# Patient Record
Sex: Male | Born: 1940 | Race: White | Hispanic: No | Marital: Married | Smoking: Former smoker
Health system: Southern US, Community
[De-identification: ages and names within clinical notes are randomized; demographics above are authoritative.]

## PROBLEM LIST (undated history)

## (undated) DIAGNOSIS — I6529 Occlusion and stenosis of unspecified carotid artery: Secondary | ICD-10-CM

## (undated) DIAGNOSIS — I493 Ventricular premature depolarization: Secondary | ICD-10-CM

## (undated) DIAGNOSIS — E785 Hyperlipidemia, unspecified: Secondary | ICD-10-CM

## (undated) DIAGNOSIS — I350 Nonrheumatic aortic (valve) stenosis: Secondary | ICD-10-CM

## (undated) DIAGNOSIS — Z950 Presence of cardiac pacemaker: Secondary | ICD-10-CM

## (undated) DIAGNOSIS — G4733 Obstructive sleep apnea (adult) (pediatric): Secondary | ICD-10-CM

## (undated) DIAGNOSIS — E876 Hypokalemia: Secondary | ICD-10-CM

## (undated) DIAGNOSIS — E669 Obesity, unspecified: Secondary | ICD-10-CM

## (undated) DIAGNOSIS — K219 Gastro-esophageal reflux disease without esophagitis: Secondary | ICD-10-CM

## (undated) DIAGNOSIS — I10 Essential (primary) hypertension: Secondary | ICD-10-CM

## (undated) DIAGNOSIS — E119 Type 2 diabetes mellitus without complications: Secondary | ICD-10-CM

## (undated) DIAGNOSIS — I2510 Atherosclerotic heart disease of native coronary artery without angina pectoris: Secondary | ICD-10-CM

## (undated) DIAGNOSIS — R7301 Impaired fasting glucose: Secondary | ICD-10-CM

## (undated) DIAGNOSIS — I480 Paroxysmal atrial fibrillation: Secondary | ICD-10-CM

## (undated) DIAGNOSIS — K922 Gastrointestinal hemorrhage, unspecified: Secondary | ICD-10-CM

## (undated) DIAGNOSIS — R040 Epistaxis: Secondary | ICD-10-CM

## (undated) DIAGNOSIS — N189 Chronic kidney disease, unspecified: Secondary | ICD-10-CM

## (undated) DIAGNOSIS — M1990 Unspecified osteoarthritis, unspecified site: Secondary | ICD-10-CM

## (undated) DIAGNOSIS — I519 Heart disease, unspecified: Secondary | ICD-10-CM

## (undated) DIAGNOSIS — R55 Syncope and collapse: Secondary | ICD-10-CM

## (undated) HISTORY — DX: Hypokalemia: E87.6

## (undated) HISTORY — DX: Impaired fasting glucose: R73.01

## (undated) HISTORY — DX: Paroxysmal atrial fibrillation: I48.0

## (undated) HISTORY — PX: CHOLECYSTECTOMY: SHX55

## (undated) HISTORY — DX: Obstructive sleep apnea (adult) (pediatric): G47.33

## (undated) HISTORY — DX: Type 2 diabetes mellitus without complications: E11.9

## (undated) HISTORY — DX: Gastro-esophageal reflux disease without esophagitis: K21.9

## (undated) HISTORY — DX: Unspecified osteoarthritis, unspecified site: M19.90

## (undated) HISTORY — DX: Syncope and collapse: R55

## (undated) HISTORY — DX: Hypomagnesemia: E83.42

## (undated) HISTORY — DX: Chronic kidney disease, unspecified: N18.9

## (undated) HISTORY — DX: Presence of cardiac pacemaker: Z95.0

## (undated) HISTORY — DX: Obesity, unspecified: E66.9

## (undated) HISTORY — DX: Heart disease, unspecified: I51.9

## (undated) HISTORY — DX: Ventricular premature depolarization: I49.3

## (undated) HISTORY — DX: Essential (primary) hypertension: I10

## (undated) HISTORY — DX: Occlusion and stenosis of unspecified carotid artery: I65.29

## (undated) HISTORY — DX: Atherosclerotic heart disease of native coronary artery without angina pectoris: I25.10

## (undated) HISTORY — DX: Hyperlipidemia, unspecified: E78.5

## (undated) HISTORY — DX: Nonrheumatic aortic (valve) stenosis: I35.0

---

## 1995-05-30 HISTORY — PX: ANGIOPLASTY: SHX39

## 1998-05-29 HISTORY — PX: KNEE ARTHROSCOPY: SUR90

## 2000-05-29 HISTORY — PX: PACEMAKER INSERTION: SHX728

## 2001-01-25 ENCOUNTER — Ambulatory Visit (HOSPITAL_COMMUNITY): Admission: RE | Admit: 2001-01-25 | Discharge: 2001-01-26 | Payer: Self-pay | Admitting: Cardiology

## 2001-02-04 ENCOUNTER — Ambulatory Visit (HOSPITAL_COMMUNITY): Admission: AD | Admit: 2001-02-04 | Discharge: 2001-02-04 | Payer: Self-pay | Admitting: Cardiology

## 2001-02-21 ENCOUNTER — Ambulatory Visit (HOSPITAL_COMMUNITY): Admission: RE | Admit: 2001-02-21 | Discharge: 2001-02-22 | Payer: Self-pay | Admitting: Cardiology

## 2001-02-22 ENCOUNTER — Encounter: Payer: Self-pay | Admitting: Cardiology

## 2002-01-14 ENCOUNTER — Ambulatory Visit (HOSPITAL_COMMUNITY): Admission: RE | Admit: 2002-01-14 | Discharge: 2002-01-14 | Payer: Self-pay | Admitting: *Deleted

## 2002-01-14 ENCOUNTER — Encounter: Payer: Self-pay | Admitting: *Deleted

## 2002-03-03 ENCOUNTER — Ambulatory Visit (HOSPITAL_COMMUNITY): Admission: RE | Admit: 2002-03-03 | Discharge: 2002-03-03 | Payer: Self-pay | Admitting: Gastroenterology

## 2010-12-20 ENCOUNTER — Encounter: Payer: Self-pay | Admitting: *Deleted

## 2010-12-21 ENCOUNTER — Ambulatory Visit (INDEPENDENT_AMBULATORY_CARE_PROVIDER_SITE_OTHER): Payer: Managed Care, Other (non HMO) | Admitting: Internal Medicine

## 2010-12-21 ENCOUNTER — Encounter: Payer: Self-pay | Admitting: Internal Medicine

## 2010-12-21 VITALS — BP 144/82 | HR 88 | Ht 68.0 in | Wt 282.0 lb

## 2010-12-21 DIAGNOSIS — I495 Sick sinus syndrome: Secondary | ICD-10-CM

## 2010-12-21 DIAGNOSIS — Z950 Presence of cardiac pacemaker: Secondary | ICD-10-CM

## 2010-12-21 DIAGNOSIS — I2510 Atherosclerotic heart disease of native coronary artery without angina pectoris: Secondary | ICD-10-CM

## 2010-12-21 DIAGNOSIS — R001 Bradycardia, unspecified: Secondary | ICD-10-CM | POA: Insufficient documentation

## 2010-12-21 DIAGNOSIS — I498 Other specified cardiac arrhythmias: Secondary | ICD-10-CM

## 2010-12-21 DIAGNOSIS — R55 Syncope and collapse: Secondary | ICD-10-CM | POA: Insufficient documentation

## 2010-12-21 LAB — PACEMAKER DEVICE OBSERVATION
BATTERY VOLTAGE: 2.6 V
BMOD-0005RV: 95 {beats}/min
RV LEAD IMPEDENCE PM: 738 Ohm

## 2010-12-21 NOTE — Progress Notes (Signed)
HPI: Brandon Klein is a 70 y.o. male Seen at the request of Dr. [name] for consideration of pacemaker generator replacement  He underwent pacemaker implantation in 2002 because of bradycardia attributed to vasovagal syncope. He has had no syncope since then.  He has a history of coronary disease and underwent  LAD stenting in 1997 or so and Cutting balloon angioplasty of the circumflex in 2002. Most recent assessment of left ventricular function by echo June 2012 and is normal.  Outside recoreds were reviewed   He denies significant problems with chest pain shortness of breath or peripheral edema. Current Outpatient Prescriptions  Medication Sig Dispense Refill  . amLODipine (NORVASC) 10 MG tablet Take 10 mg by mouth daily.        Marland Kitchen aspirin 325 MG tablet Take 325 mg by mouth daily.        Marland Kitchen atorvastatin (LIPITOR) 20 MG tablet Take 20 mg by mouth daily.        . cholecalciferol (GNP VITAMIN D) 400 UNITS TABS Take 400 Units by mouth daily.        . fexofenadine (ALLEGRA) 180 MG tablet Take 180 mg by mouth daily.        . fluticasone (FLONASE) 50 MCG/ACT nasal spray Place 2 sprays into the nose daily.        . Glucosamine-Chondroit-Vit C-Mn (GLUCOSAMINE CHONDR 1500 COMPLX) CAPS Take by mouth.        Marland Kitchen ibuprofen (ADVIL,MOTRIN) 200 MG tablet Take 200 mg by mouth every 6 (six) hours as needed.        Marland Kitchen L-Lysine 500 MG CAPS Take 1 capsule by mouth daily.        . meclizine (ANTIVERT) 25 MG tablet Take 25 mg by mouth 3 (three) times daily as needed.        . Multiple Vitamin (MULTIVITAMIN) tablet Take 1 tablet by mouth daily.        . nitroGLYCERIN (NITROSTAT) 0.4 MG SL tablet Place 0.4 mg under the tongue every 5 (five) minutes as needed.        Marland Kitchen omega-3 acid ethyl esters (LOVAZA) 1 G capsule Take 2 g by mouth 2 (two) times daily.        . pantoprazole (PROTONIX) 40 MG tablet Take 40 mg by mouth daily.        . potassium chloride SA (K-DUR,KLOR-CON) 20 MEQ tablet Take 20 mEq by mouth daily.         . trandolapril (MAVIK) 4 MG tablet Take 4 mg by mouth daily.        Marland Kitchen triamterene-hydrochlorothiazide (MAXZIDE) 75-50 MG per tablet Take 1 tablet by mouth daily.          Allergies  Allergen Reactions  . Amoxicillin   . Diovan (Valsartan)     Past Medical History  Diagnosis Date  . Coronary artery disease     s/p angioplasty, 2 vessel, PTCA/LCX 12/2000, NL Cardiolite 10/08 (Dr. Amil Amen) Bradycardia  . Bradycardia   . Pacemaker   . Obesity   . HTN (hypertension)   . HLD (hyperlipidemia)   . DJD (degenerative joint disease)   . GERD (gastroesophageal reflux disease)   . Dizziness   . Impaired fasting glucose     A1C check every year    Past Surgical History  Procedure Date  . Cholecystectomy   . Knee arthroscopy 2000    Left  . Angioplasty 1997  . Pacemaker insertion 2002    Family History  Problem Relation Age  of Onset  . Leukemia Father     deceased  . Colon cancer Mother     deceased  . Heart attack Mother   . Heart attack Brother     deceased    History   Social History  . Marital Status: Married    Spouse Name: N/A    Number of Children: N/A  . Years of Education: N/A   Occupational History  . Not on file.   Social History Main Topics  . Smoking status: Former Smoker    Quit date: 05/29/1966  . Smokeless tobacco: Not on file  . Alcohol Use: Not on file  . Drug Use: Not on file  . Sexually Active: Not on file   Other Topics Concern  . Not on file   Social History Narrative  . No narrative on file    Fourteen point review of systems was negative except as noted in HPI and PMH   PHYSICAL EXAMINATION  Blood pressure 144/82, pulse 88, height 5\' 8"  (1.727 m), weight 282 lb (127.914 kg).   Well developed and nourished obese older male in no acute distress HENT normal Neck supple with JVP-flat Carotids brisk and full without bruits Back without scoliosis or kyphosis Pacer pocket well healed  Clear Regular rate and rhythm, no murmurs  or gallops Abd-soft with active BS without hepatomegaly or midline pulsation; cholecystectomy scar Femoral pulses 2+ distal pulses intact No Clubbing cyanosis edema Skin-warm and dry LN-neg submandibular and supraclavicular A & Oriented CN 3-12 normal  Grossly normal sensory and motor function Affect engaging .

## 2010-12-21 NOTE — Assessment & Plan Note (Signed)
Presumably neurally mediated he does very little pacing

## 2010-12-21 NOTE — Assessment & Plan Note (Signed)
Stable on current medications; he is not on a beta blocker

## 2010-12-21 NOTE — Assessment & Plan Note (Signed)
No recurrent syncope since pacemaker was implanted

## 2010-12-21 NOTE — Assessment & Plan Note (Signed)
Devices reached replacement indicators. We have discussed the potential benefits and risks including but not limited to infection and lead fracture. He understands these risks and is willing to proceed

## 2010-12-21 NOTE — Patient Instructions (Signed)
Your physician has recommended that you have a pacemaker generator change. We will look at scheduling you for this on Wed. 02/01/11 with Dr. Graciela Husbands at [facility]. Dr. Odessa Fleming nurse, Herbert Seta, will call you the first or second week of August to confirm this.  Your physician recommends that you return for lab work the week before your procedure. We will look at doing this on Friday 01/27/11.

## 2010-12-28 ENCOUNTER — Institutional Professional Consult (permissible substitution): Payer: Self-pay | Admitting: Internal Medicine

## 2011-01-18 ENCOUNTER — Telehealth: Payer: Self-pay | Admitting: *Deleted

## 2011-01-18 ENCOUNTER — Encounter: Payer: Self-pay | Admitting: *Deleted

## 2011-01-18 DIAGNOSIS — R001 Bradycardia, unspecified: Secondary | ICD-10-CM

## 2011-01-18 NOTE — Telephone Encounter (Signed)
I spoke with the patient's wife regarding his PPM generator change instructions. He is scheduled for 02/01/11 with Dr. Graciela Husbands. He will come on 8/31 for labs. I will mail a copy of the instructions to the patient. Mrs. Koury verbalizes understanding.

## 2011-01-27 ENCOUNTER — Encounter: Payer: Self-pay | Admitting: *Deleted

## 2011-01-27 ENCOUNTER — Other Ambulatory Visit (INDEPENDENT_AMBULATORY_CARE_PROVIDER_SITE_OTHER): Payer: Managed Care, Other (non HMO) | Admitting: *Deleted

## 2011-01-27 DIAGNOSIS — I2510 Atherosclerotic heart disease of native coronary artery without angina pectoris: Secondary | ICD-10-CM

## 2011-01-27 DIAGNOSIS — I498 Other specified cardiac arrhythmias: Secondary | ICD-10-CM

## 2011-01-27 DIAGNOSIS — R001 Bradycardia, unspecified: Secondary | ICD-10-CM

## 2011-01-27 LAB — CBC WITH DIFFERENTIAL/PLATELET
Basophils Absolute: 0.1 10*3/uL (ref 0.0–0.1)
Eosinophils Absolute: 0.3 10*3/uL (ref 0.0–0.7)
Eosinophils Relative: 4.4 % (ref 0.0–5.0)
HCT: 45.8 % (ref 39.0–52.0)
Lymphs Abs: 1.1 10*3/uL (ref 0.7–4.0)
MCHC: 34.1 g/dL (ref 30.0–36.0)
MCV: 89.7 fl (ref 78.0–100.0)
Monocytes Absolute: 0.5 10*3/uL (ref 0.1–1.0)
Neutrophils Relative %: 68.5 % (ref 43.0–77.0)
Platelets: 156 10*3/uL (ref 150.0–400.0)
RDW: 13.4 % (ref 11.5–14.6)

## 2011-01-27 LAB — PROTIME-INR
INR: 0.9 ratio (ref 0.8–1.0)
Prothrombin Time: 10.6 s (ref 10.2–12.4)

## 2011-01-27 LAB — BASIC METABOLIC PANEL
BUN: 24 mg/dL — ABNORMAL HIGH (ref 6–23)
CO2: 28 mEq/L (ref 19–32)
Chloride: 105 mEq/L (ref 96–112)
Creatinine, Ser: 1.1 mg/dL (ref 0.4–1.5)
Glucose, Bld: 168 mg/dL — ABNORMAL HIGH (ref 70–99)
Potassium: 3.3 mEq/L — ABNORMAL LOW (ref 3.5–5.1)

## 2011-02-01 ENCOUNTER — Ambulatory Visit (HOSPITAL_COMMUNITY)
Admission: RE | Admit: 2011-02-01 | Discharge: 2011-02-01 | Disposition: A | Discharge status: Home / Self Care | Payer: Managed Care, Other (non HMO) | Source: Ambulatory Visit | Attending: Internal Medicine | Admitting: Internal Medicine

## 2011-02-01 ENCOUNTER — Ambulatory Visit (HOSPITAL_COMMUNITY): Payer: Managed Care, Other (non HMO)

## 2011-02-01 DIAGNOSIS — R55 Syncope and collapse: Secondary | ICD-10-CM | POA: Insufficient documentation

## 2011-02-01 DIAGNOSIS — I2510 Atherosclerotic heart disease of native coronary artery without angina pectoris: Secondary | ICD-10-CM | POA: Insufficient documentation

## 2011-02-01 DIAGNOSIS — Z45018 Encounter for adjustment and management of other part of cardiac pacemaker: Secondary | ICD-10-CM | POA: Insufficient documentation

## 2011-02-09 ENCOUNTER — Ambulatory Visit (INDEPENDENT_AMBULATORY_CARE_PROVIDER_SITE_OTHER): Payer: Managed Care, Other (non HMO) | Admitting: *Deleted

## 2011-02-09 ENCOUNTER — Encounter: Payer: Self-pay | Admitting: Internal Medicine

## 2011-02-09 DIAGNOSIS — I498 Other specified cardiac arrhythmias: Secondary | ICD-10-CM

## 2011-02-09 DIAGNOSIS — Z950 Presence of cardiac pacemaker: Secondary | ICD-10-CM

## 2011-02-09 DIAGNOSIS — R55 Syncope and collapse: Secondary | ICD-10-CM

## 2011-02-09 DIAGNOSIS — R001 Bradycardia, unspecified: Secondary | ICD-10-CM

## 2011-02-09 LAB — PACEMAKER DEVICE OBSERVATION
AL IMPEDENCE PM: 443 Ohm
AL THRESHOLD: 0.5 V
ATRIAL PACING PM: 17.9
VENTRICULAR PACING PM: 1.5

## 2011-02-09 NOTE — Progress Notes (Signed)
Pacer check in clinic  

## 2011-02-20 NOTE — Op Note (Signed)
  NAME:  Brandon Klein, Brandon Klein NO.:  [id number]  MEDICAL RECORD NO.:  [id number]  LOCATION:  [facility]                         FACILITY:  [facility]  PHYSICIAN:  Duke Salvia, MD, FACCDATE OF BIRTH:  09-26-1940  DATE OF PROCEDURE:  02/01/2011 DATE OF DISCHARGE:                              OPERATIVE REPORT   PREOPERATIVE DIAGNOSIS:  Syncope with previously implanted pacemaker now at elective replacement indicator.  POSTOPERATIVE DIAGNOSIS:  Syncope with previously implanted pacemaker now at elective replacement indicator.  PROCEDURE:  Explantation of a previous device pocket revision and implantation of a new device.  Following obtaining informed consent, the patient was brought to the electrophysiology laboratory and placed on the fluoroscopic table in supine position.  After routine prep and drape of the left upper chest, lidocaine was infiltrated just below the line of the previous incision and carried down to the layer of the device pocket.  Using sharp dissection and electrocautery, the pocket was opened.  Thereafter, the device was freed up.  It had rotated 90 degrees counterclockwise. Thankfully, the leads freed up easily.  The pocket was extended inferomedially to allow for housing of the larger device.  Interrogation of the previously implanted ventricular lead which I marked with a tie which was a 5092 lead, serial number [id number] demonstrated an amplitude of 18.3 with a pace impedance of 714, threshold of 0.7 at 0.5 and current threshold was 1.0 mA.  The previously implanted atrial lead was a 5076, serial number [id number] demonstrated an amplitude of 5.0 with a pace impedance of 512, threshold 0.4 volts at 0.5 milliseconds.  Current threshold was 0.8 mA.  With these acceptable parameters recorded, hemostasis was obtained. The lead and the pulse generator were placed in the pocket and secured to the prepectoral fascia.  The wound was closed in three layers  in normal fashion.  The wound was washed, dried and a benzoin and Steri- Strip dressing was applied.  Needle counts, instrument counts were correct at the end of the procedure according to the staff.  The patient tolerated the procedure without apparent complication.     Duke Salvia, MD, [facility]     SCK/MEDQ  D:  02/01/2011  T:  02/01/2011  Job:  [id number]  Electronically Signed by Sherryl Manges MD [facility] on 02/20/2011 04:03:29 PM

## 2011-06-02 ENCOUNTER — Encounter: Payer: Self-pay | Admitting: Internal Medicine

## 2011-06-02 ENCOUNTER — Ambulatory Visit (INDEPENDENT_AMBULATORY_CARE_PROVIDER_SITE_OTHER): Payer: Medicare Other | Admitting: Internal Medicine

## 2011-06-02 DIAGNOSIS — I498 Other specified cardiac arrhythmias: Secondary | ICD-10-CM

## 2011-06-02 DIAGNOSIS — R001 Bradycardia, unspecified: Secondary | ICD-10-CM

## 2011-06-02 DIAGNOSIS — I2510 Atherosclerotic heart disease of native coronary artery without angina pectoris: Secondary | ICD-10-CM

## 2011-06-02 DIAGNOSIS — Z950 Presence of cardiac pacemaker: Secondary | ICD-10-CM

## 2011-06-02 DIAGNOSIS — R55 Syncope and collapse: Secondary | ICD-10-CM

## 2011-06-02 LAB — PACEMAKER DEVICE OBSERVATION
AL IMPEDENCE PM: 462 Ohm
AL THRESHOLD: 0.5 V
BATTERY VOLTAGE: 2.79 V
RV LEAD AMPLITUDE: 22.4 mv
RV LEAD IMPEDENCE PM: 789 Ohm

## 2011-06-02 NOTE — Assessment & Plan Note (Signed)
The patient's device was interrogated.  The information was reviewed. No changes were made in the programming.    

## 2011-06-02 NOTE — Progress Notes (Signed)
HPI  Brandon Klein is a 71 y.o. male Seen in followup for pacemaker generator replacement. He underwent pacemaker implantation in 2002 because of bradycardia attributed to vasovagal syncope. He has had no syncope since then Device was changed out September 2012 .  He has a history of coronary disease and underwent LAD stenting in 1997 or so and Cutting balloon angioplasty of the circumflex in 2002. Most recent assessment of left ventricular function by echo June 2012 and is normal.  Outside recoreds were reviewed   Past Medical History  Diagnosis Date  . Coronary artery disease     s/p angioplasty, 2 vessel,Stenting LAD 1990s PTCA/LCX 12/2000, NL Cardiolite 10/08   . Syncope     Vasovagal With documented bradycardia  . Pacemaker   . Obesity   . HTN (hypertension)   . HLD (hyperlipidemia)   . DJD (degenerative joint disease)   . GERD (gastroesophageal reflux disease)   . Impaired fasting glucose     A1C check every year    Past Surgical History  Procedure Date  . Cholecystectomy   . Knee arthroscopy 2000    Left  . Angioplasty 1997  . Pacemaker insertion 2002    Current Outpatient Prescriptions  Medication Sig Dispense Refill  . amLODipine (NORVASC) 10 MG tablet Take 10 mg by mouth daily.        Marland Kitchen aspirin 325 MG tablet Take 325 mg by mouth daily.        Marland Kitchen atorvastatin (LIPITOR) 20 MG tablet Take 20 mg by mouth daily.        . cholecalciferol (GNP VITAMIN D) 400 UNITS TABS Take 400 Units by mouth daily.        Marland Kitchen ezetimibe (ZETIA) 10 MG tablet Take 10 mg by mouth daily.        . fexofenadine (ALLEGRA) 180 MG tablet Take 180 mg by mouth daily.        . fluticasone (FLONASE) 50 MCG/ACT nasal spray Place 2 sprays into the nose daily.        . Glucosamine-Chondroit-Vit C-Mn (GLUCOSAMINE CHONDR 1500 COMPLX) CAPS Take by mouth.        Marland Kitchen ibuprofen (ADVIL,MOTRIN) 200 MG tablet Take 200 mg by mouth every 6 (six) hours as needed.        Marland Kitchen L-Lysine 500 MG CAPS Take 1 capsule by mouth  daily.        . meclizine (ANTIVERT) 25 MG tablet Take 25 mg by mouth 3 (three) times daily as needed.       . Multiple Vitamin (MULTIVITAMIN) tablet Take 1 tablet by mouth daily.        . nitroGLYCERIN (NITROSTAT) 0.4 MG SL tablet Place 0.4 mg under the tongue every 5 (five) minutes as needed.        Marland Kitchen omega-3 acid ethyl esters (LOVAZA) 1 G capsule Take 2 g by mouth 2 (two) times daily.        . pantoprazole (PROTONIX) 40 MG tablet Take 40 mg by mouth daily.        . potassium chloride SA (K-DUR,KLOR-CON) 20 MEQ tablet Take 20 mEq by mouth daily.        . trandolapril (MAVIK) 4 MG tablet Take 4 mg by mouth daily.        Marland Kitchen triamterene-hydrochlorothiazide (MAXZIDE) 75-50 MG per tablet Take 1 tablet by mouth daily.          Allergies  Allergen Reactions  . Amoxicillin   . Diovan (Valsartan)  Review of Systems negative except from HPI and PMH  Physical Exam Well developed and well nourished in no acute distress Device pocket well he ull Clear to ausculation Regular rate and rhythm, no murmurs gallops or rub Soft with active bowel sounds No clubbing cyanosis none Edema Alert and oriented, grossly normal motor and sensory function Skin Warm and Dry   Assessment and  Plan

## 2011-06-02 NOTE — Assessment & Plan Note (Signed)
20% atrially paced

## 2011-06-02 NOTE — Assessment & Plan Note (Signed)
No recurretn syncope

## 2013-03-06 ENCOUNTER — Ambulatory Visit (INDEPENDENT_AMBULATORY_CARE_PROVIDER_SITE_OTHER): Payer: Medicare Other | Admitting: Pharmacist

## 2013-03-06 DIAGNOSIS — I4891 Unspecified atrial fibrillation: Secondary | ICD-10-CM

## 2013-03-06 MED ORDER — WARFARIN SODIUM 5 MG PO TABS: ORAL_TABLET | ORAL | Status: DC

## 2013-03-06 MED ORDER — METOPROLOL SUCCINATE ER 50 MG PO TB24: 50.0000 mg | ORAL_TABLET | Freq: Every day | ORAL | Status: DC

## 2013-04-17 ENCOUNTER — Ambulatory Visit (INDEPENDENT_AMBULATORY_CARE_PROVIDER_SITE_OTHER): Payer: Medicare Other | Admitting: Pharmacist

## 2013-04-17 DIAGNOSIS — I4891 Unspecified atrial fibrillation: Secondary | ICD-10-CM

## 2013-06-03 ENCOUNTER — Ambulatory Visit (INDEPENDENT_AMBULATORY_CARE_PROVIDER_SITE_OTHER): Payer: Medicare Other | Admitting: Pharmacist

## 2013-06-03 DIAGNOSIS — I4891 Unspecified atrial fibrillation: Secondary | ICD-10-CM

## 2013-06-03 LAB — POCT INR: INR: 2.2

## 2013-07-08 ENCOUNTER — Ambulatory Visit (INDEPENDENT_AMBULATORY_CARE_PROVIDER_SITE_OTHER): Payer: Medicare Other

## 2013-07-08 ENCOUNTER — Ambulatory Visit (INDEPENDENT_AMBULATORY_CARE_PROVIDER_SITE_OTHER): Payer: Medicare Other | Admitting: Internal Medicine

## 2013-07-08 ENCOUNTER — Encounter: Payer: Self-pay | Admitting: Internal Medicine

## 2013-07-08 ENCOUNTER — Other Ambulatory Visit: Payer: Self-pay | Admitting: Pharmacist

## 2013-07-08 ENCOUNTER — Other Ambulatory Visit: Payer: Self-pay

## 2013-07-08 VITALS — BP 140/83 | HR 79 | Ht 68.0 in

## 2013-07-08 DIAGNOSIS — I2510 Atherosclerotic heart disease of native coronary artery without angina pectoris: Secondary | ICD-10-CM

## 2013-07-08 DIAGNOSIS — I4891 Unspecified atrial fibrillation: Secondary | ICD-10-CM

## 2013-07-08 DIAGNOSIS — Z950 Presence of cardiac pacemaker: Secondary | ICD-10-CM

## 2013-07-08 DIAGNOSIS — E785 Hyperlipidemia, unspecified: Secondary | ICD-10-CM | POA: Insufficient documentation

## 2013-07-08 DIAGNOSIS — I498 Other specified cardiac arrhythmias: Secondary | ICD-10-CM

## 2013-07-08 DIAGNOSIS — R001 Bradycardia, unspecified: Secondary | ICD-10-CM

## 2013-07-08 LAB — MDC_IDC_ENUM_SESS_TYPE_INCLINIC
Battery Impedance: 135 Ohm
Battery Remaining Longevity: 144 mo
Battery Voltage: 2.79 V
Brady Statistic AP VS Percent: 34 %
Brady Statistic AS VP Percent: 2 %
Date Time Interrogation Session: 20150210145206
Lead Channel Impedance Value: 482 Ohm
Lead Channel Pacing Threshold Amplitude: 0.75 V
Lead Channel Pacing Threshold Pulse Width: 0.4 ms
Lead Channel Pacing Threshold Pulse Width: 0.4 ms
Lead Channel Sensing Intrinsic Amplitude: 15.67 mV
Lead Channel Sensing Intrinsic Amplitude: 4 mV
Lead Channel Setting Pacing Amplitude: 2 V
Lead Channel Setting Pacing Amplitude: 2.5 V
Lead Channel Setting Pacing Pulse Width: 0.4 ms
Lead Channel Setting Sensing Sensitivity: 5.6 mV
MDC IDC MSMT LEADCHNL RA PACING THRESHOLD AMPLITUDE: 0.5 V
MDC IDC MSMT LEADCHNL RV IMPEDANCE VALUE: 755 Ohm
MDC IDC STAT BRADY AP VP PERCENT: 1 %
MDC IDC STAT BRADY AS VS PERCENT: 63 %

## 2013-07-08 LAB — POCT INR: INR: 3

## 2013-07-08 NOTE — Assessment & Plan Note (Signed)
We will continue warfarin. We will discontinue aspirin.

## 2013-07-08 NOTE — Assessment & Plan Note (Signed)
Stable  As above

## 2013-07-08 NOTE — Assessment & Plan Note (Signed)
Have reviewed As above  we'll discontinue ezetimibe

## 2013-07-08 NOTE — Assessment & Plan Note (Signed)
The patient's device was interrogated.  The information was reviewed. No changes were made in the programming.    

## 2013-07-08 NOTE — Progress Notes (Signed)
Patient Care Team: Gaye AlkenElizabeth Stewart Barnes, MD as PCP - General (Family Medicine)   HPI  Brandon Klein is a 73 y.o. male Seen in followup for pacemaker implanted in 2002 with generator replacement 2012. He was implanted initially for bradycardia attributed to vasovagal syncope.  He has a history of coronary disease and underwent stenting of his LAD 1997 and cutting balloon angioplasty of the circumflex in 2002. Echocardiogram 2013 demonstrated normal left ventricular function. Myoview 2008 with nonischemic  He has a history of PAF. He is on Coumadin. He is also on aspirin.  His lipids were reviewed he has low-density particles. His LDL was 35. I have reviewed this with Dr. Florina OuT and Alfonse RasJeremy Smart.    He denies snoring as does have some daytime somnolence  Past Medical History  Diagnosis Date  . Coronary artery disease     s/p angioplasty, 2 vessel,Stenting LAD 1990s PTCA/LCX 12/2000, NL Cardiolite 10/08   . Syncope     Vasovagal With documented bradycardia  . Pacemaker   . Obesity   . HTN (hypertension)   . HLD (hyperlipidemia)   . DJD (degenerative joint disease)   . GERD (gastroesophageal reflux disease)   . Impaired fasting glucose     A1C check every year    Past Surgical History  Procedure Laterality Date  . Cholecystectomy    . Knee arthroscopy  2000    Left  . Angioplasty  1997  . Pacemaker insertion  2002    Current Outpatient Prescriptions  Medication Sig Dispense Refill  . amLODipine (NORVASC) 10 MG tablet Take 10 mg by mouth daily.        Marland Kitchen. aspirin 81 MG tablet Take 81 mg by mouth daily.      Marland Kitchen. atorvastatin (LIPITOR) 20 MG tablet Take 20 mg by mouth daily.        . cholecalciferol (GNP VITAMIN D) 400 UNITS TABS Take 400 Units by mouth daily.        Marland Kitchen. ezetimibe (ZETIA) 10 MG tablet Take 10 mg by mouth daily.        . fexofenadine (ALLEGRA) 180 MG tablet Take 180 mg by mouth daily.        . fluticasone (FLONASE) 50 MCG/ACT nasal spray Place 2 sprays  into the nose daily.        . Glucosamine-Chondroit-Vit C-Mn (GLUCOSAMINE CHONDR 1500 COMPLX) CAPS Take 1 capsule by mouth daily.       Marland Kitchen. L-Lysine 500 MG CAPS Take 1 capsule by mouth daily.        . meclizine (ANTIVERT) 25 MG tablet Take 25 mg by mouth 3 (three) times daily as needed.       . metoprolol succinate (TOPROL-XL) 50 MG 24 hr tablet Take 1 tablet (50 mg total) by mouth daily. Take with or immediately following a meal.  90 tablet  3  . Multiple Vitamin (MULTIVITAMIN) tablet Take 1 tablet by mouth daily.        . nitroGLYCERIN (NITROSTAT) 0.4 MG SL tablet Place 0.4 mg under the tongue every 5 (five) minutes as needed.        Marland Kitchen. omega-3 acid ethyl esters (LOVAZA) 1 G capsule Take 2 g by mouth 2 (two) times daily.        . pantoprazole (PROTONIX) 40 MG tablet Take 40 mg by mouth daily.        . potassium chloride SA (K-DUR,KLOR-CON) 20 MEQ tablet Take 20 mEq by mouth daily.        .Marland Kitchen  trandolapril (MAVIK) 4 MG tablet Take 4 mg by mouth daily.        Marland Kitchen triamterene-hydrochlorothiazide (MAXZIDE) 75-50 MG per tablet Take 1 tablet by mouth daily.        Marland Kitchen warfarin (COUMADIN) 5 MG tablet Take warfarin as directed by [facility]  130 tablet  1   No current facility-administered medications for this visit.    Allergies  Allergen Reactions  . Amoxicillin   . Diovan [Valsartan]     Review of Systems negative except from HPI and PMH  Physical Exam BP 140/83  Pulse 79  Ht 5\' 8"  (1.727 m) Well developed and nourished in no acute distress HENT normal Neck supple with JVP-flat Clear Regular rate and rhythm, no murmurs or gallops Abd-soft with active BS No Clubbing cyanosis edema Skin-warm and dry A & Oriented  Grossly normal sensory and motor function    Assessment and  Plan

## 2013-07-08 NOTE — Patient Instructions (Signed)
Your physician has recommended you make the following change in your medication:  1) Stop Aspirin 2) Stop Zetia  Remote monitoring is used to monitor your Pacemaker of ICD from home. This monitoring reduces the number of office visits required to check your device to one time per year. It allows [place] to keep an eye on the functioning of your device to ensure it is working properly. You are scheduled for a device check from home on 10/09/13. You may send your transmission at any time that day. If you have a wireless device, the transmission will be sent automatically. After your physician reviews your transmission, you will receive a postcard with your next transmission date.  Your physician wants you to follow-up in: 1 year with Dr. Graciela Husbands.  You will receive a reminder letter in the mail two months in advance. If you don't receive a letter, please call our office to schedule the follow-up appointment.

## 2013-07-14 ENCOUNTER — Ambulatory Visit (INDEPENDENT_AMBULATORY_CARE_PROVIDER_SITE_OTHER): Payer: Medicare Other | Admitting: *Deleted

## 2013-07-14 DIAGNOSIS — I2510 Atherosclerotic heart disease of native coronary artery without angina pectoris: Secondary | ICD-10-CM

## 2013-07-14 DIAGNOSIS — I4891 Unspecified atrial fibrillation: Secondary | ICD-10-CM

## 2013-07-14 DIAGNOSIS — I498 Other specified cardiac arrhythmias: Secondary | ICD-10-CM

## 2013-07-14 DIAGNOSIS — R001 Bradycardia, unspecified: Secondary | ICD-10-CM

## 2013-07-14 LAB — LDL CHOLESTEROL, DIRECT: LDL DIRECT: 47.9 mg/dL

## 2013-07-14 LAB — LIPID PANEL
CHOL/HDL RATIO: 3
Cholesterol: 116 mg/dL (ref 0–200)
HDL: 36.1 mg/dL — ABNORMAL LOW (ref >39.00)
Triglycerides: 201 mg/dL — ABNORMAL HIGH (ref 0.0–149.0)
VLDL: 40.2 mg/dL — ABNORMAL HIGH (ref 0.0–40.0)

## 2013-07-14 LAB — ALT: ALT: 30 U/L (ref 0–53)

## 2013-07-16 ENCOUNTER — Other Ambulatory Visit: Payer: Self-pay | Admitting: General Surgery

## 2013-07-16 ENCOUNTER — Encounter: Payer: Self-pay | Admitting: General Surgery

## 2013-07-16 DIAGNOSIS — E785 Hyperlipidemia, unspecified: Secondary | ICD-10-CM

## 2013-08-19 ENCOUNTER — Ambulatory Visit (INDEPENDENT_AMBULATORY_CARE_PROVIDER_SITE_OTHER): Payer: Medicare Other | Admitting: Pharmacist Clinician (PhC)/ Clinical Pharmacy Specialist

## 2013-08-19 DIAGNOSIS — I4891 Unspecified atrial fibrillation: Secondary | ICD-10-CM

## 2013-08-19 LAB — POCT INR: INR: 2.1

## 2013-09-30 ENCOUNTER — Ambulatory Visit (INDEPENDENT_AMBULATORY_CARE_PROVIDER_SITE_OTHER): Payer: Medicare Other

## 2013-09-30 DIAGNOSIS — I4891 Unspecified atrial fibrillation: Secondary | ICD-10-CM

## 2013-09-30 LAB — POCT INR: INR: 2.4

## 2013-09-30 MED ORDER — WARFARIN SODIUM 5 MG PO TABS: ORAL_TABLET | ORAL | Status: DC

## 2013-10-09 ENCOUNTER — Telehealth: Payer: Self-pay | Admitting: Cardiology

## 2013-10-09 ENCOUNTER — Ambulatory Visit (INDEPENDENT_AMBULATORY_CARE_PROVIDER_SITE_OTHER): Payer: Medicare Other | Admitting: *Deleted

## 2013-10-09 DIAGNOSIS — I498 Other specified cardiac arrhythmias: Secondary | ICD-10-CM

## 2013-10-09 DIAGNOSIS — R001 Bradycardia, unspecified: Secondary | ICD-10-CM

## 2013-10-09 DIAGNOSIS — I4891 Unspecified atrial fibrillation: Secondary | ICD-10-CM

## 2013-10-09 LAB — MDC_IDC_ENUM_SESS_TYPE_REMOTE
Battery Voltage: 2.79 V
Brady Statistic AP VP Percent: 1 %
Brady Statistic AP VS Percent: 33 %
Brady Statistic AS VP Percent: 5 %
Date Time Interrogation Session: 20150514170542
Lead Channel Impedance Value: 474 Ohm
Lead Channel Pacing Threshold Amplitude: 0.5 V
Lead Channel Pacing Threshold Amplitude: 1.125 V
Lead Channel Pacing Threshold Pulse Width: 0.4 ms
Lead Channel Sensing Intrinsic Amplitude: 16 mV
Lead Channel Sensing Intrinsic Amplitude: 2.8 mV
Lead Channel Setting Pacing Amplitude: 2 V
Lead Channel Setting Pacing Amplitude: 2.5 V
Lead Channel Setting Pacing Pulse Width: 0.4 ms
MDC IDC MSMT BATTERY IMPEDANCE: 135 Ohm
MDC IDC MSMT BATTERY REMAINING LONGEVITY: 143 mo
MDC IDC MSMT LEADCHNL RV IMPEDANCE VALUE: 857 Ohm
MDC IDC MSMT LEADCHNL RV PACING THRESHOLD PULSEWIDTH: 0.4 ms
MDC IDC SET LEADCHNL RV SENSING SENSITIVITY: 5.6 mV
MDC IDC STAT BRADY AS VS PERCENT: 60 %

## 2013-10-09 NOTE — Progress Notes (Signed)
Remote pacemaker transmission.   

## 2013-10-09 NOTE — Telephone Encounter (Signed)
Spoke with pt and reminded pt of remote transmission that is due today. Pt verbalized understanding.   

## 2013-10-31 ENCOUNTER — Encounter: Payer: Self-pay | Admitting: Cardiology

## 2013-10-31 ENCOUNTER — Ambulatory Visit (INDEPENDENT_AMBULATORY_CARE_PROVIDER_SITE_OTHER): Payer: Medicare Other

## 2013-10-31 ENCOUNTER — Ambulatory Visit (INDEPENDENT_AMBULATORY_CARE_PROVIDER_SITE_OTHER): Payer: Medicare Other | Admitting: Cardiology

## 2013-10-31 ENCOUNTER — Ambulatory Visit: Payer: Medicare Other | Admitting: Cardiology

## 2013-10-31 VITALS — BP 150/94 | HR 68 | Ht 67.5 in | Wt 276.0 lb

## 2013-10-31 DIAGNOSIS — I2510 Atherosclerotic heart disease of native coronary artery without angina pectoris: Secondary | ICD-10-CM

## 2013-10-31 DIAGNOSIS — I350 Nonrheumatic aortic (valve) stenosis: Secondary | ICD-10-CM

## 2013-10-31 DIAGNOSIS — I480 Paroxysmal atrial fibrillation: Secondary | ICD-10-CM | POA: Insufficient documentation

## 2013-10-31 DIAGNOSIS — E785 Hyperlipidemia, unspecified: Secondary | ICD-10-CM

## 2013-10-31 DIAGNOSIS — R079 Chest pain, unspecified: Secondary | ICD-10-CM

## 2013-10-31 DIAGNOSIS — I498 Other specified cardiac arrhythmias: Secondary | ICD-10-CM

## 2013-10-31 DIAGNOSIS — I359 Nonrheumatic aortic valve disorder, unspecified: Secondary | ICD-10-CM

## 2013-10-31 DIAGNOSIS — I4891 Unspecified atrial fibrillation: Secondary | ICD-10-CM

## 2013-10-31 DIAGNOSIS — R011 Cardiac murmur, unspecified: Secondary | ICD-10-CM

## 2013-10-31 DIAGNOSIS — I10 Essential (primary) hypertension: Secondary | ICD-10-CM

## 2013-10-31 LAB — POCT INR: INR: 2.2

## 2013-10-31 NOTE — Progress Notes (Addendum)
Patient ID: Brandon Klein, male   DOB: 08-17-1940, 73 y.o.   MRN: [id number]  [address] [place], [place]  [id number] Phone: [phone] Fax:  [phone]  Date:  10/31/2013   ID:  Brandon AhmadiJames F Klein, DOB 08-17-1940, MRN [id number]  PCP:  Gaye AlkenBARNES,ELIZABETH STEWART, MD  Cardiologist:  Armanda Magicraci Turner, MD    History of Present Illness: Brandon Klein is a 73 y.o. male presenting for yearly follow-up.  Patient notes in mid march in [place] playing golf. Long walks to his golf ball. Got short of breath with this. 2 days later had a cold with chest congestion.   Then patient notes in mid April being exhausted while at his sons wedding. On a Sunday he was packing cars. He had shortness of breath and tightness in his chest while loading the car. States he was as tired as he's ever been that day.   Later that week cut grass and had tightness in chest and shortness of breath. Had BM and then went back out to cut the grass and had no more pain.   Since that time has not had recurrence of tightness or shortness of breath.   The tightness was in middle of chest. No radiation. He had shortness of breath with this. No diaphoresis. Palpitations are no more than usual. No dizziness or syncope.   Notes last stress test was in 10/08, normal cardiolite. Last stent LAD 1997. Last cath 12/2000.   He reports at his last PCP visit, his systolic blood pressure was 118.   Wt Readings from Last 3 Encounters:  10/31/13 276 lb (125.193 kg)  06/02/11 284 lb 6.4 oz (129.003 kg)  12/21/10 282 lb (127.914 kg)     Past Medical History  Diagnosis Date  . Syncope     Vasovagal With documented bradycardia  . Pacemaker   . Obesity   . HTN (hypertension)   . HLD (hyperlipidemia)   . DJD (degenerative joint disease)   . GERD (gastroesophageal reflux disease)   . Impaired fasting glucose     A1C check every year  . PAF (paroxysmal atrial fibrillation)     on chronic systemic anticoagulation  . Coronary  artery disease     s/p PCI of LAD 1997 and cutting balloon angioplasty to the left circ in 2002    Current Outpatient Prescriptions  Medication Sig Dispense Refill  . amLODipine (NORVASC) 10 MG tablet Take 10 mg by mouth daily.        Marland Kitchen. atorvastatin (LIPITOR) 20 MG tablet Take 20 mg by mouth daily.        . cholecalciferol (GNP VITAMIN D) 400 UNITS TABS Take 400 Units by mouth daily.        . fexofenadine (ALLEGRA) 180 MG tablet Take 180 mg by mouth daily.        . fluticasone (FLONASE) 50 MCG/ACT nasal spray Place 2 sprays into the nose daily.        . Glucosamine-Chondroit-Vit C-Mn (GLUCOSAMINE CHONDR 1500 COMPLX) CAPS Take 1 capsule by mouth daily.       Marland Kitchen. L-Lysine 500 MG CAPS Take 1 capsule by mouth daily.        . meclizine (ANTIVERT) 25 MG tablet Take 25 mg by mouth 3 (three) times daily as needed.       . Multiple Vitamin (MULTIVITAMIN) tablet Take 1 tablet by mouth daily.        . nitroGLYCERIN (NITROSTAT) 0.4 MG SL tablet Place 0.4  mg under the tongue every 5 (five) minutes as needed.        . Omega-3 Fatty Acids (FISH OIL) 500 MG CAPS Take 1,000 mg by mouth 2 (two) times daily.      . pantoprazole (PROTONIX) 40 MG tablet Take 40 mg by mouth daily.        . potassium chloride SA (K-DUR,KLOR-CON) 20 MEQ tablet Take 20 mEq by mouth daily.        . trandolapril (MAVIK) 4 MG tablet Take 4 mg by mouth daily.        Marland Kitchen triamterene-hydrochlorothiazide (MAXZIDE) 75-50 MG per tablet Take 1 tablet by mouth daily.        Marland Kitchen warfarin (COUMADIN) 5 MG tablet Take warfarin as directed by [facility]  130 tablet  1  . metoprolol succinate (TOPROL-XL) 50 MG 24 hr tablet Take 1 tablet (50 mg total) by mouth daily. Take with or immediately following a meal.  90 tablet  3   No current facility-administered medications for this visit.    Allergies:    Allergies  Allergen Reactions  . Amoxicillin   . Diovan [Valsartan]     Social History:  The patient  reports that he quit smoking about 47  years ago. He does not have any smokeless tobacco history on file.   Family History:  The patient's family history includes Colon cancer in his mother; Heart attack in his brother and mother; Leukemia in his father.   ROS:  Please see the history of present illness.   All other systems reviewed and negative.   PHYSICAL EXAM: VS:  BP 150/94  Pulse 68  Ht 5' 7.5" (1.715 m)  Wt 276 lb (125.193 kg)  BMI 42.56 kg/m2 Well nourished, well developed, in no acute distress HEENT: normal Neck: no JVD Cardiac:  normal S1, S2; RRR; 2/6 early peaking systolic murmur Lungs:  clear to auscultation bilaterally, no wheezing, rhonchi or rales Abd: soft, nontender, no hepatomegaly Ext: no edema Skin: warm and dry Neuro:  CNs 2-12 intact, no focal abnormalities noted  EKG:  Sinus rhythm with 1st degree block     ASSESSMENT AND PLAN:  1. CAD with stenting of the LAD in 1197 and cutting balloon angioplasty of the left circ in 2002- patient with complaint of chest tightness recently. Given history will plan for exercise stress test to evaluate for progression of CAD. 2. HTN- slightly elevated today, though per patient report was in normal range at last PCP visit. Will continue amlodipine 10 mg daily, mavik 4 mg daily, maxzide 75-50 daily, and toprol XL 50 mg daily. 3. HLD- will continue lipitor 20 mg daily 4. Systolic murmur- early peaking noted on exam today. Last echo noted to showed mild aortic stenosis 2013. Will repeat echo to evaluate for progression of stenosis 5. Atrail fibrillation- in sinus rhythm. To continue toprol XL 50 mg daily and coumadin 5 mg 1.5 tabs 4 days a week and 1 tab 3 days a week. 6. Follow-up in one year if echo and stress test are normal.  Signed, Marikay Alar, MD 10/31/2013 2:02 PM   Patient's chart reviewed, seen, interviewed and examined and agree with note as outlined above.  He has a history of CAD with no recent noninvasive testing now with several episodes of  exertional CP and SOB which sounds like exertional angina.  EKG is normal.  Will plan stress myoview to rule out ischemia.  His BP is mildly elevated today but he said it was  normal at his PCP;s office last week.  Will continue on current meds.  He has a history of mild AS in the past 2 years ago so will reassess 2D echo to make sure AS has not worsened and etiology of his symptoms. If his studies are normal then followup with me in a year.  Signed: Armanda Magic, MD 10/31/2013

## 2013-10-31 NOTE — Patient Instructions (Signed)
Your physician has requested that you have an echocardiogram. Echocardiography is a painless test that uses sound waves to create images of your heart. It provides your doctor with information about the size and shape of your heart and how well your heart's chambers and valves are working. This procedure takes approximately one hour. There are no restrictions for this procedure.  Your physician has requested that you have en exercise stress myoview. For further information please visit [email or web address] Please follow instruction sheet, as given.  Your physician wants you to follow-up in: 1 YEAR WITH DR TURNER.   You will receive a reminder letter in the mail two months in advance. If you don't receive a letter, please call our office to schedule the follow-up appointment.  Your physician recommends that you continue on your current medications as directed. Please refer to the Current Medication list given to you today.

## 2013-11-06 ENCOUNTER — Encounter: Payer: Self-pay | Admitting: Cardiology

## 2013-11-11 ENCOUNTER — Ambulatory Visit (HOSPITAL_COMMUNITY): Payer: Medicare Other | Attending: Cardiology | Admitting: Radiology

## 2013-11-11 VITALS — BP 116/86 | HR 86 | Ht 67.5 in | Wt 271.0 lb

## 2013-11-11 DIAGNOSIS — Z87891 Personal history of nicotine dependence: Secondary | ICD-10-CM | POA: Insufficient documentation

## 2013-11-11 DIAGNOSIS — R011 Cardiac murmur, unspecified: Secondary | ICD-10-CM

## 2013-11-11 DIAGNOSIS — R0789 Other chest pain: Secondary | ICD-10-CM | POA: Insufficient documentation

## 2013-11-11 DIAGNOSIS — I10 Essential (primary) hypertension: Secondary | ICD-10-CM | POA: Insufficient documentation

## 2013-11-11 DIAGNOSIS — R0989 Other specified symptoms and signs involving the circulatory and respiratory systems: Secondary | ICD-10-CM | POA: Insufficient documentation

## 2013-11-11 DIAGNOSIS — R0602 Shortness of breath: Secondary | ICD-10-CM

## 2013-11-11 DIAGNOSIS — Z9861 Coronary angioplasty status: Secondary | ICD-10-CM | POA: Insufficient documentation

## 2013-11-11 DIAGNOSIS — I2510 Atherosclerotic heart disease of native coronary artery without angina pectoris: Secondary | ICD-10-CM | POA: Insufficient documentation

## 2013-11-11 DIAGNOSIS — R0609 Other forms of dyspnea: Secondary | ICD-10-CM | POA: Insufficient documentation

## 2013-11-11 DIAGNOSIS — I4891 Unspecified atrial fibrillation: Secondary | ICD-10-CM | POA: Insufficient documentation

## 2013-11-11 DIAGNOSIS — Z8249 Family history of ischemic heart disease and other diseases of the circulatory system: Secondary | ICD-10-CM | POA: Insufficient documentation

## 2013-11-11 DIAGNOSIS — R002 Palpitations: Secondary | ICD-10-CM | POA: Insufficient documentation

## 2013-11-11 DIAGNOSIS — R079 Chest pain, unspecified: Secondary | ICD-10-CM

## 2013-11-11 MED ORDER — REGADENOSON 0.4 MG/5ML IV SOLN
0.4000 mg | Freq: Once | INTRAVENOUS | Status: AC
  Administered 2013-11-11: 0.4 mg via INTRAVENOUS

## 2013-11-11 MED ORDER — TECHNETIUM TC 99M SESTAMIBI GENERIC - CARDIOLITE
33.0000 | Freq: Once | INTRAVENOUS | Status: AC | PRN
  Administered 2013-11-11: 33 via INTRAVENOUS

## 2013-11-11 NOTE — Progress Notes (Signed)
[facility] S[facility] NUCLEAR MED [address] , [place] [address] [phone]    Cardiology Nuclear Med Study  Felix AhmadiJames F Bernet is a 73 y.o. male     MRN : [id number]     DOB: 1940-08-22  Procedure Date: 11/11/2013  Nuclear Med Background Indication for Stress Test:  Evaluation for Ischemia and Stent Patency History:  CAD, Cath, Stent, PTCA, hx Afib, PTVP, Echo 2013 (normal), MPI 2008 (normal) Cardiac Risk Factors: Family History - CAD, History of Smoking, Hypertension and Lipids  Symptoms:  Chest Pain (last date of chest discomfort was mid April), DOE and Palpitations   Nuclear Pre-Procedure Caffeine/Decaff Intake:  None> 12 hrs NPO After: 6:00pm   Lungs:  clear O2 Sat: 97% on room air. IV 0.9% NS with Angio Cath:  24g  IV Site: L Hand, tolerated well IV Started by:  Irean HongPatsy Edwards, RN  Chest Size (in):  50 Cup Size: n/a  Height: 5' 7.5" (1.715 m)  Weight:  271 lb (122.925 kg)  BMI:  Body mass index is 41.79 kg/(m^2). Tech Comments:  Patient has a history of vaso-vagal with needle sticks. Patient became clammy after IV stick that improved after trendelenburg position, no symptoms per patient. Vital signs stable proceeded with Lexiscan.Irean HongPatsy Edwards, RN.    Nuclear Med Study 1 or 2 day study: 2 day  Stress Test Type:  Lexiscan  Reading MD: N/A  Order Authorizing Newt Levingston:  Armanda Magicraci Turner, MD  Resting Radionuclide: Technetium 5139m Sestamibi  Resting Radionuclide Dose: 33.0 mCi on 11/18/13   Stress Radionuclide:  Technetium 5439m Sestamibi  Stress Radionuclide Dose: 33.0 mCi on 11/11/13           Stress Protocol Rest HR: 86 Stress HR: 78  Rest BP: 116/86 Stress BP: 65/37  Exercise Time (min): n/a METS: n/a           Dose of Adenosine (mg):  n/a Dose of Lexiscan: 0.4 mg  Dose of Atropine (mg): n/a Dose of Dobutamine: n/a mcg/kg/min (at max HR)  Stress Test Technologist: Nelson ChimesSharon Brooks, BS-ES  Nuclear Technologist:  Doyne Keelonya Yount, CNMT     Rest Procedure:   Myocardial perfusion imaging was performed at rest 45 minutes following the intravenous administration of Technetium 4139m Sestamibi. Rest ECG: NSR PVC LAD  Stress Procedure:  The patient received IV Lexiscan 0.4 mg over 15-seconds.  Technetium 439m Sestamibi injected at 30-seconds.  Quantitative spect images were obtained after a 45 minute delay.  During the infusion of Lexiscan, the patient complained of chest tightness, flushed, SOB, lightheadedness/dizziness.  BP dropped significantly, became very pate and patient stated he was about to faint.  Put patient in trendelenburg, gave patient 250 ml IV fluids and put on 2L O2 via nasal cannula.  Patient's BP increased and he began to feel better.  Symptoms resolved while in a prolonged recovery of 20 minutes.     Stress ECG: No significant change from baseline ECG  QPS Raw Data Images:  Patient motion noted. Stress Images:  Decreased apical counts Rest Images:  decreased apical counts Subtraction (SDS):  ishcemia Transient Ischemic Dilatation (Normal <1.22):  0.98 Lung/Heart Ratio (Normal <0.45):  0.35  Quantitative Gated Spect Images QGS EDV:  163 ml QGS ESV:  84 ml  Impression Exercise Capacity:  Lexiscan with no exercise. BP Response:  Normal blood pressure response. Clinical Symptoms:  Atypical chest pain. ECG Impression:  No significant ST segment change suggestive of ischemia. 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  LV Ejection Fraction: 49%.  LV Wall Motion:  Apical hypokinesis   Charlton Haws

## 2013-11-17 ENCOUNTER — Encounter (HOSPITAL_COMMUNITY): Payer: Medicare Other

## 2013-11-18 ENCOUNTER — Ambulatory Visit (HOSPITAL_COMMUNITY): Payer: Medicare Other | Attending: Cardiology

## 2013-11-18 DIAGNOSIS — R079 Chest pain, unspecified: Secondary | ICD-10-CM

## 2013-11-18 DIAGNOSIS — R0989 Other specified symptoms and signs involving the circulatory and respiratory systems: Secondary | ICD-10-CM

## 2013-11-18 MED ORDER — TECHNETIUM TC 99M SESTAMIBI GENERIC - CARDIOLITE
33.0000 | Freq: Once | INTRAVENOUS | Status: AC | PRN
  Administered 2013-11-18: 33 via INTRAVENOUS

## 2013-11-19 ENCOUNTER — Other Ambulatory Visit: Payer: Self-pay | Admitting: General Surgery

## 2013-11-19 ENCOUNTER — Encounter: Payer: Self-pay | Admitting: General Surgery

## 2013-11-19 ENCOUNTER — Other Ambulatory Visit (INDEPENDENT_AMBULATORY_CARE_PROVIDER_SITE_OTHER): Payer: Medicare Other

## 2013-11-19 ENCOUNTER — Encounter (HOSPITAL_COMMUNITY): Payer: Self-pay | Admitting: Pharmacy Technician

## 2013-11-19 ENCOUNTER — Other Ambulatory Visit: Payer: Self-pay | Admitting: Cardiology

## 2013-11-19 DIAGNOSIS — E785 Hyperlipidemia, unspecified: Secondary | ICD-10-CM

## 2013-11-19 DIAGNOSIS — R079 Chest pain, unspecified: Secondary | ICD-10-CM

## 2013-11-19 DIAGNOSIS — R9439 Abnormal result of other cardiovascular function study: Secondary | ICD-10-CM

## 2013-11-19 DIAGNOSIS — I25119 Atherosclerotic heart disease of native coronary artery with unspecified angina pectoris: Secondary | ICD-10-CM

## 2013-11-19 LAB — CBC WITH DIFFERENTIAL/PLATELET
BASOS ABS: 0 10*3/uL (ref 0.0–0.1)
Basophils Relative: 0.8 % (ref 0.0–3.0)
Eosinophils Absolute: 0.1 10*3/uL (ref 0.0–0.7)
Eosinophils Relative: 1.5 % (ref 0.0–5.0)
HEMATOCRIT: 44 % (ref 39.0–52.0)
Hemoglobin: 15 g/dL (ref 13.0–17.0)
Lymphocytes Relative: 18.8 % (ref 12.0–46.0)
Lymphs Abs: 1.1 10*3/uL (ref 0.7–4.0)
MCHC: 34.1 g/dL (ref 30.0–36.0)
MCV: 87.4 fl (ref 78.0–100.0)
MONO ABS: 0.5 10*3/uL (ref 0.1–1.0)
Monocytes Relative: 8.1 % (ref 3.0–12.0)
NEUTROS ABS: 4.2 10*3/uL (ref 1.4–7.7)
Neutrophils Relative %: 70.8 % (ref 43.0–77.0)
PLATELETS: 178 10*3/uL (ref 150.0–400.0)
RBC: 5.04 Mil/uL (ref 4.22–5.81)
RDW: 14.5 % (ref 11.5–15.5)
WBC: 5.9 10*3/uL (ref 4.0–10.5)

## 2013-11-19 LAB — PROTIME-INR
INR: 2.1 ratio — AB (ref 0.8–1.0)
PROTHROMBIN TIME: 22.8 s — AB (ref 9.6–13.1)

## 2013-11-19 LAB — BASIC METABOLIC PANEL
BUN: 20 mg/dL (ref 6–23)
CALCIUM: 9.3 mg/dL (ref 8.4–10.5)
CO2: 26 mEq/L (ref 19–32)
Chloride: 103 mEq/L (ref 96–112)
Creatinine, Ser: 1.2 mg/dL (ref 0.4–1.5)
GFR: 60.75 mL/min (ref >60.00)
Glucose, Bld: 139 mg/dL — ABNORMAL HIGH (ref 70–99)
Potassium: 3.5 mEq/L (ref 3.5–5.1)
SODIUM: 139 meq/L (ref 135–145)

## 2013-11-19 LAB — ALT: ALT: 30 U/L (ref 0–53)

## 2013-11-19 NOTE — H&P (Signed)
Patient ID: Brandon Klein, male   DOB: 05/28/1941, 72 y.o.   MRN: [id number]  [address] [place], [place]  [address] Phone: [phone] Fax:  [phone]  Date:  10/31/2013   ID:  Brandon Klein, DOB 10/18/1940, MRN [id number]  PCP:  BARNES,ELIZABETH STEWART, MD  Cardiologist:  Traci Turner, MD    History of Present Illness: Brandon Klein is a 72 y.o. male presenting for yearly follow-up.  Patient notes in mid march in [place] playing golf. Long walks to his golf ball. Got short of breath with this. 2 days later had a cold with chest congestion.   Then patient notes in mid April being exhausted while at his sons wedding. On a Sunday he was packing cars. He had shortness of breath and tightness in his chest while loading the car. States he was as tired as he's ever been that day.   Later that week cut grass and had tightness in chest and shortness of breath. Had BM and then went back out to cut the grass and had no more pain.   Since that time has not had recurrence of tightness or shortness of breath.   The tightness was in middle of chest. No radiation. He had shortness of breath with this. No diaphoresis. Palpitations are no more than usual. No dizziness or syncope.   Notes last stress test was in 10/08, normal cardiolite. Last stent LAD 1997. Last cath 12/2000.   He reports at his last PCP visit, his systolic blood pressure was 118.   Wt Readings from Last 3 Encounters:  10/31/13 276 lb (125.193 kg)  06/02/11 284 lb 6.4 oz (129.003 kg)  12/21/10 282 lb (127.914 kg)     Past Medical History  Diagnosis Date  . Syncope     Vasovagal With documented bradycardia  . Pacemaker   . Obesity   . HTN (hypertension)   . HLD (hyperlipidemia)   . DJD (degenerative joint disease)   . GERD (gastroesophageal reflux disease)   . Impaired fasting glucose     A1C check every year  . PAF (paroxysmal atrial fibrillation)     on chronic systemic anticoagulation  . Coronary  artery disease     s/p PCI of LAD 1997 and cutting balloon angioplasty to the left circ in 2002    Current Outpatient Prescriptions  Medication Sig Dispense Refill  . amLODipine (NORVASC) 10 MG tablet Take 10 mg by mouth daily.        . atorvastatin (LIPITOR) 20 MG tablet Take 20 mg by mouth daily.        . cholecalciferol (GNP VITAMIN D) 400 UNITS TABS Take 400 Units by mouth daily.        . fexofenadine (ALLEGRA) 180 MG tablet Take 180 mg by mouth daily.        . fluticasone (FLONASE) 50 MCG/ACT nasal spray Place 2 sprays into the nose daily.        . Glucosamine-Chondroit-Vit C-Mn (GLUCOSAMINE CHONDR 1500 COMPLX) CAPS Take 1 capsule by mouth daily.       . L-Lysine 500 MG CAPS Take 1 capsule by mouth daily.        . meclizine (ANTIVERT) 25 MG tablet Take 25 mg by mouth 3 (three) times daily as needed.       . Multiple Vitamin (MULTIVITAMIN) tablet Take 1 tablet by mouth daily.        . nitroGLYCERIN (NITROSTAT) 0.4 MG SL tablet Place 0.4   mg under the tongue every 5 (five) minutes as needed.        . Omega-3 Fatty Acids (FISH OIL) 500 MG CAPS Take 1,000 mg by mouth 2 (two) times daily.      . pantoprazole (PROTONIX) 40 MG tablet Take 40 mg by mouth daily.        . potassium chloride SA (K-DUR,KLOR-CON) 20 MEQ tablet Take 20 mEq by mouth daily.        . trandolapril (MAVIK) 4 MG tablet Take 4 mg by mouth daily.        . triamterene-hydrochlorothiazide (MAXZIDE) 75-50 MG per tablet Take 1 tablet by mouth daily.        . warfarin (COUMADIN) 5 MG tablet Take warfarin as directed by [facility]  130 tablet  1  . metoprolol succinate (TOPROL-XL) 50 MG 24 hr tablet Take 1 tablet (50 mg total) by mouth daily. Take with or immediately following a meal.  90 tablet  3   No current facility-administered medications for this visit.    Allergies:    Allergies  Allergen Reactions  . Amoxicillin   . Diovan [Valsartan]     Social History:  The patient  reports that he quit smoking about 47  years ago. He does not have any smokeless tobacco history on file.   Family History:  The patient's family history includes Colon cancer in his mother; Heart attack in his brother and mother; Leukemia in his father.   ROS:  Please see the history of present illness.   All other systems reviewed and negative.   PHYSICAL EXAM: VS:  BP 150/94  Pulse 68  Ht 5' 7.5" (1.715 m)  Wt 276 lb (125.193 kg)  BMI 42.56 kg/m2 Well nourished, well developed, in no acute distress HEENT: normal Neck: no JVD Cardiac:  normal S1, S2; RRR; 2/6 early peaking systolic murmur Lungs:  clear to auscultation bilaterally, no wheezing, rhonchi or rales Abd: soft, nontender, no hepatomegaly Ext: no edema Skin: warm and dry Neuro:  CNs 2-12 intact, no focal abnormalities noted  EKG:  Sinus rhythm with 1st degree block     ASSESSMENT AND PLAN:  1. CAD with stenting of the LAD in 1197 and cutting balloon angioplasty of the left circ in 2002- patient with complaint of chest tightness recently. Given history will plan for exercise stress test to evaluate for progression of CAD. 2. HTN- slightly elevated today, though per patient report was in normal range at last PCP visit. Will continue amlodipine 10 mg daily, mavik 4 mg daily, maxzide 75-50 daily, and toprol XL 50 mg daily. 3. HLD- will continue lipitor 20 mg daily 4. Systolic murmur- early peaking noted on exam today. Last echo noted to showed mild aortic stenosis 2013. Will repeat echo to evaluate for progression of stenosis 5. Atrail fibrillation- in sinus rhythm. To continue toprol XL 50 mg daily and coumadin 5 mg 1.5 tabs 4 days a week and 1 tab 3 days a week. 6. Follow-up in one year if echo and stress test are normal.  Signed, Eric Sonnenberg, MD 10/31/2013 2:02 PM   Patient's chart reviewed, seen, interviewed and examined and agree with note as outlined above.  He has a history of CAD with no recent noninvasive testing now with several episodes of  exertional CP and SOB which sounds like exertional angina.  EKG is normal.  Will plan stress myoview to rule out ischemia.  His BP is mildly elevated today but he said it was   normal at his PCP;s office last week.  Will continue on current meds.  He has a history of mild AS in the past 2 years ago so will reassess 2D echo to make sure AS has not worsened and etiology of his symptoms. If his studies are normal then followup with me in a year.  Signed: Traci Turner, MD 10/31/2013 

## 2013-11-20 ENCOUNTER — Ambulatory Visit (HOSPITAL_COMMUNITY): Payer: Medicare Other | Attending: Internal Medicine | Admitting: Radiology

## 2013-11-20 ENCOUNTER — Telehealth: Payer: Self-pay | Admitting: *Deleted

## 2013-11-20 ENCOUNTER — Encounter: Payer: Self-pay | Admitting: *Deleted

## 2013-11-20 DIAGNOSIS — I482 Chronic atrial fibrillation, unspecified: Secondary | ICD-10-CM

## 2013-11-20 DIAGNOSIS — R011 Cardiac murmur, unspecified: Secondary | ICD-10-CM | POA: Insufficient documentation

## 2013-11-20 DIAGNOSIS — I359 Nonrheumatic aortic valve disorder, unspecified: Secondary | ICD-10-CM

## 2013-11-20 DIAGNOSIS — R079 Chest pain, unspecified: Secondary | ICD-10-CM

## 2013-11-20 DIAGNOSIS — I2510 Atherosclerotic heart disease of native coronary artery without angina pectoris: Secondary | ICD-10-CM

## 2013-11-20 DIAGNOSIS — I350 Nonrheumatic aortic (valve) stenosis: Secondary | ICD-10-CM

## 2013-11-20 NOTE — Progress Notes (Signed)
Echocardiogram performed.  

## 2013-11-20 NOTE — Telephone Encounter (Signed)
Patient here for ECHO. Has questions regarding recent Stress Test. General results provided regarding ECHO and Stress Test results. Patient scheduled to have Catheterization on 6/30 with Dr. Katrinka Blazing. Patient had questions regarding differences between Stress Test/Cath/ECHO - general information provided regarding purpose of procedures and how they assist MD in making full diagnosis based on each finding from different tests/procedures.  Patient last took Coumadin 6/24. He is aware of Cath procedure, as he has had a previous one in the past. He has no further questions or concerns.

## 2013-11-24 ENCOUNTER — Other Ambulatory Visit: Payer: Self-pay | Admitting: Cardiology

## 2013-11-25 ENCOUNTER — Encounter (HOSPITAL_COMMUNITY)
Admission: RE | Disposition: A | Discharge status: Home / Self Care | Payer: Self-pay | Source: Ambulatory Visit | Attending: Cardiothoracic Surgery

## 2013-11-25 ENCOUNTER — Inpatient Hospital Stay (HOSPITAL_COMMUNITY)
Admission: RE | Admit: 2013-11-25 | Discharge: 2013-12-07 | DRG: 234 | Disposition: A | Discharge status: Home / Self Care | Payer: Medicare Other | Source: Ambulatory Visit | Attending: Cardiothoracic Surgery | Admitting: Cardiothoracic Surgery

## 2013-11-25 DIAGNOSIS — Z9861 Coronary angioplasty status: Secondary | ICD-10-CM

## 2013-11-25 DIAGNOSIS — Z950 Presence of cardiac pacemaker: Secondary | ICD-10-CM

## 2013-11-25 DIAGNOSIS — Z87891 Personal history of nicotine dependence: Secondary | ICD-10-CM

## 2013-11-25 DIAGNOSIS — I4891 Unspecified atrial fibrillation: Secondary | ICD-10-CM | POA: Diagnosis present

## 2013-11-25 DIAGNOSIS — Z888 Allergy status to other drugs, medicaments and biological substances status: Secondary | ICD-10-CM

## 2013-11-25 DIAGNOSIS — Z7901 Long term (current) use of anticoagulants: Secondary | ICD-10-CM

## 2013-11-25 DIAGNOSIS — I200 Unstable angina: Secondary | ICD-10-CM | POA: Diagnosis present

## 2013-11-25 DIAGNOSIS — I25119 Atherosclerotic heart disease of native coronary artery with unspecified angina pectoris: Secondary | ICD-10-CM

## 2013-11-25 DIAGNOSIS — R011 Cardiac murmur, unspecified: Secondary | ICD-10-CM | POA: Diagnosis present

## 2013-11-25 DIAGNOSIS — I10 Essential (primary) hypertension: Secondary | ICD-10-CM | POA: Diagnosis present

## 2013-11-25 DIAGNOSIS — Z6841 Body Mass Index (BMI) 40.0 and over, adult: Secondary | ICD-10-CM

## 2013-11-25 DIAGNOSIS — D62 Acute posthemorrhagic anemia: Secondary | ICD-10-CM | POA: Diagnosis not present

## 2013-11-25 DIAGNOSIS — I359 Nonrheumatic aortic valve disorder, unspecified: Secondary | ICD-10-CM | POA: Diagnosis present

## 2013-11-25 DIAGNOSIS — R001 Bradycardia, unspecified: Secondary | ICD-10-CM | POA: Diagnosis present

## 2013-11-25 DIAGNOSIS — Z881 Allergy status to other antibiotic agents status: Secondary | ICD-10-CM

## 2013-11-25 DIAGNOSIS — I480 Paroxysmal atrial fibrillation: Secondary | ICD-10-CM | POA: Diagnosis present

## 2013-11-25 DIAGNOSIS — T82897A Other specified complication of cardiac prosthetic devices, implants and grafts, initial encounter: Secondary | ICD-10-CM | POA: Diagnosis present

## 2013-11-25 DIAGNOSIS — E8779 Other fluid overload: Secondary | ICD-10-CM | POA: Diagnosis not present

## 2013-11-25 DIAGNOSIS — E785 Hyperlipidemia, unspecified: Secondary | ICD-10-CM | POA: Diagnosis present

## 2013-11-25 DIAGNOSIS — Z806 Family history of leukemia: Secondary | ICD-10-CM

## 2013-11-25 DIAGNOSIS — I209 Angina pectoris, unspecified: Secondary | ICD-10-CM

## 2013-11-25 DIAGNOSIS — Y849 Medical procedure, unspecified as the cause of abnormal reaction of the patient, or of later complication, without mention of misadventure at the time of the procedure: Secondary | ICD-10-CM | POA: Diagnosis present

## 2013-11-25 DIAGNOSIS — R739 Hyperglycemia, unspecified: Secondary | ICD-10-CM | POA: Diagnosis present

## 2013-11-25 DIAGNOSIS — G4733 Obstructive sleep apnea (adult) (pediatric): Secondary | ICD-10-CM | POA: Diagnosis present

## 2013-11-25 DIAGNOSIS — E119 Type 2 diabetes mellitus without complications: Secondary | ICD-10-CM | POA: Diagnosis present

## 2013-11-25 DIAGNOSIS — R9439 Abnormal result of other cardiovascular function study: Secondary | ICD-10-CM

## 2013-11-25 DIAGNOSIS — Z8249 Family history of ischemic heart disease and other diseases of the circulatory system: Secondary | ICD-10-CM

## 2013-11-25 DIAGNOSIS — D696 Thrombocytopenia, unspecified: Secondary | ICD-10-CM | POA: Diagnosis present

## 2013-11-25 DIAGNOSIS — I2510 Atherosclerotic heart disease of native coronary artery without angina pectoris: Principal | ICD-10-CM

## 2013-11-25 DIAGNOSIS — K219 Gastro-esophageal reflux disease without esophagitis: Secondary | ICD-10-CM | POA: Diagnosis present

## 2013-11-25 DIAGNOSIS — Z800 Family history of malignant neoplasm of digestive organs: Secondary | ICD-10-CM

## 2013-11-25 DIAGNOSIS — M1990 Unspecified osteoarthritis, unspecified site: Secondary | ICD-10-CM | POA: Diagnosis present

## 2013-11-25 HISTORY — PX: LEFT HEART CATHETERIZATION WITH CORONARY ANGIOGRAM: SHX5451

## 2013-11-25 LAB — POCT ACTIVATED CLOTTING TIME: Activated Clotting Time: 140 seconds

## 2013-11-25 LAB — PROTIME-INR
INR: 1 (ref 0.00–1.49)
Prothrombin Time: 13.2 seconds (ref 11.6–15.2)

## 2013-11-25 SURGERY — LEFT HEART CATHETERIZATION WITH CORONARY ANGIOGRAM
Anesthesia: LOCAL

## 2013-11-25 MED ORDER — VERAPAMIL HCL 2.5 MG/ML IV SOLN
INTRAVENOUS | Status: AC
  Filled 2013-11-25: qty 2

## 2013-11-25 MED ORDER — TRANDOLAPRIL 4 MG PO TABS
4.0000 mg | ORAL_TABLET | Freq: Every day | ORAL | Status: DC
  Administered 2013-11-26 – 2013-11-30 (×5): 4 mg via ORAL
  Filled 2013-11-25 (×6): qty 1

## 2013-11-25 MED ORDER — NITROGLYCERIN 0.2 MG/ML ON CALL CATH LAB
INTRAVENOUS | Status: AC
  Filled 2013-11-25: qty 1

## 2013-11-25 MED ORDER — ACETAMINOPHEN 325 MG PO TABS
650.0000 mg | ORAL_TABLET | ORAL | Status: DC | PRN
  Administered 2013-11-26 – 2013-11-29 (×2): 650 mg via ORAL
  Filled 2013-11-25 (×2): qty 2

## 2013-11-25 MED ORDER — ASPIRIN 81 MG PO CHEW
81.0000 mg | CHEWABLE_TABLET | ORAL | Status: AC
  Administered 2013-11-25: 81 mg via ORAL

## 2013-11-25 MED ORDER — MIDAZOLAM HCL 2 MG/2ML IJ SOLN
INTRAMUSCULAR | Status: AC
  Filled 2013-11-25: qty 2

## 2013-11-25 MED ORDER — SODIUM CHLORIDE 0.9 % IJ SOLN: 3.0000 mL | INTRAMUSCULAR | Status: DC | PRN

## 2013-11-25 MED ORDER — HEPARIN (PORCINE) IN NACL 2-0.9 UNIT/ML-% IJ SOLN
INTRAMUSCULAR | Status: AC
  Filled 2013-11-25: qty 1000

## 2013-11-25 MED ORDER — SODIUM CHLORIDE 0.9 % IJ SOLN: 3.0000 mL | Freq: Two times a day (BID) | INTRAMUSCULAR | Status: DC

## 2013-11-25 MED ORDER — ATORVASTATIN CALCIUM 20 MG PO TABS
20.0000 mg | ORAL_TABLET | Freq: Every day | ORAL | Status: DC
  Administered 2013-11-26 – 2013-12-06 (×10): 20 mg via ORAL
  Filled 2013-11-25 (×12): qty 1

## 2013-11-25 MED ORDER — HEPARIN SODIUM (PORCINE) 1000 UNIT/ML IJ SOLN
INTRAMUSCULAR | Status: AC
  Filled 2013-11-25: qty 1

## 2013-11-25 MED ORDER — HEPARIN (PORCINE) IN NACL 100-0.45 UNIT/ML-% IJ SOLN
1150.0000 [IU]/h | INTRAMUSCULAR | Status: DC
  Filled 2013-11-25: qty 250

## 2013-11-25 MED ORDER — SODIUM CHLORIDE 0.9 % IV SOLN
INTRAVENOUS | Status: DC
  Administered 2013-11-25: 11:00:00 via INTRAVENOUS

## 2013-11-25 MED ORDER — METOPROLOL SUCCINATE ER 50 MG PO TB24
50.0000 mg | ORAL_TABLET | Freq: Every day | ORAL | Status: DC
  Administered 2013-11-26 – 2013-11-30 (×5): 50 mg via ORAL
  Filled 2013-11-25 (×6): qty 1

## 2013-11-25 MED ORDER — ONDANSETRON HCL 4 MG/2ML IJ SOLN: 4.0000 mg | Freq: Four times a day (QID) | INTRAMUSCULAR | Status: DC | PRN

## 2013-11-25 MED ORDER — SODIUM CHLORIDE 0.9 % IV SOLN: 1.0000 mL/kg/h | INTRAVENOUS | Status: AC

## 2013-11-25 MED ORDER — LIDOCAINE HCL (PF) 1 % IJ SOLN
INTRAMUSCULAR | Status: AC
  Filled 2013-11-25: qty 30

## 2013-11-25 MED ORDER — POTASSIUM CHLORIDE CRYS ER 20 MEQ PO TBCR
20.0000 meq | EXTENDED_RELEASE_TABLET | Freq: Every day | ORAL | Status: DC
  Administered 2013-11-26 – 2013-11-30 (×5): 20 meq via ORAL
  Filled 2013-11-25 (×7): qty 1

## 2013-11-25 MED ORDER — HEPARIN (PORCINE) IN NACL 100-0.45 UNIT/ML-% IJ SOLN
1400.0000 [IU]/h | INTRAMUSCULAR | Status: DC
  Administered 2013-11-26: 18:00:00 1400 [IU]/h via INTRAVENOUS
  Administered 2013-11-26: 1150 [IU]/h via INTRAVENOUS
  Administered 2013-11-28 – 2013-11-30 (×4): 1400 [IU]/h via INTRAVENOUS
  Filled 2013-11-25 (×9): qty 250

## 2013-11-25 MED ORDER — ASPIRIN 81 MG PO CHEW
CHEWABLE_TABLET | ORAL | Status: AC
  Administered 2013-11-25: 81 mg via ORAL
  Filled 2013-11-25: qty 1

## 2013-11-25 MED ORDER — TRIAMTERENE-HCTZ 75-50 MG PO TABS
1.0000 | ORAL_TABLET | Freq: Every day | ORAL | Status: DC
  Administered 2013-11-26 – 2013-11-30 (×5): 1 via ORAL
  Filled 2013-11-25 (×6): qty 1

## 2013-11-25 MED ORDER — SODIUM CHLORIDE 0.9 % IV SOLN: 250.0000 mL | INTRAVENOUS | Status: DC | PRN

## 2013-11-25 MED ORDER — PANTOPRAZOLE SODIUM 40 MG PO TBEC
40.0000 mg | DELAYED_RELEASE_TABLET | Freq: Every day | ORAL | Status: DC
  Administered 2013-11-26 – 2013-11-30 (×5): 40 mg via ORAL
  Filled 2013-11-25 (×5): qty 1

## 2013-11-25 MED ORDER — AMLODIPINE BESYLATE 10 MG PO TABS
10.0000 mg | ORAL_TABLET | Freq: Every day | ORAL | Status: DC
  Administered 2013-11-26 – 2013-11-30 (×5): 10 mg via ORAL
  Filled 2013-11-25 (×6): qty 1

## 2013-11-25 MED ORDER — FENTANYL CITRATE 0.05 MG/ML IJ SOLN
INTRAMUSCULAR | Status: AC
Start: 2013-11-25 — End: 2013-11-25
  Filled 2013-11-25: qty 2

## 2013-11-25 NOTE — Interval H&P Note (Signed)
History and Physical Interval Note:  11/25/2013 3:01 PM  Brandon Klein  has presented today for surgery, with the diagnosis of abnormal stress test - HIGH RISK for Class II Angina.   The various methods of treatment have been discussed with the patient and family. After consideration of risks, benefits and other options for treatment, the patient has consented to  Procedure(s): LEFT HEART CATHETERIZATION WITH CORONARY ANGIOGRAM (N/A) +/- PCI as a surgical intervention .  The patient's history has been reviewed, patient examined, no change in status, stable for surgery.  I have reviewed the patient's chart and labs.  Questions were answered to the patient's satisfaction.     HARDING,DAVID W  Cath Lab Visit (complete for each Cath Lab visit)  Clinical Evaluation Leading to the Procedure:   ACS: No.  Non-ACS:    Anginal Classification: CCS II  Anti-ischemic medical therapy: Maximal Therapy (2 or more classes of medications)  Non-Invasive Test Results: High-risk stress test findings: cardiac mortality >3%/year  Prior CABG: No previous CABG

## 2013-11-25 NOTE — H&P (View-Only) (Signed)
Patient ID: Brandon Klein, male   DOB: 06/04/1940, 72 y.o.   MRN: [id number]  [address] [place], [place]  [address] Phone: [phone] Fax:  [phone]  Date:  10/31/2013   ID:  Brandon Klein, DOB 09/25/1940, MRN [id number]  PCP:  BARNES,ELIZABETH STEWART, MD  Cardiologist:  Traci Turner, MD    History of Present Illness: Brandon Klein is a 72 y.o. male presenting for yearly follow-up.  Patient notes in mid march in [place] playing golf. Long walks to his golf ball. Got short of breath with this. 2 days later had a cold with chest congestion.   Then patient notes in mid April being exhausted while at his sons wedding. On a Sunday he was packing cars. He had shortness of breath and tightness in his chest while loading the car. States he was as tired as he's ever been that day.   Later that week cut grass and had tightness in chest and shortness of breath. Had BM and then went back out to cut the grass and had no more pain.   Since that time has not had recurrence of tightness or shortness of breath.   The tightness was in middle of chest. No radiation. He had shortness of breath with this. No diaphoresis. Palpitations are no more than usual. No dizziness or syncope.   Notes last stress test was in 10/08, normal cardiolite. Last stent LAD 1997. Last cath 12/2000.   He reports at his last PCP visit, his systolic blood pressure was 118.   Wt Readings from Last 3 Encounters:  10/31/13 276 lb (125.193 kg)  06/02/11 284 lb 6.4 oz (129.003 kg)  12/21/10 282 lb (127.914 kg)     Past Medical History  Diagnosis Date  . Syncope     Vasovagal With documented bradycardia  . Pacemaker   . Obesity   . HTN (hypertension)   . HLD (hyperlipidemia)   . DJD (degenerative joint disease)   . GERD (gastroesophageal reflux disease)   . Impaired fasting glucose     A1C check every year  . PAF (paroxysmal atrial fibrillation)     on chronic systemic anticoagulation  . Coronary  artery disease     s/p PCI of LAD 1997 and cutting balloon angioplasty to the left circ in 2002    Current Outpatient Prescriptions  Medication Sig Dispense Refill  . amLODipine (NORVASC) 10 MG tablet Take 10 mg by mouth daily.        . atorvastatin (LIPITOR) 20 MG tablet Take 20 mg by mouth daily.        . cholecalciferol (GNP VITAMIN D) 400 UNITS TABS Take 400 Units by mouth daily.        . fexofenadine (ALLEGRA) 180 MG tablet Take 180 mg by mouth daily.        . fluticasone (FLONASE) 50 MCG/ACT nasal spray Place 2 sprays into the nose daily.        . Glucosamine-Chondroit-Vit C-Mn (GLUCOSAMINE CHONDR 1500 COMPLX) CAPS Take 1 capsule by mouth daily.       . L-Lysine 500 MG CAPS Take 1 capsule by mouth daily.        . meclizine (ANTIVERT) 25 MG tablet Take 25 mg by mouth 3 (three) times daily as needed.       . Multiple Vitamin (MULTIVITAMIN) tablet Take 1 tablet by mouth daily.        . nitroGLYCERIN (NITROSTAT) 0.4 MG SL tablet Place 0.4   mg under the tongue every 5 (five) minutes as needed.        . Omega-3 Fatty Acids (FISH OIL) 500 MG CAPS Take 1,000 mg by mouth 2 (two) times daily.      . pantoprazole (PROTONIX) 40 MG tablet Take 40 mg by mouth daily.        . potassium chloride SA (K-DUR,KLOR-CON) 20 MEQ tablet Take 20 mEq by mouth daily.        . trandolapril (MAVIK) 4 MG tablet Take 4 mg by mouth daily.        . triamterene-hydrochlorothiazide (MAXZIDE) 75-50 MG per tablet Take 1 tablet by mouth daily.        . warfarin (COUMADIN) 5 MG tablet Take warfarin as directed by [facility]  130 tablet  1  . metoprolol succinate (TOPROL-XL) 50 MG 24 hr tablet Take 1 tablet (50 mg total) by mouth daily. Take with or immediately following a meal.  90 tablet  3   No current facility-administered medications for this visit.    Allergies:    Allergies  Allergen Reactions  . Amoxicillin   . Diovan [Valsartan]     Social History:  The patient  reports that he quit smoking about 47  years ago. He does not have any smokeless tobacco history on file.   Family History:  The patient's family history includes Colon cancer in his mother; Heart attack in his brother and mother; Leukemia in his father.   ROS:  Please see the history of present illness.   All other systems reviewed and negative.   PHYSICAL EXAM: VS:  BP 150/94  Pulse 68  Ht 5' 7.5" (1.715 m)  Wt 276 lb (125.193 kg)  BMI 42.56 kg/m2 Well nourished, well developed, in no acute distress HEENT: normal Neck: no JVD Cardiac:  normal S1, S2; RRR; 2/6 early peaking systolic murmur Lungs:  clear to auscultation bilaterally, no wheezing, rhonchi or rales Abd: soft, nontender, no hepatomegaly Ext: no edema Skin: warm and dry Neuro:  CNs 2-12 intact, no focal abnormalities noted  EKG:  Sinus rhythm with 1st degree block     ASSESSMENT AND PLAN:  1. CAD with stenting of the LAD in 1197 and cutting balloon angioplasty of the left circ in 2002- patient with complaint of chest tightness recently. Given history will plan for exercise stress test to evaluate for progression of CAD. 2. HTN- slightly elevated today, though per patient report was in normal range at last PCP visit. Will continue amlodipine 10 mg daily, mavik 4 mg daily, maxzide 75-50 daily, and toprol XL 50 mg daily. 3. HLD- will continue lipitor 20 mg daily 4. Systolic murmur- early peaking noted on exam today. Last echo noted to showed mild aortic stenosis 2013. Will repeat echo to evaluate for progression of stenosis 5. Atrail fibrillation- in sinus rhythm. To continue toprol XL 50 mg daily and coumadin 5 mg 1.5 tabs 4 days a week and 1 tab 3 days a week. 6. Follow-up in one year if echo and stress test are normal.  Signed, Eric Sonnenberg, MD 10/31/2013 2:02 PM   Patient's chart reviewed, seen, interviewed and examined and agree with note as outlined above.  He has a history of CAD with no recent noninvasive testing now with several episodes of  exertional CP and SOB which sounds like exertional angina.  EKG is normal.  Will plan stress myoview to rule out ischemia.  His BP is mildly elevated today but he said it was   normal at his PCP;s office last week.  Will continue on current meds.  He has a history of mild AS in the past 2 years ago so will reassess 2D echo to make sure AS has not worsened and etiology of his symptoms. If his studies are normal then followup with me in a year.  Signed: Traci Turner, MD 10/31/2013 

## 2013-11-25 NOTE — CV Procedure (Addendum)
   PROCEDURE:  Left heart catheterization with selective coronary angiography, left ventriculogram.  INDICATIONS:    The risks, benefits, and details of the procedure were explained to the patient.  The patient verbalized understanding and wanted to proceed.  Informed written consent was obtained.  PROCEDURE TECHNIQUE:  After Xylocaine anesthesia a 31F sheath was placed in the right femoral artery with a single anterior needle wall stick.   Left coronary angiography was done using a Judkins L4 guide catheter.  Right coronary angiography was done using a Judkins R4 guide catheter.  Left ventriculography was done using a pigtail catheter.    CONTRAST:  Total of 40 cc.  COMPLICATIONS:  None.    HEMODYNAMICS:  No performed due to inability to cross LV with pigtail catheter  ANGIOGRAPHIC DATA:   The left main coronary artery is widely patent and trifurcates into an LAD, left circumflex and ramus arteries.  The left anterior descending artery has an 80% stenosis in the proximal portion before the LAD stent.  There is a 90% instent restenosis within the body of the LAD stent.  It then gives rise to a large diagonal branch that has a 90% stenosis in the mid portion.    The Ramus is a moderate sized vessel that is patent and distally bifurcates into 2 daughter vessels.    The left circumflex artery is a large vessel.  Mid way down it gives rise to a very large OM1.  It gives rise to a superior daughter branch which is patent.  In the mid portion of the OM there is an 80-90% stenosis .  Distally it gives rise to 2 more branches which are patent.  The right coronary artery is widely patent in the proximal and mid vessel and gives rise to a moderate sized acute RV marginal branch which is patent.  Distally the RCA has a 70% stenosis before giving rise to a PDA and PL branches.Marland Kitchen  LEFT VENTRICULOGRAM:  Not performed due to inability to cross the AV  IMPRESSIONS:  1. Normal left main coronary  artery. 2. 80% proximal LAD stenosis proximal to the stent and 90% instent restenosis of the LAD stent.  90% mid diagonal stenosis.   3. 80-90% mid OM stenosis in a very large vessel. 4. 70% distal RCA   RECOMMENDATION:   Films were reviewed with Dr. Herbie Baltimore.  He has significant disease in his left circ/OM system as well as a complex sequential lesions in his LAD involving a prior stent and diagonal stenosis as well as RCA/PDA disease.  Recommend CABG.  Will admit and start on ASA/IV Heparin gtt and get CVTS consult,.

## 2013-11-25 NOTE — Progress Notes (Signed)
ANTICOAGULATION CONSULT NOTE - Initial Consult  Pharmacy Consult for Heparin Indication: Planned CABG; post PCI   Allergies  Allergen Reactions  . Amoxicillin Other (See Comments)    Unknown-maybe a rash?  . Diovan [Valsartan] Other (See Comments)    Unknown     Patient Measurements: Height: 5' 7.5" (171.5 cm) Weight: 269 lb (122.018 kg) IBW/kg (Calculated) : 67.25 Heparin Dosing Weight: 95 kg   Vital Signs: Temp: 98.2 F (36.8 C) (06/30 1814) Temp src: Oral (06/30 1814) BP: 135/96 mmHg (06/30 1814) Pulse Rate: 64 (06/30 1814)  Labs:  Recent Labs  11/25/13 1030  LABPROT 13.2  INR 1.00    Estimated Creatinine Clearance: 70.2 ml/min (by C-G formula based on Cr of 1.2).   Medical History: Past Medical History  Diagnosis Date  . Syncope     Vasovagal With documented bradycardia  . Pacemaker   . Obesity   . HTN (hypertension)   . HLD (hyperlipidemia)   . DJD (degenerative joint disease)   . GERD (gastroesophageal reflux disease)   . Impaired fasting glucose     A1C check every year  . PAF (paroxysmal atrial fibrillation)     on chronic systemic anticoagulation  . Coronary artery disease     s/p PCI of LAD 1997 and cutting balloon angioplasty to the left circ in 2002    Medications:  Prescriptions prior to admission  Medication Sig Dispense Refill  . amLODipine (NORVASC) 10 MG tablet Take 10 mg by mouth daily.        Marland Kitchen atorvastatin (LIPITOR) 20 MG tablet Take 20 mg by mouth daily.        . cholecalciferol (GNP VITAMIN D) 400 UNITS TABS Take 400 Units by mouth daily.        . fexofenadine (ALLEGRA) 180 MG tablet Take 180 mg by mouth daily.        . fluticasone (FLONASE) 50 MCG/ACT nasal spray Place 2 sprays into the nose daily.        . Glucosamine-Chondroit-Vit C-Mn (GLUCOSAMINE CHONDR 1500 COMPLX) CAPS Take 1 capsule by mouth daily.       Marland Kitchen L-Lysine 500 MG CAPS Take 1 capsule by mouth daily.        . meclizine (ANTIVERT) 25 MG tablet Take 25 mg by mouth 3  (three) times daily as needed for dizziness.       . metoprolol succinate (TOPROL-XL) 50 MG 24 hr tablet Take 1 tablet (50 mg total) by mouth daily. Take with or immediately following a meal.  90 tablet  3  . Multiple Vitamin (MULTIVITAMIN) tablet Take 1 tablet by mouth daily.        . nitroGLYCERIN (NITROSTAT) 0.4 MG SL tablet Place 0.4 mg under the tongue every 5 (five) minutes as needed for chest pain.       . Omega-3 Fatty Acids (FISH OIL) 500 MG CAPS Take 1,000 mg by mouth 2 (two) times daily.      . pantoprazole (PROTONIX) 40 MG tablet Take 40 mg by mouth daily.        . potassium chloride SA (K-DUR,KLOR-CON) 20 MEQ tablet Take 20 mEq by mouth daily.        . trandolapril (MAVIK) 4 MG tablet Take 4 mg by mouth daily.        Marland Kitchen triamterene-hydrochlorothiazide (MAXZIDE) 75-50 MG per tablet Take 1 tablet by mouth daily.          Assessment: 40 YOM s/p left heart catheterization. Cardiology recommending CABG. Pharmacy  consulted to manage heparin drip. BL H/H and Plt on 6/24 wnl. INR today was 1. CrCl ~ 70 mL/min   Goal of Therapy:  Heparin level 0.3-0.7 units/ml Monitor platelets by anticoagulation protocol: Yes   Plan:  1) Start heparin infusion at 1150 units/hr. No bolus  2) F/u 8 hour HL 3) Monitor daily CBC and s/s of bleeding   Vinnie LevelBenjamin Sandford Diop, PharmD.  Clinical Pharmacist Pager [phone]

## 2013-11-26 ENCOUNTER — Encounter: Payer: Self-pay | Admitting: *Deleted

## 2013-11-26 ENCOUNTER — Encounter (HOSPITAL_COMMUNITY): Payer: Self-pay | Admitting: General Practice

## 2013-11-26 ENCOUNTER — Other Ambulatory Visit: Payer: Self-pay | Admitting: *Deleted

## 2013-11-26 DIAGNOSIS — I4891 Unspecified atrial fibrillation: Secondary | ICD-10-CM

## 2013-11-26 DIAGNOSIS — I2510 Atherosclerotic heart disease of native coronary artery without angina pectoris: Secondary | ICD-10-CM

## 2013-11-26 DIAGNOSIS — I359 Nonrheumatic aortic valve disorder, unspecified: Secondary | ICD-10-CM

## 2013-11-26 DIAGNOSIS — I209 Angina pectoris, unspecified: Secondary | ICD-10-CM

## 2013-11-26 DIAGNOSIS — R9439 Abnormal result of other cardiovascular function study: Secondary | ICD-10-CM

## 2013-11-26 DIAGNOSIS — E785 Hyperlipidemia, unspecified: Secondary | ICD-10-CM

## 2013-11-26 DIAGNOSIS — R7309 Other abnormal glucose: Secondary | ICD-10-CM

## 2013-11-26 DIAGNOSIS — R739 Hyperglycemia, unspecified: Secondary | ICD-10-CM | POA: Diagnosis present

## 2013-11-26 DIAGNOSIS — I10 Essential (primary) hypertension: Secondary | ICD-10-CM

## 2013-11-26 LAB — URINALYSIS, ROUTINE W REFLEX MICROSCOPIC
Bilirubin Urine: NEGATIVE
Glucose, UA: NEGATIVE mg/dL
Hgb urine dipstick: NEGATIVE
Ketones, ur: NEGATIVE mg/dL
Leukocytes, UA: NEGATIVE
Nitrite: NEGATIVE
Protein, ur: NEGATIVE mg/dL
Specific Gravity, Urine: 1.012 (ref 1.005–1.030)
Urobilinogen, UA: 0.2 mg/dL (ref 0.0–1.0)
pH: 7 (ref 5.0–8.0)

## 2013-11-26 LAB — BASIC METABOLIC PANEL
BUN: 19 mg/dL (ref 6–23)
CO2: 26 mEq/L (ref 19–32)
Calcium: 8.9 mg/dL (ref 8.4–10.5)
Chloride: 101 mEq/L (ref 96–112)
Creatinine, Ser: 1.16 mg/dL (ref 0.50–1.35)
GFR calc Af Amer: 71 mL/min — ABNORMAL LOW (ref >90)
GFR calc non Af Amer: 61 mL/min — ABNORMAL LOW (ref >90)
Glucose, Bld: 119 mg/dL — ABNORMAL HIGH (ref 70–99)
Potassium: 3.9 mEq/L (ref 3.7–5.3)
Sodium: 139 mEq/L (ref 137–147)

## 2013-11-26 LAB — SURGICAL PCR SCREEN
MRSA, PCR: NEGATIVE
Staphylococcus aureus: POSITIVE — AB

## 2013-11-26 LAB — HEMOGLOBIN A1C
Hgb A1c MFr Bld: 6.5 % — ABNORMAL HIGH (ref <5.7)
MEAN PLASMA GLUCOSE: 140 mg/dL — AB (ref <117)

## 2013-11-26 LAB — CBC
HCT: 38.2 % — ABNORMAL LOW (ref 39.0–52.0)
Hemoglobin: 13.3 g/dL (ref 13.0–17.0)
MCH: 30.2 pg (ref 26.0–34.0)
MCHC: 34.8 g/dL (ref 30.0–36.0)
MCV: 86.6 fL (ref 78.0–100.0)
Platelets: 126 10*3/uL — ABNORMAL LOW (ref 150–400)
RBC: 4.41 MIL/uL (ref 4.22–5.81)
RDW: 13.8 % (ref 11.5–15.5)
WBC: 5.7 10*3/uL (ref 4.0–10.5)

## 2013-11-26 LAB — HEPARIN LEVEL (UNFRACTIONATED)
HEPARIN UNFRACTIONATED: 0.39 [IU]/mL (ref 0.30–0.70)
Heparin Unfractionated: 0.16 IU/mL — ABNORMAL LOW (ref 0.30–0.70)

## 2013-11-26 MED ORDER — ONE-DAILY MULTI VITAMINS PO TABS: 1.0000 | ORAL_TABLET | Freq: Every day | ORAL | Status: DC

## 2013-11-26 MED ORDER — ADULT MULTIVITAMIN W/MINERALS CH
1.0000 | ORAL_TABLET | Freq: Every day | ORAL | Status: DC
  Administered 2013-11-26 – 2013-11-30 (×5): 1 via ORAL
  Filled 2013-11-26 (×6): qty 1

## 2013-11-26 MED ORDER — ASPIRIN EC 81 MG PO TBEC
81.0000 mg | DELAYED_RELEASE_TABLET | Freq: Every day | ORAL | Status: DC
  Administered 2013-11-26 – 2013-11-30 (×5): 81 mg via ORAL
  Filled 2013-11-26 (×6): qty 1

## 2013-11-26 MED ORDER — LORATADINE 10 MG PO TABS
10.0000 mg | ORAL_TABLET | Freq: Every day | ORAL | Status: DC
  Administered 2013-11-26 – 2013-12-07 (×11): 10 mg via ORAL
  Filled 2013-11-26 (×12): qty 1

## 2013-11-26 MED ORDER — FLUTICASONE PROPIONATE 50 MCG/ACT NA SUSP
2.0000 | Freq: Every day | NASAL | Status: DC
  Administered 2013-11-26 – 2013-12-07 (×11): 2 via NASAL
  Filled 2013-11-26 (×2): qty 16

## 2013-11-26 NOTE — Progress Notes (Signed)
CARDIAC REHAB PHASE I   PRE:  Rate/Rhythm: 72 paced  BP:  Supine: 142/68  Sitting:   Standing:    SaO2: 94%RA  MODE:  Ambulation: 400 ft   POST:  Rate/Rhythm: 93 paced  BP:  Supine: 170/79  Sitting:   Standing:    SaO2: 95%RA 1410-1448 Ok to walk per Dr.Van Tright. Pt walked 400 ft on RA with steady gait. No CP. Demonstrated to pt how to get up and out of bed observing sternal precautions. Pt has [facility] booklet and gave him care guide. Can get 2500 ml on IS. Discussed with pt need for care 24/7 first week after surgery which will be wife.   Brandon Nutting, RN BSN  11/26/2013 2:43 PM

## 2013-11-26 NOTE — Progress Notes (Signed)
UR completed Keylen Eckenrode K. Marcena Dias, RN, BSN, MSHL, CCM  11/26/2013 1:26 PM

## 2013-11-26 NOTE — Progress Notes (Signed)
ANTICOAGULATION CONSULT NOTE - Follow Up Consult  Pharmacy Consult for heparin Indication: atrial fibrillation  Allergies  Allergen Reactions  . Amoxicillin Other (See Comments)    Unknown-maybe a rash?  . Diovan [Valsartan] Other (See Comments)    Unknown     Patient Measurements: Height: 5' 7.5" (171.5 cm) Weight: 273 lb 5.9 oz (124 kg) IBW/kg (Calculated) : 67.25 Heparin Dosing Weight: 95 kg  Vital Signs: Temp: 98.3 F (36.8 C) (07/01 0624) Temp src: Oral (07/01 0624) BP: 131/47 mmHg (07/01 0624) Pulse Rate: 62 (07/01 0624)  Labs:  Recent Labs  11/25/13 1030 11/26/13 0530  HGB  --  13.3  HCT  --  38.2*  PLT  --  126*  LABPROT 13.2  --   INR 1.00  --   CREATININE  --  1.16    Estimated Creatinine Clearance: 73.3 ml/min (by C-G formula based on Cr of 1.16).   Medications:  Scheduled:  . amLODipine  10 mg Oral Daily  . aspirin EC  81 mg Oral Daily  . atorvastatin  20 mg Oral q1800  . metoprolol succinate  50 mg Oral Daily  . pantoprazole  40 mg Oral Daily  . potassium chloride SA  20 mEq Oral Daily  . trandolapril  4 mg Oral Daily  . triamterene-hydrochlorothiazide  1 tablet Oral Daily   Infusions:  . heparin 1,150 Units/hr (11/26/13 0600)    Assessment: 73 yo male with afib is currently on subtherapeutic heparin. Heparin level is 0.16. Hgb 13.3 and Plt 126 K (down a little).  Waiting on CVTS consult. Goal of Therapy:  Heparin level 0.3-0.7 units/ml Monitor platelets by anticoagulation protocol: Yes   Plan:  1) Increase heparin to 1400 units/hr 2) 8 hr heparin level 3) F/u CABG plan  Hiroko Tregre, Tsz-Yin 11/26/2013,8:51 AM

## 2013-11-26 NOTE — Progress Notes (Signed)
ANTICOAGULATION CONSULT NOTE - Follow Up Consult  Pharmacy Consult for heparin Indication: atrial fibrillation  Allergies  Allergen Reactions  . Acetaminophen-Codeine     Extreme constipation   . Amoxicillin Other (See Comments)    Unknown-maybe a rash?  . Diovan [Valsartan] Other (See Comments)    Unknown     Patient Measurements: Height: 5' 7.5" (171.5 cm) Weight: 273 lb 5.9 oz (124 kg) IBW/kg (Calculated) : 67.25 Heparin Dosing Weight: 95 kg  Vital Signs: Temp: 99 F (37.2 C) (07/01 2037) Temp src: Oral (07/01 2037) BP: 138/56 mmHg (07/01 2037) Pulse Rate: 73 (07/01 2037)  Labs:  Recent Labs  11/25/13 1030 11/26/13 0530 11/26/13 1054 11/26/13 2045  HGB  --  13.3  --   --   HCT  --  38.2*  --   --   PLT  --  126*  --   --   LABPROT 13.2  --   --   --   INR 1.00  --   --   --   HEPARINUNFRC  --   --  0.16* 0.39  CREATININE  --  1.16  --   --     Estimated Creatinine Clearance: 73.3 ml/min (by C-G formula based on Cr of 1.16).   Medications:  Scheduled:  . amLODipine  10 mg Oral Daily  . aspirin EC  81 mg Oral Daily  . atorvastatin  20 mg Oral q1800  . fluticasone  2 spray Each Nare Daily  . loratadine  10 mg Oral Daily  . metoprolol succinate  50 mg Oral Daily  . multivitamin with minerals  1 tablet Oral Daily  . pantoprazole  40 mg Oral Daily  . potassium chloride SA  20 mEq Oral Daily  . trandolapril  4 mg Oral Daily  . triamterene-hydrochlorothiazide  1 tablet Oral Daily   Infusions:  . heparin 1,400 Units/hr (11/26/13 1826)    Assessment: 73 yo male with afib is currently on heparin. Level is therapeutic now. Awaiting CABG nex week.   Goal of Therapy:  Heparin level 0.3-0.7 units/ml Monitor platelets by anticoagulation protocol: Yes   Plan:  1) Cont heparin to 1400 units/hr 2) Confirm level in AM

## 2013-11-26 NOTE — Care Management Note (Addendum)
    Page 1 of 2   12/05/2013     5:07:54 PM CARE MANAGEMENT NOTE 12/05/2013  Patient:  Brandon Klein, Brandon Klein   Account Number:  [id number]  Date Initiated:  11/26/2013  Documentation initiated by:  HUTCHINSON,CRYSTAL  Subjective/Objective Assessment:   Abnormal Stress test  LEFT HEART CATHETERIZATION WITH CORONARY ANGIOGRAM     Action/Plan:   CM to follow for disposition needs   Anticipated DC Date:  12/06/2013   Anticipated DC Plan:  [place]  CM consult      [facility] Choice  HOME HEALTH   Choice offered to / List presented to:  C-1 Patient   DME arranged  [facility]assie Moselle      DME agency  [place] arranged  HH-1 RN  [place].   Status of service:  In process, will continue to follow Medicare Important Message given?  YES (If response is "NO", the following Medicare IM given date fields will be blank) Date Medicare IM given:  11/28/2013 Medicare IM given by:  Elissa Hefty Date Additional Medicare IM given:  12/04/2013 Additional Medicare IM given by:  Ellan Lambert  Discharge Disposition:  [place]  Per UR Regulation:  Reviewed for med. necessity/level of care/duration of stay  If discussed at [place] of Stay Meetings, dates discussed:    Comments:  Crystal Hutchinson RN, BSN, [place], CCM  Nurse - Case Manager, (Unit [phone]  11/26/2013  12/05/13 Ellan Lambert, RN, BSN [phone] Met with pt to discuss dc needs.  Pt requests RW for home, and is open to home health follow up at dc.  Will arrange HHRN and HHPT; referral to [facility], per pt choice.  Start of care 24-48h post dc date.  Referral to [facility] for DME needs.

## 2013-11-26 NOTE — Progress Notes (Signed)
Patient: Brandon AhmadiJames F Klein / Admit Date: 11/25/2013 / Date of Encounter: 11/26/2013, 6:21 AM  Subjective  No complaints. No CP or SOB.  Objective   Telemetry: NSR intermittent a pacing, v pacing, 2 PVC triplets separated by one beat  Physical Exam: Blood pressure 127/37, pulse 61, temperature 98.2 F (36.8 C), temperature source Oral, resp. rate 18, height 5' 7.5" (1.715 m), weight 273 lb 5.9 oz (124 kg), SpO2 95.00%. General: Well developed, well nourished WM, in no acute distress. Head: Normocephalic, atraumatic, sclera non-icteric, no xanthomas, nares are without discharge. Neck: Negative for carotid bruits. JVP not elevated. Lungs: Clear bilaterally to auscultation without wheezes, rales, or rhonchi. Breathing is unlabored. Heart: RRR S1 S2, mild systolic murmur without rubs or gallops.  Abdomen: Soft, non-tender, non-distended with normoactive bowel sounds. No rebound/guarding. Extremities: No clubbing or cyanosis. No edema. Distal pedal pulses are 2+ and equal bilaterally. R groin without hematoma, ecchymosis or bruit. Neuro: Alert and oriented X 3. Moves all extremities spontaneously. Psych:  Responds to questions appropriately with a normal affect.   Intake/Output Summary (Last 24 hours) at 11/26/13 [id number] Last data filed at 11/26/13 0500  Gross per 24 hour  Intake 295.14 ml  Output      0 ml  Net 295.14 ml    Inpatient Medications:  . amLODipine  10 mg Oral Daily  . atorvastatin  20 mg Oral q1800  . metoprolol succinate  50 mg Oral Daily  . pantoprazole  40 mg Oral Daily  . potassium chloride SA  20 mEq Oral Daily  . trandolapril  4 mg Oral Daily  . triamterene-hydrochlorothiazide  1 tablet Oral Daily   Infusions:  . heparin 1,150 Units/hr (11/26/13 0500)    Labs:  Recent Labs  11/26/13 0530  NA 139  K 3.9  CL 101  CO2 26  GLUCOSE 119*  BUN 19  CREATININE 1.16  CALCIUM 8.9     Recent Labs  11/26/13 0530  WBC 5.7  HGB 13.3  HCT 38.2*  MCV 86.6  PLT  126*     Radiology/Studies:  Cardiac catheterization this admission, please see full report     Assessment and Plan  1. 3V CAD (prior hx of LAD stent 1997) with class III angina 2. HTN 3. Dyslipidemia 4. PAF, on Coumadin at home, maintaining NSR 5. Mild AS by echo 11/20/13; EF 55-60% 6. Hyperglycemia 7. 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 2D echo ordered but patient just had this done 6/25. Have discontinued order and will ask MD to clarify if there is further need for repeat. Continue aspirin, BB, statin, and other home med regimen. Consider statin titration. He has an NMR Lipoprofile pending in Epic from outpatient setting at this time. Coumadin has been stopped and he is on heparin per pharmacy. Check A1C given hyperglycemia.  Signed, Ronie Spiesayna Dunn PA-C   I have seen and evaluated the patient this AM along with Dayna Dunn PA-C. I agree with her findings, examination as well as impression recommendations.  Admitted after Dx LHC yesterday revealed severe prox LAD stenosis & ISR involving 2 Diagonal branches, mid Large OM/LPL & distal RCA into PDA.  I reviewed the films yesterday with Dr. Mayford Knifeurner & agree with CVTS consult.  Just had echo, so no need to repeat. Limit activities to avoid angina, but with severity of CAD & the crescendo nature of his symptoms, best case would be CABG this admission.  Continue current meds - BP is stable &  on statin. Agree with evaluating Hyperglycemia -- 119 CBG is almost diagnostic for DM, (at least Metabolic Syndrome with HTN & Obesity) --> this re-enforces the decision to recommend CABG.  Consider SSI.    Marykay Lex, M.D., M.S. Interventional Cardiologist   Pager # [phone] 11/26/2013

## 2013-11-26 NOTE — Progress Notes (Signed)
[address]       [place] [address]             [phone]        AASHISH HAMM [facility] Medical Record #[id number] Date of Birth: 09-18-1940  Referring: No ref. provider found Primary Care: Gaye Alken, MD  Chief Complaint:   No chief complaint on file.  chest pain positive stress test Patient examined, coronary angiogram and echocardiogram reviewed  History of Present Illness:     73 year old Caucasian obese male.controlled diabetic with history of coronary disease status post PCI 1997-LAD and 2002-circumflex. He presented recently with recurrent exertional angina increasing in severity and frequency. Stress test was significantly positive with symptomatic chest pain and hypotension requiring IV fluids. Subsequent cardiac catheterization demonstrated severe three-vessel coronary disease with high-grade proximal LAD stenosis and in-stent stenosis, high-grade mid to distal circumflex stenosis, high-grade diagonal stenosis and moderate stenosis of the distal right coronary. LV function fairly well-preserved. Patient has history of mild aortic stenosis first made in 2013 by echo and without significant progression with mean gradient of 15 mm mercury. Patient has had paroxysmal atrial fibrillation has been on Coumadin for the past 2 years. 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 No bleeding complications from Coumadin. Because of the patient's positive stress test, symptoms, and severe three-vessel CAD was felt to be a candidate for surgical coronary revascularization.  The patient's mother died oat a young age of heart disease.  Current Activity/ Functional Status: Retired Scientist, product/process development, performs yard work and plays golf  periodically   Zubrod Score: At the time of surgery this patient's most appropriate activity status/level should be described as: []     0    Normal activity, no symptoms []      1    Restricted in physical strenuous activity but ambulatory, able to do out light work [x]     2    Ambulatory and capable of self care, unable to do work activities, up and about                 more than 50%  Of the time                            []     3    Only limited self care, in bed greater than 50% of waking hours []     4    Completely disabled, no self care, confined to bed or chair []     5    Moribund  Past Medical History  Diagnosis Date  . Syncope     Vasovagal With documented bradycardia  . Pacemaker   . Obesity   . HTN (hypertension)   . HLD (hyperlipidemia)   . DJD (degenerative joint disease)   . GERD (gastroesophageal reflux disease)   . Impaired fasting glucose     A1C check every year  . PAF (paroxysmal atrial fibrillation)     on chronic systemic anticoagulation  . Coronary artery disease     s/p PCI of LAD 1997 and cutting balloon angioplasty to the left circ in 2002  . Dysrhythmia     hx of atrial fibrilation  . Heart murmur     Past Surgical History  Procedure Laterality Date  . Cholecystectomy    . Knee arthroscopy  2000    Left  . Angioplasty  1997  .  Pacemaker insertion  2002    History  Smoking status  . Former Smoker  . Quit date: 05/29/1966  Smokeless tobacco  . Never Used    History  Alcohol Use  . Yes    Comment: ocassional    History   Social History  . Marital Status: Married    Spouse Name: N/A    Number of Children: N/A  . Years of Education: N/A   Occupational History  . Not on file.   Social History Main Topics  . Smoking status: Former Smoker    Quit date: 05/29/1966  . Smokeless tobacco: Never Used  . Alcohol Use: Yes     Comment: ocassional  . Drug Use: No  . Sexual Activity: Not on file   Other Topics Concern  . Not on file   Social History Narrative  . No narrative on file    Allergies  Allergen Reactions  . Amoxicillin Other (See Comments)    Unknown-maybe a rash?  . Diovan [Valsartan] Other  (See Comments)    Unknown     Current Facility-Administered Medications  Medication Dose Route Frequency Provider Last Rate Last Dose  . acetaminophen (TYLENOL) tablet 650 mg  650 mg Oral Q4H PRN [name], MD   650 mg at 11/26/13 0159  . amLODipine (NORVASC) tablet 10 mg  10 mg Oral Daily [name], MD   10 mg at 11/26/13 0858  . aspirin EC tablet 81 mg  81 mg Oral Daily Dayna N Dunn, PA-C   81 mg at 11/26/13 0858  . atorvastatin (LIPITOR) tablet 20 mg  20 mg Oral q1800 [name], MD      . heparin ADULT infusion 100 units/mL (25000 units/250 mL)  1,400 Units/hr Intravenous Continuous [name], MD 14 mL/hr at 11/26/13 1240 1,400 Units/hr at 11/26/13 1240  . metoprolol succinate (TOPROL-XL) 24 hr tablet 50 mg  50 mg Oral Daily [name], MD   50 mg at 11/26/13 0859  . ondansetron (ZOFRAN) injection 4 mg  4 mg Intravenous Q6H PRN [name], MD      . pantoprazole (PROTONIX) EC tablet 40 mg  40 mg Oral Daily [name], MD   40 mg at 11/26/13 0859  . potassium chloride SA (K-DUR,KLOR-CON) CR tablet 20 mEq  20 mEq Oral Daily [name], MD   20 mEq at 11/26/13 0859  . trandolapril (MAVIK) tablet 4 mg  4 mg Oral Daily [name], MD   4 mg at 11/26/13 0859  . triamterene-hydrochlorothiazide (MAXZIDE) 75-50 MG per tablet 1 tablet  1 tablet Oral Daily [name], MD   1 tablet at 11/26/13 4132    Prescriptions prior to admission  Medication Sig Dispense Refill  . amLODipine (NORVASC) 10 MG tablet Take 10 mg by mouth daily.        Marland Kitchen atorvastatin (LIPITOR) 20 MG tablet Take 20 mg by mouth daily.        . cholecalciferol (GNP VITAMIN D) 400 UNITS TABS Take 400 Units by mouth daily.        . fexofenadine (ALLEGRA) 180 MG tablet Take 180 mg by mouth daily.        . fluticasone (FLONASE) 50 MCG/ACT nasal spray Place 2 sprays into the nose daily.        . Glucosamine-Chondroit-Vit C-Mn (GLUCOSAMINE CHONDR 1500 COMPLX) CAPS Take 1 capsule by mouth daily.        Marland Kitchen L-Lysine 500  MG CAPS Take 1 capsule by mouth daily.        . meclizine (ANTIVERT) 25 MG tablet Take 25 mg by mouth 3 (three) times daily as needed for dizziness.       . metoprolol succinate (TOPROL-XL) 50 MG 24 hr tablet Take 1 tablet (50 mg total) by mouth daily. Take with or immediately following a meal.  90 tablet  3  . Multiple Vitamin (MULTIVITAMIN) tablet Take 1 tablet by mouth daily.        . nitroGLYCERIN (NITROSTAT) 0.4 MG SL tablet Place 0.4 mg under the tongue every 5 (five) minutes as needed for chest pain.       . Omega-3 Fatty Acids (FISH OIL) 500 MG CAPS Take 1,000 mg by mouth 2 (two) times daily.      . pantoprazole (PROTONIX) 40 MG tablet Take 40 mg by mouth daily.        . potassium chloride SA (K-DUR,KLOR-CON) 20 MEQ tablet Take 20 mEq by mouth daily.        . trandolapril (MAVIK) 4 MG tablet Take 4 mg by mouth daily.        Marland Kitchen triamterene-hydrochlorothiazide (MAXZIDE) 75-50 MG per tablet Take 1 tablet by mouth daily.          Family History  Problem Relation Age of Onset  . Leukemia Father     deceased  . Colon cancer Mother     deceased  . Heart attack Mother   . Heart attack Brother     deceased     Review of Systems:     Cardiac Review of Systems: Y or N  Chest Pain [  yes  ]  Resting SOB [ no  ] Exertional SOB  Mahler.Beck  ]  Orthopnea [ no ]   Pedal Edema [  no ]    Palpitations [ yes ] Syncope  [no  ]   Presyncope [ yes-during stress test  ]  General Review of Systems: [Y] = yes [  ]=no Constitional: recent weight change [  ]; anorexia [  ]; fatigue [  ]; nausea [  ]; night sweats [  ]; fever [  ]; or chills [  ]        his weight has been stable                                                       Dental: poor dentition[  ]; Last Dentist visit: Every 6 months-need some implants later  Eye : blurred vision [  ]; diplopia [   ]; vision changes [  ];  Amaurosis fugax[  ]; Resp: cough [  ];  wheezing[  ];  hemoptysis[  ]; shortness of breath[ yes ]; paroxysmal  nocturnal dyspnea[  ]; dyspnea on exertion[ yes ]; or orthopnea[  ];  GI:  gallstones[ yes-status post cholecystectomy ], vomiting[  ];  dysphagia[  ]; melena[  ];  hematochezia [  ]; heartburn[  ];   Hx of  Colonoscopy[  ]; GU: kidney stones [  ]; hematuria[  ];   dysuria [  ];  nocturia[  ];  history of     obstruction [  ]; urinary frequency [  ]             Skin: rash,  swelling[  ];, hair loss[  ];  peripheral edema[  ];  or itching[  ]; Musculosketetal: myalgias[  ];  joint swelling[  ];  joint erythema[  ];  joint pain[  ];  back pain[  ];  Heme/Lymph: bruising[  ];  bleeding[  ];  anemia[  ];  Neuro: TIA[  ];  headaches[  ];  stroke[  ];  vertigo[  ];  seizures[  ];   paresthesias[  ];  difficulty walking[  ];  Psych:depression[  ]; anxiety[  ]; the patient is right-hand dominant  Endocrine: diabetes[borderline diabetic diet controlled  ];  thyroid dysfunction[  ];  Immunizations: Flu [  ]; Pneumococcal[  ];  Other: Unknown reaction to penicillin greater than 40 years ago  Physical Exam: BP 142/68  Pulse 71  Temp(Src) 98 F (36.7 C) (Oral)  Resp 18  Ht 5' 7.5" (1.715 m)  Wt 273 lb 5.9 oz (124 kg)  BMI 42.16 kg/m2  SpO2 98%  Gen. appearance-morbidly obese male in his post cath unit no distress. Currently on IV heparin HEENT normocephalic dentition adequate pupils equal Neck without JVD mass or bruit, no palpable adenopathy Thorax with clear breath sounds no deformity or tenderness Cardiac-irregular heart rhythm soft 2/6 systolic ejection murmur no gallop Abdomen-well-healed right subcostal surgical scar, obese nontender without pulsatile mass Extremities-no clubbing cyanosis tenderness or edema Vascular-palpable pulses in extremities no venous insufficiency of the legs Neurologic-no focal motor deficit   Diagnostic Studies & Laboratory data:   Coronary angiogram 2-D echocardiogram reviewed showing severe three-vessel disease, mild aortic stenosis  Recent Radiology Findings:    No results found.  Chest x-ray pending Recent Lab Findings: Lab Results  Component Value Date   WBC 5.7 11/26/2013   HGB 13.3 11/26/2013   HCT 38.2* 11/26/2013   PLT 126* 11/26/2013   GLUCOSE 119* 11/26/2013   CHOL 116 07/14/2013   TRIG 201.0* 07/14/2013   HDL 36.10* 07/14/2013   LDLDIRECT 47.9 07/14/2013   ALT 30 11/19/2013   NA 139 11/26/2013   K 3.9 11/26/2013   CL 101 11/26/2013   CREATININE 1.16 11/26/2013   BUN 19 11/26/2013   CO2 26 11/26/2013   INR 1.00 11/25/2013      Assessment / Plan:     Agree with recommendation for multivessel CABG due to the patient's clinical symptoms and pattern of disease. We'll plan bypass grafts the LAD, diagonal, cervix marginal, and distal RCA. I discussed the procedure in detail the patient including the indications benefits alternatives and risks. He reached to proceed with surgery, to remain on IV heparin until surgery which we scheduled for Monday, July 7.      @ME1 @ 11/26/2013 2:26 PM

## 2013-11-27 ENCOUNTER — Inpatient Hospital Stay (HOSPITAL_COMMUNITY): Payer: Medicare Other

## 2013-11-27 DIAGNOSIS — Z7901 Long term (current) use of anticoagulants: Secondary | ICD-10-CM

## 2013-11-27 DIAGNOSIS — Z6841 Body Mass Index (BMI) 40.0 and over, adult: Secondary | ICD-10-CM | POA: Diagnosis not present

## 2013-11-27 DIAGNOSIS — I2510 Atherosclerotic heart disease of native coronary artery without angina pectoris: Secondary | ICD-10-CM | POA: Diagnosis not present

## 2013-11-27 DIAGNOSIS — T82897A Other specified complication of cardiac prosthetic devices, implants and grafts, initial encounter: Secondary | ICD-10-CM | POA: Diagnosis not present

## 2013-11-27 DIAGNOSIS — D696 Thrombocytopenia, unspecified: Secondary | ICD-10-CM | POA: Diagnosis not present

## 2013-11-27 LAB — BLOOD GAS, ARTERIAL
Acid-Base Excess: 1.3 mmol/L (ref 0.0–2.0)
Bicarbonate: 25 mEq/L — ABNORMAL HIGH (ref 20.0–24.0)
Drawn by: 257081
FIO2: 0.21 %
O2 Saturation: 98.3 %
Patient temperature: 98.6
TCO2: 26.2 mmol/L (ref 0–100)
pCO2 arterial: 37.6 mmHg (ref 35.0–45.0)
pH, Arterial: 7.438 (ref 7.350–7.450)
pO2, Arterial: 80.2 mmHg (ref 80.0–100.0)

## 2013-11-27 LAB — PULMONARY FUNCTION TEST
DL/VA % pred: 116 %
DL/VA: 5.18 ml/min/mmHg/L
DLCO cor % pred: 140 %
DLCO cor: 40.64 ml/min/mmHg
DLCO unc % pred: 135 %
DLCO unc: 39.19 ml/min/mmHg
FEF 25-75 Pre: 2.9 L/sec
FEF2575-%Pred-Pre: 138 %
FEV1-%Pred-Pre: 129 %
FEV1-Pre: 3.66 L
FEV1FVC-%Pred-Pre: 104 %
FEV6-%Pred-Pre: 131 %
FEV6-Pre: 4.8 L
FEV6FVC-%Pred-Pre: 106 %
FVC-%Pred-Pre: 123 %
FVC-Pre: 4.81 L
Pre FEV1/FVC ratio: 76 %
Pre FEV6/FVC Ratio: 100 %
RV % pred: 128 %
RV: 3.06 L
TLC % pred: 123 %
TLC: 8.08 L

## 2013-11-27 LAB — BASIC METABOLIC PANEL
Anion gap: 14 (ref 5–15)
BUN: 19 mg/dL (ref 6–23)
CO2: 24 mEq/L (ref 19–32)
Calcium: 9 mg/dL (ref 8.4–10.5)
Chloride: 100 mEq/L (ref 96–112)
Creatinine, Ser: 1.12 mg/dL (ref 0.50–1.35)
GFR calc Af Amer: 74 mL/min — ABNORMAL LOW (ref >90)
GFR calc non Af Amer: 64 mL/min — ABNORMAL LOW (ref >90)
Glucose, Bld: 134 mg/dL — ABNORMAL HIGH (ref 70–99)
Potassium: 3.4 mEq/L — ABNORMAL LOW (ref 3.7–5.3)
Sodium: 138 mEq/L (ref 137–147)

## 2013-11-27 LAB — TSH: TSH: 1.35 u[IU]/mL (ref 0.350–4.500)

## 2013-11-27 LAB — CBC
HCT: 39.3 % (ref 39.0–52.0)
Hemoglobin: 13.4 g/dL (ref 13.0–17.0)
MCH: 29.5 pg (ref 26.0–34.0)
MCHC: 34.1 g/dL (ref 30.0–36.0)
MCV: 86.6 fL (ref 78.0–100.0)
Platelets: 125 10*3/uL — ABNORMAL LOW (ref 150–400)
RBC: 4.54 MIL/uL (ref 4.22–5.81)
RDW: 13.7 % (ref 11.5–15.5)
WBC: 5.3 10*3/uL (ref 4.0–10.5)

## 2013-11-27 LAB — ABO/RH: ABO/RH(D): O POS

## 2013-11-27 LAB — HEPARIN LEVEL (UNFRACTIONATED): Heparin Unfractionated: 0.34 IU/mL (ref 0.30–0.70)

## 2013-11-27 MED ORDER — CHLORHEXIDINE GLUCONATE CLOTH 2 % EX PADS
6.0000 | MEDICATED_PAD | Freq: Every day | CUTANEOUS | Status: DC
  Administered 2013-11-27 – 2013-11-30 (×3): 6 via TOPICAL

## 2013-11-27 MED ORDER — MUPIROCIN 2 % EX OINT
1.0000 "application " | TOPICAL_OINTMENT | Freq: Two times a day (BID) | CUTANEOUS | Status: DC
  Administered 2013-11-27 – 2013-11-30 (×8): 1 via NASAL
  Filled 2013-11-27 (×2): qty 22

## 2013-11-27 NOTE — Progress Notes (Signed)
    Subjective:  No chest   Objective:  Vital Signs in the last 24 hours: Temp:  [97.3 F (36.3 C)-99 F (37.2 C)] 97.3 F (36.3 C) (07/02 0601) Pulse Rate:  [65-73] 67 (07/02 0601) Resp:  [16-20] 20 (07/02 0601) BP: (138-166)/(56-85) 160/82 mmHg (07/02 0601) SpO2:  [96 %-99 %] 98 % (07/02 0601) Weight:  [271 lb 2.7 oz (123 kg)] 271 lb 2.7 oz (123 kg) (07/02 0005)  Intake/Output from previous day:  Intake/Output Summary (Last 24 hours) at 11/27/13 0932 Last data filed at 11/26/13 1500  Gross per 24 hour  Intake    240 ml  Output      0 ml  Net    240 ml    Physical Exam: General appearance: alert, cooperative, no distress and moderately obese Lungs: clear to auscultation bilaterally Heart: regular rate and rhythm and 1/6 systolic murmur AOv Extremities: Rt groin without heamtoma   Rate: 68  Rhythm: normal sinus rhythm  Lab Results:  Recent Labs  11/26/13 0530 11/27/13 0338  WBC 5.7 5.3  HGB 13.3 13.4  PLT 126* 125*    Recent Labs  11/26/13 0530 11/27/13 0338  NA 139 138  K 3.9 3.4*  CL 101 100  CO2 26 24  GLUCOSE 119* 134*  BUN 19 19  CREATININE 1.16 1.12   No results found for this basename: TROPONINI, CK, MB,  in the last 72 hours  Recent Labs  11/25/13 1030  INR 1.00    Imaging: Imaging results have been reviewed  Cardiac Studies: Echo 11/20/13- Study Conclusions  - Left ventricle: The cavity size was mildly dilated. Wall thickness was normal. Systolic function was normal. The estimated ejection fraction was in the range of 55% to 60%. Doppler parameters are consistent with abnormal left ventricular relaxation (grade 1 diastolic dysfunction). - Aortic valve: AV is thickened, calcified with restricted motion. Peak and mean gradients through the valve are 27 and 17 mm Hg respectively consistent with mild AS> Mean gradient (S): 16 mm Hg. Peak gradient (S): 28 mm Hg. - Left atrium: The atrium    Assessment/Plan:  Pt is a 73 y.o. male  with a history of prior CAD, PAF on chronic Coumadin, and mild AS as noted. He was seen as an OP 11/19/13 and related a history of DOE. Echo and Myoview were ordered. His Myoview (2 day) was abnormal. His Coumadin was held and he was admitted for cath 11/25/13. This revealed progression with 3V CAD. He is for CABG Monday July 7th. He will remain hospitalized, on Heparin till then.     Principal Problem:   Angina, class II Active Problems:   CAD S/P LAD PCI in '97- progression at cath 11/26/13- for CABG 12/02/13   Abnormal nuclear stress test - HIGH RISK - inferoapical ischemia   PAF (paroxysmal atrial fibrillation)   Chronic anticoagulation   Bradycardia s/p MDT pacemaker   Dyslipidemia   Mild aortic stenosis   HTN (hypertension)   Hyperglycemia    PLAN: Heparin till CABG Monday. Tx telemetry.  Corine Shelter PA-C Beeper [phone] 11/27/2013, 9:32 AM Patient seen and examined. I agree with the assessment and plan as detailed above. See also my additional thoughts below.   The patient is stable. All plans are in place. Continue to monitor him carefully until bypass surgery on Monday.  Willa Rough, MD, [facility] 11/27/2013 9:49 AM

## 2013-11-27 NOTE — Progress Notes (Signed)
ANTICOAGULATION CONSULT NOTE - Follow Up Consult  Pharmacy Consult for heparin Indication: atrial fibrillation  Allergies  Allergen Reactions  . Acetaminophen-Codeine     Extreme constipation   . Amoxicillin Other (See Comments)    Unknown-maybe a rash?  . Diovan [Valsartan] Other (See Comments)    Unknown     Patient Measurements: Height: 5' 7.5" (171.5 cm) Weight: 271 lb 2.7 oz (123 kg) IBW/kg (Calculated) : 67.25 Heparin Dosing Weight: 95 kg  Vital Signs: Temp: 97.3 F (36.3 C) (07/02 0601) Temp src: Oral (07/02 0601) BP: 160/82 mmHg (07/02 0601) Pulse Rate: 67 (07/02 0601)  Labs:  Recent Labs  11/25/13 1030 11/26/13 0530 11/26/13 1054 11/26/13 2045 11/27/13 0338  HGB  --  13.3  --   --  13.4  HCT  --  38.2*  --   --  39.3  PLT  --  126*  --   --  125*  LABPROT 13.2  --   --   --   --   INR 1.00  --   --   --   --   HEPARINUNFRC  --   --  0.16* 0.39 0.34  CREATININE  --  1.16  --   --  1.12    Estimated Creatinine Clearance: 75.6 ml/min (by C-G formula based on Cr of 1.12).   Medications:  Scheduled:  . amLODipine  10 mg Oral Daily  . aspirin EC  81 mg Oral Daily  . atorvastatin  20 mg Oral q1800  . Chlorhexidine Gluconate Cloth  6 each Topical Daily  . fluticasone  2 spray Each Nare Daily  . loratadine  10 mg Oral Daily  . metoprolol succinate  50 mg Oral Daily  . multivitamin with minerals  1 tablet Oral Daily  . mupirocin ointment  1 application Nasal BID  . pantoprazole  40 mg Oral Daily  . potassium chloride SA  20 mEq Oral Daily  . trandolapril  4 mg Oral Daily  . triamterene-hydrochlorothiazide  1 tablet Oral Daily   Infusions:  . heparin 1,400 Units/hr (11/26/13 2000)    Assessment: 73 yo male with afib pending CABG is currently on therapeutic heparin.  Heparin level was 0.34. Goal of Therapy:  Heparin level 0.3-0.7 units/ml Monitor platelets by anticoagulation protocol: Yes   Plan:  1) Continue heparin at 1400 units/hr 2) daily  heparin level 3) Monitor daily CBC and s/s of bleeding 4) CABG on Monday   Courtney Fenlon, Tsz-Yin 11/27/2013,8:40 AM

## 2013-11-28 DIAGNOSIS — I2510 Atherosclerotic heart disease of native coronary artery without angina pectoris: Secondary | ICD-10-CM

## 2013-11-28 LAB — BASIC METABOLIC PANEL
Anion gap: 15 (ref 5–15)
BUN: 21 mg/dL (ref 6–23)
CO2: 24 mEq/L (ref 19–32)
Calcium: 9.3 mg/dL (ref 8.4–10.5)
Chloride: 98 mEq/L (ref 96–112)
Creatinine, Ser: 1.2 mg/dL (ref 0.50–1.35)
GFR calc Af Amer: 68 mL/min — ABNORMAL LOW (ref >90)
GFR calc non Af Amer: 59 mL/min — ABNORMAL LOW (ref >90)
Glucose, Bld: 140 mg/dL — ABNORMAL HIGH (ref 70–99)
Potassium: 3.6 mEq/L — ABNORMAL LOW (ref 3.7–5.3)
Sodium: 137 mEq/L (ref 137–147)

## 2013-11-28 LAB — CBC
HCT: 37.8 % — ABNORMAL LOW (ref 39.0–52.0)
Hemoglobin: 13 g/dL (ref 13.0–17.0)
MCH: 29.5 pg (ref 26.0–34.0)
MCHC: 34.4 g/dL (ref 30.0–36.0)
MCV: 85.7 fL (ref 78.0–100.0)
Platelets: 130 10*3/uL — ABNORMAL LOW (ref 150–400)
RBC: 4.41 MIL/uL (ref 4.22–5.81)
RDW: 13.9 % (ref 11.5–15.5)
WBC: 5.4 10*3/uL (ref 4.0–10.5)

## 2013-11-28 LAB — HEPARIN LEVEL (UNFRACTIONATED): Heparin Unfractionated: 0.39 IU/mL (ref 0.30–0.70)

## 2013-11-28 NOTE — Progress Notes (Signed)
Patient ID: Brandon Klein, male   DOB: 1941-03-08, 73 y.o.   MRN: [id number]      Subjective:   No complaints overnight   Objective:   Temp:  [97.8 F (36.6 C)-98.2 F (36.8 C)] 97.8 F (36.6 C) (07/03 0353) Pulse Rate:  [67-76] 76 (07/03 0353) Resp:  [18-20] 18 (07/03 0353) BP: (120-129)/(53-64) 129/53 mmHg (07/03 0353) SpO2:  [97 %-99 %] 99 % (07/03 0353) Last BM Date: 11/27/13  Filed Weights   11/25/13 1014 11/26/13 0013 11/27/13 0005  Weight: 269 lb (122.018 kg) 273 lb 5.9 oz (124 kg) 271 lb 2.7 oz (123 kg)    Intake/Output Summary (Last 24 hours) at 11/28/13 0814 Last data filed at 11/28/13 0300  Gross per 24 hour  Intake 771.33 ml  Output      3 ml  Net 768.33 ml    Telemetry: NSR, occas PVCs  Exam:  General: NAD  Resp:CTAB  Cardiac:RRR, 2/6 systolic murmur RUSB, no JVD  GI: abdomen soft, NT, ND  MSK: no LE edema  Neuro: no focal deficits  Psych: appropriate affect  Lab Results:  Basic Metabolic Panel:  Recent Labs Lab 11/26/13 0530 11/27/13 0338 11/28/13 0500  NA 139 138 137  K 3.9 3.4* 3.6*  CL 101 100 98  CO2 26 24 24   GLUCOSE 119* 134* 140*  BUN 19 19 21   CREATININE 1.16 1.12 1.20  CALCIUM 8.9 9.0 9.3    Liver Function Tests: No results found for this basename: AST, ALT, ALKPHOS, BILITOT, PROT, ALBUMIN,  in the last 168 hours  CBC:  Recent Labs Lab 11/26/13 0530 11/27/13 0338 11/28/13 0500  WBC 5.7 5.3 5.4  HGB 13.3 13.4 13.0  HCT 38.2* 39.3 37.8*  MCV 86.6 86.6 85.7  PLT 126* 125* 130*    Cardiac Enzymes: No results found for this basename: CKTOTAL, CKMB, CKMBINDEX, TROPONINI,  in the last 168 hours  BNP: No results found for this basename: PROBNP,  in the last 8760 hours  Coagulation:  Recent Labs Lab 11/25/13 1030  INR 1.00    ECG:   Medications:   Scheduled Medications: . amLODipine  10 mg Oral Daily  . aspirin EC  81 mg Oral Daily  . atorvastatin  20 mg Oral q1800  . Chlorhexidine Gluconate  Cloth  6 each Topical Daily  . fluticasone  2 spray Each Nare Daily  . loratadine  10 mg Oral Daily  . metoprolol succinate  50 mg Oral Daily  . multivitamin with minerals  1 tablet Oral Daily  . mupirocin ointment  1 application Nasal BID  . pantoprazole  40 mg Oral Daily  . potassium chloride SA  20 mEq Oral Daily  . trandolapril  4 mg Oral Daily  . triamterene-hydrochlorothiazide  1 tablet Oral Daily     Infusions: . heparin 1,400 Units/hr (11/27/13 1400)     PRN Medications:  acetaminophen, ondansetron (ZOFRAN) IV   Cath   HEMODYNAMICS: No performed due to inability to cross LV with pigtail catheter  ANGIOGRAPHIC DATA: The left main coronary artery is widely patent and trifurcates into an LAD, left circumflex and ramus arteries.  The left anterior descending artery has an 80% stenosis in the proximal portion before the LAD stent. There is a 90% instent restenosis within the body of the LAD stent. It then gives rise to a large diagonal Divit Stipp that has a 90% stenosis in the mid portion.  The Ramus is a moderate sized vessel that is patent and  distally bifurcates into 2 daughter vessels.  The left circumflex artery is a large vessel. Mid way down it gives rise to a very large OM1. It gives rise to a superior daughter Caylen Yardley which is patent. In the mid portion of the OM there is an 80-90% stenosis . Distally it gives rise to 2 more branches which are patent.  The right coronary artery is widely patent in the proximal and mid vessel and gives rise to a moderate sized acute RV marginal Sakira Dahmer which is patent. Distally the RCA has a 70% stenosis before giving rise to a PDA and PL branches.Marland Kitchen  LEFT VENTRICULOGRAM: Not performed due to inability to cross the AV  IMPRESSIONS:  1. Normal left main coronary artery. 2. 80% proximal LAD stenosis proximal to the stent and 90% instent restenosis of the LAD stent. 90% mid diagonal stenosis.  3. 80-90% mid OM stenosis in a very large  vessel. 4. 70% distal RCA RECOMMENDATION:  Films were reviewed with Dr. Herbie Baltimore. He has significant disease in his left circ/OM system as well as a complex sequential lesions in his LAD involving a prior stent and diagonal stenosis as well as RCA/PDA disease. Recommend CABG. Will admit and start on ASA/IV Heparin gtt and get CVTS consult,.   Assessment/Plan    73 yo male with prior history of CAD with recent episodes of CP and SOB, cath this admission with severe multivessel disease  1. CAD - symptoms of chest pain and SOB. MPI with evidence of ischemia, referred for cath - cath with severe multivessel disease as described in report above, CT surgery consulted for possible CABG. Plan for surgery Monday July 7th per there note - continue medical therapy including heparin       Dina Rich, M.D., F.A.C.C.

## 2013-11-28 NOTE — Progress Notes (Signed)
ANTICOAGULATION CONSULT NOTE - Follow Up Consult  Pharmacy Consult for heparin Indication: atrial fibrillation  Allergies  Allergen Reactions  . Acetaminophen-Codeine     Extreme constipation   . Amoxicillin Other (See Comments)    Unknown-maybe a rash?  . Diovan [Valsartan] Other (See Comments)    Unknown     Patient Measurements: Height: 5' 7.5" (171.5 cm) Weight: 271 lb 2.7 oz (123 kg) IBW/kg (Calculated) : 67.25 Heparin Dosing Weight: 95 kg  Vital Signs: Temp: 97.8 F (36.6 C) (07/03 0353) Temp src: Oral (07/03 0353) BP: 129/53 mmHg (07/03 0353) Pulse Rate: 76 (07/03 0353)  Labs:  Recent Labs  11/26/13 0530  11/26/13 2045 11/27/13 0338 11/28/13 0500  HGB 13.3  --   --  13.4 13.0  HCT 38.2*  --   --  39.3 37.8*  PLT 126*  --   --  125* 130*  HEPARINUNFRC  --   < > 0.39 0.34 0.39  CREATININE 1.16  --   --  1.12 1.20  < > = values in this interval not displayed.  Estimated Creatinine Clearance: 70.5 ml/min (by C-G formula based on Cr of 1.2).   Infusions:  . heparin 1,400 Units/hr (11/28/13 1018)    Assessment: 73 yo male with afib pending CABG is currently on therapeutic heparin.  Heparin level 0.39 is therapeutic.  No bleeding reported. Hg stable, pltc remains low at 130.  Goal of Therapy:  Heparin level 0.3-0.7 units/ml Monitor platelets by anticoagulation protocol: Yes   Plan:  1) Continue heparin at 1400 units/hr 2) daily heparin level 3) Monitor daily CBC and s/s of bleeding 4) CABG on Monday Herby Abraham, Pharm.D. [phone] 11/28/2013 10:57 AM

## 2013-11-28 NOTE — Progress Notes (Signed)
Inpatient Diabetes Program Recommendations  AACE/ADA: New Consensus Statement on Inpatient Glycemic Control (2013)  Target Ranges:  Prepandial:   less than 140 mg/dL      Peak postprandial:   less than 180 mg/dL (1-2 hours)      Critically ill patients:  140 - 180 mg/dL    Results for CYLE, HAFLEY (MRN [id number]) as of 11/28/2013 07:32  Ref. Range 11/26/2013 10:54  Hemoglobin A1C Latest Range: <5.7 % 6.5 (H)  Reason for assessment; elevated A1C  Diabetes history: hyperlipidemia noted, A1C 6.5% Outpatient Diabetes medications: none Patient noted to have an elevated A1C of 6.5%- based on ADA guidelines, this is indicative of a diagnosis of diabetes.  Has this been discussed with the patient? Patient may benefit from outpatient diabetes education on discharge. Will follow up post CABG.   Susette Racer, RN, BA, MHA, CDE Diabetes Coordinator Inpatient Diabetes Program  [phone] (Team Pager) [phone] [facility]) 11/28/2013 7:33 AM

## 2013-11-28 NOTE — Progress Notes (Signed)
Nursing note Patient ambulating in hallway in dependently 500 feet with family. Will monitor patient. Zowie Lundahl, Randall An RN

## 2013-11-28 NOTE — Progress Notes (Signed)
3 Days Post-Op Procedure(s) (LRB): LEFT HEART CATHETERIZATION WITH CORONARY ANGIOGRAM (N/A) Subjective: Recurrent unstable angina after prior PCI Now with 3 vessel CAD, mild AS by echocardiogram Borderline DM  A1c 6.5 Stable on iv heparin- takes coumadin at home for afib and has had perm pacemaker Objective: Vital signs in last 24 hours: Temp:  [97.8 F (36.6 C)-98.2 F (36.8 C)] 98.1 F (36.7 C) (07/03 1411) Pulse Rate:  [63-76] 63 (07/03 1411) Cardiac Rhythm:  [-] Normal sinus rhythm (07/03 0800) Resp:  [18-20] 20 (07/03 1411) BP: (109-129)/(47-64) 109/47 mmHg (07/03 1411) SpO2:  [97 %-100 %] 100 % (07/03 1411)  Hemodynamic parameters for last 24 hours:   nsr  Intake/Output from previous day: 07/02 0701 - 07/03 0700 In: 1009.3 [P.O.:340; I.V.:669.3] Out: 3 [Urine:3] Intake/Output this shift: Total I/O In: 734 [P.O.:720; I.V.:14] Out: 3 [Urine:3]  No hematoma from cath site  Lab Results:  Recent Labs  11/27/13 0338 11/28/13 0500  WBC 5.3 5.4  HGB 13.4 13.0  HCT 39.3 37.8*  PLT 125* 130*   BMET:  Recent Labs  11/27/13 0338 11/28/13 0500  NA 138 137  K 3.4* 3.6*  CL 100 98  CO2 24 24  GLUCOSE 134* 140*  BUN 19 21  CREATININE 1.12 1.20  CALCIUM 9.0 9.3    PT/INR: No results found for this basename: LABPROT, INR,  in the last 72 hours ABG    Component Value Date/Time   PHART 7.438 11/27/2013 1535   HCO3 25.0* 11/27/2013 1535   TCO2 26.2 11/27/2013 1535   O2SAT 98.3 11/27/2013 1535   CBG (last 3)  No results found for this basename: GLUCAP,  in the last 72 hours  Assessment/Plan: S/P Procedure(s) (LRB): LEFT HEART CATHETERIZATION WITH CORONARY ANGIOGRAM (N/A) CABG Mon am preCabg dopplers pending   LOS: 3 days    VAN TRIGT III,PETER 11/28/2013

## 2013-11-29 DIAGNOSIS — Z01810 Encounter for preprocedural cardiovascular examination: Secondary | ICD-10-CM

## 2013-11-29 LAB — CBC
HCT: 38.1 % — ABNORMAL LOW (ref 39.0–52.0)
Hemoglobin: 13 g/dL (ref 13.0–17.0)
MCH: 29.5 pg (ref 26.0–34.0)
MCHC: 34.1 g/dL (ref 30.0–36.0)
MCV: 86.4 fL (ref 78.0–100.0)
Platelets: 124 10*3/uL — ABNORMAL LOW (ref 150–400)
RBC: 4.41 MIL/uL (ref 4.22–5.81)
RDW: 13.9 % (ref 11.5–15.5)
WBC: 6.5 10*3/uL (ref 4.0–10.5)

## 2013-11-29 LAB — BASIC METABOLIC PANEL
Anion gap: 15 (ref 5–15)
BUN: 22 mg/dL (ref 6–23)
CO2: 24 mEq/L (ref 19–32)
Calcium: 9 mg/dL (ref 8.4–10.5)
Chloride: 102 mEq/L (ref 96–112)
Creatinine, Ser: 1.19 mg/dL (ref 0.50–1.35)
GFR calc Af Amer: 69 mL/min — ABNORMAL LOW (ref >90)
GFR calc non Af Amer: 59 mL/min — ABNORMAL LOW (ref >90)
Glucose, Bld: 120 mg/dL — ABNORMAL HIGH (ref 70–99)
Potassium: 4.1 mEq/L (ref 3.7–5.3)
Sodium: 141 mEq/L (ref 137–147)

## 2013-11-29 LAB — HEMOGLOBIN A1C
Hgb A1c MFr Bld: 6.3 % — ABNORMAL HIGH (ref <5.7)
Mean Plasma Glucose: 134 mg/dL — ABNORMAL HIGH (ref <117)

## 2013-11-29 LAB — HEPARIN LEVEL (UNFRACTIONATED): Heparin Unfractionated: 0.35 IU/mL (ref 0.30–0.70)

## 2013-11-29 LAB — APTT: aPTT: 61 seconds — ABNORMAL HIGH (ref 24–37)

## 2013-11-29 NOTE — Progress Notes (Signed)
11/29/2013 8:34 AM Nursing note Confirmed with vascular lab, pre-cabg dopplers to be completed today. Pt. Updated on plan of care.  Adrin Julian, Blanchard Kelch

## 2013-11-29 NOTE — Progress Notes (Addendum)
Pre-op Cardiac Surgery  Carotid Findings:  1-39% ICA stenosis.  Vertebral artery flow is antegrade.  Upper Extremity Right Left  Brachial Pressures 117 124  Radial Waveforms T T  Ulnar Waveforms T T  Palmar Arch (Allen's Test) WNL Doppler signal remains normal with radial compression and obliterates with ulnar compression   Findings:      Lower  Extremity Right Left  Dorsalis Pedis 130T 128T  Anterior Tibial    Posterior Tibial 127T 154T  Ankle/Brachial Indices >1.0 >1.0    Findings:

## 2013-11-29 NOTE — Progress Notes (Signed)
Patient ID: MANOJ CONDE, male   DOB: 07-Feb-1941, 73 y.o.   MRN: [id number]   Patient Name: Brandon Klein Date of Encounter: 11/29/2013     Principal Problem:   Angina, class II Active Problems:   Bradycardia s/p MDT pacemaker   CAD S/P LAD PCI in '97- progression at cath 11/26/13- for CABG 12/02/13   Dyslipidemia   Mild aortic stenosis   HTN (hypertension)   PAF (paroxysmal atrial fibrillation)   Abnormal nuclear stress test - HIGH RISK - inferoapical ischemia   Hyperglycemia   Chronic anticoagulation    SUBJECTIVE  No chest pain or sob.  CURRENT MEDS . amLODipine  10 mg Oral Daily  . aspirin EC  81 mg Oral Daily  . atorvastatin  20 mg Oral q1800  . Chlorhexidine Gluconate Cloth  6 each Topical Daily  . fluticasone  2 spray Each Nare Daily  . loratadine  10 mg Oral Daily  . metoprolol succinate  50 mg Oral Daily  . multivitamin with minerals  1 tablet Oral Daily  . mupirocin ointment  1 application Nasal BID  . pantoprazole  40 mg Oral Daily  . potassium chloride SA  20 mEq Oral Daily  . trandolapril  4 mg Oral Daily  . triamterene-hydrochlorothiazide  1 tablet Oral Daily    OBJECTIVE  Filed Vitals:   11/28/13 1900 11/28/13 2009 11/29/13 0415 11/29/13 1019  BP:  127/72 144/84 127/62  Pulse:  70 70 75  Temp:  97.9 F (36.6 C) 97.7 F (36.5 C)   TempSrc:  Oral Oral   Resp:  21 20   Height: 5\' 7"  (1.702 m)     Weight:      SpO2:  96% 97%     Intake/Output Summary (Last 24 hours) at 11/29/13 1027 Last data filed at 11/29/13 1006  Gross per 24 hour  Intake   1360 ml  Output      5 ml  Net   1355 ml   Filed Weights   11/25/13 1014 11/26/13 0013 11/27/13 0005  Weight: 269 lb (122.018 kg) 273 lb 5.9 oz (124 kg) 271 lb 2.7 oz (123 kg)    PHYSICAL EXAM  General: Pleasant, NAD. Neuro: Alert and oriented X 3. Moves all extremities spontaneously. Psych: Normal affect. HEENT:  Normal  Neck: Supple without bruits or JVD. Lungs:  Resp regular and unlabored,  CTA. Heart: RRR no s3, s4,  1/6 AS murmur. Abdomen: Soft, non-tender, non-distended, BS + x 4.  Extremities: No clubbing, cyanosis or edema. DP/PT/Radials 2+ and equal bilaterally.  Accessory Clinical Findings  CBC  Recent Labs  11/28/13 0500 11/29/13 0329  WBC 5.4 6.5  HGB 13.0 13.0  HCT 37.8* 38.1*  MCV 85.7 86.4  PLT 130* 124*   Basic Metabolic Panel  Recent Labs  11/28/13 0500 11/29/13 0329  NA 137 141  K 3.6* 4.1  CL 98 102  CO2 24 24  GLUCOSE 140* 120*  BUN 21 22  CREATININE 1.20 1.19  CALCIUM 9.3 9.0   Liver Function Tests No results found for this basename: AST, ALT, ALKPHOS, BILITOT, PROT, ALBUMIN,  in the last 72 hours No results found for this basename: LIPASE, AMYLASE,  in the last 72 hours Cardiac Enzymes No results found for this basename: CKTOTAL, CKMB, CKMBINDEX, TROPONINI,  in the last 72 hours BNP No components found with this basename: POCBNP,  D-Dimer No results found for this basename: DDIMER,  in the last 72 hours Hemoglobin A1C  Recent Labs  11/26/13 1054  HGBA1C 6.5*   Fasting Lipid Panel No results found for this basename: CHOL, HDL, LDLCALC, TRIG, CHOLHDL, LDLDIRECT,  in the last 72 hours Thyroid Function Tests  Recent Labs  11/27/13 0338  TSH 1.350    TELE  NSR  Radiology/Studies  Dg Chest 2 View  11/27/2013   CLINICAL DATA:  Coronary artery disease, angina  EXAM: CHEST  2 VIEW  COMPARISON:  February 01, 2011  FINDINGS: The heart size and mediastinal contours are stable. Dual lead cardiac pacemaker is unchanged. The mild atelectasis of the medial right lung base. There is no focal pneumonia, pulmonary edema or pleural effusion. The visualized skeletal structures are stable.  IMPRESSION: Mild atelectasis of right lung base.   Electronically Signed   By: Sherian ReinWei-Chen  Lin M.D.   On: 11/27/2013 07:57    ASSESSMENT AND PLAN 1.3 vessel CAD 2. Obesity 3. HTN 4. Mild AS Rec: note plans for CABG on Monday.   Sajid Ruppert,M.D.  11/29/2013 10:27 AM

## 2013-11-29 NOTE — Progress Notes (Signed)
ANTICOAGULATION CONSULT NOTE - Follow Up Consult  Pharmacy Consult for Heparin Indication: atrial fibrillation, 3v CAD  Allergies  Allergen Reactions  . Acetaminophen-Codeine     Extreme constipation   . Amoxicillin Other (See Comments)    Unknown-maybe a rash?  . Diovan [Valsartan] Other (See Comments)    Unknown     Patient Measurements: Height: 5\' 7"  (170.2 cm) Weight: 271 lb 2.7 oz (123 kg) IBW/kg (Calculated) : 66.1 Heparin Dosing Weight: 95 kg  Vital Signs: Temp: 97.7 F (36.5 C) (07/04 0415) Temp src: Oral (07/04 0415) BP: 127/62 mmHg (07/04 1019) Pulse Rate: 75 (07/04 1019)  Labs:  Recent Labs  11/27/13 0338 11/28/13 0500 11/29/13 0329  HGB 13.4 13.0 13.0  HCT 39.3 37.8* 38.1*  PLT 125* 130* 124*  APTT  --   --  61*  HEPARINUNFRC 0.34 0.39 0.35  CREATININE 1.12 1.20 1.19    Estimated Creatinine Clearance: 70.6 ml/min (by C-G formula based on Cr of 1.19).  Medications: Heparin @ 1400 units/hr  Assessment: Brandon Klein continues on heparin awaiting CABG on Monday. Heparin level is therapeutic. CBC is stable. No bleeding reported.  Goal of Therapy:  Heparin level 0.3-0.7 units/ml Monitor platelets by anticoagulation protocol: Yes   Plan:  1) Continue heparin at 1400 units/hr 2) Follow up heparin level and CBC in AM  [facility], [place].D., BCPS 11/29/2013 1:25 PM

## 2013-11-30 DIAGNOSIS — D696 Thrombocytopenia, unspecified: Secondary | ICD-10-CM | POA: Diagnosis not present

## 2013-11-30 DIAGNOSIS — T82897A Other specified complication of cardiac prosthetic devices, implants and grafts, initial encounter: Secondary | ICD-10-CM | POA: Diagnosis not present

## 2013-11-30 DIAGNOSIS — I2510 Atherosclerotic heart disease of native coronary artery without angina pectoris: Secondary | ICD-10-CM | POA: Diagnosis not present

## 2013-11-30 DIAGNOSIS — Z6841 Body Mass Index (BMI) 40.0 and over, adult: Secondary | ICD-10-CM | POA: Diagnosis not present

## 2013-11-30 LAB — BASIC METABOLIC PANEL
Anion gap: 15 (ref 5–15)
Anion gap: 14 (ref 5–15)
BUN: 24 mg/dL — ABNORMAL HIGH (ref 6–23)
BUN: 23 mg/dL (ref 6–23)
CO2: 23 mEq/L (ref 19–32)
CO2: 23 mEq/L (ref 19–32)
Calcium: 9.3 mg/dL (ref 8.4–10.5)
Calcium: 9.2 mg/dL (ref 8.4–10.5)
Chloride: 101 mEq/L (ref 96–112)
Chloride: 100 mEq/L (ref 96–112)
Creatinine, Ser: 1.25 mg/dL (ref 0.50–1.35)
Creatinine, Ser: 1.15 mg/dL (ref 0.50–1.35)
GFR calc Af Amer: 65 mL/min — ABNORMAL LOW (ref >90)
GFR calc Af Amer: 72 mL/min — ABNORMAL LOW (ref >90)
GFR calc non Af Amer: 56 mL/min — ABNORMAL LOW (ref >90)
GFR calc non Af Amer: 62 mL/min — ABNORMAL LOW (ref >90)
Glucose, Bld: 133 mg/dL — ABNORMAL HIGH (ref 70–99)
Glucose, Bld: 133 mg/dL — ABNORMAL HIGH (ref 70–99)
Potassium: 3.8 mEq/L (ref 3.7–5.3)
Potassium: 3.8 mEq/L (ref 3.7–5.3)
Sodium: 139 mEq/L (ref 137–147)
Sodium: 137 mEq/L (ref 137–147)

## 2013-11-30 LAB — CBC
HCT: 37.8 % — ABNORMAL LOW (ref 39.0–52.0)
HCT: 37.5 % — ABNORMAL LOW (ref 39.0–52.0)
Hemoglobin: 12.8 g/dL — ABNORMAL LOW (ref 13.0–17.0)
Hemoglobin: 12.9 g/dL — ABNORMAL LOW (ref 13.0–17.0)
MCH: 29.2 pg (ref 26.0–34.0)
MCH: 29.5 pg (ref 26.0–34.0)
MCHC: 33.9 g/dL (ref 30.0–36.0)
MCHC: 34.4 g/dL (ref 30.0–36.0)
MCV: 86.3 fL (ref 78.0–100.0)
MCV: 85.8 fL (ref 78.0–100.0)
Platelets: 123 10*3/uL — ABNORMAL LOW (ref 150–400)
Platelets: 120 10*3/uL — ABNORMAL LOW (ref 150–400)
RBC: 4.38 MIL/uL (ref 4.22–5.81)
RBC: 4.37 MIL/uL (ref 4.22–5.81)
RDW: 13.9 % (ref 11.5–15.5)
RDW: 13.8 % (ref 11.5–15.5)
WBC: 5.3 10*3/uL (ref 4.0–10.5)
WBC: 4.7 10*3/uL (ref 4.0–10.5)

## 2013-11-30 LAB — PREPARE RBC (CROSSMATCH)

## 2013-11-30 LAB — HEPARIN LEVEL (UNFRACTIONATED): Heparin Unfractionated: 0.4 IU/mL (ref 0.30–0.70)

## 2013-11-30 MED ORDER — PHENYLEPHRINE HCL 10 MG/ML IJ SOLN
30.0000 ug/min | INTRAVENOUS | Status: DC
  Administered 2013-12-01: 26 ug/min via INTRAVENOUS
  Filled 2013-11-30: qty 2

## 2013-11-30 MED ORDER — SODIUM CHLORIDE 0.9 % IV SOLN
INTRAVENOUS | Status: DC
  Administered 2013-12-01: 3.5 [IU]/h via INTRAVENOUS
  Filled 2013-11-30: qty 1

## 2013-11-30 MED ORDER — SODIUM CHLORIDE 0.9 % IV SOLN
INTRAVENOUS | Status: DC
  Filled 2013-11-30: qty 30

## 2013-11-30 MED ORDER — DEXTROSE 5 % IV SOLN
750.0000 mg | INTRAVENOUS | Status: DC
  Filled 2013-11-30: qty 750

## 2013-11-30 MED ORDER — CHLORHEXIDINE GLUCONATE 4 % EX LIQD
60.0000 mL | Freq: Once | CUTANEOUS | Status: AC
  Administered 2013-12-01: 4 via TOPICAL
  Filled 2013-11-30: qty 60

## 2013-11-30 MED ORDER — VANCOMYCIN HCL 10 G IV SOLR
1500.0000 mg | INTRAVENOUS | Status: AC
  Administered 2013-12-01: 1500 mg via INTRAVENOUS
  Filled 2013-11-30: qty 1500

## 2013-11-30 MED ORDER — BISACODYL 5 MG PO TBEC
5.0000 mg | DELAYED_RELEASE_TABLET | Freq: Once | ORAL | Status: AC
  Administered 2013-11-30: 5 mg via ORAL
  Filled 2013-11-30: qty 1

## 2013-11-30 MED ORDER — PLASMA-LYTE 148 IV SOLN
INTRAVENOUS | Status: DC
  Filled 2013-11-30: qty 2.5

## 2013-11-30 MED ORDER — POTASSIUM CHLORIDE 2 MEQ/ML IV SOLN
80.0000 meq | INTRAVENOUS | Status: DC
  Filled 2013-11-30: qty 40

## 2013-11-30 MED ORDER — SODIUM CHLORIDE 0.9 % IV SOLN
INTRAVENOUS | Status: DC
  Administered 2013-12-01: 69 mL/h via INTRAVENOUS
  Filled 2013-11-30: qty 40

## 2013-11-30 MED ORDER — DEXMEDETOMIDINE HCL IN NACL 400 MCG/100ML IV SOLN
0.1000 ug/kg/h | INTRAVENOUS | Status: DC
  Administered 2013-12-01: 0.4 ug/kg/h via INTRAVENOUS
  Administered 2013-12-01: 0.3 ug/kg/h via INTRAVENOUS
  Filled 2013-11-30: qty 100

## 2013-11-30 MED ORDER — VANCOMYCIN HCL 1000 MG IV SOLR
INTRAVENOUS | Status: DC
  Filled 2013-11-30: qty 1000

## 2013-11-30 MED ORDER — DEXTROSE 5 % IV SOLN
1.5000 g | INTRAVENOUS | Status: DC
  Administered 2013-12-01: .75 g via INTRAVENOUS
  Administered 2013-12-01: 1.5 g via INTRAVENOUS
  Filled 2013-11-30 (×2): qty 1.5

## 2013-11-30 MED ORDER — EPINEPHRINE HCL 1 MG/ML IJ SOLN
0.5000 ug/min | INTRAVENOUS | Status: DC
  Filled 2013-11-30: qty 4

## 2013-11-30 MED ORDER — MAGNESIUM SULFATE 50 % IJ SOLN
40.0000 meq | INTRAMUSCULAR | Status: DC
  Filled 2013-11-30: qty 10

## 2013-11-30 MED ORDER — TEMAZEPAM 15 MG PO CAPS: 15.0000 mg | ORAL_CAPSULE | Freq: Once | ORAL | Status: AC | PRN

## 2013-11-30 MED ORDER — DOPAMINE-DEXTROSE 3.2-5 MG/ML-% IV SOLN
2.0000 ug/kg/min | INTRAVENOUS | Status: DC
  Administered 2013-12-01: 3 ug/kg/min via INTRAVENOUS
  Filled 2013-11-30: qty 250

## 2013-11-30 MED ORDER — CHLORHEXIDINE GLUCONATE 4 % EX LIQD
60.0000 mL | Freq: Once | CUTANEOUS | Status: AC
  Administered 2013-11-30: 4 via TOPICAL
  Filled 2013-11-30: qty 60

## 2013-11-30 MED ORDER — METOPROLOL TARTRATE 12.5 MG HALF TABLET
12.5000 mg | ORAL_TABLET | Freq: Once | ORAL | Status: AC
  Administered 2013-12-01: 12.5 mg via ORAL
  Filled 2013-11-30: qty 1

## 2013-11-30 MED ORDER — NITROGLYCERIN IN D5W 200-5 MCG/ML-% IV SOLN
2.0000 ug/min | INTRAVENOUS | Status: DC
  Filled 2013-11-30: qty 250

## 2013-11-30 NOTE — Progress Notes (Signed)
ANTICOAGULATION CONSULT NOTE - Follow Up Consult  Pharmacy Consult for Heparin Indication: atrial fibrillation, 3v CAD  Allergies  Allergen Reactions  . Acetaminophen-Codeine     Extreme constipation   . Amoxicillin Other (See Comments)    Unknown-maybe a rash?  . Diovan [Valsartan] Other (See Comments)    Unknown     Patient Measurements: Height: 5\' 7"  (170.2 cm) Weight: 271 lb 2.7 oz (123 kg) IBW/kg (Calculated) : 66.1 Heparin Dosing Weight: 95 kg  Vital Signs: Temp: 97.8 F (36.6 C) (07/05 0554) Temp src: Oral (07/05 0554) BP: 150/72 mmHg (07/05 0910) Pulse Rate: 72 (07/05 0910)  Labs:  Recent Labs  11/28/13 0500 11/29/13 0329 11/30/13 0330 11/30/13 0640  HGB 13.0 13.0 12.8* 12.9*  HCT 37.8* 38.1* 37.8* 37.5*  PLT 130* 124* 123* 120*  APTT  --  61*  --   --   HEPARINUNFRC 0.39 0.35 0.40  --   CREATININE 1.20 1.19 1.25 1.15    Estimated Creatinine Clearance: 73 ml/min (by C-G formula based on Cr of 1.15).  Medications: Heparin @ 1400 units/hr  Assessment: 72yom continues on heparin awaiting CABG tomorrow. Heparin level is therapeutic. CBC is stable. No bleeding reported.  Goal of Therapy:  Heparin level 0.3-0.7 units/ml Monitor platelets by anticoagulation protocol: Yes   Plan:  1) Continue heparin at 1400 units/hr 2) Follow up heparin level and CBC in AM 3) Follow up after CABG  Louie Casa, Pharm.D., BCPS 11/30/2013 12:47 PM

## 2013-11-30 NOTE — Progress Notes (Signed)
[address]       [place] [id number]             [phone]     CARDIOTHORACIC SURGERY PROGRESS NOTE  5 Days Post-Op  S/P Procedure(s) (LRB): LEFT HEART CATHETERIZATION WITH CORONARY ANGIOGRAM (N/A)  Subjective: No chest pain  Objective: Vital signs in last 24 hours: Temp:  [97.8 Klein (36.6 C)-98.1 Klein (36.7 C)] 97.8 Klein (36.6 C) (07/05 0554) Pulse Rate:  [65-72] 72 (07/05 0910) Cardiac Rhythm:  [-] Normal sinus rhythm (07/05 0730) Resp:  [16-20] 16 (07/05 0554) BP: (104-150)/(66-80) 150/72 mmHg (07/05 0910) SpO2:  [95 %-98 %] 96 % (07/05 0554)  Physical Exam:  Rhythm:   sinus  Breath sounds: clear  Heart sounds:  RRR  Incisions:  N/A  Abdomen:  soft  Extremities:  warm   Intake/Output from previous day: 07/04 0701 - 07/05 0700 In: 1008 [P.O.:840; I.V.:168] Out: -  Intake/Output this shift:    Lab Results:  Recent Labs  11/30/13 0330 11/30/13 0640  WBC 5.3 4.7  HGB 12.8* 12.9*  HCT 37.8* 37.5*  PLT 123* 120*   BMET:  Recent Labs  11/30/13 0330 11/30/13 0640  NA 139 137  K 3.8 3.8  CL 101 100  CO2 23 23  GLUCOSE 133* 133*  BUN 24* 23  CREATININE 1.25 1.15  CALCIUM 9.3 9.2    CBG (last 3)  No results found for this basename: GLUCAP,  in the last 72 hours PT/INR:  No results found for this basename: LABPROT, INR,  in the last 72 hours  CXR:  CHEST 2 VIEW  COMPARISON: February 01, 2011  FINDINGS:  The heart size and mediastinal contours are stable. Dual lead  cardiac pacemaker is unchanged. The mild atelectasis of the medial  right lung base. There is no focal pneumonia, pulmonary edema or  pleural effusion. The visualized skeletal structures are stable.  IMPRESSION:  Mild atelectasis of right lung base.  Electronically Signed  By: Sherian ReinWei-Chen Lin M.D.  On: 11/27/2013 07:57   Carotid Duplex: Noninvasive Vascular Lab  Preoperative Vascular Evaluation  Patient: Brandon Klein, Brandon Klein MR #: [id number] Study Date:  11/29/2013 Gender: M Age: 73 Height: Weight: BSA: Pt. Status: Room: 2W30C  SONOGRAPHER Sherren Kernsandace Kanady, RVS ORDERING VanTrigt, Arn MedalPeter REFERRING VanTrigt, Peter ADMITTING Lesleigh NoeSmith Iii, Henry W ATTENDING Lesleigh NoeSmith Iii, Henry W  Reports also to:  ------------------------------------------------------------------- History and indications:  Indications  V72.81 Screening Pre-operative. 414.01 CAD. History  Diagnostic evaluation.  ------------------------------------------------------------------- Study information:  Study status: Routine. Preoperative vascular evaluation for heart surgery. Carotid duplex exam per standard extablished protocols, limited upper extremity arterial evaluation and ABIs as indicated. Birthdate: Patient birthdate: 02-26-1941. Age: Patient is 73 yr old. Sex: Gender: male. Study date: Study date: 11/29/2013. Study time: 10:43 AM. Location: Bedside. Patient status: Inpatient.  Brachial pressures:  +--------+-----+----+---+  RightLeftMax +--------+-----+----+---+ Systolic117 129 129 +--------+-----+----+---+  Arterial pressure indices:  +-----------------+---------+--------------+--------+ Location Pressure Brachial indexWaveform +-----------------+---------+--------------+--------+ Right ant tibial 130 mm Hg1.01 Normal  +-----------------+---------+--------------+--------+ Right post tibial127 mm Hg0.98 Normal  +-----------------+---------+--------------+--------+ Left ant tibial 128 mm Hg0.99 Normal  +-----------------+---------+--------------+--------+ Left post tibial 154 mm Hg1.19 Normal  +-----------------+---------+--------------+--------+  Arterial flow:  +---------------+---------+-------+----------------+--------------+ Location V sys V ed Flow analysis Comment  +---------------+---------+-------+----------------+--------------+ Left CCA - 76 cm/s 17  cm/s------------------------------ distal      +---------------+---------+-------+----------------+--------------+ Left CCA - 99 cm/s 18 cm/s------------------------------ proximal      +---------------+---------+-------+----------------+--------------+ Left ICA - -82 cm/s -28 ------------------------------ distal  cm/s    +---------------+---------+-------+----------------+--------------+  Left ICA - -72 cm/s -21 ------------------------------ proximal  cm/s    +---------------+---------+-------+----------------+--------------+ Left ECA -96 cm/s -13 ------------------------------   cm/s    +---------------+---------+-------+----------------+--------------+ Left vertebral 45 cm/s 11 cm/s------------------------------ +---------------+---------+-------+----------------+--------------+ Right CCA - 104 cm/s 17 cm/s------------------------------ distal      +---------------+---------+-------+----------------+--------------+ Right CCA - 105 cm/s 16 cm/s------------------------------ proximal      +---------------+---------+-------+----------------+--------------+ Right ICA - -78 cm/s -28 ------------------------------ distal  cm/s    +---------------+---------+-------+----------------+--------------+ Right ICA - -83 cm/s -24 ------------------------------ proximal  cm/s    +---------------+---------+-------+----------------+--------------+ Right ECA -113 cm/s-12 ------------------------------   cm/s    +---------------+---------+-------+----------------+--------------+ Right vertebral18 cm/s 4 cm/s ------------------------------ +---------------+---------+-------+----------------+--------------+ Right brachial 117 cm/s -------Triphasic --------------    waveform   +---------------+---------+-------+----------------+--------------+ Right radial  ----------------Triphasic --------------    waveform   +---------------+---------+-------+----------------+--------------+ Right ulnar ----------------Triphasic --------------    waveform   +---------------+---------+-------+----------------+--------------+ Right palmar --------------------------------WNL.  arch      +---------------+---------+-------+----------------+--------------+ Left brachial 124 cm/s -------Triphasic --------------    waveform   +---------------+---------+-------+----------------+--------------+ Left radial ----------------Triphasic --------------    waveform   +---------------+---------+-------+----------------+--------------+ Left ulnar ----------------Triphasic --------------    waveform   +---------------+---------+-------+----------------+--------------+ Left palmar --------------------------------Doppler  arch    remains normal     with radial      compression      and      obliterates      with ulnar      compression.  +---------------+---------+-------+----------------+--------------+  ------------------------------------------------------------------- Summary: RIght: mld calcific plaque origin ICA. Left: intimal wall thickening CCA. Bilateral: 1-39% ICA stenosis. Vertebral artery flow is antegrade. ICA/CCA ratio: R-0.80 L-1.08. Prepared and Electronically Authenticated by  Leonides Sake, MD 2015-07-05T09:05:38    Assessment/Plan: S/P Procedure(s) (LRB): LEFT HEART CATHETERIZATION WITH CORONARY ANGIOGRAM (N/A)  For CABG tomorrow  Tanessa Tidd H 11/30/2013 11:12 AM

## 2013-11-30 NOTE — Progress Notes (Signed)
Patient ID: Brandon Klein, male   DOB: 1940/08/26, 73 y.o.   MRN: [id number]      Subjective:   No complaints this morning   Objective:   Temp:  [97.8 F (36.6 C)-98.1 F (36.7 C)] 97.8 F (36.6 C) (07/05 0554) Pulse Rate:  [65-75] 72 (07/05 0910) Resp:  [16-20] 16 (07/05 0554) BP: (104-150)/(62-80) 150/72 mmHg (07/05 0910) SpO2:  [95 %-98 %] 96 % (07/05 0554) Last BM Date: 11/29/13  Filed Weights   11/25/13 1014 11/26/13 0013 11/27/13 0005  Weight: 269 lb (122.018 kg) 273 lb 5.9 oz (124 kg) 271 lb 2.7 oz (123 kg)    Intake/Output Summary (Last 24 hours) at 11/30/13 [id number] Last data filed at 11/30/13 0700  Gross per 24 hour  Intake   1008 ml  Output      0 ml  Net   1008 ml    Telemetry: NSR  Exam:  General: NAD  Resp: CTAB  Cardiac: RRR, no m/r/g, no JVD  FA:OZHYQMVGI:abdomen soft, NT, ND  MSK: no LE edema  Neuro: no focal deficits  Psych: appropriate affect  Lab Results:  Basic Metabolic Panel:  Recent Labs Lab 11/29/13 0329 11/30/13 0330 11/30/13 0640  NA 141 139 137  K 4.1 3.8 3.8  CL 102 101 100  CO2 24 23 23   GLUCOSE 120* 133* 133*  BUN 22 24* 23  CREATININE 1.19 1.25 1.15  CALCIUM 9.0 9.3 9.2    Liver Function Tests: No results found for this basename: AST, ALT, ALKPHOS, BILITOT, PROT, ALBUMIN,  in the last 168 hours  CBC:  Recent Labs Lab 11/29/13 0329 11/30/13 0330 11/30/13 0640  WBC 6.5 5.3 4.7  HGB 13.0 12.8* 12.9*  HCT 38.1* 37.8* 37.5*  MCV 86.4 86.3 85.8  PLT 124* 123* 120*    Cardiac Enzymes: No results found for this basename: CKTOTAL, CKMB, CKMBINDEX, TROPONINI,  in the last 168 hours  BNP: No results found for this basename: PROBNP,  in the last 8760 hours  Coagulation:  Recent Labs Lab 11/25/13 1030  INR 1.00    ECG:   Medications:   Scheduled Medications: . amLODipine  10 mg Oral Daily  . aspirin EC  81 mg Oral Daily  . atorvastatin  20 mg Oral q1800  . bisacodyl  5 mg Oral Once  . chlorhexidine  60  mL Topical Once   And  . [START ON 12/01/2013] chlorhexidine  60 mL Topical Once  . Chlorhexidine Gluconate Cloth  6 each Topical Daily  . fluticasone  2 spray Each Nare Daily  . loratadine  10 mg Oral Daily  . metoprolol succinate  50 mg Oral Daily  . [START ON 12/01/2013] metoprolol tartrate  12.5 mg Oral Once  . multivitamin with minerals  1 tablet Oral Daily  . mupirocin ointment  1 application Nasal BID  . pantoprazole  40 mg Oral Daily  . potassium chloride SA  20 mEq Oral Daily  . trandolapril  4 mg Oral Daily  . triamterene-hydrochlorothiazide  1 tablet Oral Daily     Infusions: . 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 CAD  - initially with symptoms of chest pain and SOB. MPI with evidence of ischemia, referred for cath - cath  with severe multivessel disease , CT surgery consulted for possible CABG. Plan for surgery Monday July 7th per there note  - continue medical therapy including heparin        Dina Rich, M.D., F.A.C.C.

## 2013-12-01 ENCOUNTER — Inpatient Hospital Stay (HOSPITAL_COMMUNITY): Payer: Medicare Other | Admitting: Anesthesiology

## 2013-12-01 ENCOUNTER — Encounter (HOSPITAL_COMMUNITY)
Admission: RE | Disposition: A | Discharge status: Home / Self Care | Payer: Medicare Other | Source: Ambulatory Visit | Attending: Cardiothoracic Surgery

## 2013-12-01 ENCOUNTER — Inpatient Hospital Stay (HOSPITAL_COMMUNITY): Payer: Medicare Other

## 2013-12-01 ENCOUNTER — Encounter (HOSPITAL_COMMUNITY): Payer: Medicare Other | Admitting: Anesthesiology

## 2013-12-01 ENCOUNTER — Encounter (HOSPITAL_COMMUNITY): Payer: Self-pay | Admitting: Anesthesiology

## 2013-12-01 DIAGNOSIS — T82897A Other specified complication of cardiac prosthetic devices, implants and grafts, initial encounter: Secondary | ICD-10-CM | POA: Diagnosis not present

## 2013-12-01 DIAGNOSIS — I2510 Atherosclerotic heart disease of native coronary artery without angina pectoris: Secondary | ICD-10-CM

## 2013-12-01 DIAGNOSIS — D696 Thrombocytopenia, unspecified: Secondary | ICD-10-CM | POA: Diagnosis not present

## 2013-12-01 DIAGNOSIS — Z951 Presence of aortocoronary bypass graft: Secondary | ICD-10-CM

## 2013-12-01 DIAGNOSIS — Z6841 Body Mass Index (BMI) 40.0 and over, adult: Secondary | ICD-10-CM | POA: Diagnosis not present

## 2013-12-01 HISTORY — PX: CORONARY ARTERY BYPASS GRAFT: SHX141

## 2013-12-01 HISTORY — DX: Presence of aortocoronary bypass graft: Z95.1

## 2013-12-01 LAB — CREATININE, SERUM
Creatinine, Ser: 1.18 mg/dL (ref 0.50–1.35)
GFR calc Af Amer: 69 mL/min — ABNORMAL LOW (ref >90)
GFR calc non Af Amer: 60 mL/min — ABNORMAL LOW (ref >90)

## 2013-12-01 LAB — POCT I-STAT 4, (NA,K, GLUC, HGB,HCT)
GLUCOSE: 146 mg/dL — AB (ref 70–99)
GLUCOSE: 132 mg/dL — AB (ref 70–99)
GLUCOSE: 148 mg/dL — AB (ref 70–99)
Glucose, Bld: 121 mg/dL — ABNORMAL HIGH (ref 70–99)
Glucose, Bld: 167 mg/dL — ABNORMAL HIGH (ref 70–99)
Glucose, Bld: 127 mg/dL — ABNORMAL HIGH (ref 70–99)
HCT: 37 % — ABNORMAL LOW (ref 39.0–52.0)
HCT: 28 % — ABNORMAL LOW (ref 39.0–52.0)
HEMATOCRIT: 39 % (ref 39.0–52.0)
HEMATOCRIT: 29 % — AB (ref 39.0–52.0)
HEMATOCRIT: 30 % — AB (ref 39.0–52.0)
HEMATOCRIT: 33 % — AB (ref 39.0–52.0)
HEMOGLOBIN: 13.3 g/dL (ref 13.0–17.0)
HEMOGLOBIN: 10.2 g/dL — AB (ref 13.0–17.0)
Hemoglobin: 12.6 g/dL — ABNORMAL LOW (ref 13.0–17.0)
Hemoglobin: 9.9 g/dL — ABNORMAL LOW (ref 13.0–17.0)
Hemoglobin: 9.5 g/dL — ABNORMAL LOW (ref 13.0–17.0)
Hemoglobin: 11.2 g/dL — ABNORMAL LOW (ref 13.0–17.0)
Potassium: 4.6 mEq/L (ref 3.7–5.3)
Potassium: 4.5 mEq/L (ref 3.7–5.3)
Potassium: 4.6 mEq/L (ref 3.7–5.3)
Potassium: 5.1 mEq/L (ref 3.7–5.3)
Potassium: 4.4 mEq/L (ref 3.7–5.3)
Potassium: 4 mEq/L (ref 3.7–5.3)
SODIUM: 137 meq/L (ref 137–147)
SODIUM: 136 meq/L — AB (ref 137–147)
Sodium: 137 mEq/L (ref 137–147)
Sodium: 137 mEq/L (ref 137–147)
Sodium: 133 mEq/L — ABNORMAL LOW (ref 137–147)
Sodium: 136 mEq/L — ABNORMAL LOW (ref 137–147)

## 2013-12-01 LAB — CBC
HCT: 40.6 % (ref 39.0–52.0)
HCT: 30.5 % — ABNORMAL LOW (ref 39.0–52.0)
HEMATOCRIT: 34.3 % — AB (ref 39.0–52.0)
HEMOGLOBIN: 11.8 g/dL — AB (ref 13.0–17.0)
Hemoglobin: 13.8 g/dL (ref 13.0–17.0)
Hemoglobin: 10.5 g/dL — ABNORMAL LOW (ref 13.0–17.0)
MCH: 29.9 pg (ref 26.0–34.0)
MCH: 30.1 pg (ref 26.0–34.0)
MCH: 29.8 pg (ref 26.0–34.0)
MCHC: 34 g/dL (ref 30.0–36.0)
MCHC: 34.4 g/dL (ref 30.0–36.0)
MCHC: 34.4 g/dL (ref 30.0–36.0)
MCV: 87.9 fL (ref 78.0–100.0)
MCV: 87.5 fL (ref 78.0–100.0)
MCV: 86.6 fL (ref 78.0–100.0)
Platelets: 136 10*3/uL — ABNORMAL LOW (ref 150–400)
Platelets: 125 10*3/uL — ABNORMAL LOW (ref 150–400)
Platelets: 144 10*3/uL — ABNORMAL LOW (ref 150–400)
RBC: 4.62 MIL/uL (ref 4.22–5.81)
RBC: 3.92 MIL/uL — AB (ref 4.22–5.81)
RBC: 3.52 MIL/uL — ABNORMAL LOW (ref 4.22–5.81)
RDW: 14.1 % (ref 11.5–15.5)
RDW: 14 % (ref 11.5–15.5)
RDW: 14 % (ref 11.5–15.5)
WBC: 5.8 10*3/uL (ref 4.0–10.5)
WBC: 12.5 10*3/uL — ABNORMAL HIGH (ref 4.0–10.5)
WBC: 15.2 10*3/uL — ABNORMAL HIGH (ref 4.0–10.5)

## 2013-12-01 LAB — GLUCOSE, CAPILLARY
GLUCOSE-CAPILLARY: 102 mg/dL — AB (ref 70–99)
GLUCOSE-CAPILLARY: 111 mg/dL — AB (ref 70–99)
GLUCOSE-CAPILLARY: 130 mg/dL — AB (ref 70–99)
Glucose-Capillary: 113 mg/dL — ABNORMAL HIGH (ref 70–99)
Glucose-Capillary: 117 mg/dL — ABNORMAL HIGH (ref 70–99)
Glucose-Capillary: 165 mg/dL — ABNORMAL HIGH (ref 70–99)
Glucose-Capillary: 150 mg/dL — ABNORMAL HIGH (ref 70–99)

## 2013-12-01 LAB — BASIC METABOLIC PANEL
Anion gap: 15 (ref 5–15)
BUN: 21 mg/dL (ref 6–23)
CO2: 23 mEq/L (ref 19–32)
Calcium: 9.6 mg/dL (ref 8.4–10.5)
Chloride: 100 mEq/L (ref 96–112)
Creatinine, Ser: 1.19 mg/dL (ref 0.50–1.35)
GFR calc Af Amer: 69 mL/min — ABNORMAL LOW (ref >90)
GFR calc non Af Amer: 59 mL/min — ABNORMAL LOW (ref >90)
Glucose, Bld: 120 mg/dL — ABNORMAL HIGH (ref 70–99)
Potassium: 4.2 mEq/L (ref 3.7–5.3)
Sodium: 138 mEq/L (ref 137–147)

## 2013-12-01 LAB — POCT I-STAT 3, ART BLOOD GAS (G3+)
ACID-BASE DEFICIT: 2 mmol/L (ref 0.0–2.0)
Acid-base deficit: 1 mmol/L (ref 0.0–2.0)
Acid-base deficit: 2 mmol/L (ref 0.0–2.0)
Acid-base deficit: 3 mmol/L — ABNORMAL HIGH (ref 0.0–2.0)
Acid-base deficit: 4 mmol/L — ABNORMAL HIGH (ref 0.0–2.0)
BICARBONATE: 25.9 meq/L — AB (ref 20.0–24.0)
BICARBONATE: 21.6 meq/L (ref 20.0–24.0)
Bicarbonate: 25.8 mEq/L — ABNORMAL HIGH (ref 20.0–24.0)
Bicarbonate: 23.6 mEq/L (ref 20.0–24.0)
Bicarbonate: 24.7 mEq/L — ABNORMAL HIGH (ref 20.0–24.0)
Bicarbonate: 22.8 mEq/L (ref 20.0–24.0)
O2 SAT: 92 %
O2 Saturation: 100 %
O2 Saturation: 100 %
O2 Saturation: 99 %
O2 Saturation: 94 %
O2 Saturation: 90 %
PCO2 ART: 49.9 mmHg — AB (ref 35.0–45.0)
PCO2 ART: 43.3 mmHg (ref 35.0–45.0)
PH ART: 7.35 (ref 7.350–7.450)
PH ART: 7.328 — AB (ref 7.350–7.450)
PO2 ART: 134 mmHg — AB (ref 80.0–100.0)
PO2 ART: 64 mmHg — AB (ref 80.0–100.0)
Patient temperature: 37.1
TCO2: 27 mmol/L (ref 0–100)
TCO2: 27 mmol/L (ref 0–100)
TCO2: 25 mmol/L (ref 0–100)
TCO2: 26 mmol/L (ref 0–100)
TCO2: 24 mmol/L (ref 0–100)
TCO2: 23 mmol/L (ref 0–100)
pCO2 arterial: 49.6 mmHg — ABNORMAL HIGH (ref 35.0–45.0)
pCO2 arterial: 47 mmHg — ABNORMAL HIGH (ref 35.0–45.0)
pCO2 arterial: 43.2 mmHg (ref 35.0–45.0)
pCO2 arterial: 41.3 mmHg (ref 35.0–45.0)
pH, Arterial: 7.325 — ABNORMAL LOW (ref 7.350–7.450)
pH, Arterial: 7.345 — ABNORMAL LOW (ref 7.350–7.450)
pH, Arterial: 7.302 — ABNORMAL LOW (ref 7.350–7.450)
pH, Arterial: 7.331 — ABNORMAL LOW (ref 7.350–7.450)
pO2, Arterial: 414 mmHg — ABNORMAL HIGH (ref 80.0–100.0)
pO2, Arterial: 415 mmHg — ABNORMAL HIGH (ref 80.0–100.0)
pO2, Arterial: 72 mmHg — ABNORMAL LOW (ref 80.0–100.0)
pO2, Arterial: 76 mmHg — ABNORMAL LOW (ref 80.0–100.0)

## 2013-12-01 LAB — POCT I-STAT 3, VENOUS BLOOD GAS (G3P V)
ACID-BASE DEFICIT: 2 mmol/L (ref 0.0–2.0)
Bicarbonate: 25.3 mEq/L — ABNORMAL HIGH (ref 20.0–24.0)
O2 Saturation: 80 %
TCO2: 27 mmol/L (ref 0–100)
pCO2, Ven: 51.6 mmHg — ABNORMAL HIGH (ref 45.0–50.0)
pH, Ven: 7.298 (ref 7.250–7.300)
pO2, Ven: 49 mmHg — ABNORMAL HIGH (ref 30.0–45.0)

## 2013-12-01 LAB — TYPE AND SCREEN
ABO/RH(D): O POS
Antibody Screen: NEGATIVE
Unit division: 0
Unit division: 0

## 2013-12-01 LAB — PREPARE RBC (CROSSMATCH)

## 2013-12-01 LAB — PLATELET COUNT: Platelets: 161 10*3/uL (ref 150–400)

## 2013-12-01 LAB — POCT I-STAT, CHEM 8
BUN: 20 mg/dL (ref 6–23)
CHLORIDE: 103 meq/L (ref 96–112)
Calcium, Ion: 1.18 mmol/L (ref 1.13–1.30)
Creatinine, Ser: 1.3 mg/dL (ref 0.50–1.35)
Glucose, Bld: 167 mg/dL — ABNORMAL HIGH (ref 70–99)
HEMATOCRIT: 31 % — AB (ref 39.0–52.0)
Hemoglobin: 10.5 g/dL — ABNORMAL LOW (ref 13.0–17.0)
Potassium: 4 mEq/L (ref 3.7–5.3)
SODIUM: 137 meq/L (ref 137–147)
TCO2: 21 mmol/L (ref 0–100)

## 2013-12-01 LAB — PROTIME-INR
INR: 1.22 (ref 0.00–1.49)
Prothrombin Time: 15.4 seconds — ABNORMAL HIGH (ref 11.6–15.2)

## 2013-12-01 LAB — HEMOGLOBIN AND HEMATOCRIT, BLOOD
HCT: 29.3 % — ABNORMAL LOW (ref 39.0–52.0)
Hemoglobin: 10.3 g/dL — ABNORMAL LOW (ref 13.0–17.0)

## 2013-12-01 LAB — FIBRINOGEN: Fibrinogen: 258 mg/dL (ref 204–475)

## 2013-12-01 LAB — HEPARIN LEVEL (UNFRACTIONATED): Heparin Unfractionated: 0.33 IU/mL (ref 0.30–0.70)

## 2013-12-01 LAB — APTT: aPTT: 29 seconds (ref 24–37)

## 2013-12-01 LAB — MAGNESIUM: Magnesium: 2.5 mg/dL (ref 1.5–2.5)

## 2013-12-01 SURGERY — CORONARY ARTERY BYPASS GRAFTING (CABG)
Anesthesia: General | Site: Chest

## 2013-12-01 MED ORDER — ACETAMINOPHEN 160 MG/5ML PO SOLN: 650.0000 mg | Freq: Once | ORAL | Status: AC

## 2013-12-01 MED ORDER — ROCURONIUM BROMIDE 100 MG/10ML IV SOLN
INTRAVENOUS | Status: DC | PRN
  Administered 2013-12-01 (×4): 50 mg via INTRAVENOUS
  Administered 2013-12-01: 30 mg via INTRAVENOUS

## 2013-12-01 MED ORDER — SODIUM CHLORIDE 0.9 % IV SOLN
INTRAVENOUS | Status: DC | PRN
  Administered 2013-12-01 (×2): via INTRAVENOUS

## 2013-12-01 MED ORDER — ALBUMIN HUMAN 5 % IV SOLN
INTRAVENOUS | Status: DC | PRN
  Administered 2013-12-01: 12:00:00 via INTRAVENOUS

## 2013-12-01 MED ORDER — ROCURONIUM BROMIDE 50 MG/5ML IV SOLN
INTRAVENOUS | Status: AC
  Filled 2013-12-01: qty 3

## 2013-12-01 MED ORDER — DOCUSATE SODIUM 100 MG PO CAPS
200.0000 mg | ORAL_CAPSULE | Freq: Every day | ORAL | Status: DC
  Administered 2013-12-02 – 2013-12-06 (×5): 200 mg via ORAL
  Filled 2013-12-01 (×6): qty 2

## 2013-12-01 MED ORDER — VANCOMYCIN HCL IN DEXTROSE 1-5 GM/200ML-% IV SOLN
1000.0000 mg | Freq: Once | INTRAVENOUS | Status: DC
  Filled 2013-12-01: qty 200

## 2013-12-01 MED ORDER — PROPOFOL 10 MG/ML IV BOLUS
INTRAVENOUS | Status: AC
  Filled 2013-12-01: qty 20

## 2013-12-01 MED ORDER — ACETAMINOPHEN 500 MG PO TABS
1000.0000 mg | ORAL_TABLET | Freq: Four times a day (QID) | ORAL | Status: AC
  Administered 2013-12-02 – 2013-12-06 (×15): 1000 mg via ORAL
  Filled 2013-12-01 (×18): qty 2

## 2013-12-01 MED ORDER — VANCOMYCIN HCL IN DEXTROSE 1-5 GM/200ML-% IV SOLN
1000.0000 mg | Freq: Two times a day (BID) | INTRAVENOUS | Status: AC
  Administered 2013-12-01 – 2013-12-02 (×2): 1000 mg via INTRAVENOUS
  Filled 2013-12-01 (×3): qty 200

## 2013-12-01 MED ORDER — METOPROLOL TARTRATE 1 MG/ML IV SOLN
2.5000 mg | INTRAVENOUS | Status: DC | PRN
  Administered 2013-12-02: 5 mg via INTRAVENOUS

## 2013-12-01 MED ORDER — SODIUM CHLORIDE 0.9 % IV SOLN: INTRAVENOUS | Status: DC

## 2013-12-01 MED ORDER — CHLORHEXIDINE GLUCONATE 0.12 % MT SOLN: 15.0000 mL | Freq: Two times a day (BID) | OROMUCOSAL | Status: DC

## 2013-12-01 MED ORDER — FENTANYL CITRATE 0.05 MG/ML IJ SOLN
INTRAMUSCULAR | Status: AC
  Filled 2013-12-01: qty 5

## 2013-12-01 MED ORDER — HEMOSTATIC AGENTS (NO CHARGE) OPTIME
TOPICAL | Status: DC | PRN
  Administered 2013-12-01 (×2): 1 via TOPICAL

## 2013-12-01 MED ORDER — INSULIN REGULAR HUMAN 100 UNIT/ML IJ SOLN
INTRAMUSCULAR | Status: DC
  Administered 2013-12-01: 2.9 [IU]/h via INTRAVENOUS
  Filled 2013-12-01 (×2): qty 1

## 2013-12-01 MED ORDER — LACTATED RINGERS IV SOLN: 500.0000 mL | Freq: Once | INTRAVENOUS | Status: DC | PRN

## 2013-12-01 MED ORDER — METOCLOPRAMIDE HCL 5 MG/ML IJ SOLN
10.0000 mg | Freq: Four times a day (QID) | INTRAMUSCULAR | Status: AC
  Administered 2013-12-01 – 2013-12-02 (×4): 10 mg via INTRAVENOUS
  Filled 2013-12-01 (×4): qty 2

## 2013-12-01 MED ORDER — DEXTROSE 5 % IV SOLN
1.5000 g | Freq: Two times a day (BID) | INTRAVENOUS | Status: AC
  Administered 2013-12-01 – 2013-12-03 (×4): 1.5 g via INTRAVENOUS
  Filled 2013-12-01 (×4): qty 1.5

## 2013-12-01 MED ORDER — ALBUMIN HUMAN 5 % IV SOLN
250.0000 mL | INTRAVENOUS | Status: AC | PRN
  Administered 2013-12-01 (×4): 250 mL via INTRAVENOUS
  Filled 2013-12-01: qty 250

## 2013-12-01 MED ORDER — MIDAZOLAM HCL 10 MG/2ML IJ SOLN
INTRAMUSCULAR | Status: AC
  Filled 2013-12-01: qty 2

## 2013-12-01 MED ORDER — MORPHINE SULFATE 2 MG/ML IJ SOLN
2.0000 mg | INTRAMUSCULAR | Status: DC | PRN
  Administered 2013-12-01 – 2013-12-02 (×2): 2 mg via INTRAVENOUS
  Administered 2013-12-02: 4 mg via INTRAVENOUS
  Administered 2013-12-02: 2 mg via INTRAVENOUS
  Administered 2013-12-02: 4 mg via INTRAVENOUS
  Administered 2013-12-02: 2 mg via INTRAVENOUS
  Filled 2013-12-01: qty 2
  Filled 2013-12-01 (×2): qty 1
  Filled 2013-12-01 (×2): qty 2
  Filled 2013-12-01 (×2): qty 1
  Filled 2013-12-01: qty 2

## 2013-12-01 MED ORDER — SODIUM CHLORIDE 0.9 % IJ SOLN
3.0000 mL | Freq: Two times a day (BID) | INTRAMUSCULAR | Status: DC
  Administered 2013-12-02: 3 mL via INTRAVENOUS

## 2013-12-01 MED ORDER — MIDAZOLAM HCL 5 MG/ML IJ SOLN
INTRAMUSCULAR | Status: DC | PRN
  Administered 2013-12-01: 2 mg via INTRAVENOUS
  Administered 2013-12-01: 8 mg via INTRAVENOUS

## 2013-12-01 MED ORDER — NITROGLYCERIN IN D5W 200-5 MCG/ML-% IV SOLN: 0.0000 ug/min | INTRAVENOUS | Status: DC

## 2013-12-01 MED ORDER — PAPAVERINE HCL 30 MG/ML IJ SOLN
INTRAMUSCULAR | Status: AC
  Administered 2013-12-01: 09:00:00

## 2013-12-01 MED ORDER — LACTATED RINGERS IV SOLN: INTRAVENOUS | Status: DC

## 2013-12-01 MED ORDER — LACTATED RINGERS IV SOLN
INTRAVENOUS | Status: DC | PRN
  Administered 2013-12-01 (×4): via INTRAVENOUS

## 2013-12-01 MED ORDER — OXYCODONE HCL 5 MG PO TABS
5.0000 mg | ORAL_TABLET | ORAL | Status: DC | PRN
  Administered 2013-12-02 – 2013-12-05 (×16): 10 mg via ORAL
  Filled 2013-12-01 (×16): qty 2

## 2013-12-01 MED ORDER — INSULIN REGULAR BOLUS VIA INFUSION
0.0000 [IU] | Freq: Three times a day (TID) | INTRAVENOUS | Status: DC
  Administered 2013-12-02: 5 [IU] via INTRAVENOUS
  Filled 2013-12-01: qty 10

## 2013-12-01 MED ORDER — METOPROLOL TARTRATE 25 MG/10 ML ORAL SUSPENSION
12.5000 mg | Freq: Two times a day (BID) | ORAL | Status: DC
  Filled 2013-12-01 (×7): qty 5

## 2013-12-01 MED ORDER — ONDANSETRON HCL 4 MG/2ML IJ SOLN
4.0000 mg | Freq: Four times a day (QID) | INTRAMUSCULAR | Status: DC | PRN
  Administered 2013-12-05: 4 mg via INTRAVENOUS
  Filled 2013-12-01: qty 2

## 2013-12-01 MED ORDER — NITROGLYCERIN IN D5W 200-5 MCG/ML-% IV SOLN
INTRAVENOUS | Status: DC | PRN
  Administered 2013-12-01: 5 ug/min via INTRAVENOUS

## 2013-12-01 MED ORDER — PROTAMINE SULFATE 10 MG/ML IV SOLN
INTRAVENOUS | Status: DC | PRN
  Administered 2013-12-01: 80 mg via INTRAVENOUS
  Administered 2013-12-01: 100 mg via INTRAVENOUS
  Administered 2013-12-01: 10 mg via INTRAVENOUS
  Administered 2013-12-01: 100 mg via INTRAVENOUS

## 2013-12-01 MED ORDER — PROTAMINE SULFATE 10 MG/ML IV SOLN
INTRAVENOUS | Status: AC
  Filled 2013-12-01: qty 5

## 2013-12-01 MED ORDER — ASPIRIN EC 325 MG PO TBEC
325.0000 mg | DELAYED_RELEASE_TABLET | Freq: Every day | ORAL | Status: DC
  Administered 2013-12-02: 325 mg via ORAL
  Filled 2013-12-01 (×2): qty 1

## 2013-12-01 MED ORDER — EPHEDRINE SULFATE 50 MG/ML IJ SOLN
INTRAMUSCULAR | Status: AC
  Filled 2013-12-01: qty 1

## 2013-12-01 MED ORDER — WARFARIN SODIUM 2.5 MG PO TABS
2.5000 mg | ORAL_TABLET | Freq: Every day | ORAL | Status: DC
Start: 2013-12-02 — End: 2013-12-03
  Administered 2013-12-02: 2.5 mg via ORAL
  Filled 2013-12-01 (×2): qty 1

## 2013-12-01 MED ORDER — BISACODYL 5 MG PO TBEC
10.0000 mg | DELAYED_RELEASE_TABLET | Freq: Every day | ORAL | Status: DC
  Administered 2013-12-02 – 2013-12-05 (×4): 10 mg via ORAL
  Filled 2013-12-01 (×4): qty 2

## 2013-12-01 MED ORDER — SUCCINYLCHOLINE CHLORIDE 20 MG/ML IJ SOLN
INTRAMUSCULAR | Status: AC
  Filled 2013-12-01: qty 1

## 2013-12-01 MED ORDER — LIDOCAINE HCL (CARDIAC) 20 MG/ML IV SOLN
INTRAVENOUS | Status: AC
  Filled 2013-12-01: qty 5

## 2013-12-01 MED ORDER — MIDAZOLAM HCL 2 MG/2ML IJ SOLN: 2.0000 mg | INTRAMUSCULAR | Status: DC | PRN

## 2013-12-01 MED ORDER — MORPHINE SULFATE 2 MG/ML IJ SOLN
1.0000 mg | INTRAMUSCULAR | Status: AC | PRN
  Administered 2013-12-01: 4 mg via INTRAVENOUS

## 2013-12-01 MED ORDER — HEPARIN SODIUM (PORCINE) 1000 UNIT/ML IJ SOLN
INTRAMUSCULAR | Status: AC
  Filled 2013-12-01: qty 2

## 2013-12-01 MED ORDER — SODIUM CHLORIDE 0.45 % IV SOLN
INTRAVENOUS | Status: DC
  Administered 2013-12-01: 14:00:00 via INTRAVENOUS

## 2013-12-01 MED ORDER — PANTOPRAZOLE SODIUM 40 MG PO TBEC
40.0000 mg | DELAYED_RELEASE_TABLET | Freq: Every day | ORAL | Status: DC
  Administered 2013-12-03 – 2013-12-07 (×5): 40 mg via ORAL
  Filled 2013-12-01 (×5): qty 1

## 2013-12-01 MED ORDER — ROCURONIUM BROMIDE 50 MG/5ML IV SOLN
INTRAVENOUS | Status: AC
  Filled 2013-12-01: qty 2

## 2013-12-01 MED ORDER — METOCLOPRAMIDE HCL 5 MG/ML IJ SOLN: 10.0000 mg | Freq: Four times a day (QID) | INTRAMUSCULAR | Status: DC

## 2013-12-01 MED ORDER — WARFARIN - PHYSICIAN DOSING INPATIENT
Freq: Every day | Status: DC
  Administered 2013-12-04: 18:00:00

## 2013-12-01 MED ORDER — HEPARIN SODIUM (PORCINE) 1000 UNIT/ML IJ SOLN
INTRAMUSCULAR | Status: DC | PRN
  Administered 2013-12-01: 39000 [IU] via INTRAVENOUS
  Administered 2013-12-01: 5000 [IU] via INTRAVENOUS

## 2013-12-01 MED ORDER — MUPIROCIN 2 % EX OINT
1.0000 "application " | TOPICAL_OINTMENT | Freq: Two times a day (BID) | CUTANEOUS | Status: AC
  Administered 2013-12-02 – 2013-12-06 (×10): 1 via NASAL
  Filled 2013-12-01 (×3): qty 22

## 2013-12-01 MED ORDER — FENTANYL CITRATE 0.05 MG/ML IJ SOLN
INTRAMUSCULAR | Status: DC | PRN
  Administered 2013-12-01: 250 ug via INTRAVENOUS
  Administered 2013-12-01: 150 ug via INTRAVENOUS
  Administered 2013-12-01: 750 ug via INTRAVENOUS
  Administered 2013-12-01: 50 ug via INTRAVENOUS
  Administered 2013-12-01: 250 ug via INTRAVENOUS
  Administered 2013-12-01: 150 ug via INTRAVENOUS
  Administered 2013-12-01: 100 ug via INTRAVENOUS
  Administered 2013-12-01: 250 ug via INTRAVENOUS
  Administered 2013-12-01: 50 ug via INTRAVENOUS

## 2013-12-01 MED ORDER — ACETAMINOPHEN 650 MG RE SUPP
650.0000 mg | Freq: Once | RECTAL | Status: AC
  Administered 2013-12-01: 650 mg via RECTAL

## 2013-12-01 MED ORDER — SODIUM CHLORIDE 0.9 % IJ SOLN
INTRAMUSCULAR | Status: AC
  Filled 2013-12-01: qty 10

## 2013-12-01 MED ORDER — ACETAMINOPHEN 160 MG/5ML PO SOLN
1000.0000 mg | Freq: Four times a day (QID) | ORAL | Status: AC
  Filled 2013-12-01: qty 40

## 2013-12-01 MED ORDER — MAGNESIUM SULFATE 4000MG/100ML IJ SOLN
4.0000 g | Freq: Once | INTRAMUSCULAR | Status: AC
  Administered 2013-12-01: 4 g via INTRAVENOUS
  Filled 2013-12-01: qty 100

## 2013-12-01 MED ORDER — PHENYLEPHRINE HCL 10 MG/ML IJ SOLN
10.0000 mg | INTRAMUSCULAR | Status: DC | PRN
  Administered 2013-12-01: 25 ug/min via INTRAVENOUS

## 2013-12-01 MED ORDER — FAMOTIDINE IN NACL 20-0.9 MG/50ML-% IV SOLN
20.0000 mg | Freq: Two times a day (BID) | INTRAVENOUS | Status: AC
Start: 2013-12-01 — End: 2013-12-02
  Administered 2013-12-01: 20 mg via INTRAVENOUS

## 2013-12-01 MED ORDER — SUCCINYLCHOLINE CHLORIDE 20 MG/ML IJ SOLN
INTRAMUSCULAR | Status: DC | PRN
  Administered 2013-12-01: 140 mg via INTRAVENOUS

## 2013-12-01 MED ORDER — LEVOFLOXACIN IN D5W 750 MG/150ML IV SOLN: 750.0000 mg | INTRAVENOUS | Status: DC

## 2013-12-01 MED ORDER — BIOTENE DRY MOUTH MT LIQD
15.0000 mL | Freq: Four times a day (QID) | OROMUCOSAL | Status: DC
  Administered 2013-12-01: 15 mL via OROMUCOSAL

## 2013-12-01 MED ORDER — SODIUM CHLORIDE 0.9 % IJ SOLN
3.0000 mL | INTRAMUSCULAR | Status: DC | PRN
Start: 2013-12-02 — End: 2013-12-02

## 2013-12-01 MED ORDER — ASPIRIN 81 MG PO CHEW: 324.0000 mg | CHEWABLE_TABLET | Freq: Every day | ORAL | Status: DC

## 2013-12-01 MED ORDER — BISACODYL 10 MG RE SUPP
10.0000 mg | Freq: Every day | RECTAL | Status: DC
  Administered 2013-12-06: 10 mg via RECTAL
  Filled 2013-12-01: qty 1

## 2013-12-01 MED ORDER — CHLORHEXIDINE GLUCONATE CLOTH 2 % EX PADS
6.0000 | MEDICATED_PAD | Freq: Every day | CUTANEOUS | Status: AC
  Administered 2013-12-02 – 2013-12-06 (×5): 6 via TOPICAL

## 2013-12-01 MED ORDER — 0.9 % SODIUM CHLORIDE (POUR BTL) OPTIME
TOPICAL | Status: DC | PRN
  Administered 2013-12-01: 5000 mL

## 2013-12-01 MED ORDER — ARTIFICIAL TEARS OP OINT
TOPICAL_OINTMENT | OPHTHALMIC | Status: AC
  Filled 2013-12-01: qty 3.5

## 2013-12-01 MED ORDER — SODIUM CHLORIDE 0.9 % IJ SOLN
OROMUCOSAL | Status: DC | PRN
  Administered 2013-12-01 (×3): via TOPICAL

## 2013-12-01 MED ORDER — POTASSIUM CHLORIDE 10 MEQ/50ML IV SOLN: 10.0000 meq | INTRAVENOUS | Status: AC

## 2013-12-01 MED ORDER — METOPROLOL TARTRATE 12.5 MG HALF TABLET
12.5000 mg | ORAL_TABLET | Freq: Two times a day (BID) | ORAL | Status: DC
  Administered 2013-12-02 – 2013-12-03 (×3): 12.5 mg via ORAL
  Filled 2013-12-01 (×7): qty 1

## 2013-12-01 MED ORDER — PROPOFOL 10 MG/ML IV BOLUS
INTRAVENOUS | Status: DC | PRN
  Administered 2013-12-01: 100 mg via INTRAVENOUS

## 2013-12-01 MED ORDER — PROTAMINE SULFATE 10 MG/ML IV SOLN
INTRAVENOUS | Status: AC
  Filled 2013-12-01: qty 25

## 2013-12-01 MED ORDER — DEXMEDETOMIDINE HCL IN NACL 200 MCG/50ML IV SOLN
0.1000 ug/kg/h | INTRAVENOUS | Status: DC
  Administered 2013-12-01: 0.5 ug/kg/h via INTRAVENOUS
  Filled 2013-12-01: qty 50

## 2013-12-01 MED ORDER — SODIUM CHLORIDE 0.9 % IV SOLN: 250.0000 mL | INTRAVENOUS | Status: DC

## 2013-12-01 MED ORDER — DEXTROSE 5 % IV SOLN
0.0000 ug/min | INTRAVENOUS | Status: DC
Start: 2013-12-01 — End: 2013-12-03
  Filled 2013-12-01 (×2): qty 2

## 2013-12-01 MED ORDER — HEPARIN SODIUM (PORCINE) 1000 UNIT/ML IJ SOLN
INTRAMUSCULAR | Status: AC
  Filled 2013-12-01: qty 1

## 2013-12-01 SURGICAL SUPPLY — 110 items
ADAPTER CARDIO PERF ANTE/RETRO (ADAPTER) ×3 IMPLANT
ATTRACTOMAT 16X20 MAGNETIC DRP (DRAPES) ×3 IMPLANT
BAG DECANTER FOR FLEXI CONT (MISCELLANEOUS) ×3 IMPLANT
BANDAGE ELASTIC 4 VELCRO ST LF (GAUZE/BANDAGES/DRESSINGS) ×6 IMPLANT
BANDAGE ELASTIC 6 VELCRO ST LF (GAUZE/BANDAGES/DRESSINGS) ×6 IMPLANT
BANDAGE GAUZE ELAST BULKY 4 IN (GAUZE/BANDAGES/DRESSINGS) ×6 IMPLANT
BASKET HEART (ORDER IN 25'S) (MISCELLANEOUS) ×1
BASKET HEART (ORDER IN 25S) (MISCELLANEOUS) ×2 IMPLANT
BENZOIN TINCTURE PRP APPL 2/3 (GAUZE/BANDAGES/DRESSINGS) ×3 IMPLANT
BLADE STERNUM SYSTEM 6 (BLADE) ×3 IMPLANT
BLADE SURG 11 STRL SS (BLADE) ×3 IMPLANT
BLADE SURG 12 STRL SS (BLADE) ×3 IMPLANT
BLADE SURG 15 STRL LF DISP TIS (BLADE) ×2 IMPLANT
BLADE SURG 15 STRL SS (BLADE) ×1
BLADE SURG ROTATE 9660 (MISCELLANEOUS) IMPLANT
CANISTER SUCTION 2500CC (MISCELLANEOUS) ×3 IMPLANT
CANNULA ARTERIAL NVNT 3/8 22FR (MISCELLANEOUS) ×3 IMPLANT
CANNULA GUNDRY RCSP 15FR (MISCELLANEOUS) ×3 IMPLANT
CANNULA VENOUS LOW PROF 32X40 (CANNULA) IMPLANT
CARDIAC SUCTION (MISCELLANEOUS) ×3 IMPLANT
CATH CPB KIT VANTRIGT (MISCELLANEOUS) ×3 IMPLANT
CATH HEART VENT LEFT (CATHETERS) ×2 IMPLANT
CATH RETROPLEGIA CORONARY 14FR (CATHETERS) IMPLANT
CATH ROBINSON RED A/P 18FR (CATHETERS) ×9 IMPLANT
CATH THORACIC 36FR RT ANG (CATHETERS) ×3 IMPLANT
CLIP FOGARTY SPRING 6M (CLIP) ×3 IMPLANT
CLIP RETRACTION 3.0MM CORONARY (MISCELLANEOUS) ×3 IMPLANT
CLIP TI WIDE RED SMALL 24 (CLIP) ×3 IMPLANT
COVER SURGICAL LIGHT HANDLE (MISCELLANEOUS) ×3 IMPLANT
CRADLE DONUT ADULT HEAD (MISCELLANEOUS) ×3 IMPLANT
DRAIN CHANNEL 32F RND 10.7 FF (WOUND CARE) ×3 IMPLANT
DRAPE CARDIOVASCULAR INCISE (DRAPES) ×1
DRAPE SLUSH/WARMER DISC (DRAPES) ×3 IMPLANT
DRAPE SRG 135X102X78XABS (DRAPES) ×2 IMPLANT
DRSG AQUACEL AG ADV 3.5X14 (GAUZE/BANDAGES/DRESSINGS) ×3 IMPLANT
ELECT BLADE 4.0 EZ CLEAN MEGAD (MISCELLANEOUS) ×3
ELECT BLADE 6.5 EXT (BLADE) ×3 IMPLANT
ELECT CAUTERY BLADE 6.4 (BLADE) ×3 IMPLANT
ELECT REM PT RETURN 9FT ADLT (ELECTROSURGICAL) ×6
ELECTRODE BLDE 4.0 EZ CLN MEGD (MISCELLANEOUS) ×2 IMPLANT
ELECTRODE REM PT RTRN 9FT ADLT (ELECTROSURGICAL) ×4 IMPLANT
GLOVE BIO SURGEON STRL SZ7.5 (GLOVE) ×9 IMPLANT
GOWN STRL REUS W/ TWL LRG LVL3 (GOWN DISPOSABLE) ×16 IMPLANT
GOWN STRL REUS W/TWL LRG LVL3 (GOWN DISPOSABLE) ×8
HEMOSTAT POWDER SURGIFOAM 1G (HEMOSTASIS) ×9 IMPLANT
HEMOSTAT SURGICEL 2X14 (HEMOSTASIS) ×6 IMPLANT
INSERT FOGARTY XLG (MISCELLANEOUS) ×3 IMPLANT
KIT BASIN OR (CUSTOM PROCEDURE TRAY) ×3 IMPLANT
KIT ROOM TURNOVER OR (KITS) ×3 IMPLANT
KIT SUCTION CATH 14FR (SUCTIONS) ×3 IMPLANT
KIT VASOVIEW W/TROCAR VH 2000 (KITS) ×3 IMPLANT
LEAD PACING MYOCARDI (MISCELLANEOUS) ×3 IMPLANT
LINE VENT (MISCELLANEOUS) ×3 IMPLANT
MARKER GRAFT CORONARY BYPASS (MISCELLANEOUS) ×9 IMPLANT
NS IRRIG 1000ML POUR BTL (IV SOLUTION) ×21 IMPLANT
PACK OPEN HEART (CUSTOM PROCEDURE TRAY) ×3 IMPLANT
PAD ARMBOARD 7.5X6 YLW CONV (MISCELLANEOUS) ×9 IMPLANT
PAD ELECT DEFIB RADIOL ZOLL (MISCELLANEOUS) ×3 IMPLANT
PENCIL BUTTON HOLSTER BLD 10FT (ELECTRODE) ×3 IMPLANT
PUNCH AORTIC ROTATE 4.0MM (MISCELLANEOUS) ×3 IMPLANT
PUNCH AORTIC ROTATE 4.5MM 8IN (MISCELLANEOUS) IMPLANT
PUNCH AORTIC ROTATE 5MM 8IN (MISCELLANEOUS) IMPLANT
SET CARDIOPLEGIA MPS 5001102 (MISCELLANEOUS) ×3 IMPLANT
SPONGE GAUZE 4X4 12PLY (GAUZE/BANDAGES/DRESSINGS) IMPLANT
SPONGE GAUZE 4X4 12PLY STER LF (GAUZE/BANDAGES/DRESSINGS) ×6 IMPLANT
STRIP CLOSURE SKIN 1/2X4 (GAUZE/BANDAGES/DRESSINGS) ×3 IMPLANT
SURGIFLO W/THROMBIN 8M KIT (HEMOSTASIS) ×3 IMPLANT
SUT BONE WAX W31G (SUTURE) ×3 IMPLANT
SUT ETHIBON 2 0 V 52N 30 (SUTURE) IMPLANT
SUT ETHIBOND 2 0 SH (SUTURE)
SUT ETHIBOND 2 0 SH 36X2 (SUTURE) IMPLANT
SUT MNCRL AB 4-0 PS2 18 (SUTURE) ×6 IMPLANT
SUT PROLENE 3 0 RB 1 (SUTURE) ×6 IMPLANT
SUT PROLENE 3 0 SH 1 (SUTURE) IMPLANT
SUT PROLENE 3 0 SH DA (SUTURE) IMPLANT
SUT PROLENE 3 0 SH1 36 (SUTURE) ×3 IMPLANT
SUT PROLENE 4 0 RB 1 (SUTURE) ×1
SUT PROLENE 4 0 SH DA (SUTURE) ×3 IMPLANT
SUT PROLENE 4-0 RB1 .5 CRCL 36 (SUTURE) ×2 IMPLANT
SUT PROLENE 5 0 C 1 36 (SUTURE) ×3 IMPLANT
SUT PROLENE 6 0 C 1 30 (SUTURE) ×3 IMPLANT
SUT PROLENE 6 0 CC (SUTURE) ×18 IMPLANT
SUT PROLENE 8 0 BV175 6 (SUTURE) IMPLANT
SUT PROLENE BLUE 7 0 (SUTURE) ×9 IMPLANT
SUT PROLENE POLY MONO (SUTURE) ×3 IMPLANT
SUT SILK  1 MH (SUTURE)
SUT SILK 1 MH (SUTURE) IMPLANT
SUT SILK 2 0 SH CR/8 (SUTURE) ×3 IMPLANT
SUT SILK 3 0 SH CR/8 (SUTURE) IMPLANT
SUT STEEL 6MS V (SUTURE) ×3 IMPLANT
SUT STEEL STERNAL CCS#1 18IN (SUTURE) ×6 IMPLANT
SUT STEEL SZ 6 DBL 3X14 BALL (SUTURE) ×3 IMPLANT
SUT VIC AB 1 CTX 18 (SUTURE) ×6 IMPLANT
SUT VIC AB 1 CTX 36 (SUTURE) ×3
SUT VIC AB 1 CTX36XBRD ANBCTR (SUTURE) ×6 IMPLANT
SUT VIC AB 2-0 CT1 27 (SUTURE) ×2
SUT VIC AB 2-0 CT1 TAPERPNT 27 (SUTURE) ×4 IMPLANT
SUT VIC AB 2-0 CTX 27 (SUTURE) IMPLANT
SUT VIC AB 3-0 X1 27 (SUTURE) IMPLANT
SUTURE E-PAK OPEN HEART (SUTURE) ×3 IMPLANT
SYR 10ML KIT SKIN ADHESIVE (MISCELLANEOUS) IMPLANT
SYSTEM SAHARA CHEST DRAIN ATS (WOUND CARE) ×3 IMPLANT
TAPE CLOTH SURG 4X10 WHT LF (GAUZE/BANDAGES/DRESSINGS) ×3 IMPLANT
TOWEL OR 17X24 6PK STRL BLUE (TOWEL DISPOSABLE) ×6 IMPLANT
TOWEL OR 17X26 10 PK STRL BLUE (TOWEL DISPOSABLE) ×9 IMPLANT
TRAY FOLEY IC TEMP SENS 16FR (CATHETERS) ×3 IMPLANT
TUBING INSUFFLATION 10FT LAP (TUBING) ×3 IMPLANT
UNDERPAD 30X30 INCONTINENT (UNDERPADS AND DIAPERS) ×3 IMPLANT
VENT LEFT HEART 12002 (CATHETERS) ×3
WATER STERILE IRR 1000ML POUR (IV SOLUTION) ×6 IMPLANT

## 2013-12-01 NOTE — Progress Notes (Signed)
S/p CABG x 4  Starting to wake, weaning vent  BP 96/61  Pulse 89  Temp(Src) 99 F (37.2 C) (Core (Comment))  Resp 8  Ht 5\' 7"  (1.702 m)  Wt 271 lb 2.7 oz (123 kg)  BMI 41.82 kg/m2  SpO2 97%   Intake/Output Summary (Last 24 hours) at 12/01/13 1814 Last data filed at 12/01/13 1800  Gross per 24 hour  Intake 4870.74 ml  Output   1510 ml  Net 3360.74 ml    CI= 1.99  Doing well early postop

## 2013-12-01 NOTE — Brief Op Note (Signed)
11/25/2013 - 12/01/2013  11:26 AM  PATIENT:  Brandon Klein  74 y.o. male  PRE-OPERATIVE DIAGNOSIS:  CAD  POST-OPERATIVE DIAGNOSIS:  CAD  PROCEDURE:   CORONARY ARTERY BYPASS GRAFTING x 4 (LIMA-LAD, SVG-D, SVG-Cx, SVG-PD) ENDOSCOPIC VEIN HARVEST RIGHT LEG AND LEFT THIGH  SURGEON:  Kerin Perna, MD  ASSISTANT: Coral Ceo, PA-C  ANESTHESIA:   general  PATIENT CONDITION:  ICU - intubated and hemodynamically stable.  PRE-OPERATIVE WEIGHT: 123 kg

## 2013-12-01 NOTE — Progress Notes (Signed)
Echo Lab Transesophageal Echocardiogram completed.  Drinda Belgard L Ixchel Duck, RDCS 12/01/2013 8:31 AM

## 2013-12-01 NOTE — Anesthesia Postprocedure Evaluation (Signed)
  Anesthesia Post-op Note  Patient: Brandon Klein  Procedure(s) Performed: Procedure(s): CORONARY ARTERY BYPASS GRAFT TIMES FOUR USING LEFT INTERNAL MAMMARY ARTERY TO LAD, SAPHENOUS VEIN GRAFTS TO DIAGONAL, CIRCUMFELX AND POSTERIOR DESCENDING (N/A)  Patient Location: SICU  Anesthesia Type:General  Level of Consciousness: sedated and unresponsive  Airway and Oxygen Therapy: Patient remains intubated per anesthesia plan and Patient placed on Ventilator (see vital sign flow sheet for setting)  Post-op Pain: none  Post-op Assessment: Post-op Vital signs reviewed, Patient's Cardiovascular Status Stable, Respiratory Function Stable, Patent Airway and No signs of Nausea or vomiting  Post-op Vital Signs: Reviewed and stable  Last Vitals:  Filed Vitals:   12/01/13 0514  BP: 144/73  Pulse: 72  Temp: 36.6 C  Resp: 18    Complications: No apparent anesthesia complications

## 2013-12-01 NOTE — Progress Notes (Signed)
Wean protocol. 

## 2013-12-01 NOTE — Progress Notes (Signed)
The patient was examined and preop studies reviewed. There has been no change from the prior exam and the patient is ready for surgery.  plan CABG , possible AVR on J Vivero today

## 2013-12-01 NOTE — Transfer of Care (Signed)
Immediate Anesthesia Transfer of Care Note  Patient: CEDRIE SKAGGS  Procedure(s) Performed: Procedure(s): CORONARY ARTERY BYPASS GRAFT TIMES FOUR USING LEFT INTERNAL MAMMARY ARTERY TO LAD, SAPHENOUS VEIN GRAFTS TO DIAGONAL, CIRCUMFELX AND POSTERIOR DESCENDING (N/A)  Patient Location: SICU  Anesthesia Type:General  Level of Consciousness: sedated and unresponsive  Airway & Oxygen Therapy: Patient remains intubated per anesthesia plan and Patient placed on Ventilator (see vital sign flow sheet for setting)  Post-op Assessment: Report given to PACU RN and Post -op Vital signs reviewed and stable  Post vital signs: Reviewed and stable  Complications: No apparent anesthesia complications

## 2013-12-01 NOTE — Anesthesia Preprocedure Evaluation (Signed)
Anesthesia Evaluation  Patient identified by MRN, date of birth, ID band Patient awake    Reviewed: Allergy & Precautions, H&P , NPO status , Patient's Chart, lab work & pertinent test results, reviewed documented beta blocker date and time   Airway Mallampati: II TM Distance: >3 FB Neck ROM: full    Dental   Pulmonary neg pulmonary ROS, former smoker,  breath sounds clear to auscultation        Cardiovascular hypertension, On Medications + angina + CAD and + Cardiac Stents + dysrhythmias Atrial Fibrillation + pacemaker + Valvular Problems/Murmurs AS Rhythm:regular     Neuro/Psych negative neurological ROS  negative psych ROS   GI/Hepatic Neg liver ROS, GERD-  Medicated and Controlled,  Endo/Other  negative endocrine ROS  Renal/GU negative Renal ROS  negative genitourinary   Musculoskeletal   Abdominal   Peds  Hematology negative hematology ROS (+)   Anesthesia Other Findings See surgeon's H&P   Reproductive/Obstetrics negative OB ROS                           Anesthesia Physical Anesthesia Plan  ASA: III  Anesthesia Plan: General   Post-op Pain Management:    Induction: Intravenous  Airway Management Planned: Oral ETT and Video Laryngoscope Planned  Additional Equipment: Arterial line, CVP, PA Cath, TEE and Ultrasound Guidance Line Placement  Intra-op Plan:   Post-operative Plan: Post-operative intubation/ventilation  Informed Consent: I have reviewed the patients History and Physical, chart, labs and discussed the procedure including the risks, benefits and alternatives for the proposed anesthesia with the patient or authorized representative who has indicated his/her understanding and acceptance.   Dental Advisory Given  Plan Discussed with: CRNA and Surgeon  Anesthesia Plan Comments:         Anesthesia Quick Evaluation

## 2013-12-01 NOTE — Progress Notes (Signed)
12/01/13 2253  BiPAP/CPAP/SIPAP  BiPAP/CPAP/SIPAP Pt Type Adult  Mask Type Full face mask  Mask Size Large  Respiratory Rate 14 breaths/min  IPAP 10 cmH20  EPAP 5 cmH2O  Oxygen Percent 50 %  Minute Ventilation 9.7  Leak 69  Peak Inspiratory Pressure (PIP) 11  Tidal Volume (Vt) 421  BiPAP/CPAP/SIPAP BiPAP  Patient Home Equipment No  Auto Titrate No  Press High Alarm 25 cmH2O  Press Low Alarm 5 cmH2O  BiPAP/CPAP /SiPAP Vitals  SpO2 96 %  Patient placed on NIV BIPAP while in in ICU. He will be placed on CPAP when transfer to floor per home settings.

## 2013-12-01 NOTE — Procedures (Signed)
Extubation Procedure Note  Patient Details:   Name: Brandon Klein DOB: 1940/08/20 MRN: [id number]   Airway Documentation:     Evaluation  O2 sats: stable throughout Complications: No apparent complications Patient did tolerate procedure well. Bilateral Breath Sounds: Clear   Yes  Extubated per protocol to a  4L Nasal Cannula.  NIF -30, VC .  Cuff Leak was positive.  IS instruction was given .  Patient tolerated well, no Stridor noted.  Will continue to monitor.  Morley Kos B 12/01/2013, 6:28 PM

## 2013-12-02 ENCOUNTER — Inpatient Hospital Stay (HOSPITAL_COMMUNITY): Payer: Medicare Other

## 2013-12-02 DIAGNOSIS — D696 Thrombocytopenia, unspecified: Secondary | ICD-10-CM | POA: Diagnosis not present

## 2013-12-02 DIAGNOSIS — I2510 Atherosclerotic heart disease of native coronary artery without angina pectoris: Secondary | ICD-10-CM | POA: Diagnosis not present

## 2013-12-02 DIAGNOSIS — T82897A Other specified complication of cardiac prosthetic devices, implants and grafts, initial encounter: Secondary | ICD-10-CM | POA: Diagnosis not present

## 2013-12-02 DIAGNOSIS — Z6841 Body Mass Index (BMI) 40.0 and over, adult: Secondary | ICD-10-CM | POA: Diagnosis not present

## 2013-12-02 LAB — CBC
HCT: 27.9 % — ABNORMAL LOW (ref 39.0–52.0)
HEMATOCRIT: 30.3 % — AB (ref 39.0–52.0)
HEMOGLOBIN: 9.7 g/dL — AB (ref 13.0–17.0)
Hemoglobin: 10.3 g/dL — ABNORMAL LOW (ref 13.0–17.0)
MCH: 30.5 pg (ref 26.0–34.0)
MCH: 29.6 pg (ref 26.0–34.0)
MCHC: 34.8 g/dL (ref 30.0–36.0)
MCHC: 34 g/dL (ref 30.0–36.0)
MCV: 87.7 fL (ref 78.0–100.0)
MCV: 87.1 fL (ref 78.0–100.0)
Platelets: 91 10*3/uL — ABNORMAL LOW (ref 150–400)
Platelets: 123 10*3/uL — ABNORMAL LOW (ref 150–400)
RBC: 3.18 MIL/uL — AB (ref 4.22–5.81)
RBC: 3.48 MIL/uL — ABNORMAL LOW (ref 4.22–5.81)
RDW: 14 % (ref 11.5–15.5)
RDW: 14.5 % (ref 11.5–15.5)
WBC: 7.4 10*3/uL (ref 4.0–10.5)
WBC: 14.2 10*3/uL — ABNORMAL HIGH (ref 4.0–10.5)

## 2013-12-02 LAB — POCT I-STAT 3, ART BLOOD GAS (G3+)
Acid-base deficit: 2 mmol/L (ref 0.0–2.0)
BICARBONATE: 22.7 meq/L (ref 20.0–24.0)
O2 Saturation: 95 %
PCO2 ART: 40.2 mmHg (ref 35.0–45.0)
PH ART: 7.361 (ref 7.350–7.450)
PO2 ART: 79 mmHg — AB (ref 80.0–100.0)
Patient temperature: 37.5
TCO2: 24 mmol/L (ref 0–100)

## 2013-12-02 LAB — BASIC METABOLIC PANEL
Anion gap: 14 (ref 5–15)
BUN: 21 mg/dL (ref 6–23)
CHLORIDE: 102 meq/L (ref 96–112)
CO2: 22 mEq/L (ref 19–32)
Calcium: 7.8 mg/dL — ABNORMAL LOW (ref 8.4–10.5)
Creatinine, Ser: 1.22 mg/dL (ref 0.50–1.35)
GFR calc Af Amer: 67 mL/min — ABNORMAL LOW (ref >90)
GFR calc non Af Amer: 57 mL/min — ABNORMAL LOW (ref >90)
GLUCOSE: 110 mg/dL — AB (ref 70–99)
POTASSIUM: 4 meq/L (ref 3.7–5.3)
Sodium: 138 mEq/L (ref 137–147)

## 2013-12-02 LAB — GLUCOSE, CAPILLARY
GLUCOSE-CAPILLARY: 111 mg/dL — AB (ref 70–99)
GLUCOSE-CAPILLARY: 116 mg/dL — AB (ref 70–99)
GLUCOSE-CAPILLARY: 107 mg/dL — AB (ref 70–99)
GLUCOSE-CAPILLARY: 123 mg/dL — AB (ref 70–99)
GLUCOSE-CAPILLARY: 134 mg/dL — AB (ref 70–99)
Glucose-Capillary: 109 mg/dL — ABNORMAL HIGH (ref 70–99)
Glucose-Capillary: 119 mg/dL — ABNORMAL HIGH (ref 70–99)
Glucose-Capillary: 130 mg/dL — ABNORMAL HIGH (ref 70–99)
Glucose-Capillary: 134 mg/dL — ABNORMAL HIGH (ref 70–99)
Glucose-Capillary: 165 mg/dL — ABNORMAL HIGH (ref 70–99)
Glucose-Capillary: 172 mg/dL — ABNORMAL HIGH (ref 70–99)

## 2013-12-02 LAB — POCT I-STAT, CHEM 8
BUN: 21 mg/dL (ref 6–23)
CALCIUM ION: 1.16 mmol/L (ref 1.13–1.30)
CHLORIDE: 100 meq/L (ref 96–112)
CREATININE: 1.4 mg/dL — AB (ref 0.50–1.35)
GLUCOSE: 161 mg/dL — AB (ref 70–99)
HCT: 31 % — ABNORMAL LOW (ref 39.0–52.0)
Hemoglobin: 10.5 g/dL — ABNORMAL LOW (ref 13.0–17.0)
POTASSIUM: 3.9 meq/L (ref 3.7–5.3)
Sodium: 136 mEq/L — ABNORMAL LOW (ref 137–147)
TCO2: 20 mmol/L (ref 0–100)

## 2013-12-02 LAB — CREATININE, SERUM
Creatinine, Ser: 1.34 mg/dL (ref 0.50–1.35)
GFR calc Af Amer: 59 mL/min — ABNORMAL LOW (ref >90)
GFR calc non Af Amer: 51 mL/min — ABNORMAL LOW (ref >90)

## 2013-12-02 LAB — PROTIME-INR
INR: 1.19 (ref 0.00–1.49)
Prothrombin Time: 15.1 seconds (ref 11.6–15.2)

## 2013-12-02 LAB — MAGNESIUM
MAGNESIUM: 2.2 mg/dL (ref 1.5–2.5)
MAGNESIUM: 2.1 mg/dL (ref 1.5–2.5)

## 2013-12-02 MED ORDER — AMIODARONE HCL IN DEXTROSE 360-4.14 MG/200ML-% IV SOLN
INTRAVENOUS | Status: AC
  Filled 2013-12-02: qty 200

## 2013-12-02 MED ORDER — INSULIN DETEMIR 100 UNIT/ML ~~LOC~~ SOLN
14.0000 [IU] | Freq: Two times a day (BID) | SUBCUTANEOUS | Status: DC
  Administered 2013-12-02 – 2013-12-04 (×6): 14 [IU] via SUBCUTANEOUS
  Filled 2013-12-02 (×7): qty 0.14

## 2013-12-02 MED ORDER — AMIODARONE HCL IN DEXTROSE 360-4.14 MG/200ML-% IV SOLN
30.0000 mg/h | INTRAVENOUS | Status: DC
  Filled 2013-12-02 (×4): qty 200

## 2013-12-02 MED ORDER — POTASSIUM CHLORIDE 10 MEQ/50ML IV SOLN
10.0000 meq | INTRAVENOUS | Status: AC
  Administered 2013-12-02 (×2): 10 meq via INTRAVENOUS
  Filled 2013-12-02 (×2): qty 50

## 2013-12-02 MED ORDER — AMIODARONE LOAD VIA INFUSION
150.0000 mg | Freq: Once | INTRAVENOUS | Status: AC
  Administered 2013-12-02: 150 mg via INTRAVENOUS
  Filled 2013-12-02: qty 83.34

## 2013-12-02 MED ORDER — FUROSEMIDE 10 MG/ML IJ SOLN
20.0000 mg | Freq: Two times a day (BID) | INTRAMUSCULAR | Status: DC
  Administered 2013-12-02 – 2013-12-03 (×3): 20 mg via INTRAVENOUS
  Filled 2013-12-02 (×5): qty 2

## 2013-12-02 MED ORDER — INSULIN ASPART 100 UNIT/ML ~~LOC~~ SOLN
0.0000 [IU] | SUBCUTANEOUS | Status: DC
Start: 2013-12-02 — End: 2013-12-03
  Administered 2013-12-02: 2 [IU] via SUBCUTANEOUS
  Administered 2013-12-02: 4 [IU] via SUBCUTANEOUS
  Administered 2013-12-02 – 2013-12-03 (×3): 2 [IU] via SUBCUTANEOUS
  Administered 2013-12-03: 4 [IU] via SUBCUTANEOUS

## 2013-12-02 MED ORDER — AMIODARONE HCL IN DEXTROSE 360-4.14 MG/200ML-% IV SOLN
60.0000 mg/h | INTRAVENOUS | Status: AC
  Administered 2013-12-02 (×2): 60 mg/h via INTRAVENOUS
  Filled 2013-12-02: qty 200

## 2013-12-02 MED ORDER — DOPAMINE-DEXTROSE 3.2-5 MG/ML-% IV SOLN: 2.0000 ug/kg/min | INTRAVENOUS | Status: DC

## 2013-12-02 MED ORDER — POTASSIUM CHLORIDE CRYS ER 20 MEQ PO TBCR: 20.0000 meq | EXTENDED_RELEASE_TABLET | Freq: Two times a day (BID) | ORAL | Status: DC

## 2013-12-02 MED FILL — Sodium Bicarbonate IV Soln 8.4%: INTRAVENOUS | Qty: 50 | Status: AC

## 2013-12-02 MED FILL — Heparin Sodium (Porcine) Inj 1000 Unit/ML: INTRAMUSCULAR | Qty: 10 | Status: AC

## 2013-12-02 MED FILL — Sodium Chloride IV Soln 0.9%: INTRAVENOUS | Qty: 2000 | Status: AC

## 2013-12-02 MED FILL — Heparin Sodium (Porcine) Inj 1000 Unit/ML: INTRAMUSCULAR | Qty: 30 | Status: AC

## 2013-12-02 MED FILL — Lidocaine HCl IV Inj 20 MG/ML: INTRAVENOUS | Qty: 5 | Status: AC

## 2013-12-02 MED FILL — Magnesium Sulfate Inj 50%: INTRAMUSCULAR | Qty: 10 | Status: AC

## 2013-12-02 MED FILL — Potassium Chloride Inj 2 mEq/ML: INTRAVENOUS | Qty: 40 | Status: AC

## 2013-12-02 MED FILL — Mannitol IV Soln 20%: INTRAVENOUS | Qty: 500 | Status: AC

## 2013-12-02 MED FILL — Electrolyte-R (PH 7.4) Solution: INTRAVENOUS | Qty: 3000 | Status: AC

## 2013-12-02 NOTE — Progress Notes (Signed)
1 Day Post-Op Procedure(s) (LRB): CORONARY ARTERY BYPASS GRAFT TIMES FOUR USING LEFT INTERNAL MAMMARY ARTERY TO LAD, SAPHENOUS VEIN GRAFTS TO DIAGONAL, CIRCUMFELX AND POSTERIOR DESCENDING (N/A) Subjective: cabgx4 Neuro intact On CPAP for OSA, obesity Will DC lines and mobilize today  Objective: Vital signs in last 24 hours: Temp:  [98.3 F (36.8 C)-99.9 F (37.7 C)] 98.3 F (36.8 C) (07/07 1131) Pulse Rate:  [58-127] 77 (07/07 1315) Cardiac Rhythm:  [-] Other (Comment) (07/07 0800) Resp:  [8-26] 21 (07/07 1315) BP: (83-131)/(41-85) 131/85 mmHg (07/07 1300) SpO2:  [85 %-100 %] 100 % (07/07 1315) Arterial Line BP: (78-156)/(41-66) 136/49 mmHg (07/07 1315) FiO2 (%):  [40 %-50 %] 40 % (07/06 1800) Weight:  [285 lb 4.4 oz (129.4 kg)] 285 lb 4.4 oz (129.4 kg) (07/07 0500)  Hemodynamic parameters for last 24 hours: PAP: (28-46)/(10-23) 36/17 mmHg CO:  [3.8 L/min-6.6 L/min] 6.2 L/min CI:  [1.6 L/min/m2-2.9 L/min/m2] 2.8 L/min/m2  Intake/Output from previous day: 07/06 0701 - 07/07 0700 In: 5976.8 [I.V.:3846.8; Blood:500; NG/GT:30; IV Piggyback:1600] Out: 2800 [Urine:2120; Emesis/NG output:100; Chest Tube:580] Intake/Output this shift: Total I/O In: 383.9 [I.V.:33.9; IV Piggyback:350] Out: 1155 [Urine:985; Chest Tube:170]  EXAM  Lungs clear abd soft extrem warm  Lab Results:  Recent Labs  12/01/13 2006 12/01/13 2010 12/02/13 0415  WBC 15.2*  --  7.4  HGB 10.5* 10.5* 9.7*  HCT 30.5* 31.0* 27.9*  PLT 144*  --  91*   BMET:  Recent Labs  12/01/13 0355  12/01/13 2010 12/02/13 0415  NA 138  < > 137 138  K 4.2  < > 4.0 4.0  CL 100  --  103 102  CO2 23  --   --  22  GLUCOSE 120*  < > 167* 110*  BUN 21  --  20 21  CREATININE 1.19  < > 1.30 1.22  CALCIUM 9.6  --   --  7.8*  < > = values in this interval not displayed.  PT/INR:  Recent Labs  12/02/13 0415  LABPROT 15.1  INR 1.19   ABG    Component Value Date/Time   PHART 7.361 12/02/2013 0407   HCO3 22.7  12/02/2013 0407   TCO2 24 12/02/2013 0407   ACIDBASEDEF 2.0 12/02/2013 0407   O2SAT 95.0 12/02/2013 0407   CBG (last 3)   Recent Labs  12/02/13 0402 12/02/13 0519 12/02/13 0637  GLUCAP 107* 123* 134*    Assessment/Plan: S/P Procedure(s) (LRB): CORONARY ARTERY BYPASS GRAFT TIMES FOUR USING LEFT INTERNAL MAMMARY ARTERY TO LAD, SAPHENOUS VEIN GRAFTS TO DIAGONAL, CIRCUMFELX AND POSTERIOR DESCENDING (N/A) See progression orders Glucose control with BID lantus and sliding scale Resume preop coumadin Follow low plt count 91k   LOS: 7 days    Brandon Klein,Brandon Klein 12/02/2013

## 2013-12-02 NOTE — Progress Notes (Signed)
Dr. Donata Clay to bedside.  Verbal order to restart dopamine at 26mcg/kg/min.  Will initiate and continue to monitor.

## 2013-12-02 NOTE — Progress Notes (Signed)
Patient ID: Brandon Klein, male   DOB: 02-06-1941, 73 y.o.   MRN: [id number]  SICU Evening Rounds:  Hemodynamically stable  sats 94%  Urine output ok  CT output low.  BMET    Component Value Date/Time   NA 136* 12/02/2013 1653   K 3.9 12/02/2013 1653   CL 100 12/02/2013 1653   CO2 22 12/02/2013 0415   GLUCOSE 161* 12/02/2013 1653   BUN 21 12/02/2013 1653   CREATININE 1.40* 12/02/2013 1653   CALCIUM 7.8* 12/02/2013 0415   GFRNONAA 51* 12/02/2013 1649   GFRAA 59* 12/02/2013 1649    CBC    Component Value Date/Time   WBC 14.2* 12/02/2013 1649   RBC 3.48* 12/02/2013 1649   HGB 10.5* 12/02/2013 1653   HCT 31.0* 12/02/2013 1653   PLT 123* 12/02/2013 1649   MCV 87.1 12/02/2013 1649   MCH 29.6 12/02/2013 1649   MCHC 34.0 12/02/2013 1649   RDW 14.5 12/02/2013 1649   LYMPHSABS 1.1 11/19/2013 1200   MONOABS 0.5 11/19/2013 1200   EOSABS 0.1 11/19/2013 1200   BASOSABS 0.0 11/19/2013 1200   A/P: stable day. Continue present course.

## 2013-12-02 NOTE — Progress Notes (Signed)
Pt converted to afib with HR as high as 130s.  5mg  IV Lopressor given which slowed the rate to 90s-100s but did not convert to NSR.  Pt 12.5mg  PO lopressor given early with no success with conversion from Afib.  Paged Dr. Laneta Simmers to advise.  Received verbal roder for Amio protocol.  Will initiate and continue to monitor pt closely.

## 2013-12-02 NOTE — Progress Notes (Signed)
  Amiodarone Drug - Drug Interaction Consult Note  Recommendations: Continue daily INR and slow dose titration for warfarin initiation.  Amiodarone is metabolized by the cytochrome P450 system and therefore has the potential to cause many drug interactions. Amiodarone has an average plasma half-life of 50 days (range 20 to 100 days).   There is potential for drug interactions to occur several weeks or months after stopping treatment and the onset of drug interactions may be slow after initiating amiodarone.   [x]  Statins: Increased risk of myopathy. Simvastatin- restrict dose to 20mg  daily.  **On Atorvastatin - dose ok, kbh Other statins: counsel patients to report any muscle pain or weakness immediately.  [x]  Anticoagulants: Amiodarone can increase anticoagulant effect. Consider warfarin dose reduction. Patients should be monitored closely and the dose of anticoagulant altered accordingly, remembering that amiodarone levels take several weeks to stabilize.  []  Antiepileptics: Amiodarone can increase plasma concentration of phenytoin, the dose should be reduced. Note that small changes in phenytoin dose can result in large changes in levels. Monitor patient and counsel on signs of toxicity.  [x]  Beta blockers: increased risk of bradycardia, AV block and myocardial depression. Sotalol - avoid concomitant use.  []   Calcium channel blockers (diltiazem and verapamil): increased risk of bradycardia, AV block and myocardial depression.  []   Cyclosporine: Amiodarone increases levels of cyclosporine. Reduced dose of cyclosporine is recommended.  []  Digoxin dose should be halved when amiodarone is started.  [x]  Diuretics: increased risk of cardiotoxicity if hypokalemia occurs.  []  Oral hypoglycemic agents (glyburide, glipizide, glimepiride): increased risk of hypoglycemia. Patient's glucose levels should be monitored closely when initiating amiodarone therapy.   []  Drugs that prolong the QT  interval:  Torsades de pointes risk may be increased with concurrent use - avoid if possible.  Monitor QTc, also keep magnesium/potassium WNL if concurrent therapy can't be avoided. Marland Kitchen Antibiotics: e.g. fluoroquinolones, erythromycin. . Antiarrhythmics: e.g. quinidine, procainamide, disopyramide, sotalol. . Antipsychotics: e.g. phenothiazines, haloperidol.  . Lithium, tricyclic antidepressants, and methadone. Thank You,  Brandon Melnick  12/02/2013 8:11 PM

## 2013-12-02 NOTE — Plan of Care (Signed)
Problem: Phase II - Intermediate Post-Op Goal: CBGs/Blood Glucose per SCIP Criteria Outcome: Not Progressing Remains on stabilizer

## 2013-12-02 NOTE — Op Note (Signed)
NAME:  MANNIE, OHLIN NO.:  [id number]  MEDICAL RECORD NO.:  [id number]  LOCATION:  2S03C                        FACILITY:  [facility]  PHYSICIAN:  Kerin Perna, M.D.  DATE OF BIRTH:  March 25, 1941  DATE OF PROCEDURE:  12/01/2013 DATE OF DISCHARGE:                              OPERATIVE REPORT   OPERATIONS: 1. Coronary artery bypass grafting x4 (left internal mammary artery to     left anterior descending artery, saphenous vein graft to diagonal,     saphenous vein graft to obtuse marginal, saphenous vein graft to     posterior descending). 2. Endoscopic harvest of right and left leg greater saphenous vein.  SURGEON:  Kerin Perna, M.D.  ASSISTANT:  Coral Ceo, PA-C.  ANESTHESIA:  General by Dr. Hart Robinsons.  PREOPERATIVE DIAGNOSIS:  Unstable angina with severe multivessel coronary artery disease, mild aortic stenosis with valve area 1.7.  POSTOPERATIVE DIAGNOSIS:  Unstable angina with severe multivessel coronary artery disease, mild aortic stenosis with valve area 1.7.  INDICATIONS:  The patient is a 73 year old, morbidly obese, Caucasian male with hypertension and prior history of coronary artery disease, status post PCI of the LAD and right coronary arteries.  He presented with unstable angina with negative enzymes.  Cardiac catheterization demonstrated high-grade stenosis of the LAD and circumflex and moderate stenosis of the distal RCA.  EF was fairly well preserved. Echocardiogram demonstrated mild aortic stenosis with a trileaflet valve.  He was recommended for multivessel CABG.  Prior to surgery, I examined the patient in his hospital room and reviewed the results of his cardiac catheterization and 2D echocardiogram.  I discussed the indications and expected benefits of coronary artery bypass surgery for treatment of his coronary artery disease.  I reviewed the alternative to surgical therapy as well.  I discussed with the patient the  major aspects of the operative procedure including the use of general anesthesia and cardiopulmonary bypass, the location of the surgical incisions, and the expected postoperative hospital recovery.  I reviewed with the patient the risks to him of coronary artery bypass surgery including risks of MI, stroke, bleeding requiring transfusion, infection, postop pleural effusion, postoperative arrhythmia, and death.  After reviewing these issues, he demonstrated his understanding and agreed to proceed with surgery under what I felt was an informed consent.  OPERATIVE FINDINGS: 1. Transesophageal echo showed no significant aortic stenosis and AVR     was not indicated. 2. Adequate conduit, which required vein harvest from both legs. 3. Heavily diseased suboptimal targets in the LAD, diagonal and distal     RCA. 4. No intraoperative blood products were used for this operation.  OPERATIVE PROCEDURE:  The patient was brought to the operating room and placed supine on the operating room table where general anesthesia was induced under invasive hemodynamic monitoring.  The chest, abdomen, and legs were prepped with Betadine and draped as a sterile field.  A proper time-out was performed.  The transesophageal echo was reviewed with the anesthesiologist who found that there was only mild aortic stenosis, not requiring valve replacement.  A sternal incision was made as the saphenous vein was harvested endoscopically from both legs.  The left  internal mammary artery was harvested as a pedicle graft from its origin at the subclavian vessels. It was 1.5-mm vessel with good flow.  The sternal retractor was placed using the deep blades due to the patient's obese body habitus.  The pericardium was opened and suspended.  The ascending aorta was very short.  Pursestrings were placed in the ascending aorta and right atrium, and after the vein was harvested, heparin was dosed and the ACT was documented as  being therapeutic.  The patient was then cannulated and placed on cardiopulmonary bypass.  The coronary arteries were identified for grafting.  These were suboptimal heavily diseased targets.  The mammary artery and vein grafts were prepared for the distal anastomoses and cardioplegia cannulas were placed for both antegrade and retrograde cold blood cardioplegia.  The patient was cooled to 32 degrees and aortic crossclamp was applied.  An 800 mL of cold blood cardioplegia was delivered in split doses between the antegrade aortic and retrograde coronary sinus catheters.  There was good cardioplegic arrest and septal temperature dropped less than 12 degrees.  Cardioplegia was delivered every 20 minutes while the crossclamp was in place.  The distal coronary anastomoses were then performed.  The first distal anastomosis was to the posterior descending branch of the right coronary artery.  There was a proximal 80% distal stenosis.  The posterior descending was 1.5-mm vessel.  A reverse saphenous vein was sewn end-to- side with running 7-0 Prolene with good flow through the graft. Cardioplegia was redosed.  The second distal anastomosis was to the OM branch of the circumflex. This was a 1.7-mm vessel with proximal 90% stenosis.  A reverse saphenous vein was sewn end-to-side with running 7-0 Prolene with good flow through the graft.  Cardioplegia was redosed.  Third distal anastomosis was to the diagonal branch to the LAD.  This was a smaller 1.4-mm vessel with proximal ostial 90% stenosis.  A reverse saphenous vein was sewn end-to-side with running 7-0 Prolene with good flow through the graft.  Cardioplegia was redosed.  The fourth distal anastomosis was to the mid LAD, past the previously placed stents.  The left IMA pedicle was brought through an opening in the left lateral pericardium, was brought down onto the LAD and sewn end- to-side with running 8-0 Prolene.  There was good flow  through the anastomosis after briefly releasing the pedicle bulldog on the mammary artery.  The bulldog was reapplied and the pedicle was secured to the epicardium. Cardioplegia was redosed.  Other crossclamp was still in place, three proximal vein anastomoses were performed on the ascending aorta using a 4.0-mm punch and running 6- 0 Prolene.  Prior to tying down the final proximal anastomosis, air was vented from the coronaries with a dose of retrograde warm blood cardioplegia.  The crossclamp was removed.  The heart was cardioverted back to a regular rhythm.  Vein grafts were de-aired and opened and each had good flow and hemostasis was documented at the proximal and distal anastomoses.  Pacing wires were applied.  The patient was rewarmed and reperfused.  The lungs were expanded and the ventilator was resumed.  The patient was weaned off cardiopulmonary bypass without difficulty.  No inotropes were required.  Echocardiogram showed good LV function.  Cardiac output was 7 liters/minute.  Protamine was administered without adverse reaction.  The cannula was removed. The mediastinum was irrigated with warm saline.  The superior pericardial fat was closed over the aorta.  Anterior mediastinal and a left pleural chest tube  were placed and brought out through separate incisions.  The sternum was closed with wire.  The pectoralis fascia was closed with interrupted #1 Vicryl sutures.  The subcutaneous and skin layers were closed with running Vicryl and sterile dressings were applied.  Total cardiopulmonary bypass time was 130 minutes.     Kerin PernaPeter Van Trigt, M.D.     PV/MEDQ  D:  12/01/2013  T:  12/02/2013  Job:  [id number]  cc:   Armanda Magicraci Turner, M.D.

## 2013-12-03 ENCOUNTER — Inpatient Hospital Stay (HOSPITAL_COMMUNITY): Payer: Medicare Other

## 2013-12-03 ENCOUNTER — Encounter (HOSPITAL_COMMUNITY): Payer: Self-pay | Admitting: Cardiothoracic Surgery

## 2013-12-03 LAB — BASIC METABOLIC PANEL
Anion gap: 14 (ref 5–15)
BUN: 25 mg/dL — ABNORMAL HIGH (ref 6–23)
CO2: 23 mEq/L (ref 19–32)
Calcium: 8.1 mg/dL — ABNORMAL LOW (ref 8.4–10.5)
Chloride: 97 mEq/L (ref 96–112)
Creatinine, Ser: 1.49 mg/dL — ABNORMAL HIGH (ref 0.50–1.35)
GFR calc Af Amer: 52 mL/min — ABNORMAL LOW (ref >90)
GFR calc non Af Amer: 45 mL/min — ABNORMAL LOW (ref >90)
Glucose, Bld: 143 mg/dL — ABNORMAL HIGH (ref 70–99)
Potassium: 4 mEq/L (ref 3.7–5.3)
Sodium: 134 mEq/L — ABNORMAL LOW (ref 137–147)

## 2013-12-03 LAB — GLUCOSE, CAPILLARY
GLUCOSE-CAPILLARY: 152 mg/dL — AB (ref 70–99)
GLUCOSE-CAPILLARY: 130 mg/dL — AB (ref 70–99)
GLUCOSE-CAPILLARY: 116 mg/dL — AB (ref 70–99)
Glucose-Capillary: 110 mg/dL — ABNORMAL HIGH (ref 70–99)
Glucose-Capillary: 100 mg/dL — ABNORMAL HIGH (ref 70–99)
Glucose-Capillary: 175 mg/dL — ABNORMAL HIGH (ref 70–99)
Glucose-Capillary: 121 mg/dL — ABNORMAL HIGH (ref 70–99)
Glucose-Capillary: 154 mg/dL — ABNORMAL HIGH (ref 70–99)
Glucose-Capillary: 103 mg/dL — ABNORMAL HIGH (ref 70–99)
Glucose-Capillary: 109 mg/dL — ABNORMAL HIGH (ref 70–99)
Glucose-Capillary: 147 mg/dL — ABNORMAL HIGH (ref 70–99)
Glucose-Capillary: 163 mg/dL — ABNORMAL HIGH (ref 70–99)
Glucose-Capillary: 137 mg/dL — ABNORMAL HIGH (ref 70–99)

## 2013-12-03 LAB — CBC
HCT: 26.4 % — ABNORMAL LOW (ref 39.0–52.0)
Hemoglobin: 9.2 g/dL — ABNORMAL LOW (ref 13.0–17.0)
MCH: 30.8 pg (ref 26.0–34.0)
MCHC: 34.8 g/dL (ref 30.0–36.0)
MCV: 88.3 fL (ref 78.0–100.0)
Platelets: 89 10*3/uL — ABNORMAL LOW (ref 150–400)
RBC: 2.99 MIL/uL — ABNORMAL LOW (ref 4.22–5.81)
RDW: 14.7 % (ref 11.5–15.5)
WBC: 8 10*3/uL (ref 4.0–10.5)

## 2013-12-03 LAB — PROTIME-INR
INR: 1.34 (ref 0.00–1.49)
Prothrombin Time: 16.6 seconds — ABNORMAL HIGH (ref 11.6–15.2)

## 2013-12-03 MED ORDER — WARFARIN SODIUM 5 MG PO TABS
5.0000 mg | ORAL_TABLET | Freq: Every day | ORAL | Status: DC
  Administered 2013-12-03 – 2013-12-04 (×2): 5 mg via ORAL
  Filled 2013-12-03 (×3): qty 1

## 2013-12-03 MED ORDER — SODIUM CHLORIDE 0.9 % IJ SOLN
3.0000 mL | Freq: Two times a day (BID) | INTRAMUSCULAR | Status: DC
  Administered 2013-12-03 – 2013-12-05 (×2): 3 mL via INTRAVENOUS
  Administered 2013-12-05: 12:00:00 via INTRAVENOUS
  Administered 2013-12-06: 3 mL via INTRAVENOUS

## 2013-12-03 MED ORDER — MAGNESIUM HYDROXIDE 400 MG/5ML PO SUSP: 30.0000 mL | Freq: Every day | ORAL | Status: DC | PRN

## 2013-12-03 MED ORDER — TRAMADOL HCL 50 MG PO TABS: 50.0000 mg | ORAL_TABLET | Freq: Four times a day (QID) | ORAL | Status: DC | PRN

## 2013-12-03 MED ORDER — INSULIN ASPART 100 UNIT/ML ~~LOC~~ SOLN
0.0000 [IU] | Freq: Three times a day (TID) | SUBCUTANEOUS | Status: DC
  Administered 2013-12-03: 4 [IU] via SUBCUTANEOUS
  Administered 2013-12-03 – 2013-12-04 (×5): 2 [IU] via SUBCUTANEOUS

## 2013-12-03 MED ORDER — AMIODARONE HCL 200 MG PO TABS
400.0000 mg | ORAL_TABLET | Freq: Two times a day (BID) | ORAL | Status: DC
  Administered 2013-12-03 – 2013-12-05 (×6): 400 mg via ORAL
  Filled 2013-12-03 (×8): qty 2

## 2013-12-03 MED ORDER — ASPIRIN EC 81 MG PO TBEC
81.0000 mg | DELAYED_RELEASE_TABLET | Freq: Every day | ORAL | Status: DC
  Administered 2013-12-03 – 2013-12-07 (×5): 81 mg via ORAL
  Filled 2013-12-03 (×5): qty 1

## 2013-12-03 MED ORDER — SODIUM CHLORIDE 0.9 % IV SOLN
250.0000 mL | INTRAVENOUS | Status: DC | PRN
Start: 2013-12-03 — End: 2013-12-07

## 2013-12-03 MED ORDER — FUROSEMIDE 10 MG/ML IJ SOLN
40.0000 mg | Freq: Every day | INTRAMUSCULAR | Status: DC
  Administered 2013-12-04 – 2013-12-05 (×2): 40 mg via INTRAVENOUS
  Filled 2013-12-03 (×3): qty 4

## 2013-12-03 MED ORDER — MOVING RIGHT ALONG BOOK
Freq: Once | Status: AC
  Administered 2013-12-03: 10:00:00
  Filled 2013-12-03: qty 1

## 2013-12-03 MED ORDER — POTASSIUM CHLORIDE 10 MEQ/50ML IV SOLN
10.0000 meq | INTRAVENOUS | Status: AC
  Administered 2013-12-03 (×2): 10 meq via INTRAVENOUS
  Filled 2013-12-03 (×3): qty 50

## 2013-12-03 MED ORDER — SODIUM CHLORIDE 0.9 % IJ SOLN: 3.0000 mL | INTRAMUSCULAR | Status: DC | PRN

## 2013-12-03 NOTE — Progress Notes (Signed)
CARDIAC REHAB PHASE I   PRE:  Rate/Rhythm: 85 SR    BP: sitting 115/52    SaO2: 92 4L  MODE:  Ambulation: 190 ft   POST:  Rate/Rhythm: 108 ST    BP: sitting 94/41     SaO2: 91-93 4L  Pt frustrated because he hasn't gotten a nap that was promised to him. Assist x2 to get to EOB and walk with RW, 4L O2. Fairly steady walking, practiced pursed lip breathing. HR stable, SaO2 91 on 4L. Return to bed. Will continue to follow. [id number]   Elissa Lovett [place] CES, ACSM 12/03/2013 2:50 PM

## 2013-12-03 NOTE — Progress Notes (Signed)
Transferred to 2W24 with monitor, oxygen 4 L, patient ambulated halfway then rode in the wheelchair.  Patient was in and out of AFIB prior to and during walk, upon laying down in bed on 2W, patient returned to SR.   Meds and belongings sent with patient

## 2013-12-03 NOTE — Progress Notes (Signed)
2 Days Post-Op Procedure(s) (LRB): CORONARY ARTERY BYPASS GRAFT TIMES FOUR USING LEFT INTERNAL MAMMARY ARTERY TO LAD, SAPHENOUS VEIN GRAFTS TO DIAGONAL, CIRCUMFELX AND POSTERIOR DESCENDING (N/A) Subjective: OOB to chair Back in nsr on amioodarone- will transition to po dose Resuming preop coumadin Chest tube 300 cc serous output-- will continue Objective: Vital signs in last 24 hours: Temp:  [97.9 F (36.6 C)-99.7 F (37.6 C)] 98.3 F (36.8 C) (07/08 0749) Pulse Rate:  [67-117] 99 (07/08 0900) Cardiac Rhythm:  [-] Other (Comment);Atrial fibrillation (07/08 0900) Resp:  [0-27] 22 (07/08 0900) BP: (71-144)/(25-85) 104/49 mmHg (07/08 0900) SpO2:  [91 %-100 %] 92 % (07/08 0900) Arterial Line BP: (96-156)/(33-61) 117/45 mmHg (07/07 1600) Weight:  [285 lb 15 oz (129.7 kg)] 285 lb 15 oz (129.7 kg) (07/08 0500)  Hemodynamic parameters for last 24 hours: PAP: (36)/(17) 36/17 mmHg  Intake/Output from previous day: 07/07 0701 - 07/08 0700 In: 2216.3 [P.O.:360; I.V.:1456.3; IV Piggyback:400] Out: 2030 [Urine:1710; Chest Tube:320] Intake/Output this shift: Total I/O In: 73.4 [I.V.:73.4] Out: 250 [Urine:140; Chest Tube:110]  Alert neuro intact Mild edema  Lab Results:  Recent Labs  12/02/13 1649 12/02/13 1653 12/03/13 0432  WBC 14.2*  --  8.0  HGB 10.3* 10.5* 9.2*  HCT 30.3* 31.0* 26.4*  PLT 123*  --  89*   BMET:  Recent Labs  12/02/13 0415  12/02/13 1653 12/03/13 0432  NA 138  --  136* 134*  K 4.0  --  3.9 4.0  CL 102  --  100 97  CO2 22  --   --  23  GLUCOSE 110*  --  161* 143*  BUN 21  --  21 25*  CREATININE 1.22  < > 1.40* 1.49*  CALCIUM 7.8*  --   --  8.1*  < > = values in this interval not displayed.  PT/INR:  Recent Labs  12/03/13 0432  LABPROT 16.6*  INR 1.34   ABG    Component Value Date/Time   PHART 7.361 12/02/2013 0407   HCO3 22.7 12/02/2013 0407   TCO2 20 12/02/2013 1653   ACIDBASEDEF 2.0 12/02/2013 0407   O2SAT 95.0 12/02/2013 0407   CBG (last 3)    Recent Labs  12/02/13 2337 12/03/13 0348 12/03/13 0746  GLUCAP 165* 130* 147*    Assessment/Plan: S/P Procedure(s) (LRB): CORONARY ARTERY BYPASS GRAFT TIMES FOUR USING LEFT INTERNAL MAMMARY ARTERY TO LAD, SAPHENOUS VEIN GRAFTS TO DIAGONAL, CIRCUMFELX AND POSTERIOR DESCENDING (N/A) Transfer to stepdown   LOS: 8 days    VAN TRIGT III,PETER 12/03/2013

## 2013-12-04 ENCOUNTER — Inpatient Hospital Stay (HOSPITAL_COMMUNITY): Payer: Medicare Other

## 2013-12-04 LAB — CBC
HCT: 24.5 % — ABNORMAL LOW (ref 39.0–52.0)
Hemoglobin: 8.4 g/dL — ABNORMAL LOW (ref 13.0–17.0)
MCH: 29.9 pg (ref 26.0–34.0)
MCHC: 34.3 g/dL (ref 30.0–36.0)
MCV: 87.2 fL (ref 78.0–100.0)
Platelets: 98 10*3/uL — ABNORMAL LOW (ref 150–400)
RBC: 2.81 MIL/uL — ABNORMAL LOW (ref 4.22–5.81)
RDW: 14.6 % (ref 11.5–15.5)
WBC: 7.5 10*3/uL (ref 4.0–10.5)

## 2013-12-04 LAB — BASIC METABOLIC PANEL
Anion gap: 12 (ref 5–15)
BUN: 25 mg/dL — ABNORMAL HIGH (ref 6–23)
CO2: 24 mEq/L (ref 19–32)
Calcium: 8.3 mg/dL — ABNORMAL LOW (ref 8.4–10.5)
Chloride: 96 mEq/L (ref 96–112)
Creatinine, Ser: 1.33 mg/dL (ref 0.50–1.35)
GFR calc Af Amer: 60 mL/min — ABNORMAL LOW (ref >90)
GFR calc non Af Amer: 52 mL/min — ABNORMAL LOW (ref >90)
Glucose, Bld: 133 mg/dL — ABNORMAL HIGH (ref 70–99)
Potassium: 4.3 mEq/L (ref 3.7–5.3)
Sodium: 132 mEq/L — ABNORMAL LOW (ref 137–147)

## 2013-12-04 LAB — PROTIME-INR
INR: 1.22 (ref 0.00–1.49)
Prothrombin Time: 15.4 seconds — ABNORMAL HIGH (ref 11.6–15.2)

## 2013-12-04 LAB — GLUCOSE, CAPILLARY
GLUCOSE-CAPILLARY: 128 mg/dL — AB (ref 70–99)
Glucose-Capillary: 128 mg/dL — ABNORMAL HIGH (ref 70–99)
Glucose-Capillary: 150 mg/dL — ABNORMAL HIGH (ref 70–99)
Glucose-Capillary: 132 mg/dL — ABNORMAL HIGH (ref 70–99)

## 2013-12-04 MED ORDER — METOPROLOL TARTRATE 25 MG PO TABS
25.0000 mg | ORAL_TABLET | Freq: Two times a day (BID) | ORAL | Status: DC
  Administered 2013-12-04 – 2013-12-07 (×6): 25 mg via ORAL
  Filled 2013-12-04 (×8): qty 1

## 2013-12-04 MED ORDER — METOLAZONE 5 MG PO TABS
5.0000 mg | ORAL_TABLET | Freq: Every day | ORAL | Status: AC
  Administered 2013-12-04 – 2013-12-05 (×2): 5 mg via ORAL
  Filled 2013-12-04 (×2): qty 1

## 2013-12-04 MED ORDER — METOPROLOL TARTRATE 25 MG/10 ML ORAL SUSPENSION
12.5000 mg | Freq: Two times a day (BID) | ORAL | Status: DC
  Administered 2013-12-04: 12.5 mg
  Filled 2013-12-04 (×4): qty 5

## 2013-12-04 MED ORDER — DIGOXIN 125 MCG PO TABS
0.1250 mg | ORAL_TABLET | Freq: Every day | ORAL | Status: DC
  Administered 2013-12-04 – 2013-12-06 (×3): 0.125 mg via ORAL
  Filled 2013-12-04 (×4): qty 1

## 2013-12-04 MED ORDER — AMIODARONE LOAD VIA INFUSION
150.0000 mg | Freq: Once | INTRAVENOUS | Status: DC
  Filled 2013-12-04 (×3): qty 83.34

## 2013-12-04 MED ORDER — AMIODARONE IV BOLUS ONLY 150 MG/100ML
150.0000 mg | Freq: Once | INTRAVENOUS | Status: AC
  Administered 2013-12-04: 150 mg via INTRAVENOUS
  Filled 2013-12-04: qty 100

## 2013-12-04 NOTE — Progress Notes (Signed)
DC chest tube per MD orders and protocol; after removal of chest tube while tying sutures it was noticed that the suture didn't tie close enough to the skin; site was open; notified PA; PA ordered for steri strips and benzoin; occlusive dressing and 4X4 gauze was placed on top with tape.  Hermina Barters, RN

## 2013-12-04 NOTE — Progress Notes (Signed)
Pt ambulated 350 ft; one assist, front wheel walker, 2L of O2; pt tolerated walk well; pt now up in chair with call bell within reach; will continue to monitor.  Hermina Barters, RN

## 2013-12-04 NOTE — Progress Notes (Addendum)
[address]       [place] [id number]             [phone]      3 Days Post-Op Procedure(s) (LRB): CORONARY ARTERY BYPASS GRAFT TIMES FOUR USING LEFT INTERNAL MAMMARY ARTERY TO LAD, SAPHENOUS VEIN GRAFTS TO DIAGONAL, CIRCUMFELX AND POSTERIOR DESCENDING (N/A) Subjective: Feels ok overall  Objective: Vital signs in last 24 hours: Temp:  [98.2 F (36.8 C)-99.1 F (37.3 C)] 99.1 F (37.3 C) (07/09 0440) Pulse Rate:  [73-118] 91 (07/09 0440) Cardiac Rhythm:  [-] Normal sinus rhythm (07/08 2014) Resp:  [12-24] 20 (07/09 0440) BP: (95-214)/(49-84) 98/49 mmHg (07/09 0440) SpO2:  [90 %-97 %] 93 % (07/09 0440) Weight:  [285 lb 14.4 oz (129.683 kg)] 285 lb 14.4 oz (129.683 kg) (07/09 0440)  Hemodynamic parameters for last 24 hours:    Intake/Output from previous day: 07/08 0701 - 07/09 0700 In: 453.4 [P.O.:240; I.V.:113.4; IV Piggyback:100] Out: 390 [Urine:240; Chest Tube:150] Intake/Output this shift:    General appearance: alert, cooperative and no distress Heart: irregularly irregular rhythm and tachy Lungs: dim in bases Abdomen: + BS, soft, nontender Extremities: + edema Wound: incis healing well  Lab Results:  Recent Labs  12/03/13 0432 12/04/13 0405  WBC 8.0 7.5  HGB 9.2* 8.4*  HCT 26.4* 24.5*  PLT 89* 98*   BMET:  Recent Labs  12/03/13 0432 12/04/13 0405  NA 134* 132*  K 4.0 4.3  CL 97 96  CO2 23 24  GLUCOSE 143* 133*  BUN 25* 25*  CREATININE 1.49* 1.33  CALCIUM 8.1* 8.3*    PT/INR:  Recent Labs  12/04/13 0405  LABPROT 15.4*  INR 1.22   ABG    Component Value Date/Time   PHART 7.361 12/02/2013 0407   HCO3 22.7 12/02/2013 0407   TCO2 20 12/02/2013 1653   ACIDBASEDEF 2.0 12/02/2013 0407   O2SAT 95.0 12/02/2013 0407   CBG (last 3)   Recent Labs  12/03/13 1611 12/03/13 2143 12/04/13 0624  GLUCAP 116* 137* 128*   Scheduled Meds: . acetaminophen  1,000 mg Oral 4 times per day   Or  . acetaminophen (TYLENOL) oral  liquid 160 mg/5 mL  1,000 mg Per Tube 4 times per day  . amiodarone  400 mg Oral BID  . aspirin EC  81 mg Oral Daily  . atorvastatin  20 mg Oral q1800  . bisacodyl  10 mg Oral Daily   Or  . bisacodyl  10 mg Rectal Daily  . Chlorhexidine Gluconate Cloth  6 each Topical Daily  . docusate sodium  200 mg Oral Daily  . fluticasone  2 spray Each Nare Daily  . furosemide  40 mg Intravenous Daily  . insulin aspart  0-24 Units Subcutaneous TID AC & HS  . insulin detemir  14 Units Subcutaneous BID  . loratadine  10 mg Oral Daily  . metoprolol tartrate  12.5 mg Oral BID   Or  . metoprolol tartrate  12.5 mg Per Tube BID  . mupirocin ointment  1 application Nasal BID  . pantoprazole  40 mg Oral Daily  . sodium chloride  3 mL Intravenous Q12H  . warfarin  5 mg Oral q1800  . Warfarin - Physician Dosing Inpatient   Does not apply q1800   Continuous Infusions: . sodium chloride 20 mL/hr at 12/02/13 1900  . sodium chloride 20 mL/hr at 12/02/13 1600  . sodium chloride Stopped (12/02/13 0600)  . lactated ringers  20 mL/hr at 12/02/13 0700   PRN Meds:.sodium chloride, magnesium hydroxide, metoprolol, ondansetron (ZOFRAN) IV, oxyCODONE, sodium chloride, traMADol  Dg Chest Port 1 View  12/03/2013   CLINICAL DATA:  Postop heart surgery  EXAM: PORTABLE CHEST - 1 VIEW  COMPARISON:  12/02/2013  FINDINGS: Right subclavian central venous catheter removed. Right subclavian sheath remains in place with the tip in the right innominate vein.  Left chest tube remains in place.  No pneumothorax.  Hypoventilation with increase in bibasilar atelectasis. Small pleural effusions bilaterally. No edema.  IMPRESSION: Hypoventilation with increase in bibasilar atelectasis.   Electronically Signed   By: Marlan Palau M.D.   On: 12/03/2013 08:13   Assessment/Plan: S/P Procedure(s) (LRB): CORONARY ARTERY BYPASS GRAFT TIMES FOUR USING LEFT INTERNAL MAMMARY ARTERY TO LAD, SAPHENOUS VEIN GRAFTS TO DIAGONAL, CIRCUMFELX AND  POSTERIOR DESCENDING (N/A)  1 afib with RVR, BP has been stable, except one reading systolic 214 which I doubt is accurate- will give a 150 mg IV amio bolus/increase beta blocker dose. Cont coumadin load 2 Tmax 99.1- no leukocytosis 3 H/H slightly lower, monitor . Platelet trend - improving thrombocytopenia 4 creat has improved- not on ACEI yet - hold off for now 5 sugars well controlled- not on meds preop- HGB A1C 6.3, can stop insulin soon 6 cont gentle diuresis 7 CT with 150 cc drainage- poss d/c soon  LOS: 9 days    GOLD,WAYNE E 12/04/2013  DC chest tube and cont IV lasix patient examined and medical record reviewed,agree with above note. VAN TRIGT III,PETER 12/04/2013

## 2013-12-04 NOTE — Progress Notes (Signed)
CARDIAC REHAB PHASE I   PRE:  Rate/Rhythm: 105 afib    BP: sitting 103/63    SaO2: 97 4L, 95 2L  MODE:  Ambulation: 350 ft   POST:  Rate/Rhythm: 131 at max, mostly 110s    BP: sitting 115/51     SaO2: 98 2L   Pt more motivated today. Able to stand from recliner with rocking and x1 assist. Steady with RW. Practiced pursed lip breathing, denied SOB. Weaned down to 2L O2. SaO2 hard to register due to afib and cold fingers. Pt tired after walk. Encouraged x2 more walks and IS 10 times every hr. [id number]  Elissa Lovett [place] CES, ACSM 12/04/2013 11:05 AM    105 afib

## 2013-12-05 LAB — CBC
HCT: 26.5 % — ABNORMAL LOW (ref 39.0–52.0)
HEMOGLOBIN: 8.9 g/dL — AB (ref 13.0–17.0)
MCH: 29.3 pg (ref 26.0–34.0)
MCHC: 33.6 g/dL (ref 30.0–36.0)
MCV: 87.2 fL (ref 78.0–100.0)
PLATELETS: 176 10*3/uL (ref 150–400)
RBC: 3.04 MIL/uL — AB (ref 4.22–5.81)
RDW: 14.4 % (ref 11.5–15.5)
WBC: 9.5 10*3/uL (ref 4.0–10.5)

## 2013-12-05 LAB — GLUCOSE, CAPILLARY
GLUCOSE-CAPILLARY: 138 mg/dL — AB (ref 70–99)
Glucose-Capillary: 115 mg/dL — ABNORMAL HIGH (ref 70–99)
Glucose-Capillary: 133 mg/dL — ABNORMAL HIGH (ref 70–99)

## 2013-12-05 LAB — TYPE AND SCREEN
ABO/RH(D): O POS
Antibody Screen: NEGATIVE
Unit division: 0
Unit division: 0

## 2013-12-05 LAB — BASIC METABOLIC PANEL
ANION GAP: 18 — AB (ref 5–15)
BUN: 31 mg/dL — ABNORMAL HIGH (ref 6–23)
CALCIUM: 8.3 mg/dL — AB (ref 8.4–10.5)
CO2: 22 meq/L (ref 19–32)
Chloride: 94 mEq/L — ABNORMAL LOW (ref 96–112)
Creatinine, Ser: 1.44 mg/dL — ABNORMAL HIGH (ref 0.50–1.35)
GFR calc Af Amer: 55 mL/min — ABNORMAL LOW (ref >90)
GFR, EST NON AFRICAN AMERICAN: 47 mL/min — AB (ref >90)
GLUCOSE: 118 mg/dL — AB (ref 70–99)
POTASSIUM: 3.9 meq/L (ref 3.7–5.3)
Sodium: 134 mEq/L — ABNORMAL LOW (ref 137–147)

## 2013-12-05 LAB — PROTIME-INR
INR: 1.17 (ref 0.00–1.49)
Prothrombin Time: 14.9 seconds (ref 11.6–15.2)

## 2013-12-05 MED ORDER — WARFARIN SODIUM 5 MG PO TABS
5.0000 mg | ORAL_TABLET | Freq: Every day | ORAL | Status: DC
  Administered 2013-12-05: 5 mg via ORAL
  Filled 2013-12-05 (×2): qty 1

## 2013-12-05 MED ORDER — LACTULOSE 10 GM/15ML PO SOLN
20.0000 g | Freq: Once | ORAL | Status: AC
Start: 2013-12-05 — End: 2013-12-05
  Administered 2013-12-05: 20 g via ORAL
  Filled 2013-12-05: qty 30

## 2013-12-05 NOTE — Progress Notes (Signed)
Pt ambulated in hallway approx 334ft. Pt used wheelchair and could benefit from a walker. Monitoring will continue.

## 2013-12-05 NOTE — Discharge Instructions (Signed)
Coronary Artery Bypass Grafting, Care After °Refer to this sheet in the next few weeks. These instructions provide you with information on caring for yourself after your procedure. Your health care provider may also give you more specific instructions. Your treatment has been planned according to current medical practices, but problems sometimes occur. Call your health care provider if you have any problems or questions after your procedure. °WHAT TO EXPECT AFTER THE PROCEDURE °Recovery from surgery will be different for everyone. Some people feel well after 3 or 4 weeks, while for others it takes longer. After your procedure, it is typical to have the following: °· Nausea and a lack of appetite.   °· Constipation. °· Weakness and fatigue.   °· Depression or irritability.   °· Pain or discomfort at your incision site. °HOME CARE INSTRUCTIONS °· Take all medicines as directed by your health care provider. Do not stop taking medicines or start any new medicines without first checking with your health care provider. °· Take your pulse as directed by your health care provider. °· Perform deep breathing as directed by your health care provider. If you were given a device called an incentive spirometer, use it to practice deep breathing several times a day. Support your chest with a pillow or your arms when you take deep breaths or cough. °· Keep incision areas clean, dry, and protected. Remove or change any bandages (dressings) only as directed by your health care provider. You may have skin adhesive strips over the incision areas. Do not take the strips off. They will fall off on their own. °· Check incision areas daily for any swelling, redness, or drainage. °· If incisions were made in your legs, do the following: °¨ Avoid crossing your legs.   °¨ Avoid sitting for long periods of time. Change positions every 30 minutes.   °¨ Elevate your legs when you are sitting. °· Wear compression stockings as directed by your  health care provider. These stockings help keep blood clots from forming in your legs. °· Take showers once your health care provider approves. Until then, only take sponge baths. Pat incisions dry. Do not rub incisions with a washcloth or towel. Do not bathe, swim, or use a hot tub until directed by your health care provider. °· Eat foods that are high in fiber, such as raw fruits and vegetables, whole grains, beans, and nuts. Meats should be lean cut. Avoid canned, processed, and fried foods. °· Drink enough fluids to keep your urine clear or pale yellow. °· Weigh yourself every day. This helps identify if you are retaining fluid that may make your heart and lungs work harder. °· Rest and limit activity as directed by your health care provider. You may be instructed to: °¨ Stop any activity at once if you have chest pain, shortness of breath, irregular heartbeats, or dizziness. Get help right away if you have any of these symptoms. °¨ Move around frequently for short periods or take short walks as directed by your health care provider. Increase your activities gradually. You may need physical therapy or cardiac rehabilitation to help strengthen your muscles and build your endurance. °¨ Avoid lifting, pushing, or pulling anything heavier than 10 lb (4.5 kg) for at least 6 weeks after surgery. °· Do not drive until your health care provider approves.  °· Ask your health care provider when you may return to work. °· Ask your health care provider when you may resume sexual activity. °· Follow up with your health care provider as directed. °SEEK   MEDICAL CARE IF: °· You have swelling, redness, increasing pain, or drainage at the site of an incision. °· You have a fever. °· You have swelling in your ankles or legs. °· You have pain in your legs.   °· You gain 2 or more pounds (0.9 kg) a day. °· You are nauseous or vomit. °· You have diarrhea.  °SEEK IMMEDIATE MEDICAL CARE IF: °· You have chest pain that goes to your jaw  or arms. °· You have shortness of breath.   °· You have a fast or irregular heartbeat.   °· You notice a "clicking" in your breastbone (sternum) when you move.   °· You have numbness or weakness in your arms or legs. °· You feel dizzy or light-headed.   °MAKE SURE YOU: °· Understand these instructions. °· Will watch your condition. °· Will get help right away if you are not doing well or get worse. °Document Released: 12/02/2004 Document Revised: 05/20/2013 Document Reviewed: 10/22/2012 °[employer]® Patient Information ©2015 [employer]. This information is not intended to replace advice given to you by your health care provider. Make sure you discuss any questions you have with your health care provider. ° °Endoscopic Saphenous Vein Harvesting °Care After °Refer to this sheet in the next few weeks. These instructions provide you with information on caring for yourself after your procedure. Your health care provider may also give you more specific instructions. Your treatment has been planned according to current medical practices, but problems sometimes occur. Call your health care provider if you have any problems or questions after your procedure. °HOME CARE INSTRUCTIONS °Medicine °· Take whatever pain medicine your surgeon prescribes. Follow the directions carefully. Do not take over-the-counter pain medicine unless your surgeon says it is okay. Some pain medicine can cause bleeding problems for several weeks after surgery. °· Follow your surgeon's instructions about driving. You will probably not be permitted to drive after heart surgery. °· Take any medicines your surgeon prescribes. Any medicines you took before your heart surgery should be checked with your health care provider before you start taking them again. °Wound care °· If your surgeon has prescribed an elastic bandage or stocking, ask how long you should wear it. °· Check the area around your surgical cuts (incisions) whenever your bandages  (dressings) are changed. Look for any redness or swelling. °· You will need to return to have the stitches (sutures) or staples taken out. Ask your surgeon when to do that. °· Ask your surgeon when you can shower or bathe. °Activity °· Try to keep your legs raised when you are sitting. °· Do any exercises your health care providers have given you. These may include deep breathing exercises, coughing, walking, or other exercises. °SEEK MEDICAL CARE IF: °· You have any questions about your medicines. °· You have more leg pain, especially if your pain medicine stops working. °· New or growing bruises develop on your leg. °· Your leg swells, feels tight, or becomes red. °· You have numbness in your leg. °SEEK IMMEDIATE MEDICAL CARE IF: °· Your pain gets much worse. °· Blood or fluid leaks from any of the incisions. °· Your incisions become warm, swollen, or red. °· You have chest pain. °· You have trouble breathing. °· You have a fever. °· You have more pain near your leg incision. °MAKE SURE YOU: °· Understand these instructions. °· Will watch your condition. °· Will get help right away if you are not doing well or get worse. °Document Released: 01/25/2011 Document Revised: 05/20/2013 Document Reviewed:   01/25/2011 °[employer]® Patient Information ©2015 [employer]. This information is not intended to replace advice given to you by your health care provider. Make sure you discuss any questions you have with your health care provider. ° ° °

## 2013-12-05 NOTE — Progress Notes (Signed)
Patient ambulated independently 150 feet. Tolerated well, HR 140's. Returned to chair with call bell within reach. Will continue to monitor closely. Lajuana Matte, RN

## 2013-12-05 NOTE — Progress Notes (Addendum)
Removed EPW per MD order per hospital policy. Pacing wires intact, slight drainage on left side, VSS. Patient tolerated well. Reminded to remain in bed for 1 hour. Will continue to monitor closely.  Noticed that order was to DC pacing wires 12/06/13, Doree Fudge, PA was notified of error. Lajuana Matte, RN

## 2013-12-05 NOTE — Discharge Summary (Signed)
Physician Discharge Summary  Patient ID: Brandon Klein MRN: [id number] DOB/AGE: 1940/10/20 73 y.o.  Admit date: 11/25/2013 Discharge date: 12/05/2013  Admission Diagnoses:  Patient Active Problem List   Diagnosis Date Noted  . Chronic anticoagulation 11/27/2013  . Hyperglycemia 11/26/2013  . Angina, class II 11/25/2013  . Abnormal nuclear stress test - HIGH RISK - inferoapical ischemia 11/25/2013  . Mild aortic stenosis 10/31/2013  . HTN (hypertension) 10/31/2013  . PAF (paroxysmal atrial fibrillation)   . Dyslipidemia 07/08/2013  . Bradycardia s/p MDT pacemaker   . Syncope   . CAD S/P LAD PCI in '97- progression at cath 11/26/13- for CABG 12/02/13    Discharge Diagnoses:   Patient Active Problem List   Diagnosis Date Noted  . S/P CABG x 4 12/01/2013  . Chronic anticoagulation 11/27/2013  . Hyperglycemia 11/26/2013  . Angina, class II 11/25/2013  . Abnormal nuclear stress test - HIGH RISK - inferoapical ischemia 11/25/2013  . Mild aortic stenosis 10/31/2013  . HTN (hypertension) 10/31/2013  . PAF (paroxysmal atrial fibrillation)   . Dyslipidemia 07/08/2013  . Bradycardia s/p MDT pacemaker   . Syncope   . CAD S/P LAD PCI in '97- progression at cath 11/26/13- for CABG 12/02/13    Discharged Condition: good  History of Present Illness:   Mr. Brandon Klein is a 73 yo obese white male with known history of CAD.  He is S/P PCI performed to the LAD in 1997 and again in 2002 to the left Circumflex.  He also has a history of paroxysmal Atrial Fibrillation and has been taking Coumadin for the last 2 years.  He also has a known history of Diabetes.  The patient originally presented with exertional angina symptoms that were increasing in frequency and severity.  He underwent stress test which was significantly positive with symptomatic chest pain and hypotension.  Due to this is was felt he should undergo cardiac catheterization.  This was done and demonstrated severe 3 vessel CAD and a preserved EF.   It was felt he would best be treated with Coronary Bypass grafting procedure.  He was admitted to the hospital and TCTS consult was obtained.   Hospital Course:   The patient was evaluated by Dr. Donata Clay.  After review of the patient's catheterization he was in agreement that the patient would benefit from Coronary Bypass procedure.  The risks and benefits of the procedure were explained to the patient and he was agreeable to proceed.  He was taken to the operating room on 12/01/2013.  He underwent CABG x 4 utilizing LIMA to LAD, SVG to Diagonal, SVG to Left Circumflex, SVG to PDA.  He also underwent endoscopic saphenous vein harvest of the right and left thigh.  He tolerated the procedure well and was taken to the SICU in stable condition.  The patient was extubated the evening of surgery.  During his stay in the SICU the patient was weaned off all drips as tolerated.  He was restarted on his home regimen of Coumadin.  He had some thrombocytopenia which was monitored closely.  His chest tubes and arterial lines were removed without difficulty.  He developed Atrial Fibrillation which was treated with IV Amiodarone with successful conversion to NSR.  He was felt to be medically stable for transfer to the step down unit.  The patient remains in Atrial Fibrillation.  This is chronic for him and he is on coumadin with his trending up.  His creatinine is slowly trending up and it will be  monitored closely.  The patient's pacing wires have been removed without difficulty.  He is ambulating without difficulty.  He is tolerating a heart healthy diet.  He is felt to be medically stable for discharge on 12/07/2013.   Significant Diagnostic Studies: angiography:   HEMODYNAMICS: No performed due to inability to cross LV with pigtail catheter   ANGIOGRAPHIC DATA: The left main coronary artery is widely patent and trifurcates into an LAD, left circumflex and ramus arteries.  The left anterior descending artery has an 80%  stenosis in the proximal portion before the LAD stent. There is a 90% instent restenosis within the body of the LAD stent. It then gives rise to a large diagonal branch that has a 90% stenosis in the mid portion.  The Ramus is a moderate sized vessel that is patent and distally bifurcates into 2 daughter vessels.  The left circumflex artery is a large vessel. Mid way down it gives rise to a very large OM1. It gives rise to a superior daughter branch which is patent. In the mid portion of the OM there is an 80-90% stenosis . Distally it gives rise to 2 more branches which are patent.  The right coronary artery is widely patent in the proximal and mid vessel and gives rise to a moderate sized acute RV marginal branch which is patent. Distally the RCA has a 70% stenosis before giving rise to a PDA and PL branches..   Treatments: surgery:   1. Coronary artery bypass grafting x4 (left internal mammary artery to  left anterior descending artery, saphenous vein graft to diagonal,  saphenous vein graft to obtuse marginal, saphenous vein graft to  posterior descending).  2. Endoscopic harvest of right and left leg greater saphenous vein.   Disposition: 01-Home or Self Care  Discharge medications:    Medication List    STOP taking these medications       amLODipine 10 MG tablet  Commonly known as:  NORVASC     metoprolol succinate 50 MG 24 hr tablet  Commonly known as:  TOPROL-XL     nitroGLYCERIN 0.4 MG SL tablet  Commonly known as:  NITROSTAT     trandolapril 4 MG tablet  Commonly known as:  MAVIK     triamterene-hydrochlorothiazide 75-50 MG per tablet  Commonly known as:  MAXZIDE      TAKE these medications       amiodarone 200 MG tablet  Commonly known as:  PACERONE  Take 1 tablet (200 mg total) by mouth 2 (two) times daily. For 7 days, Then decrease to 200 mg daily     ANTIVERT 25 MG tablet  Generic drug:  meclizine  Take 25 mg by mouth 3 (three) times daily as needed for  dizziness.     aspirin 81 MG EC tablet  Take 1 tablet (81 mg total) by mouth daily.     atorvastatin 20 MG tablet  Commonly known as:  LIPITOR  Take 20 mg by mouth daily.     fexofenadine 180 MG tablet  Commonly known as:  ALLEGRA  Take 180 mg by mouth daily.     Fish Oil 500 MG Caps  Take 1,000 mg by mouth 2 (two) times daily.     FLONASE 50 MCG/ACT nasal spray  Generic drug:  fluticasone  Place 2 sprays into the nose daily.     furosemide 40 MG tablet  Commonly known as:  LASIX  Take 1 tablet (40 mg total) by mouth daily. For  7 Days     GLUCOSAMINE CHONDR 1500 COMPLX Caps  Take 1 capsule by mouth daily.     GNP VITAMIN D 400 UNITS Tabs tablet  Generic drug:  cholecalciferol  Take 400 Units by mouth daily.     L-Lysine 500 MG Caps  Take 1 capsule by mouth daily.     metoprolol tartrate 25 MG tablet  Commonly known as:  LOPRESSOR  Take 1 tablet (25 mg total) by mouth 2 (two) times daily.     multivitamin tablet  Take 1 tablet by mouth daily.     oxyCODONE 5 MG immediate release tablet  Commonly known as:  Oxy IR/ROXICODONE  Take 1-2 tablets (5-10 mg total) by mouth every 3 (three) hours as needed for moderate pain.     pantoprazole 40 MG tablet  Commonly known as:  PROTONIX  Take 40 mg by mouth daily.     potassium chloride SA 20 MEQ tablet  Commonly known as:  K-DUR,KLOR-CON  Take 20 mEq by mouth daily.     warfarin 5 MG tablet  Commonly known as:  COUMADIN  Take 5 mg by mouth as directed.         The patient has been discharged on:   1.Beta Blocker:  Yes [x   ]                              No   [   ]                              If No, reason:  2.Ace Inhibitor/ARB: Yes [   ]                                     No  [ x   ]                                     If No, reason: Elevated creatinine, borderline blood pressure  3.Statin:   Yes [ x  ]                  No  [   ]                  If No, reason:  4.Ecasa:  Yes  [ x  ]                   No   [   ]                  If No, reason:       Follow-up Information   Follow up with VAN Dinah BeersRIGT III,PETER, MD On 01/07/2014. (Appointment is at 10:00)    Specialty:  Cardiothoracic Surgery   Contact information:   [address] [place] [address] [phone]       Follow up with [facility] On 01/07/2014. (Please get CXR at 9:00)    Specialty:  Diagnostic Radiology   Contact information:   [address] [place] [address] [phone]       Signed: Lowella DandyBARRETT, Latrecia Capito 12/05/2013, 10:52 AM

## 2013-12-05 NOTE — Progress Notes (Signed)
Patient ambulated independently 250 feet. Heart rate 130's-140. Patient tolerated fair, paused for 1 rest due to being slightly short of breath. Returned to the chair. Will continue to monitor. Call bell within reach.   Valinda Hoar RN

## 2013-12-05 NOTE — Progress Notes (Addendum)
      [address]       [place] [address]             [phone]        4 Days Post-Op Procedure(s) (LRB): CORONARY ARTERY BYPASS GRAFT TIMES FOUR USING LEFT INTERNAL MAMMARY ARTERY TO LAD, SAPHENOUS VEIN GRAFTS TO DIAGONAL, CIRCUMFELX AND POSTERIOR DESCENDING (N/A)  Subjective: Patient passing flatus but no bowel movement yet  Objective: Vital signs in last 24 hours: Temp:  [98.3 F (36.8 C)-98.6 F (37 C)] 98.3 F (36.8 C) (07/10 0414) Pulse Rate:  [72-95] 72 (07/10 0414) Cardiac Rhythm:  [-] Atrial fibrillation (07/09 2200) Resp:  [20] 20 (07/10 0414) BP: (102-109)/(53-59) 109/54 mmHg (07/10 0414) SpO2:  [94 %-96 %] 96 % (07/10 0414) Weight:  [286 lb 1.6 oz (129.774 kg)] 286 lb 1.6 oz (129.774 kg) (07/10 0310)  Pre op weight 123 kg Current Weight  12/05/13 286 lb 1.6 oz (129.774 kg)      Intake/Output from previous day: 07/09 0701 - 07/10 0700 In: 600 [P.O.:600] Out: -    Physical Exam:  Cardiovascular: [facility] Pulmonary: Diminished at bases bilaterally; no rales, wheezes, or rhonchi. Abdomen: Soft, non tender, bowel sounds present. Extremities: Bilateral lower extremity edema. Wounds: Clean and dry.  No erythema or signs of infection.  Lab Results: CBC: Recent Labs  12/04/13 0405 12/05/13 0643  WBC 7.5 9.5  HGB 8.4* 8.9*  HCT 24.5* 26.5*  PLT 98* 176   BMET:  Recent Labs  12/03/13 0432 12/04/13 0405  NA 134* 132*  K 4.0 4.3  CL 97 96  CO2 23 24  GLUCOSE 143* 133*  BUN 25* 25*  CREATININE 1.49* 1.33  CALCIUM 8.1* 8.3*    PT/INR:  Lab Results  Component Value Date   INR 1.22 12/04/2013   INR 1.34 12/03/2013   INR 1.19 12/02/2013   ABG:  INR: Will add last result for INR, ABG once components are confirmed Will add last 4 CBG results once components are confirmed  Assessment/Plan:  1. CV - A fib with RVR. A fib with HR in the 100's this am. On Amiodarone 400 bid, Digoxin 0.125 daily, Lopressor 25 bid, and  Coumadin. INR slightly decreased form 1.22 to 1.17. QT interval above 500. Likely need to decrease Amiodarone. 2.  Pulmonary - On 2 liters of oxygen via [place]. Wean as tolerates.Encourage incentive spirometer 3. Volume Overload - On Lasix 40 IV daily and Zaroxolyn 4.  Acute blood loss anemia - H and H stable at 8.9 and 26.5 5. Thrombocytopenia resolved-platelets up to 176,000 6. CBGs  128/132/115. Pre op HGA1C 6.3 Likely pre diabetic. Stop accu checks and SS. Will need further surveillance as an outpatient. 7. Remove EPW in am 8. Creatinine slightly increased from 1.33 to 1.44. Continue with Lasix but stop Zaroxolyn for now. Monitor Cr  ZIMMERMAN,DONIELLE MPA-C 12/05/2013,7:52 AM  patient examined and medical record reviewed,agree with above note.  He will need therapeutic INR/coumadin for rate controlled Afib which can be cardioverted later as outoatient VAN TRIGT III,PETER 12/05/2013

## 2013-12-05 NOTE — Progress Notes (Signed)
CARDIAC REHAB PHASE I   PRE:  Rate/Rhythm: 100 afib with PVCs    BP: sitting 104/70    SaO2: 95 2L, 93 RA  MODE:  Ambulation: 350 ft   POST:  Rate/Rhythm: 142 afib    BP: sitting 99/79     SaO2: 96 RA  Pt able to stand independently. To BR then walked partly with RW, partly without it. Steady, pt having trouble deciding if he wants a RW. Will decide later. SaO2 96 RA with ambulation, left O2 off. However HR increased today, up to low 140s on several occasions. Pt DOE. Will f/u for education when wife is here. [id number]  Elissa Lovett Gilmore CES, ACSM 12/05/2013 10:32 AM

## 2013-12-06 LAB — PROTIME-INR
INR: 1.29 (ref 0.00–1.49)
Prothrombin Time: 16.1 seconds — ABNORMAL HIGH (ref 11.6–15.2)

## 2013-12-06 LAB — BASIC METABOLIC PANEL
ANION GAP: 16 — AB (ref 5–15)
BUN: 38 mg/dL — AB (ref 6–23)
CALCIUM: 8.5 mg/dL (ref 8.4–10.5)
CO2: 25 mEq/L (ref 19–32)
CREATININE: 1.8 mg/dL — AB (ref 0.50–1.35)
Chloride: 91 mEq/L — ABNORMAL LOW (ref 96–112)
GFR calc Af Amer: 42 mL/min — ABNORMAL LOW (ref >90)
GFR, EST NON AFRICAN AMERICAN: 36 mL/min — AB (ref >90)
Glucose, Bld: 146 mg/dL — ABNORMAL HIGH (ref 70–99)
Potassium: 3.8 mEq/L (ref 3.7–5.3)
Sodium: 132 mEq/L — ABNORMAL LOW (ref 137–147)

## 2013-12-06 MED ORDER — FUROSEMIDE 40 MG PO TABS
40.0000 mg | ORAL_TABLET | Freq: Every day | ORAL | Status: DC
  Administered 2013-12-06 – 2013-12-07 (×2): 40 mg via ORAL
  Filled 2013-12-06 (×2): qty 1

## 2013-12-06 MED ORDER — WARFARIN SODIUM 7.5 MG PO TABS
7.5000 mg | ORAL_TABLET | Freq: Every day | ORAL | Status: DC
  Administered 2013-12-06: 7.5 mg via ORAL
  Filled 2013-12-06 (×2): qty 1

## 2013-12-06 MED ORDER — LACTULOSE 10 GM/15ML PO SOLN
20.0000 g | Freq: Every day | ORAL | Status: DC | PRN
  Administered 2013-12-06: 20 g via ORAL
  Filled 2013-12-06: qty 30

## 2013-12-06 MED ORDER — AMIODARONE HCL 200 MG PO TABS
200.0000 mg | ORAL_TABLET | Freq: Two times a day (BID) | ORAL | Status: DC
  Administered 2013-12-06 – 2013-12-07 (×3): 200 mg via ORAL
  Filled 2013-12-06 (×4): qty 1

## 2013-12-06 MED ORDER — LACTULOSE 10 GM/15ML PO SOLN
30.0000 g | Freq: Every day | ORAL | Status: DC | PRN
  Administered 2013-12-06: 30 g via ORAL
  Filled 2013-12-06 (×2): qty 45

## 2013-12-06 NOTE — Progress Notes (Signed)
Patient ambulated in the hallway 300 feet. Patient tolerated well with two stops to rest. Patient returned to chair with call bell within reach. Lajuana Matte, RN

## 2013-12-06 NOTE — Progress Notes (Addendum)
      [address]       [place] [address]             [phone]      5 Days Post-Op Procedure(s) (LRB): CORONARY ARTERY BYPASS GRAFT TIMES FOUR USING LEFT INTERNAL MAMMARY ARTERY TO LAD, SAPHENOUS VEIN GRAFTS TO DIAGONAL, CIRCUMFELX AND POSTERIOR DESCENDING (N/A)  Subjective:  Patient states doing okay.  However he would be doing much better if he was able to sleep.  He tried the recliner last night, however it is broken and he ended up 100% flat on his back and then couldn't get back up.   + ambulation  Objective: Vital signs in last 24 hours: Temp:  [98.2 F (36.8 C)-98.8 F (37.1 C)] 98.2 F (36.8 C) (07/11 0524) Pulse Rate:  [86-108] 90 (07/11 0524) Cardiac Rhythm:  [-] Atrial fibrillation (07/10 2008) Resp:  [18-22] 18 (07/11 0524) BP: (87-121)/(46-77) 113/60 mmHg (07/11 0524) SpO2:  [89 %-92 %] 89 % (07/11 0524) Weight:  [286 lb (129.729 kg)] 286 lb (129.729 kg) (07/11 0524)  Intake/Output from previous day: 07/10 0701 - 07/11 0700 In: 840 [P.O.:840] Out: -   General appearance: alert, cooperative and no distress Heart: irregularly irregular rhythm Lungs: clear to auscultation bilaterally Abdomen: soft, non-tender; bowel sounds normal; no masses,  no organomegaly Extremities: edema 1+ Wound: clean and dry  Lab Results:  Recent Labs  12/04/13 0405 12/05/13 0643  WBC 7.5 9.5  HGB 8.4* 8.9*  HCT 24.5* 26.5*  PLT 98* 176   BMET:  Recent Labs  12/05/13 0643 12/06/13 0430  NA 134* 132*  K 3.9 3.8  CL 94* 91*  CO2 22 25  GLUCOSE 118* 146*  BUN 31* 38*  CREATININE 1.44* 1.80*  CALCIUM 8.3* 8.5    PT/INR:  Recent Labs  12/06/13 0430  LABPROT 16.1*  INR 1.29   ABG    Component Value Date/Time   PHART 7.361 12/02/2013 0407   HCO3 22.7 12/02/2013 0407   TCO2 20 12/02/2013 1653   ACIDBASEDEF 2.0 12/02/2013 0407   O2SAT 95.0 12/02/2013 0407   CBG (last 3)   Recent Labs  12/05/13 0635 12/05/13 1127 12/05/13 1609  GLUCAP 115*  133* 138*    Assessment/Plan: S/P Procedure(s) (LRB): CORONARY ARTERY BYPASS GRAFT TIMES FOUR USING LEFT INTERNAL MAMMARY ARTERY TO LAD, SAPHENOUS VEIN GRAFTS TO DIAGONAL, CIRCUMFELX AND POSTERIOR DESCENDING (N/A)  1. CV- remains in A. Fib, rate controlled in the 90s- continue Amiodarone dose reduced to 200 mg BID, Digoxin, Lopressor 2. INR 1.29 small response after 3 doses of 5 mg, will increase dose to 7.5 mg daily 3. Pulm- weaning oxygen as tolerated, encouraged use of IS 4. Renal- creatinine trending up, will stop IV diuretic, remains hypervolemic, weight remains stable and elevated despite good U/O with Lasix, Zaroxyln, will give PO lasix today, repeat BMET in AM 5. Dispo- patient slowly progressing, however creatinine now trending up will monitor, INR not yet therapeutic   LOS: 11 days    BARRETT, ERIN 12/06/2013  patient examined and medical record reviewed,agree with above note. Plan to discharge tomorrow Will need INR 3-4 days post discharge VAN TRIGT III,Tawn Fitzner 12/06/2013

## 2013-12-06 NOTE — Progress Notes (Signed)
CARDIAC REHAB PHASE I   PRE:  Rate/Rhythm: 89 Afib w/ PVCs  BP:  Supine: 112/58 Sitting:   Standing:    SaO2: 91% RA  MODE:  Ambulation: 400 ft   POST:  Rate/Rhythm: 119 Afib w/ PVCs  BP:  Supine:   Sitting: 108/47  Standing:    SaO2: 96% RA  [id number] Pt tolerated ambulation fair, with assist x1. Gait slow, steady, DOE, VSS. Encouraged to try to increase distance as tolerated. To bed after walk.   Artist Pais, MS, ACSM CCEP

## 2013-12-07 LAB — BASIC METABOLIC PANEL
Anion gap: 17 — ABNORMAL HIGH (ref 5–15)
BUN: 37 mg/dL — AB (ref 6–23)
CALCIUM: 8.3 mg/dL — AB (ref 8.4–10.5)
CO2: 25 meq/L (ref 19–32)
Chloride: 90 mEq/L — ABNORMAL LOW (ref 96–112)
Creatinine, Ser: 1.53 mg/dL — ABNORMAL HIGH (ref 0.50–1.35)
GFR calc Af Amer: 51 mL/min — ABNORMAL LOW (ref >90)
GFR, EST NON AFRICAN AMERICAN: 44 mL/min — AB (ref >90)
Glucose, Bld: 147 mg/dL — ABNORMAL HIGH (ref 70–99)
Potassium: 3.2 mEq/L — ABNORMAL LOW (ref 3.7–5.3)
Sodium: 132 mEq/L — ABNORMAL LOW (ref 137–147)

## 2013-12-07 LAB — PROTIME-INR
INR: 1.4 (ref 0.00–1.49)
Prothrombin Time: 17.2 seconds — ABNORMAL HIGH (ref 11.6–15.2)

## 2013-12-07 MED ORDER — OXYCODONE HCL 5 MG PO TABS: 5.0000 mg | ORAL_TABLET | ORAL | Status: DC | PRN

## 2013-12-07 MED ORDER — METOPROLOL TARTRATE 25 MG PO TABS: 25.0000 mg | ORAL_TABLET | Freq: Two times a day (BID) | ORAL | Status: DC

## 2013-12-07 MED ORDER — ASPIRIN 81 MG PO TBEC: 81.0000 mg | DELAYED_RELEASE_TABLET | Freq: Every day | ORAL | Status: DC

## 2013-12-07 MED ORDER — AMIODARONE HCL 200 MG PO TABS: 200.0000 mg | ORAL_TABLET | Freq: Two times a day (BID) | ORAL | Status: DC

## 2013-12-07 MED ORDER — FUROSEMIDE 40 MG PO TABS: 40.0000 mg | ORAL_TABLET | Freq: Every day | ORAL | Status: DC

## 2013-12-07 NOTE — Progress Notes (Addendum)
      [address]       [place] [address]             [phone]      6 Days Post-Op Procedure(s) (LRB): CORONARY ARTERY BYPASS GRAFT TIMES FOUR USING LEFT INTERNAL MAMMARY ARTERY TO LAD, SAPHENOUS VEIN GRAFTS TO DIAGONAL, CIRCUMFELX AND POSTERIOR DESCENDING (N/A)  Subjective:  Brandon Klein states he is doing okay this morning.  He again states he did not sleep much last night and he can not stay here another day.  He has moved his bowels and is ready to be discharged home today.  Objective: Vital signs in last 24 hours: Temp:  [97.1 F (36.2 C)-98.8 F (37.1 C)] 98.2 F (36.8 C) (07/12 0622) Pulse Rate:  [58-94] 58 (07/12 0622) Cardiac Rhythm:  [-] Atrial fibrillation (07/11 2043) Resp:  [18-20] 20 (07/12 0622) BP: (93-111)/(49-58) 105/54 mmHg (07/12 0622) SpO2:  [92 %-96 %] 92 % (07/12 0622) Weight:  [283 lb 15.2 oz (128.8 kg)] 283 lb 15.2 oz (128.8 kg) (07/12 0622)  Intake/Output from previous day: 07/11 0701 - 07/12 0700 In: 480 [P.O.:480] Out: 1 [Stool:1]  General appearance: alert, cooperative and no distress Heart: irregularly irregular rhythm Lungs: clear to auscultation bilaterally Abdomen: soft, non-tender; bowel sounds normal; no masses,  no organomegaly Extremities: edema trace Wound: clean and dry  Lab Results:  Recent Labs  12/05/13 0643  WBC 9.5  HGB 8.9*  HCT 26.5*  PLT 176   BMET:  Recent Labs  12/06/13 0430 12/07/13 0420  NA 132* 132*  K 3.8 3.2*  CL 91* 90*  CO2 25 25  GLUCOSE 146* 147*  BUN 38* 37*  CREATININE 1.80* 1.53*  CALCIUM 8.5 8.3*    PT/INR:  Recent Labs  12/07/13 0420  LABPROT 17.2*  INR 1.40   ABG    Component Value Date/Time   PHART 7.361 12/02/2013 0407   HCO3 22.7 12/02/2013 0407   TCO2 20 12/02/2013 1653   ACIDBASEDEF 2.0 12/02/2013 0407   O2SAT 95.0 12/02/2013 0407   CBG (last 3)   Recent Labs  12/05/13 0635 12/05/13 1127 12/05/13 1609  GLUCAP 115* 133* 138*    Assessment/Plan: S/P  Procedure(s) (LRB): CORONARY ARTERY BYPASS GRAFT TIMES FOUR USING LEFT INTERNAL MAMMARY ARTERY TO LAD, SAPHENOUS VEIN GRAFTS TO DIAGONAL, CIRCUMFELX AND POSTERIOR DESCENDING (N/A)  1. CV- remains in A. Fib, rate controlled- continue Amiodarone, Lopressor, Digoxin 2. INR 1.40, trending up will resume patient's home regimen at discharge 3. Pulm- off oxygen, encouraged continued use of IS 4. Renal creatinine stable, decreased from yesterday, remains hypervolemic will continue Lasix 5. Dispo- patient stable, INR trending up will resume home regimen, patient adamant about being discharged home today, will d/c with plans for PT.INR check on Tuesday   LOS: 12 days    Klein, Brandon 12/07/2013  patient examined and medical record reviewed,agree with above note. VAN TRIGT Klein,Brandon 12/07/2013

## 2013-12-07 NOTE — Progress Notes (Addendum)
Removed CT sutures per MD order per hospital policy. Applied benzoin and steri strips to site. Slight drainage to left side. Removed central line to patient right chest per MD order per hospital policy. Patient tolerated well. Applied Vaseline/gauze and hyperfix tape to site. Advised patient to remain in bed for 30 minutes. Will continue to monitor closely. Lajuana Matte, RN

## 2013-12-09 ENCOUNTER — Ambulatory Visit (INDEPENDENT_AMBULATORY_CARE_PROVIDER_SITE_OTHER): Payer: Medicare Other | Admitting: *Deleted

## 2013-12-09 DIAGNOSIS — I4891 Unspecified atrial fibrillation: Secondary | ICD-10-CM

## 2013-12-09 LAB — POCT INR: INR: 2.1

## 2013-12-16 ENCOUNTER — Ambulatory Visit (INDEPENDENT_AMBULATORY_CARE_PROVIDER_SITE_OTHER): Payer: Medicare Other | Admitting: *Deleted

## 2013-12-16 DIAGNOSIS — I4891 Unspecified atrial fibrillation: Secondary | ICD-10-CM

## 2013-12-16 LAB — POCT INR: INR: 2.6

## 2013-12-19 ENCOUNTER — Other Ambulatory Visit: Payer: Self-pay

## 2013-12-19 DIAGNOSIS — R600 Localized edema: Secondary | ICD-10-CM

## 2013-12-19 MED ORDER — FUROSEMIDE 40 MG PO TABS: 40.0000 mg | ORAL_TABLET | Freq: Every day | ORAL | Status: DC

## 2013-12-19 NOTE — Telephone Encounter (Signed)
Patient called c/o edema in extremities with some weight gain. X 3 days. He finished the lasix RX 3 days ago. Will send refill to pharm. He was instructed to call back if he develops breathing issues or swelling and weight gain continue.

## 2013-12-22 DIAGNOSIS — I2510 Atherosclerotic heart disease of native coronary artery without angina pectoris: Secondary | ICD-10-CM

## 2013-12-22 DIAGNOSIS — Z48812 Encounter for surgical aftercare following surgery on the circulatory system: Secondary | ICD-10-CM

## 2013-12-23 NOTE — Discharge Summary (Signed)
Medical record reviewed, agree with above note.  Kerin Perna M.D. [facility]

## 2013-12-30 ENCOUNTER — Ambulatory Visit (INDEPENDENT_AMBULATORY_CARE_PROVIDER_SITE_OTHER): Payer: Medicare Other | Admitting: Physician Assistant

## 2013-12-30 ENCOUNTER — Ambulatory Visit
Admission: RE | Admit: 2013-12-30 | Discharge: 2013-12-30 | Disposition: A | Discharge status: Home / Self Care | Payer: Medicare Other | Source: Ambulatory Visit | Attending: Physician Assistant | Admitting: Physician Assistant

## 2013-12-30 ENCOUNTER — Ambulatory Visit (INDEPENDENT_AMBULATORY_CARE_PROVIDER_SITE_OTHER): Payer: Medicare Other | Admitting: *Deleted

## 2013-12-30 ENCOUNTER — Encounter: Payer: Self-pay | Admitting: Physician Assistant

## 2013-12-30 VITALS — BP 160/90 | HR 83 | Ht 67.0 in | Wt 276.8 lb

## 2013-12-30 DIAGNOSIS — R0602 Shortness of breath: Secondary | ICD-10-CM

## 2013-12-30 DIAGNOSIS — Z9861 Coronary angioplasty status: Secondary | ICD-10-CM

## 2013-12-30 DIAGNOSIS — I2510 Atherosclerotic heart disease of native coronary artery without angina pectoris: Secondary | ICD-10-CM

## 2013-12-30 DIAGNOSIS — I480 Paroxysmal atrial fibrillation: Secondary | ICD-10-CM

## 2013-12-30 DIAGNOSIS — I10 Essential (primary) hypertension: Secondary | ICD-10-CM

## 2013-12-30 DIAGNOSIS — I359 Nonrheumatic aortic valve disorder, unspecified: Secondary | ICD-10-CM

## 2013-12-30 DIAGNOSIS — R600 Localized edema: Secondary | ICD-10-CM

## 2013-12-30 DIAGNOSIS — I4891 Unspecified atrial fibrillation: Secondary | ICD-10-CM

## 2013-12-30 DIAGNOSIS — E785 Hyperlipidemia, unspecified: Secondary | ICD-10-CM

## 2013-12-30 DIAGNOSIS — I498 Other specified cardiac arrhythmias: Secondary | ICD-10-CM

## 2013-12-30 DIAGNOSIS — R609 Edema, unspecified: Secondary | ICD-10-CM

## 2013-12-30 DIAGNOSIS — I350 Nonrheumatic aortic (valve) stenosis: Secondary | ICD-10-CM

## 2013-12-30 DIAGNOSIS — R001 Bradycardia, unspecified: Secondary | ICD-10-CM

## 2013-12-30 LAB — POCT INR: INR: 2.9

## 2013-12-30 MED ORDER — POTASSIUM CHLORIDE CRYS ER 20 MEQ PO TBCR: 20.0000 meq | EXTENDED_RELEASE_TABLET | Freq: Every day | ORAL | Status: DC

## 2013-12-30 MED ORDER — FUROSEMIDE 40 MG PO TABS: 40.0000 mg | ORAL_TABLET | Freq: Every day | ORAL | Status: DC

## 2013-12-30 NOTE — Patient Instructions (Signed)
RE-START LASIX 40 MG DAILY RE-START POTASSIUM 20 MEQ DAILY  LAB WORK TODAY; BMET  REPEAT BMET 1 WEEK  A chest x-ray takes a picture of the organs and structures inside the chest, including the heart, lungs, and blood vessels. This test can show several things, including, whether the heart is enlarges; whether fluid is building up in the lungs; and whether pacemaker / defibrillator leads are still in place.  PER [facility], PAC CHECK BLOOD PRESSURE TOMORROW AND THEN AGAIN ON Thursday 8/6 AND CALL THE OFFICE WITH READINGS [phone]  Your physician recommends that you schedule a follow-up appointment in: 3-4 WEEKS WITH DR. [name]

## 2013-12-30 NOTE — Progress Notes (Signed)
Cardiology Office Note    Date:  12/30/2013   ID:  Brandon Klein, DOB 08/15/1940, MRN [id number]  PCP:  Gaye Alken, MD  Cardiologist:  Dr. Armanda Magic   Electrophysiologist:  Dr. Sherryl Manges    History of Present Illness: Brandon Klein is a 73 y.o. male with a hx of CAD, status post prior stenting to the LAD and balloon angioplasty to the circumflex, paroxysmal atrial fibrillation, bradycardia s/p PPM, aortic stenosis, HTN, HL.  He was recently seen with exertional chest discomfort and set up for stress testing. This returned high risk and he was set up for cardiac catheterization. This demonstrated 3 vessel CAD and he was referred for CABG. Patient was admitted 6/30-7/10. He underwent CABG with Dr. Donata Clay (LIMA-LAD, SVG-diagonal, SVG-circumflex, SVG-PDA). He developed postoperative atrial fibrillation and was placed on amiodarone.  Patient had volume overload and was sent home on Lasix. His weight came down nicely. However, after stopping this, his weight started to increase again. He was given another week of Lasix. The patient continues to feel as though he cannot take a deep breath. He does feel out of breath with certain activities. He is probably NYHA 2b.  He feels somewhat uncomfortable with lying down in regards to his breathing. He denies true PND. LE edema is improved. His chest is not really all that sore. He does note a nonproductive cough. He denies syncope.   Studies:  - LHC (6/15):  Proximal LAD 80%, LAD stent 90% ISR, mid Dx 90%, mid OM 80-90%, distal RCA 70% >>> CABG  - Echo (6/15):  EF 55-60%, grade 1 diastolic dysfunction, mild aortic stenosis (mean 17 mm Hg), moderate LAE  - Nuclear (6/15):  Small apical infarct with moderate ischemia in the septum, apex and inferior walls, EF 49%; high risk  - Carotid US (7/15):  Bilateral 1-39%   Recent Labs/Images: 07/14/2013: Direct LDL 47.9; HDL Cholesterol by NMR 36.10*  11/19/2013: ALT 30  11/27/2013: TSH  1.350  12/05/2013: Hemoglobin 8.9*  12/07/2013: Creatinine 1.53*; Potassium 3.2*   No results found.   Wt Readings from Last 3 Encounters:  12/30/13 276 lb 12.8 oz (125.556 kg)  12/07/13 283 lb 15.2 oz (128.8 kg)  12/07/13 283 lb 15.2 oz (128.8 kg)     Past Medical History  Diagnosis Date  . Syncope     Vasovagal With documented bradycardia  . Pacemaker   . Obesity   . HTN (hypertension)   . HLD (hyperlipidemia)   . DJD (degenerative joint disease)   . GERD (gastroesophageal reflux disease)   . Impaired fasting glucose     A1C check every year  . PAF (paroxysmal atrial fibrillation)     on chronic systemic anticoagulation  . Coronary artery disease     s/p PCI of LAD 1997 and cutting balloon angioplasty to the left circ in 2002  . Dysrhythmia     hx of atrial fibrilation  . Heart murmur     Current Outpatient Prescriptions  Medication Sig Dispense Refill  . amiodarone (PACERONE) 200 MG tablet Take 1 tablet (200 mg total) by mouth 2 (two) times daily. For 7 days, Then decrease to 200 mg daily  60 tablet  1  . aspirin EC 81 MG EC tablet Take 1 tablet (81 mg total) by mouth daily.      Marland Kitchen atorvastatin (LIPITOR) 20 MG tablet Take 20 mg by mouth daily.        . cholecalciferol (GNP VITAMIN D) 400  UNITS TABS Take 400 Units by mouth daily.        . fexofenadine (ALLEGRA) 180 MG tablet Take 180 mg by mouth daily.        . fluticasone (FLONASE) 50 MCG/ACT nasal spray Place 2 sprays into the nose daily.        . furosemide (LASIX) 40 MG tablet Take 1 tablet (40 mg total) by mouth daily. For 7 Days  7 tablet  0  . Glucosamine-Chondroit-Vit C-Mn (GLUCOSAMINE CHONDR 1500 COMPLX) CAPS Take 1 capsule by mouth daily.       Marland Kitchen. L-Lysine 500 MG CAPS Take 1 capsule by mouth daily.        . meclizine (ANTIVERT) 25 MG tablet Take 25 mg by mouth 3 (three) times daily as needed for dizziness.       . metoprolol tartrate (LOPRESSOR) 25 MG tablet Take 1 tablet (25 mg total) by mouth 2 (two) times  daily.  60 tablet  3  . Multiple Vitamin (MULTIVITAMIN) tablet Take 1 tablet by mouth daily.        . Omega-3 Fatty Acids (FISH OIL) 500 MG CAPS Take 1,000 mg by mouth 2 (two) times daily.      Marland Kitchen. oxyCODONE (OXY IR/ROXICODONE) 5 MG immediate release tablet Take 1-2 tablets (5-10 mg total) by mouth every 3 (three) hours as needed for moderate pain.  30 tablet  0  . pantoprazole (PROTONIX) 40 MG tablet Take 40 mg by mouth daily.        . potassium chloride SA (K-DUR,KLOR-CON) 20 MEQ tablet Take 20 mEq by mouth daily.        Marland Kitchen. warfarin (COUMADIN) 5 MG tablet Take 5 mg by mouth as directed.       No current facility-administered medications for this visit.     Allergies:   Acetaminophen-codeine; Amoxicillin; and Diovan   Social History:  The patient  reports that he quit smoking about 47 years ago. He has never used smokeless tobacco. He reports that he drinks alcohol. He reports that he does not use illicit drugs.   Family History:  The patient's family history includes Colon cancer in his mother; Heart attack in his brother and mother; Leukemia in his father.   ROS:  Please see the history of present illness.      All other systems reviewed and negative.   PHYSICAL EXAM: VS:  BP 160/90  Pulse 83  Ht 5\' 7"  (1.702 m)  Wt 276 lb 12.8 oz (125.556 kg)  BMI 43.34 kg/m2 Well nourished, well developed, in no acute distress HEENT: normal Neck: min elevated JVD Cardiac:  normal S1, S2; RRR; 1/6 systolic murmur heard best at the RUSB Lungs:  Decreased breath sounds at the bases bilaterally with a question of egophony at the left base; no rales; no wheezing Abd: soft, nontender, no hepatomegaly Ext: trace-1+ bilateral LE edema Skin: warm and dry Neuro:  CNs 2-12 intact, no focal abnormalities noted  EKG:  NSR, HR 83, LAD, nonspecific ST-T wave changes, PVCs, QTc 467     ASSESSMENT AND PLAN:  Shortness of breath -  I suspect that his dyspnea is multifactorial and probably somewhat related to  deconditioning from his surgery. However, he does have egophony on his lung exam and I'm concerned that he may have developed worsening left pleural effusion. I will arrange a chest x-ray today. He continues to have evidence of volume overload. I will continue him on Lasix 40 mg daily along with potassium 20 mEq  daily. Check a basic metabolic panel today and repeat a basic metabolic panel in one week.  Chronic Diastolic CHF:  As noted, he continues to have evidence of volume excess in the setting of normal LV function postoperatively. I will continue him on Lasix for diuresis as noted above.  CAD s/p CABG 12/02/13:  He will contact cardiac rehabilitation to arrange his initial appointment.continue aspirin, statin, beta blocker.  PAF (paroxysmal atrial fibrillation):  Maintaining NSR. Continue Coumadin. Consider stopping amiodarone at followup if he remains in normal sinus rhythm.  Mild aortic stenosis:  Consider followup echocardiogram in 1-2 years.  Essential hypertension:  Blood pressure elevated today.  Blood pressures at home have been optimal until now.  I have asked him to monitor this over the next couple of days. Home health nurse is coming out in a couple of days as well. He will call us with his blood pressure readings at that time.  If elevated, adjust medical Rx.  Dyslipidemia:  Continue statin.   Bradycardia s/p MDT pacemaker:  Follow up with the EP as planned.   Disposition:  Follow up with Dr. [name] in 3-4 weeks.   Signed, Brynda Rim, MHS 12/30/2013 3:48 PM    [facility] [address] [place], [place], [place]  [address] Phone: [phone]; Fax: [phone]

## 2013-12-31 LAB — BASIC METABOLIC PANEL
BUN: 15 mg/dL (ref 6–23)
CO2: 23 meq/L (ref 19–32)
CREATININE: 1.1 mg/dL (ref 0.4–1.5)
Calcium: 8.9 mg/dL (ref 8.4–10.5)
Chloride: 108 mEq/L (ref 96–112)
GFR: 70.47 mL/min (ref >60.00)
GLUCOSE: 90 mg/dL (ref 70–99)
POTASSIUM: 3.7 meq/L (ref 3.5–5.1)
Sodium: 139 mEq/L (ref 135–145)

## 2013-12-31 NOTE — Addendum Note (Signed)
Addended by: Celine Ahr on: 12/31/2013 10:23 AM   Modules accepted: Orders

## 2014-01-01 ENCOUNTER — Telehealth: Payer: Self-pay | Admitting: Physician Assistant

## 2014-01-01 NOTE — Telephone Encounter (Signed)
Pt states he is feeling better everyday. Pt states his weight is down to about 266.8 lb compared to 8/4 ov 276. Pt said he slept at least 4 hours last night which is somewhat better for him. Pt states HHRN got BP today around 2 pm 144/86 HR 97. He said his BP machine needed batteries so he could not compare his machine to what [facility] got. I told him that I will Tereso Newcomer, PA know the good news. Pt said thank you for all the help we have given him.

## 2014-01-01 NOTE — Telephone Encounter (Signed)
New message     Calling Brandon Klein to give her bp readings

## 2014-01-05 ENCOUNTER — Other Ambulatory Visit: Payer: Self-pay | Admitting: Cardiothoracic Surgery

## 2014-01-05 ENCOUNTER — Telehealth: Payer: Self-pay | Admitting: Physician Assistant

## 2014-01-05 DIAGNOSIS — I359 Nonrheumatic aortic valve disorder, unspecified: Secondary | ICD-10-CM

## 2014-01-05 MED ORDER — AMLODIPINE BESYLATE 5 MG PO TABS
5.0000 mg | ORAL_TABLET | Freq: Every day | ORAL | Status: DC
Start: 2014-01-05 — End: 2014-04-17

## 2014-01-05 NOTE — Telephone Encounter (Signed)
New message     Talk to Brandon Klein regarding his bp problem.

## 2014-01-05 NOTE — Telephone Encounter (Signed)
Add Norvasc 5 mg QD. Tereso Newcomer, PA-C   01/05/2014 1:38 PM

## 2014-01-05 NOTE — Telephone Encounter (Signed)
Contacted pt to inform him per Tereso Newcomer PA-C, the pt should start taking Norvasc 5 mg QD and continue monitoring his BP and HR and giving [place] feedback.  Confirmed pharmacy with the pt.  Pt verbalized understanding and agrees with this plan.

## 2014-01-05 NOTE — Telephone Encounter (Signed)
Pt calling to make [facility] PA-C of current BP readings.  Pt states he does not think that current BP medication regimen is controlling his BP very well.  Pt states he has a HHN who comes once a week to check his BP, but she is not due to come in until Thursday. Pt states he went out and bought a new BP machine d/t malfunction in his old one.  Pt states yesterday with the new machine his BP was 168-90 and this morning it was 188/100.  Pt denies any HA, cardiac complaints, sob, feeling dizzy or faint, or being in any acute distress at this time.  Pt would like for Tereso Newcomer to prescribe him something different for his BP issues, as discussed at last OV on 8/4.  Informed pt that I will route this message to [facility] for further review and recommendation and f/u with him thereafter.  Advised pt to take his BP at the same time everyday.  Advised pt to maintain a low sodium diet.  Advised pt to drink plenty of fluids and maintain compliance with meds prescribed.  Pt verbalized understanding and agrees with this plan.

## 2014-01-06 ENCOUNTER — Ambulatory Visit (INDEPENDENT_AMBULATORY_CARE_PROVIDER_SITE_OTHER): Payer: Medicare Other | Admitting: *Deleted

## 2014-01-06 ENCOUNTER — Telehealth: Payer: Self-pay | Admitting: Nurse Practitioner

## 2014-01-06 DIAGNOSIS — I2510 Atherosclerotic heart disease of native coronary artery without angina pectoris: Secondary | ICD-10-CM

## 2014-01-06 DIAGNOSIS — R7309 Other abnormal glucose: Secondary | ICD-10-CM

## 2014-01-06 DIAGNOSIS — Z9861 Coronary angioplasty status: Secondary | ICD-10-CM

## 2014-01-06 DIAGNOSIS — R739 Hyperglycemia, unspecified: Secondary | ICD-10-CM

## 2014-01-06 LAB — BASIC METABOLIC PANEL
BUN: 16 mg/dL (ref 6–23)
CALCIUM: 8.8 mg/dL (ref 8.4–10.5)
CO2: 24 mEq/L (ref 19–32)
CREATININE: 1.2 mg/dL (ref 0.4–1.5)
Chloride: 102 mEq/L (ref 96–112)
GFR: 66.25 mL/min (ref >60.00)
Glucose, Bld: 116 mg/dL — ABNORMAL HIGH (ref 70–99)
Potassium: 2.9 mEq/L — ABNORMAL LOW (ref 3.5–5.1)
Sodium: 139 mEq/L (ref 135–145)

## 2014-01-06 NOTE — Telephone Encounter (Signed)
Patient presents here for lab appointment requesting BP check.  Patient's BP 150/84 manual in left arm; HR 84 bpm.  Patient was given this information and discharged in NAD

## 2014-01-07 ENCOUNTER — Other Ambulatory Visit: Payer: Self-pay | Admitting: General Surgery

## 2014-01-07 ENCOUNTER — Encounter: Payer: Self-pay | Admitting: Cardiothoracic Surgery

## 2014-01-07 ENCOUNTER — Ambulatory Visit (INDEPENDENT_AMBULATORY_CARE_PROVIDER_SITE_OTHER): Payer: Self-pay | Admitting: Cardiothoracic Surgery

## 2014-01-07 ENCOUNTER — Ambulatory Visit
Admission: RE | Admit: 2014-01-07 | Discharge: 2014-01-07 | Disposition: A | Discharge status: Home / Self Care | Payer: Medicare Other | Source: Ambulatory Visit | Attending: Cardiothoracic Surgery | Admitting: Cardiothoracic Surgery

## 2014-01-07 VITALS — BP 172/93 | HR 81 | Ht 67.0 in | Wt 276.0 lb

## 2014-01-07 DIAGNOSIS — I10 Essential (primary) hypertension: Secondary | ICD-10-CM

## 2014-01-07 DIAGNOSIS — I480 Paroxysmal atrial fibrillation: Secondary | ICD-10-CM

## 2014-01-07 DIAGNOSIS — I350 Nonrheumatic aortic (valve) stenosis: Secondary | ICD-10-CM

## 2014-01-07 DIAGNOSIS — Z79899 Other long term (current) drug therapy: Secondary | ICD-10-CM

## 2014-01-07 DIAGNOSIS — I2510 Atherosclerotic heart disease of native coronary artery without angina pectoris: Secondary | ICD-10-CM

## 2014-01-07 DIAGNOSIS — R001 Bradycardia, unspecified: Secondary | ICD-10-CM

## 2014-01-07 DIAGNOSIS — I359 Nonrheumatic aortic valve disorder, unspecified: Secondary | ICD-10-CM

## 2014-01-07 DIAGNOSIS — E785 Hyperlipidemia, unspecified: Secondary | ICD-10-CM

## 2014-01-07 DIAGNOSIS — R0602 Shortness of breath: Secondary | ICD-10-CM

## 2014-01-07 DIAGNOSIS — Z951 Presence of aortocoronary bypass graft: Secondary | ICD-10-CM

## 2014-01-07 DIAGNOSIS — R600 Localized edema: Secondary | ICD-10-CM

## 2014-01-07 DIAGNOSIS — Z9861 Coronary angioplasty status: Secondary | ICD-10-CM

## 2014-01-07 MED ORDER — POTASSIUM CHLORIDE CRYS ER 20 MEQ PO TBCR: 20.0000 meq | EXTENDED_RELEASE_TABLET | Freq: Two times a day (BID) | ORAL | Status: DC

## 2014-01-07 NOTE — Progress Notes (Signed)
PCP is Gaye Alken, MD Referring Provider is [name], MD  Chief Complaint  Patient presents with  . F/U CARDIAC    4 WEEK F/U CABG    HPI: 4 weeks followup after multivessel CABG for unstable angina. Patient had problems with fluid retention dueto his obesity which has improved with Lasix. The patient had a postop left pleural effusion, moderate, which is now significantly improved with Lasix. He has noticed improved breathing and excise tolerance. He denies angina. The surgical incisions are healing well. He has some numbness in his right hand ulnar distribution from the sternotomy stretch. He denies any sternal instability or clicking  Patient was in atrial fibrillation postop but is now converted to sinus rhythm. He remains on amiodarone and Coumadin per cardiology.  Patient denies any surgical pain and is not taking pain medication. He is anxious to resume driving and start cardiac rehabilitation.   Past Medical History  Diagnosis Date  . Syncope     Vasovagal With documented bradycardia  . Pacemaker   . Obesity   . HTN (hypertension)   . HLD (hyperlipidemia)   . DJD (degenerative joint disease)   . GERD (gastroesophageal reflux disease)   . Impaired fasting glucose     A1C check every year  . PAF (paroxysmal atrial fibrillation)     on chronic systemic anticoagulation  . Coronary artery disease     s/p PCI of LAD 1997 and cutting balloon angioplasty to the left circ in 2002  . Dysrhythmia     hx of atrial fibrilation  . Heart murmur     Past Surgical History  Procedure Laterality Date  . Cholecystectomy    . Knee arthroscopy  2000    Left  . Angioplasty  1997  . Pacemaker insertion  2002  . Coronary artery bypass graft N/A 12/01/2013    Procedure: CORONARY ARTERY BYPASS GRAFT TIMES FOUR USING LEFT INTERNAL MAMMARY ARTERY TO LAD, SAPHENOUS VEIN GRAFTS TO DIAGONAL, CIRCUMFELX AND POSTERIOR DESCENDING;  Surgeon: Kerin Perna, MD;  Location: [facility] OR;   Service: Open Heart Surgery;  Laterality: N/A;    Family History  Problem Relation Age of Onset  . Leukemia Father     deceased  . Colon cancer Mother     deceased  . Heart attack Mother   . Heart attack Brother     deceased    Social History History  Substance Use Topics  . Smoking status: Former Smoker    Quit date: 05/29/1966  . Smokeless tobacco: Never Used  . Alcohol Use: Yes     Comment: ocassional    Current Outpatient Prescriptions  Medication Sig Dispense Refill  . amiodarone (PACERONE) 200 MG tablet Take 1 tablet (200 mg total) by mouth 2 (two) times daily. For 7 days, Then decrease to 200 mg daily  60 tablet  1  . amLODipine (NORVASC) 5 MG tablet Take 1 tablet (5 mg total) by mouth daily.  180 tablet  3  . aspirin EC 81 MG EC tablet Take 1 tablet (81 mg total) by mouth daily.      Marland Kitchen atorvastatin (LIPITOR) 20 MG tablet Take 20 mg by mouth daily.        . cholecalciferol (GNP VITAMIN D) 400 UNITS TABS Take 400 Units by mouth daily.        . fexofenadine (ALLEGRA) 180 MG tablet Take 180 mg by mouth daily.        . fluticasone (FLONASE) 50 MCG/ACT nasal spray  Place 2 sprays into the nose daily.        . furosemide (LASIX) 40 MG tablet Take 1 tablet (40 mg total) by mouth daily. For 7 Days  30 tablet  11  . Glucosamine-Chondroit-Vit C-Mn (GLUCOSAMINE CHONDR 1500 COMPLX) CAPS Take 1 capsule by mouth daily.       Marland Kitchen. L-Lysine 500 MG CAPS Take 1 capsule by mouth daily.        . meclizine (ANTIVERT) 25 MG tablet Take 25 mg by mouth 3 (three) times daily as needed for dizziness.       . metoprolol tartrate (LOPRESSOR) 25 MG tablet Take 1 tablet (25 mg total) by mouth 2 (two) times daily.  60 tablet  3  . Multiple Vitamin (MULTIVITAMIN) tablet Take 1 tablet by mouth daily.        . Omega-3 Fatty Acids (FISH OIL) 500 MG CAPS Take 1,000 mg by mouth 2 (two) times daily.      . pantoprazole (PROTONIX) 40 MG tablet Take 40 mg by mouth daily.        . potassium chloride SA  (K-DUR,KLOR-CON) 20 MEQ tablet Take 1 tablet (20 mEq total) by mouth daily.  30 tablet  11  . warfarin (COUMADIN) 5 MG tablet Take 5 mg by mouth as directed.      Marland Kitchen. oxyCODONE (OXY IR/ROXICODONE) 5 MG immediate release tablet Take 1-2 tablets (5-10 mg total) by mouth every 3 (three) hours as needed for moderate pain.  30 tablet  0   No current facility-administered medications for this visit.    Allergies  Allergen Reactions  . Acetaminophen-Codeine     Extreme constipation   . Amoxicillin Other (See Comments)    Unknown-maybe a rash?  . Diovan [Valsartan] Other (See Comments)    Unknown     Review of Systems no fevers Surgical incisions healing well Peripheral edema improved with Lasix. He states his weight is the lowest it has been in 5-10 years.  BP 172/93  Pulse 81  Ht 5\' 7"  (1.702 m)  Wt 276 lb (125.193 kg)  BMI 43.22 kg/m2  SpO2 97% Physical Exam Alert and appropriate Breath sounds clear and equal  sternal incision well-healed Heart rhythm regular without gallop Extremities with minimal edema, vein sited harvest healed  Diagnostic Tests: Chest x-ray clear with improved small residual left pleural effusion  Impression: Patient resume driving and lifting up to 15 pounds. Patient occurs start outpatient cardiac rehabilitation. Patient occurs remain on Lasix for another 30 days   Plan:Return in 4 weeks with chest x-ray to confirm resolution of postoperative pleural effusion. Continue current medication

## 2014-01-09 ENCOUNTER — Other Ambulatory Visit (INDEPENDENT_AMBULATORY_CARE_PROVIDER_SITE_OTHER): Payer: Medicare Other

## 2014-01-09 ENCOUNTER — Telehealth: Payer: Self-pay | Admitting: *Deleted

## 2014-01-09 DIAGNOSIS — Z79899 Other long term (current) drug therapy: Secondary | ICD-10-CM

## 2014-01-09 DIAGNOSIS — I10 Essential (primary) hypertension: Secondary | ICD-10-CM

## 2014-01-09 LAB — BASIC METABOLIC PANEL
BUN: 20 mg/dL (ref 6–23)
CALCIUM: 9.6 mg/dL (ref 8.4–10.5)
CHLORIDE: 103 meq/L (ref 96–112)
CO2: 27 mEq/L (ref 19–32)
CREATININE: 1.1 mg/dL (ref 0.4–1.5)
GFR: 69.01 mL/min (ref >60.00)
Glucose, Bld: 117 mg/dL — ABNORMAL HIGH (ref 70–99)
Potassium: 3.3 mEq/L — ABNORMAL LOW (ref 3.5–5.1)
Sodium: 137 mEq/L (ref 135–145)

## 2014-01-09 MED ORDER — POTASSIUM CHLORIDE CRYS ER 20 MEQ PO TBCR: 40.0000 meq | EXTENDED_RELEASE_TABLET | Freq: Two times a day (BID) | ORAL | Status: DC

## 2014-01-09 NOTE — Telephone Encounter (Signed)
Pt notified about lab results and to increase K+ to 40 meq BID bmet 8/20, Pt was scheduled for fasting labs 8/18, so we moved those labs to Thursday 8/20 with Bmet.. Pt said ok and thank you and verbalized understanding to Plan of Care

## 2014-01-09 NOTE — Addendum Note (Signed)
Addended by: Tarri Fuller on: 01/09/2014 05:59 PM   Modules accepted: Level of Service, SmartSet

## 2014-01-09 NOTE — Progress Notes (Signed)
This encounter was created in error - please disregard.

## 2014-01-11 ENCOUNTER — Telehealth: Payer: Self-pay | Admitting: Cardiology

## 2014-01-11 NOTE — Telephone Encounter (Signed)
Kdur order was placed wrong - order was for Kdur 2 tablets in am and 2 tablets in pm for 1 day and then 2 tablets daily after that - not 2 tablets BID going forward

## 2014-01-12 ENCOUNTER — Other Ambulatory Visit: Payer: Self-pay | Admitting: General Surgery

## 2014-01-12 ENCOUNTER — Ambulatory Visit (INDEPENDENT_AMBULATORY_CARE_PROVIDER_SITE_OTHER): Payer: Medicare Other | Admitting: *Deleted

## 2014-01-12 ENCOUNTER — Telehealth: Payer: Self-pay | Admitting: General Surgery

## 2014-01-12 ENCOUNTER — Telehealth: Payer: Self-pay | Admitting: Cardiology

## 2014-01-12 DIAGNOSIS — I4891 Unspecified atrial fibrillation: Secondary | ICD-10-CM

## 2014-01-12 DIAGNOSIS — I480 Paroxysmal atrial fibrillation: Secondary | ICD-10-CM

## 2014-01-12 DIAGNOSIS — I10 Essential (primary) hypertension: Secondary | ICD-10-CM

## 2014-01-12 MED ORDER — POTASSIUM CHLORIDE CRYS ER 20 MEQ PO TBCR: EXTENDED_RELEASE_TABLET | ORAL | Status: DC

## 2014-01-12 MED ORDER — POTASSIUM CHLORIDE CRYS ER 20 MEQ PO TBCR: 20.0000 meq | EXTENDED_RELEASE_TABLET | Freq: Two times a day (BID) | ORAL | Status: DC

## 2014-01-12 NOTE — Telephone Encounter (Signed)
I do not know who refilled. I will send in correctly for you.

## 2014-01-12 NOTE — Telephone Encounter (Signed)
Pt realized last night while trying to sleep that his breathing has been reduced. He felt like his respirations were low. He tried to sleep in his chair and that worked for a hour or so. His mouth was dry after. He feels like he wakes having to gasp for more air. Pt is concerned it might be Apnea. He feels he has to take huge gasps of air while trying to sleep.  To Dr [name] to advise.

## 2014-01-12 NOTE — Progress Notes (Signed)
Remote pacemaker transmission.   

## 2014-01-12 NOTE — Telephone Encounter (Signed)
Spoke with pt and reminded pt of remote transmission that is due today. Pt verbalized understanding.   

## 2014-01-13 ENCOUNTER — Other Ambulatory Visit: Payer: Medicare Other

## 2014-01-13 ENCOUNTER — Other Ambulatory Visit: Payer: Self-pay | Admitting: General Surgery

## 2014-01-13 DIAGNOSIS — R0681 Apnea, not elsewhere classified: Secondary | ICD-10-CM

## 2014-01-13 NOTE — Telephone Encounter (Signed)
Pt is aware and ordered Sleep Study at [facility]

## 2014-01-13 NOTE — Telephone Encounter (Signed)
Please order a split night PSG 

## 2014-01-14 LAB — MDC_IDC_ENUM_SESS_TYPE_REMOTE
Battery Remaining Longevity: 142 mo
Brady Statistic AP VP Percent: 1 %
Brady Statistic AP VS Percent: 30 %
Brady Statistic AS VS Percent: 65 %
Lead Channel Impedance Value: 403 Ohm
Lead Channel Impedance Value: 810 Ohm
Lead Channel Pacing Threshold Amplitude: 1 V
Lead Channel Pacing Threshold Pulse Width: 0.4 ms
Lead Channel Sensing Intrinsic Amplitude: 2.8 mV
Lead Channel Sensing Intrinsic Amplitude: 22.4 mV
Lead Channel Setting Pacing Amplitude: 2 V
Lead Channel Setting Pacing Pulse Width: 0.4 ms
Lead Channel Setting Sensing Sensitivity: 5.6 mV
MDC IDC MSMT BATTERY IMPEDANCE: 135 Ohm
MDC IDC MSMT BATTERY VOLTAGE: 2.79 V
MDC IDC MSMT LEADCHNL RA PACING THRESHOLD AMPLITUDE: 0.5 V
MDC IDC MSMT LEADCHNL RV PACING THRESHOLD PULSEWIDTH: 0.4 ms
MDC IDC SESS DTM: 20150817152616
MDC IDC SET LEADCHNL RV PACING AMPLITUDE: 2.5 V
MDC IDC STAT BRADY AS VP PERCENT: 4 %

## 2014-01-15 ENCOUNTER — Other Ambulatory Visit (INDEPENDENT_AMBULATORY_CARE_PROVIDER_SITE_OTHER): Payer: Medicare Other

## 2014-01-15 ENCOUNTER — Encounter: Payer: Self-pay | Admitting: General Surgery

## 2014-01-15 ENCOUNTER — Encounter (HOSPITAL_COMMUNITY)
Admission: RE | Admit: 2014-01-15 | Discharge: 2014-01-15 | Disposition: A | Discharge status: Home / Self Care | Payer: Medicare Other | Source: Ambulatory Visit | Attending: Cardiology | Admitting: Cardiology

## 2014-01-15 DIAGNOSIS — Z5189 Encounter for other specified aftercare: Secondary | ICD-10-CM | POA: Insufficient documentation

## 2014-01-15 DIAGNOSIS — I2510 Atherosclerotic heart disease of native coronary artery without angina pectoris: Secondary | ICD-10-CM | POA: Insufficient documentation

## 2014-01-15 DIAGNOSIS — I498 Other specified cardiac arrhythmias: Secondary | ICD-10-CM | POA: Insufficient documentation

## 2014-01-15 DIAGNOSIS — I359 Nonrheumatic aortic valve disorder, unspecified: Secondary | ICD-10-CM | POA: Insufficient documentation

## 2014-01-15 DIAGNOSIS — Z9861 Coronary angioplasty status: Secondary | ICD-10-CM | POA: Insufficient documentation

## 2014-01-15 DIAGNOSIS — I4891 Unspecified atrial fibrillation: Secondary | ICD-10-CM | POA: Insufficient documentation

## 2014-01-15 DIAGNOSIS — I10 Essential (primary) hypertension: Secondary | ICD-10-CM

## 2014-01-15 DIAGNOSIS — I209 Angina pectoris, unspecified: Secondary | ICD-10-CM | POA: Insufficient documentation

## 2014-01-15 LAB — BASIC METABOLIC PANEL
BUN: 17 mg/dL (ref 6–23)
CO2: 28 meq/L (ref 19–32)
Calcium: 9 mg/dL (ref 8.4–10.5)
Chloride: 104 mEq/L (ref 96–112)
Creatinine, Ser: 1.1 mg/dL (ref 0.4–1.5)
GFR: 69 mL/min (ref >60.00)
GLUCOSE: 114 mg/dL — AB (ref 70–99)
POTASSIUM: 3.8 meq/L (ref 3.5–5.1)
SODIUM: 137 meq/L (ref 135–145)

## 2014-01-15 LAB — ALT: ALT: 17 U/L (ref 0–53)

## 2014-01-15 NOTE — Progress Notes (Signed)
Cardiac Rehab Medication Review by a Pharmacist  Does the patient  feel that his/her medications are working for him/her?  yes  Has the patient been experiencing any side effects to the medications prescribed?  no  Does the patient measure his/her own blood pressure or blood glucose at home?  yes   Does the patient have any problems obtaining medications due to transportation or finances?   no  Understanding of regimen: excellent Understanding of indications: good Potential of compliance: excellent  Pharmacist comments: Pt has a good understanding of medications and indications for use.  Pt uses a pill box and reports almost never missing any doses.  Reviewed use of SL NTG and answered any questions/concerns.  Waynette Buttery, PharmD Clinical Pharmacy Resident Pager: [phone] 01/15/2014 8:28 AM

## 2014-01-16 LAB — NMR LIPOPROFILE WITH LIPIDS
CHOLESTEROL, TOTAL: 107 mg/dL (ref <200)
HDL PARTICLE NUMBER: 28.2 umol/L — AB (ref >=30.5)
HDL Size: 9 nm — ABNORMAL LOW (ref >=9.2)
HDL-C: 38 mg/dL — AB (ref >=40)
LDL (calc): 42 mg/dL (ref <100)
LDL Particle Number: 807 nmol/L (ref <1000)
LDL Size: 19.9 nm — ABNORMAL LOW (ref >20.5)
LP-IR Score: 41 (ref <=45)
Large HDL-P: 4.4 umol/L — ABNORMAL LOW (ref >=4.8)
Large VLDL-P: 1.5 nmol/L (ref <=2.7)
Small LDL Particle Number: 669 nmol/L — ABNORMAL HIGH (ref <=527)
TRIGLYCERIDES: 134 mg/dL (ref <150)
VLDL Size: 44 nm (ref <=46.6)

## 2014-01-19 ENCOUNTER — Encounter (HOSPITAL_COMMUNITY)
Admission: RE | Admit: 2014-01-19 | Discharge: 2014-01-19 | Disposition: A | Discharge status: Home / Self Care | Payer: Medicare Other | Source: Ambulatory Visit | Attending: Cardiology | Admitting: Cardiology

## 2014-01-19 ENCOUNTER — Other Ambulatory Visit: Payer: Self-pay | Admitting: General Surgery

## 2014-01-19 ENCOUNTER — Encounter: Payer: Self-pay | Admitting: General Surgery

## 2014-01-19 ENCOUNTER — Encounter (HOSPITAL_COMMUNITY): Payer: Self-pay

## 2014-01-19 DIAGNOSIS — I4891 Unspecified atrial fibrillation: Secondary | ICD-10-CM | POA: Diagnosis not present

## 2014-01-19 DIAGNOSIS — I359 Nonrheumatic aortic valve disorder, unspecified: Secondary | ICD-10-CM | POA: Diagnosis not present

## 2014-01-19 DIAGNOSIS — Z5189 Encounter for other specified aftercare: Secondary | ICD-10-CM | POA: Diagnosis not present

## 2014-01-19 DIAGNOSIS — I498 Other specified cardiac arrhythmias: Secondary | ICD-10-CM | POA: Diagnosis present

## 2014-01-19 DIAGNOSIS — I209 Angina pectoris, unspecified: Secondary | ICD-10-CM | POA: Diagnosis not present

## 2014-01-19 DIAGNOSIS — E785 Hyperlipidemia, unspecified: Secondary | ICD-10-CM

## 2014-01-19 DIAGNOSIS — Z9861 Coronary angioplasty status: Secondary | ICD-10-CM | POA: Diagnosis not present

## 2014-01-19 DIAGNOSIS — I2510 Atherosclerotic heart disease of native coronary artery without angina pectoris: Secondary | ICD-10-CM | POA: Diagnosis not present

## 2014-01-19 NOTE — Progress Notes (Signed)
Pt started cardiac rehab today.  Pt tolerated light exercise without difficulty.  VSS, telemetry-sinus rhythm, frequent PVC.  Asymptomatic.  PHQ-0.  Pt exhibits no barriers to rehab participation.  Pt displays good coping skills, positive outlook and strong family support.  Pt goals for cardiac rehab are to return to his activities as prior to his cardiac event, primarily playing golf.   Pt oriented to exercise equipment and routine.  Understanding verbalized.

## 2014-01-20 ENCOUNTER — Ambulatory Visit (INDEPENDENT_AMBULATORY_CARE_PROVIDER_SITE_OTHER): Payer: Medicare Other | Admitting: *Deleted

## 2014-01-20 DIAGNOSIS — I4891 Unspecified atrial fibrillation: Secondary | ICD-10-CM

## 2014-01-20 LAB — POCT INR: INR: 2.1

## 2014-01-21 ENCOUNTER — Other Ambulatory Visit: Payer: Medicare Other

## 2014-01-21 ENCOUNTER — Telehealth: Payer: Self-pay | Admitting: Physician Assistant

## 2014-01-21 ENCOUNTER — Encounter (HOSPITAL_COMMUNITY)
Admission: RE | Admit: 2014-01-21 | Discharge: 2014-01-21 | Disposition: A | Discharge status: Home / Self Care | Payer: Medicare Other | Source: Ambulatory Visit | Attending: Cardiology | Admitting: Cardiology

## 2014-01-21 ENCOUNTER — Other Ambulatory Visit (INDEPENDENT_AMBULATORY_CARE_PROVIDER_SITE_OTHER): Payer: Medicare Other

## 2014-01-21 ENCOUNTER — Other Ambulatory Visit: Payer: Self-pay | Admitting: Physician Assistant

## 2014-01-21 DIAGNOSIS — I493 Ventricular premature depolarization: Secondary | ICD-10-CM

## 2014-01-21 DIAGNOSIS — I4949 Other premature depolarization: Secondary | ICD-10-CM | POA: Diagnosis not present

## 2014-01-21 DIAGNOSIS — Z5189 Encounter for other specified aftercare: Secondary | ICD-10-CM | POA: Diagnosis not present

## 2014-01-21 LAB — BASIC METABOLIC PANEL
BUN: 16 mg/dL (ref 6–23)
CHLORIDE: 102 meq/L (ref 96–112)
CO2: 27 meq/L (ref 19–32)
CREATININE: 1.2 mg/dL (ref 0.4–1.5)
Calcium: 8.9 mg/dL (ref 8.4–10.5)
GFR: 62.46 mL/min (ref >60.00)
Glucose, Bld: 85 mg/dL (ref 70–99)
POTASSIUM: 3.3 meq/L — AB (ref 3.5–5.1)
SODIUM: 138 meq/L (ref 135–145)

## 2014-01-21 LAB — MAGNESIUM: MAGNESIUM: 1.4 mg/dL — AB (ref 1.5–2.5)

## 2014-01-21 NOTE — Telephone Encounter (Signed)
See prior phone note from cardiac rehab regarding PVCs.  BMET resulted to my inbasket, Mg not yet resulted. K 3.3. GR 62. I called the patient at home. He takes KCL BID. The level was drawn before his PM dose today. I asked him to take an additional this evening. I will forward to Dr. [name] for further instruction since I will be out the next 2 days. He might need to consider increasing KCl to BID. Await Mg level as well. Dayna Dunn PA-C

## 2014-01-21 NOTE — Telephone Encounter (Signed)
Called by cardiac rehab nurse Byrd Hesselbach for occasional multifocal PVC couplets on telemetry, asymptomatic, VSS. No sustained arrhythmias. He is s/p CABG in 11/2013. Normal EF. Has had issues with low potassium earlier this month and is on repletion. I think it's reasonable to check a BMET/Mg to make sure lytes are stable. He is on a BB. Orders have been entered and I spoke with triage at our office for a lab appointment today. I relayed this to Byrd Hesselbach - the patient will go over to the office before 5pm today to have these drawn. Pieter Fooks PA-C

## 2014-01-21 NOTE — Progress Notes (Signed)
Brandon Klein was noted to have intermittent frequent PVC and multifocal couplets. Patient asymptomatic. Vital signs stable. [facility] called and notified. [name] reviewed the ECG tracing's from cardiac and ordered a BMET and MAG level at [facility] office. Patient agreeable and will go to the lab.

## 2014-01-22 ENCOUNTER — Other Ambulatory Visit: Payer: Self-pay | Admitting: General Surgery

## 2014-01-22 DIAGNOSIS — Z79899 Other long term (current) drug therapy: Secondary | ICD-10-CM

## 2014-01-22 MED ORDER — MAGNESIUM OXIDE 250 MG PO TABS: 250.0000 mg | ORAL_TABLET | Freq: Every day | ORAL | Status: DC

## 2014-01-22 MED ORDER — POTASSIUM CHLORIDE CRYS ER 20 MEQ PO TBCR: 40.0000 meq | EXTENDED_RELEASE_TABLET | Freq: Two times a day (BID) | ORAL | Status: DC

## 2014-01-22 NOTE — Telephone Encounter (Signed)
Please have him come back in today for repeat BMET

## 2014-01-22 NOTE — Telephone Encounter (Signed)
Labs in cancelled request. Did what lab requested.

## 2014-01-22 NOTE — Progress Notes (Signed)
increase potassium to 2 tablets twice daily. Also needs to go on Magnesium oxide 250mg  1 tablet daily and recheck BMET and Magnesium level on Monday 8/31

## 2014-01-23 ENCOUNTER — Encounter (HOSPITAL_COMMUNITY)
Admission: RE | Admit: 2014-01-23 | Discharge: 2014-01-23 | Disposition: A | Discharge status: Home / Self Care | Payer: Medicare Other | Source: Ambulatory Visit | Attending: Cardiology | Admitting: Cardiology

## 2014-01-23 DIAGNOSIS — Z5189 Encounter for other specified aftercare: Secondary | ICD-10-CM | POA: Diagnosis not present

## 2014-01-26 ENCOUNTER — Other Ambulatory Visit (INDEPENDENT_AMBULATORY_CARE_PROVIDER_SITE_OTHER): Payer: Medicare Other

## 2014-01-26 ENCOUNTER — Encounter (HOSPITAL_COMMUNITY)
Admission: RE | Admit: 2014-01-26 | Discharge: 2014-01-26 | Disposition: A | Discharge status: Home / Self Care | Payer: Medicare Other | Source: Ambulatory Visit | Attending: Cardiology | Admitting: Cardiology

## 2014-01-26 DIAGNOSIS — Z5189 Encounter for other specified aftercare: Secondary | ICD-10-CM | POA: Diagnosis not present

## 2014-01-26 DIAGNOSIS — Z79899 Other long term (current) drug therapy: Secondary | ICD-10-CM

## 2014-01-26 LAB — BASIC METABOLIC PANEL
BUN: 17 mg/dL (ref 6–23)
CALCIUM: 9.1 mg/dL (ref 8.4–10.5)
CO2: 28 mEq/L (ref 19–32)
Chloride: 104 mEq/L (ref 96–112)
Creatinine, Ser: 1.2 mg/dL (ref 0.4–1.5)
GFR: 64.3 mL/min (ref >60.00)
GLUCOSE: 98 mg/dL (ref 70–99)
Potassium: 3.7 mEq/L (ref 3.5–5.1)
Sodium: 140 mEq/L (ref 135–145)

## 2014-01-27 ENCOUNTER — Encounter: Payer: Self-pay | Admitting: Cardiology

## 2014-01-27 ENCOUNTER — Ambulatory Visit: Payer: Medicare Other

## 2014-01-27 DIAGNOSIS — Z79899 Other long term (current) drug therapy: Secondary | ICD-10-CM

## 2014-01-27 LAB — MAGNESIUM: Magnesium: 1.6 mg/dL (ref 1.5–2.5)

## 2014-01-28 ENCOUNTER — Ambulatory Visit (INDEPENDENT_AMBULATORY_CARE_PROVIDER_SITE_OTHER): Payer: Medicare Other | Admitting: Cardiology

## 2014-01-28 ENCOUNTER — Other Ambulatory Visit: Payer: Self-pay | Admitting: Cardiothoracic Surgery

## 2014-01-28 ENCOUNTER — Encounter (HOSPITAL_COMMUNITY): Payer: Medicare Other

## 2014-01-28 ENCOUNTER — Encounter (HOSPITAL_COMMUNITY)
Admission: RE | Admit: 2014-01-28 | Discharge: 2014-01-28 | Disposition: A | Discharge status: Home / Self Care | Payer: Medicare Other | Source: Ambulatory Visit | Attending: Cardiology | Admitting: Cardiology

## 2014-01-28 ENCOUNTER — Encounter: Payer: Self-pay | Admitting: Cardiology

## 2014-01-28 VITALS — BP 130/70 | HR 78 | Ht 70.0 in | Wt 266.0 lb

## 2014-01-28 DIAGNOSIS — I209 Angina pectoris, unspecified: Secondary | ICD-10-CM | POA: Diagnosis not present

## 2014-01-28 DIAGNOSIS — I498 Other specified cardiac arrhythmias: Secondary | ICD-10-CM | POA: Insufficient documentation

## 2014-01-28 DIAGNOSIS — I480 Paroxysmal atrial fibrillation: Secondary | ICD-10-CM

## 2014-01-28 DIAGNOSIS — Z9861 Coronary angioplasty status: Secondary | ICD-10-CM | POA: Diagnosis not present

## 2014-01-28 DIAGNOSIS — I359 Nonrheumatic aortic valve disorder, unspecified: Secondary | ICD-10-CM | POA: Diagnosis not present

## 2014-01-28 DIAGNOSIS — Z5189 Encounter for other specified aftercare: Secondary | ICD-10-CM | POA: Insufficient documentation

## 2014-01-28 DIAGNOSIS — I350 Nonrheumatic aortic (valve) stenosis: Secondary | ICD-10-CM | POA: Insufficient documentation

## 2014-01-28 DIAGNOSIS — R001 Bradycardia, unspecified: Secondary | ICD-10-CM

## 2014-01-28 DIAGNOSIS — I10 Essential (primary) hypertension: Secondary | ICD-10-CM

## 2014-01-28 DIAGNOSIS — I2510 Atherosclerotic heart disease of native coronary artery without angina pectoris: Secondary | ICD-10-CM

## 2014-01-28 DIAGNOSIS — I4891 Unspecified atrial fibrillation: Secondary | ICD-10-CM | POA: Insufficient documentation

## 2014-01-28 DIAGNOSIS — E785 Hyperlipidemia, unspecified: Secondary | ICD-10-CM

## 2014-01-28 DIAGNOSIS — Z7901 Long term (current) use of anticoagulants: Secondary | ICD-10-CM

## 2014-01-28 DIAGNOSIS — I9789 Other postprocedural complications and disorders of the circulatory system, not elsewhere classified: Secondary | ICD-10-CM

## 2014-01-28 MED ORDER — NITROGLYCERIN 0.4 MG SL SUBL: 0.4000 mg | SUBLINGUAL_TABLET | SUBLINGUAL | Status: DC | PRN

## 2014-01-28 MED ORDER — MAGNESIUM OXIDE 250 MG PO TABS: 250.0000 mg | ORAL_TABLET | Freq: Two times a day (BID) | ORAL | Status: DC

## 2014-01-28 NOTE — Patient Instructions (Addendum)
Your physician has recommended you make the following change in your medication: 1. Stop Amiodarone 2. Increase magnesium to 250MG  Twice a day  Please set up an appointment with the coumadin clinic before leaving today since we are stopping Amiodarone  Your physician recommends that you return for lab work in: One week on 02/04/14 for a BMET and Magnesium Level  Your physician wants you to follow-up in: 6 months with Dr Sherlyn Lick will receive a reminder letter in the mail two months in advance. If you don't receive a letter, please call our office to schedule the follow-up appointment.

## 2014-01-28 NOTE — Progress Notes (Signed)
[address], [address] [place], [place]  [address] Phone: [phone] Fax:  [phone]  Date:  01/28/2014   ID:  Brandon Klein, DOB [birth year], MRN [id number]  PCP:  Gaye Alken, MD  Cardiologist:  Armanda Magic, MD     History of Present Illness: Brandon Klein is a 73 y.o. male with a hx of CAD, status post prior stenting to the LAD and balloon angioplasty to the circumflex, paroxysmal atrial fibrillation, bradycardia s/p PPM, aortic stenosis, HTN, HL. He was recently seen with exertional chest discomfort and set up for stress testing. This returned high risk and he was set up for cardiac catheterization. This demonstrated 3 vessel CAD and he was referred for CABG. Patient was admitted 6/30-7/10. He underwent CABG with Dr. Donata Clay (LIMA-LAD, SVG-diagonal, SVG-circumflex, SVG-PDA). He developed postoperative atrial fibrillation and was placed on amiodarone. He has normal LVF by echo 10/2013.  Patient had volume overload and was sent home on Lasix. His weight came down nicely. However, after stopping this, his weight started to increase again. He was given another week of Lasix and then stopped it.  He started to have fluid buildup again and is now on chronic Lasix.  He now presents today for followup. He is now in cardiac rehab.  He denies any LE edema since going back on Lasix.  He says that his SOB has significantly improved and only notices it when he walks outside at his house.  He denies any chest pain.  He occasionally notices a skipped heart beat associated with his PVC's but no racing of his heart beat.   Wt Readings from Last 3 Encounters:  01/28/14 266 lb (120.657 kg)  01/15/14 268 lb 15.4 oz (122 kg)  01/07/14 276 lb (125.193 kg)     Past Medical History  Diagnosis Date  . Syncope     Vasovagal With documented bradycardia  . Pacemaker   . Obesity   . HTN (hypertension)   . HLD (hyperlipidemia)   . DJD (degenerative joint disease)   . GERD (gastroesophageal  reflux disease)   . Impaired fasting glucose     A1C check every year  . PAF (paroxysmal atrial fibrillation)     on chronic systemic anticoagulation  . Dysrhythmia     hx of atrial fibrilation  . Heart murmur   . Coronary artery disease     s/p PCI of LAD 1997 and cutting balloon angioplasty to the left circ in 2002, s/p cath 11/2013 with severe 3 vessel ASCAD s/p CABG with LIMA to LAD, SVG to diag, SVG to left circ and SVG to PDA  . Postoperative atrial fibrillation     Current Outpatient Prescriptions  Medication Sig Dispense Refill  . amiodarone (PACERONE) 200 MG tablet Take 200 mg by mouth daily.      Marland Kitchen amLODipine (NORVASC) 5 MG tablet Take 1 tablet (5 mg total) by mouth daily.  180 tablet  3  . aspirin EC 81 MG EC tablet Take 1 tablet (81 mg total) by mouth daily.      Marland Kitchen atorvastatin (LIPITOR) 20 MG tablet Take 20 mg by mouth daily.        . cholecalciferol (GNP VITAMIN D) 400 UNITS TABS Take 400 Units by mouth daily.        . fexofenadine (ALLEGRA) 180 MG tablet Take 180 mg by mouth daily.        . fluticasone (FLONASE) 50 MCG/ACT nasal spray Place 2 sprays into the nose  daily.        . furosemide (LASIX) 40 MG tablet Take 1 tablet (40 mg total) by mouth daily. For 7 Days  30 tablet  11  . Glucosamine-Chondroit-Vit C-Mn (GLUCOSAMINE CHONDR 1500 COMPLX) CAPS Take 1 capsule by mouth daily.       Marland Kitchen L-Lysine 500 MG CAPS Take 1 capsule by mouth daily.        . Magnesium Oxide 250 MG TABS Take 1 tablet (250 mg total) by mouth daily.  30 tablet  5  . meclizine (ANTIVERT) 25 MG tablet Take 25 mg by mouth 3 (three) times daily as needed for dizziness.       . metoprolol tartrate (LOPRESSOR) 25 MG tablet Take 1 tablet (25 mg total) by mouth 2 (two) times daily.  60 tablet  3  . Multiple Vitamin (MULTIVITAMIN) tablet Take 1 tablet by mouth daily.        . nitroGLYCERIN (NITROSTAT) 0.4 MG SL tablet Place 0.4 mg under the tongue every 5 (five) minutes as needed for chest pain.      . Omega-3  Fatty Acids (FISH OIL) 500 MG CAPS Take 1,000 mg by mouth 2 (two) times daily.      . ONE TOUCH ULTRA TEST test strip       . ONETOUCH DELICA LANCETS FINE MISC       . oxyCODONE (OXY IR/ROXICODONE) 5 MG immediate release tablet Take 1-2 tablets (5-10 mg total) by mouth every 3 (three) hours as needed for moderate pain.  30 tablet  0  . pantoprazole (PROTONIX) 40 MG tablet Take 40 mg by mouth daily.        . potassium chloride SA (K-DUR,KLOR-CON) 20 MEQ tablet Take 2 tablets (40 mEq total) by mouth 2 (two) times daily.  120 tablet  6  . warfarin (COUMADIN) 5 MG tablet Take 5 mg by mouth as directed. 7.5mg  Mon,  AOD       No current facility-administered medications for this visit.    Allergies:    Allergies  Allergen Reactions  . Acetaminophen-Codeine     Extreme constipation   . Amoxicillin Other (See Comments)    Unknown-maybe a rash?  . Diovan [Valsartan] Other (See Comments)    Unknown     Social History:  The patient  reports that he quit smoking about 47 years ago. He has never used smokeless tobacco. He reports that he drinks alcohol. He reports that he does not use illicit drugs.   Family History:  The patient's family history includes Colon cancer in his mother; Heart attack in his brother and mother; Leukemia in his father.   ROS:  Please see the history of present illness.      All other systems reviewed and negative.   PHYSICAL EXAM: VS:  BP 130/70  Pulse 78  Ht  (1.778 m)  Wt 266 lb (120.657 kg)  BMI 38.17 kg/m2 Well nourished, well developed, in no acute distress HEENT: normal Neck: no JVD Cardiac:  normal S1, S2; RRR; no murmur Lungs:  clear to auscultation bilaterally, no wheezing, rhonchi or rales Abd: soft, nontender, no hepatomegaly Ext: no edema Skin: warm and dry Neuro:  CNs 2-12 intact, no focal abnormalities noted  EKG:  NSR with nonspecific T wave abnormality, prolonged QTc at  ASSESSMENT AND PLAN:  1.  Shortness of breath - I  suspect that his dyspnea is multifactorial and probably somewhat related to deconditioning from his surgery.  2.  Chronic Diastolic  CHF class II:  He appears euvolemic - I will continue him on Lasix for diuresis  3.  CAD s/p CABG 12/02/13:Continue aspirin, statin, beta blocker.  4.  PAF (paroxysmal atrial fibrillation): Maintaining NSR. Continue Coumadin/BB. Will stop amiodarone as he is maintaining NSR.  Will get back into coumadin clinic to readjust dose of coumadin since we are stopping Amio 5.  Mild aortic stenosis: Consider followup echocardiogram in 1-2 years.  6.  Essential hypertension: controlled.  Continue amlodipine/BB 7.  Dyslipidemia: LDL at goal at 42.  Continue statin.  8.  Bradycardia s/p MDT pacemaker: Follow up with the EP as planned.   Followup with me in 6 months   Signed, Armanda Magic, MD 01/28/2014 2:12 PM

## 2014-01-29 ENCOUNTER — Ambulatory Visit (INDEPENDENT_AMBULATORY_CARE_PROVIDER_SITE_OTHER): Payer: Medicare Other | Admitting: *Deleted

## 2014-01-29 DIAGNOSIS — I4891 Unspecified atrial fibrillation: Secondary | ICD-10-CM

## 2014-01-29 LAB — POCT INR: INR: 2.5

## 2014-01-30 ENCOUNTER — Encounter (HOSPITAL_COMMUNITY)
Admission: RE | Admit: 2014-01-30 | Discharge: 2014-01-30 | Disposition: A | Discharge status: Home / Self Care | Payer: Medicare Other | Source: Ambulatory Visit | Attending: Cardiology | Admitting: Cardiology

## 2014-01-30 DIAGNOSIS — Z5189 Encounter for other specified aftercare: Secondary | ICD-10-CM | POA: Diagnosis not present

## 2014-01-30 NOTE — Progress Notes (Signed)
Pt arrived at cardiac rehab with runs of wide complex rhythm, rate-70's. Pt asymptomatic.  Rhythm strips faxed to [facility] for review.  Per Duwayne Heck, Dr. Myrtis Ser reviewed strips and  approved pt to exercise with no restrictions with recommendation for  Dr. [name] to review strips on Tuesday.

## 2014-01-30 NOTE — Progress Notes (Signed)
Reviewed home exercise guidelines with patient including endpoints, temperature precautions, target heart rate and rate of perceived exertion. Patient is currently walking 9 minutes daily as his mode of home exercise. Patient encouraged to try walking 10 minutes three times per day or 15 minutes twice per day to accumulate 30 minutes daily. Pt voices understanding of instructions given. Artist Pais, MS, ACSM CCEP

## 2014-02-04 ENCOUNTER — Encounter (HOSPITAL_COMMUNITY)
Admission: RE | Admit: 2014-02-04 | Discharge: 2014-02-04 | Disposition: A | Discharge status: Home / Self Care | Payer: Medicare Other | Source: Ambulatory Visit | Attending: Cardiology | Admitting: Cardiology

## 2014-02-04 ENCOUNTER — Ambulatory Visit
Admission: RE | Admit: 2014-02-04 | Discharge: 2014-02-04 | Disposition: A | Discharge status: Home / Self Care | Payer: Medicare Other | Source: Ambulatory Visit | Attending: Cardiothoracic Surgery | Admitting: Cardiothoracic Surgery

## 2014-02-04 ENCOUNTER — Ambulatory Visit (INDEPENDENT_AMBULATORY_CARE_PROVIDER_SITE_OTHER): Payer: Self-pay | Admitting: Cardiothoracic Surgery

## 2014-02-04 ENCOUNTER — Encounter: Payer: Self-pay | Admitting: Cardiothoracic Surgery

## 2014-02-04 ENCOUNTER — Other Ambulatory Visit (INDEPENDENT_AMBULATORY_CARE_PROVIDER_SITE_OTHER): Payer: Medicare Other

## 2014-02-04 VITALS — BP 128/77 | HR 82 | Resp 20 | Ht 70.0 in | Wt 266.0 lb

## 2014-02-04 DIAGNOSIS — E669 Obesity, unspecified: Secondary | ICD-10-CM | POA: Insufficient documentation

## 2014-02-04 DIAGNOSIS — I4891 Unspecified atrial fibrillation: Secondary | ICD-10-CM

## 2014-02-04 DIAGNOSIS — I2510 Atherosclerotic heart disease of native coronary artery without angina pectoris: Secondary | ICD-10-CM

## 2014-02-04 DIAGNOSIS — J90 Pleural effusion, not elsewhere classified: Secondary | ICD-10-CM

## 2014-02-04 DIAGNOSIS — I480 Paroxysmal atrial fibrillation: Secondary | ICD-10-CM

## 2014-02-04 DIAGNOSIS — Z5189 Encounter for other specified aftercare: Secondary | ICD-10-CM | POA: Diagnosis not present

## 2014-02-04 DIAGNOSIS — Z951 Presence of aortocoronary bypass graft: Secondary | ICD-10-CM

## 2014-02-04 DIAGNOSIS — I359 Nonrheumatic aortic valve disorder, unspecified: Secondary | ICD-10-CM

## 2014-02-04 LAB — BASIC METABOLIC PANEL
BUN: 19 mg/dL (ref 6–23)
CHLORIDE: 104 meq/L (ref 96–112)
CO2: 28 mEq/L (ref 19–32)
Calcium: 9.1 mg/dL (ref 8.4–10.5)
Creatinine, Ser: 1.2 mg/dL (ref 0.4–1.5)
GFR: 66.23 mL/min (ref >60.00)
Glucose, Bld: 86 mg/dL (ref 70–99)
POTASSIUM: 3.5 meq/L (ref 3.5–5.1)
Sodium: 138 mEq/L (ref 135–145)

## 2014-02-04 LAB — MAGNESIUM: MAGNESIUM: 1.6 mg/dL (ref 1.5–2.5)

## 2014-02-04 NOTE — Progress Notes (Signed)
PCP is Gaye Alken, MD Referring Provider is [name], MD  Chief Complaint  Patient presents with  . Routine Post Op    4 week f/u with CXR, pleural effusion    HPI: The followup after multivessel CABG. Patient returns for followup of a left pleural effusion. This is now resolved on today's chest x-ray. Overall the patient is improving-participating cardiac rehabilitation. He has lost approximately 10 pounds. His BMI is 38. He denies any symptoms of angina. He takes Lasix daily for fluid retention.   Past Medical History  Diagnosis Date  . Syncope     Vasovagal With documented bradycardia  . Pacemaker   . Obesity   . HTN (hypertension)   . HLD (hyperlipidemia)   . DJD (degenerative joint disease)   . GERD (gastroesophageal reflux disease)   . Impaired fasting glucose     A1C check every year  . PAF (paroxysmal atrial fibrillation)     on chronic systemic anticoagulation  . Dysrhythmia     hx of atrial fibrilation  . Heart murmur   . Coronary artery disease     s/p PCI of LAD 1997 and cutting balloon angioplasty to the left circ in 2002, s/p cath 11/2013 with severe 3 vessel ASCAD s/p CABG with LIMA to LAD, SVG to diag, SVG to left circ and SVG to PDA  . Postoperative atrial fibrillation   . AS (aortic stenosis)     mild by echo 10/2013    Past Surgical History  Procedure Laterality Date  . Cholecystectomy    . Knee arthroscopy  2000    Left  . Angioplasty  1997  . Pacemaker insertion  2002  . Coronary artery bypass graft N/A 12/01/2013    Procedure: CORONARY ARTERY BYPASS GRAFT TIMES FOUR USING LEFT INTERNAL MAMMARY ARTERY TO LAD, SAPHENOUS VEIN GRAFTS TO DIAGONAL, CIRCUMFELX AND POSTERIOR DESCENDING;  Surgeon: Kerin Perna, MD;  Location: [facility] OR;  Service: Open Heart Surgery;  Laterality: N/A;    Family History  Problem Relation Age of Onset  . Leukemia Father     deceased  . Colon cancer Mother     deceased  . Heart attack Mother   .  Heart attack Brother     deceased    Social History History  Substance Use Topics  . Smoking status: Former Smoker    Quit date: 05/29/1966  . Smokeless tobacco: Never Used  . Alcohol Use: Yes     Comment: ocassional    Current Outpatient Prescriptions  Medication Sig Dispense Refill  . amLODipine (NORVASC) 5 MG tablet Take 1 tablet (5 mg total) by mouth daily.  180 tablet  3  . aspirin EC 81 MG EC tablet Take 1 tablet (81 mg total) by mouth daily.      Marland Kitchen atorvastatin (LIPITOR) 20 MG tablet Take 20 mg by mouth daily.        . cholecalciferol (GNP VITAMIN D) 400 UNITS TABS Take 400 Units by mouth daily.        . fexofenadine (ALLEGRA) 180 MG tablet Take 180 mg by mouth daily.        . fluticasone (FLONASE) 50 MCG/ACT nasal spray Place 2 sprays into the nose daily.        . furosemide (LASIX) 40 MG tablet Take 1 tablet (40 mg total) by mouth daily. For 7 Days  30 tablet  11  . Glucosamine-Chondroit-Vit C-Mn (GLUCOSAMINE CHONDR 1500 COMPLX) CAPS Take 1 capsule by mouth daily.       Marland Kitchen  L-Lysine 500 MG CAPS Take 1 capsule by mouth daily.        . Magnesium Oxide 250 MG TABS Take 1 tablet (250 mg total) by mouth 2 (two) times daily.  60 tablet  5  . meclizine (ANTIVERT) 25 MG tablet Take 25 mg by mouth 3 (three) times daily as needed for dizziness.       . metoprolol tartrate (LOPRESSOR) 25 MG tablet Take 1 tablet (25 mg total) by mouth 2 (two) times daily.  60 tablet  3  . Multiple Vitamin (MULTIVITAMIN) tablet Take 1 tablet by mouth daily.        . nitroGLYCERIN (NITROSTAT) 0.4 MG SL tablet Place 1 tablet (0.4 mg total) under the tongue every 5 (five) minutes as needed for chest pain.  30 tablet  3  . Omega-3 Fatty Acids (FISH OIL) 500 MG CAPS Take 1,000 mg by mouth 2 (two) times daily.      . ONE TOUCH ULTRA TEST test strip       . ONETOUCH DELICA LANCETS FINE MISC       . oxyCODONE (OXY IR/ROXICODONE) 5 MG immediate release tablet Take 1-2 tablets (5-10 mg total) by mouth every 3  (three) hours as needed for moderate pain.  30 tablet  0  . pantoprazole (PROTONIX) 40 MG tablet Take 40 mg by mouth daily.        . potassium chloride SA (K-DUR,KLOR-CON) 20 MEQ tablet Take 2 tablets (40 mEq total) by mouth 2 (two) times daily.  120 tablet  6  . warfarin (COUMADIN) 5 MG tablet Take 5 mg by mouth as directed. 7.5mg  Mon,  AOD       No current facility-administered medications for this visit.    Allergies  Allergen Reactions  . Acetaminophen-Codeine     Extreme constipation   . Amoxicillin Other (See Comments)    Unknown-maybe a rash?  . Diovan [Valsartan] Other (See Comments)    Unknown     Review of Systems no fever, improved energy, improved exercise tolerance  BP 128/77  Pulse 82  Resp 20  Ht  (1.778 m)  Wt 266 lb (120.657 kg)  BMI 38.17 kg/m2  SpO2 97% Physical Exam Alert and comfortable Lungs clear Heart rate regular without murmur Surgical incisions all healed Mild pedal edema  Diagnostic Tests: Chest x-ray reviewed personally Resolution postop left pleural effusion  Impression: Continue and complete outpatient cardiac rehabilitation Do not lift more than 20 pounds until 3 months after surgery Return as needed

## 2014-02-06 ENCOUNTER — Encounter (HOSPITAL_COMMUNITY)
Admission: RE | Admit: 2014-02-06 | Discharge: 2014-02-06 | Disposition: A | Discharge status: Home / Self Care | Payer: Medicare Other | Source: Ambulatory Visit | Attending: Cardiology | Admitting: Cardiology

## 2014-02-06 DIAGNOSIS — Z5189 Encounter for other specified aftercare: Secondary | ICD-10-CM | POA: Diagnosis not present

## 2014-02-09 ENCOUNTER — Encounter (HOSPITAL_COMMUNITY)
Admission: RE | Admit: 2014-02-09 | Discharge: 2014-02-09 | Disposition: A | Discharge status: Home / Self Care | Payer: Medicare Other | Source: Ambulatory Visit | Attending: Cardiology | Admitting: Cardiology

## 2014-02-09 DIAGNOSIS — Z5189 Encounter for other specified aftercare: Secondary | ICD-10-CM | POA: Diagnosis not present

## 2014-02-09 NOTE — Progress Notes (Signed)
Brandon Klein 73 y.o. male Nutrition Note Spoke with pt.  Nutrition Plan and Nutrition Survey goals reviewed with pt. Pt is working toward following the Therapeutic Lifestyle Changes (TLC) diet. Pt reports appetite decreased with surgery and is "finally improving." Barriers to following TLC diet include pt's wife does not purchase/eat fruits/vegetables. Pt states "I guess I'm going to have to buy them." Pt wants to lose wt. Pt has been trying to lose wt by changing his diet. Pt eats 2 meals/day. Wt loss tips reviewed.  Pt expressed understanding of the information reviewed. Pt aware of nutrition education classes offered.  Nutrition Diagnosis   Food-and nutrition-related knowledge deficit related to lack of exposure to information as related to diagnosis of: ? CVD    Obesity related to excessive energy intake as evidenced by a BMI of 42.1  Nutrition RX/ Estimated Daily Nutrition Needs for: wt loss  1700-2200 Kcal, 45-60 gm fat, 11-15 gm sat fat, 1.6-2.2 gm trans-fat, <1500 mg sodium Nutrition Intervention   Pt's individual nutrition plan reviewed with pt.   Benefits of adopting Therapeutic Lifestyle Changes discussed when Medficts reviewed.   Pt to attend the Portion Distortion class   Pt to attend the  ? Nutrition I class - met; 01/27/14                    ? Nutrition II class - met; 02/03/14   Continue client-centered nutrition education by RD, as part of interdisciplinary care. Goal(s)   Pt to identify and limit food sources of saturated fat, trans fat, and cholesterol   Pt to identify food quantities necessary to achieve: ? wt loss to a goal wt of 244-262 lb (111.1-119.3 kg) at graduation from cardiac rehab.  Monitor and Evaluate progress toward nutrition goal with team. Derek Mound, M.Ed, RD, LDN, CDE 02/09/2014 2:15 PM

## 2014-02-11 ENCOUNTER — Encounter (HOSPITAL_COMMUNITY)
Admission: RE | Admit: 2014-02-11 | Discharge: 2014-02-11 | Disposition: A | Discharge status: Home / Self Care | Payer: Medicare Other | Source: Ambulatory Visit | Attending: Cardiology | Admitting: Cardiology

## 2014-02-11 DIAGNOSIS — Z5189 Encounter for other specified aftercare: Secondary | ICD-10-CM | POA: Diagnosis not present

## 2014-02-12 ENCOUNTER — Other Ambulatory Visit: Payer: Self-pay | Admitting: General Surgery

## 2014-02-12 DIAGNOSIS — Z79899 Other long term (current) drug therapy: Secondary | ICD-10-CM

## 2014-02-12 MED ORDER — MAGNESIUM OXIDE 250 MG PO TABS: 500.0000 mg | ORAL_TABLET | Freq: Two times a day (BID) | ORAL | Status: DC

## 2014-02-13 ENCOUNTER — Ambulatory Visit (INDEPENDENT_AMBULATORY_CARE_PROVIDER_SITE_OTHER): Payer: Medicare Other | Admitting: *Deleted

## 2014-02-13 ENCOUNTER — Encounter (HOSPITAL_COMMUNITY)
Admission: RE | Admit: 2014-02-13 | Discharge: 2014-02-13 | Disposition: A | Discharge status: Home / Self Care | Payer: Medicare Other | Source: Ambulatory Visit | Attending: Cardiology | Admitting: Cardiology

## 2014-02-13 DIAGNOSIS — Z5189 Encounter for other specified aftercare: Secondary | ICD-10-CM | POA: Diagnosis not present

## 2014-02-13 DIAGNOSIS — I4891 Unspecified atrial fibrillation: Secondary | ICD-10-CM

## 2014-02-13 LAB — POCT INR: INR: 2.1

## 2014-02-13 NOTE — Progress Notes (Signed)
PSYCHOSOCIAL ASSESSMENT  Pt psychosocial assessment reveals no barriers to rehab participation.  Pt quality of life is slightly altered by his physical constraints which limits his ability to perform tasks as prior to his illness.  Pt is disappointed that he continues to have significant fatigue and dyspnea on exertion. Pt reports the symptoms only subtle improvement with rehab activities.  Pt is looking forward to regaining his strength and stamina.  Pt exhibits positive coping skills and has supportive family.  Offered emotional support and reassurance.  Will continue to monitor.

## 2014-02-16 ENCOUNTER — Encounter: Payer: Self-pay | Admitting: Internal Medicine

## 2014-02-16 ENCOUNTER — Encounter (HOSPITAL_COMMUNITY)
Admission: RE | Admit: 2014-02-16 | Discharge: 2014-02-16 | Disposition: A | Discharge status: Home / Self Care | Payer: Medicare Other | Source: Ambulatory Visit | Attending: Cardiology | Admitting: Cardiology

## 2014-02-16 DIAGNOSIS — Z5189 Encounter for other specified aftercare: Secondary | ICD-10-CM | POA: Diagnosis not present

## 2014-02-18 ENCOUNTER — Encounter (HOSPITAL_COMMUNITY)
Admission: RE | Admit: 2014-02-18 | Discharge: 2014-02-18 | Disposition: A | Discharge status: Home / Self Care | Payer: Medicare Other | Source: Ambulatory Visit | Attending: Cardiology | Admitting: Cardiology

## 2014-02-18 DIAGNOSIS — Z5189 Encounter for other specified aftercare: Secondary | ICD-10-CM | POA: Diagnosis not present

## 2014-02-19 ENCOUNTER — Ambulatory Visit (HOSPITAL_BASED_OUTPATIENT_CLINIC_OR_DEPARTMENT_OTHER): Payer: Medicare Other | Attending: Cardiology

## 2014-02-19 VITALS — Ht 67.0 in | Wt 260.0 lb

## 2014-02-19 DIAGNOSIS — Z6841 Body Mass Index (BMI) 40.0 and over, adult: Secondary | ICD-10-CM | POA: Diagnosis not present

## 2014-02-19 DIAGNOSIS — G4733 Obstructive sleep apnea (adult) (pediatric): Secondary | ICD-10-CM | POA: Insufficient documentation

## 2014-02-19 DIAGNOSIS — G4710 Hypersomnia, unspecified: Secondary | ICD-10-CM | POA: Diagnosis present

## 2014-02-19 DIAGNOSIS — R0681 Apnea, not elsewhere classified: Secondary | ICD-10-CM

## 2014-02-19 DIAGNOSIS — G4761 Periodic limb movement disorder: Secondary | ICD-10-CM | POA: Insufficient documentation

## 2014-02-20 ENCOUNTER — Encounter (HOSPITAL_COMMUNITY): Payer: Medicare Other

## 2014-02-20 ENCOUNTER — Encounter: Payer: Self-pay | Admitting: Cardiology

## 2014-02-23 ENCOUNTER — Telehealth: Payer: Self-pay | Admitting: Cardiology

## 2014-02-23 ENCOUNTER — Encounter (HOSPITAL_COMMUNITY): Payer: Medicare Other

## 2014-02-23 NOTE — Telephone Encounter (Signed)
Please let patient know that he has severe OSA.  He was not adequately titrated on CPAP during split night study.  Please set up repeat study with full night CPAP titration with possible BiPAP titration.

## 2014-02-23 NOTE — Sleep Study (Addendum)
     PATIENT NAME:  Brandon Klein DATE OF BIRTH:  04/07/1941 MEDICAL RECORD NUMBER [id number] LOCATION:  [facility] READING PHYSICIAN:  Armanda Magic, MD REFERRING PHYSICIAN:  Armanda Magic, MD DATE OF STUDY:  02/19/2014  SLEEP STUDY TYPE:  Split night nocturnal polysomnogram  INDICATION FOR EXAM:  Hypersomnia, waking up at night, Morbid obesity  EPWORTH SLEEPINESS SCORE:  3 HEIGHT: 5'7" WEIGHT:  260lbs NECK SIZE: 18" BMI 41  MEDICATIONS:  Reviewed in the sleep record  SLEEP ARCHITECTURE:  The patient had a total sleep time of 140 minutes during PSG and 155 minutes during the CPAP titration.  There was no REM sleep and only 0.5 minutes of slow wave sleep during the PSG.  There was no REM or slow wave sleep during CPAP titration.  The sleep onset latency was 10 minutes.  The sleep efficiency was 74% during the PSG and 70% during CPAP titration.    OXYGEN DATA:  There were oxygen desaturations as low as 83% during PSG and as low as 84% during CPAP titration.  The lowest O2 sat was seen in the awake state at 71% transiently.    CARDIAC DATA:  The patient maintained NSR throughout the study.  RESPIRATORY DATA:  The patient was found to have 112 apneas and 53 hypopneas during the PSG for an overall AHI of 70.7 per hour.  The patient was titrated on Cpap starting at 5cm H2O which was increased for respiratory events to 15cm H2O.  The patient tolerated the CPAP and was able to sleep in the supine position but did not achieve REM sleep.  The AHI was 12.9 per hour on 15cm H2O and therefore was not adequately titrated.  MOVEMENT/PARASOMINA:  The patient had a periodic limb movement index of 31.3 but no significant arousals with the movements.  IMPRESSION: 1.  Severe obstructive sleep apnea/hypopnea syndrome with an overall AHI of 70 per hour.  The events more occurred more frequently in the nonsupine position. 2.  Morbid Obesity 3.  Reduced sleep efficiency with increased  frequency of arousals due to respiratory events. 4.  No REM sleep noted  RECOMMENDATION: 1.  Since the patient could not be adequately titrated during the split night study, he should be brought back for full night CPAP titration with possible addition of BIPAP if he cannot be adequately titrated on CPAP.   2.  The patient should be counseled on weight loss 3.  Proceed with expeditious CPAP titration.   Signed: Armanda Magic, MD Diplomate, American Board of Sleep Medicine [facility]

## 2014-02-24 ENCOUNTER — Other Ambulatory Visit: Payer: Self-pay | Admitting: General Surgery

## 2014-02-24 DIAGNOSIS — G4733 Obstructive sleep apnea (adult) (pediatric): Secondary | ICD-10-CM

## 2014-02-24 NOTE — Telephone Encounter (Signed)
Ordered CAP Titration and will send to Scheduling for pt to be called.

## 2014-02-24 NOTE — Telephone Encounter (Signed)
LMTRC

## 2014-02-25 ENCOUNTER — Encounter (HOSPITAL_COMMUNITY): Payer: Medicare Other

## 2014-02-25 NOTE — Telephone Encounter (Signed)
Pt stated there is no way he has apnea and stopped breathing a lot. He is not wanting to use a machine or go back for titration. I cancelled the titration. Pt said he was coming in for a test on Friday so he would discuss it with me then. I explained that both Dr [name] and I are not in the office that day. He then stated he will just talk to [place] later then and hung up the phone.   TO Dr [name]

## 2014-02-25 NOTE — Telephone Encounter (Signed)
I have reviewed the findings of the sleep study with the patient at length and informed him of the severe nature of his apnea.  His AHI was 70/hr.  I told him that left untreated he is at risk of developing recurrent afib, CHF, DM and worsening CAD.  I have recommended that he proceed with full night CPAP/Bipap titration.  Please set up for study as patient has agreed to proceed.

## 2014-02-26 NOTE — Telephone Encounter (Signed)
Sent to scheduling to set pt up for CPAP Titration.

## 2014-02-27 ENCOUNTER — Encounter (HOSPITAL_COMMUNITY)
Admission: RE | Admit: 2014-02-27 | Discharge: 2014-02-27 | Disposition: A | Discharge status: Home / Self Care | Payer: Medicare Other | Source: Ambulatory Visit | Attending: Cardiology | Admitting: Cardiology

## 2014-02-27 ENCOUNTER — Ambulatory Visit (INDEPENDENT_AMBULATORY_CARE_PROVIDER_SITE_OTHER): Payer: Medicare Other | Admitting: *Deleted

## 2014-02-27 ENCOUNTER — Other Ambulatory Visit (INDEPENDENT_AMBULATORY_CARE_PROVIDER_SITE_OTHER): Payer: Medicare Other | Admitting: *Deleted

## 2014-02-27 DIAGNOSIS — Z951 Presence of aortocoronary bypass graft: Secondary | ICD-10-CM | POA: Insufficient documentation

## 2014-02-27 DIAGNOSIS — Z79899 Other long term (current) drug therapy: Secondary | ICD-10-CM

## 2014-02-27 DIAGNOSIS — I4891 Unspecified atrial fibrillation: Secondary | ICD-10-CM

## 2014-02-27 LAB — MAGNESIUM: MAGNESIUM: 1.7 mg/dL (ref 1.5–2.5)

## 2014-02-27 LAB — BASIC METABOLIC PANEL
BUN: 20 mg/dL (ref 6–23)
CALCIUM: 8.9 mg/dL (ref 8.4–10.5)
CO2: 27 mEq/L (ref 19–32)
CREATININE: 1.1 mg/dL (ref 0.4–1.5)
Chloride: 101 mEq/L (ref 96–112)
GFR: 67.57 mL/min (ref >60.00)
Glucose, Bld: 85 mg/dL (ref 70–99)
Potassium: 3.8 mEq/L (ref 3.5–5.1)
Sodium: 136 mEq/L (ref 135–145)

## 2014-02-27 LAB — POCT INR: INR: 2.2

## 2014-03-02 ENCOUNTER — Telehealth: Payer: Self-pay | Admitting: Cardiology

## 2014-03-02 ENCOUNTER — Encounter (HOSPITAL_COMMUNITY)
Admission: RE | Admit: 2014-03-02 | Discharge: 2014-03-02 | Disposition: A | Discharge status: Home / Self Care | Payer: Medicare Other | Source: Ambulatory Visit | Attending: Cardiology | Admitting: Cardiology

## 2014-03-02 DIAGNOSIS — Z79899 Other long term (current) drug therapy: Secondary | ICD-10-CM

## 2014-03-02 DIAGNOSIS — Z951 Presence of aortocoronary bypass graft: Secondary | ICD-10-CM | POA: Diagnosis not present

## 2014-03-02 NOTE — Telephone Encounter (Signed)
Labs ordered.

## 2014-03-02 NOTE — Telephone Encounter (Signed)
Message copied by Theda Sers on Mon Mar 02, 2014  9:11 AM ------      Message from: Armanda Magic R      Created: Fri Feb 27, 2014 10:00 PM       Stable labs - continue current meds and recheck again in 2 weeks ------

## 2014-03-04 ENCOUNTER — Encounter (HOSPITAL_COMMUNITY)
Admission: RE | Admit: 2014-03-04 | Discharge: 2014-03-04 | Disposition: A | Discharge status: Home / Self Care | Payer: Medicare Other | Source: Ambulatory Visit | Attending: Cardiology | Admitting: Cardiology

## 2014-03-04 ENCOUNTER — Telehealth: Payer: Self-pay | Admitting: Cardiology

## 2014-03-04 DIAGNOSIS — Z951 Presence of aortocoronary bypass graft: Secondary | ICD-10-CM | POA: Diagnosis not present

## 2014-03-04 NOTE — Telephone Encounter (Signed)
New message      Pt is at cardiac rehab---he is having sob

## 2014-03-04 NOTE — Progress Notes (Signed)
Pt arrived at [facility] rehab today c/o dyspnea off and on over past 1-2 weeks, primarily since his sleep study.  Pt reports 2 nights ago he awoke short of breath, symptoms relieved with sitting up. Pt denies pain, edema, cough, fever or change in medication/dietary routine.  Pt lungs clear, no edema, weight stable.  O2 sat-98%.  pc to Dr. Norris Cross triage nurse to report symptoms and frequent ventricular ectopy, bigeminal PVC with couplets.  Rhythm strips faxed for Dr. [name] to review.  Triage nurse will discuss pt symptoms with Dr. [name] and further advise. Pt able to exercise today without difficulty or dyspnea.

## 2014-03-04 NOTE — Telephone Encounter (Signed)
Per Chyrl Civatte at Cardiac Rehab - pt came in today c/o increased SOB over the past week.  Monday night it woke him from his sleep and he had to sit up to be able to breathe.  02 sat is 98%, no edema, wt stable.  Pt is having bigeminal PVCs with couplets today.  Aware I will forward information to MD for review and pt will be called back with further instructions.  Chyrl Civatte states understanding and will fax over strips from today.

## 2014-03-05 ENCOUNTER — Ambulatory Visit (INDEPENDENT_AMBULATORY_CARE_PROVIDER_SITE_OTHER): Payer: Medicare Other | Admitting: *Deleted

## 2014-03-05 ENCOUNTER — Ambulatory Visit (INDEPENDENT_AMBULATORY_CARE_PROVIDER_SITE_OTHER): Payer: Medicare Other | Admitting: Physician Assistant

## 2014-03-05 ENCOUNTER — Encounter: Payer: Self-pay | Admitting: Physician Assistant

## 2014-03-05 VITALS — BP 126/78 | HR 72 | Ht 67.0 in | Wt 264.0 lb

## 2014-03-05 DIAGNOSIS — Z9861 Coronary angioplasty status: Secondary | ICD-10-CM

## 2014-03-05 DIAGNOSIS — I5032 Chronic diastolic (congestive) heart failure: Secondary | ICD-10-CM

## 2014-03-05 DIAGNOSIS — I10 Essential (primary) hypertension: Secondary | ICD-10-CM

## 2014-03-05 DIAGNOSIS — G4733 Obstructive sleep apnea (adult) (pediatric): Secondary | ICD-10-CM | POA: Insufficient documentation

## 2014-03-05 DIAGNOSIS — I6529 Occlusion and stenosis of unspecified carotid artery: Secondary | ICD-10-CM | POA: Insufficient documentation

## 2014-03-05 DIAGNOSIS — E876 Hypokalemia: Secondary | ICD-10-CM | POA: Insufficient documentation

## 2014-03-05 DIAGNOSIS — I493 Ventricular premature depolarization: Secondary | ICD-10-CM

## 2014-03-05 DIAGNOSIS — R0602 Shortness of breath: Secondary | ICD-10-CM

## 2014-03-05 DIAGNOSIS — Z950 Presence of cardiac pacemaker: Secondary | ICD-10-CM

## 2014-03-05 DIAGNOSIS — I5042 Chronic combined systolic (congestive) and diastolic (congestive) heart failure: Secondary | ICD-10-CM | POA: Insufficient documentation

## 2014-03-05 DIAGNOSIS — R001 Bradycardia, unspecified: Secondary | ICD-10-CM

## 2014-03-05 DIAGNOSIS — I480 Paroxysmal atrial fibrillation: Secondary | ICD-10-CM

## 2014-03-05 DIAGNOSIS — I2510 Atherosclerotic heart disease of native coronary artery without angina pectoris: Secondary | ICD-10-CM

## 2014-03-05 LAB — MDC_IDC_ENUM_SESS_TYPE_INCLINIC
Battery Voltage: 2.79 V
Brady Statistic AS VP Percent: 3 %
Brady Statistic AS VS Percent: 67 %
Lead Channel Impedance Value: 714 Ohm
Lead Channel Pacing Threshold Amplitude: 0.375 V
Lead Channel Pacing Threshold Pulse Width: 0.4 ms
Lead Channel Sensing Intrinsic Amplitude: 16 mV
Lead Channel Setting Pacing Amplitude: 2 V
Lead Channel Setting Sensing Sensitivity: 5.6 mV
MDC IDC MSMT BATTERY IMPEDANCE: 135 Ohm
MDC IDC MSMT BATTERY REMAINING LONGEVITY: 144 mo
MDC IDC MSMT LEADCHNL RA IMPEDANCE VALUE: 419 Ohm
MDC IDC MSMT LEADCHNL RA PACING THRESHOLD PULSEWIDTH: 0.4 ms
MDC IDC MSMT LEADCHNL RA SENSING INTR AMPL: 1.4 mV
MDC IDC MSMT LEADCHNL RV PACING THRESHOLD AMPLITUDE: 0.875 V
MDC IDC SESS DTM: 20151008103800
MDC IDC SET LEADCHNL RV PACING AMPLITUDE: 2.5 V
MDC IDC SET LEADCHNL RV PACING PULSEWIDTH: 0.4 ms
MDC IDC STAT BRADY AP VP PERCENT: 1 %
MDC IDC STAT BRADY AP VS PERCENT: 29 %

## 2014-03-05 LAB — CBC
HCT: 37.9 % — ABNORMAL LOW (ref 39.0–52.0)
Hemoglobin: 11.9 g/dL — ABNORMAL LOW (ref 13.0–17.0)
MCHC: 31.4 g/dL (ref 30.0–36.0)
MCV: 75.9 fl — ABNORMAL LOW (ref 78.0–100.0)
PLATELETS: 197 10*3/uL (ref 150.0–400.0)
RBC: 5 Mil/uL (ref 4.22–5.81)
RDW: 17.3 % — AB (ref 11.5–15.5)
WBC: 5.6 10*3/uL (ref 4.0–10.5)

## 2014-03-05 LAB — BASIC METABOLIC PANEL
BUN: 16 mg/dL (ref 6–23)
CALCIUM: 9.2 mg/dL (ref 8.4–10.5)
CO2: 25 mEq/L (ref 19–32)
Chloride: 104 mEq/L (ref 96–112)
Creatinine, Ser: 1.1 mg/dL (ref 0.4–1.5)
GFR: 70.44 mL/min (ref >60.00)
Glucose, Bld: 121 mg/dL — ABNORMAL HIGH (ref 70–99)
Potassium: 3.8 mEq/L (ref 3.5–5.1)
SODIUM: 140 meq/L (ref 135–145)

## 2014-03-05 LAB — BRAIN NATRIURETIC PEPTIDE: PRO B NATRI PEPTIDE: 344 pg/mL — AB (ref 0.0–100.0)

## 2014-03-05 LAB — MAGNESIUM: MAGNESIUM: 1.8 mg/dL (ref 1.5–2.5)

## 2014-03-05 NOTE — Progress Notes (Signed)
[address] [place], [place]  [id number] Phone: [phone] Fax:  [phone]  Date:  03/05/2014   Patient ID:  Brandon Klein, DOB 11/11/1940, MRN [id number]   PCP:  Gaye AlkenBARNES,ELIZABETH STEWART, MD  Cardiologist:  Dr. Mayford Knifeurner  History of Present Illness: Brandon Klein is a 73 y.o. male with history of CAD (stent to LAD 1997, CBA to LCx 2002, s/p CABGx4 11/2013), mild AS by echo 10/2013, dyslipidemia, PAF (preceding CABG, with recurrence post-op treated with amiodarone which was stopped 01/28/14), volume overload treated with Lasix post-CABG (chronic diastolic CHF, L pleural effusion), remote syncope/bradycardia s/p Medtronic pacemaker, HTN, and recently diagnosed severe OSA who presents for follow-up. He was also found to have PVCs in 12/2013 on the monitor at cardiac rehab - K was 3.3, Mg was 1.4 and he was started on supplementation.   Overall he has been doing well since bypass. He has had SOB in the past that was felt multifactorial. He is now on daily Lasix. He has been working at cardiac rehab without any difficulty recently. No exertional chest pain or dyspnea. At cardiac rehab the other day he happen to report recent eipsodes of dyspnea. He corrected me and said he didn't really feel SOB, just that he couldn't quite get a breath. He says about 2 weeks ago he tried to use his incentive spirometer and felt like he wasn't able to blow it as hard as he previously could. He also has had restless sleep but this is longstanding - he gets up multiple times to urinate or occasionally turns on the TV. He denies orthopnea. Over the last week this has continued to improve. He no longer feels SOB. He currently feels fine and says he is here because our office told him to come in.  Recent monitor at cardiac rehab again had demonstrated PVCs with occasional couplets. He does report occasional palpitation to correlate with these but is not particularly bothered by them. Medtronic pacemaker  interrogation today showed 2% AF only around the time of his CABG but none since that time, otherwise no significant events; normal pacemaker function.  Echo 10/2013 Study Conclusions - Left ventricle: The cavity size was mildly dilated. Wall thickness was normal. Systolic function was normal. The estimated ejection fraction was in the range of 55% to 60%. Doppler parameters are consistent with abnormal left ventricular relaxation (grade 1 diastolic dysfunction). - Aortic valve: AV is thickened, calcified with restricted motion. Peak and mean gradients through the valve are 27 and 17 mm Hg respectively consistent with mild AS> Mean gradient (S): 16 mm Hg. Peak gradient (S): 28 mm Hg. - Left atrium: The atrium was moderately dilated.  Recent Labs: 07/14/2013: Direct LDL 47.9; HDL Cholesterol by NMR 36.10*  11/27/2013: TSH 1.350  12/05/2013: Hemoglobin 8.9*  01/15/2014: ALT 17; LDL (calc) 42  02/27/2014: Creatinine 1.1; Potassium 3.8   Wt Readings from Last 3 Encounters:  03/05/14 264 lb (119.75 kg)  02/19/14 260 lb (117.935 kg)  02/04/14 266 lb (120.657 kg)     Past Medical History  Diagnosis Date  . Syncope     a. vasovagal with documented bradycardia.  . Pacemaker     a. 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 b. MDT generator replacement 2012.  . Obesity   . HTN (hypertension)   . HLD (hyperlipidemia)   . DJD (degenerative joint disease)   . GERD (gastroesophageal reflux disease)   . Impaired fasting glucose  A1C check every year  . PAF (paroxysmal atrial fibrillation)     on chronic systemic anticoagulation  . Coronary artery disease     a. s/p PCI of LAD 1997. b. Cutting balloon angioplasty to LCx in 2002. c. s/p cath 11/2013 with severe 3 vessel ASCAD s/p CABG with LIMA to LAD, SVG to diag, SVG to left circ and SVG to PDA.  Marland Kitchen Postoperative atrial fibrillation   . AS (aortic stenosis)     mild by echo 10/2013  . Hypokalemia   . Hypomagnesemia   .  Carotid artery plaque     a. Duplex 10/2013: mild calcific plaque origin ICA. Left: intimal wall thickening CCA. 0-39% BICA.  Marland Kitchen PVC's (premature ventricular contractions)   . OSA (obstructive sleep apnea)   . Chronic diastolic CHF (congestive heart failure)     Current Outpatient Prescriptions  Medication Sig Dispense Refill  . amLODipine (NORVASC) 5 MG tablet Take 1 tablet (5 mg total) by mouth daily.  180 tablet  3  . aspirin EC 81 MG EC tablet Take 1 tablet (81 mg total) by mouth daily.      Marland Kitchen atorvastatin (LIPITOR) 20 MG tablet Take 20 mg by mouth daily.        . cholecalciferol (GNP VITAMIN D) 400 UNITS TABS Take 400 Units by mouth daily.        . fexofenadine (ALLEGRA) 180 MG tablet Take 180 mg by mouth daily.        . fluticasone (FLONASE) 50 MCG/ACT nasal spray Place 2 sprays into the nose daily.        . furosemide (LASIX) 40 MG tablet Take 1 tablet (40 mg total) by mouth daily. For 7 Days  30 tablet  11  . Glucosamine-Chondroit-Vit C-Mn (GLUCOSAMINE CHONDR 1500 COMPLX) CAPS Take 1 capsule by mouth daily.       Marland Kitchen L-Lysine 500 MG CAPS Take 1 capsule by mouth daily.        . Magnesium Oxide 250 MG TABS Take 2 tablets (500 mg total) by mouth 2 (two) times daily.      . meclizine (ANTIVERT) 25 MG tablet Take 25 mg by mouth once.       . metoprolol tartrate (LOPRESSOR) 25 MG tablet Take 1 tablet (25 mg total) by mouth 2 (two) times daily.  60 tablet  3  . Multiple Vitamin (MULTIVITAMIN) tablet Take 1 tablet by mouth daily.        . nitroGLYCERIN (NITROSTAT) 0.4 MG SL tablet Place 1 tablet (0.4 mg total) under the tongue every 5 (five) minutes as needed for chest pain.  30 tablet  3  . Omega-3 Fatty Acids (FISH OIL) 500 MG CAPS Take 1,000 mg by mouth 2 (two) times daily.      . ONE TOUCH ULTRA TEST test strip       . ONETOUCH DELICA LANCETS FINE MISC       . oxyCODONE (OXY IR/ROXICODONE) 5 MG immediate release tablet Take 1-2 tablets (5-10 mg total) by mouth every 3 (three) hours as  needed for moderate pain.  30 tablet  0  . pantoprazole (PROTONIX) 40 MG tablet Take 40 mg by mouth daily.        . potassium chloride SA (K-DUR,KLOR-CON) 20 MEQ tablet Take 2 tablets (40 mEq total) by mouth 2 (two) times daily.  120 tablet  6  . warfarin (COUMADIN) 5 MG tablet Take 5 mg by mouth as directed. 7.5mg  Mon, 5mg  AOD  No current facility-administered medications for this visit.    Allergies:   Acetaminophen-codeine; Amoxicillin; and Diovan   Social History:  The patient  reports that he quit smoking about 47 years ago. He has never used smokeless tobacco. He reports that he drinks alcohol. He reports that he does not use illicit drugs.   Family History:  The patient's family history includes Colon cancer in his mother; Heart attack in his brother and mother; Leukemia in his father.   ROS:  Please see the history of present illness.  All other systems reviewed and negative.   PHYSICAL EXAM:  VS:  BP 126/78  Pulse 72  Ht 5\' 7"  (1.702 m)  Wt 264 lb (119.75 kg)  BMI 41.34 kg/m2 Well nourished, well developed WM in no acute distress HEENT: normal Neck: no JVD Cardiac:  normal S1, S2; RRR; no murmur, well healed sternal scar Lungs:  clear to auscultation bilaterally, no wheezing, rhonchi or rales Abd: soft, nontender, no hepatomegaly Ext: no edema Skin: warm and dry Neuro:  moves all extremities spontaneously, no focal abnormalities noted  EKG:  NSR, left axis deviation, TWI I, avL, flattening in V5-V6; no significant change from prior  ASSESSMENT AND PLAN:  1. Shortness of breath - continues to improve. Might have been mild volume overload. Will check pBNP to assess volume status. If it's up, I will consider having him take an extra Lasix for 2 days since weight [place] up a little bit by our scale. (He reports that it's stable by his home scale.) I will also check a CBC to ensure improvement of his Hgb since surgery in July since he is on ASA/Coumadin. He was also due to  come in later this week for f/u BMET and Mg; will draw them today since he is here and getting labwork. He is to call for return of symptoms. 2. Chronic diastolic CHF - as above. 3. PVCs - checking lytes today. 4. CAD s/p prior PCI, CABG 11/2013 - no recurrent angina. Will defer duration of ASA to Dr. [name] since he is also on Coumadin. 5. Paroxysmal atrial fibrillation - maintaining NSR. 6. Mild aortic stenosis - timing of future echo to be determined by Dr. [name]. 7. HTN - controlled. 8. Bradycardia s/p Medtronic pacemaker - normal function. 9. OSA - he has f/u sleep study scheduled for titration.  Dispo: F/u as previously planned with Dr. [name] (~5 months now).  Signed, Ronie Spies, PA-C  03/05/2014 11:14 AM

## 2014-03-05 NOTE — Telephone Encounter (Signed)
Patient needs to be seen in Office today for evaluation

## 2014-03-05 NOTE — Progress Notes (Signed)
Pacemaker check in clinic for c/o SOB. Normal device function. Thresholds, sensing, impedances consistent with previous measurements. Device programmed to maximize longevity. 130 mode switches all before his CABG in July, + coumadin.  Total burden 2%.  13 high ventricular rates noted again all before his July CABG. Device programmed at appropriate safety margins. Histogram distribution appropriate for patient activity level. Device programmed to optimize intrinsic conduction. Estimated longevity 12 years. Patient enrolled in remote follow-up/TTM's with [facility]. Plan to follow every 3 months remotely and see annually in office. Patient education completed.  Follow up as scheduled with [facility] 04/15/14.

## 2014-03-05 NOTE — Telephone Encounter (Signed)
Appt today at 10AM, pt advised.

## 2014-03-05 NOTE — Patient Instructions (Signed)
Your physician recommends that you continue on your current medications as directed. Please refer to the Current Medication list given to you today.    Your physician recommends that you return for lab work in: BMET BNP CBC MAGNESIUM

## 2014-03-06 ENCOUNTER — Encounter (HOSPITAL_COMMUNITY)
Admission: RE | Admit: 2014-03-06 | Discharge: 2014-03-06 | Disposition: A | Discharge status: Home / Self Care | Payer: Medicare Other | Source: Ambulatory Visit | Attending: Cardiology | Admitting: Cardiology

## 2014-03-06 DIAGNOSIS — Z951 Presence of aortocoronary bypass graft: Secondary | ICD-10-CM | POA: Diagnosis not present

## 2014-03-09 ENCOUNTER — Encounter (HOSPITAL_COMMUNITY)
Admission: RE | Admit: 2014-03-09 | Discharge: 2014-03-09 | Disposition: A | Discharge status: Home / Self Care | Payer: Medicare Other | Source: Ambulatory Visit | Attending: Cardiology | Admitting: Cardiology

## 2014-03-09 ENCOUNTER — Telehealth: Payer: Self-pay | Admitting: Cardiology

## 2014-03-09 DIAGNOSIS — I493 Ventricular premature depolarization: Secondary | ICD-10-CM

## 2014-03-09 DIAGNOSIS — Z951 Presence of aortocoronary bypass graft: Secondary | ICD-10-CM | POA: Diagnosis not present

## 2014-03-09 DIAGNOSIS — D649 Anemia, unspecified: Secondary | ICD-10-CM

## 2014-03-09 NOTE — Telephone Encounter (Signed)
Future labs ordered.  

## 2014-03-11 ENCOUNTER — Encounter (HOSPITAL_COMMUNITY)
Admission: RE | Admit: 2014-03-11 | Discharge: 2014-03-11 | Disposition: A | Discharge status: Home / Self Care | Payer: Medicare Other | Source: Ambulatory Visit | Attending: Cardiology | Admitting: Cardiology

## 2014-03-11 DIAGNOSIS — Z951 Presence of aortocoronary bypass graft: Secondary | ICD-10-CM | POA: Diagnosis not present

## 2014-03-13 ENCOUNTER — Other Ambulatory Visit (INDEPENDENT_AMBULATORY_CARE_PROVIDER_SITE_OTHER): Payer: Medicare Other | Admitting: *Deleted

## 2014-03-13 ENCOUNTER — Encounter (HOSPITAL_COMMUNITY)
Admission: RE | Admit: 2014-03-13 | Discharge: 2014-03-13 | Disposition: A | Discharge status: Home / Self Care | Payer: Medicare Other | Source: Ambulatory Visit | Attending: Cardiology | Admitting: Cardiology

## 2014-03-13 ENCOUNTER — Other Ambulatory Visit: Payer: Medicare Other

## 2014-03-13 DIAGNOSIS — Z951 Presence of aortocoronary bypass graft: Secondary | ICD-10-CM | POA: Diagnosis not present

## 2014-03-13 DIAGNOSIS — D649 Anemia, unspecified: Secondary | ICD-10-CM

## 2014-03-13 DIAGNOSIS — I493 Ventricular premature depolarization: Secondary | ICD-10-CM

## 2014-03-13 LAB — CBC
HCT: 34.1 % — ABNORMAL LOW (ref 39.0–52.0)
Hemoglobin: 10.8 g/dL — ABNORMAL LOW (ref 13.0–17.0)
MCHC: 31.6 g/dL (ref 30.0–36.0)
MCV: 74.6 fl — ABNORMAL LOW (ref 78.0–100.0)
Platelets: 190 10*3/uL (ref 150.0–400.0)
RBC: 4.58 Mil/uL (ref 4.22–5.81)
RDW: 18 % — ABNORMAL HIGH (ref 11.5–15.5)
WBC: 6.1 10*3/uL (ref 4.0–10.5)

## 2014-03-13 LAB — BASIC METABOLIC PANEL
BUN: 16 mg/dL (ref 6–23)
CALCIUM: 9 mg/dL (ref 8.4–10.5)
CO2: 24 mEq/L (ref 19–32)
Chloride: 103 mEq/L (ref 96–112)
Creatinine, Ser: 1.2 mg/dL (ref 0.4–1.5)
GFR: 64.91 mL/min (ref >60.00)
Glucose, Bld: 96 mg/dL (ref 70–99)
Potassium: 4 mEq/L (ref 3.5–5.1)
Sodium: 136 mEq/L (ref 135–145)

## 2014-03-13 LAB — MAGNESIUM: MAGNESIUM: 1.7 mg/dL (ref 1.5–2.5)

## 2014-03-16 ENCOUNTER — Encounter (HOSPITAL_COMMUNITY)
Admission: RE | Admit: 2014-03-16 | Discharge: 2014-03-16 | Disposition: A | Discharge status: Home / Self Care | Payer: Medicare Other | Source: Ambulatory Visit | Attending: Cardiology | Admitting: Cardiology

## 2014-03-16 ENCOUNTER — Other Ambulatory Visit: Payer: Medicare Other

## 2014-03-16 DIAGNOSIS — Z951 Presence of aortocoronary bypass graft: Secondary | ICD-10-CM | POA: Diagnosis not present

## 2014-03-18 ENCOUNTER — Encounter (HOSPITAL_COMMUNITY)
Admission: RE | Admit: 2014-03-18 | Discharge: 2014-03-18 | Disposition: A | Discharge status: Home / Self Care | Payer: Medicare Other | Source: Ambulatory Visit | Attending: Cardiology | Admitting: Cardiology

## 2014-03-18 DIAGNOSIS — Z951 Presence of aortocoronary bypass graft: Secondary | ICD-10-CM | POA: Diagnosis not present

## 2014-03-20 ENCOUNTER — Encounter (HOSPITAL_COMMUNITY)
Admission: RE | Admit: 2014-03-20 | Discharge: 2014-03-20 | Disposition: A | Discharge status: Home / Self Care | Payer: Medicare Other | Source: Ambulatory Visit | Attending: Cardiology | Admitting: Cardiology

## 2014-03-20 ENCOUNTER — Ambulatory Visit (INDEPENDENT_AMBULATORY_CARE_PROVIDER_SITE_OTHER): Payer: Medicare Other | Admitting: Pharmacist

## 2014-03-20 DIAGNOSIS — I4891 Unspecified atrial fibrillation: Secondary | ICD-10-CM

## 2014-03-20 DIAGNOSIS — Z951 Presence of aortocoronary bypass graft: Secondary | ICD-10-CM | POA: Diagnosis not present

## 2014-03-20 LAB — POCT INR: INR: 2.2

## 2014-03-23 ENCOUNTER — Encounter (HOSPITAL_COMMUNITY)
Admission: RE | Admit: 2014-03-23 | Discharge: 2014-03-23 | Disposition: A | Discharge status: Home / Self Care | Payer: Medicare Other | Source: Ambulatory Visit | Attending: Cardiology | Admitting: Cardiology

## 2014-03-23 DIAGNOSIS — Z951 Presence of aortocoronary bypass graft: Secondary | ICD-10-CM | POA: Diagnosis not present

## 2014-03-25 ENCOUNTER — Encounter (HOSPITAL_COMMUNITY)
Admission: RE | Admit: 2014-03-25 | Discharge: 2014-03-25 | Disposition: A | Discharge status: Home / Self Care | Payer: Medicare Other | Source: Ambulatory Visit | Attending: Cardiology | Admitting: Cardiology

## 2014-03-25 DIAGNOSIS — Z951 Presence of aortocoronary bypass graft: Secondary | ICD-10-CM | POA: Diagnosis not present

## 2014-03-27 ENCOUNTER — Encounter (HOSPITAL_COMMUNITY)
Admission: RE | Admit: 2014-03-27 | Discharge: 2014-03-27 | Disposition: A | Discharge status: Home / Self Care | Payer: Medicare Other | Source: Ambulatory Visit | Attending: Cardiology | Admitting: Cardiology

## 2014-03-27 DIAGNOSIS — Z951 Presence of aortocoronary bypass graft: Secondary | ICD-10-CM | POA: Diagnosis not present

## 2014-03-30 ENCOUNTER — Encounter (HOSPITAL_COMMUNITY)
Admission: RE | Admit: 2014-03-30 | Discharge: 2014-03-30 | Disposition: A | Discharge status: Home / Self Care | Payer: Medicare Other | Source: Ambulatory Visit | Attending: Cardiology | Admitting: Cardiology

## 2014-03-30 ENCOUNTER — Telehealth: Payer: Self-pay

## 2014-03-30 DIAGNOSIS — Z951 Presence of aortocoronary bypass graft: Secondary | ICD-10-CM | POA: Insufficient documentation

## 2014-03-30 NOTE — Telephone Encounter (Signed)
Randa Evens from cardiac rehabilitation called to inform of patient's SOB at rest.  He has been experiencing orthopnea. He exercises with no difficulties.  Per Randa Evens, pt's lungs are clear, his weight is stable, and vital signs are stable.   Randa Evens gave [name] the phone. Pt st his throat is dry, his eyes are scratchy, and he is hoarse. He st some of the breathing issues may be from allergies.  He st "sometimes I just can't get my breath." Patient wanted to know if he needed to be seen by Dr. [name] or perhaps see an allergist.   Routing to Dr. [name] for review and recommendations.

## 2014-03-30 NOTE — Progress Notes (Signed)
Pt arrived at cardiac rehab c/o persistent dyspnea.  Off and on.  Worse 2 days ago, somewhat better today. Pt reports it is worse with climbing steep hills (his driveway), lying down flat and sitting.  Pt also reports it is somewhat associated with post nasal drip and dry sore throat.Lungs clear, no significant edema, O2 sat-98%.    Pt recently seen by Ronie Spies, PA without significant improvement in symptoms.  PC to Dr. Norris Cross nurse to report symptoms.  She spoke with pt, will review symptoms with Dr. [name] and further advise pt. Lungs clear, no significant edema.    Pt verbalized understanding

## 2014-03-30 NOTE — Telephone Encounter (Signed)
Please have him schedule an appointment with his PCP

## 2014-03-31 NOTE — Telephone Encounter (Signed)
Spoke with patient and informed him of Dr. Norris Cross recommendations to see his PCp. He is agreeable and will call today.

## 2014-04-01 ENCOUNTER — Encounter (HOSPITAL_COMMUNITY): Payer: Medicare Other

## 2014-04-01 DIAGNOSIS — Z951 Presence of aortocoronary bypass graft: Secondary | ICD-10-CM | POA: Diagnosis not present

## 2014-04-02 ENCOUNTER — Encounter: Payer: Self-pay | Admitting: Internal Medicine

## 2014-04-02 ENCOUNTER — Emergency Department (HOSPITAL_COMMUNITY)
Admission: EM | Admit: 2014-04-02 | Discharge: 2014-04-02 | Disposition: A | Discharge status: Home / Self Care | Payer: Medicare Other | Attending: Emergency Medicine | Admitting: Emergency Medicine

## 2014-04-02 ENCOUNTER — Encounter (HOSPITAL_COMMUNITY): Payer: Self-pay | Admitting: Emergency Medicine

## 2014-04-02 ENCOUNTER — Emergency Department (HOSPITAL_COMMUNITY): Payer: Medicare Other

## 2014-04-02 DIAGNOSIS — Z9861 Coronary angioplasty status: Secondary | ICD-10-CM | POA: Diagnosis not present

## 2014-04-02 DIAGNOSIS — Z9884 Bariatric surgery status: Secondary | ICD-10-CM | POA: Diagnosis not present

## 2014-04-02 DIAGNOSIS — I10 Essential (primary) hypertension: Secondary | ICD-10-CM | POA: Insufficient documentation

## 2014-04-02 DIAGNOSIS — Z7982 Long term (current) use of aspirin: Secondary | ICD-10-CM | POA: Diagnosis not present

## 2014-04-02 DIAGNOSIS — R140 Abdominal distension (gaseous): Secondary | ICD-10-CM | POA: Insufficient documentation

## 2014-04-02 DIAGNOSIS — E785 Hyperlipidemia, unspecified: Secondary | ICD-10-CM | POA: Insufficient documentation

## 2014-04-02 DIAGNOSIS — R0602 Shortness of breath: Secondary | ICD-10-CM

## 2014-04-02 DIAGNOSIS — Z7951 Long term (current) use of inhaled steroids: Secondary | ICD-10-CM | POA: Diagnosis not present

## 2014-04-02 DIAGNOSIS — Z8669 Personal history of other diseases of the nervous system and sense organs: Secondary | ICD-10-CM | POA: Insufficient documentation

## 2014-04-02 DIAGNOSIS — Z7901 Long term (current) use of anticoagulants: Secondary | ICD-10-CM | POA: Insufficient documentation

## 2014-04-02 DIAGNOSIS — R05 Cough: Secondary | ICD-10-CM | POA: Insufficient documentation

## 2014-04-02 DIAGNOSIS — R011 Cardiac murmur, unspecified: Secondary | ICD-10-CM | POA: Insufficient documentation

## 2014-04-02 DIAGNOSIS — Z950 Presence of cardiac pacemaker: Secondary | ICD-10-CM | POA: Insufficient documentation

## 2014-04-02 DIAGNOSIS — K219 Gastro-esophageal reflux disease without esophagitis: Secondary | ICD-10-CM | POA: Diagnosis not present

## 2014-04-02 DIAGNOSIS — M1990 Unspecified osteoarthritis, unspecified site: Secondary | ICD-10-CM | POA: Diagnosis not present

## 2014-04-02 DIAGNOSIS — Z880 Allergy status to penicillin: Secondary | ICD-10-CM | POA: Diagnosis not present

## 2014-04-02 DIAGNOSIS — Z79899 Other long term (current) drug therapy: Secondary | ICD-10-CM | POA: Insufficient documentation

## 2014-04-02 DIAGNOSIS — I480 Paroxysmal atrial fibrillation: Secondary | ICD-10-CM | POA: Diagnosis not present

## 2014-04-02 DIAGNOSIS — I5032 Chronic diastolic (congestive) heart failure: Secondary | ICD-10-CM | POA: Insufficient documentation

## 2014-04-02 DIAGNOSIS — R0609 Other forms of dyspnea: Secondary | ICD-10-CM | POA: Insufficient documentation

## 2014-04-02 DIAGNOSIS — I2510 Atherosclerotic heart disease of native coronary artery without angina pectoris: Secondary | ICD-10-CM | POA: Insufficient documentation

## 2014-04-02 DIAGNOSIS — Z87891 Personal history of nicotine dependence: Secondary | ICD-10-CM | POA: Diagnosis not present

## 2014-04-02 DIAGNOSIS — Z951 Presence of aortocoronary bypass graft: Secondary | ICD-10-CM | POA: Diagnosis not present

## 2014-04-02 LAB — COMPREHENSIVE METABOLIC PANEL
ALBUMIN: 3.6 g/dL (ref 3.5–5.2)
ALT: 48 U/L (ref 0–53)
AST: 47 U/L — AB (ref 0–37)
Alkaline Phosphatase: 91 U/L (ref 39–117)
Anion gap: 15 (ref 5–15)
BILIRUBIN TOTAL: 0.7 mg/dL (ref 0.3–1.2)
BUN: 19 mg/dL (ref 6–23)
CALCIUM: 8.8 mg/dL (ref 8.4–10.5)
CHLORIDE: 105 meq/L (ref 96–112)
CO2: 22 mEq/L (ref 19–32)
Creatinine, Ser: 1.11 mg/dL (ref 0.50–1.35)
GFR calc Af Amer: 74 mL/min — ABNORMAL LOW (ref >90)
GFR calc non Af Amer: 64 mL/min — ABNORMAL LOW (ref >90)
Glucose, Bld: 156 mg/dL — ABNORMAL HIGH (ref 70–99)
Potassium: 4.6 mEq/L (ref 3.7–5.3)
Sodium: 142 mEq/L (ref 137–147)
Total Protein: 6.7 g/dL (ref 6.0–8.3)

## 2014-04-02 LAB — CBC WITH DIFFERENTIAL/PLATELET
BASOS ABS: 0.1 10*3/uL (ref 0.0–0.1)
Basophils Relative: 1 % (ref 0–1)
Eosinophils Absolute: 0.1 10*3/uL (ref 0.0–0.7)
Eosinophils Relative: 2 % (ref 0–5)
HCT: 32.9 % — ABNORMAL LOW (ref 39.0–52.0)
Hemoglobin: 10.2 g/dL — ABNORMAL LOW (ref 13.0–17.0)
Lymphocytes Relative: 18 % (ref 12–46)
Lymphs Abs: 1 10*3/uL (ref 0.7–4.0)
MCH: 23 pg — ABNORMAL LOW (ref 26.0–34.0)
MCHC: 31 g/dL (ref 30.0–36.0)
MCV: 74.3 fL — ABNORMAL LOW (ref 78.0–100.0)
MONO ABS: 0.6 10*3/uL (ref 0.1–1.0)
Monocytes Relative: 11 % (ref 3–12)
NEUTROS ABS: 3.6 10*3/uL (ref 1.7–7.7)
Neutrophils Relative %: 68 % (ref 43–77)
PLATELETS: 154 10*3/uL (ref 150–400)
RBC: 4.43 MIL/uL (ref 4.22–5.81)
RDW: 17.1 % — AB (ref 11.5–15.5)
WBC: 5.4 10*3/uL (ref 4.0–10.5)

## 2014-04-02 LAB — I-STAT TROPONIN, ED: TROPONIN I, POC: 0.01 ng/mL (ref 0.00–0.08)

## 2014-04-02 LAB — I-STAT VENOUS BLOOD GAS, ED
Bicarbonate: 24.2 mEq/L — ABNORMAL HIGH (ref 20.0–24.0)
O2 Saturation: 79 %
PCO2 VEN: 38.8 mmHg — AB (ref 45.0–50.0)
PO2 VEN: 43 mmHg (ref 30.0–45.0)
TCO2: 25 mmol/L (ref 0–100)
pH, Ven: 7.402 — ABNORMAL HIGH (ref 7.250–7.300)

## 2014-04-02 LAB — D-DIMER, QUANTITATIVE (NOT AT ARMC): D DIMER QUANT: 0.71 ug{FEU}/mL — AB (ref 0.00–0.48)

## 2014-04-02 LAB — PROTIME-INR
INR: 2.07 — AB (ref 0.00–1.49)
Prothrombin Time: 23.5 seconds — ABNORMAL HIGH (ref 11.6–15.2)

## 2014-04-02 MED ORDER — IOHEXOL 350 MG/ML SOLN
100.0000 mL | Freq: Once | INTRAVENOUS | Status: AC | PRN
  Administered 2014-04-02: 100 mL via INTRAVENOUS

## 2014-04-02 NOTE — ED Provider Notes (Signed)
CSN: [id number]     Arrival date & time 04/02/14  1924 History   First MD Initiated Contact with Patient 04/02/14 1954     No chief complaint on file.   (Consider location/radiation/quality/duration/timing/severity/associated sxs/prior Treatment) HPI Comments: Patient is a 73 year old male with a history of hypertension, hyperlipidemia, CAD s/p bypass surgery on 12/01/13 (currently in cardiac rehab), PAF on chronic coumadin, and chronic diastolic CHF (LVEF 55-60%). Patient was sent to the emergency department today by his primary care provider for further workup. Patient states that he has been experiencing shortness of breath which she characterizes as difficulty taking a deep breath. He also states he is more dyspneic on exertion and that symptoms have been progressing over the past 5-6 weeks. Patient states that he sometimes feels as though he has associated chest congestion which she is able to "break up" with coughing. He states that his cough is nonproductive. Patient has also been taking allergy medicines for symptoms without improvement. He states that he has seen his cardiologist about these complaints with an unremarkable workup. He was referred back to his primary care provider who had a d-dimer completed today which was elevated at 0.73. Patient denies associated chest pain, fever, leg swelling, nausea, vomiting, weight gain, and hemoptysis. He states he is scheduled to see a pulmonologist if his workup today is unremarkable.  The history is provided by the patient. No language interpreter was used.    Past Medical History  Diagnosis Date  . Syncope     a. vasovagal with documented bradycardia.  . Pacemaker     a. 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 b. MDT generator replacement 2012.  . Obesity   . HTN (hypertension)   . HLD (hyperlipidemia)   . DJD (degenerative joint disease)   . GERD (gastroesophageal reflux disease)   . Impaired fasting glucose     A1C  check every year  . PAF (paroxysmal atrial fibrillation)     on chronic systemic anticoagulation  . Coronary artery disease     a. s/p PCI of LAD 1997. b. Cutting balloon angioplasty to LCx in 2002. c. s/p cath 11/2013 with severe 3 vessel ASCAD s/p CABG with LIMA to LAD, SVG to diag, SVG to left circ and SVG to PDA.  Marland Kitchen Postoperative atrial fibrillation   . AS (aortic stenosis)     mild by echo 10/2013  . Hypokalemia   . Hypomagnesemia   . Carotid artery plaque     a. Duplex 10/2013: mild calcific plaque origin ICA. Left: intimal wall thickening CCA. 0-39% BICA.  Marland Kitchen PVC's (premature ventricular contractions)   . OSA (obstructive sleep apnea)   . Chronic diastolic CHF (congestive heart failure)    Past Surgical History  Procedure Laterality Date  . Cholecystectomy    . Knee arthroscopy  2000    Left  . Angioplasty  1997  . Pacemaker insertion  2002  . Coronary artery bypass graft N/A 12/01/2013    Procedure: CORONARY ARTERY BYPASS GRAFT TIMES FOUR USING LEFT INTERNAL MAMMARY ARTERY TO LAD, SAPHENOUS VEIN GRAFTS TO DIAGONAL, CIRCUMFELX AND POSTERIOR DESCENDING;  Surgeon: Kerin Perna, MD;  Location: [facility] OR;  Service: Open Heart Surgery;  Laterality: N/A;   Family History  Problem Relation Age of Onset  . Leukemia Father     deceased  . Colon cancer Mother     deceased  . Heart attack Mother   . Heart attack Brother     deceased  History  Substance Use Topics  . Smoking status: Former Smoker    Quit date: 05/29/1966  . Smokeless tobacco: Never Used  . Alcohol Use: Yes     Comment: ocassional    Review of Systems  Constitutional: Negative for fever.  Respiratory: Positive for cough and shortness of breath.        +chest congestion  Cardiovascular: Negative for chest pain and leg swelling.  Gastrointestinal: Negative for nausea and vomiting.  Neurological: Negative for dizziness, syncope, weakness and light-headedness.  All other systems reviewed and are  negative.     Allergies  Acetaminophen-codeine; Amoxicillin; and Diovan  Home Medications   Prior to Admission medications   Medication Sig Start Date End Date Taking? Authorizing Provider  amLODipine (NORVASC) 5 MG tablet Take 1 tablet (5 mg total) by mouth daily. 01/05/14  Yes Beatrice Lecher, PA-C  aspirin EC 81 MG EC tablet Take 1 tablet (81 mg total) by mouth daily. 12/07/13  Yes Erin Barrett, PA-C  atorvastatin (LIPITOR) 20 MG tablet Take 20 mg by mouth daily at 6 PM.    Yes Historical Provider, MD  cholecalciferol (GNP VITAMIN D) 400 UNITS TABS Take 400 Units by mouth daily.     Yes Historical Provider, MD  fexofenadine (ALLEGRA) 180 MG tablet Take 180 mg by mouth daily.     Yes Historical Provider, MD  fluticasone (FLONASE) 50 MCG/ACT nasal spray Place 2 sprays into the nose daily.     Yes Historical Provider, MD  furosemide (LASIX) 40 MG tablet Take 1 tablet (40 mg total) by mouth daily. For 7 Days 12/30/13  Yes Beatrice Lecher, PA-C  Glucosamine-Chondroit-Vit C-Mn (GLUCOSAMINE CHONDR 1500 COMPLX) CAPS Take 1 capsule by mouth daily.    Yes Historical Provider, MD  L-Lysine 500 MG CAPS Take 1 capsule by mouth daily.     Yes Historical Provider, MD  Magnesium Oxide 250 MG TABS Take 2 tablets (500 mg total) by mouth 2 (two) times daily. 02/12/14  Yes [name], MD  metoprolol tartrate (LOPRESSOR) 25 MG tablet Take 1 tablet (25 mg total) by mouth 2 (two) times daily. 12/07/13  Yes Erin Barrett, PA-C  Multiple Vitamin (MULTIVITAMIN) tablet Take 1 tablet by mouth daily.     Yes Historical Provider, MD  Omega-3 Fatty Acids (FISH OIL) 500 MG CAPS Take 1,000 mg by mouth 2 (two) times daily.   Yes Historical Provider, MD  pantoprazole (PROTONIX) 40 MG tablet Take 40 mg by mouth daily.     Yes Historical Provider, MD  potassium chloride SA (K-DUR,KLOR-CON) 20 MEQ tablet Take 2 tablets (40 mEq total) by mouth 2 (two) times daily. 01/22/14  Yes [name], MD  warfarin (COUMADIN) 5 MG tablet  Take 5-7.5 mg by mouth as directed. 7.5mg  Mon and fri only 5mg  all other days 09/30/13  Yes Historical Provider, MD  meclizine (ANTIVERT) 25 MG tablet Take 25 mg by mouth once.     Historical Provider, MD  nitroGLYCERIN (NITROSTAT) 0.4 MG SL tablet Place 1 tablet (0.4 mg total) under the tongue every 5 (five) minutes as needed for chest pain. 01/28/14   [name], MD  ONE TOUCH ULTRA TEST test strip  01/12/14   Historical Provider, MD  Dola Argyle LANCETS FINE MISC  01/12/14   Historical Provider, MD  oxyCODONE (OXY IR/ROXICODONE) 5 MG immediate release tablet Take 1-2 tablets (5-10 mg total) by mouth every 3 (three) hours as needed for moderate pain. 12/07/13   Erin Barrett,  PA-C   BP 139/79 mmHg  Pulse 74  Temp(Src) 98.3 F (36.8 C) (Oral)  Resp 14  SpO2 94%   Physical Exam  Constitutional: He is oriented to person, place, and time. He appears well-developed and well-nourished. No distress.  Nontoxic/nonseptic appearing  HENT:  Head: Normocephalic and atraumatic.  Mouth/Throat: No oropharyngeal exudate.  Eyes: Conjunctivae and EOM are normal. No scleral icterus.  Neck: Normal range of motion.  Cardiovascular: Normal rate, regular rhythm and intact distal pulses.   Murmur (II/VI SEM) heard. Pulmonary/Chest: Effort normal. No respiratory distress. He has no wheezes. He has no rales.  Chest expansion symmetric. No tachypnea or dyspnea.  Abdominal: Soft. He exhibits distension. There is no tenderness. There is no rebound.  Soft distended, but nontender abdomen  Musculoskeletal: Normal range of motion.  Neurological: He is alert and oriented to person, place, and time. He exhibits normal muscle tone. Coordination normal.  GCS 15. Speech is goal oriented. Patient moves extremity without ataxia.  Skin: Skin is warm and dry. No rash noted. He is not diaphoretic. No erythema. No pallor.  Psychiatric: He has a normal mood and affect. His behavior is normal.  Nursing note and vitals  reviewed.   ED Course  Procedures (including critical care time) Labs Review Labs Reviewed  D-DIMER, QUANTITATIVE - Abnormal; Notable for the following:    D-Dimer, Quant 0.71 (*)    All other components within normal limits  CBC WITH DIFFERENTIAL - Abnormal; Notable for the following:    Hemoglobin 10.2 (*)    HCT 32.9 (*)    MCV 74.3 (*)    MCH 23.0 (*)    RDW 17.1 (*)    All other components within normal limits  COMPREHENSIVE METABOLIC PANEL - Abnormal; Notable for the following:    Glucose, Bld 156 (*)    AST 47 (*)    GFR calc non Af Amer 64 (*)    GFR calc Af Amer 74 (*)    All other components within normal limits  PROTIME-INR - Abnormal; Notable for the following:    Prothrombin Time 23.5 (*)    INR 2.07 (*)    All other components within normal limits  I-STAT VENOUS BLOOD GAS, ED - Abnormal; Notable for the following:    pH, Ven 7.402 (*)    pCO2, Ven 38.8 (*)    Bicarbonate 24.2 (*)    All other components within normal limits  BLOOD GAS, VENOUS  I-STAT TROPOININ, ED    Imaging Review Dg Chest 2 View  04/02/2014   CLINICAL DATA:  Initial evaluation for worsening shortness of breath.  EXAM: CHEST  2 VIEW  COMPARISON:  Prior radiograph from 11/5 earlier the same day as well as prior study from 02/04/2014  FINDINGS: Left -sided dual lead transvenous pacemaker/AICD is stable. Median sternotomy wires with underlying CABG markers are unchanged. Moderate cardiomegaly is not significantly changed. Mediastinal silhouette within normal limits.  Lungs are normally inflated. Small left pleural effusion is present at the left costophrenic angle. A smaller right pleural effusion blunts the right costophrenic angle. No overt pulmonary edema. No focal airspace disease. No pneumothorax.  Multilevel degenerative changes present within the visualized spine. No acute osseus abnormality  IMPRESSION: 1. Small layering bilateral pleural effusions, left larger than right. 2. Cardiomegaly  without pulmonary edema.   Electronically Signed   By: Rise Mu M.D.   On: 04/02/2014 20:40   Ct Angio Chest Pe W/cm &/or Wo Cm  04/02/2014   CLINICAL  DATA:  Dyspnea on exertion for 6 weeks. Patient is in cardiac rehab.  EXAM: CT ANGIOGRAPHY CHEST WITH CONTRAST  TECHNIQUE: Multidetector CT imaging of the chest was performed using the standard protocol during bolus administration of intravenous contrast. Multiplanar CT image reconstructions and MIPs were obtained to evaluate the vascular anatomy.  CONTRAST:  OMNIPAQUE IOHEXOL 350 MG/ML SOLN  COMPARISON:  None.  FINDINGS: Technically adequate study with good opacification of the central and segmental pulmonary arteries. No focal filling defects are demonstrated. No evidence of significant pulmonary embolus.  Postoperative changes consistent with sternotomy and bypass grafting. Infiltration in the retrosternal and superficial sternal soft tissues is likely due to postoperative change. No loculated fluid collection to suggest abscess. Small pericardial fluid or thickening anteriorly. Calcification of the native Coronary arteries. Scattered mediastinal lymph nodes are upper limits of normal and likely are reactive. Esophagus is decompressed. Bilateral pleural effusions with basilar atelectasis. No pneumothorax. Airways appear patent. Degenerative changes in the spine.  Included portions of the upper abdominal organs are grossly unremarkable.  Review of the MIP images confirms the above findings.  IMPRESSION: No evidence of significant pulmonary embolus. Postoperative changes consistent with recent sternotomy and Coronary bypass grafting. Small a oral pleural effusions and basilar atelectasis.   Electronically Signed   By: Burman Nieves M.D.   On: 04/02/2014 22:14     EKG Interpretation   Date/Time:  Thursday April 02 2014 19:35:36 EST Ventricular Rate:  81 PR Interval:  172 QRS Duration: 148 QT Interval:  430 QTC Calculation: 499 R  Axis:   -49 Text Interpretation:  Electronic ventricular pacemaker Since last tracing  rhythym is now atrial sensed ventricular pacing Confirmed by WENTZ  MD,  ELLIOTT [phone]) on 04/02/2014 10:55:11 PM      MDM   Final diagnoses:  Dyspnea on exertion    73 year old male presents to the emergency department for further evaluation of shortness of breath. Patient is currently in cardiac rehabilitation following a bypass surgery in July. He has seen his cardiologist, Dr. [name], regarding his symptoms with a negative workup. He was referred back to his PCP for further evaluation of his symptoms and found to have an elevated d-dimer today. Patient does have a d-dimer of 0.71. CT angiogram completed for further evaluation of symptoms, despite the fact that he does place out of concern for PE with an age-adjusted dimer.  CT scan shows no evidence of pulmonary embolus. He does have small pleural effusions and basilar atelectasis. Patient with oxygen saturations of 92% while ambulating. He has no hypoxia while at rest. No signs of acute respiratory compromise. Cardiac workup today is negative and patient has no complaints of chest pain. H/H stable.  Do not believe further emergent workup is indicated at this time. Will refer patient back to his primary care provider. He states that he has plans to follow up with a pulmonologist for further workup. Return precautions discussed and provided. Patient agreeable to plan with no unaddressed concerns. Patient seen also by Dr. Effie Shy who is in agreement with this workup, assessment, management plan, and patient's stability for discharge.   Filed Vitals:   04/02/14 2130 04/02/14 2220 04/02/14 2252 04/02/14 2258  BP: 130/75 139/79 141/87   Pulse: 72 74 77   Temp:   98 F (36.7 C) 98 F (36.7 C)  TempSrc:   Oral Oral  Resp: 16 14 20    SpO2: 93% 94% 93%      Antony Madura, PA-C 04/02/14 2358  Mechele Collin  Rachel MouldsL Wentz, MD 04/03/14 [phone]

## 2014-04-02 NOTE — Discharge Instructions (Signed)

## 2014-04-02 NOTE — ED Notes (Signed)
Patient transported to X-ray 

## 2014-04-02 NOTE — ED Notes (Addendum)
Pt. received a call from Dr. Tommas Olp office this evening advised him to go to ER due to elevated D-dimer result taken today at [facility] . Reports chronic SOB /exertional dyspnea , denies chest pain or chest tightness.

## 2014-04-02 NOTE — ED Notes (Signed)
Pt SPO2 stayed around 92 while ambulating. When walking fast SPO2 dropped to 85 and heart rate jumped 87. Patients end rate was 92 SPO2 and 85 heart rate.

## 2014-04-03 ENCOUNTER — Encounter (HOSPITAL_COMMUNITY)
Admission: RE | Admit: 2014-04-03 | Discharge: 2014-04-03 | Disposition: A | Discharge status: Home / Self Care | Payer: Medicare Other | Source: Ambulatory Visit | Attending: Cardiology | Admitting: Cardiology

## 2014-04-03 DIAGNOSIS — Z951 Presence of aortocoronary bypass graft: Secondary | ICD-10-CM | POA: Diagnosis not present

## 2014-04-03 NOTE — ED Provider Notes (Signed)
  Face-to-face evaluation   History: patient with history of shortness of breath for several days, with dyspnea on exertion for several weeks.  He has a nonproductive cough.  He was sent here for CT scan because he has a marginally elevated d-dimer at 0.73.  Physical exam:alert elderly man who is cooperative and comfortable.  Heart regular rhythm, no murmur.  Lungs clear to auscultation.   Medical screening examination/treatment/procedure(s) were conducted as a shared visit with non-physician practitioner(s) and myself.  I personally evaluated the patient during the encounter  Flint Melter, MD 04/03/14 2353

## 2014-04-06 ENCOUNTER — Encounter (HOSPITAL_COMMUNITY)
Admission: RE | Admit: 2014-04-06 | Discharge: 2014-04-06 | Disposition: A | Discharge status: Home / Self Care | Payer: Medicare Other | Source: Ambulatory Visit | Attending: Cardiology | Admitting: Cardiology

## 2014-04-06 ENCOUNTER — Telehealth: Payer: Self-pay | Admitting: Cardiology

## 2014-04-06 DIAGNOSIS — Z951 Presence of aortocoronary bypass graft: Secondary | ICD-10-CM | POA: Diagnosis not present

## 2014-04-06 NOTE — Telephone Encounter (Signed)
Joann from cardiac rehab calling stating his BP has had a wt gain since Friday of 4 lbs.  Also SOB, cough and wheezing.  While talking w/Joann pt informed her that he had an appointment with his PCP this afternoon. So he will follow up with PCP.

## 2014-04-06 NOTE — Telephone Encounter (Signed)
New message      Pt is at cardia rehab---c/o sob, tight cough with wheezing and 4lbs wt gain since fri

## 2014-04-08 ENCOUNTER — Telehealth: Payer: Self-pay | Admitting: Cardiology

## 2014-04-08 ENCOUNTER — Encounter (HOSPITAL_COMMUNITY)
Admission: RE | Admit: 2014-04-08 | Discharge: 2014-04-08 | Disposition: A | Discharge status: Home / Self Care | Payer: Medicare Other | Source: Ambulatory Visit | Attending: Cardiology | Admitting: Cardiology

## 2014-04-08 DIAGNOSIS — Z951 Presence of aortocoronary bypass graft: Secondary | ICD-10-CM | POA: Diagnosis not present

## 2014-04-08 NOTE — Progress Notes (Signed)
Pt arrived at cardiac rehab c/o persistent dypsnea, cough and wheezing.  Pt was seen by PCP Monday given albuterol inhaler which he is using several times daily with minimal relief. Pt weight 265 lb which is up 5 lb in 6 days. Pt notices decreased urination.  O2 sat-95%, lungs scattered inspiratory and expiratory wheezes throughout, with tight congested cough.   Pt able to exercise without difficulty and actually reports that he feels minimal relief since starting albuterol.  Pt has upcoming pulmonary consult Monday 04/13/14.  Attempt to reschedule pulmonary appointment sooner, however no early appt available.    Dr. Norris Cross nurse notified with no return phone call.  Pt PCP office nurse, Noreene Larsson,  contacted, however Pt PCP out of office today.  Per Noreene Larsson, On call MD prefers to not make medication adjustments over phone and requests pt present to ED for treatment.  Pt declines advice to present to ED.  Pt advised to continue current regimen.  Advised to contact [facility] tomorrow if he does not receive return phone call.  Pt also instructed to present to ED for worsening unimproved symptoms.  Pt verbalized understanding.

## 2014-04-08 NOTE — Telephone Encounter (Signed)
New problem:    Marvia Pickles at [facility] needs a call back  #[phone] wait up 5lb and wheezing.   Please give her a call back.

## 2014-04-09 ENCOUNTER — Other Ambulatory Visit: Payer: Self-pay

## 2014-04-09 DIAGNOSIS — I5032 Chronic diastolic (congestive) heart failure: Secondary | ICD-10-CM

## 2014-04-09 MED ORDER — MAGNESIUM OXIDE 250 MG PO TABS: 500.0000 mg | ORAL_TABLET | Freq: Two times a day (BID) | ORAL | Status: DC

## 2014-04-09 MED ORDER — METOPROLOL TARTRATE 25 MG PO TABS: 25.0000 mg | ORAL_TABLET | Freq: Two times a day (BID) | ORAL | Status: DC

## 2014-04-09 NOTE — Telephone Encounter (Signed)
Per Dr. [name], instructed patient to increase his Lasix to 40 mg BID for a week. Appointment made with Ronie Spies next Friday, November 20 at 1430 for follow-up.  Verified with patient CPAP titration scheduled for 12/13. Patient st he will let us know if he wants to continue with titration -- if he is still having SOB he st it will be a pointless study.  Patient requested refills for mag and metoprolol. Called in to [facility].

## 2014-04-09 NOTE — Telephone Encounter (Signed)
Follow up     Talk to the nurse regarding his fluid retention

## 2014-04-09 NOTE — Telephone Encounter (Signed)
Patient c/o SOB on exertion.  Pt st he uses a respirator that helps him breathe. Pt st he has gained 6 lbs in 6 days. He weighs regularly at Cardiac Rehab. His ABD and R foot are swollen. He currently takes 40 mg lasix daily. Pt has appointment with Pulmonary on Monday.   Routing to Dr. [name] for review and recommendations.

## 2014-04-10 ENCOUNTER — Telehealth (HOSPITAL_COMMUNITY): Payer: Self-pay | Admitting: Family Medicine

## 2014-04-10 ENCOUNTER — Telehealth (HOSPITAL_COMMUNITY): Payer: Self-pay | Admitting: Cardiac Rehabilitation

## 2014-04-10 ENCOUNTER — Encounter (HOSPITAL_COMMUNITY): Admission: RE | Admit: 2014-04-10 | Payer: Medicare Other | Source: Ambulatory Visit

## 2014-04-10 NOTE — Telephone Encounter (Signed)
pc received from pt that he did not attend cardiac rehab today due to continued dyspnea, cough and fatigue.  Pt started increased lasix per Dr. [name] last night  with minimal relief.  Pt advised to present to ED for unrelieved or worsening symptoms. Understanding verbalized

## 2014-04-13 ENCOUNTER — Encounter: Payer: Self-pay | Admitting: Internal Medicine

## 2014-04-13 ENCOUNTER — Other Ambulatory Visit (INDEPENDENT_AMBULATORY_CARE_PROVIDER_SITE_OTHER): Payer: Medicare Other

## 2014-04-13 ENCOUNTER — Telehealth (HOSPITAL_COMMUNITY): Payer: Self-pay | Admitting: Family Medicine

## 2014-04-13 ENCOUNTER — Ambulatory Visit (INDEPENDENT_AMBULATORY_CARE_PROVIDER_SITE_OTHER): Payer: Medicare Other | Admitting: Internal Medicine

## 2014-04-13 ENCOUNTER — Encounter (HOSPITAL_COMMUNITY): Payer: Medicare Other

## 2014-04-13 VITALS — BP 122/80 | HR 84 | Ht 67.0 in | Wt 276.0 lb

## 2014-04-13 DIAGNOSIS — R0600 Dyspnea, unspecified: Secondary | ICD-10-CM

## 2014-04-13 DIAGNOSIS — R0602 Shortness of breath: Secondary | ICD-10-CM | POA: Insufficient documentation

## 2014-04-13 LAB — CBC WITH DIFFERENTIAL/PLATELET
BASOS PCT: 0.9 % (ref 0.0–3.0)
Basophils Absolute: 0.1 10*3/uL (ref 0.0–0.1)
Eosinophils Absolute: 0.2 10*3/uL (ref 0.0–0.7)
Eosinophils Relative: 2.1 % (ref 0.0–5.0)
HCT: 33.3 % — ABNORMAL LOW (ref 39.0–52.0)
Hemoglobin: 10.5 g/dL — ABNORMAL LOW (ref 13.0–17.0)
LYMPHS PCT: 12.9 % (ref 12.0–46.0)
Lymphs Abs: 1 10*3/uL (ref 0.7–4.0)
MCHC: 31.4 g/dL (ref 30.0–36.0)
MCV: 72.4 fl — ABNORMAL LOW (ref 78.0–100.0)
Monocytes Absolute: 1 10*3/uL (ref 0.1–1.0)
Monocytes Relative: 12 % (ref 3.0–12.0)
NEUTROS ABS: 5.8 10*3/uL (ref 1.4–7.7)
Neutrophils Relative %: 72.1 % (ref 43.0–77.0)
Platelets: 207 10*3/uL (ref 150.0–400.0)
RBC: 4.61 Mil/uL (ref 4.22–5.81)
RDW: 19.3 % — ABNORMAL HIGH (ref 11.5–15.5)
WBC: 8 10*3/uL (ref 4.0–10.5)

## 2014-04-13 MED ORDER — HYDROCODONE-IBUPROFEN 7.5-200 MG PO TABS: 1.0000 | ORAL_TABLET | Freq: Three times a day (TID) | ORAL | Status: DC | PRN

## 2014-04-13 MED ORDER — PREDNISONE 10 MG PO TABS: ORAL_TABLET | ORAL | Status: DC

## 2014-04-13 NOTE — Progress Notes (Signed)
Subjective:    Patient ID: Brandon Klein, male    DOB: May 29, 1941, 73 y.o.   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  Was able to go up steps to second floor until early November 2015 but not since coughing and referred to pulmonary clinic 04/14/14 by Dr Zachery DauerBarnes    04/13/2014 1st [facility] office visit/ Wert   Chief Complaint  Patient presents with  . Pulmonary Consult    Referred by Dr. Juluis RainierElizabeth Barnes. Pt c/o cough and SOB for the past 2-6 wks. He states that he is SOB any time- with or without any exertion. Cough is non prod. Breathing and cough seem worse when he lies down.   hoarseness started Holloweeen night comes and goes then really bad cough started 04/03/14   proair didn't help Cough worse when lie down but never productive and settles down overnight and does not wake him up. Mostly sob when coughing, sob at rest when coughs real hard   No obvious other patterns in day to day or daytime variabilty or assoc chronic cough or cp or chest tightness, subjective wheeze overt sinus or hb symptoms. No unusual exp hx or h/o childhood pna/ asthma or knowledge of premature birth.  Sleeping ok without nocturnal  or early am exacerbation  of respiratory  c/o's or need for noct saba. Also denies any obvious fluctuation of symptoms with weather or environmental changes or other aggravating or alleviating factors except as outlined above   Current Medications, Allergies, Complete Past Medical History, Past Surgical History, Family History, and Social History were reviewed in [facility] electronic medical record.            Review of Systems  Constitutional: Negative for fever, chills, activity change,  appetite change and unexpected weight change.  HENT: Negative for congestion, dental problem, postnasal drip, rhinorrhea, sneezing, sore throat, trouble swallowing and voice change.   Eyes: Negative for visual disturbance.  Respiratory: Positive for cough and shortness of breath. Negative for choking.   Cardiovascular: Negative for chest pain and leg swelling.  Gastrointestinal: Negative for nausea, vomiting and abdominal pain.  Genitourinary: Negative for difficulty urinating.  Musculoskeletal: Negative for arthralgias.  Skin: Negative for rash.  Psychiatric/Behavioral: Negative for behavioral problems and confusion.       Objective:   Physical Exam  amb hoarse wm with classic pseudowheeze/ voice fatigue  Wt Readings from Last 3 Encounters:  04/13/14 276 lb (125.193 kg)  03/05/14 264 lb (119.75 kg)  02/19/14 260 lb (117.935 kg)    Vital signs reviewed   HEENT: nl dentition, turbinates, and orophanx. Nl external ear canals without cough reflex   NECK :  without JVD/Nodes/TM/ nl carotid upstrokes bilaterally   LUNGS: no acc muscle use, clear to A and P bilaterally without cough on insp or exp maneuvers   CV:  RRR  no s3 or ncrease in P2, no edema   II-III/ VI sem  ABD:  soft and nontender with nl excursion in the supine position. No bruits or organomegaly, bowel sounds nl  MS:  warm without deformities, calf tenderness, cyanosis or clubbing  SKIN: warm and  dry without lesions    NEURO:  alert, approp, no deficits    CTa  04/02/14 No evidence of significant pulmonary embolus. Postoperative changes consistent with recent sternotomy and Coronary bypass grafting. Small bilateral  pleural effusions and basilar atelectasis.      Chemistry      Component Value Date/Time   NA 142 04/02/2014 1947   K 4.6 04/02/2014 1947   CL 105 04/02/2014 1947   CO2 22 04/02/2014 1947   BUN 19 04/02/2014 1947   CREATININE 1.11 04/02/2014 1947      Component Value Date/Time    CALCIUM 8.8 04/02/2014 1947   ALKPHOS 91 04/02/2014 1947   AST 47* 04/02/2014 1947   ALT 48 04/02/2014 1947   BILITOT 0.7 04/02/2014 1947        Lab Results  Component Value Date   TSH 1.350 11/27/2013     Lab Results  Component Value Date   PROBNP 535.0* 04/13/2014          Assessment & Plan:

## 2014-04-13 NOTE — Patient Instructions (Addendum)
Protonix 40 mg Take 30-60 min before first meal of the day and pepcid 20 mg at hs  GERD (REFLUX)  is an extremely common cause of respiratory symptoms just like yours , many times with no obvious heartburn at all.    It can be treated with medication, but also with lifestyle changes including avoidance of late meals, excessive alcohol, smoking cessation, and avoid fatty foods, chocolate, peppermint, colas, red wine, and acidic juices such as orange juice.  NO MINT OR MENTHOL PRODUCTS SO NO COUGH DROPS  USE SUGARLESS CANDY INSTEAD (Jolley ranchers or Stover's or Life Savers) or even ice chips will also do - the key is to swallow to prevent all throat clearing. NO OIL BASED VITAMINS - use powdered substitutes.  Prednisone 10 mg take  4 each am x 2 days,   2 each am x 2 days,  1 each am x 2 days and stop  Take delsym two tsp every 12 hours and supplement if needed withl  Hycodan 7.5 on every 4 hours to suppress the urge to cough. Swallowing water or using ice chips/non mint and menthol containing candies (such as lifesavers or sugarless jolly ranchers) are also effective.  You should rest your voice and avoid activities that you know make you cough.  Once you have eliminated the cough for 3 straight days try reducing the hycodan first,  then the delsym as tolerated.      Please remember to go to the lab  department downstairs for your tests - we will call you with the results when they are available.  Please schedule a follow up office visit in 2 weeks, sooner if needed

## 2014-04-14 LAB — BRAIN NATRIURETIC PEPTIDE: Pro B Natriuretic peptide (BNP): 535 pg/mL — ABNORMAL HIGH (ref 0.0–100.0)

## 2014-04-15 ENCOUNTER — Telehealth: Payer: Self-pay | Admitting: Cardiology

## 2014-04-15 ENCOUNTER — Encounter (HOSPITAL_COMMUNITY): Payer: Medicare Other

## 2014-04-15 ENCOUNTER — Ambulatory Visit (INDEPENDENT_AMBULATORY_CARE_PROVIDER_SITE_OTHER): Payer: Medicare Other | Admitting: *Deleted

## 2014-04-15 DIAGNOSIS — R001 Bradycardia, unspecified: Secondary | ICD-10-CM

## 2014-04-15 LAB — MDC_IDC_ENUM_SESS_TYPE_REMOTE
Battery Impedance: 135 Ohm
Battery Remaining Longevity: 146 mo
Battery Voltage: 2.79 V
Brady Statistic AP VS Percent: 12 %
Brady Statistic AS VP Percent: 6 %
Date Time Interrogation Session: 20151118172452
Lead Channel Impedance Value: 409 Ohm
Lead Channel Pacing Threshold Amplitude: 0.375 V
Lead Channel Pacing Threshold Amplitude: 0.875 V
Lead Channel Pacing Threshold Pulse Width: 0.4 ms
Lead Channel Pacing Threshold Pulse Width: 0.4 ms
Lead Channel Sensing Intrinsic Amplitude: 11.2 mV
Lead Channel Setting Pacing Amplitude: 2.5 V
Lead Channel Setting Pacing Pulse Width: 0.4 ms
MDC IDC MSMT LEADCHNL RA SENSING INTR AMPL: 1.4 mV
MDC IDC MSMT LEADCHNL RV IMPEDANCE VALUE: 745 Ohm
MDC IDC SET LEADCHNL RA PACING AMPLITUDE: 2 V
MDC IDC SET LEADCHNL RV SENSING SENSITIVITY: 5.6 mV
MDC IDC STAT BRADY AP VP PERCENT: 0 %
MDC IDC STAT BRADY AS VS PERCENT: 81 %

## 2014-04-15 NOTE — Progress Notes (Signed)
Remote pacemaker transmission.   

## 2014-04-15 NOTE — Progress Notes (Signed)
Quick Note:  Spoke with pt and notified of results per Dr. Wert. Pt verbalized understanding and denied any questions.  ______ 

## 2014-04-15 NOTE — Assessment & Plan Note (Addendum)
-  PFTs 11/27/13 wnl -04/13/2014  Walked RA x 3 laps @ 185 ft each stopped due to  Mild sob but nl pace and completed all 3 s desat  Symptoms are markedly disproportionate to objective findings and not clear this is a lung problem but pt does appear to have difficult airway management issues.  DDX of  difficult airways management all start with A and  include Adherence, Ace Inhibitors, Acid Reflux, Active Sinus Disease, Alpha 1 Antitripsin deficiency, Anxiety masquerading as Airways dz,  ABPA,  allergy(esp in young), Aspiration (esp in elderly), Adverse effects of DPI,  Active smokers, plus two Bs  = Bronchiectasis and Beta blocker use..and one C= CHF   Adherence is always the initial "prime suspect" and is a multilayered concern that requires a "trust but verify" approach in every patient - starting with knowing how to use medications, especially inhalers, correctly, keeping up with refills and understanding the fundamental difference between maintenance and prns vs those medications only taken for a very short course and then stopped and not refilled.   Acei > not on them  ? Acid (or non-acid) GERD > always difficult to exclude as up to 75% of pts in some series report no assoc GI/ Heartburn symptoms> rec max (24h)  acid suppression and diet restrictions/ reviewed and instructions given in writing.   ? Anxiety > less likely since just has sob with exertion   A PE unlikely on coumadin   ? chf > doubt though note bnp intermediate/ LV ok pre cabg in 11/2013 / does have underlying AS to be aware of   See instructions for specific recommendations which were reviewed directly with the patient who was given a copy with highlighter outlining the key components.

## 2014-04-15 NOTE — Telephone Encounter (Signed)
Attempted confirm remote transmission for today. Called both numbers listed in pt chart. No answer and was unable to leave a message.

## 2014-04-16 ENCOUNTER — Encounter: Payer: Self-pay | Admitting: Cardiology

## 2014-04-17 ENCOUNTER — Ambulatory Visit (INDEPENDENT_AMBULATORY_CARE_PROVIDER_SITE_OTHER): Payer: Medicare Other | Admitting: Pharmacist

## 2014-04-17 ENCOUNTER — Ambulatory Visit (INDEPENDENT_AMBULATORY_CARE_PROVIDER_SITE_OTHER): Payer: Medicare Other | Admitting: Physician Assistant

## 2014-04-17 ENCOUNTER — Encounter: Payer: Self-pay | Admitting: Physician Assistant

## 2014-04-17 ENCOUNTER — Encounter (HOSPITAL_COMMUNITY): Payer: Medicare Other

## 2014-04-17 ENCOUNTER — Telehealth: Payer: Self-pay | Admitting: Internal Medicine

## 2014-04-17 VITALS — BP 100/70 | HR 77 | Ht 67.0 in | Wt 270.8 lb

## 2014-04-17 DIAGNOSIS — I480 Paroxysmal atrial fibrillation: Secondary | ICD-10-CM

## 2014-04-17 DIAGNOSIS — I10 Essential (primary) hypertension: Secondary | ICD-10-CM

## 2014-04-17 DIAGNOSIS — R600 Localized edema: Secondary | ICD-10-CM

## 2014-04-17 DIAGNOSIS — I493 Ventricular premature depolarization: Secondary | ICD-10-CM

## 2014-04-17 DIAGNOSIS — I5032 Chronic diastolic (congestive) heart failure: Secondary | ICD-10-CM

## 2014-04-17 DIAGNOSIS — Z9861 Coronary angioplasty status: Secondary | ICD-10-CM

## 2014-04-17 DIAGNOSIS — I350 Nonrheumatic aortic (valve) stenosis: Secondary | ICD-10-CM

## 2014-04-17 DIAGNOSIS — R001 Bradycardia, unspecified: Secondary | ICD-10-CM

## 2014-04-17 DIAGNOSIS — I4891 Unspecified atrial fibrillation: Secondary | ICD-10-CM

## 2014-04-17 DIAGNOSIS — E785 Hyperlipidemia, unspecified: Secondary | ICD-10-CM

## 2014-04-17 DIAGNOSIS — I2510 Atherosclerotic heart disease of native coronary artery without angina pectoris: Secondary | ICD-10-CM

## 2014-04-17 DIAGNOSIS — R609 Edema, unspecified: Secondary | ICD-10-CM

## 2014-04-17 DIAGNOSIS — R0600 Dyspnea, unspecified: Secondary | ICD-10-CM

## 2014-04-17 DIAGNOSIS — R0602 Shortness of breath: Secondary | ICD-10-CM

## 2014-04-17 LAB — BASIC METABOLIC PANEL
BUN: 27 mg/dL — AB (ref 6–23)
CO2: 24 mEq/L (ref 19–32)
Calcium: 9 mg/dL (ref 8.4–10.5)
Chloride: 104 mEq/L (ref 96–112)
Creat: 1.19 mg/dL (ref 0.50–1.35)
GLUCOSE: 124 mg/dL — AB (ref 70–99)
Potassium: 3.7 mEq/L (ref 3.5–5.3)
SODIUM: 142 meq/L (ref 135–145)

## 2014-04-17 LAB — MAGNESIUM: Magnesium: 1.9 mg/dL (ref 1.5–2.5)

## 2014-04-17 LAB — POCT INR: INR: 2.1

## 2014-04-17 MED ORDER — AMLODIPINE BESYLATE 2.5 MG PO TABS: 2.5000 mg | ORAL_TABLET | Freq: Every day | ORAL | Status: DC

## 2014-04-17 MED ORDER — FUROSEMIDE 40 MG PO TABS: 40.0000 mg | ORAL_TABLET | Freq: Two times a day (BID) | ORAL | Status: DC

## 2014-04-17 NOTE — Progress Notes (Signed)
[address] [place], [place]  [address] Phone: [phone] Fax:  [phone]  Date:  04/17/2014   Patient ID:  Brandon, Klein 03/24/1941, MRN [id number]   PCP:  Gaye Alken, MD  Cardiologist: Dr. [name]  History of Present Illness: Brandon Klein is a 73 y.o. male with history of CAD (stent to LAD 1997, CBA to LCx 2002, s/p CABGx4 11/2013), mild AS by echo 10/2013, dyslipidemia, PAF (preceding CABG, with recurrence post-op treated with amiodarone which was stopped 01/28/14), volume overload treated with Lasix post-CABG (chronic diastolic CHF, L pleural effusion), remote syncope/bradycardia s/p Medtronic pacemaker, HTN, and recently diagnosed severe OSA who presents for follow-up. He was also found to have PVCs in 12/2013 on the monitor at cardiac rehab - K was 3.3, Mg was 1.4 and he was started on supplementation. I saw him in clinic 03/05/14 at which time he was feeling better. pBNP was mildly up at 344 but not so much that he was felt to be in CHF. 2D echo 10/2013: EF 55-60%, grade 1 d/d, mild AS, mod dilated LA. He has had SOB in the past felt multifactorial. 11/2013 PFTs: possible subclinical asthma.  Since that time he had worsening dyspnea and nonproductive cough. He underwent CTA 04/02/14 for mildly elevated d-dimer with no evidence of PE; small pleural effusion and bibasilar atx. He called into our office with these symptoms and was instructed to increase Lasix to 40mg  BID x 1 week. He did not realize that he was supposed to go back to regular dose after a week. Weight had initially increased but now continues to come back down gradually. He was referred to pulmonology and saw Dr. Sherene Sires. He felt symptoms were markedly disproportionate to objective findings and not clear this was a lung problem. He did not desat with ambulation. Dr. Sherene Sires prescribed Vicoprofen and prednisone. Since that time, cough is tremendously better. He feels like dyspnea is slowly improving. He has  had some hoarseness of his voice as well as constipation. He told me he feels like he is seeing too many doctors. This appointment was made today after he was told to increase the Lasix. He denies any chest pain, syncope or near syncope. He does feel fatigued at times. Pacer interrogation 11/18 was grossly unremarkable, occasional PVCs, no significant atrial fibrillation.   He is currently in evaluation by PCP for his anemia.  Recent Labs: 07/14/2013: Direct LDL 47.9; HDL Cholesterol by NMR 36.10* 11/27/2013: TSH 1.350 01/15/2014: LDL (calc) 42 04/02/2014: ALT 48; Creatinine 1.11; Potassium 4.6 04/13/2014: Hemoglobin 10.5*; Pro B Natriuretic peptide (BNP) 535.0*  Wt Readings from Last 3 Encounters:  04/17/14 270 lb 12.8 oz (122.834 kg)  04/13/14 276 lb (125.193 kg)  03/05/14 264 lb (119.75 kg)     Past Medical History  Diagnosis Date  . Syncope     a. vasovagal with documented bradycardia.  . Pacemaker     a. 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 b. MDT generator replacement 2012.  . Obesity   . HTN (hypertension)   . HLD (hyperlipidemia)   . DJD (degenerative joint disease)   . GERD (gastroesophageal reflux disease)   . Impaired fasting glucose     A1C check every year  . PAF (paroxysmal atrial fibrillation)     on chronic systemic anticoagulation  . Coronary artery disease     a. s/p PCI of LAD 1997. b. Cutting balloon angioplasty to LCx in 2002. c. s/p cath  11/2013 with severe 3 vessel ASCAD s/p CABG with LIMA to LAD, SVG to diag, SVG to left circ and SVG to PDA.  Marland Kitchen Postoperative atrial fibrillation   . AS (aortic stenosis)     mild by echo 10/2013  . Hypokalemia   . Hypomagnesemia   . Carotid artery plaque     a. Duplex 10/2013: mild calcific plaque origin ICA. Left: intimal wall thickening CCA. 0-39% BICA.  Marland Kitchen PVC's (premature ventricular contractions)   . OSA (obstructive sleep apnea)   . Chronic diastolic CHF (congestive heart failure)     Current  Outpatient Prescriptions  Medication Sig Dispense Refill  . amLODipine (NORVASC) 5 MG tablet Take 1 tablet (5 mg total) by mouth daily. 180 tablet 3  . aspirin EC 81 MG EC tablet Take 1 tablet (81 mg total) by mouth daily.    Marland Kitchen atorvastatin (LIPITOR) 20 MG tablet Take 20 mg by mouth daily at 6 PM.     . cholecalciferol (GNP VITAMIN D) 400 UNITS TABS Take 400 Units by mouth daily.      . famotidine (PEPCID) 20 MG tablet Take 20 mg by mouth at bedtime.    . fexofenadine (ALLEGRA) 180 MG tablet Take 180 mg by mouth daily.      . fluticasone (FLONASE) 50 MCG/ACT nasal spray Place 2 sprays into the nose daily.      . furosemide (LASIX) 40 MG tablet Take 1 tablet (40 mg total) by mouth daily. For 7 Days (Patient taking differently: Take 40 mg by mouth 2 (two) times daily. ) 30 tablet 11  . Glucosamine-Chondroit-Vit C-Mn (GLUCOSAMINE CHONDR 1500 COMPLX) CAPS Take 1 capsule by mouth daily.     Marland Kitchen HYDROcodone-ibuprofen (VICOPROFEN) 7.5-200 MG per tablet Take 1 tablet by mouth every 8 (eight) hours as needed for moderate pain. 30 tablet 0  . L-Lysine 500 MG CAPS Take 1 capsule by mouth daily.      . Magnesium Oxide 250 MG TABS Take 2 tablets (500 mg total) by mouth 2 (two) times daily. 120 tablet 6  . meclizine (ANTIVERT) 25 MG tablet Take 25 mg by mouth once.     . metoprolol tartrate (LOPRESSOR) 25 MG tablet Take 1 tablet (25 mg total) by mouth 2 (two) times daily. 60 tablet 6  . Multiple Vitamin (MULTIVITAMIN) tablet Take 1 tablet by mouth daily.      . nitroGLYCERIN (NITROSTAT) 0.4 MG SL tablet Place 1 tablet (0.4 mg total) under the tongue every 5 (five) minutes as needed for chest pain. 30 tablet 3  . ONE TOUCH ULTRA TEST test strip     . ONETOUCH DELICA LANCETS FINE MISC     . pantoprazole (PROTONIX) 40 MG tablet Take 40 mg by mouth daily.      Marland Kitchen Phenylephrine-DM-GG-APAP (DELSYM COUGH/COLD DAYTIME PO) Take by mouth 2 (two) times daily.    . potassium chloride SA (K-DUR,KLOR-CON) 20 MEQ tablet Take  2 tablets (40 mEq total) by mouth 2 (two) times daily. 120 tablet 6  . predniSONE (DELTASONE) 10 MG tablet Take  4 each am x 2 days,   2 each am x 2 days,  1 each am x 2 days and stop 14 tablet 0  . warfarin (COUMADIN) 5 MG tablet Take 5-7.5 mg by mouth as directed. 7.5mg  Mon and fri only 5mg  all other days     No current facility-administered medications for this visit.    Allergies:   Acetaminophen-codeine; Amoxicillin; and Diovan   Social History:  The patient  reports that he quit smoking about 47 years ago. His smoking use included Cigarettes. He has a 10 pack-year smoking history. He has never used smokeless tobacco. He reports that he drinks alcohol. He reports that he does not use illicit drugs.   Family History:  The patient's family history includes Colon cancer in his mother; Heart attack in his brother and mother; Leukemia in his father.   ROS:  Please see the history of present illness.    All other systems reviewed and negative.   PHYSICAL EXAM: VS:  BP 100/70 mmHg  Pulse 77  Ht 5\' 7"  (1.702 m)  Wt 270 lb 12.8 oz (122.834 kg)  BMI 42.40 kg/m2 Well nourished, well developed WM, in no acute distress HEENT: normal Neck: no JVD Cardiac:  normal S1, S2; RRR; no murmur Lungs:  clear to auscultation bilaterally, no wheezing, rhonchi or rales Abd: soft, nontender, no hepatomegaly Ext: no edema Skin: warm and dry Neuro:  moves all extremities spontaneously, no focal abnormalities noted  EKG:  NSR with intermittent atrial pacing 77bpm occasional PVCs, no acute ST-T changes  ASSESSMENT AND PLAN:  1. Shortness of breath 2. Chronic diastolic CHF 3. PVCs 4. CAD s/p prior PCI, CABG 11/2013 - deferring duration of ASA to Dr. Mayford Knifeurner since he is also on Coumadin 5. Paroxysmal atrial fibrillation, maintaining NSR 6. Mild aortic stenosis, timing of future echo to be determined by Dr. Mayford Knifeurner. 7. HTN  8. Bradycardia s/p Medtronic pacemaker 9. OSA  Earlier this month it sounds  like he did have symptoms of acute on chronic diastolic CHF which was mediated with increasing his Lasix dose. However, there also seems to be a component to his dyspnea that does not sound cardiac - he has history of multifactorial dyspnea. If this recurs we might consider changing metoprolol to bisoprolol given possible subclinical asthma on 11/2013 PFTs. His weight is on the downtrend and he is beginning to feel better. I recommended to continue Lasix 40mg  BID for now. Will check BMET/Mg to see where we stand with electrolytes and kidney function given his ocasional PVCs. His blood pressure tends to run low. I have asked him to decrease amlodipine to 2.5mg  daily. He says that Dr. Sherene SiresWert prescribed Hycodan but has a prescription for Vicoprofen on his medicine list. I have asked him to contact pulmonology to clarify this. He also reports close f/u with PCP for his mild anemia.  Dispo: F/u 6 weeks with Dr .Mayford Knifeurner.  Signed, Ronie Spiesayna Shivansh Hardaway, PA-C  04/17/2014 3:14 PM

## 2014-04-17 NOTE — Telephone Encounter (Signed)
Pt is aware to try off med for 3 days straight and see if cough comes back; will do and update [place] accordingly.

## 2014-04-17 NOTE — Patient Instructions (Signed)
Your physician has recommended you make the following change in your medication:  DECREASE AMOLDIPINE ( 2.5 MG ) DAILY-- YOU CAN TAKE ONE HALF TABLET TO USE THEM UP I REFILLED YOUR LASIX ( 40 MG ) TWICE A DAY OR AS DIRECTED METOPROLOL WAS ALSO SENT IN TODAY    Your physician recommends that you KEEP YOUR scheduled  follow-up appointment with Dr. [name]  Your physician recommends that you have  lab work today Mag/bmet  Please call Dr. Thurston Hole office to clarify your prescription of Hycodan

## 2014-04-17 NOTE — Telephone Encounter (Signed)
Yes it is, Try off 3 staight days of no cough at all

## 2014-04-17 NOTE — Telephone Encounter (Signed)
Spoke with patient-state he wants to make sure the Hycodan in his AVS is referring to to the HYDROcodone-ibuprofen [facility]) Rx and to let us know his cough is doing much better since using it. Would like to know if he should continue taking med or come off and see if his cough comes back.   MW Please advise. Thanks.

## 2014-04-20 ENCOUNTER — Telehealth: Payer: Self-pay | Admitting: Cardiology

## 2014-04-20 ENCOUNTER — Telehealth: Payer: Self-pay | Admitting: *Deleted

## 2014-04-20 ENCOUNTER — Encounter (HOSPITAL_COMMUNITY)
Admission: RE | Admit: 2014-04-20 | Discharge: 2014-04-20 | Disposition: A | Discharge status: Home / Self Care | Payer: Medicare Other | Source: Ambulatory Visit | Attending: Cardiology | Admitting: Cardiology

## 2014-04-20 DIAGNOSIS — Z951 Presence of aortocoronary bypass graft: Secondary | ICD-10-CM | POA: Diagnosis not present

## 2014-04-20 MED ORDER — METOPROLOL TARTRATE 25 MG PO TABS: 25.0000 mg | ORAL_TABLET | Freq: Two times a day (BID) | ORAL | Status: DC

## 2014-04-20 NOTE — Telephone Encounter (Signed)
PT CALLING  RE POSS GETTING THE WRONG MED , PLS CALL [phone] OR [phone]

## 2014-04-20 NOTE — Telephone Encounter (Signed)
lmptcb for lab results 

## 2014-04-20 NOTE — Telephone Encounter (Signed)
Pt st [facility] does not have refills for Metoprolol. Informed patient he has 6 refills available that were ordered 11/12. Patient st [facility] will not give him any as they say they have none. Sent another refills request.  Instructed patient to call with any questions.

## 2014-04-21 NOTE — Telephone Encounter (Signed)
pt notified about lab results and to continue on lasix 40 mg BID for now per Dr. [name] and Ronie Spies, PA, pt advised to increase dietary K+. Pt verbalized understanding to Plan of care.

## 2014-04-22 ENCOUNTER — Encounter (HOSPITAL_COMMUNITY)
Admission: RE | Admit: 2014-04-22 | Discharge: 2014-04-22 | Disposition: A | Discharge status: Home / Self Care | Payer: Medicare Other | Source: Ambulatory Visit | Attending: Cardiology | Admitting: Cardiology

## 2014-04-22 DIAGNOSIS — Z951 Presence of aortocoronary bypass graft: Secondary | ICD-10-CM | POA: Diagnosis not present

## 2014-04-24 ENCOUNTER — Encounter (HOSPITAL_COMMUNITY): Payer: Medicare Other

## 2014-04-27 ENCOUNTER — Ambulatory Visit (INDEPENDENT_AMBULATORY_CARE_PROVIDER_SITE_OTHER): Payer: Medicare Other | Admitting: Internal Medicine

## 2014-04-27 ENCOUNTER — Encounter: Payer: Self-pay | Admitting: Internal Medicine

## 2014-04-27 ENCOUNTER — Encounter (HOSPITAL_COMMUNITY)
Admission: RE | Admit: 2014-04-27 | Discharge: 2014-04-27 | Disposition: A | Discharge status: Home / Self Care | Payer: Medicare Other | Source: Ambulatory Visit | Attending: Cardiology | Admitting: Cardiology

## 2014-04-27 ENCOUNTER — Encounter (HOSPITAL_COMMUNITY): Payer: Medicare Other

## 2014-04-27 VITALS — BP 132/78 | HR 81 | Ht 67.0 in | Wt 267.0 lb

## 2014-04-27 DIAGNOSIS — R058 Other specified cough: Secondary | ICD-10-CM

## 2014-04-27 DIAGNOSIS — Z951 Presence of aortocoronary bypass graft: Secondary | ICD-10-CM | POA: Diagnosis not present

## 2014-04-27 DIAGNOSIS — R0600 Dyspnea, unspecified: Secondary | ICD-10-CM

## 2014-04-27 DIAGNOSIS — R05 Cough: Secondary | ICD-10-CM

## 2014-04-27 NOTE — Progress Notes (Signed)
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   History of Present Illness  04/13/2014 1st [facility] office visit/ Wert   Chief Complaint  Patient presents with  . Pulmonary Consult    Referred by Dr. Juluis RainierElizabeth Barnes. Pt c/o cough and SOB for the past 2-6 wks. He states that he is SOB any time- with or without any exertion. Cough is non prod. Breathing and cough seem worse when he lies down.   hoarseness started Holloweeen night comes and goes then really bad cough started 04/03/14  Assoc with sob proair didn't help Cough worse when lie down but never productive and settles down overnight and does not wake him up. Mostly sob when coughing, sob at rest when coughs real hard  rec Protonix 40 mg Take 30-60 min before first meal of the day and pepcid 20 mg at hs GERD diet  Prednisone 10 mg take  4 each am x 2 days,   2 each am x 2 days,  1 each am x 2 days and stop Take delsym two tsp every 12 hours and supplement if needed withl  Hycodan 7.5 on every 4 hours to suppress the urge to cough. Swallowing water or using ice chips/non mint and menthol containing candies (such as lifesavers or sugarless jolly ranchers) are also effective.  You should rest your voice and avoid activities that you know make you cough. Once you have eliminated the cough for 3 straight days try reducing the hycodan first,  then the delsym as tolerated.          04/27/2014 f/u  ov/Wert re: cough > sob  Chief Complaint  Patient presents with  . Follow-up    Cough is much improved- 85% better per pt. He states that his breathing is the same. No new co's today.   now that cough is better, able to get from first to second floor s sob, ranks both sob and cough as 85% better and no longer needs hydrocodan.   Still cough some at hs and in am but no excess mucus "not a problem"  No obvious day to day or daytime variabilty or assoc cp or chest tightness, subjective wheeze overt sinus or hb symptoms. No unusual exp hx or h/o childhood pna/ asthma or knowledge of premature birth.  Sleeping ok without nocturnal  exacerbation  of respiratory  c/o's or need for noct saba. Also denies any obvious fluctuation of symptoms with weather or environmental changes or other aggravating or alleviating factors except as outlined above   Current Medications, Allergies, Complete Past Medical History, Past Surgical History, Family History, and Social History were reviewed in [facility] electronic medical record.  ROS  The following are not active complaints unless bolded sore throat, dysphagia, dental problems, itching, sneezing,  nasal congestion or excess/  purulent secretions, ear ache,   fever, chills, sweats, unintended wt loss, pleuritic or exertional cp, hemoptysis,  orthopnea pnd or leg swelling, presyncope, palpitations, heartburn, abdominal pain, anorexia, nausea, vomiting, diarrhea  or change in bowel or urinary habits, change in stools or urine, dysuria,hematuria,  rash, arthralgias, visual complaints, headache, numbness weakness or ataxia or problems with walking or coordination,  change in mood/affect or memory.                            Objective:   Physical Exam  amb hoarse wm with  voice fatigue somewhat evasive historian no longer with pseudowheeze    04/27/2014      267  Wt Readings from Last 3 Encounters:  04/13/14 276 lb (125.193 kg)  03/05/14 264 lb  (119.75 kg)  02/19/14 260 lb (117.935 kg)    Vital signs reviewed   HEENT: nl dentition, turbinates, and orophanx. Nl external ear canals without cough reflex   NECK :  without JVD/Nodes/TM/ nl carotid upstrokes bilaterally   LUNGS: no acc muscle use, clear to A and P bilaterally without cough on insp or exp maneuvers   CV:  RRR  no s3 or ncrease in P2, no edema   II-III/ VI sem  ABD:  soft and nontender with nl excursion in the supine position. No bruits or organomegaly, bowel sounds nl  MS:  warm without deformities, calf tenderness, cyanosis or clubbing  SKIN: warm and dry without lesions    NEURO:  alert, approp, no deficits    CTa  04/02/14 No evidence of significant pulmonary embolus. Postoperative changes consistent with recent sternotomy and Coronary bypass grafting. Small bilateral  pleural effusions and basilar atelectasis.      Chemistry      Component Value Date/Time   NA 142 04/02/2014 1947   K 4.6 04/02/2014 1947   CL 105 04/02/2014 1947   CO2 22 04/02/2014 1947   BUN 19 04/02/2014 1947   CREATININE 1.11 04/02/2014 1947      Component Value Date/Time   CALCIUM 8.8 04/02/2014 1947   ALKPHOS 91 04/02/2014 1947   AST 47* 04/02/2014 1947   ALT 48 04/02/2014 1947   BILITOT 0.7 04/02/2014 1947        Lab Results  Component Value Date   TSH 1.350 11/27/2013     Lab Results  Component Value Date   PROBNP 535.0* 04/13/2014          Assessment & Plan:

## 2014-04-27 NOTE — Patient Instructions (Addendum)
No change in your medications for now  Return here after you've corrected your anemia if you not 100% satisfied with the cough or breathing

## 2014-04-28 DIAGNOSIS — R05 Cough: Secondary | ICD-10-CM | POA: Insufficient documentation

## 2014-04-28 DIAGNOSIS — R058 Other specified cough: Secondary | ICD-10-CM | POA: Insufficient documentation

## 2014-04-28 NOTE — Assessment & Plan Note (Addendum)
-   Rx for gerd/cyclical cough 04/13/14 > improved 04/27/14   Comment  Classic Upper airway cough syndrome, so named because it's frequently impossible to sort out how much is  CR/sinusitis with freq throat clearing (which can be related to primary GERD)   vs  causing  secondary (" extra esophageal")  GERD from wide swings in gastric pressure that occur with throat clearing, often  promoting self use of mint and menthol lozenges that reduce the lower esophageal sphincter tone and exacerbate the problem further in a cyclical fashion.   These are the same pts (now being labeled as having "irritable larynx syndrome" by some cough centers) who not infrequently have a history of having failed to tolerate ace inhibitors,  dry powder inhalers or biphosphonates or report having atypical reflux symptoms that don't respond to standard doses of PPI , and are easily confused as having aecopd or asthma flares by even experienced allergists/ pulmonologists.   He clearly does not have copd and symptoms better s directly treating his airways but simply by stopping cyclical cough and treating gerd ? Primary or secondary is hard to say. Has gi f/u for anemia/fe def w/u so pulmonary f/u can be prn

## 2014-04-28 NOTE — Assessment & Plan Note (Addendum)
-  PFTs 11/27/13 wnl -04/13/2014 Walked RA x 3 laps @ 185 ft each stopped due to Mild sob but nl pace and completed all 3 s desat       Likely related to anemia plus upper airway cough syndrome > much better since cough better / on Fe now with approp GI f/u planned

## 2014-04-29 ENCOUNTER — Encounter (HOSPITAL_COMMUNITY)
Admission: RE | Admit: 2014-04-29 | Discharge: 2014-04-29 | Disposition: A | Discharge status: Home / Self Care | Payer: Medicare Other | Source: Ambulatory Visit | Attending: Cardiology | Admitting: Cardiology

## 2014-04-29 ENCOUNTER — Other Ambulatory Visit: Payer: Self-pay | Admitting: Gastroenterology

## 2014-04-29 ENCOUNTER — Encounter (HOSPITAL_COMMUNITY): Payer: Self-pay | Admitting: *Deleted

## 2014-04-29 DIAGNOSIS — Z951 Presence of aortocoronary bypass graft: Secondary | ICD-10-CM | POA: Insufficient documentation

## 2014-05-01 ENCOUNTER — Encounter (HOSPITAL_COMMUNITY)
Admission: RE | Admit: 2014-05-01 | Discharge: 2014-05-01 | Disposition: A | Discharge status: Home / Self Care | Payer: Medicare Other | Source: Ambulatory Visit | Attending: Cardiology | Admitting: Cardiology

## 2014-05-01 ENCOUNTER — Encounter (HOSPITAL_COMMUNITY): Payer: Self-pay

## 2014-05-01 DIAGNOSIS — Z951 Presence of aortocoronary bypass graft: Secondary | ICD-10-CM | POA: Diagnosis not present

## 2014-05-01 NOTE — Progress Notes (Signed)
Pt graduated from cardiac rehab program today with completion of 36 exercise sessions in Phase II.   Pt maintained good attendance with exercise and education despite medical concerns.  Pt exercise ability diminished by his dyspnea evaluated and treated by pulmonary.     Medication list reconciled. Repeat  PHQ9 score-0.  Pt feels he has not fully  achieved his goals during cardiac rehab due to medical problems.  Pt has scheduled colonoscopy next week and then   Pt plans to continue exercise in cardiac maintenance program.   During exercise today pt had frequent premature ventricular contractions with pairs, 3-4 beat runs.  Pt denies palpitations, chest pain, dyspnea.  Only complaint is fatigue.  However pt had stressful day yesterday due to caring for sick dog and not resting well due to worry. pc to triage nurse, Bonita Quin to advise of rhythm and patient symptom of fatigue.   Rhythm strips faxed to Dr. Norris Cross office were review.    1600:  Addendum:  Spoke to Bonita Quin, Dr. Norris Cross triage nurse.  Strips not received via fax.  Will refax to [phone].   1640:  Addendum:  Spoke to Bonita Quin, Dr. Norris Cross triage nurse.  Strips still not received via fax.  However fax machines not operating properly today due to computer and telephone outage this morning.  Will resend fax to [phone] for Dr. [name] to review upon return to office.

## 2014-05-03 ENCOUNTER — Encounter (HOSPITAL_BASED_OUTPATIENT_CLINIC_OR_DEPARTMENT_OTHER): Payer: Medicare Other

## 2014-05-04 ENCOUNTER — Encounter: Payer: Self-pay | Admitting: Cardiology

## 2014-05-05 ENCOUNTER — Ambulatory Visit (HOSPITAL_COMMUNITY): Payer: Medicare Other | Admitting: Anesthesiology

## 2014-05-05 ENCOUNTER — Encounter (HOSPITAL_COMMUNITY): Payer: Self-pay | Admitting: *Deleted

## 2014-05-05 ENCOUNTER — Encounter (HOSPITAL_COMMUNITY)
Admission: RE | Disposition: A | Discharge status: Home / Self Care | Payer: Self-pay | Source: Ambulatory Visit | Attending: Gastroenterology

## 2014-05-05 ENCOUNTER — Ambulatory Visit (HOSPITAL_COMMUNITY)
Admission: RE | Admit: 2014-05-05 | Discharge: 2014-05-05 | Disposition: A | Discharge status: Home / Self Care | Payer: Medicare Other | Source: Ambulatory Visit | Attending: Gastroenterology | Admitting: Gastroenterology

## 2014-05-05 DIAGNOSIS — Z87891 Personal history of nicotine dependence: Secondary | ICD-10-CM | POA: Insufficient documentation

## 2014-05-05 DIAGNOSIS — G4733 Obstructive sleep apnea (adult) (pediatric): Secondary | ICD-10-CM | POA: Insufficient documentation

## 2014-05-05 DIAGNOSIS — E780 Pure hypercholesterolemia: Secondary | ICD-10-CM | POA: Diagnosis not present

## 2014-05-05 DIAGNOSIS — Z6841 Body Mass Index (BMI) 40.0 and over, adult: Secondary | ICD-10-CM | POA: Diagnosis not present

## 2014-05-05 DIAGNOSIS — Z7982 Long term (current) use of aspirin: Secondary | ICD-10-CM | POA: Insufficient documentation

## 2014-05-05 DIAGNOSIS — Z1211 Encounter for screening for malignant neoplasm of colon: Secondary | ICD-10-CM | POA: Insufficient documentation

## 2014-05-05 DIAGNOSIS — I10 Essential (primary) hypertension: Secondary | ICD-10-CM | POA: Diagnosis not present

## 2014-05-05 DIAGNOSIS — I5030 Unspecified diastolic (congestive) heart failure: Secondary | ICD-10-CM | POA: Diagnosis not present

## 2014-05-05 DIAGNOSIS — M1990 Unspecified osteoarthritis, unspecified site: Secondary | ICD-10-CM | POA: Diagnosis not present

## 2014-05-05 DIAGNOSIS — E119 Type 2 diabetes mellitus without complications: Secondary | ICD-10-CM | POA: Diagnosis not present

## 2014-05-05 DIAGNOSIS — I2510 Atherosclerotic heart disease of native coronary artery without angina pectoris: Secondary | ICD-10-CM | POA: Insufficient documentation

## 2014-05-05 DIAGNOSIS — I350 Nonrheumatic aortic (valve) stenosis: Secondary | ICD-10-CM | POA: Insufficient documentation

## 2014-05-05 DIAGNOSIS — Z950 Presence of cardiac pacemaker: Secondary | ICD-10-CM | POA: Diagnosis not present

## 2014-05-05 DIAGNOSIS — Z951 Presence of aortocoronary bypass graft: Secondary | ICD-10-CM | POA: Insufficient documentation

## 2014-05-05 DIAGNOSIS — K5730 Diverticulosis of large intestine without perforation or abscess without bleeding: Secondary | ICD-10-CM | POA: Diagnosis not present

## 2014-05-05 DIAGNOSIS — I482 Chronic atrial fibrillation: Secondary | ICD-10-CM | POA: Diagnosis not present

## 2014-05-05 DIAGNOSIS — K219 Gastro-esophageal reflux disease without esophagitis: Secondary | ICD-10-CM | POA: Diagnosis not present

## 2014-05-05 DIAGNOSIS — D509 Iron deficiency anemia, unspecified: Secondary | ICD-10-CM | POA: Diagnosis not present

## 2014-05-05 DIAGNOSIS — K317 Polyp of stomach and duodenum: Secondary | ICD-10-CM | POA: Insufficient documentation

## 2014-05-05 DIAGNOSIS — E785 Hyperlipidemia, unspecified: Secondary | ICD-10-CM | POA: Diagnosis not present

## 2014-05-05 DIAGNOSIS — I739 Peripheral vascular disease, unspecified: Secondary | ICD-10-CM | POA: Diagnosis not present

## 2014-05-05 HISTORY — PX: COLONOSCOPY WITH PROPOFOL: SHX5780

## 2014-05-05 HISTORY — PX: ESOPHAGOGASTRODUODENOSCOPY (EGD) WITH PROPOFOL: SHX5813

## 2014-05-05 SURGERY — ESOPHAGOGASTRODUODENOSCOPY (EGD) WITH PROPOFOL
Anesthesia: Monitor Anesthesia Care

## 2014-05-05 MED ORDER — LACTATED RINGERS IV SOLN
INTRAVENOUS | Status: DC
  Administered 2014-05-05: 1000 mL via INTRAVENOUS

## 2014-05-05 MED ORDER — PROPOFOL 10 MG/ML IV BOLUS
INTRAVENOUS | Status: AC
  Filled 2014-05-05: qty 20

## 2014-05-05 MED ORDER — LIDOCAINE HCL (CARDIAC) 20 MG/ML IV SOLN
INTRAVENOUS | Status: AC
  Filled 2014-05-05: qty 5

## 2014-05-05 MED ORDER — PROPOFOL 10 MG/ML IV BOLUS
INTRAVENOUS | Status: DC | PRN
  Administered 2014-05-05: 50 mg via INTRAVENOUS

## 2014-05-05 MED ORDER — PROPOFOL INFUSION 10 MG/ML OPTIME
INTRAVENOUS | Status: DC | PRN
  Administered 2014-05-05: 140 ug/kg/min via INTRAVENOUS

## 2014-05-05 MED ORDER — SODIUM CHLORIDE 0.9 % IV SOLN: INTRAVENOUS | Status: DC

## 2014-05-05 MED ORDER — BUTAMBEN-TETRACAINE-BENZOCAINE 2-2-14 % EX AERO
INHALATION_SPRAY | CUTANEOUS | Status: DC | PRN
  Administered 2014-05-05: 1 via TOPICAL

## 2014-05-05 SURGICAL SUPPLY — 24 items

## 2014-05-05 NOTE — Op Note (Signed)
Problem: Anemia (hemoglobin 10.4 g) and heme positive stool on Coumadin and aspirin therapy. Normal screening colonoscopies performed in 2003 and 2011. Moderate aortic valve stenosis and chronic atrial fibrillation. Diastolic heart failure with left ventricular ejection fraction 60%. Medtronic cardiac pacemaker placed in 2001 to treat bradycardia. Coronary artery bypass grafting in July 2015. No history of myocardial infarction. Severe obstructive sleep apnea syndrome.  Endoscopist: Danise Edge  Premedication: Propofol administered by anesthesia  Procedure: Diagnostic esophagogastroduodenoscopy The patient was placed in the left lateral decubitus position. The Pentax gastroscope was passed through the posterior hypopharynx into the proximal esophagus without difficulty. The hypopharynx, larynx, and vocal cords appeared normal.  Esophagoscopy: The proximal, mid, and lower segments of the esophageal mucosa appeared normal. The squamocolumnar junction was irregular in appearance and noted at approximately 53 cm from the incisor teeth.  Gastroscopy: There were many less than 5 mm benign fundic gland appearing polyps throughout the gastric fundus and gastric body. Retroflexed view of the gastric cardia and fundus was otherwise normal except for a patulous diaphragmatic hiatus. The gastric body, antrum, and pylorus otherwise appeared normal.  Duodenoscopy: In the proximal duodenal bulb was a broad-based pedunculated multilobulated villous-appearing polyp which wasn't intermittently prolapse through the pylorus into the distal gastric antrum. With some difficulty I biopsied the polyp. Otherwise the duodenal bulb, second portion of duodenum, and third portion of duodenum appeared normal.  Assessment: A 2 cm broad-based pedunculated villous-appearing polyp in the proximal duodenal bulb was biopsied. Otherwise normal esophagogastroduodenoscopy  Recommendation: I will refer the patient to Dr. Georges Mouse at  [facility] for duodenal bulb polypectomy.  Procedure: Diagnostic colonoscopy Anal inspection and digital rectal exam were normal. The Pentax pediatric colonoscope was introduced into the rectum and advanced to the cecum. A normal-appearing appendiceal orifice was identified. A normal-appearing ileocecal valve was identified. Colonic preparation for the exam today was good. Withdrawal time was 9 minutes  Rectum. Normal. Retroflexed view of the distal rectum normal  Sigmoid colon and descending colon. Left colonic diverticulosis  Splenic flexure. Normal  Transverse colon. Normal  Hepatic flexure. Normal  Ascending colon. Normal  Cecum and ileocecal valve. Normal  Assessment: Normal screening colonoscopy

## 2014-05-05 NOTE — Transfer of Care (Signed)
Immediate Anesthesia Transfer of Care Note  Patient: Brandon Klein  Procedure(s) Performed: Procedure(s): ESOPHAGOGASTRODUODENOSCOPY (EGD) WITH PROPOFOL (N/A) COLONOSCOPY WITH PROPOFOL (N/A)  Patient Location: PACU  Anesthesia Type:MAC  Level of Consciousness: awake, alert  and oriented  Airway & Oxygen Therapy: Patient Spontanous Breathing and Patient connected to nasal cannula oxygen  Post-op Assessment: Report given to PACU RN and Post -op Vital signs reviewed and stable  Post vital signs: Reviewed and stable  Complications: No apparent anesthesia complications

## 2014-05-05 NOTE — Anesthesia Postprocedure Evaluation (Signed)
Anesthesia Post Note  Patient: Brandon Klein  Procedure(s) Performed: Procedure(s) (LRB): ESOPHAGOGASTRODUODENOSCOPY (EGD) WITH PROPOFOL (N/A) COLONOSCOPY WITH PROPOFOL (N/A)  Anesthesia type: MAC  Patient location: PACU  Post pain: Pain level controlled  Post assessment: Post-op Vital signs reviewed  Last Vitals: BP 139/86 mmHg  Pulse 65  Temp(Src) 36.6 C (Oral)  Resp 16  SpO2 96%  Post vital signs: Reviewed  Level of consciousness: awake  Complications: No apparent anesthesia complications

## 2014-05-05 NOTE — H&P (Signed)
  Problem: Anemia (hemoglobin 10.4 g) and heme positive stool on Coumadin and aspirin therapy. Normal screening colonoscopies performed in 2003 and 2011. Moderate aortic valve stenosis. Chronic atrial fibrillation. Diastolic heart failure with left ventricular ejection fraction 60%. Medtronic cardiac pacemaker. Coronary artery bypass grafting in July 2015. No history of myocardial infarction. Severe obstructive sleep apnea syndrome.  History: The patient is a 73 year old male born 1941/05/21. He chronically takes Coumadin and aspirin daily. He has developed anemia with heme positive stool.  In July 2015, the patient underwent coronary artery bypass grafting surgery. He did not require aortic valve replacement surgery. He has never suffered a myocardial infarction.  In October 2015, the patient developed exertional shortness of breath without chest pain. He was evaluated by his cardiologist who found no cardiac explanation for his exertional shortness of breath. He was evaluated by his pulmonologist who prescribed prednisone Dosepak for asthmatic bronchitis. His shortness of breath improved somewhat.  He was evaluated by his primary care physician who obtained a hemoglobin which had fallen from 16 g to 10.4 g. He was prescribed oral iron therapy.  The patient denies abdominal pain or gastrointestinal bleeding. He underwent a normal screening colonoscopies in 2003 and 2011.  The patient stopped taking Coumadin approximately 6 days ago. He is scheduled to undergo diagnostic esophagogastroduodenoscopy and colonoscopy today.  Medication allergies: Amoxicillin causes rash. Diovan.  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 Coronary artery bypass grafting. Obesity. Hypertension. Hypercholesterolemia. Osteoarthritis of the knees and hips. Gastroesophageal reflux. Mild-moderate aortic valve stenosis. Type 2 diabetes mellitus. Severe obstructive sleep apnea syndrome.  Cholecystectomy. Left knee arthroscopy. Angioplasty in 1997. Coronary artery bypass grafting in July 2015.  Exam: The patient is alert and lying comfortably on the endoscopy stretcher. Abdomen is soft and nontender to palpation. Lungs are clear to auscultation. Cardiac exam reveals an irregular rhythm consistent with his diagnosis of chronic atrial fibrillation.  Plan: Proceed with diagnostic esophagogastroduodenoscopy and colonoscopy to evaluate heme positive stool with anemia

## 2014-05-05 NOTE — Anesthesia Preprocedure Evaluation (Signed)
Anesthesia Evaluation  Patient identified by MRN, date of birth, ID band Patient awake    Reviewed: Allergy & Precautions, H&P , NPO status , Patient's Chart, lab work & pertinent test results, reviewed documented beta blocker date and time   Airway Mallampati: II  TM Distance: >3 FB Neck ROM: full    Dental   Pulmonary shortness of breath, sleep apnea , former smoker,  breath sounds clear to auscultation        Cardiovascular hypertension, On Medications and Pt. on home beta blockers + angina + CAD, + Cardiac Stents, + Peripheral Vascular Disease and +CHF + dysrhythmias Atrial Fibrillation + pacemaker + Valvular Problems/Murmurs AS Rhythm:regular     Neuro/Psych negative neurological ROS  negative psych ROS   GI/Hepatic Neg liver ROS, GERD-  Medicated and Controlled,  Endo/Other  Morbid obesity  Renal/GU negative Renal ROS     Musculoskeletal  (+) Arthritis -,   Abdominal   Peds  Hematology negative hematology ROS (+)   Anesthesia Other Findings See surgeon's H&P   Reproductive/Obstetrics negative OB ROS                             Anesthesia Physical  Anesthesia Plan  ASA: III  Anesthesia Plan: MAC   Post-op Pain Management:    Induction: Intravenous  Airway Management Planned:   Additional Equipment:   Intra-op Plan:   Post-operative Plan:   Informed Consent: I have reviewed the patients History and Physical, chart, labs and discussed the procedure including the risks, benefits and alternatives for the proposed anesthesia with the patient or authorized representative who has indicated his/her understanding and acceptance.   Dental advisory given  Plan Discussed with: CRNA  Anesthesia Plan Comments:         Anesthesia Quick Evaluation

## 2014-05-06 ENCOUNTER — Encounter (HOSPITAL_COMMUNITY): Payer: Self-pay | Admitting: Gastroenterology

## 2014-05-07 ENCOUNTER — Telehealth: Payer: Self-pay | Admitting: Cardiology

## 2014-05-07 ENCOUNTER — Encounter (HOSPITAL_COMMUNITY): Payer: Self-pay | Admitting: Cardiology

## 2014-05-07 NOTE — Telephone Encounter (Signed)
Called spoke with pt, pt states GI MD consulted with Dr [name] and pt was advised to continue to hold Coumadin pending another colonoscopy and large polyp removal, date to be assigned.  Cancelled f/u INR appt on 05/15/14, r/s to 06/02/14 when pt seeing Dr [name].  Pt advised he should have INR checked in approx 7-10 days after resuming Coumadin.  Pt WCB if goes back on Coumadin prior to and needs sooner appt.

## 2014-05-07 NOTE — Telephone Encounter (Signed)
New message     Pt has been off coumadin since last week because of a colonoscopy.  They found a large polpy and needs to be removed by a specialist in [place].  It has not been removed yet.  Pt is still off coumadin.  He has an appt scheduled for 05-15-14.  Since he is off coumadin---when will he need to have a coumadin check?  He is still on aspirin.

## 2014-05-10 ENCOUNTER — Ambulatory Visit (HOSPITAL_BASED_OUTPATIENT_CLINIC_OR_DEPARTMENT_OTHER): Payer: Medicare Other | Attending: Cardiology

## 2014-05-10 VITALS — Ht 67.0 in | Wt 248.0 lb

## 2014-05-10 DIAGNOSIS — G4733 Obstructive sleep apnea (adult) (pediatric): Secondary | ICD-10-CM | POA: Insufficient documentation

## 2014-05-10 DIAGNOSIS — I493 Ventricular premature depolarization: Secondary | ICD-10-CM | POA: Diagnosis not present

## 2014-05-11 ENCOUNTER — Encounter: Payer: Self-pay | Admitting: Internal Medicine

## 2014-05-11 ENCOUNTER — Telehealth: Payer: Self-pay | Admitting: Cardiology

## 2014-05-11 NOTE — Telephone Encounter (Signed)
New Prob   Pt has some questions regarding going back on Coumadin after recent colonoscopy. Pt is due for another colonoscopy in about 1 month to remove a large polyp. Please call.

## 2014-05-11 NOTE — Telephone Encounter (Signed)
Pt st he had colonoscopy and endoscopy last Tues, Dec 8th. They found a large polyp and it will be removed in about 1 month by a specialist in [facility]. Pt is currently taking a baby ASA daily, but has not been taking his coumadin (the GI doctor never told him when to resume). Pt wants to know if he should resume coumadin therapy in the few weeks between now and his next procedure.  To Dr. [name] and Kennon Rounds for review and recommendations.

## 2014-05-12 NOTE — Telephone Encounter (Signed)
Please find out from GI MD if ok to restart coumadin

## 2014-05-13 ENCOUNTER — Telehealth: Payer: Self-pay | Admitting: Cardiology

## 2014-05-13 NOTE — Sleep Study (Signed)
   NAME: Brandon Klein DATE OF BIRTH:  [birth year] MEDICAL RECORD NUMBER [id number]  LOCATION: [facility]  PHYSICIAN: TURNER,TRACI R  DATE OF STUDY: 05/10/2014  SLEEP STUDY TYPE: Positive Airway Pressure Titration               REFERRING PHYSICIAN: [name], MD  INDICATION FOR STUDY: OSA  EPWORTH SLEEPINESS SCORE:   HEIGHT: 5\' 7"  (170.2 cm)  WEIGHT: 248 lb (112.492 kg)    Body mass index is 38.83 kg/(m^2).  NECK SIZE: 18 in.  MEDICATIONS: Reviewed in chart  SLEEP ARCHITECTURE: The patient slept for a total of 286 minutes with no slow wave sleep and 41 minutes of REM sleep.  The sleep onset latency was normal at 12 minutes and onset to REM latency was delayed at 120 minutes.  The sleep efficiency was reduced at 69%.  RESPIRATORY DATA: The patient was started on CPAP at 4cm H2O and pressure was titrated for respiratory events and snoring.  The patient was tried but unsuccessful on CPAP due to continued respiratory events and was started on BiPAP and titrated to 20/13cm H2O.  The AHI at at pressure of 20/47mmHg was 0.  THe patient was able to achieve REM sleep but unable to maintain the supine position for prolonged periods of time.    OXYGEN DATA: The O2 sat nadir during REM sleep was 92% and during NREM sleep was 89%.  There were no O2 desaturations below 88%.    CARDIAC DATA: The patient maintained NSR with occasional PVC's  MOVEMENT/PARASOMNIA: There were an increased number of periodic limb movements with a PLMS Index of 30.6 movements per hour.  There were no REM behavior disorders.    IMPRESSION/ RECOMMENDATION:   1.  Severe OSA with AHI of 70/hr.   2.  PVC's 3.  Unsuccessful CPAP titration due to continued respiratory events but successful BiPAP titration. 4.  Periodic limb movements with elevated index. 5.  Reduced sleep efficiency with increased frequency of arousals due to respiratory events.   6.  Mild to moderate snoring. 7.  Recommend ResMed  BiPAP with heated humidifier at 20/16cm H2O with Bi flex setting of 3 and medium Fisher Paykel Simplus full face mask. 8.  The patient should be counseled on good sleep hygiene and weight loss. 9.  The patient should be counseled to avoid sleeping supine.  Signed: [name] Diplomate, American Board of Sleep Medicine  ELECTRONICALLY SIGNED ON:  05/13/2014, 12:36 PM [facility] PH: [phone]   FX: [phone] ACCREDITED BY THE AMERICAN ACADEMY OF SLEEP MEDICINE

## 2014-05-13 NOTE — Addendum Note (Signed)
Addended by: Armanda Magic R on: 05/13/2014 12:47 PM   Modules accepted: Orders

## 2014-05-13 NOTE — Telephone Encounter (Signed)
Please let patient know that he had successful BiPAP titration and let [facility] know that order for BiPAP is in Epic.  Please set up OV with me in 10 weeks

## 2014-05-18 ENCOUNTER — Encounter: Payer: Self-pay | Admitting: Cardiology

## 2014-05-18 NOTE — Telephone Encounter (Signed)
Pt st that he was under the impression that Dr. Laural Benes and Dr. [name] talked and he was supposed to be taking a baby ASA only. He st that his second procedure is not scheduled yet, that he has to have a consult before he can have it done. Told patient I will F/U after speaking with Dr. Henriette Combs office since he did the procedure.  Left message for [name], Dr. Henriette Combs nurse, to call back.

## 2014-05-18 NOTE — Telephone Encounter (Signed)
This encounter was created in error - please disregard.

## 2014-05-18 NOTE — Telephone Encounter (Signed)
Left message to call back  

## 2014-05-18 NOTE — Telephone Encounter (Signed)
Spoke with [name]. She said she'd call back tomorrow with instructions for patient.

## 2014-05-18 NOTE — Telephone Encounter (Signed)
New Message     Patient is returning call that was placed to him. Please call patient back. THanks

## 2014-05-18 NOTE — Telephone Encounter (Signed)
Informed patient he had a successful BiPAP titration and he will hear from [facility] soon about equipment. Patient to make 10 week F/U appointment when he comes for his visit Jan. 5.

## 2014-05-20 ENCOUNTER — Encounter: Payer: Self-pay | Admitting: Cardiology

## 2014-05-20 NOTE — Telephone Encounter (Signed)
Called patient to ask if he had heard from [name] from Dr. Henriette Combs office. He said no. Patient st he "made the executive decision to restart his coumadin last night after a few week hiatus after his colonoscopy." Spoke with Tiffany, RN who spoke with patient and rescheduled his Coumadin Clinic appt to next week.  Pt has a consult Jan 6 with a GI specialist in [place]. His procedure is scheduled for Jan 12. He will most likely need clearance to stop Coumadin again for that.

## 2014-05-25 NOTE — Telephone Encounter (Signed)
Please call Dr. Henriette Combs office and find out if they want patient off coumadin until his procedure

## 2014-05-25 NOTE — Telephone Encounter (Signed)
Left message for [name] at Dr. Henriette Combs office to call back.

## 2014-05-25 NOTE — Telephone Encounter (Signed)
Spoke with [name] at Dr. Henriette Combs office. [name] st she spoke with patient 12/24 and instructed patient to remain off coumadin until GI bleeding resolves (see scanned fax from Dr. Henriette Combs office). [name] st she will talk to Brandon Klein again and confirm he is not taking it, as he informed [facility] on 12/23 that he was taking it and even made a Coumadin Clinic appointment to check INR. (This appointment has since been cancelled). Instructed [name] that it is Dr. Henriette Combs responsibility to restart Coumadin as soon as possible after procedures.  Instructed her to let us know when pt restarts medication so we can monitor it.

## 2014-06-01 ENCOUNTER — Encounter (HOSPITAL_COMMUNITY)
Admission: RE | Admit: 2014-06-01 | Discharge: 2014-06-01 | Disposition: A | Discharge status: Home / Self Care | Payer: Self-pay | Source: Ambulatory Visit | Attending: Cardiology | Admitting: Cardiology

## 2014-06-01 DIAGNOSIS — Z48812 Encounter for surgical aftercare following surgery on the circulatory system: Secondary | ICD-10-CM | POA: Insufficient documentation

## 2014-06-01 DIAGNOSIS — Z951 Presence of aortocoronary bypass graft: Secondary | ICD-10-CM | POA: Insufficient documentation

## 2014-06-02 ENCOUNTER — Ambulatory Visit (INDEPENDENT_AMBULATORY_CARE_PROVIDER_SITE_OTHER): Payer: Medicare Other | Admitting: Cardiology

## 2014-06-02 ENCOUNTER — Encounter: Payer: Self-pay | Admitting: Cardiology

## 2014-06-02 VITALS — BP 112/76 | HR 99 | Ht 67.0 in | Wt 259.0 lb

## 2014-06-02 DIAGNOSIS — R001 Bradycardia, unspecified: Secondary | ICD-10-CM

## 2014-06-02 DIAGNOSIS — I2510 Atherosclerotic heart disease of native coronary artery without angina pectoris: Secondary | ICD-10-CM

## 2014-06-02 DIAGNOSIS — E669 Obesity, unspecified: Secondary | ICD-10-CM

## 2014-06-02 DIAGNOSIS — I5032 Chronic diastolic (congestive) heart failure: Secondary | ICD-10-CM

## 2014-06-02 DIAGNOSIS — Z9861 Coronary angioplasty status: Secondary | ICD-10-CM

## 2014-06-02 DIAGNOSIS — R0600 Dyspnea, unspecified: Secondary | ICD-10-CM

## 2014-06-02 DIAGNOSIS — I480 Paroxysmal atrial fibrillation: Secondary | ICD-10-CM

## 2014-06-02 DIAGNOSIS — I10 Essential (primary) hypertension: Secondary | ICD-10-CM

## 2014-06-02 NOTE — Progress Notes (Signed)
[address] [place], [place]  [address] Phone: [phone] Fax:  [phone]  Date:  06/08/2014   ID:  Brandon Klein, DOB Jan 21, 1941, MRN [id number]  PCP:  Gaye Alken, MD  Cardiologist:  Armanda Magic, MD    History of Present Illness: Brandon Klein is a 74 y.o. male with a hx of CAD, status post prior stenting to the LAD and balloon angioplasty to the circumflex, paroxysmal atrial fibrillation, bradycardia s/p PPM, aortic stenosis, HTN, HL. He was recently seen with exertional chest discomfort and set up for stress testing. This was high risk and he was set up for cardiac catheterization. This demonstrated 3 vessel CAD and he was referred for CABG. Patient was admitted 6/30-7/10. He underwent CABG with Dr. Donata Clay (LIMA-LAD, SVG-diagonal, SVG-circumflex, SVG-PDA). He developed postoperative atrial fibrillation and was placed on amiodarone. He has normal LVF by echo 10/2013. Patient had volume overload and was sent home on Lasix. His weight came down nicely. However, after stopping this, his weight started to increase again. He was given another week of Lasix and then stopped it. He started to have fluid buildup again and is now on chronic Lasix.  Since I saw him last he was set up for sleep study which showed severe OSA and underwent CPAP titration but could not be adequately titrated and was set up for BIPAP titration.  He presents now for followup of his CAD.  He recently had some issues with GI bleeding and underwent endoscopy by Dr. 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  He was subsequently referred to Dr. Josetta Huddle at [facility] for duodenal bulb polypectomy.  He remains off coumadin until his procedure is completed.  He is still complaining of some SOB today.  He denies any chest pain.  He is very concerned over his GI issues.     Wt Readings from Last 3 Encounters:  06/02/14 259 lb  (117.482 kg)  05/10/14 248 lb (112.492 kg)  04/27/14 267 lb (121.11 kg)     Past Medical History  Diagnosis Date  . Syncope     a. vasovagal with documented bradycardia.  . Pacemaker     a. 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 b. MDT generator replacement 2012.  . Obesity   . HTN (hypertension)   . HLD (hyperlipidemia)   . DJD (degenerative joint disease)   . GERD (gastroesophageal reflux disease)   . Impaired fasting glucose     A1C check every year  . PAF (paroxysmal atrial fibrillation)     on chronic systemic anticoagulation  . Coronary artery disease     a. s/p PCI of LAD 1997. b. Cutting balloon angioplasty to LCx in 2002. c. s/p cath 11/2013 with severe 3 vessel ASCAD s/p CABG with LIMA to LAD, SVG to diag, SVG to left circ and SVG to PDA.  Marland Kitchen Postoperative atrial fibrillation   . AS (aortic stenosis)     mild by echo 10/2013  . Hypokalemia   . Hypomagnesemia   . Carotid artery plaque     a. Duplex 10/2013: mild calcific plaque origin ICA. Left: intimal wall thickening CCA. 0-39% BICA.  Marland Kitchen PVC's (premature ventricular contractions)   . OSA (obstructive sleep apnea)   . Chronic diastolic CHF (congestive heart failure)   . Shortness of breath dyspnea     Current Outpatient Prescriptions  Medication Sig Dispense Refill  .  amLODipine (NORVASC) 2.5 MG tablet Take 1 tablet (2.5 mg total) by mouth daily. (Patient taking differently: Take 2.5 mg by mouth every morning. ) 30 tablet 3  . aspirin EC 81 MG EC tablet Take 1 tablet (81 mg total) by mouth daily. (Patient taking differently: Take 81 mg by mouth every morning. )    . atorvastatin (LIPITOR) 20 MG tablet Take 20 mg by mouth daily at 6 PM.     . Cholecalciferol (VITAMIN D) 2000 UNITS tablet Take 2,000 Units by mouth daily.    . Cyanocobalamin (VITAMIN B-12 PO) Take 1 tablet by mouth every morning.    . docusate sodium (COLACE) 100 MG capsule Take 200 mg by mouth daily as needed for mild constipation.     . ergocalciferol (VITAMIN D2) 50000 UNITS capsule Take 50,000 Units by mouth once a week.    . ferrous sulfate 325 (65 FE) MG tablet Take 325 mg by mouth daily with breakfast.    . fexofenadine (ALLEGRA) 180 MG tablet Take 180 mg by mouth every morning.     . fluticasone (FLONASE) 50 MCG/ACT nasal spray Place 2 sprays into the nose every morning.     . furosemide (LASIX) 40 MG tablet Take 1 tablet (40 mg total) by mouth 2 (two) times daily. As directed 60 tablet 6  . Glucosamine-Chondroit-Vit C-Mn (GLUCOSAMINE CHONDR 1500 COMPLX) CAPS Take 1 capsule by mouth every morning.     Marland Kitchen L-Lysine 500 MG CAPS Take 1 capsule by mouth every morning.     . Magnesium Oxide 250 MG TABS Take 2 tablets (500 mg total) by mouth 2 (two) times daily. 120 tablet 6  . meclizine (ANTIVERT) 25 MG tablet Take 25 mg by mouth daily as needed for dizziness.     . metoprolol tartrate (LOPRESSOR) 25 MG tablet Take 1 tablet (25 mg total) by mouth 2 (two) times daily. 60 tablet 6  . Multiple Vitamin (MULTIVITAMIN) tablet Take 1 tablet by mouth every morning.     . nitroGLYCERIN (NITROSTAT) 0.4 MG SL tablet Place 1 tablet (0.4 mg total) under the tongue every 5 (five) minutes as needed for chest pain. 30 tablet 3  . ONE TOUCH ULTRA TEST test strip 1 each by Other route every morning.     Letta Pate DELICA LANCETS FINE MISC 1 each by Other route every morning.     . pantoprazole (PROTONIX) 40 MG tablet Take 40 mg by mouth every morning.     Marland Kitchen Phenylephrine-DM-GG-APAP (DELSYM COUGH/COLD DAYTIME PO) Take 10 mLs by mouth 2 (two) times daily as needed (cold).     . potassium chloride SA (K-DUR,KLOR-CON) 20 MEQ tablet Take 2 tablets (40 mEq total) by mouth 2 (two) times daily. 120 tablet 6   No current facility-administered medications for this visit.    Allergies:    Allergies  Allergen Reactions  . Acetaminophen-Codeine Other (See Comments)    Extreme constipation   . Amoxicillin Itching and Rash    Unknown-maybe a rash?    . Diovan [Valsartan] Other (See Comments)    Unknown     Social History:  The patient  reports that he quit smoking about 48 years ago. His smoking use included Cigarettes. He has a 10 pack-year smoking history. He has never used smokeless tobacco. He reports that he drinks alcohol. He reports that he does not use illicit drugs.   Family History:  The patient's family history includes Colon cancer in his mother; Heart attack in his brother and  mother; Leukemia in his father.   ROS:  Please see the history of present illness.      All other systems reviewed and negative.   PHYSICAL EXAM: VS:  BP 112/76 mmHg  Pulse 99  Ht 5\' 7"  (1.702 m)  Wt 259 lb (117.482 kg)  BMI 40.56 kg/m2 Well nourished, well developed, in no acute distress HEENT: normal Neck: no JVD Cardiac:  normal S1, S2; RRR; no murmur Lungs:  clear to auscultation bilaterally, no wheezing, rhonchi or rales Abd: soft, nontender, no hepatomegaly Ext: no edema Skin: warm and dry Neuro:  CNs 2-12 intact, no focal abnormalities noted  EKG:  NSR with PVC's     ASSESSMENT AND PLAN:  1. Shortness of breath - I suspect that his dyspnea is multifactorial and probably somewhat related to deconditioning from his surgery and anemia. I will check a BNP to assess further for volume overload 2. Chronic Diastolic CHF class II: He appears euvolemic - I will continue him on Lasix for diuresis  3. CAD s/p CABG 12/02/13:Continue aspirin, statin, beta blocker.  4. PAF (paroxysmal atrial fibrillation): Maintaining NSR. Continue Coumadin/BB.  5. Mild aortic stenosis: Consider followup echocardiogram in 1-2 years.  6. Essential hypertension: controlled. Continue amlodipine/BB 7. Dyslipidemia: LDL at goal at 42. Continue statin.  8. Bradycardia s/p MDT pacemaker: Follow up with the EP as planned.  9.  OSA s/p BiPAP titration to 20/16cm H2O - He will have followup with me for his sleep medicine in February  Followup with me in  February for sleep followup    Signed, Armanda Magic, MD [facility] 06/08/2014 11:00 PM

## 2014-06-02 NOTE — Patient Instructions (Addendum)
Your physician recommends that you have lab work TODAY (BNP)  Call our office after you have received your machine and we will schedule you a follow-up appointment.

## 2014-06-03 ENCOUNTER — Encounter (HOSPITAL_COMMUNITY)
Admission: RE | Admit: 2014-06-03 | Discharge: 2014-06-03 | Disposition: A | Discharge status: Home / Self Care | Payer: Self-pay | Source: Ambulatory Visit | Attending: Cardiology | Admitting: Cardiology

## 2014-06-03 ENCOUNTER — Telehealth: Payer: Self-pay | Admitting: Cardiology

## 2014-06-03 LAB — BRAIN NATRIURETIC PEPTIDE: Pro B Natriuretic peptide (BNP): 877 pg/mL — ABNORMAL HIGH (ref 0.0–100.0)

## 2014-06-03 NOTE — Telephone Encounter (Signed)
To Dr. Turner for review. 

## 2014-06-03 NOTE — Telephone Encounter (Signed)
New message      Request for surgical clearance:  1. What type of surgery is being performed? Upper endoscopy  2. When is this surgery scheduled? 06-09-14  3. Are there any medications that need to be held prior to surgery and how long? Warfarin---pt is already holding rx---needs clearance because of bypass in  July 2015  4. Name of physician performing surgery? Dr Georges Mouse   5. What is your office phone and fax number? Fax [phone]

## 2014-06-04 ENCOUNTER — Other Ambulatory Visit: Payer: Self-pay | Admitting: *Deleted

## 2014-06-04 DIAGNOSIS — I5032 Chronic diastolic (congestive) heart failure: Secondary | ICD-10-CM

## 2014-06-05 ENCOUNTER — Encounter (HOSPITAL_COMMUNITY)
Admission: RE | Admit: 2014-06-05 | Discharge: 2014-06-05 | Disposition: A | Discharge status: Home / Self Care | Payer: Self-pay | Source: Ambulatory Visit | Attending: Cardiology | Admitting: Cardiology

## 2014-06-08 ENCOUNTER — Telehealth: Payer: Self-pay | Admitting: Cardiology

## 2014-06-08 ENCOUNTER — Encounter (HOSPITAL_COMMUNITY)
Admission: RE | Admit: 2014-06-08 | Discharge: 2014-06-08 | Disposition: A | Discharge status: Home / Self Care | Payer: Self-pay | Source: Ambulatory Visit | Attending: Cardiology | Admitting: Cardiology

## 2014-06-08 NOTE — Telephone Encounter (Signed)
Patient was supposed to be set up with me 10 weeks after his BIPAP titration - did this get set up and has he been on his BiPAP

## 2014-06-08 NOTE — Telephone Encounter (Signed)
Please find out if this is going to be done under general anesthesia or conscious sedation

## 2014-06-09 NOTE — Telephone Encounter (Signed)
Per his OV last week, patient had not received machine. He is to call the office to schedule a F/U appointment after he receives his machine. Message sent to [facility] to F/U.

## 2014-06-10 ENCOUNTER — Encounter (HOSPITAL_COMMUNITY): Payer: Self-pay

## 2014-06-12 ENCOUNTER — Encounter (HOSPITAL_COMMUNITY): Payer: Self-pay

## 2014-06-12 ENCOUNTER — Other Ambulatory Visit (INDEPENDENT_AMBULATORY_CARE_PROVIDER_SITE_OTHER): Payer: Medicare Other | Admitting: *Deleted

## 2014-06-12 ENCOUNTER — Other Ambulatory Visit: Payer: Self-pay

## 2014-06-12 ENCOUNTER — Telehealth: Payer: Self-pay

## 2014-06-12 DIAGNOSIS — Z79899 Other long term (current) drug therapy: Secondary | ICD-10-CM

## 2014-06-12 DIAGNOSIS — E876 Hypokalemia: Secondary | ICD-10-CM

## 2014-06-12 DIAGNOSIS — I5032 Chronic diastolic (congestive) heart failure: Secondary | ICD-10-CM

## 2014-06-12 LAB — BASIC METABOLIC PANEL
BUN: 16 mg/dL (ref 6–23)
CHLORIDE: 104 meq/L (ref 96–112)
CO2: 28 meq/L (ref 19–32)
Calcium: 9.1 mg/dL (ref 8.4–10.5)
Creatinine, Ser: 1.25 mg/dL (ref 0.40–1.50)
GFR: 60.1 mL/min (ref >60.00)
Glucose, Bld: 161 mg/dL — ABNORMAL HIGH (ref 70–99)
Potassium: 3.1 mEq/L — ABNORMAL LOW (ref 3.5–5.1)
SODIUM: 138 meq/L (ref 135–145)

## 2014-06-12 MED ORDER — POTASSIUM CHLORIDE CRYS ER 20 MEQ PO TBCR: 40.0000 meq | EXTENDED_RELEASE_TABLET | Freq: Three times a day (TID) | ORAL | Status: DC

## 2014-06-12 NOTE — Telephone Encounter (Signed)
New Message  Pt called to inform Dr. [name] that he is back on coumadin. Had his procedure [place] [facility] [place] to remove the polip.Please call back to discuss furher

## 2014-06-12 NOTE — Telephone Encounter (Signed)
Instructed patient to INCREASE postassium to 20 meq 2 tablets TID. Repeat BMET ordered for next Friday.  Patient agrees with treatment plan.

## 2014-06-12 NOTE — Telephone Encounter (Signed)
Left message with patient's wife that since lab and Coumadin Clinic appointments are already, patient does not need to call back unless he would like to.  She agrees with treatment plan and is thankful for callback.

## 2014-06-12 NOTE — Telephone Encounter (Signed)
-----   Message from [name], MD sent at 06/12/2014  6:16 PM EST ----- Please make sure that patient has been taking his potassium suppl.  If he has and has not missed any doses then increase to 20 meq 2 tablets TID and recheck BMET in 1 week

## 2014-06-15 ENCOUNTER — Ambulatory Visit (INDEPENDENT_AMBULATORY_CARE_PROVIDER_SITE_OTHER): Payer: Medicare Other | Admitting: Internal Medicine

## 2014-06-15 ENCOUNTER — Ambulatory Visit (INDEPENDENT_AMBULATORY_CARE_PROVIDER_SITE_OTHER)
Admission: RE | Admit: 2014-06-15 | Discharge: 2014-06-15 | Disposition: A | Discharge status: Home / Self Care | Payer: Medicare Other | Source: Ambulatory Visit | Attending: Internal Medicine | Admitting: Internal Medicine

## 2014-06-15 ENCOUNTER — Encounter (HOSPITAL_COMMUNITY): Payer: Self-pay

## 2014-06-15 ENCOUNTER — Telehealth: Payer: Self-pay | Admitting: Internal Medicine

## 2014-06-15 ENCOUNTER — Encounter: Payer: Self-pay | Admitting: Internal Medicine

## 2014-06-15 ENCOUNTER — Other Ambulatory Visit (INDEPENDENT_AMBULATORY_CARE_PROVIDER_SITE_OTHER): Payer: Medicare Other

## 2014-06-15 VITALS — BP 120/84 | HR 80 | Temp 98.0°F | Ht 67.0 in | Wt 252.0 lb

## 2014-06-15 DIAGNOSIS — R0600 Dyspnea, unspecified: Secondary | ICD-10-CM

## 2014-06-15 DIAGNOSIS — R058 Other specified cough: Secondary | ICD-10-CM

## 2014-06-15 DIAGNOSIS — R05 Cough: Secondary | ICD-10-CM

## 2014-06-15 LAB — BASIC METABOLIC PANEL
BUN: 21 mg/dL (ref 6–23)
CALCIUM: 9.3 mg/dL (ref 8.4–10.5)
CO2: 27 mEq/L (ref 19–32)
Chloride: 103 mEq/L (ref 96–112)
Creatinine, Ser: 1.25 mg/dL (ref 0.40–1.50)
GFR: 60.09 mL/min (ref >60.00)
Glucose, Bld: 119 mg/dL — ABNORMAL HIGH (ref 70–99)
Potassium: 3.8 mEq/L (ref 3.5–5.1)
Sodium: 138 mEq/L (ref 135–145)

## 2014-06-15 LAB — CBC WITH DIFFERENTIAL/PLATELET
Basophils Absolute: 0 10*3/uL (ref 0.0–0.1)
Basophils Relative: 0.3 % (ref 0.0–3.0)
EOS PCT: 0.3 % (ref 0.0–5.0)
Eosinophils Absolute: 0 10*3/uL (ref 0.0–0.7)
HCT: 40.9 % (ref 39.0–52.0)
Hemoglobin: 12.8 g/dL — ABNORMAL LOW (ref 13.0–17.0)
LYMPHS ABS: 0.7 10*3/uL (ref 0.7–4.0)
Lymphocytes Relative: 11.9 % — ABNORMAL LOW (ref 12.0–46.0)
MCHC: 31.2 g/dL (ref 30.0–36.0)
MCV: 79 fl (ref 78.0–100.0)
MONO ABS: 0.6 10*3/uL (ref 0.1–1.0)
Monocytes Relative: 9.6 % (ref 3.0–12.0)
NEUTROS PCT: 77.9 % — AB (ref 43.0–77.0)
Neutro Abs: 4.9 10*3/uL (ref 1.4–7.7)
Platelets: 147 10*3/uL — ABNORMAL LOW (ref 150.0–400.0)
RBC: 5.18 Mil/uL (ref 4.22–5.81)
RDW: 23.6 % — AB (ref 11.5–15.5)
WBC: 6.3 10*3/uL (ref 4.0–10.5)

## 2014-06-15 LAB — BRAIN NATRIURETIC PEPTIDE: PRO B NATRI PEPTIDE: 1300 pg/mL — AB (ref 0.0–100.0)

## 2014-06-15 MED ORDER — PREDNISONE 10 MG PO TABS: ORAL_TABLET | ORAL | Status: DC

## 2014-06-15 MED ORDER — TRAMADOL HCL 50 MG PO TABS: ORAL_TABLET | ORAL | Status: DC

## 2014-06-15 NOTE — Telephone Encounter (Signed)
Called pt and appt scheduled this afternoon

## 2014-06-15 NOTE — Telephone Encounter (Signed)
Yes but we don't use that regimen over the phone for a recurrent cough, would need ov first to make certain what it is we're treating

## 2014-06-15 NOTE — Patient Instructions (Addendum)
Protonix 40 mg Take 30-60 min before first meal of the day and pepcid 20 mg at bedtime   GERD (REFLUX)  is an extremely common cause of respiratory symptoms just like yours , many times with no obvious heartburn at all.    It can be treated with medication, but also with lifestyle changes including avoidance of late meals, excessive alcohol, smoking cessation, and avoid fatty foods, chocolate, peppermint, colas, red wine, and acidic juices such as orange juice.  NO MINT OR MENTHOL PRODUCTS SO NO COUGH DROPS  USE SUGARLESS CANDY INSTEAD (Jolley ranchers or Stover's or Life Savers) or even ice chips will also do - the key is to swallow to prevent all throat clearing. NO OIL BASED VITAMINS - use powdered substitutes.  Prednisone 10 mg take  4 each am x 2 days,   2 each am x 2 days,  1 each am x 2 days and stop  Take delsym two tsp every 12 hours and supplement if needed with tramadol 50 up to 2    every 4 hours to suppress the urge to cough. Swallowing water or using ice chips/non mint and menthol containing candies (such as lifesavers or sugarless jolly ranchers) are also effective.  You should rest your voice and avoid activities that you know make you cough.  Once you have eliminated the cough for 3 straight days try reducing the tramadol first,  then the delsym as tolerated.      Please remember to go to the lab and x-ray department downstairs for your tests - we will call you with the results when they are available.   If not 100% better, first return to see Tammy NP with all meds in hand including over the counters

## 2014-06-15 NOTE — Telephone Encounter (Signed)
Pt states that about 6 weeks ago he had a procedure to remove a stomach polyp. Anemia coming from possible stomach/polyp bleeding- mild anemia dx prior to procedure.  Pt states that since surgery his anemia has improved--levels are almost back to baseline. Pt states that his cough returned and has been worsening since surgery.  Pt wanting to know if he can be placed back on the cough regimen he was on before : Hycodan, Protonix, Pepcid and Prednisone. Please advise Dr Sherene Sires, thanks.  Allergies  Allergen Reactions  . Acetaminophen-Codeine Other (See Comments)    Extreme constipation   . Amoxicillin Itching and Rash    Unknown-maybe a rash?  . Diovan [Valsartan] Other (See Comments)    Unknown

## 2014-06-15 NOTE — Progress Notes (Signed)
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   History of Present Illness  04/13/2014 1st [facility] office visit/ Brandon Klein   Chief Complaint  Patient presents with  . Pulmonary Consult    Referred by Dr. Juluis Rainier. Pt c/o cough and SOB for the past 2-6 wks. He states that he is SOB any time- with or without any exertion. Cough is non prod. Breathing and cough seem worse when he lies down.   hoarseness started Holloweeen night comes and goes then really bad cough started 04/03/14  Assoc with sob proair didn't help Cough worse when lie down but never productive and settles down overnight and does not wake him up. Mostly sob when coughing, sob at rest when coughs real hard  rec Protonix 40 mg Take 30-60 min before first meal of the day and pepcid 20 mg at hs GERD diet  Prednisone 10 mg take  4 each am x 2 days,   2 each am x 2 days,  1 each am x 2 days and stop Take delsym two tsp every 12 hours and supplement if needed withl  Hycodan 7.5 on every 4 hours to suppress the urge to cough. Swallowing water or using ice chips/non mint and menthol containing candies (such as lifesavers or sugarless jolly ranchers) are also effective.  You should rest your voice and avoid activities that you know make you cough. Once you have eliminated the cough for 3 straight days try reducing the hycodan first,  then the delsym as tolerated.          04/27/2014 f/u  ov/Brandon Klein re: cough > sob  Chief Complaint  Patient presents with  . Follow-up    Cough is much improved- 85% better per pt. He states that his breathing is the same. No new co's today.   now that cough is better, able to get from first to second floor s sob, ranks both sob and cough as 85% better and no longer needs hydrocodan.   Still cough some at hs and in am but no excess mucus "not a problem" rec No change rx until fix anemia first.   06/15/2014 1st [facility] office visit/ Brandon Klein  Re recurrent cough while on ppi qam only  Chief Complaint  Patient presents with  . Acute Visit    Pt c/o cough x 2 days- non prod.   surgery was one week prior to OV  > gastric polyp with hgb 12.3 and not checked since and doe worse Stools are dark on fe  Sitting in chair "doing nothing" and experience acute onset of cough 06/11/13, worse if try to lie back to sleeps in recline/ dry hacking quality 24/7      No obvious day to day or daytime variabilty or assoc cp or chest tightness, subjective  wheeze overt sinus or hb symptoms. No unusual exp hx or h/o childhood pna/ asthma or knowledge of premature birth.  Sleeping ok without nocturnal  exacerbation  of respiratory  c/o's or need for noct saba. Also denies any obvious fluctuation of symptoms with weather or environmental changes or other aggravating or alleviating factors except as outlined above   Current Medications, Allergies, Complete Past Medical History, Past Surgical History, Family History, and Social History were reviewed in [employer] record.  ROS  The following are not active complaints unless bolded sore throat, dysphagia, dental problems, itching, sneezing,  nasal congestion or excess/ purulent secretions, ear ache,   fever, chills, sweats, unintended wt loss, pleuritic or exertional cp, hemoptysis,  orthopnea pnd or leg swelling, presyncope, palpitations, heartburn, abdominal pain, anorexia, nausea, vomiting,  diarrhea  or change in bowel or urinary habits, change in stools or urine, dysuria,hematuria,  rash, arthralgias, visual complaints, headache, numbness weakness or ataxia or problems with walking or coordination,  change in mood/affect or memory.                           Objective:   Physical Exam  amb hoarse wm with  voice fatigue somewhat evasive historian     04/27/2014      267 > 06/15/2014  252  Wt Readings from Last 3 Encounters:  04/13/14 276 lb (125.193 kg)  03/05/14 264 lb (119.75 kg)  02/19/14 260 lb (117.935 kg)    Vital signs reviewed   HEENT: nl dentition, turbinates, and orophanx. Nl external ear canals without cough reflex   NECK :  without JVD/Nodes/TM/ nl carotid upstrokes bilaterally   LUNGS: no acc muscle use, decreased bs L base  without cough on insp or exp maneuvers   CV:  RRR  no s3 or ncrease in P2, no edema   II-III/ VI sem  ABD:  soft and nontender with nl excursion in the supine position. No bruits or organomegaly, bowel sounds nl  MS:  warm without deformities, calf tenderness, cyanosis or clubbing  SKIN: warm and dry without lesions    NEURO:  alert, approp, no deficits                 Labs ordered today / images reviewed include:    cxr :  cardiomegaly with L effusion s change  .  Recent Labs Lab 06/12/14 1507 06/15/14 1535  NA 138 138  K 3.1* 3.8  CL 104 103  CO2 28 27  BUN 16 21  CREATININE 1.25 1.25  GLUCOSE 161* 119*    Recent Labs Lab 06/15/14 1535  HGB 12.8*  HCT 40.9  WBC 6.3  PLT 147.0*     Lab Results  Component Value Date   TSH 1.350 11/27/2013     Lab Results  Component Value Date   PROBNP 1300.0* 06/15/2014              Assessment & Plan:

## 2014-06-16 ENCOUNTER — Encounter: Payer: Self-pay | Admitting: *Deleted

## 2014-06-16 ENCOUNTER — Other Ambulatory Visit: Payer: Self-pay | Admitting: Internal Medicine

## 2014-06-16 ENCOUNTER — Encounter: Payer: Self-pay | Admitting: Internal Medicine

## 2014-06-16 DIAGNOSIS — R0600 Dyspnea, unspecified: Secondary | ICD-10-CM

## 2014-06-16 LAB — ALLERGY FULL PROFILE
ALTERNARIA ALTERNATA: 0.13 kU/L — AB
ASPERGILLUS FUMIGATUS M3: 0.13 kU/L — AB
Allergen, D pternoyssinus,d7: 1.22 kU/L — ABNORMAL HIGH
Allergen,Goose feathers, e70: 0.12 kU/L — ABNORMAL HIGH
BAHIA GRASS: 3.04 kU/L — AB
BERMUDA GRASS: 2.09 kU/L — AB
Box Elder IgE: 2.17 kU/L — ABNORMAL HIGH
Candida Albicans: <0.1 kU/L
Cat Dander: 0.13 kU/L — ABNORMAL HIGH
Common Ragweed: 2.31 kU/L — ABNORMAL HIGH
Curvularia lunata: 0.1 kU/L — ABNORMAL HIGH
D. farinae: 1.7 kU/L — ABNORMAL HIGH
DOG DANDER: 0.43 kU/L — AB
Elm IgE: 2.25 kU/L — ABNORMAL HIGH
FESCUE: 3.21 kU/L — AB
G005 Rye, Perennial: 3.24 kU/L — ABNORMAL HIGH
G009 Red Top: 3.35 kU/L — ABNORMAL HIGH
Goldenrod: 1.99 kU/L — ABNORMAL HIGH
House Dust Hollister: 0.43 kU/L — ABNORMAL HIGH
IGE (IMMUNOGLOBULIN E), SERUM: 108 kU/L (ref <115)
LAMB'S QUARTERS CLASS: 2.12 kU/L — AB
OAK CLASS: 2.05 kU/L — AB
Plantain: 2.01 kU/L — ABNORMAL HIGH
SYCAMORE TREE: 2.25 kU/L — AB
Timothy Grass: 3.17 kU/L — ABNORMAL HIGH

## 2014-06-16 MED ORDER — MONTELUKAST SODIUM 10 MG PO TABS: 10.0000 mg | ORAL_TABLET | Freq: Every day | ORAL | Status: DC

## 2014-06-16 NOTE — Progress Notes (Signed)
Quick Note:  Spoke with pt and notified of results per Dr. Wert. Pt verbalized understanding and denied any questions.  ______ 

## 2014-06-16 NOTE — Assessment & Plan Note (Signed)
04/13/2014  Walked RA x 3 laps @ 185 ft each stopped due to  Mild sob but nl pace and completed all 3 s desat - 06/15/2014   Walked RA x one lap @ 185 stopped due to  Sob/ desat to 87%   cxr most concerning for chf with cm and with bnp >> baseline > rec repeat Echo next step

## 2014-06-16 NOTE — Assessment & Plan Note (Addendum)
-   Rx for gerd/cyclical cough 04/13/14 > improved 04/27/14 >relapsed 06/13/13   The abrupt onset of the cough is not typical of a cyclical cough but otherwise has the usual features of a dry hacking severe nature ? What was the trigger. In any case Symptoms are markedly disproportionate to objective findings and not clear this is a lung problem but pt does appear to have difficult airway management issues. DDX of  difficult airways management all start with A and  include Adherence, Ace Inhibitors, Acid Reflux, Active Sinus Disease, Alpha 1 Antitripsin deficiency, Anxiety masquerading as Airways dz,  ABPA,  allergy(esp in young), Aspiration (esp in elderly), Adverse effects of DPI,  Active smokers, plus two Bs  = Bronchiectasis and Beta blocker use..and one C= CHF  Adherence is always the initial "prime suspect" and is a multilayered concern that requires a "trust but verify" approach in every patient - starting with knowing how to use medications, especially inhalers, correctly, keeping up with refills and understanding the fundamental difference between maintenance and prns vs those medications only taken for a very short course and then stopped and not refilled.  - not clear to me he really understands how to take his meds -  To keep things simple, I have asked the patient to first separate medicines that are perceived as maintenance, that is to be taken daily "no matter what", from those medicines that are taken on only on an as-needed basis and I have given the patient examples of both, and then return to see our NP to generate a  detailed  medication calendar which should be followed until the next physician sees the patient and updates it.     ? Acid (or non-acid) GERD > always difficult to exclude as up to 75% of pts in some series report no assoc GI/ Heartburn symptoms> rec max (24h)  acid suppression and diet restrictions/ reviewed and instructions given in writing.  ? Allergy/ asthmatic >  Prednisone 10 mg take  4 each am x 2 days,   2 each am x 2 days,  1 each am x 2 days and stop    ? chf with bnp up > repeat echo  See instructions for specific recommendations which were reviewed directly with the patient who was given a copy with highlighter outlining the key components.

## 2014-06-17 ENCOUNTER — Encounter (HOSPITAL_COMMUNITY)
Admission: RE | Admit: 2014-06-17 | Discharge: 2014-06-17 | Disposition: A | Discharge status: Home / Self Care | Payer: Self-pay | Source: Ambulatory Visit | Attending: Cardiology | Admitting: Cardiology

## 2014-06-17 ENCOUNTER — Other Ambulatory Visit (HOSPITAL_COMMUNITY): Payer: Self-pay | Admitting: Internal Medicine

## 2014-06-17 ENCOUNTER — Ambulatory Visit (INDEPENDENT_AMBULATORY_CARE_PROVIDER_SITE_OTHER): Payer: Medicare Other | Admitting: *Deleted

## 2014-06-17 DIAGNOSIS — I4891 Unspecified atrial fibrillation: Secondary | ICD-10-CM

## 2014-06-17 DIAGNOSIS — R0600 Dyspnea, unspecified: Secondary | ICD-10-CM

## 2014-06-17 LAB — POCT INR: INR: 2

## 2014-06-17 MED ORDER — WARFARIN SODIUM 5 MG PO TABS: ORAL_TABLET | ORAL | Status: DC

## 2014-06-18 ENCOUNTER — Other Ambulatory Visit (INDEPENDENT_AMBULATORY_CARE_PROVIDER_SITE_OTHER): Payer: Medicare Other | Admitting: *Deleted

## 2014-06-18 ENCOUNTER — Ambulatory Visit (HOSPITAL_COMMUNITY): Payer: Medicare Other | Attending: Internal Medicine | Admitting: Radiology

## 2014-06-18 DIAGNOSIS — Z6839 Body mass index (BMI) 39.0-39.9, adult: Secondary | ICD-10-CM | POA: Diagnosis not present

## 2014-06-18 DIAGNOSIS — R0600 Dyspnea, unspecified: Secondary | ICD-10-CM | POA: Insufficient documentation

## 2014-06-18 DIAGNOSIS — E785 Hyperlipidemia, unspecified: Secondary | ICD-10-CM | POA: Diagnosis not present

## 2014-06-18 DIAGNOSIS — I350 Nonrheumatic aortic (valve) stenosis: Secondary | ICD-10-CM | POA: Diagnosis not present

## 2014-06-18 DIAGNOSIS — I10 Essential (primary) hypertension: Secondary | ICD-10-CM | POA: Insufficient documentation

## 2014-06-18 LAB — BASIC METABOLIC PANEL
BUN: 34 mg/dL — ABNORMAL HIGH (ref 6–23)
CO2: 25 meq/L (ref 19–32)
CREATININE: 1.64 mg/dL — AB (ref 0.40–1.50)
Calcium: 9.2 mg/dL (ref 8.4–10.5)
Chloride: 103 mEq/L (ref 96–112)
GFR: 43.93 mL/min — AB (ref >60.00)
Glucose, Bld: 212 mg/dL — ABNORMAL HIGH (ref 70–99)
Potassium: 4 mEq/L (ref 3.5–5.1)
Sodium: 137 mEq/L (ref 135–145)

## 2014-06-18 NOTE — Progress Notes (Signed)
Echocardiogram performed.  

## 2014-06-18 NOTE — Addendum Note (Signed)
Addended by: Tonita Phoenix on: 06/18/2014 02:49 PM   Modules accepted: Orders

## 2014-06-19 ENCOUNTER — Other Ambulatory Visit: Payer: Medicare Other

## 2014-06-19 ENCOUNTER — Encounter (HOSPITAL_COMMUNITY): Payer: Self-pay

## 2014-06-22 ENCOUNTER — Telehealth: Payer: Self-pay

## 2014-06-22 ENCOUNTER — Encounter (HOSPITAL_COMMUNITY)
Admission: RE | Admit: 2014-06-22 | Discharge: 2014-06-22 | Disposition: A | Discharge status: Home / Self Care | Payer: Self-pay | Source: Ambulatory Visit | Attending: Cardiology | Admitting: Cardiology

## 2014-06-22 MED ORDER — ISOSORBIDE MONONITRATE ER 30 MG PO TB24: 30.0000 mg | ORAL_TABLET | Freq: Every day | ORAL | Status: DC

## 2014-06-22 MED ORDER — CARVEDILOL 3.125 MG PO TABS: 3.1250 mg | ORAL_TABLET | Freq: Two times a day (BID) | ORAL | Status: DC

## 2014-06-22 MED ORDER — HYDRALAZINE HCL 10 MG PO TABS: 10.0000 mg | ORAL_TABLET | Freq: Three times a day (TID) | ORAL | Status: DC

## 2014-06-22 NOTE — Telephone Encounter (Signed)
Notes Recorded by [name], MD on 06/21/2014 at 10:26 AM Please let patient know that echo showed severely reduced LVF of unclear etiology. His EF has gone from normal pre CABG to 20%. Please have him stop amlodipine and lopressor. Please start Coreg 3.125mg  BID and add Hydralazine 10mg  TID and Imdur 30mg  daily. Cannot useACE I/ ARB due to renal insuff. I would like to see him in a week. Please find out if his endo procedure is under conscious sedation or general  Instructed patient to: 1) STOP amlodipine and lopressor 2) START coreg 3.125 mg BID, hydralazine 10 mg TID and Imdur 30 mg daily 3) DECREASE Lasix back to 40 mg BID   Appointment scheduled for Friday, Feb 5th at 1100 per patient request. He st he will get his lab work done after his appointment.

## 2014-06-22 NOTE — Telephone Encounter (Signed)
-----   Message from [name], MD sent at 06/21/2014 10:27 AM EST ----- Please find out if patient is feeling better after increase in Lasix and if SOB has resolved.  If SOB has improved please have him decrease Lasix back to 40mg  BID and recheck BMET in 1 week since creatinine has increased on higher dose

## 2014-06-24 ENCOUNTER — Encounter (HOSPITAL_COMMUNITY)
Admission: RE | Admit: 2014-06-24 | Discharge: 2014-06-24 | Disposition: A | Discharge status: Home / Self Care | Payer: Self-pay | Source: Ambulatory Visit | Attending: Cardiology | Admitting: Cardiology

## 2014-06-26 ENCOUNTER — Encounter (HOSPITAL_COMMUNITY)
Admission: RE | Admit: 2014-06-26 | Discharge: 2014-06-26 | Disposition: A | Discharge status: Home / Self Care | Payer: Self-pay | Source: Ambulatory Visit | Attending: Cardiology | Admitting: Cardiology

## 2014-06-29 ENCOUNTER — Encounter (HOSPITAL_COMMUNITY)
Admission: RE | Admit: 2014-06-29 | Discharge: 2014-06-29 | Disposition: A | Discharge status: Home / Self Care | Payer: Self-pay | Source: Ambulatory Visit | Attending: Cardiology | Admitting: Cardiology

## 2014-06-29 DIAGNOSIS — I5023 Acute on chronic systolic (congestive) heart failure: Secondary | ICD-10-CM | POA: Insufficient documentation

## 2014-07-01 ENCOUNTER — Encounter (HOSPITAL_COMMUNITY)
Admission: RE | Admit: 2014-07-01 | Discharge: 2014-07-01 | Disposition: A | Discharge status: Home / Self Care | Payer: Self-pay | Source: Ambulatory Visit | Attending: Cardiology | Admitting: Cardiology

## 2014-07-02 ENCOUNTER — Encounter: Payer: Self-pay | Admitting: Adult Health

## 2014-07-02 ENCOUNTER — Telehealth: Payer: Self-pay | Admitting: Adult Health

## 2014-07-02 ENCOUNTER — Ambulatory Visit (INDEPENDENT_AMBULATORY_CARE_PROVIDER_SITE_OTHER): Payer: Medicare Other | Admitting: Adult Health

## 2014-07-02 VITALS — BP 140/90 | HR 82 | Temp 97.3°F | Ht 67.0 in | Wt 254.1 lb

## 2014-07-02 DIAGNOSIS — R058 Other specified cough: Secondary | ICD-10-CM

## 2014-07-02 DIAGNOSIS — R0600 Dyspnea, unspecified: Secondary | ICD-10-CM

## 2014-07-02 DIAGNOSIS — R05 Cough: Secondary | ICD-10-CM

## 2014-07-02 NOTE — Assessment & Plan Note (Signed)
Cyclical cough  Needs PFT with hx of smoking .  Cont to tx for triggers and cough control .

## 2014-07-02 NOTE — Assessment & Plan Note (Signed)
Suspect is multiactoral in nature  New Dx CHF is probably contributing to his symptoms.  He is recommended to follow up with cardiology tomorrow as planned

## 2014-07-02 NOTE — Telephone Encounter (Signed)
Pt saw TP today for visit. Pt reports we put him on an allergy medication singulair. He is still on this. Advised pt this is on his med list. Nothing further needed

## 2014-07-02 NOTE — Progress Notes (Signed)
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   History of Present Illness  04/13/2014 1st [facility] office visit/ Wert   Chief Complaint  Patient presents with  . Pulmonary Consult    Referred by Dr. Juluis Rainier. Pt c/o cough and SOB for the past 2-6 wks. He states that he is SOB any time- with or without any exertion. Cough is non prod. Breathing and cough seem worse when he lies down.   hoarseness started Holloweeen night comes and goes then really bad cough started 04/03/14  Assoc with sob proair didn't help Cough worse when lie down but never productive and settles down overnight and does not wake him up. Mostly sob when coughing, sob at rest when coughs real hard  rec Protonix 40 mg Take 30-60 min before first meal of the day and pepcid 20 mg at hs GERD diet  Prednisone 10 mg take  4 each am x 2 days,   2 each am x 2 days,  1 each am x 2 days and stop Take delsym two tsp every 12 hours and supplement if needed withl  Hycodan 7.5 on every 4 hours to suppress the urge to cough. Swallowing water or using ice chips/non mint and menthol containing candies (such as lifesavers or sugarless jolly ranchers) are also effective.  You should rest your voice and avoid activities that you know make you cough. Once you have eliminated the cough for 3 straight days try reducing the hycodan first,  then the delsym as tolerated.          04/27/2014 f/u  ov/Wert re: cough > sob  Chief Complaint  Patient presents with  . Follow-up    Cough is much improved- 85% better per pt. He states that his breathing is the same. No new co's today.   now that cough is better, able to get from first to second floor s sob, ranks both sob and cough as 85% better and no longer needs hydrocodan.   Still cough some at hs and in am but no excess mucus "not a problem" rec No change rx until fix anemia first.   06/15/2014 1st [facility] office visit/ Wert  Re recurrent cough while on ppi qam only  Chief Complaint  Patient presents with  . Acute Visit    Pt c/o cough x 2 days- non prod.   surgery was one week prior to OV  > gastric polyp with hgb 12.3 and not checked since and doe worse Stools are dark on fe  Sitting in chair "doing nothing" and experience acute onset of cough 06/11/13, worse if try to lie back to sleeps in recline/ dry hacking quality 24/7      07/02/2014 Follow up:   Re recurrent cough Rodena Goldmann  Returns for 2 week follow  up .   Last visit with cough flare and dyspnea.  Labs showed elevated BNP , echo showed severely reduced LVF , EF at 20% Referred to cardiology. Started on Coreg and Hydralazine. And Imdur.  He has ov with cards tomorrow.  Feels cough is better.  Recently dx with OSA , having trouble wearing CPAP .    Current Medications, Allergies, Complete Past Medical History, Past Surgical History, Family History, and Social History were reviewed in [employer] record.  ROS  The following are not active complaints unless bolded sore throat, dysphagia, dental problems, itching, sneezing,  nasal congestion or excess/ purulent secretions, ear ache,   fever, chills, sweats, unintended wt loss, pleuritic or exertional cp, hemoptysis,  orthopnea pnd or leg swelling, presyncope, palpitations, heartburn, abdominal pain, anorexia, nausea, vomiting, diarrhea  or change in bowel or urinary habits, change in stools or  urine, dysuria,hematuria,  rash, arthralgias, visual complaints, headache, numbness weakness or ataxia or problems with walking or coordination,  change in mood/affect or memory.                      Objective:   Physical Exam  amb hoarse wm    04/27/2014      267 > 06/15/2014  252 >>254 07/02/2014   HEENT: nl dentition, turbinates, and orophanx. Nl external ear canals without cough reflex   NECK :  without JVD/Nodes/TM/ nl carotid upstrokes bilaterally   LUNGS: no acc muscle use, decreased bs L base  without cough on insp or exp maneuvers   CV:  RRR  no s3 or ncrease in P2, 1-2 + edema   II-III/ VI sem  ABD:  soft and nontender with nl excursion in the supine position. No bruits or organomegaly, bowel sounds nl  MS:  warm without deformities, calf tenderness, cyanosis or clubbing  SKIN: warm and dry without lesions    NEURO:  alert, approp, no deficits              06/15/14   cxr :  cardiomegaly with L effusion s change  .  Recent Labs Lab 06/12/14 1507 06/15/14 1535  NA 138 138  K 3.1* 3.8  CL 104 103  CO2 28 27  BUN 16 21  CREATININE 1.25 1.25  GLUCOSE 161* 119*    Recent Labs Lab 06/15/14 1535  HGB 12.8*  HCT 40.9  WBC 6.3  PLT 147.0*     Lab Results  Component Value Date   TSH 1.350 11/27/2013     Lab Results  Component Value Date   PROBNP 1300.0* 06/15/2014              Assessment & Plan:

## 2014-07-02 NOTE — Patient Instructions (Addendum)
Protonix 40 mg Take 30-60 min before first meal of the day and pepcid 20 mg at bedtime   GERD (REFLUX)  is an extremely common cause of respiratory symptoms just like yours , many times with no obvious heartburn at all.    It can be treated with medication, but also with lifestyle changes including avoidance of late meals, excessive alcohol, smoking cessation, and avoid fatty foods, chocolate, peppermint, colas, red wine, and acidic juices such as orange juice.  NO MINT OR MENTHOL PRODUCTS SO NO COUGH DROPS  USE SUGARLESS CANDY INSTEAD (Jolley ranchers or Stover's or Life Savers) or even ice chips will also do - the key is to swallow to prevent all throat clearing. NO OIL BASED VITAMINS - use powdered substitutes.  Take delsym two tsp every 12 hours and supplement if needed with tramadol 50 up to 2    every 4 hours to suppress the urge to cough. Swallowing water or using ice chips/non mint and menthol containing candies (such as lifesavers or sugarless jolly ranchers) are also effective.  You should rest your voice and avoid activities that you know make you cough.  Follow up Dr. Sherene Sires  In 2 months with PFT .

## 2014-07-03 ENCOUNTER — Inpatient Hospital Stay (HOSPITAL_COMMUNITY)
Admission: EM | Admit: 2014-07-03 | Discharge: 2014-07-09 | DRG: 286 | Disposition: A | Discharge status: Home / Self Care | Payer: Medicare Other | Attending: Cardiology | Admitting: Cardiology

## 2014-07-03 ENCOUNTER — Encounter: Payer: Self-pay | Admitting: Cardiology

## 2014-07-03 ENCOUNTER — Encounter (HOSPITAL_COMMUNITY): Payer: Self-pay | Admitting: *Deleted

## 2014-07-03 ENCOUNTER — Telehealth: Payer: Self-pay

## 2014-07-03 ENCOUNTER — Ambulatory Visit (INDEPENDENT_AMBULATORY_CARE_PROVIDER_SITE_OTHER): Payer: Medicare Other | Admitting: Cardiology

## 2014-07-03 ENCOUNTER — Encounter (HOSPITAL_COMMUNITY)
Admission: RE | Admit: 2014-07-03 | Discharge: 2014-07-03 | Disposition: A | Discharge status: Home / Self Care | Payer: Self-pay | Source: Ambulatory Visit | Attending: Cardiology | Admitting: Cardiology

## 2014-07-03 ENCOUNTER — Encounter (HOSPITAL_COMMUNITY): Payer: Self-pay | Admitting: Pharmacy Technician

## 2014-07-03 ENCOUNTER — Other Ambulatory Visit: Payer: Self-pay | Admitting: Cardiology

## 2014-07-03 ENCOUNTER — Telehealth: Payer: Self-pay | Admitting: Internal Medicine

## 2014-07-03 VITALS — BP 120/84 | HR 88 | Ht 67.0 in | Wt 254.0 lb

## 2014-07-03 DIAGNOSIS — I129 Hypertensive chronic kidney disease with stage 1 through stage 4 chronic kidney disease, or unspecified chronic kidney disease: Secondary | ICD-10-CM | POA: Diagnosis present

## 2014-07-03 DIAGNOSIS — I10 Essential (primary) hypertension: Secondary | ICD-10-CM

## 2014-07-03 DIAGNOSIS — I493 Ventricular premature depolarization: Secondary | ICD-10-CM | POA: Diagnosis present

## 2014-07-03 DIAGNOSIS — R040 Epistaxis: Secondary | ICD-10-CM | POA: Diagnosis not present

## 2014-07-03 DIAGNOSIS — Z6836 Body mass index (BMI) 36.0-36.9, adult: Secondary | ICD-10-CM | POA: Diagnosis not present

## 2014-07-03 DIAGNOSIS — Z881 Allergy status to other antibiotic agents status: Secondary | ICD-10-CM

## 2014-07-03 DIAGNOSIS — R0602 Shortness of breath: Secondary | ICD-10-CM | POA: Diagnosis present

## 2014-07-03 DIAGNOSIS — Z9049 Acquired absence of other specified parts of digestive tract: Secondary | ICD-10-CM | POA: Diagnosis present

## 2014-07-03 DIAGNOSIS — R001 Bradycardia, unspecified: Secondary | ICD-10-CM

## 2014-07-03 DIAGNOSIS — E785 Hyperlipidemia, unspecified: Secondary | ICD-10-CM

## 2014-07-03 DIAGNOSIS — I255 Ischemic cardiomyopathy: Secondary | ICD-10-CM | POA: Diagnosis present

## 2014-07-03 DIAGNOSIS — N183 Chronic kidney disease, stage 3 (moderate): Secondary | ICD-10-CM | POA: Diagnosis present

## 2014-07-03 DIAGNOSIS — Z9861 Coronary angioplasty status: Secondary | ICD-10-CM

## 2014-07-03 DIAGNOSIS — K219 Gastro-esophageal reflux disease without esophagitis: Secondary | ICD-10-CM | POA: Diagnosis present

## 2014-07-03 DIAGNOSIS — I480 Paroxysmal atrial fibrillation: Secondary | ICD-10-CM | POA: Diagnosis present

## 2014-07-03 DIAGNOSIS — D696 Thrombocytopenia, unspecified: Secondary | ICD-10-CM | POA: Diagnosis present

## 2014-07-03 DIAGNOSIS — D509 Iron deficiency anemia, unspecified: Secondary | ICD-10-CM | POA: Diagnosis present

## 2014-07-03 DIAGNOSIS — I5043 Acute on chronic combined systolic (congestive) and diastolic (congestive) heart failure: Secondary | ICD-10-CM | POA: Diagnosis present

## 2014-07-03 DIAGNOSIS — I5023 Acute on chronic systolic (congestive) heart failure: Secondary | ICD-10-CM

## 2014-07-03 DIAGNOSIS — I5042 Chronic combined systolic (congestive) and diastolic (congestive) heart failure: Secondary | ICD-10-CM

## 2014-07-03 DIAGNOSIS — M1990 Unspecified osteoarthritis, unspecified site: Secondary | ICD-10-CM | POA: Diagnosis present

## 2014-07-03 DIAGNOSIS — Z7901 Long term (current) use of anticoagulants: Secondary | ICD-10-CM

## 2014-07-03 DIAGNOSIS — Z87891 Personal history of nicotine dependence: Secondary | ICD-10-CM | POA: Diagnosis not present

## 2014-07-03 DIAGNOSIS — Z888 Allergy status to other drugs, medicaments and biological substances status: Secondary | ICD-10-CM | POA: Diagnosis not present

## 2014-07-03 DIAGNOSIS — I2510 Atherosclerotic heart disease of native coronary artery without angina pectoris: Secondary | ICD-10-CM

## 2014-07-03 DIAGNOSIS — G4733 Obstructive sleep apnea (adult) (pediatric): Secondary | ICD-10-CM | POA: Diagnosis present

## 2014-07-03 DIAGNOSIS — Z7982 Long term (current) use of aspirin: Secondary | ICD-10-CM

## 2014-07-03 DIAGNOSIS — E876 Hypokalemia: Secondary | ICD-10-CM | POA: Diagnosis present

## 2014-07-03 DIAGNOSIS — I350 Nonrheumatic aortic (valve) stenosis: Secondary | ICD-10-CM

## 2014-07-03 DIAGNOSIS — Z951 Presence of aortocoronary bypass graft: Secondary | ICD-10-CM

## 2014-07-03 DIAGNOSIS — Z950 Presence of cardiac pacemaker: Secondary | ICD-10-CM | POA: Diagnosis not present

## 2014-07-03 DIAGNOSIS — Z01812 Encounter for preprocedural laboratory examination: Secondary | ICD-10-CM

## 2014-07-03 HISTORY — DX: Gastrointestinal hemorrhage, unspecified: K92.2

## 2014-07-03 HISTORY — DX: Epistaxis: R04.0

## 2014-07-03 LAB — COMPREHENSIVE METABOLIC PANEL
ALT: 37 U/L (ref 0–53)
AST: 37 U/L (ref 0–37)
Albumin: 3.5 g/dL (ref 3.5–5.2)
Alkaline Phosphatase: 138 U/L — ABNORMAL HIGH (ref 39–117)
Anion gap: 13 (ref 5–15)
BUN: 25 mg/dL — ABNORMAL HIGH (ref 6–23)
CHLORIDE: 109 mmol/L (ref 96–112)
CO2: 15 mmol/L — ABNORMAL LOW (ref 19–32)
CREATININE: 1.44 mg/dL — AB (ref 0.50–1.35)
Calcium: 9.1 mg/dL (ref 8.4–10.5)
GFR calc non Af Amer: 47 mL/min — ABNORMAL LOW (ref >90)
GFR, EST AFRICAN AMERICAN: 54 mL/min — AB (ref >90)
Glucose, Bld: 129 mg/dL — ABNORMAL HIGH (ref 70–99)
Potassium: 4.8 mmol/L (ref 3.5–5.1)
SODIUM: 137 mmol/L (ref 135–145)
Total Bilirubin: 1.3 mg/dL — ABNORMAL HIGH (ref 0.3–1.2)
Total Protein: 7 g/dL (ref 6.0–8.3)

## 2014-07-03 LAB — BASIC METABOLIC PANEL
BUN: 24 mg/dL — ABNORMAL HIGH (ref 6–23)
CALCIUM: 9.4 mg/dL (ref 8.4–10.5)
CO2: 25 mEq/L (ref 19–32)
CREATININE: 1.47 mg/dL (ref 0.40–1.50)
Chloride: 108 mEq/L (ref 96–112)
GFR: 49.83 mL/min — ABNORMAL LOW (ref >60.00)
Glucose, Bld: 132 mg/dL — ABNORMAL HIGH (ref 70–99)
Potassium: 4.7 mEq/L (ref 3.5–5.1)
Sodium: 139 mEq/L (ref 135–145)

## 2014-07-03 LAB — CBC WITH DIFFERENTIAL/PLATELET
BASOS ABS: 0 10*3/uL (ref 0.0–0.1)
Basophils Absolute: 0.1 10*3/uL (ref 0.0–0.1)
Basophils Relative: 0.4 % (ref 0.0–3.0)
Basophils Relative: 1 % (ref 0–1)
EOS ABS: 0 10*3/uL (ref 0.0–0.7)
EOS PCT: 1 % (ref 0–5)
Eosinophils Absolute: 0 10*3/uL (ref 0.0–0.7)
Eosinophils Relative: 0.4 % (ref 0.0–5.0)
HCT: 39.8 % (ref 39.0–52.0)
HCT: 40.1 % (ref 39.0–52.0)
HEMOGLOBIN: 12.8 g/dL — AB (ref 13.0–17.0)
Hemoglobin: 12.8 g/dL — ABNORMAL LOW (ref 13.0–17.0)
LYMPHS ABS: 1 10*3/uL (ref 0.7–4.0)
LYMPHS PCT: 13.4 % (ref 12.0–46.0)
Lymphocytes Relative: 18 % (ref 12–46)
Lymphs Abs: 1 10*3/uL (ref 0.7–4.0)
MCH: 25.5 pg — AB (ref 26.0–34.0)
MCHC: 32.2 g/dL (ref 30.0–36.0)
MCHC: 31.9 g/dL (ref 30.0–36.0)
MCV: 78.4 fl (ref 78.0–100.0)
MCV: 80 fL (ref 78.0–100.0)
MONO ABS: 0.5 10*3/uL (ref 0.1–1.0)
MONOS PCT: 7.1 % (ref 3.0–12.0)
Monocytes Absolute: 0.5 10*3/uL (ref 0.1–1.0)
Monocytes Relative: 9 % (ref 3–12)
NEUTROS ABS: 3.9 10*3/uL (ref 1.7–7.7)
Neutro Abs: 5.9 10*3/uL (ref 1.4–7.7)
Neutrophils Relative %: 78.7 % — ABNORMAL HIGH (ref 43.0–77.0)
Neutrophils Relative %: 71 % (ref 43–77)
Platelets: 167 10*3/uL (ref 150.0–400.0)
Platelets: 174 10*3/uL (ref 150–400)
RBC: 5.07 Mil/uL (ref 4.22–5.81)
RBC: 5.01 MIL/uL (ref 4.22–5.81)
RDW: 22.1 % — AB (ref 11.5–15.5)
RDW: 21 % — ABNORMAL HIGH (ref 11.5–15.5)
WBC: 7.4 10*3/uL (ref 4.0–10.5)
WBC: 5.5 10*3/uL (ref 4.0–10.5)

## 2014-07-03 LAB — BRAIN NATRIURETIC PEPTIDE: PRO B NATRI PEPTIDE: 1409 pg/mL — AB (ref 0.0–100.0)

## 2014-07-03 LAB — MAGNESIUM: Magnesium: 2 mg/dL (ref 1.5–2.5)

## 2014-07-03 LAB — APTT
APTT: 41 s — AB (ref 24–37)
aPTT: 37.8 s — ABNORMAL HIGH (ref 23.4–32.7)

## 2014-07-03 LAB — PROTIME-INR
INR: 2.5 ratio — AB (ref 0.8–1.0)
INR: 2.28 — AB (ref 0.00–1.49)
PROTHROMBIN TIME: 27.2 s — AB (ref 9.6–13.1)
Prothrombin Time: 25.3 seconds — ABNORMAL HIGH (ref 11.6–15.2)

## 2014-07-03 LAB — TSH: TSH: 0.91 u[IU]/mL (ref 0.35–4.50)

## 2014-07-03 MED ORDER — ISOSORBIDE MONONITRATE ER 30 MG PO TB24
30.0000 mg | ORAL_TABLET | Freq: Every day | ORAL | Status: DC
  Administered 2014-07-04 – 2014-07-09 (×6): 30 mg via ORAL
  Filled 2014-07-03 (×6): qty 1

## 2014-07-03 MED ORDER — SODIUM CHLORIDE 0.9 % IJ SOLN
3.0000 mL | Freq: Two times a day (BID) | INTRAMUSCULAR | Status: DC
  Administered 2014-07-03 – 2014-07-09 (×7): 3 mL via INTRAVENOUS

## 2014-07-03 MED ORDER — FUROSEMIDE 10 MG/ML IJ SOLN: 80.0000 mg | Freq: Two times a day (BID) | INTRAMUSCULAR | Status: DC

## 2014-07-03 MED ORDER — ONDANSETRON HCL 4 MG/2ML IJ SOLN: 4.0000 mg | Freq: Four times a day (QID) | INTRAMUSCULAR | Status: DC | PRN

## 2014-07-03 MED ORDER — SODIUM CHLORIDE 0.9 % IJ SOLN
3.0000 mL | INTRAMUSCULAR | Status: DC | PRN
  Administered 2014-07-09: 3 mL via INTRAVENOUS
  Filled 2014-07-03: qty 3

## 2014-07-03 MED ORDER — WARFARIN SODIUM 5 MG PO TABS: 5.0000 mg | ORAL_TABLET | Freq: Every day | ORAL | Status: DC

## 2014-07-03 MED ORDER — CARVEDILOL 6.25 MG PO TABS
6.2500 mg | ORAL_TABLET | Freq: Two times a day (BID) | ORAL | Status: DC
  Administered 2014-07-04: 6.25 mg via ORAL
  Filled 2014-07-03 (×3): qty 1

## 2014-07-03 MED ORDER — HYDRALAZINE HCL 10 MG PO TABS
10.0000 mg | ORAL_TABLET | Freq: Three times a day (TID) | ORAL | Status: DC
  Administered 2014-07-03 – 2014-07-07 (×10): 10 mg via ORAL
  Filled 2014-07-03 (×14): qty 1

## 2014-07-03 MED ORDER — TRAMADOL HCL 50 MG PO TABS: ORAL_TABLET | ORAL | Status: DC

## 2014-07-03 MED ORDER — TRAMADOL HCL 50 MG PO TABS
50.0000 mg | ORAL_TABLET | Freq: Four times a day (QID) | ORAL | Status: DC | PRN
  Administered 2014-07-04 – 2014-07-06 (×3): 50 mg via ORAL
  Filled 2014-07-03 (×3): qty 1

## 2014-07-03 MED ORDER — DEXTROMETHORPHAN POLISTIREX 30 MG/5ML PO LQCR
30.0000 mg | Freq: Every day | ORAL | Status: DC
  Administered 2014-07-03 – 2014-07-08 (×6): 30 mg via ORAL
  Filled 2014-07-03 (×8): qty 5

## 2014-07-03 MED ORDER — FUROSEMIDE 10 MG/ML IJ SOLN
80.0000 mg | Freq: Two times a day (BID) | INTRAMUSCULAR | Status: DC
  Administered 2014-07-03 – 2014-07-05 (×5): 80 mg via INTRAVENOUS
  Filled 2014-07-03 (×8): qty 8

## 2014-07-03 MED ORDER — SODIUM CHLORIDE 0.9 % IV SOLN: 250.0000 mL | INTRAVENOUS | Status: DC | PRN

## 2014-07-03 NOTE — Patient Instructions (Signed)
Your physician recommends that you have lab work TODAY.  Your physician has requested that you have a cardiac catheterization. Cardiac catheterization is used to diagnose and/or treat various heart conditions. Doctors may recommend this procedure for a number of different reasons. The most common reason is to evaluate chest pain. Chest pain can be a symptom of coronary artery disease (CAD), and cardiac catheterization can show whether plaque is narrowing or blocking your heart's arteries. This procedure is also used to evaluate the valves, as well as measure the blood flow and oxygen levels in different parts of your heart. For further information please visit [email or web address] Please follow instruction sheet, as given.  Your physician recommends that you schedule a follow-up appointment AS NEEDED pending the results of your cath.

## 2014-07-03 NOTE — Progress Notes (Signed)
Cardiology Office Note   Date:  07/03/2014   ID:  Brandon Klein, DOB 06/09/1940, MRN [id number]  PCP:  Gaye Alken, MD  Cardiologist:   [name], MD   Chief Complaint  Patient presents with  . Sleep Apnea  . Coronary Artery Disease  . Cardiomyopathy  . Congestive Heart Failure      History of Present Illness: Brandon Klein is a 74 y.o. male with a hx of CAD, status post prior stenting to the LAD and balloon angioplasty to the circumflex, paroxysmal atrial fibrillation, bradycardia s/p PPM, aortic stenosis, HTN, HL. He was recently seen with exertional chest discomfort and set up for stress testing. This was high risk and he was set up for cardiac catheterization. This demonstrated 3 vessel CAD and he was referred for CABG. Patient was admitted 6/30-7/10. He underwent CABG with Dr. Donata Clay (LIMA-LAD, SVG-diagonal, SVG-circumflex, SVG-PDA). He developed postoperative atrial fibrillation and was placed on amiodarone. He has normal LVF by echo 10/2013. Patient had volume overload and was sent home on Lasix. His weight came down nicely. However, after stopping this, his weight started to increase again. He was given another week of Lasix and then stopped it. He started to have fluid buildup again and is now on chronic Lasix. Since I saw him last he was set up for sleep study which showed severe OSA and underwent CPAP titration but could not be adequately titrated and was set up for BIPAP titration.  He recently had some issues with GI bleeding and underwent endoscopy by Dr. 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 He was subsequently referred to Dr. Josetta Huddle at [facility] for duodenal bulb polypectomy and underwent this procedure uneventfully and lesion was benign.  He was referred to Dr. Sherene Sires last November for SOB and cough associated with wheezing, scratchy nose and throat and watery eyes.  Dr. Sherene Sires  felt that he had GERD causing his cough but was concerned about O2 desats to 87% on RA with ambulation and he ordered a BNP as well as a 2D echo which showed significantly reduced LVF with EF 20%.  He Lopressor was stopped and he was started on Coreg/Hydralazine and Imdur and presents back today for followup.  He comes in today with his wife who has a lot of questions as to why is EF has now declined.  He still is SOB and wheezing some as well as having persistent LE edema.  His Lasix was initially increased but then decreased due to worsening renal insuff.  He denies any PND or orthopnea but if he walks any significant distance he has DOE.  He denies any anginal chest pain.     Past Medical History  Diagnosis Date  . Syncope     a. vasovagal with documented bradycardia.  . Pacemaker     a. 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 b. MDT generator replacement 2012.  . Obesity   . HTN (hypertension)   . HLD (hyperlipidemia)   . DJD (degenerative joint disease)   . GERD (gastroesophageal reflux disease)   . Impaired fasting glucose     A1C check every year  . PAF (paroxysmal atrial fibrillation)     on chronic systemic anticoagulation  . Coronary artery disease     a. s/p PCI of LAD 1997. b. Cutting balloon angioplasty to LCx in 2002. c. s/p cath 11/2013 with severe 3  vessel ASCAD s/p CABG with LIMA to LAD, SVG to diag, SVG to left circ and SVG to PDA.  Marland Kitchen Postoperative atrial fibrillation   . AS (aortic stenosis)     mild by echo 10/2013  . Hypokalemia   . Hypomagnesemia   . Carotid artery plaque     a. Duplex 10/2013: mild calcific plaque origin ICA. Left: intimal wall thickening CCA. 0-39% BICA.  Marland Kitchen PVC's (premature ventricular contractions)   . OSA (obstructive sleep apnea)   . Chronic diastolic CHF (congestive heart failure)   . Shortness of breath dyspnea     Past Surgical History  Procedure Laterality Date  . Cholecystectomy    . Knee arthroscopy  2000     Left  . Angioplasty  1997  . Pacemaker insertion  2002  . Coronary artery bypass graft N/A 12/01/2013    Procedure: CORONARY ARTERY BYPASS GRAFT TIMES FOUR USING LEFT INTERNAL MAMMARY ARTERY TO LAD, SAPHENOUS VEIN GRAFTS TO DIAGONAL, CIRCUMFELX AND POSTERIOR DESCENDING;  Surgeon: Kerin Perna, MD;  Location: [facility] OR;  Service: Open Heart Surgery;  Laterality: N/A;  . Esophagogastroduodenoscopy (egd) with propofol N/A 05/05/2014    Procedure: ESOPHAGOGASTRODUODENOSCOPY (EGD) WITH PROPOFOL;  Surgeon: Charolett Bumpers, MD;  Location: [facility] END[facility]COPY;  Service: Endoscopy;  Laterality: N/A;  . Colonoscopy with propofol N/A 05/05/2014    Procedure: COLONOSCOPY WITH PROPOFOL;  Surgeon: Charolett Bumpers, MD;  Location: [facility];  Service: Endoscopy;  Laterality: N/A;  . Left heart catheterization with coronary angiogram N/A 11/25/2013    Procedure: LEFT HEART CATHETERIZATION WITH CORONARY ANGIOGRAM;  Surgeon: [name], MD;  Location: [facility] CATH LAB;  Service: Cardiovascular;  Laterality: N/A;     Current Outpatient Prescriptions  Medication Sig Dispense Refill  . aspirin EC 81 MG EC tablet Take 1 tablet (81 mg total) by mouth daily. (Patient taking differently: Take 81 mg by mouth every morning. )    . atorvastatin (LIPITOR) 20 MG tablet Take 20 mg by mouth daily at 6 PM.     . carvedilol (COREG) 3.125 MG tablet Take 1 tablet (3.125 mg total) by mouth 2 (two) times daily. 60 tablet 3  . Cholecalciferol (VITAMIN D) 2000 UNITS tablet Take 2,000 Units by mouth daily.    . Cyanocobalamin (VITAMIN B-12 PO) Take 1 tablet by mouth every morning.    . docusate sodium (COLACE) 100 MG capsule Take 200 mg by mouth daily as needed for mild constipation.    . ferrous sulfate 325 (65 FE) MG tablet Take 325 mg by mouth daily with breakfast.     . fexofenadine (ALLEGRA) 180 MG tablet Take 180 mg by mouth every morning.     . fluticasone (FLONASE) 50 MCG/ACT nasal spray Place 2 sprays into the nose every morning.       . furosemide (LASIX) 40 MG tablet Take 1 tablet (40 mg total) by mouth 2 (two) times daily. As directed 60 tablet 6  . Glucosamine-Chondroit-Vit C-Mn (GLUCOSAMINE CHONDR 1500 COMPLX) CAPS Take 1 capsule by mouth every morning.     . hydrALAZINE (APRESOLINE) 10 MG tablet Take 1 tablet (10 mg total) by mouth 3 (three) times daily. 90 tablet 3  . isosorbide mononitrate (IMDUR) 30 MG 24 hr tablet Take 1 tablet (30 mg total) by mouth daily. 30 tablet 3  . L-Lysine 500 MG CAPS Take 1 capsule by mouth every morning.     . Magnesium Oxide 250 MG TABS Take 2 tablets (500 mg total) by mouth 2 (  two) times daily. 120 tablet 6  . meclizine (ANTIVERT) 25 MG tablet Take 25 mg by mouth daily as needed for dizziness.     . montelukast (SINGULAIR) 10 MG tablet Take 1 tablet (10 mg total) by mouth at bedtime. 30 tablet 1  . Multiple Vitamin (MULTIVITAMIN) tablet Take 1 tablet by mouth every morning.     . nitroGLYCERIN (NITROSTAT) 0.4 MG SL tablet Place 1 tablet (0.4 mg total) under the tongue every 5 (five) minutes as needed for chest pain. 30 tablet 3  . ONE TOUCH ULTRA TEST test strip 1 each by Other route every morning.     Letta Pate DELICA LANCETS FINE MISC 1 each by Other route every morning.     . pantoprazole (PROTONIX) 40 MG tablet Take 40 mg by mouth every morning.     Marland Kitchen Phenylephrine-DM-GG-APAP (DELSYM COUGH/COLD DAYTIME PO) Take 10 mLs by mouth 2 (two) times daily as needed (cold).     . potassium chloride SA (K-DUR,KLOR-CON) 20 MEQ tablet Take 2 tablets (40 mEq total) by mouth 3 (three) times daily. 120 tablet 2  . traMADol (ULTRAM) 50 MG tablet 1-2 every 4 hours as needed for cough or pain 40 tablet 0  . warfarin (COUMADIN) 5 MG tablet Take as directed by coumadin clinic 120 tablet 0   No current facility-administered medications for this visit.    Allergies:   Acetaminophen-codeine; Amoxicillin; and Diovan    Social History:  The patient  reports that he quit smoking about 48 years ago. His  smoking use included Cigarettes. He has a 10 pack-year smoking history. He has never used smokeless tobacco. He reports that he drinks alcohol. He reports that he does not use illicit drugs.   Family History:  The patient's family history includes Colon cancer in his mother; Heart attack in his brother and mother; Leukemia in his father.    ROS:  Please see the history of present illness.   Otherwise, review of systems are positive for none.   All other systems are reviewed and negative.    PHYSICAL EXAM: VS:  BP 120/84 mmHg  Pulse 88  Ht  (1.702 m)  Wt 254 lb (115.214 kg)  BMI 39.77 kg/m2  SpO2 94% , BMI Body mass index is 39.77 kg/(m^2). GEN: Well nourished, well developed, in no acute distress HEENT: normal Neck: no JVD, carotid bruits, or masses Cardiac: RRR; no murmurs, rubs, or gallops,no edema  Respiratory:  clear to auscultation bilaterally, normal work of breathing GI: soft, nontender, nondistended, + BS MS: no deformity or atrophy Skin: warm and dry, no rash Neuro:  Strength and sensation are intact Psych: euthymic mood, full affect   EKG:  EKG  ordered today.  NSR at 84 bpm with LAD with nonspecific ST abnormaltiy    Recent Labs: 11/27/2013: TSH 1.350 04/02/2014: ALT 48 04/17/2014: Magnesium 1.9 06/15/2014: Hemoglobin 12.8*; Platelets 147.0*; Pro B Natriuretic peptide (BNP) 1300.0* 06/18/2014: BUN 34*; Creatinine 1.64*; Potassium 4.0; Sodium 137    Lipid Panel    Component Value Date/Time   CHOL 116 07/14/2013 0937   TRIG 134 01/15/2014 1021   TRIG 201.0* 07/14/2013 0937   HDL 36.10* 07/14/2013 0937   CHOLHDL 3 07/14/2013 0937   VLDL 40.2* 07/14/2013 0937   LDLCALC 42 01/15/2014 1021   LDLDIRECT 47.9 07/14/2013 0937      Wt Readings from Last 3 Encounters:  07/03/14 254 lb (115.214 kg)  07/02/14 254 lb 2 oz (115.27 kg)  06/15/14 252 lb (  114.306 kg)        ASSESSMENT AND PLAN:  1. Shortness of breath - I suspect that his dyspnea is  multifactorial from underlying GERD, CHF with worsening LVF and anemia. His lungs are clear on exam today but he has persistent LE edema.  His last Hbg was 12.8.   2. Chronic combined systolic/diastolic CHF class II: He appears volume overloaded on exam with increased LE edema and increased abdominal girth although lungs are clear - I will continue him on Lasix for diuresis and check a BNP and BMET since his renal function had worsened after increasing Lasix recently.  Based on these lab results his diuretic will be adjusted.  Continue BB/Hydralazine/nitrates.  Not on ACE I due to renal insuff.  Increase Coreg to 6.25mg  BID.  The etiology of his worsening LVF since CABG is unclear and could be related to early graft failure vs. Viral DCM ( he has not had any illness recently) or idiopathic.  He is maintaining NSR so the unlikely to be tachycardia mediated.  If renal function is stable I will add Aldactone.  I have recommended right and left heart catheterization to determined if he has had graft failure.  He will stop his Coumadin today for cath next Tuesday.   3. CAD s/p CABG 12/02/13:Continue aspirin, statin, beta blocker.  4. PAF (paroxysmal atrial fibrillation): Maintaining NSR. Continue Coumadin/BB.  5. Mild to moderate aortic stenosis by recent echo - Mean gradient was and prior to CABG was .  AS may be underestimated now due to low EF.  Will assess at heart cath.  6. Essential hypertension: controlled. Continue BB/Hydralazine 7. Dyslipidemia: LDL at goal at 42. Continue statin.  8. Bradycardia s/p MDT pacemaker: Follow up with the EP as planned.  9. OSA s/p BiPAP titration to 20/16cm H2O    Current medicines are reviewed at length with the patient today.  The patient does not have concerns regarding medicines.  The following changes have been made:  no change  Labs/ tests ordered today include: BMET/BNP/CBC, right and left heart cath  I have spent 45 minutes in  direct patient care the patient.  >50% of this time was spent interviewing patient, examining patient and lengthy discussion regarding heart failure and treatment plan.  Disposition:   FU with me after cath   SignedQuintella Reichert, MD  07/03/2014 11:35 AM    [facility] [address] [place], [place], [place]  [address] Phone: [phone]; Fax: [phone]

## 2014-07-03 NOTE — Telephone Encounter (Signed)
Rx has been called in. Apologized to pt for any convenience. Nothing further was needed.

## 2014-07-03 NOTE — Telephone Encounter (Signed)
[employer] placard information dropped in mail for patient.

## 2014-07-03 NOTE — ED Notes (Signed)
Bypass surgery July.  He was in cardiac rehab earlier today.  Labs and ekg  Done in office just pta here

## 2014-07-03 NOTE — Addendum Note (Signed)
Addended by: Armanda Magic R on: 07/03/2014 01:56 PM   Modules accepted: Kipp Brood

## 2014-07-03 NOTE — ED Provider Notes (Signed)
Patient was not seen by the emergency room. He was directly admitted by Carolanne Grumbling and when he arrived there were no admission orders present. When he was brought back to the emergency department room cardiology saw him immediately.  Gwyneth Sprout, MD 07/03/14 2019

## 2014-07-03 NOTE — Addendum Note (Signed)
Addended by: [name] on: 07/03/2014 05:37 PM   Modules accepted: Kipp Brood

## 2014-07-03 NOTE — ED Notes (Signed)
Pt sent from dr turners office  From earlier today .  He was called  And told dr turner wanted him admitted because his heart was not functioning at full capacity no pain anywhere

## 2014-07-03 NOTE — Telephone Encounter (Signed)
Patient informed of results and verbal understanding expressed.  Cardmaster Trish aware patient is on his way to the ED.

## 2014-07-03 NOTE — ED Notes (Signed)
Admitting Md at bedside

## 2014-07-03 NOTE — H&P (Unsigned)
Expand All Collapse All      Cardiology Office Note   Date: 07/03/2014   ID: Brandon Klein, DOB [birth year], MRN [id number]  PCP: Gaye Alken, MD Cardiologist: [name], MD   Chief Complaint  Patient presents with  . Sleep Apnea  . Coronary Artery Disease  . Cardiomyopathy  . Congestive Heart Failure     History of Present Illness: Brandon Klein is a 74 y.o. male with a hx of CAD, status post prior stenting to the LAD and balloon angioplasty to the circumflex, paroxysmal atrial fibrillation, bradycardia s/p PPM, aortic stenosis, HTN, HL. He was recently seen with exertional chest discomfort and set up for stress testing. This was high risk and he was set up for cardiac catheterization. This demonstrated 3 vessel CAD and he was referred for CABG. Patient was admitted 6/30-7/10. He underwent CABG with Dr. Donata Clay (LIMA-LAD, SVG-diagonal, SVG-circumflex, SVG-PDA). He developed postoperative atrial fibrillation and was placed on amiodarone. He has normal LVF by echo 10/2013. Patient had volume overload and was sent home on Lasix. His weight came down nicely. However, after stopping this, his weight started to increase again. He was given another week of Lasix and then stopped it. He started to have fluid buildup again and is now on chronic Lasix. Since I saw him last he was set up for sleep study which showed severe OSA and underwent CPAP titration but could not be adequately titrated and was set up for BIPAP titration.  He recently had some issues with GI bleeding and underwent endoscopy by Dr. 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 He was subsequently referred to Dr. Josetta Huddle at [facility] for duodenal bulb polypectomy and underwent this procedure uneventfully and lesion was benign. He was referred to Dr. Sherene Sires last November for SOB and cough associated with wheezing,  scratchy nose and throat and watery eyes. Dr. Sherene Sires felt that he had GERD causing his cough but was concerned about O2 desats to 87% on RA with ambulation and he ordered a BNP as well as a 2D echo which showed significantly reduced LVF with EF 20%. He Lopressor was stopped and he was started on Coreg/Hydralazine and Imdur and presents back today for followup. He comes in today with his wife who has a lot of questions as to why is EF has now declined. He still is SOB and wheezing some as well as having persistent LE edema. His Lasix was initially increased but then decreased due to worsening renal insuff. He denies any PND or orthopnea but if he walks any significant distance he has DOE. He denies any anginal chest pain.    Past Medical History  Diagnosis Date  . Syncope     a. vasovagal with documented bradycardia.  . Pacemaker     a. 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 b. MDT generator replacement 2012.  . Obesity   . HTN (hypertension)   . HLD (hyperlipidemia)   . DJD (degenerative joint disease)   . GERD (gastroesophageal reflux disease)   . Impaired fasting glucose     A1C check every year  . PAF (paroxysmal atrial fibrillation)     on chronic systemic anticoagulation  . Coronary artery disease     a. s/p PCI of LAD 1997. b. Cutting balloon angioplasty to LCx in 2002. c. s/p cath 11/2013 with severe 3 vessel ASCAD s/p CABG with LIMA to  LAD, SVG to diag, SVG to left circ and SVG to PDA.  Marland Kitchen Postoperative atrial fibrillation   . AS (aortic stenosis)     mild by echo 10/2013  . Hypokalemia   . Hypomagnesemia   . Carotid artery plaque     a. Duplex 10/2013: mild calcific plaque origin ICA. Left: intimal wall thickening CCA. 0-39% BICA.  Marland Kitchen PVC's (premature ventricular contractions)   . OSA (obstructive sleep apnea)   . Chronic diastolic CHF (congestive heart failure)   .  Shortness of breath dyspnea     Past Surgical History  Procedure Laterality Date  . Cholecystectomy    . Knee arthroscopy  2000    Left  . Angioplasty  1997  . Pacemaker insertion  2002  . Coronary artery bypass graft N/A 12/01/2013    Procedure: CORONARY ARTERY BYPASS GRAFT TIMES FOUR USING LEFT INTERNAL MAMMARY ARTERY TO LAD, SAPHENOUS VEIN GRAFTS TO DIAGONAL, CIRCUMFELX AND POSTERIOR DESCENDING; Surgeon: Kerin Perna, MD; Location: [facility] OR; Service: Open Heart Surgery; Laterality: N/A;  . Esophagogastroduodenoscopy (egd) with propofol N/A 05/05/2014    Procedure: ESOPHAGOGASTRODUODENOSCOPY (EGD) WITH PROPOFOL; Surgeon: Charolett Bumpers, MD; Location: [facility] ENDOSCOPY; Service: Endoscopy; Laterality: N/A;  . Colonoscopy with propofol N/A 05/05/2014    Procedure: COLONOSCOPY WITH PROPOFOL; Surgeon: Charolett Bumpers, MD; Location: [facility] ENDOSCOPY; Service: Endoscopy; Laterality: N/A;  . Left heart catheterization with coronary angiogram N/A 11/25/2013    Procedure: LEFT HEART CATHETERIZATION WITH CORONARY ANGIOGRAM; Surgeon: [name], MD; Location: [facility] CATH LAB; Service: Cardiovascular; Laterality: N/A;     Current Outpatient Prescriptions  Medication Sig Dispense Refill  . aspirin EC 81 MG EC tablet Take 1 tablet (81 mg total) by mouth daily. (Patient taking differently: Take 81 mg by mouth every morning. )    . atorvastatin (LIPITOR) 20 MG tablet Take 20 mg by mouth daily at 6 PM.     . carvedilol (COREG) 3.125 MG tablet Take 1 tablet (3.125 mg total) by mouth 2 (two) times daily. 60 tablet 3  . Cholecalciferol (VITAMIN D) 2000 UNITS tablet Take 2,000 Units by mouth daily.    . Cyanocobalamin (VITAMIN B-12 PO) Take 1 tablet by mouth every morning.    . docusate sodium (COLACE) 100 MG capsule Take 200 mg by mouth daily as needed for mild constipation.    . ferrous sulfate 325 (65 FE)  MG tablet Take 325 mg by mouth daily with breakfast.     . fexofenadine (ALLEGRA) 180 MG tablet Take 180 mg by mouth every morning.     . fluticasone (FLONASE) 50 MCG/ACT nasal spray Place 2 sprays into the nose every morning.     . furosemide (LASIX) 40 MG tablet Take 1 tablet (40 mg total) by mouth 2 (two) times daily. As directed 60 tablet 6  . Glucosamine-Chondroit-Vit C-Mn (GLUCOSAMINE CHONDR 1500 COMPLX) CAPS Take 1 capsule by mouth every morning.     . hydrALAZINE (APRESOLINE) 10 MG tablet Take 1 tablet (10 mg total) by mouth 3 (three) times daily. 90 tablet 3  . isosorbide mononitrate (IMDUR) 30 MG 24 hr tablet Take 1 tablet (30 mg total) by mouth daily. 30 tablet 3  . L-Lysine 500 MG CAPS Take 1 capsule by mouth every morning.     . Magnesium Oxide 250 MG TABS Take 2 tablets (500 mg total) by mouth 2 (two) times daily. 120 tablet 6  . meclizine (ANTIVERT) 25 MG tablet Take 25 mg by mouth daily as needed for dizziness.     Marland Kitchen  montelukast (SINGULAIR) 10 MG tablet Take 1 tablet (10 mg total) by mouth at bedtime. 30 tablet 1  . Multiple Vitamin (MULTIVITAMIN) tablet Take 1 tablet by mouth every morning.     . nitroGLYCERIN (NITROSTAT) 0.4 MG SL tablet Place 1 tablet (0.4 mg total) under the tongue every 5 (five) minutes as needed for chest pain. 30 tablet 3  . ONE TOUCH ULTRA TEST test strip 1 each by Other route every morning.     Letta Pate DELICA LANCETS FINE MISC 1 each by Other route every morning.     . pantoprazole (PROTONIX) 40 MG tablet Take 40 mg by mouth every morning.     Marland Kitchen Phenylephrine-DM-GG-APAP (DELSYM COUGH/COLD DAYTIME PO) Take 10 mLs by mouth 2 (two) times daily as needed (cold).     . potassium chloride SA (K-DUR,KLOR-CON) 20 MEQ tablet Take 2 tablets (40 mEq total) by mouth 3 (three) times daily. 120 tablet 2  . traMADol (ULTRAM) 50 MG tablet 1-2 every 4 hours as needed for cough or  pain 40 tablet 0  . warfarin (COUMADIN) 5 MG tablet Take as directed by coumadin clinic 120 tablet 0   No current facility-administered medications for this visit.    Allergies: Acetaminophen-codeine; Amoxicillin; and Diovan    Social History: The patient  reports that he quit smoking about 48 years ago. His smoking use included Cigarettes. He has a 10 pack-year smoking history. He has never used smokeless tobacco. He reports that he drinks alcohol. He reports that he does not use illicit drugs.   Family History: The patient's family history includes Colon cancer in his mother; Heart attack in his brother and mother; Leukemia in his father.    ROS: Please see the history of present illness. Otherwise, review of systems are positive for none. All other systems are reviewed and negative.    PHYSICAL EXAM: VS: BP 120/84 mmHg  Pulse 88  Ht  (1.702 m)  Wt 254 lb (115.214 kg)  BMI 39.77 kg/m2  SpO2 94% , BMI Body mass index is 39.77 kg/(m^2). GEN: Well nourished, well developed, in no acute distress  HEENT: normal  Neck: no JVD, carotid bruits, or masses Cardiac: RRR; no murmurs, rubs, or gallops,no edema  Respiratory: clear to auscultation bilaterally, normal work of breathing GI: soft, nontender, nondistended, + BS MS: no deformity or atrophy  Skin: warm and dry, no rash Neuro: Strength and sensation are intact Psych: euthymic mood, full affect   EKG: EKG ordered today. NSR at 84 bpm with LAD with nonspecific ST abnormaltiy    Recent Labs: 11/27/2013: TSH 1.350 04/02/2014: ALT 48 04/17/2014: Magnesium 1.9 06/15/2014: Hemoglobin 12.8*; Platelets 147.0*; Pro B Natriuretic peptide (BNP) 1300.0* 06/18/2014: BUN 34*; Creatinine 1.64*; Potassium 4.0; Sodium 137    Lipid Panel  Labs (Brief)       Component Value Date/Time   CHOL 116 07/14/2013 0937   TRIG 134 01/15/2014 1021   TRIG 201.0* 07/14/2013 0937   HDL 36.10* 07/14/2013  0937   CHOLHDL 3 07/14/2013 0937   VLDL 40.2* 07/14/2013 0937   LDLCALC 42 01/15/2014 1021   LDLDIRECT 47.9 07/14/2013 0937       Wt Readings from Last 3 Encounters:  07/03/14 254 lb (115.214 kg)  07/02/14 254 lb 2 oz (115.27 kg)  06/15/14 252 lb (114.306 kg)        ASSESSMENT AND PLAN:  1. Shortness of breath - I suspect that his dyspnea is multifactorial from underlying GERD, CHF with worsening LVF  and anemia. His lungs are clear on exam today but he has persistent LE edema. His last Hbg was 12.8.  2. Chronic combined systolic/diastolic CHF class II: He appears volume overloaded on exam with increased LE edema and increased abdominal girth although lungs are clear - I will continue him on Lasix for diuresis and check a BNP and BMET since his renal function had worsened after increasing Lasix recently. Based on these lab results his diuretic will be adjusted. Continue BB/Hydralazine/nitrates. Not on ACE I due to renal insuff. Increase Coreg to 6.25mg  BID. The etiology of his worsening LVF since CABG is unclear and could be related to early graft failure vs. Viral DCM ( he has not had any illness recently) or idiopathic. He is maintaining NSR so the unlikely to be tachycardia mediated. If renal function is stable I will add Aldactone. I have recommended right and left heart catheterization to determined if he has had graft failure. He will stop his Coumadin today for cath next Tuesday.  3. CAD s/p CABG 12/02/13:Continue aspirin, statin, beta blocker.  4. PAF (paroxysmal atrial fibrillation): Maintaining NSR. Continue Coumadin/BB.  5. Mild to moderate aortic stenosis by recent echo - Mean gradient was and prior to CABG was . AS may be underestimated now due to low EF. Will assess at heart cath.  6. Essential hypertension: controlled. Continue BB/Hydralazine 7. Dyslipidemia: LDL at goal at 42. Continue statin.  8.  Bradycardia s/p MDT pacemaker: Follow up with the EP as planned.  9. OSA s/p BiPAP titration to 20/16cm H2O    Current medicines are reviewed at length with the patient today. The patient does not have concerns regarding medicines.  The following changes have been made: no change  Labs/ tests ordered today include: BMET/BNP/CBC, right and left heart cath  I have spent 45 minutes in direct patient care the patient. >50% of this time was spent interviewing patient, examining patient and lengthy discussion regarding heart failure and treatment plan.  Disposition: FU with me after cath   SignedQuintella Reichert, MD  07/03/2014 11:35 AM  [facility] [address] [place], [place], [place] [address] Phone: [phone]; Fax: [phone]   Addendum:  Labs reviewed from today and BNP continues to trend upward now at 1400.  We have had problems with diuresis on PO lasix due to worsening renal function.  Renal function now improved on lower dose of Lasix but now with worsening CHF.  Will admit to [facility] tele unit for IV diuresis.  I will add Aldactone 12.5mg  daily and follow renal function.  Coreg increased at OV today.  Will hold coumadin in preparation for right and left heart cath on Monday or Tuesday pending trend in INR.  He had normal LVF at time of CABG in June 2015 and now has EF 20% of ? Etiology.  Also recent echo showed worsening of AS from mild to now mild to moderate but AV gradient may be underestimated due to reduced EF.   Signed: Armanda Magic, MD [facility] 07/03/2014

## 2014-07-03 NOTE — Telephone Encounter (Signed)
-----   Message from [name], MD sent at 07/03/2014  4:38 PM EST ----- BNP continues to trend upward with worsening SOB and increased LE edema.  We have had problems managing his diuresis with PO lasix due to worsening renal function and now his CHF Is worsening by symptoms and BNP.  I have recommended to Brandon Klein that we admit him to hospital over the weekend for IV diuresis.  We will hold his coumadin for right and left heart cath early next week pending how quickly his INR normalizes.  Patient agrees to go to the ER now to be admitted.

## 2014-07-04 ENCOUNTER — Inpatient Hospital Stay (HOSPITAL_COMMUNITY): Payer: Medicare Other

## 2014-07-04 DIAGNOSIS — E876 Hypokalemia: Secondary | ICD-10-CM

## 2014-07-04 DIAGNOSIS — I480 Paroxysmal atrial fibrillation: Secondary | ICD-10-CM

## 2014-07-04 DIAGNOSIS — I10 Essential (primary) hypertension: Secondary | ICD-10-CM

## 2014-07-04 DIAGNOSIS — E785 Hyperlipidemia, unspecified: Secondary | ICD-10-CM

## 2014-07-04 DIAGNOSIS — I2510 Atherosclerotic heart disease of native coronary artery without angina pectoris: Principal | ICD-10-CM

## 2014-07-04 DIAGNOSIS — I5023 Acute on chronic systolic (congestive) heart failure: Secondary | ICD-10-CM

## 2014-07-04 DIAGNOSIS — R0602 Shortness of breath: Secondary | ICD-10-CM | POA: Insufficient documentation

## 2014-07-04 DIAGNOSIS — Z7901 Long term (current) use of anticoagulants: Secondary | ICD-10-CM

## 2014-07-04 DIAGNOSIS — Z9861 Coronary angioplasty status: Secondary | ICD-10-CM

## 2014-07-04 DIAGNOSIS — I350 Nonrheumatic aortic (valve) stenosis: Secondary | ICD-10-CM

## 2014-07-04 LAB — BASIC METABOLIC PANEL
Anion gap: 9 (ref 5–15)
BUN: 23 mg/dL (ref 6–23)
CALCIUM: 8.8 mg/dL (ref 8.4–10.5)
CO2: 26 mmol/L (ref 19–32)
Chloride: 106 mmol/L (ref 96–112)
Creatinine, Ser: 1.36 mg/dL — ABNORMAL HIGH (ref 0.50–1.35)
GFR calc Af Amer: 58 mL/min — ABNORMAL LOW (ref >90)
GFR calc non Af Amer: 50 mL/min — ABNORMAL LOW (ref >90)
GLUCOSE: 105 mg/dL — AB (ref 70–99)
Potassium: 3.5 mmol/L (ref 3.5–5.1)
Sodium: 141 mmol/L (ref 135–145)

## 2014-07-04 LAB — PROTIME-INR
INR: 1.93 — ABNORMAL HIGH (ref 0.00–1.49)
Prothrombin Time: 22.2 seconds — ABNORMAL HIGH (ref 11.6–15.2)

## 2014-07-04 LAB — TSH: TSH: 0.941 u[IU]/mL (ref 0.350–4.500)

## 2014-07-04 MED ORDER — POTASSIUM CHLORIDE CRYS ER 20 MEQ PO TBCR
40.0000 meq | EXTENDED_RELEASE_TABLET | Freq: Every day | ORAL | Status: DC
  Administered 2014-07-04 – 2014-07-05 (×2): 40 meq via ORAL
  Filled 2014-07-04 (×2): qty 2

## 2014-07-04 MED ORDER — CARVEDILOL 12.5 MG PO TABS
12.5000 mg | ORAL_TABLET | Freq: Two times a day (BID) | ORAL | Status: DC
  Administered 2014-07-04 – 2014-07-08 (×8): 12.5 mg via ORAL
  Filled 2014-07-04 (×10): qty 1

## 2014-07-04 MED ORDER — HEPARIN (PORCINE) IN NACL 100-0.45 UNIT/ML-% IJ SOLN
1550.0000 [IU]/h | INTRAMUSCULAR | Status: DC
  Administered 2014-07-04: 1250 [IU]/h via INTRAVENOUS
  Filled 2014-07-04 (×3): qty 250

## 2014-07-04 NOTE — Progress Notes (Addendum)
SUBJECTIVE:  Breathing improved and less abdominal tightness.  Still with marked LE edema  OBJECTIVE:   Vitals:   Filed Vitals:   07/03/14 2117 07/04/14 0208 07/04/14 0635 07/04/14 1046  BP:  143/88 124/92 123/73  Pulse:  82 76 77  Temp:  98 F (36.7 C) 97.5 F (36.4 C)   TempSrc:  Oral Oral   Resp:  16 16 16   Height: 5\' 7"  (1.702 m)     Weight: 251 lb 3.2 oz (113.944 kg)  241 lb 11.2 oz (109.634 kg)   SpO2:  98% 96% 97%   I&O's:   Intake/Output Summary (Last 24 hours) at 07/04/14 1239 Last data filed at 07/04/14 1100  Gross per 24 hour  Intake    240 ml  Output   4100 ml  Net  -3860 ml   TELEMETRY: Reviewed telemetry pt in NSR:     PHYSICAL EXAM General: Well developed, well nourished, in no acute distress Head: Eyes PERRLA, No xanthomas.   Normal cephalic and atramatic  Lungs:  Scattered wheezes Heart:   HRRR S1 S2 Pulses are 2+ & equal. Abdomen: Bowel sounds are positive, abdomen soft and non-tender without masses Extremities:   Marked 3+ LE edema bilaterally  Neuro: Alert and oriented X 3. Psych:  Good affect, responds appropriately   LABS: Basic Metabolic Panel:  Recent Labs  58/85/02 2242 07/04/14 0355  NA 137 141  K 4.8 3.5  CL 109 106  CO2 15* 26  GLUCOSE 129* 105*  BUN 25* 23  CREATININE 1.44* 1.36*  CALCIUM 9.1 8.8  MG 2.0  --    Liver Function Tests:  Recent Labs  07/03/14 2242  AST 37  ALT 37  ALKPHOS 138*  BILITOT 1.3*  PROT 7.0  ALBUMIN 3.5   No results for input(s): LIPASE, AMYLASE in the last 72 hours. CBC:  Recent Labs  07/03/14 1251 07/03/14 2242  WBC 7.4 5.5  NEUTROABS 5.9 3.9  HGB 12.8* 12.8*  HCT 39.8 40.1  MCV 78.4 80.0  PLT 167.0 174   Cardiac Enzymes: No results for input(s): CKTOTAL, CKMB, CKMBINDEX, TROPONINI in the last 72 hours. BNP: Invalid input(s): POCBNP D-Dimer: No results for input(s): DDIMER in the last 72 hours. Hemoglobin A1C: No results for input(s): HGBA1C in the last 72  hours. Fasting Lipid Panel: No results for input(s): CHOL, HDL, LDLCALC, TRIG, CHOLHDL, LDLDIRECT in the last 72 hours. Thyroid Function Tests:  Recent Labs  07/03/14 2242  TSH 0.941   Anemia Panel: No results for input(s): VITAMINB12, FOLATE, FERRITIN, TIBC, IRON, RETICCTPCT in the last 72 hours. Coag Panel:   Lab Results  Component Value Date   INR 2.28* 07/03/2014   INR 2.5* 07/03/2014   INR 2.0 06/17/2014    RADIOLOGY: Dg Chest 2 View  07/04/2014   CLINICAL DATA:  Initial evaluation for shortness of breath and fluid retention  EXAM: CHEST  2 VIEW  COMPARISON:  06/15/2014  FINDINGS: Moderate cardiac silhouette enlargement. Vascular pattern normal. Stable cardiac pacer. Right lung is clear. On the left, there is stable mild blunting of the costophrenic angle. The left lung is otherwise clear.  IMPRESSION: No acute findings. Stable mild left costophrenic angle blunting and stable moderate cardiac enlargement.   Electronically Signed   By: Esperanza Heir M.D.   On: 07/04/2014 08:59   Dg Chest 2 View  06/16/2014   CLINICAL DATA:  Cough.  Shortness of breath.  EXAM: CHEST  2 VIEW  COMPARISON:  CT 04/02/2014.  Chest x-ray 04/02/2014.  FINDINGS: Severe cardiomegaly. Prior CABG. Cardiac pacer and lead tips in right atrium and right ventricle. Bilateral pulmonary alveolar infiltrates with small left pleural effusion. These findings consistent congestive heart failure. No pneumothorax.  IMPRESSION: 1. Cardiomegaly. Prior CABG. Cardiac pacer and lead tips in right atrium and right ventricle. 2. Congestive heart failure with bilateral pleural interstitial edema and small left pleural effusion.   Electronically Signed   By: Maisie Fus  Register   On: 06/16/2014 08:13   ASSESSMENT AND PLAN:  1. Shortness of breath - I suspect that his dyspnea is multifactorial from underlying GERD, CHF with worsening LVF and anemia.  Breathing improved with diuresis.   2. Chronic combined systolic/diastolic CHF class  IIII - primarily right sided: He appears volume overloaded on exam with increased LE edema and increased abdominal girth although lungs are clear.  He has diuresed 3.8L since admission and weight is down 10 lbs overnight. Renal function stable and creatinine improved with diuresis.   Continue BB/Hydralazine/nitrates.Increase Coreg to 12.5mg  BID.   Not on ACE I due to renal insuff. The etiology of his worsening LVF since CABG is unclear and could be related to early graft failure vs. Viral DCM ( he has not had any illness recently) or idiopathic. TSH is normal.  He is maintaining NSR so the unlikely to be tachycardia mediated. If renal function remains stable will add Aldactone at discharge. I have recommended right and left heart catheterization to determined if he has had graft failure. Coumadin on hold for cath.  3. CAD s/p CABG 12/02/13:Continue aspirin, statin, beta blocker.  4. PAF (paroxysmal atrial fibrillation): Maintaining NSR. Continue Coumadin/BB.  5. Mild to moderate aortic stenosis by recent echo - Mean gradient was and prior to CABG was . AS may be underestimated now due to low EF. Will assess at heart cath.  6. Essential hypertension: controlled. Continue BB/Hydralazine 7. Dyslipidemia: LDL at goal at 42. Continue statin.  8. Bradycardia s/p MDT pacemaker: Follow up with the EP as planned.  9. OSA s/p BiPAP titration to 20/16cm H2O  10.  Hypokalemia - replete 11.  Acute on Chronic renal insufficiency stage III - creatinine improved with diuresis.    [name], MD  07/04/2014  12:39 PM

## 2014-07-04 NOTE — Progress Notes (Addendum)
ANTICOAGULATION CONSULT NOTE - Initial Consult  Pharmacy Consult for heparin Indication: atrial fibrillation  Allergies  Allergen Reactions  . Acetaminophen-Codeine Other (See Comments)    Extreme constipation   . Amoxicillin Itching and Rash    Unknown-maybe a rash?  . Diovan [Valsartan] Other (See Comments)    Unknown     Patient Measurements: Height: 5\' 7"  (170.2 cm) Weight: 241 lb 11.2 oz (109.634 kg) IBW/kg (Calculated) : 66.1 Heparin Dosing Weight: 90kg  Vital Signs: Temp: 97.5 F (36.4 C) (02/06 0635) Temp Source: Oral (02/06 0635) BP: 123/73 mmHg (02/06 1046) Pulse Rate: 77 (02/06 1046)  Labs:  Recent Labs  07/03/14 1251 07/03/14 2242 07/04/14 0355  HGB 12.8* 12.8*  --   HCT 39.8 40.1  --   PLT 167.0 174  --   APTT 37.8* 41*  --   LABPROT 27.2* 25.3*  --   INR 2.5* 2.28*  --   CREATININE 1.47 1.44* 1.36*    Estimated Creatinine Clearance: 57.1 mL/min (by C-G formula based on Cr of 1.36).   Medical History: Past Medical History  Diagnosis Date  . Syncope     a. vasovagal with documented bradycardia.  . Pacemaker     a. 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 b. MDT generator replacement 2012.  . Obesity   . HTN (hypertension)   . HLD (hyperlipidemia)   . DJD (degenerative joint disease)   . GERD (gastroesophageal reflux disease)   . Impaired fasting glucose     A1C check every year  . PAF (paroxysmal atrial fibrillation)     on chronic systemic anticoagulation  . Coronary artery disease     a. s/p PCI of LAD 1997. b. Cutting balloon angioplasty to LCx in 2002. c. s/p cath 11/2013 with severe 3 vessel ASCAD s/p CABG with LIMA to LAD, SVG to diag, SVG to left circ and SVG to PDA.  Marland Kitchen Postoperative atrial fibrillation   . AS (aortic stenosis)     mild by echo 10/2013  . Hypokalemia   . Hypomagnesemia   . Carotid artery plaque     a. Duplex 10/2013: mild calcific plaque origin ICA. Left: intimal wall thickening CCA. 0-39%  BICA.  Marland Kitchen PVC's (premature ventricular contractions)   . OSA (obstructive sleep apnea)   . Chronic diastolic CHF (congestive heart failure)   . Shortness of breath dyspnea     Medications:  Scheduled:  . carvedilol  12.5 mg Oral BID WC  . dextromethorphan  30 mg Oral QHS  . furosemide  80 mg Intravenous BID  . hydrALAZINE  10 mg Oral TID  . isosorbide mononitrate  30 mg Oral Daily  . potassium chloride  40 mEq Oral Daily  . sodium chloride  3 mL Intravenous Q12H   Infusions:    Assessment: 74 yo who was admitted for SOB due to multifactorial issues. He has been on coumadin for PAF. INR yesterday was 2.28. Plan is to bridge with heparin while coumadin is on hold. Going to check one today and will start heparin if<2. Plan is to cath.   Goal of Therapy:  Heparin level 0.3-0.7 units/ml Monitor platelets by anticoagulation protocol: Yes   Plan:   INR now Start heparin if <2  Ulyses Southward, PharmD Pager: [phone] 07/04/2014 2:11 PM    Addum>  INR 1.93.  Start heparin drip no bolus at 1250 units/hr Check heparin level in 6 hours  Talbert Cage, PharmD

## 2014-07-05 LAB — CBC
HEMATOCRIT: 36.8 % — AB (ref 39.0–52.0)
Hemoglobin: 11.8 g/dL — ABNORMAL LOW (ref 13.0–17.0)
MCH: 25.3 pg — ABNORMAL LOW (ref 26.0–34.0)
MCHC: 32.1 g/dL (ref 30.0–36.0)
MCV: 78.8 fL (ref 78.0–100.0)
Platelets: ADEQUATE 10*3/uL (ref 150–400)
RBC: 4.67 MIL/uL (ref 4.22–5.81)
RDW: 20.4 % — ABNORMAL HIGH (ref 11.5–15.5)
WBC: 5.2 10*3/uL (ref 4.0–10.5)

## 2014-07-05 LAB — BASIC METABOLIC PANEL
Anion gap: 8 (ref 5–15)
BUN: 20 mg/dL (ref 6–23)
CO2: 29 mmol/L (ref 19–32)
Calcium: 8.4 mg/dL (ref 8.4–10.5)
Chloride: 102 mmol/L (ref 96–112)
Creatinine, Ser: 1.1 mg/dL (ref 0.50–1.35)
GFR calc Af Amer: 75 mL/min — ABNORMAL LOW (ref >90)
GFR, EST NON AFRICAN AMERICAN: 65 mL/min — AB (ref >90)
Glucose, Bld: 84 mg/dL (ref 70–99)
Potassium: 2.9 mmol/L — ABNORMAL LOW (ref 3.5–5.1)
Sodium: 139 mmol/L (ref 135–145)

## 2014-07-05 LAB — HEPARIN LEVEL (UNFRACTIONATED)
HEPARIN UNFRACTIONATED: 0.3 [IU]/mL (ref 0.30–0.70)
Heparin Unfractionated: <0.1 IU/mL — ABNORMAL LOW (ref 0.30–0.70)

## 2014-07-05 LAB — PLATELET INHIBITION P2Y12: PLATELET FUNCTION P2Y12: 183 [PRU] — AB (ref 194–418)

## 2014-07-05 LAB — PROTIME-INR
INR: 1.76 — AB (ref 0.00–1.49)
Prothrombin Time: 20.7 seconds — ABNORMAL HIGH (ref 11.6–15.2)

## 2014-07-05 MED ORDER — SODIUM CHLORIDE 0.9 % IV SOLN
INTRAVENOUS | Status: DC
  Administered 2014-07-06: 06:00:00 via INTRAVENOUS

## 2014-07-05 MED ORDER — SODIUM CHLORIDE 0.9 % IV SOLN: 250.0000 mL | INTRAVENOUS | Status: DC | PRN

## 2014-07-05 MED ORDER — ASPIRIN EC 81 MG PO TBEC
81.0000 mg | DELAYED_RELEASE_TABLET | Freq: Every day | ORAL | Status: DC
  Administered 2014-07-05 – 2014-07-06 (×2): 81 mg via ORAL
  Filled 2014-07-05 (×2): qty 1

## 2014-07-05 MED ORDER — HEPARIN (PORCINE) IN NACL 100-0.45 UNIT/ML-% IJ SOLN
1700.0000 [IU]/h | INTRAMUSCULAR | Status: DC
  Administered 2014-07-05: 1700 [IU]/h via INTRAVENOUS
  Filled 2014-07-05: qty 250

## 2014-07-05 MED ORDER — DOCUSATE SODIUM 100 MG PO CAPS
100.0000 mg | ORAL_CAPSULE | Freq: Every day | ORAL | Status: DC
  Administered 2014-07-05 – 2014-07-09 (×4): 100 mg via ORAL
  Filled 2014-07-05 (×5): qty 1

## 2014-07-05 MED ORDER — POTASSIUM CHLORIDE CRYS ER 20 MEQ PO TBCR
40.0000 meq | EXTENDED_RELEASE_TABLET | Freq: Two times a day (BID) | ORAL | Status: DC
  Administered 2014-07-05 – 2014-07-09 (×8): 40 meq via ORAL
  Filled 2014-07-05 (×10): qty 2

## 2014-07-05 MED ORDER — SODIUM CHLORIDE 0.9 % IJ SOLN: 3.0000 mL | INTRAMUSCULAR | Status: DC | PRN

## 2014-07-05 MED ORDER — ASPIRIN 81 MG PO CHEW
81.0000 mg | CHEWABLE_TABLET | ORAL | Status: AC
Start: 2014-07-06 — End: 2014-07-06
  Administered 2014-07-06: 81 mg via ORAL
  Filled 2014-07-05: qty 1

## 2014-07-05 MED ORDER — POTASSIUM CHLORIDE CRYS ER 20 MEQ PO TBCR
40.0000 meq | EXTENDED_RELEASE_TABLET | Freq: Once | ORAL | Status: AC
  Administered 2014-07-05: 40 meq via ORAL
  Filled 2014-07-05: qty 2

## 2014-07-05 MED ORDER — SODIUM CHLORIDE 0.9 % IJ SOLN
3.0000 mL | Freq: Two times a day (BID) | INTRAMUSCULAR | Status: DC
Start: 2014-07-05 — End: 2014-07-06
  Administered 2014-07-05 – 2014-07-06 (×2): 3 mL via INTRAVENOUS

## 2014-07-05 NOTE — Progress Notes (Signed)
Pt's potassium is 2.9, MD on call notified, awaiting callback at this time.

## 2014-07-05 NOTE — Progress Notes (Addendum)
ANTICOAGULATION CONSULT NOTE - Follow Up Consult  Pharmacy Consult for Heparin  Indication: atrial fibrillation  Allergies  Allergen Reactions  . Acetaminophen-Codeine Other (See Comments)    Extreme constipation   . Amoxicillin Itching and Rash    Unknown-maybe a rash?  . Diovan [Valsartan] Other (See Comments)    Unknown     Patient Measurements: Height: 5\' 7"  (170.2 cm) Weight: 241 lb 11.2 oz (109.634 kg) IBW/kg (Calculated) : 66.1  Vital Signs: Temp: 97.5 F (36.4 C) (02/06 2014) Temp Source: Oral (02/06 2014) BP: 103/62 mmHg (02/06 2014) Pulse Rate: 73 (02/06 2014)  Labs:  Recent Labs  07/03/14 1251 07/03/14 2242 07/04/14 0355 07/04/14 1518 07/04/14 2302  HGB 12.8* 12.8*  --   --   --   HCT 39.8 40.1  --   --   --   PLT 167.0 174  --   --   --   APTT 37.8* 41*  --   --   --   LABPROT 27.2* 25.3*  --  22.2*  --   INR 2.5* 2.28*  --  1.93*  --   HEPARINUNFRC  --   --   --   --  <0.10*  CREATININE 1.47 1.44* 1.36*  --   --     Estimated Creatinine Clearance: 57.1 mL/min (by C-G formula based on Cr of 1.36).   Assessment: Sub-therapeutic heparin level x 1, no issues per RN.   Goal of Therapy:  Heparin level 0.3-0.7 units/ml Monitor platelets by anticoagulation protocol: Yes   Plan:   -Increase heparin drip to 1550 units/hr -0900 HL -Daily CBC/HL -Monitor for bleeding  Loren, Aprahamian 07/05/2014,1:07 AM  Addendum  Heparin level is therapeutic now on the low side. Will adjust rate.\  Plan  Increase heparin to 1700 units/hr F/u with 6 hr level to confirm  Ulyses Southward, PharmD Pager: [phone] 07/05/2014 10:44 AM

## 2014-07-05 NOTE — Consult Note (Signed)
Reason for Consult: Right epistaxis Referring Physician: Fransico Him, MD  HPI:  Brandon Klein is an 74 y.o. male who was admitted for treatment of his CHF/CAD. He is currently anticoagulated with Coumadin. ENT is consulted to evaluate his recent epistaxis. According to the patient, he had an episode of severe right epistaxis one week ago. He also had a minor bleeding episode this morning. His other epistaxis episodes were more than 20 years ago. He has no previous nasal surgery. All his epistaxis was self limiting. Currently he denies any facial pain, nasal obstruction, rhinorrhea, or postnasal drainage.  Past Medical History  Diagnosis Date  . Syncope     a. vasovagal with documented bradycardia.  . Pacemaker     a. 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 b. MDT generator replacement 2012.  . Obesity   . HTN (hypertension)   . HLD (hyperlipidemia)   . DJD (degenerative joint disease)   . GERD (gastroesophageal reflux disease)   . Impaired fasting glucose     A1C check every year  . PAF (paroxysmal atrial fibrillation)     on chronic systemic anticoagulation  . Coronary artery disease     a. s/p PCI of LAD 1997. b. Cutting balloon angioplasty to LCx in 2002. c. s/p cath 11/2013 with severe 3 vessel ASCAD s/p CABG with LIMA to LAD, SVG to diag, SVG to left circ and SVG to PDA.  Marland Kitchen Postoperative atrial fibrillation   . AS (aortic stenosis)     mild by echo 10/2013  . Hypokalemia   . Hypomagnesemia   . Carotid artery plaque     a. Duplex 10/2013: mild calcific plaque origin ICA. Left: intimal wall thickening CCA. 0-39% BICA.  Marland Kitchen PVC's (premature ventricular contractions)   . OSA (obstructive sleep apnea)   . Chronic diastolic CHF (congestive heart failure)   . Shortness of breath dyspnea     Past Surgical History  Procedure Laterality Date  . Cholecystectomy    . Knee arthroscopy  2000    Left  . Angioplasty  1997  . Pacemaker insertion  2002  . Coronary  artery bypass graft N/A 12/01/2013    Procedure: CORONARY ARTERY BYPASS GRAFT TIMES FOUR USING LEFT INTERNAL MAMMARY ARTERY TO LAD, SAPHENOUS VEIN GRAFTS TO DIAGONAL, CIRCUMFELX AND POSTERIOR DESCENDING;  Surgeon: Ivin Poot, MD;  Location: [facility];  Service: Open Heart Surgery;  Laterality: N/A;  . Esophagogastroduodenoscopy (egd) with propofol N/A 05/05/2014    Procedure: ESOPHAGOGASTRODUODENOSCOPY (EGD) WITH PROPOFOL;  Surgeon: Garlan Fair, MD;  Location: [facility];  Service: Endoscopy;  Laterality: N/A;  . Colonoscopy with propofol N/A 05/05/2014    Procedure: COLONOSCOPY WITH PROPOFOL;  Surgeon: Garlan Fair, MD;  Location: [facility];  Service: Endoscopy;  Laterality: N/A;  . Left heart catheterization with coronary angiogram N/A 11/25/2013    Procedure: LEFT HEART CATHETERIZATION WITH CORONARY ANGIOGRAM;  Surgeon: Sueanne Margarita, MD;  Location: [facility];  Service: Cardiovascular;  Laterality: N/A;    Family History  Problem Relation Age of Onset  . Leukemia Father     deceased  . Colon cancer Mother     deceased  . Heart attack Mother   . Heart attack Brother     deceased    Social History:  reports that he quit smoking about 48 years ago. His smoking use included Cigarettes. He has a 10 pack-year smoking history. He has never used smokeless tobacco. He reports that he drinks alcohol.  He reports that he does not use illicit drugs.  Allergies:  Allergies  Allergen Reactions  . Acetaminophen-Codeine Other (See Comments)    Extreme constipation   . Amoxicillin Itching and Rash    Unknown-maybe a rash?  . Diovan [Valsartan] Other (See Comments)    Unknown     Prior to Admission medications   Medication Sig Start Date End Date Taking? Authorizing Provider  aspirin EC 81 MG EC tablet Take 1 tablet (81 mg total) by mouth daily. Patient taking differently: Take 81 mg by mouth every morning.  12/07/13  Yes Erin Barrett, PA-C  atorvastatin (LIPITOR) 20 MG tablet Take  20 mg by mouth daily at 6 PM.    Yes Historical Provider, MD  carvedilol (COREG) 6.25 MG tablet Take 6.25 mg by mouth 2 (two) times daily with a meal.   Yes Historical Provider, MD  Cholecalciferol (VITAMIN D) 2000 UNITS tablet Take 2,000 Units by mouth daily.   Yes Historical Provider, MD  Cyanocobalamin (VITAMIN B-12 PO) Take 1 tablet by mouth every morning.   Yes Historical Provider, MD  docusate sodium (COLACE) 100 MG capsule Take 200 mg by mouth every morning.    Yes Historical Provider, MD  ferrous sulfate 325 (65 FE) MG tablet Take 325 mg by mouth daily with breakfast.    Yes Historical Provider, MD  fexofenadine (ALLEGRA) 180 MG tablet Take 180 mg by mouth every morning.    Yes Historical Provider, MD  fluticasone (FLONASE) 50 MCG/ACT nasal spray Place 2 sprays into the nose every morning.    Yes Historical Provider, MD  furosemide (LASIX) 40 MG tablet Take 1 tablet (40 mg total) by mouth 2 (two) times daily. As directed 04/17/14  Yes Dayna N Dunn, PA-C  Glucosamine-Chondroit-Vit C-Mn (GLUCOSAMINE CHONDR 1500 COMPLX) CAPS Take 1 capsule by mouth every morning.    Yes Historical Provider, MD  hydrALAZINE (APRESOLINE) 10 MG tablet Take 1 tablet (10 mg total) by mouth 3 (three) times daily. 06/22/14  Yes Sueanne Margarita, MD  isosorbide mononitrate (IMDUR) 30 MG 24 hr tablet Take 1 tablet (30 mg total) by mouth daily. 06/22/14  Yes Sueanne Margarita, MD  L-Lysine 500 MG CAPS Take 1 capsule by mouth every morning.    Yes Historical Provider, MD  Magnesium Oxide 250 MG TABS Take 2 tablets (500 mg total) by mouth 2 (two) times daily. 04/09/14  Yes Sueanne Margarita, MD  meclizine (ANTIVERT) 25 MG tablet Take 25 mg by mouth daily as needed for dizziness.    Yes Historical Provider, MD  montelukast (SINGULAIR) 10 MG tablet Take 1 tablet (10 mg total) by mouth at bedtime. 06/16/14  Yes Tanda Rockers, MD  Multiple Vitamin (MULTIVITAMIN) tablet Take 1 tablet by mouth every morning.    Yes Historical Provider, MD   nitroGLYCERIN (NITROSTAT) 0.4 MG SL tablet Place 1 tablet (0.4 mg total) under the tongue every 5 (five) minutes as needed for chest pain. 01/28/14  Yes Sueanne Margarita, MD  ONE TOUCH ULTRA TEST test strip 1 each by Other route every morning.  01/12/14  Yes Historical Provider, MD  [place] 1 each by Other route every morning.  01/12/14  Yes Historical Provider, MD  pantoprazole (PROTONIX) 40 MG tablet Take 40 mg by mouth every morning.    Yes Historical Provider, MD  Phenylephrine-DM-GG-APAP (DELSYM COUGH/COLD DAYTIME PO) Take 10 mLs by mouth 2 (two) times daily as needed (cold).    Yes Historical Provider, MD  potassium chloride SA (K-DUR,KLOR-CON) 20 MEQ tablet Take 2 tablets (40 mEq total) by mouth 3 (three) times daily. 06/12/14  Yes Sueanne Margarita, MD  traMADol (ULTRAM) 50 MG tablet 1-2 every 4 hours as needed for cough or pain 07/03/14  Yes Tammy S Parrett, NP  warfarin (COUMADIN) 5 MG tablet Take as directed by coumadin clinic Patient taking differently: Take 5 mg by mouth See admin instructions. Take as directed by coumadin clinic 06/17/14  Yes Sueanne Margarita, MD    Medications:  I have reviewed the patient's current medications. Scheduled: . aspirin EC  81 mg Oral Daily  . carvedilol  12.5 mg Oral BID WC  . dextromethorphan  30 mg Oral QHS  . docusate sodium  100 mg Oral Daily  . furosemide  80 mg Intravenous BID  . hydrALAZINE  10 mg Oral TID  . isosorbide mononitrate  30 mg Oral Daily  . potassium chloride  40 mEq Oral BID  . sodium chloride  3 mL Intravenous Q12H    Results for orders placed or performed during the hospital encounter of 07/03/14 (from the past 48 hour(s))  APTT     Status: Abnormal   Collection Time: 07/03/14 10:42 PM  Result Value Ref Range   aPTT 41 (H) 24 - 37 seconds    Comment:        IF BASELINE aPTT IS ELEVATED, SUGGEST PATIENT RISK ASSESSMENT BE USED TO DETERMINE APPROPRIATE ANTICOAGULANT THERAPY.   CBC WITH DIFFERENTIAL      Status: Abnormal   Collection Time: 07/03/14 10:42 PM  Result Value Ref Range   WBC 5.5 4.0 - 10.5 K/uL   RBC 5.01 4.22 - 5.81 MIL/uL   Hemoglobin 12.8 (L) 13.0 - 17.0 g/dL   HCT 40.1 39.0 - 52.0 %   MCV 80.0 78.0 - 100.0 fL   MCH 25.5 (L) 26.0 - 34.0 pg   MCHC 31.9 30.0 - 36.0 g/dL   RDW 21.0 (H) 11.5 - 15.5 %   Platelets 174 150 - 400 K/uL   Neutrophils Relative % 71 43 - 77 %   Neutro Abs 3.9 1.7 - 7.7 K/uL   Lymphocytes Relative 18 12 - 46 %   Lymphs Abs 1.0 0.7 - 4.0 K/uL   Monocytes Relative 9 3 - 12 %   Monocytes Absolute 0.5 0.1 - 1.0 K/uL   Eosinophils Relative 1 0 - 5 %   Eosinophils Absolute 0.0 0.0 - 0.7 K/uL   Basophils Relative 1 0 - 1 %   Basophils Absolute 0.1 0.0 - 0.1 K/uL  Comprehensive metabolic panel     Status: Abnormal   Collection Time: 07/03/14 10:42 PM  Result Value Ref Range   Sodium 137 135 - 145 mmol/L   Potassium 4.8 3.5 - 5.1 mmol/L   Chloride 109 96 - 112 mmol/L   CO2 15 (L) 19 - 32 mmol/L   Glucose, Bld 129 (H) 70 - 99 mg/dL   BUN 25 (H) 6 - 23 mg/dL   Creatinine, Ser 1.44 (H) 0.50 - 1.35 mg/dL   Calcium 9.1 8.4 - 10.5 mg/dL   Total Protein 7.0 6.0 - 8.3 g/dL   Albumin 3.5 3.5 - 5.2 g/dL   AST 37 0 - 37 U/L   ALT 37 0 - 53 U/L   Alkaline Phosphatase 138 (H) 39 - 117 U/L   Total Bilirubin 1.3 (H) 0.3 - 1.2 mg/dL   GFR calc non Af Amer 47 (L) >90 mL/min   GFR  calc Af Amer 54 (L) >90 mL/min    Comment: (NOTE) The eGFR has been calculated using the CKD EPI equation. This calculation has not been validated in all clinical situations. eGFR's persistently <90 mL/min signify possible Chronic Kidney Disease.    Anion gap 13 5 - 15  Magnesium     Status: None   Collection Time: 07/03/14 10:42 PM  Result Value Ref Range   Magnesium 2.0 1.5 - 2.5 mg/dL  Protime-INR     Status: Abnormal   Collection Time: 07/03/14 10:42 PM  Result Value Ref Range   Prothrombin Time 25.3 (H) 11.6 - 15.2 seconds   INR 2.28 (H) 0.00 - 1.49  TSH     Status:  None   Collection Time: 07/03/14 10:42 PM  Result Value Ref Range   TSH 0.941 0.350 - 4.500 uIU/mL  Basic metabolic panel     Status: Abnormal   Collection Time: 07/04/14  3:55 AM  Result Value Ref Range   Sodium 141 135 - 145 mmol/L   Potassium 3.5 3.5 - 5.1 mmol/L    Comment: DELTA CHECK NOTED   Chloride 106 96 - 112 mmol/L   CO2 26 19 - 32 mmol/L   Glucose, Bld 105 (H) 70 - 99 mg/dL   BUN 23 6 - 23 mg/dL   Creatinine, Ser 1.36 (H) 0.50 - 1.35 mg/dL   Calcium 8.8 8.4 - 10.5 mg/dL   GFR calc non Af Amer 50 (L) >90 mL/min   GFR calc Af Amer 58 (L) >90 mL/min    Comment: (NOTE) The eGFR has been calculated using the CKD EPI equation. This calculation has not been validated in all clinical situations. eGFR's persistently <90 mL/min signify possible Chronic Kidney Disease.    Anion gap 9 5 - 15  Protime-INR     Status: Abnormal   Collection Time: 07/04/14  3:18 PM  Result Value Ref Range   Prothrombin Time 22.2 (H) 11.6 - 15.2 seconds   INR 1.93 (H) 0.00 - 1.49  Heparin level (unfractionated)     Status: Abnormal   Collection Time: 07/04/14 11:02 PM  Result Value Ref Range   Heparin Unfractionated <0.10 (L) 0.30 - 0.70 IU/mL    Comment:        IF HEPARIN RESULTS ARE BELOW EXPECTED VALUES, AND PATIENT DOSAGE HAS BEEN CONFIRMED, SUGGEST FOLLOW UP TESTING OF ANTITHROMBIN III LEVELS.   Basic metabolic panel     Status: Abnormal   Collection Time: 07/05/14  5:00 AM  Result Value Ref Range   Sodium 139 135 - 145 mmol/L   Potassium 2.9 (L) 3.5 - 5.1 mmol/L    Comment: DELTA CHECK NOTED   Chloride 102 96 - 112 mmol/L   CO2 29 19 - 32 mmol/L   Glucose, Bld 84 70 - 99 mg/dL   BUN 20 6 - 23 mg/dL   Creatinine, Ser 1.10 0.50 - 1.35 mg/dL   Calcium 8.4 8.4 - 10.5 mg/dL   GFR calc non Af Amer 65 (L) >90 mL/min   GFR calc Af Amer 75 (L) >90 mL/min    Comment: (NOTE) The eGFR has been calculated using the CKD EPI equation. This calculation has not been validated in all clinical  situations. eGFR's persistently <90 mL/min signify possible Chronic Kidney Disease.    Anion gap 8 5 - 15  Protime-INR     Status: Abnormal   Collection Time: 07/05/14  5:00 AM  Result Value Ref Range   Prothrombin Time 20.7 (H)  11.6 - 15.2 seconds   INR 1.76 (H) 0.00 - 1.49  CBC     Status: Abnormal   Collection Time: 07/05/14  5:00 AM  Result Value Ref Range   WBC 5.2 4.0 - 10.5 K/uL   RBC 4.67 4.22 - 5.81 MIL/uL   Hemoglobin 11.8 (L) 13.0 - 17.0 g/dL   HCT 36.8 (L) 39.0 - 52.0 %   MCV 78.8 78.0 - 100.0 fL   MCH 25.3 (L) 26.0 - 34.0 pg   MCHC 32.1 30.0 - 36.0 g/dL   RDW 20.4 (H) 11.5 - 15.5 %   Platelets  150 - 400 K/uL    PLATELET CLUMPS NOTED ON SMEAR, COUNT APPEARS ADEQUATE  Heparin level (unfractionated)     Status: None   Collection Time: 07/05/14  9:36 AM  Result Value Ref Range   Heparin Unfractionated 0.30 0.30 - 0.70 IU/mL    Comment:        IF HEPARIN RESULTS ARE BELOW EXPECTED VALUES, AND PATIENT DOSAGE HAS BEEN CONFIRMED, SUGGEST FOLLOW UP TESTING OF ANTITHROMBIN III LEVELS.     Dg Chest 2 View  07/04/2014   CLINICAL DATA:  Initial evaluation for shortness of breath and fluid retention  EXAM: CHEST  2 VIEW  COMPARISON:  06/15/2014  FINDINGS: Moderate cardiac silhouette enlargement. Vascular pattern normal. Stable cardiac pacer. Right lung is clear. On the left, there is stable mild blunting of the costophrenic angle. The left lung is otherwise clear.  IMPRESSION: No acute findings. Stable mild left costophrenic angle blunting and stable moderate cardiac enlargement.   Electronically Signed   By: Skipper Cliche M.D.   On: 07/04/2014 08:59   Review of systems: As noted above, otherwise negative.  Blood pressure 108/57, pulse 78, temperature 97.8 F (36.6 C), temperature source Oral, resp. rate 18, height _0  (1.702 m), weight 235 lb 1.6 oz (106.641 kg), SpO2 98 %.  Physical exam: GEN: Well nourished, well developed, in no acute distress  HEENT: His pupils  are equal, round, reactive to light. Extraocular motion is intact. Examination of the ears shows normal auricles and external auditory canals bilaterally. Both tympanic membranes are intact. Nasal examination shows normal mucosa, septum, turbinates. A small scab is noted over the right anterior nasal septum.  No active bleeding is noted.  Facial examination shows no asymmetry. Palpation of the face elicit no significant tenderness. Oral cavity examination shows no mucosal lacerations. No significant trismus is noted. Palpation of the neck reveals no lymphadenopathy or mass. The trachea is midline. The thyroid is not significantly enlarged. Cranial nerves 2-12 are all grossly in tact. Neck: no JVD, carotid bruits, or masses Skin: warm and dry, no rash Neuro: Strength and sensation are intact Psych: euthymic mood, full affect  Assessment/Plan: The patient's recent epistaxis has resolved. No active bleeding is noted at this time. No acute intervention is recommended at this time. If he has more bleeding with further anticoagulation, a nasal packing will be placed.  Nadirah Socorro,SUI W 07/05/2014, 5:34 PM

## 2014-07-05 NOTE — Progress Notes (Signed)
Pt has epistaxis; MD paged and notified. Dr. [name] ordered to stop pt heparin. Pt heparin stopped and saline locked. Will continue to monitor pt quietly. Arabella Merles Kaysan Peixoto RN.

## 2014-07-05 NOTE — Progress Notes (Addendum)
SUBJECTIVE:  Feels much better today but having a bloody nose  OBJECTIVE:   Vitals:   Filed Vitals:   07/04/14 1500 07/04/14 2014 07/05/14 0541 07/05/14 0919  BP: 104/78 103/62 116/93 114/79  Pulse: 76 73 71 74  Temp: 97.1 F (36.2 C) 97.5 F (36.4 C) 98 F (36.7 C) 97.8 F (36.6 C)  TempSrc: Oral Oral Oral Oral  Resp: 18 18 18 18   Height:      Weight:   235 lb 1.6 oz (106.641 kg)   SpO2: 97% 98% 98% 98%   I&O's:   Intake/Output Summary (Last 24 hours) at 07/05/14 1201 Last data filed at 07/05/14 1000  Gross per 24 hour  Intake 1589.53 ml  Output   3800 ml  Net -2210.47 ml   TELEMETRY: Reviewed telemetry pt in NSR:     PHYSICAL EXAM General: Well developed, well nourished, in no acute distress Head: Eyes PERRLA, No xanthomas.   Normal cephalic and atramatic Epistaxis from right nare Lungs:   Clear bilaterally to auscultation and percussion. Heart:   HRRR S1 S2 Pulses are 2+ & equal. Abdomen: Bowel sounds are positive, abdomen soft and non-tender without masses Extremities:   No clubbing, cyanosis or edema.  DP +1, pedal edema 2+ Neuro: Alert and oriented X 3. Psych:  Good affect, responds appropriately   LABS: Basic Metabolic Panel:  Recent Labs  16/96/78 2242 07/04/14 0355 07/05/14 0500  NA 137 141 139  K 4.8 3.5 2.9*  CL 109 106 102  CO2 15* 26 29  GLUCOSE 129* 105* 84  BUN 25* 23 20  CREATININE 1.44* 1.36* 1.10  CALCIUM 9.1 8.8 8.4  MG 2.0  --   --    Liver Function Tests:  Recent Labs  07/03/14 2242  AST 37  ALT 37  ALKPHOS 138*  BILITOT 1.3*  PROT 7.0  ALBUMIN 3.5   No results for input(s): LIPASE, AMYLASE in the last 72 hours. CBC:  Recent Labs  07/03/14 1251 07/03/14 2242 07/05/14 0500  WBC 7.4 5.5 5.2  NEUTROABS 5.9 3.9  --   HGB 12.8* 12.8* 11.8*  HCT 39.8 40.1 36.8*  MCV 78.4 80.0 78.8  PLT 167.0 174 PLATELET CLUMPS NOTED ON SMEAR, COUNT APPEARS ADEQUATE   Cardiac Enzymes: No results for input(s): CKTOTAL, CKMB,  CKMBINDEX, TROPONINI in the last 72 hours. BNP: Invalid input(s): POCBNP D-Dimer: No results for input(s): DDIMER in the last 72 hours. Hemoglobin A1C: No results for input(s): HGBA1C in the last 72 hours. Fasting Lipid Panel: No results for input(s): CHOL, HDL, LDLCALC, TRIG, CHOLHDL, LDLDIRECT in the last 72 hours. Thyroid Function Tests:  Recent Labs  07/03/14 2242  TSH 0.941   Anemia Panel: No results for input(s): VITAMINB12, FOLATE, FERRITIN, TIBC, IRON, RETICCTPCT in the last 72 hours. Coag Panel:   Lab Results  Component Value Date   INR 1.76* 07/05/2014   INR 1.93* 07/04/2014   INR 2.28* 07/03/2014    RADIOLOGY: Dg Chest 2 View  07/04/2014   CLINICAL DATA:  Initial evaluation for shortness of breath and fluid retention  EXAM: CHEST  2 VIEW  COMPARISON:  06/15/2014  FINDINGS: Moderate cardiac silhouette enlargement. Vascular pattern normal. Stable cardiac pacer. Right lung is clear. On the left, there is stable mild blunting of the costophrenic angle. The left lung is otherwise clear.  IMPRESSION: No acute findings. Stable mild left costophrenic angle blunting and stable moderate cardiac enlargement.   Electronically Signed   By: Edgar Frisk.D.  On: 07/04/2014 08:59   Dg Chest 2 View  06/16/2014   CLINICAL DATA:  Cough.  Shortness of breath.  EXAM: CHEST  2 VIEW  COMPARISON:  CT 04/02/2014.  Chest x-ray 04/02/2014.  FINDINGS: Severe cardiomegaly. Prior CABG. Cardiac pacer and lead tips in right atrium and right ventricle. Bilateral pulmonary alveolar infiltrates with small left pleural effusion. These findings consistent congestive heart failure. No pneumothorax.  IMPRESSION: 1. Cardiomegaly. Prior CABG. Cardiac pacer and lead tips in right atrium and right ventricle. 2. Congestive heart failure with bilateral pleural interstitial edema and small left pleural effusion.   Electronically Signed   By: Maisie Fus  Register   On: 06/16/2014 08:13   ASSESSMENT AND PLAN:  1.  Shortness of breath - I suspect that his dyspnea is multifactorial from underlying GERD, CHF with worsening LVF and anemia. Breathing improved with diuresis.  2. Chronic combined systolic/diastolic CHF class IIII - primarily right sided: He appears volume overloaded on exam with increased LE edema and increased abdominal girth although lungs are clear. He has diuresed 6.6L since admission and weight is down 16 lbs overnight. Renal function stable and creatinine improved with diuresis. Continue BB/Hydralazine/nitrates. Not on ACE I due to renal insuff. The etiology of his worsening LVF since CABG is unclear and could be related to early graft failure vs. Viral DCM ( he has not had any illness recently) or idiopathic. TSH is normal. He is maintaining NSR so the unlikely to be tachycardia mediated. If renal function remains stable will add Aldactone at discharge. I have recommended right and left heart catheterization to determined if he has had graft failure. Coumadin on hold for cath. INR 1.76 so should be ok for cath tomorrow.   3. CAD s/p CABG 12/02/13:Continue aspirin, statin, beta blocker.  4. PAF (paroxysmal atrial fibrillation): Maintaining NSR. Continue BB. Coumadin on hold for cath.  Had epistaxis this am so will d/c IV Heparin. 5. Mild to moderate aortic stenosis by recent echo - Mean gradient was and prior to CABG was . AS may be underestimated now due to low EF. Will assess at heart cath.  6. Essential hypertension: controlled. Continue BB/Hydralazine 7. Dyslipidemia: LDL at goal at 42. Continue statin.  8. Bradycardia s/p MDT pacemaker: Follow up with the EP as planned.  9. OSA s/p BiPAP titration to 20/16cm H2O  10. Hypokalemia - replete 11. Acute on Chronic renal insufficiency stage III - creatinine improved with diuresis.  12. Iron def anemia s/p recent polypectomy in stomach in January for benign lesion 13.  Epistaxis - I will get ENT to see to  evaluate for possible cautery - if he needs PCI tomorrow he will need further anticoagulation    [name], MD  07/05/2014  12:01 PM

## 2014-07-06 ENCOUNTER — Encounter (HOSPITAL_COMMUNITY): Payer: Self-pay | Admitting: Physician Assistant

## 2014-07-06 ENCOUNTER — Encounter (HOSPITAL_COMMUNITY)
Admission: EM | Disposition: A | Discharge status: Home / Self Care | Payer: Self-pay | Source: Home / Self Care | Attending: Cardiology

## 2014-07-06 ENCOUNTER — Encounter (HOSPITAL_COMMUNITY): Payer: Self-pay

## 2014-07-06 LAB — CBC
HCT: 37.7 % — ABNORMAL LOW (ref 39.0–52.0)
Hemoglobin: 11.7 g/dL — ABNORMAL LOW (ref 13.0–17.0)
MCH: 25.1 pg — ABNORMAL LOW (ref 26.0–34.0)
MCHC: 31 g/dL (ref 30.0–36.0)
MCV: 80.7 fL (ref 78.0–100.0)
PLATELETS: 128 10*3/uL — AB (ref 150–400)
RBC: 4.67 MIL/uL (ref 4.22–5.81)
RDW: 20.2 % — ABNORMAL HIGH (ref 11.5–15.5)
WBC: 4.7 10*3/uL (ref 4.0–10.5)

## 2014-07-06 LAB — BASIC METABOLIC PANEL
ANION GAP: 5 (ref 5–15)
BUN: 21 mg/dL (ref 6–23)
CO2: 31 mmol/L (ref 19–32)
Calcium: 8.6 mg/dL (ref 8.4–10.5)
Chloride: 102 mmol/L (ref 96–112)
Creatinine, Ser: 1.23 mg/dL (ref 0.50–1.35)
GFR calc non Af Amer: 56 mL/min — ABNORMAL LOW (ref >90)
GFR, EST AFRICAN AMERICAN: 65 mL/min — AB (ref >90)
Glucose, Bld: 100 mg/dL — ABNORMAL HIGH (ref 70–99)
Potassium: 3 mmol/L — ABNORMAL LOW (ref 3.5–5.1)
Sodium: 138 mmol/L (ref 135–145)

## 2014-07-06 LAB — HEPARIN LEVEL (UNFRACTIONATED)
Heparin Unfractionated: <0.1 IU/mL — ABNORMAL LOW (ref 0.30–0.70)
Heparin Unfractionated: 0.3 IU/mL (ref 0.30–0.70)

## 2014-07-06 LAB — PROTIME-INR
INR: 1.35 (ref 0.00–1.49)
Prothrombin Time: 16.8 seconds — ABNORMAL HIGH (ref 11.6–15.2)

## 2014-07-06 LAB — POTASSIUM: POTASSIUM: 3.6 mmol/L (ref 3.5–5.1)

## 2014-07-06 SURGERY — LEFT AND RIGHT HEART CATHETERIZATION WITH CORONARY ANGIOGRAM
Anesthesia: LOCAL

## 2014-07-06 MED ORDER — SODIUM CHLORIDE 0.9 % IJ SOLN
3.0000 mL | Freq: Two times a day (BID) | INTRAMUSCULAR | Status: DC
  Administered 2014-07-06: 3 mL via INTRAVENOUS

## 2014-07-06 MED ORDER — ASPIRIN 81 MG PO CHEW
81.0000 mg | CHEWABLE_TABLET | ORAL | Status: AC
  Administered 2014-07-07: 81 mg via ORAL
  Filled 2014-07-06: qty 1

## 2014-07-06 MED ORDER — SODIUM CHLORIDE 0.9 % IV SOLN
INTRAVENOUS | Status: DC
  Administered 2014-07-07: 05:00:00 via INTRAVENOUS

## 2014-07-06 MED ORDER — ASPIRIN EC 81 MG PO TBEC
81.0000 mg | DELAYED_RELEASE_TABLET | Freq: Every day | ORAL | Status: DC
  Administered 2014-07-08 – 2014-07-09 (×2): 81 mg via ORAL
  Filled 2014-07-06 (×3): qty 1

## 2014-07-06 MED ORDER — HEPARIN (PORCINE) IN NACL 100-0.45 UNIT/ML-% IJ SOLN
1550.0000 [IU]/h | INTRAMUSCULAR | Status: DC
  Administered 2014-07-06 (×2): 1550 [IU]/h via INTRAVENOUS
  Filled 2014-07-06 (×3): qty 250

## 2014-07-06 MED ORDER — SODIUM CHLORIDE 0.9 % IJ SOLN: 3.0000 mL | INTRAMUSCULAR | Status: DC | PRN

## 2014-07-06 MED ORDER — SODIUM CHLORIDE 0.9 % IV SOLN: 250.0000 mL | INTRAVENOUS | Status: DC | PRN

## 2014-07-06 NOTE — Care Management Note (Signed)
    Page 1 of 1   07/09/2014     2:40:56 PM CARE MANAGEMENT NOTE 07/09/2014  Patient:  Brandon Klein, Brandon Klein   Account Number:  [id number]  Date Initiated:  07/06/2014  Documentation initiated by:  Bolden Hagerman  Subjective/Objective Assessment:   Pt adm on 07/03/14 with CHF exacerbation.  PTA, pt independent, resides with spouse.     Action/Plan:   Will follow for dc needs as pt progresses.  Cath today.   Anticipated DC Date:  07/08/2014   Anticipated DC Plan:  HOME W HOME HEALTH SERVICES      DC Planning Services  CM consult      Choice offered to / List presented to:             Status of service:  Completed, signed off Medicare Important Message given?  YES (If response is "NO", the following Medicare IM given date fields will be blank) Date Medicare IM given:  07/06/2014 Medicare IM given by:  Ski Polich Date Additional Medicare IM given:  07/09/2014 Additional Medicare IM given by:  Kambrea Carrasco  Discharge Disposition:  HOME/SELF CARE  Per UR Regulation:  Reviewed for med. necessity/level of care/duration of stay  If discussed at Long Length of Stay Meetings, dates discussed:    Comments:

## 2014-07-06 NOTE — Progress Notes (Signed)
Patient's BP 96/40 this AM.  Dr. Tresa Endo made aware, ordered to reschedule carvedilol until patient more awake.  Carvedilol rescheduled to 0800 per verbal order.  Patient made aware, will continue to monitor.

## 2014-07-06 NOTE — Progress Notes (Signed)
Patient had 8 beat run of V-tach, patient asymptomatic.  Dr. Adolm Joseph, on-call MD notified via text page.  No additional orders at this time.  Will continue to monitor.

## 2014-07-06 NOTE — Progress Notes (Signed)
Patient: Brandon Klein / Admit Date: 07/03/2014 / Date of Encounter: 07/06/2014, 7:54 AM   Subjective: Feeling much better. LEE going down. SOB improving. Epistaxis resolved. For cath today.   Objective: Telemetry: NSR occasional PACs/PVCs, occ A pacing and AV pacing Physical Exam: Blood pressure 96/40, pulse 69, temperature 97.9 F (36.6 C), temperature source Oral, resp. rate 18, height 5\' 7"  (1.702 m), weight 235 lb 3.2 oz (106.686 kg), SpO2 95 %. General: Well developed, well nourished WM in no acute distress. Head: Normocephalic, atraumatic, sclera non-icteric, no xanthomas, nares are without discharge. Neck: Negative for carotid bruits. JVP not elevated. Lungs: Clear bilaterally to auscultation without wheezes, rales, or rhonchi. Breathing is unlabored. Heart: RRR S1 S2 with soft SEM Abdomen: Soft, non-tender, non-distended with normoactive bowel sounds. No rebound/guarding. Extremities: No clubbing or cyanosis. 1+ BLE edema. Distal pedal pulses are 2+ and equal bilaterally. Neuro: Alert and oriented X 3. Moves all extremities spontaneously. Psych:  Responds to questions appropriately with a normal affect.   Intake/Output Summary (Last 24 hours) at 07/06/14 0754 Last data filed at 07/06/14 0600  Gross per 24 hour  Intake   1376 ml  Output   2825 ml  Net  -1449 ml    Inpatient Medications:  . aspirin EC  81 mg Oral Daily  . carvedilol  12.5 mg Oral BID WC  . dextromethorphan  30 mg Oral QHS  . docusate sodium  100 mg Oral Daily  . furosemide  80 mg Intravenous BID  . hydrALAZINE  10 mg Oral TID  . isosorbide mononitrate  30 mg Oral Daily  . potassium chloride  40 mEq Oral BID  . sodium chloride  3 mL Intravenous Q12H  . sodium chloride  3 mL Intravenous Q12H   Infusions:  . sodium chloride 10 mL/hr at 07/06/14 0539    Labs:  Recent Labs  07/03/14 2242  07/05/14 0500 07/06/14 0505  NA 137  < > 139 138  K 4.8  < > 2.9* 3.0*  CL 109  < > 102 102  CO2 15*  < >  29 31  GLUCOSE 129*  < > 84 100*  BUN 25*  < > 20 21  CREATININE 1.44*  < > 1.10 1.23  CALCIUM 9.1  < > 8.4 8.6  MG 2.0  --   --   --   < > = values in this interval not displayed.  Recent Labs  07/03/14 2242  AST 37  ALT 37  ALKPHOS 138*  BILITOT 1.3*  PROT 7.0  ALBUMIN 3.5    Recent Labs  07/03/14 1251 07/03/14 2242 07/05/14 0500 07/06/14 0505  WBC 7.4 5.5 5.2 4.7  NEUTROABS 5.9 3.9  --   --   HGB 12.8* 12.8* 11.8* 11.7*  HCT 39.8 40.1 36.8* 37.7*  MCV 78.4 80.0 78.8 80.7  PLT 167.0 174 PLATELET CLUMPS NOTED ON SMEAR, COUNT APPEARS ADEQUATE 128*   No results for input(s): CKTOTAL, CKMB, TROPONINI in the last 72 hours. Invalid input(s): POCBNP No results for input(s): HGBA1C in the last 72 hours.   Radiology/Studies:  Dg Chest 2 View  07/04/2014   CLINICAL DATA:  Initial evaluation for shortness of breath and fluid retention  EXAM: CHEST  2 VIEW  COMPARISON:  06/15/2014  FINDINGS: Moderate cardiac silhouette enlargement. Vascular pattern normal. Stable cardiac pacer. Right lung is clear. On the left, there is stable mild blunting of the costophrenic angle. The left lung is otherwise clear.  IMPRESSION: No acute  findings. Stable mild left costophrenic angle blunting and stable moderate cardiac enlargement.   Electronically Signed   By: Esperanza Heir M.D.   On: 07/04/2014 08:59   Dg Chest 2 View  06/16/2014   CLINICAL DATA:  Cough.  Shortness of breath.  EXAM: CHEST  2 VIEW  COMPARISON:  CT 04/02/2014.  Chest x-ray 04/02/2014.  FINDINGS: Severe cardiomegaly. Prior CABG. Cardiac pacer and lead tips in right atrium and right ventricle. Bilateral pulmonary alveolar infiltrates with small left pleural effusion. These findings consistent congestive heart failure. No pneumothorax.  IMPRESSION: 1. Cardiomegaly. Prior CABG. Cardiac pacer and lead tips in right atrium and right ventricle. 2. Congestive heart failure with bilateral pleural interstitial edema and small left pleural  effusion.   Electronically Signed   By: Maisie Fus  Register   On: 06/16/2014 08:13     Assessment and Plan  1. Shortness of breath - suspect that his dyspnea is multifactorial from underlying GERD, CHF with worsening LVF and anemia. Breathing improved with diuresis.   2. Chronic combined systolic/diastolic CHF class IIII - primarily right sided with recently decreased EF from prior (normal in 10/2013, 20% 05/2014): The etiology of his worsening LVF since CABG is unclear and could be related to early graft failure vs. viral DCM (he has not had any illness recently) or idiopathic. TSH is normal.He is maintaining NSR so the unlikely to be tachycardia mediated.Coumadin on hold for right/left heart cath - he is on the add-on board for today. BP running softer but this has been the case since admission on current med regimen. Renal function generally stable (improved since adm). Will hold Lasix today in prep for cath - will continue KCl given hypokalemia but may need adjustment tomorrow based on what we end up doing with Lasix. Weight is down 19lb, -8.3L. Note weight was 270 several months ago - he reports poor appetite in general at home. Will need to keep an eye on the trajectory to exclude more sinister causes. Not on ACEI due to renal insuff/soft BP. Will add hold parameters for BP to upcoming meds. If renal function remains stable Dr. [name] recommends to add aldactone at discharge.  3. 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 For cath today. Risks and benefits of cardiac catheterization have been discussed with the patient.  These include bleeding, infection, kidney damage, stroke, heart attack, death.  The patient understands these risks and is willing to proceed.   4. Mild to moderate aortic stenosis by recent echo 05/2014 - Mean gradient was and prior to CABG was . AS may be underestimated now due to low EF. Will assess at heart cath.   5. PAF (paroxysmal  atrial fibrillation): Maintaining NSR. Coumadin on hold, on heparin per pharmacy - resuming this AM. 6. Recent GIB with benign gastric polypectomy 05/2014, uneventful 7. Essential hypertension 8. Dyslipidemia: LDL at goal at 42 9. Bradycardia s/p MDT pacemaker 10. OSA s/p BiPAP titration to 20/16cm H2O  11.Hypokalemia 12.Acute on Chronic renal insufficiency stage III  13. Epistaxis - surveillance for further bleed per ENT 14. Anemia/thrombocytopenia - follow  Signed, Dayna Dunn PA-C  I have seen and examined the patient along with Dayna Dunn PA-C.  I have reviewed the chart, notes and new data.  I agree with PA's note.  Key new complaints: dyspnea resolved Key examination changes: mild residual edema Key new findings / data: very little RV pacing and low burden of AF - neither can be implicated as etiology for  cardiomyopathy  PLAN: Coronary/graft angio today.  Thurmon Fair, MD, [facility] [phone] 07/06/2014, 2:42 PM

## 2014-07-06 NOTE — Progress Notes (Signed)
Pt's cath deferred til tomorrow due to full schedule. Corine Shelter made patient aware. Deborha Payment wrote updated pre-cath orders (no IVF given CHF). D/w Dr. Royann Shivers - hold off on resuming Lasix since cath will be in AM. Ronie Spies PA-C

## 2014-07-06 NOTE — Progress Notes (Signed)
ANTICOAGULATION CONSULT NOTE - Follow Up Consult  Pharmacy Consult for Heparin  Indication: atrial fibrillation  Allergies  Allergen Reactions  . Acetaminophen-Codeine Other (See Comments)    Extreme constipation   . Amoxicillin Itching and Rash    Unknown-maybe a rash?  . Diovan [Valsartan] Other (See Comments)    Unknown     Patient Measurements: Height: 5\' 7"  (170.2 cm) Weight: 235 lb 3.2 oz (106.686 kg) IBW/kg (Calculated) : 66.1  Vital Signs: Temp: 97.9 F (36.6 C) (02/08 0439) Temp Source: Oral (02/08 0439) BP: 96/40 mmHg (02/08 0439) Pulse Rate: 69 (02/08 0439)  Labs:  Recent Labs  07/03/14 1251 07/03/14 2242 07/04/14 0355 07/04/14 1518  07/05/14 0500 07/05/14 0936 07/05/14 1705 07/06/14 0505  HGB 12.8* 12.8*  --   --   --  11.8*  --   --  11.7*  HCT 39.8 40.1  --   --   --  36.8*  --   --  37.7*  PLT 167.0 174  --   --   --  PLATELET CLUMPS NOTED ON SMEAR, COUNT APPEARS ADEQUATE  --   --  128*  APTT 37.8* 41*  --   --   --   --   --   --   --   LABPROT 27.2* 25.3*  --  22.2*  --  20.7*  --   --  16.8*  INR 2.5* 2.28*  --  1.93*  --  1.76*  --   --  1.35  HEPARINUNFRC  --   --   --   --   < >  --  0.30 <0.10* <0.10*  CREATININE 1.47 1.44* 1.36*  --   --  1.10  --   --  1.23  < > = values in this interval not displayed.  Estimated Creatinine Clearance: 62.3 mL/min (by C-G formula based on Cr of 1.23).   Assessment: 74 yo admitted 07/03/2014  for SOB due to multifactorial issues. He has been on coumadin for PAF. Pharmacy consulted to dose heparin when INR subtherapeutic pending cath 2/8.  PMH: HTN, HLD, PAF, CAD, AS, PVC, CHF EF 20%,   Anticoagulation: Afib, warfarin PTA: 5 mg daily except 7.5 mg M&F, INR subtherapeutic.  Epistaxis 2/7, heparin held, seen by ENT, all epistaxsis has been self limiting.  CBC stable, no further bleeding.    Infectious Disease: afeb, wbc wnl  Cardiovascular: HTN, HLD, PAF, CAD, AS, PVC, CHF EF 20%, - Coreg, lasix,  hydralazine, imdur K 40 bid K 3.0  Gastrointestinal / Nutrition: GERD -  Nephrology: CrCl 60-65 ml/min, k 3.0 on kdur> lasix held today for cath cont 40 bid today  Pulmonary: RA  PTA Medication Issues: PTA Furo 40 bid, MgOx 500 bid Best Practices  Goal of Therapy:  Heparin level 0.3-0.7 units/ml Monitor platelets by anticoagulation protocol: Yes   Plan:   Resume heparin drip at 1550 units/hr -1500 HL -Daily CBC/HL -Monitor for bleeding -Follow up after cath for anticoagulation plan  Thank you for allowing pharmacy to be a part of this patients care team.  Lovenia Kim Pharm.D., BCPS, AQ-Cardiology Clinical Pharmacist 07/06/2014 8:25 AM Pager: [phone] Phone: [phone]

## 2014-07-06 NOTE — Progress Notes (Signed)
Patient experiencing neck stiffness and soreness, rated 6/10.  Patient afebrile.  PRN medication for pain not available at this time, patient aware and willing to try repositioning and heating pad.  Will continue to monitor.

## 2014-07-06 NOTE — Progress Notes (Signed)
ANTICOAGULATION CONSULT NOTE - Follow Up Consult  Pharmacy Consult for heparin Indication: atrial fibrillation  Allergies  Allergen Reactions  . Acetaminophen-Codeine Other (See Comments)    Extreme constipation   . Amoxicillin Itching and Rash    Unknown-maybe a rash?  . Diovan [Valsartan] Other (See Comments)    Unknown     Patient Measurements: Height: 5\' 7"  (170.2 cm) Weight: 235 lb 3.2 oz (106.686 kg) IBW/kg (Calculated) : 66.1 Heparin Dosing Weight: 90 kg  Vital Signs: Temp: 98 F (36.7 C) (02/08 1442) Temp Source: Oral (02/08 1442) BP: 117/66 mmHg (02/08 1442) Pulse Rate: 71 (02/08 1442)  Labs:  Recent Labs  07/03/14 2242 07/04/14 0355 07/04/14 1518  07/05/14 0500  07/05/14 1705 07/06/14 0505 07/06/14 1449  HGB 12.8*  --   --   --  11.8*  --   --  11.7*  --   HCT 40.1  --   --   --  36.8*  --   --  37.7*  --   PLT 174  --   --   --  PLATELET CLUMPS NOTED ON SMEAR, COUNT APPEARS ADEQUATE  --   --  128*  --   APTT 41*  --   --   --   --   --   --   --   --   LABPROT 25.3*  --  22.2*  --  20.7*  --   --  16.8*  --   INR 2.28*  --  1.93*  --  1.76*  --   --  1.35  --   HEPARINUNFRC  --   --   --   < >  --   < > <0.10* <0.10* 0.30  CREATININE 1.44* 1.36*  --   --  1.10  --   --  1.23  --   < > = values in this interval not displayed.  Estimated Creatinine Clearance: 62.3 mL/min (by C-G formula based on Cr of 1.23).   Medications:  Scheduled:  . aspirin EC  81 mg Oral Daily  . carvedilol  12.5 mg Oral BID WC  . dextromethorphan  30 mg Oral QHS  . docusate sodium  100 mg Oral Daily  . hydrALAZINE  10 mg Oral TID  . isosorbide mononitrate  30 mg Oral Daily  . potassium chloride  40 mEq Oral BID  . sodium chloride  3 mL Intravenous Q12H  . sodium chloride  3 mL Intravenous Q12H   Infusions:  . sodium chloride 10 mL/hr at 07/06/14 0539  . heparin 1,550 Units/hr (07/06/14 4917)    Assessment: 74 yo male with afib is currently on therapeutic heparin.   Heparin level is 0.3.   Goal of Therapy:  Heparin level 0.3-0.7 units/ml Monitor platelets by anticoagulation protocol: Yes   Plan:  - continue heparin at 1550 units/hr for now - f/u after cath  Maeghan Canny, Tsz-Yin 07/06/2014,4:13 PM

## 2014-07-07 ENCOUNTER — Ambulatory Visit (HOSPITAL_COMMUNITY): Admission: RE | Admit: 2014-07-07 | Payer: Medicare Other | Source: Ambulatory Visit | Admitting: Cardiovascular Disease

## 2014-07-07 ENCOUNTER — Encounter (HOSPITAL_COMMUNITY)
Admission: EM | Disposition: A | Discharge status: Home / Self Care | Payer: Self-pay | Source: Home / Self Care | Attending: Cardiology

## 2014-07-07 ENCOUNTER — Encounter (HOSPITAL_COMMUNITY): Payer: Self-pay | Admitting: Cardiovascular Disease

## 2014-07-07 DIAGNOSIS — I2510 Atherosclerotic heart disease of native coronary artery without angina pectoris: Secondary | ICD-10-CM

## 2014-07-07 HISTORY — PX: LEFT AND RIGHT HEART CATHETERIZATION WITH CORONARY/GRAFT ANGIOGRAM: SHX5448

## 2014-07-07 LAB — BASIC METABOLIC PANEL
Anion gap: 11 (ref 5–15)
BUN: 21 mg/dL (ref 6–23)
CO2: 23 mmol/L (ref 19–32)
Calcium: 9 mg/dL (ref 8.4–10.5)
Chloride: 102 mmol/L (ref 96–112)
Creatinine, Ser: 1.13 mg/dL (ref 0.50–1.35)
GFR calc Af Amer: 73 mL/min — ABNORMAL LOW (ref >90)
GFR calc non Af Amer: 63 mL/min — ABNORMAL LOW (ref >90)
GLUCOSE: 97 mg/dL (ref 70–99)
Potassium: 4.3 mmol/L (ref 3.5–5.1)
SODIUM: 136 mmol/L (ref 135–145)

## 2014-07-07 LAB — POCT I-STAT 3, ART BLOOD GAS (G3+)
Bicarbonate: 23.6 mEq/L (ref 20.0–24.0)
O2 SAT: 98 %
TCO2: 25 mmol/L (ref 0–100)
pCO2 arterial: 35.3 mmHg (ref 35.0–45.0)
pH, Arterial: 7.433 (ref 7.350–7.450)
pO2, Arterial: 94 mmHg (ref 80.0–100.0)

## 2014-07-07 LAB — CBC
HEMATOCRIT: 40.4 % (ref 39.0–52.0)
Hemoglobin: 12.6 g/dL — ABNORMAL LOW (ref 13.0–17.0)
MCH: 25.4 pg — AB (ref 26.0–34.0)
MCHC: 31.2 g/dL (ref 30.0–36.0)
MCV: 81.5 fL (ref 78.0–100.0)
PLATELETS: 130 10*3/uL — AB (ref 150–400)
RBC: 4.96 MIL/uL (ref 4.22–5.81)
RDW: 20.4 % — AB (ref 11.5–15.5)
WBC: 4.6 10*3/uL (ref 4.0–10.5)

## 2014-07-07 LAB — PROTIME-INR
INR: 1.26 (ref 0.00–1.49)
Prothrombin Time: 15.9 seconds — ABNORMAL HIGH (ref 11.6–15.2)

## 2014-07-07 LAB — HEPARIN LEVEL (UNFRACTIONATED): HEPARIN UNFRACTIONATED: 0.3 [IU]/mL (ref 0.30–0.70)

## 2014-07-07 SURGERY — LEFT AND RIGHT HEART CATHETERIZATION WITH CORONARY/GRAFT ANGIOGRAM
Anesthesia: LOCAL

## 2014-07-07 MED ORDER — CARVEDILOL 12.5 MG PO TABS
12.5000 mg | ORAL_TABLET | Freq: Once | ORAL | Status: DC
Start: 2014-07-07 — End: 2014-07-07
  Filled 2014-07-07: qty 1

## 2014-07-07 MED ORDER — SODIUM CHLORIDE 0.9 % IV SOLN
INTRAVENOUS | Status: AC
  Administered 2014-07-07: 300 mL via INTRAVENOUS

## 2014-07-07 MED ORDER — FENTANYL CITRATE 0.05 MG/ML IJ SOLN
INTRAMUSCULAR | Status: AC
  Filled 2014-07-07: qty 2

## 2014-07-07 MED ORDER — LIDOCAINE HCL (PF) 1 % IJ SOLN
INTRAMUSCULAR | Status: AC
  Filled 2014-07-07: qty 30

## 2014-07-07 MED ORDER — HYDRALAZINE HCL 25 MG PO TABS
25.0000 mg | ORAL_TABLET | Freq: Three times a day (TID) | ORAL | Status: DC
  Administered 2014-07-08 – 2014-07-09 (×4): 25 mg via ORAL
  Filled 2014-07-07 (×8): qty 1

## 2014-07-07 MED ORDER — FUROSEMIDE 10 MG/ML IJ SOLN: 40.0000 mg | Freq: Two times a day (BID) | INTRAMUSCULAR | Status: DC

## 2014-07-07 MED ORDER — HEPARIN (PORCINE) IN NACL 100-0.45 UNIT/ML-% IJ SOLN
1650.0000 [IU]/h | INTRAMUSCULAR | Status: DC
  Administered 2014-07-07 – 2014-07-09 (×3): 1650 [IU]/h via INTRAVENOUS
  Filled 2014-07-07 (×4): qty 250

## 2014-07-07 MED ORDER — WARFARIN - PHARMACIST DOSING INPATIENT: Freq: Every day | Status: DC

## 2014-07-07 MED ORDER — MIDAZOLAM HCL 2 MG/2ML IJ SOLN
INTRAMUSCULAR | Status: AC
  Filled 2014-07-07: qty 2

## 2014-07-07 MED ORDER — HEPARIN (PORCINE) IN NACL 2-0.9 UNIT/ML-% IJ SOLN
INTRAMUSCULAR | Status: AC
Start: 2014-07-07 — End: 2014-07-07
  Filled 2014-07-07: qty 1500

## 2014-07-07 MED ORDER — NITROGLYCERIN 1 MG/10 ML FOR IR/CATH LAB
INTRA_ARTERIAL | Status: AC
  Filled 2014-07-07: qty 10

## 2014-07-07 MED ORDER — WARFARIN SODIUM 7.5 MG PO TABS
7.5000 mg | ORAL_TABLET | Freq: Once | ORAL | Status: AC
  Administered 2014-07-07: 7.5 mg via ORAL
  Filled 2014-07-07: qty 1

## 2014-07-07 MED ORDER — FUROSEMIDE 80 MG PO TABS
80.0000 mg | ORAL_TABLET | Freq: Two times a day (BID) | ORAL | Status: DC
  Administered 2014-07-07 – 2014-07-09 (×4): 80 mg via ORAL
  Filled 2014-07-07 (×8): qty 1

## 2014-07-07 NOTE — Progress Notes (Addendum)
ANTICOAGULATION CONSULT NOTE - Follow Up Consult  Pharmacy Consult for heparin Indication: atrial fibrillation  Allergies  Allergen Reactions  . Acetaminophen-Codeine Other (See Comments)    Extreme constipation   . Amoxicillin Itching and Rash    Unknown-maybe a rash?  . Diovan [Valsartan] Other (See Comments)    Unknown     Patient Measurements: Height: 5\' 7"  (170.2 cm) Weight: 237 lb 7 oz (107.7 kg) IBW/kg (Calculated) : 66.1 Heparin Dosing Weight: 90 kg  Vital Signs: Temp: 98.1 F (36.7 C) (02/09 1039) Temp Source: Oral (02/09 1039) BP: 123/69 mmHg (02/09 1039) Pulse Rate: 67 (02/09 1039)  Labs:  Recent Labs  07/05/14 0500  07/06/14 0505 07/06/14 1449 07/07/14 0513  HGB 11.8*  --  11.7*  --  12.6*  HCT 36.8*  --  37.7*  --  40.4  PLT PLATELET CLUMPS NOTED ON SMEAR, COUNT APPEARS ADEQUATE  --  128*  --  130*  LABPROT 20.7*  --  16.8*  --  15.9*  INR 1.76*  --  1.35  --  1.26  HEPARINUNFRC  --   < > <0.10* 0.30 0.30  CREATININE 1.10  --  1.23  --  1.13  < > = values in this interval not displayed.  Estimated Creatinine Clearance: 68.1 mL/min (by C-G formula based on Cr of 1.13).    Assessment: 74 yo male with afib.  Heparin turned off for cath today.  This morning his HL was therapeutic at 0.3 on 1550 units/hr.  D/w Dr. Clifton Christopherjohn- he wants heparin to be resumed 8 hrs after sheath out and does not want coumadin to resume today.  H/H stable, PLTC low at 130.  No bleeding reported.     Goal of Therapy:  Heparin level 0.3-0.7 units/ml Monitor platelets by anticoagulation protocol: Yes   Plan:  - resume heparin at 1650 units/hr 8 hrs after sheath out- 1800 -anticipate that coumadin will be resumed tomorrow -daily HL, CBC, INR  Herby Abraham, Pharm.D. [phone] 07/07/2014 10:58 AM  Addendum: pharmacy consulted to resume coumadin. Home dose 5 daily except 7.5 mg M/F.  Epistaxis 2/7 has resolved.  Last dose of coumadin 2/4. Was held for cath.  INR 1.26  after being held x 4 days.  Plan: coumadin 7.5 mg po x 1 dose Daily INR  Herby Abraham, Pharm.D. [phone] 07/07/2014 2:13 PM

## 2014-07-07 NOTE — CV Procedure (Signed)
Cardiac Catheterization Operative Report  Brandon Klein [id number] 2/9/20168:56 AM Gaye Alken, MD  Procedure Performed:  1. Left Heart Catheterization 2. Selective Coronary Angiography 3. Right Heart Catheterization 4. SVG angiography 5. LIMA graft angiography  Operator: Verne Carrow, MD  Indication:  74 yo male with history of AS, CAD s/p 4V CABG                                Procedure Details: The risks, benefits, complications, treatment options, and expected outcomes were discussed with the patient. The patient and/or family concurred with the proposed plan, giving informed consent. The patient was brought to the cath lab after IV hydration was begun and oral premedication was given. The patient was further sedated with Versed and Fentanyl. The patient was prepped and draped in the usual manner. Using the modified Seldinger access technique, a 5 French sheath was placed in the femoral artery. A 6 French sheath was inserted into the right femoral vein. A balloon tipped catheter was used to perform a right heart catheterization. Standard diagnostic catheters were used to perform selective coronary angiography. A pigtail catheter was used to perform a left ventricular angiogram. There were no immediate complications. The patient was taken to the recovery area in stable condition.   Hemodynamic Findings: Ao:   103/55             LV: 119/3/21 RA: 12          RV: 48/4/14 PA: 49/13 (mean 30)     PCWP: 18 Fick Cardiac Output: 4.95 L/min Fick Cardiac Index: 2.2 L/min/m2 Central Aortic Saturation: 98% Pulmonary Artery Saturation: 64%  Aortic valve data: Peak to peak gradient 15 mm Hg                             AVA: 1.61 cm2                              Angiographic Findings:  Left main: 20% mid stenosis  Left Anterior Descending Artery: Large caliber vessel that courses to the apex. 100% mid stenosis  Circumflex Artery: Large caliber vessel with  40% mid stenosis. The small to moderate caliber intermediate branch has 50-60% proximal stenosis. The first obtuse marginal branch is a large caliber vessel with 95% mid stenosis followed by a 90% stenosis. The obtuse marginal fills both antegrade and from the patent vein graft.   Right Coronary Artery: Large dominant vessel with serial 80% stenoses in the distal vessel. The vein graft to this distal vessel is no longer patent.   Graft Anatomy:  SVG to PDA is occluded SVG to OM is patent. The graft fills the distal segment of the obtuse marginal. Just proximal to the graft insertion the native vessel has a 90% stenosis.  SVG to Diagonal is patent LIMA to mid LAD is patent  Left Ventricular Angiogram: Deferred  Impression: 1. 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 Moderately severe distal RCA stenosis, this vessel is no longer protected by the graft 3. Ischemic cardiomyopathy 4. Moderate aortic stenosis although severity may be underestimated given low LVEF  Recommendations: He appears to remain volume overloaded. I would resume diuresis today. I cannot explain the drop in his LVEF by the occlusion of the graft to the PDA.  His native RCA has moderately severe distal disease but good flow. This could be easily approached with PCI in the future. For now, would not recommend PCI of the RCA.        Complications:  None; patient tolerated the procedure well.

## 2014-07-07 NOTE — Progress Notes (Signed)
Site area: RFA/RFV Site Prior to Removal:  Level 0 Pressure Applied For:24min Manual:  yes  Patient Status During Pull:  stable Post Pull Site:  Level 0 Post Pull Instructions Given:yes   Post Pull Pulses Present: palpable Dressing Applied:  clear Bedrest begins @ 1000 Comments:

## 2014-07-07 NOTE — Progress Notes (Signed)
Patient Name: Brandon Klein Date of Encounter: 07/07/2014     Active Problems:   CAD s/p PCI 1997/PTCA 2002/CABG 11/2013   Dyslipidemia   HTN (hypertension)   PAF (paroxysmal atrial fibrillation)   Chronic anticoagulation   AS (aortic stenosis)   Hypokalemia   Acute on chronic systolic CHF (congestive heart failure), NYHA class 3   SOB (shortness of breath)    SUBJECTIVE  Denies any CP or SOB.   CURRENT MEDS . [START ON 07/08/2014] aspirin EC  81 mg Oral Daily  . carvedilol  12.5 mg Oral BID WC  . dextromethorphan  30 mg Oral QHS  . docusate sodium  100 mg Oral Daily  . furosemide  40 mg Intravenous BID  . hydrALAZINE  10 mg Oral TID  . isosorbide mononitrate  30 mg Oral Daily  . potassium chloride  40 mEq Oral BID  . sodium chloride  3 mL Intravenous Q12H    OBJECTIVE  Filed Vitals:   07/07/14 0950 07/07/14 0955 07/07/14 0959 07/07/14 1039  BP: 131/75 119/66 120/70 123/69  Pulse: 63 59 61 67  Temp:    98.1 F (36.7 C)  TempSrc:    Oral  Resp: Height:      Weight:      SpO2: 93% 95% 97% 97%    Intake/Output Summary (Last 24 hours) at 07/07/14 1301 Last data filed at 07/07/14 0522  Gross per 24 hour  Intake 824.38 ml  Output    475 ml  Net 349.38 ml   Filed Weights   07/05/14 0541 07/06/14 0439 07/07/14 0502  Weight: 235 lb 1.6 oz (106.641 kg) 235 lb 3.2 oz (106.686 kg) 237 lb 7 oz (107.7 kg)    PHYSICAL EXAM  General: Pleasant, NAD. Neuro: Alert and oriented X 3. Moves all extremities spontaneously. Psych: Normal affect. HEENT:  Normal  Neck: Supple without bruits or JVD. Lungs:  Resp regular and unlabored, anterior exam CTA. Heart: RRR no s3, s4, or murmurs. Abdomen: Soft, non-tender, non-distended, BS + x 4.  R groin cath site stable.  Extremities: No clubbing, cyanosis or edema. DP/PT/Radials 2+ and equal bilaterally.  Accessory Clinical Findings  CBC  Recent Labs  07/06/14 0505 07/07/14 0513  WBC 4.7 4.6  HGB 11.7* 12.6*   HCT 37.7* 40.4  MCV 80.7 81.5  PLT 128* 130*   Basic Metabolic Panel  Recent Labs  07/06/14 0505 07/06/14 1510 07/07/14 0513  NA 138  --  136  K 3.0* 3.6 4.3  CL 102  --  102  CO2 31  --  23  GLUCOSE 100*  --  97  BUN 21  --  21  CREATININE 1.23  --  1.13  CALCIUM 8.6  --  9.0    TELE Atrial paced rhythm with HR 60-70s, occasional PVCs    ECG  No new EKG  Echocardiogram 06/18/2014  LV EF: 20%  ------------------------------------------------------------------- Indications:   Dyspnea (R06.00). Aortic stenosis (I35.0).  ------------------------------------------------------------------- History:  PMH: Acquired from the patient and from the patient&'s chart. 2-3/6 Systolic murmur. Atrial fibrillation. Coronary artery disease. Mild aortic stenosis. Risk factors: Hypertension. Morbidly obese. Dyslipidemia.  ------------------------------------------------------------------- Study Conclusions  - Left ventricle: The cavity size was moderately dilated. Wall thickness was normal. Systolic function was severely reduced. The estimated ejection fraction was 20%. Diffuse hypokinesis. Doppler parameters are consistent with restrictive physiology, indicative of decreased left ventricular diastolic compliance and/or increased left atrial pressure. - Aortic valve: Valve mobility  was restricted. There was moderate stenosis. There was trivial regurgitation. Mean gradient (S): 13 mm Hg. Peak gradient (S): 19 mm Hg. Valve area (VTI): 1.26 cm^2. Valve area (Vmax): 1.3 cm^2. Valve area (Vmean): 1.26 cm^2. - Mitral valve: There was mild regurgitation. - Left atrium: The atrium was severely dilated. - Right ventricle: The cavity size was mildly dilated. Systolic function was moderately reduced. - Right atrium: The atrium was mildly to moderately dilated. - Atrial septum: There was an atrial septal aneurysm. - Pulmonary arteries: Systolic pressure was  mildly increased. PA peak pressure: 40 mm Hg (S).  Impressions:  - Severe global reduction in LV function; restrictive filling; biatrial enlargment; calcified aortic valve with moderate AS by continuity equation; mild MR; moderately reduced RV function.     Radiology/Studies  Dg Chest 2 View  07/04/2014   CLINICAL DATA:  Initial evaluation for shortness of breath and fluid retention  EXAM: CHEST  2 VIEW  COMPARISON:  06/15/2014  FINDINGS: Moderate cardiac silhouette enlargement. Vascular pattern normal. Stable cardiac pacer. Right lung is clear. On the left, there is stable mild blunting of the costophrenic angle. The left lung is otherwise clear.  IMPRESSION: No acute findings. Stable mild left costophrenic angle blunting and stable moderate cardiac enlargement.   Electronically Signed   By: Esperanza Heir M.D.   On: 07/04/2014 08:59   Dg Chest 2 View  06/16/2014   CLINICAL DATA:  Cough.  Shortness of breath.  EXAM: CHEST  2 VIEW  COMPARISON:  CT 04/02/2014.  Chest x-ray 04/02/2014.  FINDINGS: Severe cardiomegaly. Prior CABG. Cardiac pacer and lead tips in right atrium and right ventricle. Bilateral pulmonary alveolar infiltrates with small left pleural effusion. These findings consistent congestive heart failure. No pneumothorax.  IMPRESSION: 1. Cardiomegaly. Prior CABG. Cardiac pacer and lead tips in right atrium and right ventricle. 2. Congestive heart failure with bilateral pleural interstitial edema and small left pleural effusion.   Electronically Signed   By: Maisie Fus  Register   On: 06/16/2014 08:13    ASSESSMENT AND PLAN  1. Shortness of breath - suspect that his dyspnea is multifactorial from underlying GERD, CHF with worsening LVF and anemia. Breathing improved with diuresis.   2. 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 SVG to PDA occluded. Patent SVG to OM, SVG to Diag, LIMA to mid LAD  - drop in EF out of proportion to occlusion of SVG to PDA. ?component of NICM. Has moderately severe distal RCA stenosis which could be approached with PCI in the future  - continue coreg, Imdur, hydralazine. Potentially uptitrate coreg and add ACEI/ARB later. Continue IV diuresis. H/o pacemaker. May need to consider LifeVest +/- ICD if EF does not improve in 3 month.    3. 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   4. Mild to moderate aortic stenosis by recent echo 05/2014 - Mean gradient was and prior to CABG was . AS may be underestimated now due to low EF.   5. PAF (paroxysmal atrial fibrillation): Maintaining NSR.   - restart coumadin. D/C heparin once therapeutic  6. Recent GIB with benign gastric polypectomy 05/2014, uneventful 7. Essential hypertension 8. Dyslipidemia: LDL at goal at 42 9. Bradycardia s/p MDT pacemaker 10. OSA s/p BiPAP titration  to 20/16cm H2O  11.Hypokalemia 12.Acute on Chronic renal insufficiency stage III  13. Epistaxis - surveillance for further bleed per ENT 14. Anemia/thrombocytopenia - follow   Signed, Amedeo Plenty Pager: [phone]  I have seen and examined the patient along with Azalee Course PA-C.  I have reviewed the chart, notes and new data.  I agree with PA's note.  Key new complaints: asymptomatic now Key examination changes: no overt CHF Key new findings / data: mean PAWP 18 mm Hg by cath  PLAN: Mildly hypervolemic Cause of cardiomyopathy is elusive. Agree it cannot be blamed on loss of one SVG, with patent (albeit stenotic) native RCA. Continue with enhanced medical rx. Resume diuretics, but switch to PO. Monitor for contrast nephrotoxicity over next 48 hours. Add ACEi/ARB at discharge if renal function does not deteriorate. BP allows increase in  vasodilator dose. Increase carvedilol after another 1L or so net diuresis.  Thurmon Fair, MD, [facility] [phone] 07/07/2014, 1:41 PM

## 2014-07-07 NOTE — Progress Notes (Signed)
Patient arrived from procedure, alert and oriented,with no c/o of distress or pain noted. Vitals taken and documented. Will continue to monitot. Marin Roberts RN

## 2014-07-08 ENCOUNTER — Encounter (HOSPITAL_COMMUNITY): Payer: Self-pay

## 2014-07-08 LAB — CBC
HCT: 36.5 % — ABNORMAL LOW (ref 39.0–52.0)
Hemoglobin: 11.5 g/dL — ABNORMAL LOW (ref 13.0–17.0)
MCH: 25.6 pg — ABNORMAL LOW (ref 26.0–34.0)
MCHC: 31.5 g/dL (ref 30.0–36.0)
MCV: 81.1 fL (ref 78.0–100.0)
PLATELETS: 123 10*3/uL — AB (ref 150–400)
RBC: 4.5 MIL/uL (ref 4.22–5.81)
RDW: 20.4 % — AB (ref 11.5–15.5)
WBC: 4 10*3/uL (ref 4.0–10.5)

## 2014-07-08 LAB — HEPARIN LEVEL (UNFRACTIONATED): Heparin Unfractionated: 0.34 IU/mL (ref 0.30–0.70)

## 2014-07-08 LAB — BASIC METABOLIC PANEL
ANION GAP: 10 (ref 5–15)
BUN: 19 mg/dL (ref 6–23)
CO2: 23 mmol/L (ref 19–32)
Calcium: 8.9 mg/dL (ref 8.4–10.5)
Chloride: 104 mmol/L (ref 96–112)
Creatinine, Ser: 1.21 mg/dL (ref 0.50–1.35)
GFR calc Af Amer: 67 mL/min — ABNORMAL LOW (ref >90)
GFR, EST NON AFRICAN AMERICAN: 58 mL/min — AB (ref >90)
GLUCOSE: 100 mg/dL — AB (ref 70–99)
Potassium: 3.8 mmol/L (ref 3.5–5.1)
Sodium: 137 mmol/L (ref 135–145)

## 2014-07-08 LAB — POCT I-STAT 3, VENOUS BLOOD GAS (G3P V)
Acid-Base Excess: 2 mmol/L (ref 0.0–2.0)
Bicarbonate: 26.5 mEq/L — ABNORMAL HIGH (ref 20.0–24.0)
O2 SAT: 64 %
PCO2 VEN: 41.4 mmHg — AB (ref 45.0–50.0)
TCO2: 28 mmol/L (ref 0–100)
pH, Ven: 7.413 — ABNORMAL HIGH (ref 7.250–7.300)
pO2, Ven: 33 mmHg (ref 30.0–45.0)

## 2014-07-08 LAB — PROTIME-INR
INR: 1.26 (ref 0.00–1.49)
PROTHROMBIN TIME: 16 s — AB (ref 11.6–15.2)

## 2014-07-08 MED ORDER — WARFARIN SODIUM 7.5 MG PO TABS
7.5000 mg | ORAL_TABLET | Freq: Once | ORAL | Status: AC
  Administered 2014-07-08: 7.5 mg via ORAL
  Filled 2014-07-08: qty 1

## 2014-07-08 MED ORDER — CARVEDILOL 25 MG PO TABS
25.0000 mg | ORAL_TABLET | Freq: Two times a day (BID) | ORAL | Status: DC
  Administered 2014-07-08 – 2014-07-09 (×2): 25 mg via ORAL
  Filled 2014-07-08 (×4): qty 1

## 2014-07-08 NOTE — Progress Notes (Signed)
ANTICOAGULATION CONSULT NOTE - Follow Up Consult  Pharmacy Consult for heparin and coumadin Indication: atrial fibrillation  Allergies  Allergen Reactions  . Acetaminophen-Codeine Other (See Comments)    Extreme constipation   . Amoxicillin Itching and Rash    Unknown-maybe a rash?  . Diovan [Valsartan] Other (See Comments)    Unknown     Patient Measurements: Height: 5\' 7"  (170.2 cm) Weight: 235 lb 11.2 oz (106.913 kg) IBW/kg (Calculated) : 66.1 Heparin Dosing Weight: 90 kg  Vital Signs: Temp: 98.3 F (36.8 C) (02/10 0526) Temp Source: Oral (02/10 0526) BP: 134/96 mmHg (02/10 0811) Pulse Rate: 80 (02/10 0811)  Labs:  Recent Labs  07/06/14 0505 07/06/14 1449 07/07/14 0513 07/08/14 0500  HGB 11.7*  --  12.6* 11.5*  HCT 37.7*  --  40.4 36.5*  PLT 128*  --  130* 123*  LABPROT 16.8*  --  15.9* 16.0*  INR 1.35  --  1.26 1.26  HEPARINUNFRC <0.10* 0.30 0.30 0.34  CREATININE 1.23  --  1.13 1.21    Estimated Creatinine Clearance: 63.4 mL/min (by C-G formula based on Cr of 1.21).    Assessment: 74 yo male with afib.  S/p cath 2/9 and heparin and coumadin resumed post-cath. HL therapeutic at 0.34 on 1650 units/hr.  INR 1.25 after coumadin 7.5 mg last night.  Coumadin had been held x 4 days pre-cath.    H/H stable, PLTC low at 123.  Recent GIB with benign gastric polypectomy 05/2014.  No bleeding reported. Per MD note, does not need fully therapeutic INR for DC.   home dose 5 daily except 7.5 mg M/F.  Epistaxis 2/7 has resolved.     Goal of Therapy:  Heparin level 0.3-0.7 units/ml Monitor platelets by anticoagulation protocol: Yes   Plan:  - continue heparin at 1650 units/hr - repeat coumadin 7.5 mg po x 1 dose today -daily HL, CBC, INR Herby Abraham, Pharm.D. [phone] 07/08/2014 9:56 AM

## 2014-07-08 NOTE — Progress Notes (Signed)
Patient Name: Brandon Klein Date of Encounter: 07/08/2014  Active Problems:   CAD s/p PCI 1997/PTCA 2002/CABG 11/2013   Dyslipidemia   HTN (hypertension)   PAF (paroxysmal atrial fibrillation)   Chronic anticoagulation   AS (aortic stenosis)   Hypokalemia   Acute on chronic systolic CHF (congestive heart failure), NYHA class 3   SOB (shortness of breath)   Length of Stay: 5  SUBJECTIVE  Feels well. No dyspnea or angina. Maintaining mildly negative fluid balance on oral diuretics. Tolerating increased dose vasodilators.  CURRENT MEDS . aspirin EC  81 mg Oral Daily  . carvedilol  25 mg Oral BID WC  . dextromethorphan  30 mg Oral QHS  . docusate sodium  100 mg Oral Daily  . furosemide  80 mg Oral BID  . hydrALAZINE  25 mg Oral TID  . isosorbide mononitrate  30 mg Oral Daily  . potassium chloride  40 mEq Oral BID  . sodium chloride  3 mL Intravenous Q12H  . Warfarin - Pharmacist Dosing Inpatient   Does not apply q1800    OBJECTIVE   Intake/Output Summary (Last 24 hours) at 07/08/14 0906 Last data filed at 07/08/14 0833  Gross per 24 hour  Intake   1080 ml  Output   1625 ml  Net   -545 ml   Filed Weights   07/06/14 0439 07/07/14 0502 07/08/14 0526  Weight: 106.686 kg (235 lb 3.2 oz) 107.7 kg (237 lb 7 oz) 106.913 kg (235 lb 11.2 oz)    PHYSICAL EXAM Filed Vitals:   07/07/14 1240 07/07/14 2020 07/08/14 0526 07/08/14 0811  BP: 121/70 107/65 126/65 134/96  Pulse: 73 75 65 80  Temp:  98.2 F (36.8 C) 98.3 F (36.8 C)   TempSrc:  Oral Oral   Resp: Height:      Weight:   106.913 kg (235 lb 11.2 oz)   SpO2: 95% 97% 98%    General: Alert, oriented x3, no distress Head: no evidence of trauma, PERRL, EOMI, no exophtalmos or lid lag, no myxedema, no xanthelasma; normal ears, nose and oropharynx Neck: normal jugular venous pulsations and no hepatojugular reflux; brisk carotid pulses without delay and no carotid bruits Chest: clear to auscultation, no signs  of consolidation by percussion or palpation, normal fremitus, symmetrical and full respiratory excursions, sternotomy Cardiovascular: normal position and quality of the apical impulse, regular rhythm, normal first and second heart sounds, no rubs or gallops, early systolic murmur Abdomen: no tenderness or distention, no masses by palpation, no abnormal pulsatility or arterial bruits, normal bowel sounds, no hepatosplenomegaly Extremities: no clubbing, cyanosis or edema; 2+ radial, ulnar and brachial pulses bilaterally; 2+ right femoral, posterior tibial and dorsalis pedis pulses; 2+ left femoral, posterior tibial and dorsalis pedis pulses; no subclavian or femoral bruits Neurological: grossly nonfocal  LABS  CBC  Recent Labs  07/07/14 0513 07/08/14 0500  WBC 4.6 4.0  HGB 12.6* 11.5*  HCT 40.4 36.5*  MCV 81.5 81.1  PLT 130* 123*   Basic Metabolic Panel  Recent Labs  07/07/14 0513 07/08/14 0500  NA 136 137  K 4.3 3.8  CL 102 104  CO2 23 23  GLUCOSE 97 100*  BUN 21 19  CREATININE 1.13 1.21  CALCIUM 9.0 8.9  Radiology Studies Imaging results have been reviewed and No results found.  TELE NSR   ASSESSMENT AND PLAN  1. 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 Increase carvedilol to max dose. Add ACEi/ARB at DC. Need to clarify "Diovan allergy". Discussed Na restriction and daily weights. Return to cardiac rehab after DC. 2. Cardiomyopathy of uncertain etiology - review LVEF and make decision re: AICD in 3 months 3. 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 For cath today. Risks and benefits of cardiac catheterization have been discussed with the patient. These include bleeding, infection, kidney damage, stroke, heart attack, death. The  patient understands these risks and is willing to proceed.  4. Mild to moderate aortic stenosis by recent echo 05/2014 - Mean gradient was and prior to CABG was .Mild-moderate at heart cath also   5. PAF (paroxysmal atrial fibrillation): Maintaining NSR. Coumadin resumed. Does not need fully therapeutic INR for DC 6. Recent GIB with benign gastric polypectomy 05/2014, uneventful 7. Essential hypertension 8. Dyslipidemia: LDL at goal at 42 9. Bradycardia s/p MDT pacemaker 10. OSA s/p BiPAP titration to 20/16cm H2O  11.Hypokalemia, resolved 12.Acute on Chronic renal insufficiency stage III  - markedly improved, likely there was a component of low cardiac output. I think he can tolerate ACEi 13. Epistaxis - surveillance for further bleed per ENT 14. Anemia/thrombocytopenia - follow   Thurmon Fair, MD, [facility] [phone] office [phone] pager 07/08/2014 9:06 AM

## 2014-07-09 ENCOUNTER — Encounter: Payer: Self-pay | Admitting: Physician Assistant

## 2014-07-09 LAB — BASIC METABOLIC PANEL
ANION GAP: 7 (ref 5–15)
BUN: 20 mg/dL (ref 6–23)
CHLORIDE: 104 mmol/L (ref 96–112)
CO2: 26 mmol/L (ref 19–32)
Calcium: 8.7 mg/dL (ref 8.4–10.5)
Creatinine, Ser: 1.09 mg/dL (ref 0.50–1.35)
GFR calc non Af Amer: 65 mL/min — ABNORMAL LOW (ref >90)
GFR, EST AFRICAN AMERICAN: 76 mL/min — AB (ref >90)
Glucose, Bld: 117 mg/dL — ABNORMAL HIGH (ref 70–99)
POTASSIUM: 3.6 mmol/L (ref 3.5–5.1)
Sodium: 137 mmol/L (ref 135–145)

## 2014-07-09 LAB — CBC
HCT: 36.1 % — ABNORMAL LOW (ref 39.0–52.0)
HEMOGLOBIN: 11.7 g/dL — AB (ref 13.0–17.0)
MCH: 25.6 pg — ABNORMAL LOW (ref 26.0–34.0)
MCHC: 32.4 g/dL (ref 30.0–36.0)
MCV: 79 fL (ref 78.0–100.0)
Platelets: 109 10*3/uL — ABNORMAL LOW (ref 150–400)
RBC: 4.57 MIL/uL (ref 4.22–5.81)
RDW: 20 % — ABNORMAL HIGH (ref 11.5–15.5)
WBC: 4.2 10*3/uL (ref 4.0–10.5)

## 2014-07-09 LAB — PROTIME-INR
INR: 1.2 (ref 0.00–1.49)
Prothrombin Time: 15.3 seconds — ABNORMAL HIGH (ref 11.6–15.2)

## 2014-07-09 LAB — HEPARIN LEVEL (UNFRACTIONATED): Heparin Unfractionated: 0.46 IU/mL (ref 0.30–0.70)

## 2014-07-09 MED ORDER — ISOSORBIDE MONONITRATE ER 30 MG PO TB24: 30.0000 mg | ORAL_TABLET | Freq: Every day | ORAL | Status: DC

## 2014-07-09 MED ORDER — POTASSIUM CHLORIDE CRYS ER 20 MEQ PO TBCR: 40.0000 meq | EXTENDED_RELEASE_TABLET | Freq: Two times a day (BID) | ORAL | Status: DC

## 2014-07-09 MED ORDER — CARVEDILOL 25 MG PO TABS: 25.0000 mg | ORAL_TABLET | Freq: Two times a day (BID) | ORAL | Status: DC

## 2014-07-09 MED ORDER — HYDRALAZINE HCL 25 MG PO TABS: 25.0000 mg | ORAL_TABLET | Freq: Three times a day (TID) | ORAL | Status: DC

## 2014-07-09 MED ORDER — FUROSEMIDE 80 MG PO TABS: 80.0000 mg | ORAL_TABLET | Freq: Two times a day (BID) | ORAL | Status: DC

## 2014-07-09 NOTE — Discharge Summary (Signed)
Discharge Summary   Patient ID: Brandon Klein,  MRN: [id number], DOB/AGE: 1940/10/27 74 y.o.  Admit date: 07/03/2014 Discharge date: 07/09/2014  Primary Care Provider: Gaye Alken Primary Cardiologist: Dr. [name]  Discharge Diagnoses Principal Problem:   Acute on chronic systolic CHF (congestive heart failure), NYHA class 3 Active Problems:   CAD s/p PCI 1997/PTCA 2002/CABG 11/2013   Dyslipidemia   HTN (hypertension)   PAF (paroxysmal atrial fibrillation)   Chronic anticoagulation   AS (aortic stenosis)   Hypokalemia   SOB (shortness of breath)   Allergies Allergies  Allergen Reactions  . Acetaminophen-Codeine Other (See Comments)    Extreme constipation   . Amoxicillin Itching and Rash    Unknown-maybe a rash?  . Diovan [Valsartan] Other (See Comments)    Unknown     Procedures  Cardiac catheterization Hemodynamic Findings: Ao: 103/55  LV: 119/3/21 RA: 12  RV: 48/4/14 PA: 49/13 (mean 30)  PCWP: 18 Fick Cardiac Output: 4.95 L/min Fick Cardiac Index: 2.2 L/min/m2 Central Aortic Saturation: 98% Pulmonary Artery Saturation: 64%  Aortic valve data: Peak to peak gradient 15 mm Hg  AVA: 1.61 cm2   - Left ventricle: The cavity size was normal. There was mild concentric hypertrophy. Systolic function was normal. The estimated ejection fraction was in the range of 60% to 65%. Wall motion was normal; there were no regional wall motion abnormalities. Doppler parameters are consistent with abnormal left ventricular relaxation (grade 1 diastolic dysfunction). There was no evidence of elevated ventricular filling pressure by Doppler parameters. - Aortic valve: Trileaflet; moderately thickened, moderately calcified leaflets. Transvalvular velocity was minimally increased. There was mild stenosis. Mean gradient (S): 7 mm Hg. Peak gradient (S): 14 mm Hg. Valve  area (VTI): 0.93 cm^2. Valve area (Vmax): 0.99 cm^2. Valve area (Vmean): 1 cm^2. - Aortic root: The aortic root was normal in size. - Mitral valve: Structurally normal valve. There was moderate regurgitation. - Left atrium: The atrium was normal in size. - Right ventricle: The cavity size was mildly dilated. Wall thickness was normal. - Right atrium: The atrium was mildly dilated. - Tricuspid valve: There was mild regurgitation. - Pulmonic valve: There was mild regurgitation. - Pulmonary arteries: Systolic pressure was at the upper limits of normal. PA peak pressure: 38 mm Hg (S). - Inferior vena cava: The vessel was normal in size. - Pericardium, extracardiac: There was no pericardial effusion.    Hospital Course  The patient is a 74 year old male with history of chronic combined systolic and diastolic heart failure, CAD, PAF, history of mild to moderate aortic stenosis, hypertension, dyslipidemia, CKD and OSA.  Patient was directly admitted by Dr. [name] on 07/04/2014 for increasing shortness of breath which is likely multifactorial from underlying GERD, CHF and anemia.  Previous echocardiogram obtained on 06/18/2014 showed EF 20%, diffuse hypokinesis, moderate AS, PA peak pressure 40 mmHg.  Whereas his previous echocardiogram in June 2015 showed EF 55-60%.  It is unclear at this time what caused this decline.  Treatment options were discussed with the patient, it was recommended for him to undergo left and right heart cath to definitively assess underlying coronary anatomy.  His Coumadin was held.  Otolaryngology service was consulted on admission as patient had significant recent epistaxis, no acute intervention was recommended as he has no further Bleeding episode.  He underwent planned cardiac cath on 07/07/2014 which showed occluded SVG to PDA, patent SVG to OM, SVG to diagonal, LIMA to mid LAD.  His wedge pressure was mildly elevated at 18.  It was felt that the occlusion of his SVG  to PDA cannot adequately worked explained by the drop in the LVEF. He was placed on medical therapy for potential nonischemic myopathy along with further diuresis.  Patient was seen in the morning of 07/09/2014, he has no further shortness of breath.  He is doing well on oral Lasix and has no significant renal injury from cardiac catheterization. His right groin cath site appears to be stable without significant bleeding or hematoma.  He is deemed stable for discharge from cardiology perspective.  His Coumadin has been restarted and he has a scheduled outpatient PT/INR checked next Wednesday.  I will arrange follow-up with Dr. [name].  He will need echocardiogram in 3 months to assess but are not he needed ICD.  Discharge Vitals Blood pressure 102/52, pulse 61, temperature 97.8 F (36.6 C), temperature source Oral, resp. rate 16, height  (1.702 m), weight 235 lb 11.2 oz (106.913 kg), SpO2 98 %.  Filed Weights   07/07/14 0502 07/08/14 0526 07/09/14 0513  Weight: 237 lb 7 oz (107.7 kg) 235 lb 11.2 oz (106.913 kg) 235 lb 11.2 oz (106.913 kg)    Labs  CBC  Recent Labs  07/08/14 0500 07/09/14 0536  WBC 4.0 4.2  HGB 11.5* 11.7*  HCT 36.5* 36.1*  MCV 81.1 79.0  PLT 123* 109*   Basic Metabolic Panel  Recent Labs  07/08/14 0500 07/09/14 0536  NA 137 137  K 3.8 3.6  CL 104 104  CO2 23 26  GLUCOSE 100* 117*  BUN 19 20  CREATININE 1.21 1.09  CALCIUM 8.9 8.7    Disposition  Pt is being discharged home today in good condition.  Follow-up Plans & Appointments      Follow-up Information    Follow up with [facility] On 07/15/2014.   Specialty:  Cardiology   Why:  3:00pm   Contact information:   [address] [place] [address] [phone]      Follow up with [name], MD On 08/14/2014.   Specialty:  Cardiology   Why:  3:15pm   Contact information:   [address] [place]  [address] [phone]       Discharge Medications    Medication List    TAKE these medications        ANTIVERT 25 MG tablet  Generic drug:  meclizine  Take 25 mg by mouth daily as needed for dizziness.     aspirin 81 MG EC tablet  Take 1 tablet (81 mg total) by mouth daily.     atorvastatin 20 MG tablet  Commonly known as:  LIPITOR  Take 20 mg by mouth daily at 6 PM.     carvedilol 25 MG tablet  Commonly known as:  COREG  Take 1 tablet (25 mg total) by mouth 2 (two) times daily with a meal.     DELSYM COUGH/COLD DAYTIME PO  Take 10 mLs by mouth 2 (two) times daily as needed (cold).     docusate sodium 100 MG capsule  Commonly known as:  COLACE  Take 200 mg by mouth every morning.     ferrous sulfate 325 (65 FE) MG tablet  Take 325 mg by mouth daily with breakfast.     fexofenadine 180 MG tablet  Commonly known as:  ALLEGRA  Take 180 mg by mouth every morning.     FLONASE 50 MCG/ACT nasal spray  Generic drug:  fluticasone  Place 2 sprays into the nose every morning.     furosemide 80 MG tablet  Commonly known as:  LASIX  Take 1 tablet (80 mg total) by mouth 2 (two) times daily.     GLUCOSAMINE CHONDR 1500 COMPLX Caps  Take 1 capsule by mouth every morning.     hydrALAZINE 25 MG tablet  Commonly known as:  APRESOLINE  Take 1 tablet (25 mg total) by mouth 3 (three) times daily.     isosorbide mononitrate 30 MG 24 hr tablet  Commonly known as:  IMDUR  Take 1 tablet (30 mg total) by mouth daily.     L-Lysine 500 MG Caps  Take 1 capsule by mouth every morning.     Magnesium Oxide 250 MG Tabs  Take 2 tablets (500 mg total) by mouth 2 (two) times daily.     montelukast 10 MG tablet  Commonly known as:  SINGULAIR  Take 1 tablet (10 mg total) by mouth at bedtime.     multivitamin tablet  Take 1 tablet by mouth every morning.     nitroGLYCERIN 0.4 MG SL tablet  Commonly known as:  NITROSTAT  Place 1 tablet (0.4 mg total) under the tongue every 5 (five)  minutes as needed for chest pain.     ONE TOUCH ULTRA TEST test strip  Generic drug:  glucose blood  1 each by Other route every morning.     ONETOUCH DELICA LANCETS FINE Misc  1 each by Other route every morning.     pantoprazole 40 MG tablet  Commonly known as:  PROTONIX  Take 40 mg by mouth every morning.     potassium chloride SA 20 MEQ tablet  Commonly known as:  K-DUR,KLOR-CON  Take 2 tablets (40 mEq total) by mouth 2 (two) times daily.     traMADol 50 MG tablet  Commonly known as:  ULTRAM  1-2 every 4 hours as needed for cough or pain     VITAMIN B-12 PO  Take 1 tablet by mouth every morning.     Vitamin D 2000 UNITS tablet  Take 2,000 Units by mouth daily.     warfarin 5 MG tablet  Commonly known as:  COUMADIN  Take as directed by coumadin clinic        Outstanding Labs/Studies  PT/INR check next week  Duration of Discharge Encounter   Greater than 30 minutes including physician time.  Ramond Dial PA-C Pager: [phone] 07/09/2014, 10:57 AM

## 2014-07-09 NOTE — Discharge Instructions (Signed)

## 2014-07-09 NOTE — Progress Notes (Signed)
Patient Name: Brandon Klein Date of Encounter: 07/09/2014  Primary Cardiologist: Dr. [name]   Active Problems:   CAD s/p PCI 1997/PTCA 2002/CABG 11/2013   Dyslipidemia   HTN (hypertension)   PAF (paroxysmal atrial fibrillation)   Chronic anticoagulation   AS (aortic stenosis)   Hypokalemia   Acute on chronic systolic CHF (congestive heart failure), NYHA class 3   SOB (shortness of breath)    SUBJECTIVE  Feeling good, no SOB or CP  CURRENT MEDS . aspirin EC  81 mg Oral Daily  . carvedilol  25 mg Oral BID WC  . dextromethorphan  30 mg Oral QHS  . docusate sodium  100 mg Oral Daily  . furosemide  80 mg Oral BID  . hydrALAZINE  25 mg Oral TID  . isosorbide mononitrate  30 mg Oral Daily  . potassium chloride  40 mEq Oral BID  . sodium chloride  3 mL Intravenous Q12H  . Warfarin - Pharmacist Dosing Inpatient   Does not apply q1800    OBJECTIVE  Filed Vitals:   07/08/14 1500 07/08/14 2046 07/09/14 0513 07/09/14 0817  BP: 99/69 106/67 117/76 102/52  Pulse: 77 67 71 61  Temp: 98.3 F (36.8 C) 98 F (36.7 C) 97.8 F (36.6 C) 97.8 F (36.6 C)  TempSrc: Oral Oral Oral Oral  Resp: 16 16 18 16   Height:      Weight:   235 lb 11.2 oz (106.913 kg)   SpO2: 97% 96% 96% 98%    Intake/Output Summary (Last 24 hours) at 07/09/14 0911 Last data filed at 07/09/14 3559  Gross per 24 hour  Intake 1763.4 ml  Output   2650 ml  Net -886.6 ml   Filed Weights   07/07/14 0502 07/08/14 0526 07/09/14 0513  Weight: 237 lb 7 oz (107.7 kg) 235 lb 11.2 oz (106.913 kg) 235 lb 11.2 oz (106.913 kg)    PHYSICAL EXAM  General: Pleasant, NAD. Neuro: Alert and oriented X 3. Moves all extremities spontaneously. Psych: Normal affect. HEENT:  Normal  Neck: Supple without bruits or JVD. Lungs:  Resp regular and unlabored, CTA. Heart: RRR no s3, s4. 1/6 systolic murmur Abdomen: Soft, non-tender, non-distended, BS + x 4.  Extremities: No clubbing, cyanosis or edema. DP/PT/Radials 2+ and equal  bilaterally.  Accessory Clinical Findings  CBC  Recent Labs  07/08/14 0500 07/09/14 0536  WBC 4.0 4.2  HGB 11.5* 11.7*  HCT 36.5* 36.1*  MCV 81.1 79.0  PLT 123* 109*   Basic Metabolic Panel  Recent Labs  07/08/14 0500 07/09/14 0536  NA 137 137  K 3.8 3.6  CL 104 104  CO2 23 26  GLUCOSE 100* 117*  BUN 19 20  CREATININE 1.21 1.09  CALCIUM 8.9 8.7    TELE Paced rhythm, sinus, no recurrent a-fib    ECG  No new EKG  Echocardiogram 06/18/2014  LV EF: 20%  ------------------------------------------------------------------- Indications:   Dyspnea (R06.00). Aortic stenosis (I35.0).  ------------------------------------------------------------------- History:  PMH: Acquired from the patient and from the patient&'s chart. 2-3/6 Systolic murmur. Atrial fibrillation. Coronary artery disease. Mild aortic stenosis. Risk factors: Hypertension. Morbidly obese. Dyslipidemia.  ------------------------------------------------------------------- Study Conclusions  - Left ventricle: The cavity size was moderately dilated. Wall thickness was normal. Systolic function was severely reduced. The estimated ejection fraction was 20%. Diffuse hypokinesis. Doppler parameters are consistent with restrictive physiology, indicative of decreased left ventricular diastolic compliance and/or increased left atrial pressure. - Aortic valve: Valve mobility was restricted. There was moderate stenosis. There  was trivial regurgitation. Mean gradient (S): 13 mm Hg. Peak gradient (S): 19 mm Hg. Valve area (VTI): 1.26 cm^2. Valve area (Vmax): 1.3 cm^2. Valve area (Vmean): 1.26 cm^2. - Mitral valve: There was mild regurgitation. - Left atrium: The atrium was severely dilated. - Right ventricle: The cavity size was mildly dilated. Systolic function was moderately reduced. - Right atrium: The atrium was mildly to moderately dilated. - Atrial septum: There was an  atrial septal aneurysm. - Pulmonary arteries: Systolic pressure was mildly increased. PA peak pressure: 40 mm Hg (S).  Impressions:  - Severe global reduction in LV function; restrictive filling; biatrial enlargment; calcified aortic valve with moderate AS by continuity equation; mild MR; moderately reduced RV function.    Radiology/Studies  Dg Chest 2 View  07/04/2014   CLINICAL DATA:  Initial evaluation for shortness of breath and fluid retention  EXAM: CHEST  2 VIEW  COMPARISON:  06/15/2014  FINDINGS: Moderate cardiac silhouette enlargement. Vascular pattern normal. Stable cardiac pacer. Right lung is clear. On the left, there is stable mild blunting of the costophrenic angle. The left lung is otherwise clear.  IMPRESSION: No acute findings. Stable mild left costophrenic angle blunting and stable moderate cardiac enlargement.   Electronically Signed   By: Esperanza Heir M.D.   On: 07/04/2014 08:59   Dg Chest 2 View  06/16/2014   CLINICAL DATA:  Cough.  Shortness of breath.  EXAM: CHEST  2 VIEW  COMPARISON:  CT 04/02/2014.  Chest x-ray 04/02/2014.  FINDINGS: Severe cardiomegaly. Prior CABG. Cardiac pacer and lead tips in right atrium and right ventricle. Bilateral pulmonary alveolar infiltrates with small left pleural effusion. These findings consistent congestive heart failure. No pneumothorax.  IMPRESSION: 1. Cardiomegaly. Prior CABG. Cardiac pacer and lead tips in right atrium and right ventricle. 2. Congestive heart failure with bilateral pleural interstitial edema and small left pleural effusion.   Electronically Signed   By: Maisie Fus  Register   On: 06/16/2014 08:13    ASSESSMENT AND PLAN  1. 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 Unable to explain the drop in EF by occlusion of SVG to PDA. Wedge  pressure 18, diuresed further. Currently euvolemic  - continue coreg, hydralazine, Imdur. Unable to add ACEI due to borderline BP  - renal function stable after cath.   - INR 1.20 today despite coumadin. Will need to continue coumadin and recheck INR next Wed. Discharge today. Followup with Dr. [name]  2. Cardiomyopathy of uncertain etiology - review LVEF and make decision re: AICD in 3 months  3. 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   - see cath report 07/07/2014  4. Mild to moderate aortic stenosis by recent echo 05/2014 - Mean gradient was and prior to CABG was .Mild-moderate at heart cath also   5. PAF (paroxysmal atrial fibrillation): Maintaining NSR. Coumadin resumed. Does not need fully therapeutic INR for DC  6. Recent GIB with benign gastric polypectomy 05/2014, uneventful 7. Essential hypertension 8. Dyslipidemia: LDL at goal at 42 9. Bradycardia s/p MDT pacemaker 10.OSA s/p BiPAP titration to 20/16cm H2O  11.Hypokalemia, resolved 12.Acute on Chronic renal insufficiency stage III - markedly improved, likely there was a component of low cardiac output. I think he can tolerate ACEi 13. Epistaxis - surveillance for further bleed per ENT 14. Anemia/thrombocytopenia - follow  Ramond Dial PA-C Pager: [phone] I have seen and examined the patient along with Azalee Course PA-C.  I have reviewed the chart, notes and new data.  I agree with PA/NP's note.  Clinically euvolemic, maintaining slight negative fluid balance on oral diuretics. BP low normal, asymptomatic.  PLAN: Try to switch to RAAS inhibitor for greater efficacy for CHF and more convenient dosing schedule than hydralazine/nitrates. However: Note history of refractory cough, avoid ACEi and history of adverse reaction to Diovan (he does not recall what that was).  DC home, early f/u Echo in 3 months to decide whether an ICD is recommended  Thurmon Fair, MD,  [facility] [phone] 07/09/2014, 10:37 AM

## 2014-07-09 NOTE — Progress Notes (Signed)
Discharged patient per MD orders. IVs removed and telemetry cleaned and returned to nurses station. Reviewed home medication list and CHF education with patient. Pt verbalized understanding. Pt is stable for discharge. All belongings with pt. Volunteer notified to transport pt to car.

## 2014-07-10 ENCOUNTER — Encounter (HOSPITAL_COMMUNITY)
Admission: RE | Admit: 2014-07-10 | Discharge: 2014-07-10 | Disposition: A | Discharge status: Home / Self Care | Payer: Medicare Other | Source: Ambulatory Visit | Attending: Cardiology | Admitting: Cardiology

## 2014-07-13 ENCOUNTER — Encounter (HOSPITAL_COMMUNITY): Payer: Self-pay

## 2014-07-15 ENCOUNTER — Ambulatory Visit (INDEPENDENT_AMBULATORY_CARE_PROVIDER_SITE_OTHER): Payer: Medicare Other | Admitting: *Deleted

## 2014-07-15 ENCOUNTER — Encounter (HOSPITAL_COMMUNITY)
Admission: RE | Admit: 2014-07-15 | Discharge: 2014-07-15 | Disposition: A | Discharge status: Home / Self Care | Payer: Self-pay | Source: Ambulatory Visit | Attending: Cardiology | Admitting: Cardiology

## 2014-07-15 DIAGNOSIS — I4891 Unspecified atrial fibrillation: Secondary | ICD-10-CM

## 2014-07-15 LAB — POCT INR: INR: 1.6

## 2014-07-17 ENCOUNTER — Encounter (HOSPITAL_COMMUNITY)
Admission: RE | Admit: 2014-07-17 | Discharge: 2014-07-17 | Disposition: A | Discharge status: Home / Self Care | Payer: Self-pay | Source: Ambulatory Visit | Attending: Cardiology | Admitting: Cardiology

## 2014-07-20 ENCOUNTER — Encounter (HOSPITAL_COMMUNITY)
Admission: RE | Admit: 2014-07-20 | Discharge: 2014-07-20 | Disposition: A | Discharge status: Home / Self Care | Payer: Self-pay | Source: Ambulatory Visit | Attending: Cardiology | Admitting: Cardiology

## 2014-07-22 ENCOUNTER — Encounter (HOSPITAL_COMMUNITY)
Admission: RE | Admit: 2014-07-22 | Discharge: 2014-07-22 | Disposition: A | Discharge status: Home / Self Care | Payer: Self-pay | Source: Ambulatory Visit | Attending: Cardiology | Admitting: Cardiology

## 2014-07-22 ENCOUNTER — Telehealth: Payer: Self-pay | Admitting: Cardiology

## 2014-07-22 ENCOUNTER — Encounter: Payer: Self-pay | Admitting: *Deleted

## 2014-07-22 ENCOUNTER — Other Ambulatory Visit (INDEPENDENT_AMBULATORY_CARE_PROVIDER_SITE_OTHER): Payer: Medicare Other | Admitting: *Deleted

## 2014-07-22 DIAGNOSIS — I350 Nonrheumatic aortic (valve) stenosis: Secondary | ICD-10-CM

## 2014-07-22 DIAGNOSIS — E785 Hyperlipidemia, unspecified: Secondary | ICD-10-CM

## 2014-07-22 LAB — HEPATIC FUNCTION PANEL
ALK PHOS: 113 U/L (ref 39–117)
ALT: 25 U/L (ref 0–53)
AST: 22 U/L (ref 0–37)
Albumin: 3.8 g/dL (ref 3.5–5.2)
BILIRUBIN DIRECT: 0.3 mg/dL (ref 0.0–0.3)
Total Bilirubin: 0.9 mg/dL (ref 0.2–1.2)
Total Protein: 6.7 g/dL (ref 6.0–8.3)

## 2014-07-22 NOTE — Telephone Encounter (Signed)
Informed patient of Dr. Norris Cross recommendations for dobutamine ECHO.  Patient agrees with treatment plan and ECHO ordered for scheduling.

## 2014-07-22 NOTE — Telephone Encounter (Signed)
Please let patient know that I would like him to have a dobutamine echo for assessment of AS - please have Erie Noe in echo to do the study.  He had moderate AS on last echo but I want to check to see if this was underestimated by echo due to reduced LVF

## 2014-07-22 NOTE — Addendum Note (Signed)
Addended by: Gunnar Fusi A on: 07/22/2014 07:01 PM   Modules accepted: Orders

## 2014-07-24 ENCOUNTER — Ambulatory Visit (INDEPENDENT_AMBULATORY_CARE_PROVIDER_SITE_OTHER): Payer: Medicare Other | Admitting: *Deleted

## 2014-07-24 ENCOUNTER — Encounter (HOSPITAL_COMMUNITY)
Admission: RE | Admit: 2014-07-24 | Discharge: 2014-07-24 | Disposition: A | Discharge status: Home / Self Care | Payer: Self-pay | Source: Ambulatory Visit | Attending: Cardiology | Admitting: Cardiology

## 2014-07-24 DIAGNOSIS — I4891 Unspecified atrial fibrillation: Secondary | ICD-10-CM

## 2014-07-24 LAB — NMR LIPOPROFILE WITH LIPIDS
Cholesterol, Total: 113 mg/dL (ref 100–199)
HDL PARTICLE NUMBER: 22.7 umol/L — AB (ref >=30.5)
HDL Size: 9.8 nm (ref >=9.2)
HDL-C: 40 mg/dL (ref >39)
LDL (calc): 55 mg/dL (ref 0–99)
LDL Particle Number: 643 nmol/L (ref <1000)
LDL SIZE: 20.3 nm (ref >=20.8)
LP-IR Score: 31 (ref <=45)
Large HDL-P: 7.5 umol/L (ref >=4.8)
Large VLDL-P: 2.5 nmol/L (ref <=2.7)
Small LDL Particle Number: 425 nmol/L (ref <=527)
Triglycerides: 91 mg/dL (ref 0–149)
VLDL Size: 43.2 nm (ref <=46.6)

## 2014-07-24 LAB — POCT INR: INR: 1.9

## 2014-07-27 ENCOUNTER — Encounter (HOSPITAL_COMMUNITY)
Admission: RE | Admit: 2014-07-27 | Discharge: 2014-07-27 | Disposition: A | Discharge status: Home / Self Care | Payer: Self-pay | Source: Ambulatory Visit | Attending: Cardiology | Admitting: Cardiology

## 2014-07-29 ENCOUNTER — Encounter (HOSPITAL_COMMUNITY)
Admission: RE | Admit: 2014-07-29 | Discharge: 2014-07-29 | Disposition: A | Discharge status: Home / Self Care | Payer: Self-pay | Source: Ambulatory Visit | Attending: Cardiology | Admitting: Cardiology

## 2014-07-29 DIAGNOSIS — I5023 Acute on chronic systolic (congestive) heart failure: Secondary | ICD-10-CM | POA: Insufficient documentation

## 2014-07-31 ENCOUNTER — Encounter (HOSPITAL_COMMUNITY)
Admission: RE | Admit: 2014-07-31 | Discharge: 2014-07-31 | Disposition: A | Discharge status: Home / Self Care | Payer: Self-pay | Source: Ambulatory Visit | Attending: Cardiology | Admitting: Cardiology

## 2014-08-03 ENCOUNTER — Encounter (HOSPITAL_COMMUNITY)
Admission: RE | Admit: 2014-08-03 | Discharge: 2014-08-03 | Disposition: A | Discharge status: Home / Self Care | Payer: Self-pay | Source: Ambulatory Visit | Attending: Cardiology | Admitting: Cardiology

## 2014-08-04 ENCOUNTER — Ambulatory Visit (HOSPITAL_COMMUNITY): Payer: Medicare Other | Attending: Cardiology

## 2014-08-04 ENCOUNTER — Other Ambulatory Visit (HOSPITAL_COMMUNITY): Payer: Medicare Other | Admitting: *Deleted

## 2014-08-04 ENCOUNTER — Encounter: Payer: Self-pay | Admitting: Physician Assistant

## 2014-08-04 ENCOUNTER — Telehealth: Payer: Self-pay | Admitting: Cardiology

## 2014-08-04 ENCOUNTER — Ambulatory Visit (INDEPENDENT_AMBULATORY_CARE_PROVIDER_SITE_OTHER): Payer: Medicare Other | Admitting: Physician Assistant

## 2014-08-04 ENCOUNTER — Ambulatory Visit (HOSPITAL_BASED_OUTPATIENT_CLINIC_OR_DEPARTMENT_OTHER): Payer: Medicare Other

## 2014-08-04 VITALS — BP 112/82 | HR 75 | Ht 67.0 in | Wt 234.0 lb

## 2014-08-04 DIAGNOSIS — I350 Nonrheumatic aortic (valve) stenosis: Secondary | ICD-10-CM | POA: Insufficient documentation

## 2014-08-04 DIAGNOSIS — I359 Nonrheumatic aortic valve disorder, unspecified: Secondary | ICD-10-CM

## 2014-08-04 DIAGNOSIS — R0989 Other specified symptoms and signs involving the circulatory and respiratory systems: Secondary | ICD-10-CM

## 2014-08-04 DIAGNOSIS — I2510 Atherosclerotic heart disease of native coronary artery without angina pectoris: Secondary | ICD-10-CM

## 2014-08-04 DIAGNOSIS — R001 Bradycardia, unspecified: Secondary | ICD-10-CM

## 2014-08-04 DIAGNOSIS — I10 Essential (primary) hypertension: Secondary | ICD-10-CM

## 2014-08-04 DIAGNOSIS — E785 Hyperlipidemia, unspecified: Secondary | ICD-10-CM

## 2014-08-04 DIAGNOSIS — G4733 Obstructive sleep apnea (adult) (pediatric): Secondary | ICD-10-CM

## 2014-08-04 DIAGNOSIS — I420 Dilated cardiomyopathy: Secondary | ICD-10-CM

## 2014-08-04 DIAGNOSIS — I5022 Chronic systolic (congestive) heart failure: Secondary | ICD-10-CM

## 2014-08-04 DIAGNOSIS — Z8719 Personal history of other diseases of the digestive system: Secondary | ICD-10-CM | POA: Insufficient documentation

## 2014-08-04 DIAGNOSIS — I480 Paroxysmal atrial fibrillation: Secondary | ICD-10-CM

## 2014-08-04 LAB — BASIC METABOLIC PANEL
BUN: 19 mg/dL (ref 6–23)
CHLORIDE: 104 meq/L (ref 96–112)
CO2: 31 mEq/L (ref 19–32)
Calcium: 9.1 mg/dL (ref 8.4–10.5)
Creatinine, Ser: 1.19 mg/dL (ref 0.40–1.50)
GFR: 63.58 mL/min (ref >60.00)
Glucose, Bld: 90 mg/dL (ref 70–99)
Potassium: 3.9 mEq/L (ref 3.5–5.1)
SODIUM: 139 meq/L (ref 135–145)

## 2014-08-04 LAB — BRAIN NATRIURETIC PEPTIDE: PRO B NATRI PEPTIDE: 1701 pg/mL — AB (ref 0.0–100.0)

## 2014-08-04 MED ORDER — POTASSIUM CHLORIDE CRYS ER 20 MEQ PO TBCR: EXTENDED_RELEASE_TABLET | ORAL | Status: DC

## 2014-08-04 MED ORDER — FUROSEMIDE 80 MG PO TABS: ORAL_TABLET | ORAL | Status: DC

## 2014-08-04 MED ORDER — SODIUM CHLORIDE 0.9 % IV SOLN
20.0000 ug/kg | Freq: Once | INTRAVENOUS | Status: AC
  Administered 2014-08-04: 20 ug/kg/min via INTRAVENOUS

## 2014-08-04 NOTE — Patient Instructions (Signed)
Please make the following medication changes:  INCREASE Lasix (Furosemide) 80 mg to 1 and 1/2 tablets (120 mg) twice a day for 3 days. INCREASE Potassium 20 mEq to 3 tablets (60 mEq) twice a day for 3 days.  After 3 days, resume your usual dose of Lasix and Potassium (Lasix 80 mg twice a day and Potassium 20 mEq take 2 tablets ( ) twice a day.)  Labs today: BMET, BNP  Labs in 1 week (can get at appointment with Dr. Armanda Magic):  BMET  Weigh daily and call if:  Weight up 3 lbs in one day or 5 lbs in 7 days.

## 2014-08-04 NOTE — Progress Notes (Signed)
Cardiology Office Note   Date:  08/04/2014   ID:  Brandon Klein, DOB 1940-11-28, MRN [id number]  Patient Care Team: Juluis Rainier, MD as PCP - General (Family Medicine) [name], MD as Consulting Physician (Cardiology) Duke Salvia, MD as Consulting Physician (Clinical Cardiac Electrophysiology)    Chief Complaint  Patient presents with  . Weight Gain  . Congestive Heart Failure     History of Present Illness: Brandon Klein is a 74 y.o. male with a hx of CAD, status post prior stenting to the LAD and balloon angioplasty to the circumflex, paroxysmal atrial fibrillation, bradycardia s/p PPM, aortic stenosis, HTN, HL. LHC in 10/2013 demonstrated 3 vessel CAD and he was referred for CABG with Dr. Donata Clay (LIMA-LAD, SVG-diagonal, SVG-circumflex, SVG-PDA). He developed postoperative atrial fibrillation and was placed on amiodarone.  He has been kept on Coumadin anticoagulation.  Patient had volume overload and was sent home on Lasix.  Sleep testing has demonstrated severe OSA and has been placed on BiPap.  He has had GI bleeding and underwent duodenal polypectomy with Dr. Josetta Huddle at [facility].  Recently he has been followed by pulmonology (Dr. Sherene Sires) for dyspnea and cough.  This was thought to be related to GERD.  He recently was noted to by hypoxic and FU echo demonstrated worsening LVF.  He was admitted from the office 2/5-2/11 with a/c combined systolic and diastolic CHF (primarily R sided).  He was diuresed.  LHC was recommended b/c of reduced LVF.  This demonstrated 3/4 grafts patent (S-PDA occluded).  However, DCM was felt to be out of proportion to CAD.  No intervention was recommended.  Native RCA has moderately severe disease but good flow.  This could be approached with PCI in the future if needed.  CHF medications were adjusted.  He will need a FU echo in 3 mos to reassess LVF (refer to EP if EF < 35%).  Echo and LHC demonstrated mod AS.  Dr. [name] arranged for the patient to have a  dobutamine echo to fully assess his AS.    He called in today with an 8 lb weight gain.  He is very meticulous about tracking his weights.  They have been quite stable until the last few days. He notes some increased abdominal girth.  Denies orthopnea, PND, edema.  Denies chest pain. No syncope or dizziness.  He feels good when he goes to cardiac rehab.  He has not really noticed any significant increased DOE.     Studies/Reports Reviewed Today: - LHC (6/15): Proximal LAD 80%, LAD stent 90% ISR, mid Dx 90%, mid OM 80-90%, distal RCA 70% >>> CABG - Echo (6/15): EF 55-60%, grade 1 diastolic dysfunction, mild aortic stenosis (mean 17 mm Hg), moderate LAE - Nuclear (6/15): Small apical infarct with moderate ischemia in the septum, apex and inferior walls, EF 49%; high risk - Carotid US (7/15): Bilateral 1-39%  Echo 06/18/14 - EF 20%. Diffuse HK. Restrictive physiology - Moderate aortic stenosis. There was trivial regurgitation. Mean gradient (S): 13 mm Hg. Peak gradient (S): 19 mm Hg.  - Mitral valve: There was mild regurgitation. - Left atrium: The atrium was severely dilated. - Right ventricle: The cavity size was mildly dilated. Systolic function was moderately reduced. - Right atrium: The atrium was mildly to moderately dilated. - Atrial septum: There was an atrial septal aneurysm. - PApeak pressure: 40 mm Hg (S).  Cardiac Cath 07/07/14 RA: 12  RV: 48/4/14 PA: 49/13 (mean 30)  PCWP:  18 Fick Cardiac Output: 4.95 L/min Fick Cardiac Index: 2.2 L/min/m2 Central Aortic Saturation: 98% Pulmonary Artery Saturation: 64% Left main: 20% mid stenosis Left Anterior Descending Artery: Large caliber vessel that courses to the apex. 100% mid stenosis Circumflex Artery:  40% mid stenosis. Intermediate branch 50-60% proximal.  OM1 95% mid then 90%.  Right Coronary Artery:  Serial 80% distal vessel. The vein graft to this distal vessel is no longer patent.  Graft Anatomy:  SVG to  PDA is occluded SVG to OM is patent. The graft fills the distal segment of the obtuse marginal. Just proximal to the graft insertion the native vessel has a 90% stenosis.  SVG to Diagonal is patent LIMA to mid LAD is patent   Past Medical History  Diagnosis Date  . Syncope     a. vasovagal with documented bradycardia.  . Pacemaker     a. 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 b. MDT generator replacement 2012.  . Obesity   . HTN (hypertension)   . HLD (hyperlipidemia)   . DJD (degenerative joint disease)   . GERD (gastroesophageal reflux disease)   . Impaired fasting glucose     A1C check every year  . PAF (paroxysmal atrial fibrillation)     on chronic systemic anticoagulation  . Coronary artery disease     a. s/p PCI of LAD 1997. b. Cutting balloon angioplasty to LCx in 2002. c. s/p cath 11/2013 with severe 3 vessel ASCAD s/p CABG with LIMA to LAD, SVG to diag, SVG to left circ and SVG to PDA. d. cath 07/07/2014 occluded SVG to PDA, all other grafts patent. 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 Postoperative atrial fibrillation   . AS (aortic stenosis)     mild by echo 10/2013  . Hypokalemia   . Hypomagnesemia   . Carotid artery plaque     a. Duplex 10/2013: mild calcific plaque origin ICA. Left: intimal wall thickening CCA. 0-39% BICA.  Marland Kitchen PVC's (premature ventricular contractions)   . OSA (obstructive sleep apnea)   . Chronic diastolic CHF (congestive heart failure)   . GI bleeding     a.  with benign gastric polypectomy 05/2014.  Marland Kitchen Epistaxis     Past Surgical History  Procedure Laterality Date  . Cholecystectomy    . Knee arthroscopy  2000    Left  . Angioplasty  1997  . Pacemaker insertion  2002  . Coronary artery bypass graft N/A 12/01/2013    Procedure: CORONARY ARTERY BYPASS GRAFT TIMES FOUR USING LEFT INTERNAL MAMMARY ARTERY TO LAD, SAPHENOUS VEIN GRAFTS TO DIAGONAL, CIRCUMFELX AND POSTERIOR DESCENDING;  Surgeon: Kerin Perna, MD;  Location: [facility] OR;   Service: Open Heart Surgery;  Laterality: N/A;  . Esophagogastroduodenoscopy (egd) with propofol N/A 05/05/2014    Procedure: ESOPHAGOGASTRODUODENOSCOPY (EGD) WITH PROPOFOL;  Surgeon: Charolett Bumpers, MD;  Location: [facility] ENDOSCOPY;  Service: Endoscopy;  Laterality: N/A;  . Colonoscopy with propofol N/A 05/05/2014    Procedure: COLONOSCOPY WITH PROPOFOL;  Surgeon: Charolett Bumpers, MD;  Location: [facility] ENDOSCOPY;  Service: Endoscopy;  Laterality: N/A;  . Left heart catheterization with coronary angiogram N/A 11/25/2013    Procedure: LEFT HEART CATHETERIZATION WITH CORONARY ANGIOGRAM;  Surgeon: [name], MD;  Location: [facility] CATH LAB;  Service: Cardiovascular;  Laterality: N/A;  . Left and right heart catheterization with coronary/graft angiogram N/A 07/07/2014    Procedure: LEFT AND RIGHT HEART CATHETERIZATION WITH Isabel Caprice;  Surgeon: Kathleene Hazel, MD;  Location:  [facility] CATH LAB;  Service: Cardiovascular;  Laterality: N/A;     Current Outpatient Prescriptions  Medication Sig Dispense Refill  . aspirin EC 81 MG EC tablet Take 1 tablet (81 mg total) by mouth daily. (Patient taking differently: Take 81 mg by mouth every morning. )    . atorvastatin (LIPITOR) 20 MG tablet Take 20 mg by mouth daily at 6 PM.     . carvedilol (COREG) 25 MG tablet Take 1 tablet (25 mg total) by mouth 2 (two) times daily with a meal. 60 tablet 5  . Cholecalciferol (VITAMIN D) 2000 UNITS tablet Take 2,000 Units by mouth daily.    . Cyanocobalamin (VITAMIN B-12 PO) Take 1 tablet by mouth every morning.    . docusate sodium (COLACE) 100 MG capsule Take 200 mg by mouth every morning.     . ferrous sulfate 325 (65 FE) MG tablet Take 325 mg by mouth daily with breakfast.     . fexofenadine (ALLEGRA) 180 MG tablet Take 180 mg by mouth every morning.     . fluticasone (FLONASE) 50 MCG/ACT nasal spray Place 2 sprays into the nose every morning.     . furosemide (LASIX) 80 MG tablet Take 1 tablet (80 mg total) by  mouth 2 (two) times daily. 60 tablet 2  . Glucosamine-Chondroit-Vit C-Mn (GLUCOSAMINE CHONDR 1500 COMPLX) CAPS Take 1 capsule by mouth every morning.     . hydrALAZINE (APRESOLINE) 25 MG tablet Take 1 tablet (25 mg total) by mouth 3 (three) times daily. 90 tablet 5  . isosorbide mononitrate (IMDUR) 30 MG 24 hr tablet Take 1 tablet (30 mg total) by mouth daily. 30 tablet 5  . L-Lysine 500 MG CAPS Take 1 capsule by mouth every morning.     . Magnesium Oxide 250 MG TABS Take 2 tablets (500 mg total) by mouth 2 (two) times daily. 120 tablet 6  . meclizine (ANTIVERT) 25 MG tablet Take 25 mg by mouth daily as needed for dizziness.     . montelukast (SINGULAIR) 10 MG tablet Take 1 tablet (10 mg total) by mouth at bedtime. 30 tablet 1  . Multiple Vitamin (MULTIVITAMIN) tablet Take 1 tablet by mouth every morning.     . nitroGLYCERIN (NITROSTAT) 0.4 MG SL tablet Place 1 tablet (0.4 mg total) under the tongue every 5 (five) minutes as needed for chest pain. 30 tablet 3  . ONE TOUCH ULTRA TEST test strip 1 each by Other route every morning.     Letta Pate DELICA LANCETS FINE MISC 1 each by Other route every morning.     . pantoprazole (PROTONIX) 40 MG tablet Take 40 mg by mouth every morning.     Marland Kitchen Phenylephrine-DM-GG-APAP (DELSYM COUGH/COLD DAYTIME PO) Take 10 mLs by mouth 2 (two) times daily as needed (cold).     . potassium chloride SA (K-DUR,KLOR-CON) 20 MEQ tablet Take 2 tablets (40 mEq total) by mouth 2 (two) times daily. 120 tablet 2  . traMADol (ULTRAM) 50 MG tablet 1-2 every 4 hours as needed for cough or pain 40 tablet 0  . warfarin (COUMADIN) 5 MG tablet Take as directed by coumadin clinic (Patient taking differently: Take 5 mg by mouth See admin instructions. Take as directed by coumadin clinic) 120 tablet 0   No current facility-administered medications for this visit.    Allergies:   Acetaminophen-codeine; Amoxicillin; and Diovan    Social History:  The patient  reports that he quit  smoking about 48 years ago. His smoking  use included Cigarettes. He has a 10 pack-year smoking history. He has never used smokeless tobacco. He reports that he drinks alcohol. He reports that he does not use illicit drugs.   Family History:  The patient's family history includes Colon cancer in his mother; Heart attack in his brother and mother; Leukemia in his father.    ROS:   Please see the history of present illness.   Review of Systems  Constitution: Negative for fever.  Cardiovascular: Positive for palpitations.  Respiratory: Positive for wheezing. Negative for cough.   Gastrointestinal: Negative for hematochezia, melena, nausea and vomiting.  Genitourinary: Negative for hematuria.  All other systems reviewed and are negative.    PHYSICAL EXAM: VS:  BP 112/82 mmHg  Pulse 75  Ht 5\' 7"  (1.702 m)  Wt 234 lb (106.142 kg)  BMI 36.64 kg/m2    Wt Readings from Last 3 Encounters:  07/03/14 254 lb (115.214 kg)  07/02/14 254 lb 2 oz (115.27 kg)  06/15/14 252 lb (114.306 kg)     GEN: Well nourished, well developed, in no acute distress HEENT: normal Neck: min JVD at 90 degrees, no masses Cardiac:  Normal S1/S2, RRR; 2/6 systolic murmur RUSB, no rubs or gallops, trace edema  Respiratory:  clear to auscultation bilaterally, no wheezing, rhonchi or rales. GI: soft, nontender, nondistended, + BS MS: no deformity or atrophy Skin: warm and dry  Neuro:  CNs II-XII intact, Strength and sensation are intact Psych: Normal affect   EKG:  EKG is ordered today.  It demonstrates:   NSR, HR 75 , LAD, PRWP, TWI in 1, aVL (tracing done in stress room)   Recent Labs: 07/03/2014: Magnesium 2.0; Pro B Natriuretic peptide (BNP) 1409.0*; TSH 0.941 07/09/2014: BUN 20; Creatinine 1.09; Hemoglobin 11.7*; Platelets 109*; Potassium 3.6; Sodium 137 07/22/2014: ALT 25    Lipid Panel    Component Value Date/Time   CHOL 113 07/22/2014 1226   CHOL 116 07/14/2013 0937   TRIG 91 07/22/2014 1226   TRIG  201.0* 07/14/2013 0937   HDL 40 07/22/2014 1226   HDL 36.10* 07/14/2013 0937   CHOLHDL 3 07/14/2013 0937   VLDL 40.2* 07/14/2013 0937   LDLCALC 55 07/22/2014 1226   LDLDIRECT 47.9 07/14/2013 0937      ASSESSMENT AND PLAN:  Chronic systolic CHF (congestive heart failure):  He has had an eight lb weight gain since DC. He notes some increased abdominal girth.  He takes a large dose of Lasix.  Renal function has been normal.    -  Increase Lasix to 120 mg Twice daily and K+ to 60 mEq Twice daily for 3 days, then resume usual dose.    -  BMET, BNP today.  Repeat BMET 1 week.    -  If no significant improvement, could add 1-2 doses of Metolazone.  Dilated cardiomyopathy:  Occlusion of SVG did not explain drop in EF.      -  Continue Coreg, Hydralazine, Nitrates.      -  Apparently has hx of ACEI cough and unknown reaction to Diovan.  Could consider challenging him with Losartan.    -  Could also consider adding Spironolactone.    -  Plan is to repeat an Echo in May to recheck LVF.  If EF < 35% refer to EP for ICD.    Moderate Aortic Stenosis:  Dobutamine Echo is being done today to assess severity of AS.  Coronary artery disease involving native coronary artery of native heart without angina  pectoris:  No angina.  Continue ASA, statin, beta blocker, nitrates.  Paroxysmal Atrial Fibrillation:  Continue coumadin.  Maintaining NSR.    Essential hypertension:  Controlled.   Dyslipidemia:  Continue statin.  OSA (obstructive sleep apnea):  Continue BiPap.   History of GI bleed 2/2 duodenal polyp:  Recent Hgb's stable.  Pacemaker:  FU with EP as planned.  Current medicines are reviewed at length with the patient today.  The patient does not have concerns regarding medicines.  The following changes have been made:  As above.    Labs/ tests ordered today include:  Orders Placed This Encounter  Procedures  . Basic metabolic panel  . Brain natriuretic peptide  . Basic metabolic panel    . EKG 12-Lead    Disposition:   FU with Dr. Armanda Magic next week as planned.    Signed, Brynda Rim, MHS 08/04/2014 1:32 PM    [facility] [address] [place], [place], [place]  [address] Phone: [phone]; Fax: [phone]

## 2014-08-04 NOTE — Telephone Encounter (Signed)
PT NEEDS TO TALK TO KATIE, RETAINING FLUID, WEIGHT GAIN 7 1/2 LBS IN THE LAST 10 DAYS, PLS ADVISE [phone]

## 2014-08-04 NOTE — Telephone Encounter (Signed)
Patient was discharged from the hopsital recently. Patient st they took 20 pounds of fluid off him in hospital with IV lasix. Patient reports for two weeks after discharge, he remained at 225-226 pounds. On 2/27, patient weighed in at 226.6 pounds. Today, patient weighs 234 pounds. Patient st he has been watching his feet, and they are not swollen.  He reports ABD swelling and SOB in the AM, which resolves throughout the day.   Spoke with Tereso Newcomer (flex) who agrees to see the patient today. Appointment made at 1400. Patient agrees with treatment plan.

## 2014-08-05 ENCOUNTER — Telehealth: Payer: Self-pay | Admitting: *Deleted

## 2014-08-05 ENCOUNTER — Encounter (HOSPITAL_COMMUNITY)
Admission: RE | Admit: 2014-08-05 | Discharge: 2014-08-05 | Disposition: A | Discharge status: Home / Self Care | Payer: Self-pay | Source: Ambulatory Visit | Attending: Cardiology | Admitting: Cardiology

## 2014-08-05 DIAGNOSIS — I5042 Chronic combined systolic (congestive) and diastolic (congestive) heart failure: Secondary | ICD-10-CM

## 2014-08-05 NOTE — Telephone Encounter (Signed)
pt notified of lab results and to stay on the increased dose of lasix and K+ for 1 week instead of 3 days as advised at OV 3/8 w/PA. BMET, BNP 3/16. Pt agreeable to plan of care.

## 2014-08-07 ENCOUNTER — Encounter (HOSPITAL_COMMUNITY)
Admission: RE | Admit: 2014-08-07 | Discharge: 2014-08-07 | Disposition: A | Discharge status: Home / Self Care | Payer: Self-pay | Source: Ambulatory Visit | Attending: Cardiology | Admitting: Cardiology

## 2014-08-07 ENCOUNTER — Ambulatory Visit (INDEPENDENT_AMBULATORY_CARE_PROVIDER_SITE_OTHER): Payer: Medicare Other

## 2014-08-07 DIAGNOSIS — I480 Paroxysmal atrial fibrillation: Secondary | ICD-10-CM

## 2014-08-07 DIAGNOSIS — I4891 Unspecified atrial fibrillation: Secondary | ICD-10-CM

## 2014-08-07 DIAGNOSIS — Z5181 Encounter for therapeutic drug level monitoring: Secondary | ICD-10-CM

## 2014-08-07 LAB — POCT INR: INR: 2

## 2014-08-10 ENCOUNTER — Encounter (HOSPITAL_COMMUNITY)
Admission: RE | Admit: 2014-08-10 | Discharge: 2014-08-10 | Disposition: A | Discharge status: Home / Self Care | Payer: Self-pay | Source: Ambulatory Visit | Attending: Cardiology | Admitting: Cardiology

## 2014-08-11 ENCOUNTER — Other Ambulatory Visit: Payer: Self-pay | Admitting: Adult Health

## 2014-08-11 ENCOUNTER — Other Ambulatory Visit: Payer: Self-pay | Admitting: Internal Medicine

## 2014-08-12 ENCOUNTER — Telehealth: Payer: Self-pay | Admitting: Internal Medicine

## 2014-08-12 ENCOUNTER — Encounter (HOSPITAL_COMMUNITY)
Admission: RE | Admit: 2014-08-12 | Discharge: 2014-08-12 | Disposition: A | Discharge status: Home / Self Care | Payer: Self-pay | Source: Ambulatory Visit | Attending: Cardiology | Admitting: Cardiology

## 2014-08-12 ENCOUNTER — Other Ambulatory Visit (INDEPENDENT_AMBULATORY_CARE_PROVIDER_SITE_OTHER): Payer: Medicare Other | Admitting: *Deleted

## 2014-08-12 DIAGNOSIS — I5042 Chronic combined systolic (congestive) and diastolic (congestive) heart failure: Secondary | ICD-10-CM

## 2014-08-12 LAB — BASIC METABOLIC PANEL
BUN: 24 mg/dL — AB (ref 6–23)
CHLORIDE: 102 meq/L (ref 96–112)
CO2: 30 mEq/L (ref 19–32)
Calcium: 9 mg/dL (ref 8.4–10.5)
Creatinine, Ser: 1.32 mg/dL (ref 0.40–1.50)
GFR: 56.41 mL/min — ABNORMAL LOW (ref >60.00)
Glucose, Bld: 104 mg/dL — ABNORMAL HIGH (ref 70–99)
POTASSIUM: 4.1 meq/L (ref 3.5–5.1)
SODIUM: 138 meq/L (ref 135–145)

## 2014-08-12 LAB — BRAIN NATRIURETIC PEPTIDE: Pro B Natriuretic peptide (BNP): 1197 pg/mL — ABNORMAL HIGH (ref 0.0–100.0)

## 2014-08-12 MED ORDER — TRAMADOL HCL 50 MG PO TABS: ORAL_TABLET | ORAL | Status: DC

## 2014-08-12 NOTE — Telephone Encounter (Signed)
Ok to refill x 1  

## 2014-08-12 NOTE — Telephone Encounter (Signed)
Rx has been called in  

## 2014-08-12 NOTE — Telephone Encounter (Signed)
Received refill request for Tramadol 50mg  1-2 tabs q4h prn. This was last refilled on 07/03/14 for #40 with 0 refills when pt saw TP. Pt has an appt recall in system for f/u in April with PFT with MW.   Dr. Sherene Sires please advise on refill. Thanks.   Allergies  Allergen Reactions  . Acetaminophen-Codeine Other (See Comments)    Extreme constipation   . Amoxicillin Itching and Rash    Unknown-maybe a rash?  . Diovan [Valsartan] Other (See Comments)    Unknown

## 2014-08-14 ENCOUNTER — Ambulatory Visit (INDEPENDENT_AMBULATORY_CARE_PROVIDER_SITE_OTHER): Payer: Medicare Other | Admitting: Cardiology

## 2014-08-14 ENCOUNTER — Encounter (HOSPITAL_COMMUNITY)
Admission: RE | Admit: 2014-08-14 | Discharge: 2014-08-14 | Disposition: A | Discharge status: Home / Self Care | Payer: Self-pay | Source: Ambulatory Visit | Attending: Cardiology | Admitting: Cardiology

## 2014-08-14 ENCOUNTER — Encounter: Payer: Self-pay | Admitting: Cardiology

## 2014-08-14 VITALS — BP 114/72 | HR 79 | Ht 67.0 in | Wt 244.0 lb

## 2014-08-14 DIAGNOSIS — Z9861 Coronary angioplasty status: Secondary | ICD-10-CM

## 2014-08-14 DIAGNOSIS — R0602 Shortness of breath: Secondary | ICD-10-CM

## 2014-08-14 DIAGNOSIS — I350 Nonrheumatic aortic (valve) stenosis: Secondary | ICD-10-CM

## 2014-08-14 DIAGNOSIS — I10 Essential (primary) hypertension: Secondary | ICD-10-CM

## 2014-08-14 DIAGNOSIS — I480 Paroxysmal atrial fibrillation: Secondary | ICD-10-CM

## 2014-08-14 DIAGNOSIS — G4733 Obstructive sleep apnea (adult) (pediatric): Secondary | ICD-10-CM

## 2014-08-14 DIAGNOSIS — I5022 Chronic systolic (congestive) heart failure: Secondary | ICD-10-CM

## 2014-08-14 DIAGNOSIS — I2510 Atherosclerotic heart disease of native coronary artery without angina pectoris: Secondary | ICD-10-CM

## 2014-08-14 DIAGNOSIS — I493 Ventricular premature depolarization: Secondary | ICD-10-CM

## 2014-08-14 DIAGNOSIS — I5042 Chronic combined systolic (congestive) and diastolic (congestive) heart failure: Secondary | ICD-10-CM

## 2014-08-14 MED ORDER — SPIRONOLACTONE 25 MG PO TABS: 25.0000 mg | ORAL_TABLET | Freq: Every day | ORAL | Status: DC

## 2014-08-14 MED ORDER — FUROSEMIDE 20 MG PO TABS: 20.0000 mg | ORAL_TABLET | Freq: Every day | ORAL | Status: DC

## 2014-08-14 NOTE — Patient Instructions (Signed)
Your physician has recommended you make the following change in your medication:  1) INCREASE LASIX to 100 mg TWICE DAILY 2) START ALDACTONE 25 mg daily  Your physician has requested that you have an echocardiogram IN 2 MONTHS. Echocardiography is a painless test that uses sound waves to create images of your heart. It provides your doctor with information about the size and shape of your heart and how well your heart's chambers and valves are working. This procedure takes approximately one hour. There are no restrictions for this procedure.  Your physician has requested that you have a TEE on April 4. During a TEE, sound waves are used to create images of your heart. It provides your doctor with information about the size and shape of your heart and how well your heart's chambers and valves are working. In this test, a transducer is attached to the end of a flexible tube that's guided down your throat and into your esophagus (the tube leading from you mouth to your stomach) to get a more detailed image of your heart. You are not awake for the procedure. Please see the instruction sheet given to you today. For further information please visit [email or web address]  Your physician recommends that you schedule a follow-up appointment in: 1 WEEK with a PA or NP  Your physician recommends that you return for lab work THE SAME DAY AS YOUR APPOINTMENT WITH PA (BMET).  Your physician recommends that you schedule a follow-up appointment in: 1 month with Dr. [name].

## 2014-08-14 NOTE — Progress Notes (Signed)
Cardiology Office Note   Date:  08/16/2014   ID:  Brandon Klein, DOB 07/25/1940, MRN [id number]  PCP:  Gaye Alken, MD  Cardiologist:   [name], MD   No chief complaint on file.     History of Present Illness: Brandon Klein is a 74 y.o. male with a hx of CAD, status post prior stenting to the LAD and balloon angioplasty to the circumflex, paroxysmal atrial fibrillation, bradycardia s/p PPM, aortic stenosis, HTN, HL.   He was recently seen with exertional chest discomfort and set up for stress testing. This was high risk and he was set up for cardiac catheterization. This demonstrated 3 vessel CAD and he was referred for CABG. Patient was admitted 6/30-7/10. He underwent CABG with Dr. Donata Clay (LIMA-LAD, SVG-diagonal, SVG-circumflex, SVG-PDA). He developed postoperative atrial fibrillation and was placed on amiodarone. He has normal LVF by echo 10/2013. Patient had volume overload and was sent home on Lasix. His weight came down nicely. However, after stopping this, his weight started to increase again. He was given another week of Lasix and then stopped it. He started to have fluid buildup again and is now on chronic Lasix.  He was set up for sleep study which showed severe OSA and underwent CPAP titration but could not be adequately titrated and was set up for BIPAP titration.    He recently had some issues with GI bleeding and underwent endoscopy by Dr. 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 He was subsequently referred to Dr. Josetta Huddle at [facility] for duodenal bulb polypectomy and underwent this procedure uneventfully and lesion was benign.   He was referred to Dr. Sherene Sires last November for SOB and cough associated with wheezing, scratchy nose and throat and watery eyes. Dr. Sherene Sires felt that he had GERD causing his cough but was concerned about O2 desats to 87% on RA with ambulation and he  ordered a BNP as well as a 2D echo which showed significantly reduced LVF with EF 20%. He Lopressor was stopped and he was started on Coreg/Hydralazine and Imdur and presented back today for followup with continued SOB and wheezing as well as having persistent LE edema. His Lasix was initially increased but then decreased due to worsening renal insuff. His coreg was titrated.  He underwent right and left heart cath which showed severe 3 vessel ASCAD with 3/4 grafts patent (SVG to PDA ocluded) and SVG to OM with 90% stenosis prox to the graft insertion, ischemic DCM and moderate AS.  PCW was 18.  There was some concern that his AS was underestimated due to LV dysfunction and he underwent dobutamine echo.  The aortic valve peak velocity increased from 2.74m/sec to 3.29 m/sec at peak dobutamine infusion. The mean AV gradient increased from to 37.34mmHg at peak dobutamine infusion. The AVA was calculated anywhere from 1.4cm2 at baselineto 1.15cm2 at mid dose and 1.4cm2 at high dose. All data consistent with moderate aortic stenosis.    Past Medical History  Diagnosis Date  . Syncope     a. vasovagal with documented bradycardia.  . Pacemaker     a. 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 b. MDT generator replacement 2012.  . Obesity   . HTN (hypertension)   . HLD (hyperlipidemia)   . DJD (degenerative joint disease)   . GERD (gastroesophageal reflux disease)   . Impaired fasting glucose  A1C check every year  . PAF (paroxysmal atrial fibrillation)     on chronic systemic anticoagulation  . Coronary artery disease     a. s/p PCI of LAD 1997. b. Cutting balloon angioplasty to LCx in 2002. c. s/p cath 11/2013 with severe 3 vessel ASCAD s/p CABG with LIMA to LAD, SVG to diag, SVG to left circ and SVG to PDA. d. cath 07/07/2014 occluded SVG to PDA, all other grafts patent. 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 Postoperative atrial fibrillation   . AS (aortic stenosis)     mild by  echo 10/2013  . Hypokalemia   . Hypomagnesemia   . Carotid artery plaque     a. Duplex 10/2013: mild calcific plaque origin ICA. Left: intimal wall thickening CCA. 0-39% BICA.  Marland Kitchen PVC's (premature ventricular contractions)   . OSA (obstructive sleep apnea)   . Chronic diastolic CHF (congestive heart failure)   . GI bleeding     a.  with benign gastric polypectomy 05/2014.  Marland Kitchen Epistaxis     Past Surgical History  Procedure Laterality Date  . Cholecystectomy    . Knee arthroscopy  2000    Left  . Angioplasty  1997  . Pacemaker insertion  2002  . Coronary artery bypass graft N/A 12/01/2013    Procedure: CORONARY ARTERY BYPASS GRAFT TIMES FOUR USING LEFT INTERNAL MAMMARY ARTERY TO LAD, SAPHENOUS VEIN GRAFTS TO DIAGONAL, CIRCUMFELX AND POSTERIOR DESCENDING;  Surgeon: Kerin Perna, MD;  Location: [facility] OR;  Service: Open Heart Surgery;  Laterality: N/A;  . Esophagogastroduodenoscopy (egd) with propofol N/A 05/05/2014    Procedure: ESOPHAGOGASTRODUODENOSCOPY (EGD) WITH PROPOFOL;  Surgeon: Charolett Bumpers, MD;  Location: [facility] ENDOSCOPY;  Service: Endoscopy;  Laterality: N/A;  . Colonoscopy with propofol N/A 05/05/2014    Procedure: COLONOSCOPY WITH PROPOFOL;  Surgeon: Charolett Bumpers, MD;  Location: [facility] ENDOSCOPY;  Service: Endoscopy;  Laterality: N/A;  . Left heart catheterization with coronary angiogram N/A 11/25/2013    Procedure: LEFT HEART CATHETERIZATION WITH CORONARY ANGIOGRAM;  Surgeon: [name], MD;  Location: [facility] CATH LAB;  Service: Cardiovascular;  Laterality: N/A;  . Left and right heart catheterization with coronary/graft angiogram N/A 07/07/2014    Procedure: LEFT AND RIGHT HEART CATHETERIZATION WITH Isabel Caprice;  Surgeon: Kathleene Hazel, MD;  Location: [facility] CATH LAB;  Service: Cardiovascular;  Laterality: N/A;     Current Outpatient Prescriptions  Medication Sig Dispense Refill  . aspirin EC 81 MG EC tablet Take 1 tablet (81 mg total) by mouth daily. (Patient  taking differently: Take 81 mg by mouth every morning. )    . atorvastatin (LIPITOR) 20 MG tablet Take 20 mg by mouth daily at 6 PM.     . carvedilol (COREG) 25 MG tablet Take 1 tablet (25 mg total) by mouth 2 (two) times daily with a meal. 60 tablet 5  . Cholecalciferol (VITAMIN D) 2000 UNITS tablet Take 2,000 Units by mouth daily.    . Cyanocobalamin (VITAMIN B-12 PO) Take 1 tablet by mouth every morning.    . docusate sodium (COLACE) 100 MG capsule Take 200 mg by mouth every morning.     . ferrous sulfate 325 (65 FE) MG tablet Take 325 mg by mouth daily with breakfast.     . fexofenadine (ALLEGRA) 180 MG tablet Take 180 mg by mouth every morning.     . fluticasone (FLONASE) 50 MCG/ACT nasal spray Place 2 sprays into the nose every morning.     . furosemide (  LASIX) 80 MG tablet Increase to 1 and 1/2 tablets (120 mg) twice a day for 3 days.  Then resume 1 tablet twice daily. 60 tablet 2  . Glucosamine-Chondroit-Vit C-Mn (GLUCOSAMINE CHONDR 1500 COMPLX) CAPS Take 1 capsule by mouth every morning.     . hydrALAZINE (APRESOLINE) 25 MG tablet Take 1 tablet (25 mg total) by mouth 3 (three) times daily. 90 tablet 5  . isosorbide mononitrate (IMDUR) 30 MG 24 hr tablet Take 1 tablet (30 mg total) by mouth daily. 30 tablet 5  . L-Lysine 500 MG CAPS Take 1 capsule by mouth every morning.     . Magnesium Oxide 250 MG TABS Take 2 tablets (500 mg total) by mouth 2 (two) times daily. 120 tablet 6  . meclizine (ANTIVERT) 25 MG tablet Take 25 mg by mouth daily as needed for dizziness.     . montelukast (SINGULAIR) 10 MG tablet take 1 tablet by mouth at bedtime 30 tablet 1  . Multiple Vitamin (MULTIVITAMIN) tablet Take 1 tablet by mouth every morning.     . nitroGLYCERIN (NITROSTAT) 0.4 MG SL tablet Place 1 tablet (0.4 mg total) under the tongue every 5 (five) minutes as needed for chest pain. 30 tablet 3  . ONE TOUCH ULTRA TEST test strip 1 each by Other route every morning.     Letta Pate DELICA LANCETS FINE  MISC 1 each by Other route every morning.     . pantoprazole (PROTONIX) 40 MG tablet Take 40 mg by mouth every morning.     Marland Kitchen Phenylephrine-DM-GG-APAP (DELSYM COUGH/COLD DAYTIME PO) Take 10 mLs by mouth 2 (two) times daily as needed (cold).     . potassium chloride SA (K-DUR,KLOR-CON) 20 MEQ tablet Increase to 3 tablets (60 mEq) twice daily for 3 days.  Then resume 2 tablets (40 mEq) twice daily. 120 tablet 2  . traMADol (ULTRAM) 50 MG tablet 1-2 every 4 hours as needed for cough or pain 40 tablet 0  . warfarin (COUMADIN) 5 MG tablet Take as directed by coumadin clinic (Patient taking differently: Take 5 mg by mouth See admin instructions. Take as directed by coumadin clinic) 120 tablet 0  . furosemide (LASIX) 20 MG tablet Take 1 tablet (20 mg total) by mouth daily. 60 tablet 3  . spironolactone (ALDACTONE) 25 MG tablet Take 1 tablet (25 mg total) by mouth daily. 90 tablet 3   No current facility-administered medications for this visit.    Allergies:   Acetaminophen-codeine; Amoxicillin; and Diovan    Social History:  The patient  reports that he quit smoking about 48 years ago. His smoking use included Cigarettes. He has a 10 pack-year smoking history. He has never used smokeless tobacco. He reports that he drinks alcohol. He reports that he does not use illicit drugs.   Family History:  The patient's family history includes Colon cancer in his mother; Heart attack in his brother and mother; Leukemia in his father.    ROS:  Please see the history of present illness.   Otherwise, review of systems are positive for none.   All other systems are reviewed and negative.    PHYSICAL EXAM: VS:  BP 114/72 mmHg  Pulse 79  Ht  (1.702 m)  Wt 244 lb (110.678 kg)  BMI 38.21 kg/m2 , BMI Body mass index is 38.21 kg/(m^2). GEN: Well nourished, well developed, in no acute distress HEENT: normal Neck: no JVD, carotid bruits, or masses Cardiac: RRR; no rubs, or gallops,no edema with 2/6  SM at RUSB  to LLSB Respiratory:  clear to auscultation bilaterally, normal work of breathing GI: soft, nontender, nondistended, + BS MS: no deformity or atrophy Skin: warm and dry, no rash Neuro:  Strength and sensation are intact Psych: euthymic mood, full affect   EKG:  EKG is not ordered today.    Recent Labs: 07/03/2014: Magnesium 2.0; TSH 0.941 07/09/2014: Hemoglobin 11.7*; Platelets 109* 07/22/2014: ALT 25 08/12/2014: BUN 24*; Creatinine 1.32; Potassium 4.1; Pro B Natriuretic peptide (BNP) 1197.0*; Sodium 138    Lipid Panel    Component Value Date/Time   CHOL 113 07/22/2014 1226   CHOL 116 07/14/2013 0937   TRIG 91 07/22/2014 1226   TRIG 201.0* 07/14/2013 0937   HDL 40 07/22/2014 1226   HDL 36.10* 07/14/2013 0937   CHOLHDL 3 07/14/2013 0937   VLDL 40.2* 07/14/2013 0937   LDLCALC 55 07/22/2014 1226   LDLDIRECT 47.9 07/14/2013 0937      Wt Readings from Last 3 Encounters:  08/14/14 244 lb (110.678 kg)  08/04/14 234 lb (106.142 kg)  07/03/14 254 lb (115.214 kg)      ASSESSMENT AND PLAN:  1. Shortness of breath - I suspect that his dyspnea is multifactorial from underlying GERD, CHF with worsening LVF and anemia. His lungs are clear on exam today but still has LE edema.    Hbg has been stable.    2. Chronic combined systolic/diastolic CHF class II: right and left heart cath showed  severe 3 vessel ASCAD with 3/4 grafts patent (SVG to PDA ocluded) and SVG to OM with 90% stenosis prox to the graft insertion, ischemic DCM and moderate AS.  PCW was 18.  Continue BB/Hydralazine/nitrates. Not on ACE I due to renal insuff. Increase Lasix to  BID and add aldactone  daily.  Check BMET in 1 week.  Repeat echo in 2 months on max med therapy and if still reduced refer to EP for possible upgrade to AICD.    3. CAD s/p CABG 12/02/13 with 3/4 grafts patent by recent cath (occluded SVG to PDA and SVG to OM patent with 90% stenosis proximal to the graft insertion in the OM with  patent flow distally) :Continue aspirin, statin, beta blocker.   4. PAF (paroxysmal atrial fibrillation): Maintaining NSR. Continue Coumadin/BB.   5. Mild to moderate aortic stenosis by recent echo - Mean gradient was and prior to CABG was . AS by recent cath was moderate with peak to peak of .  Dobutamine echo showed moderate AS with aortic valve peak velocity increased from 2.48m/sec to 3.29 m/sec at peak dobutamine infusion. The mean AV gradient increased from to 37.61mmHg at peak dobutamine infusion. The AVA was calculated anywhere from 1.4cm2 at baselineto 1.15cm2 at mid dose and 1.4cm2 at high dose. All data consistent with at least moderate aortic stenosis but mean gradient at peak dobutamine nearing severe range.  I have recommended that we proceed with TEE to get a planimetered valve area since we have no etiology for decline in LVF (out of proportion to CAD and normal prior to CABG).  6. Essential hypertension: controlled. Continue BB/Hydralazine  7. Dyslipidemia: LDL at goal at 42. Continue statin.   8. Bradycardia s/p MDT pacemaker: Follow up with the EP as planned.   9. OSA s/p BiPAP titration to 20/16cm H2O      Current medicines are reviewed at length with the patient today.  The patient does not have concerns regarding medicines.  The following changes have been  made:  Increase Lasix to 100mg  BID, add aldactone 25mg  daily  Labs/ tests ordered today include: echo in 2 months, TEE, BMET in 1 week   Orders Placed This Encounter  Procedures  . Basic Metabolic Panel (BMET)  . 2D Echocardiogram without contrast     Disposition:   FU with me in 1 week   Signed, [name], MD  08/16/2014 3:23 PM    [facility] [address] [place], [place], [place]  [address] Phone: [phone]; Fax: [phone]

## 2014-08-17 ENCOUNTER — Telehealth: Payer: Self-pay | Admitting: Physician Assistant

## 2014-08-17 ENCOUNTER — Encounter (HOSPITAL_COMMUNITY)
Admission: RE | Admit: 2014-08-17 | Discharge: 2014-08-17 | Disposition: A | Discharge status: Home / Self Care | Payer: Self-pay | Source: Ambulatory Visit | Attending: Cardiology | Admitting: Cardiology

## 2014-08-17 NOTE — Addendum Note (Signed)
Addended by: Boone Master E on: 08/17/2014 03:49 PM   Modules accepted: Orders

## 2014-08-17 NOTE — Telephone Encounter (Signed)
Follow Up         Pt returning Carol's phone call. Please call back and advise.

## 2014-08-17 NOTE — Telephone Encounter (Signed)
Verified appointment time with patient March 29 and informed him he will have pre-procedure lab work done that day.  Informed patient he will receive instruction letter when he comes to his OV. Patient agrees with treatment plan.

## 2014-08-17 NOTE — Telephone Encounter (Signed)
ptcb today and said he was returning my calls from last week. I said yes that I was calling with lab results and when he was here in the office and saw Dr. [name] which Dr. Norris Cross RN said Dr. [name] will go over the results with him since he was here.

## 2014-08-19 ENCOUNTER — Encounter (HOSPITAL_COMMUNITY)
Admission: RE | Admit: 2014-08-19 | Discharge: 2014-08-19 | Disposition: A | Discharge status: Home / Self Care | Payer: Self-pay | Source: Ambulatory Visit | Attending: Cardiology | Admitting: Cardiology

## 2014-08-21 ENCOUNTER — Encounter (HOSPITAL_COMMUNITY)
Admission: RE | Admit: 2014-08-21 | Discharge: 2014-08-21 | Disposition: A | Discharge status: Home / Self Care | Payer: Self-pay | Source: Ambulatory Visit | Attending: Cardiology | Admitting: Cardiology

## 2014-08-24 ENCOUNTER — Encounter (HOSPITAL_COMMUNITY)
Admission: RE | Admit: 2014-08-24 | Discharge: 2014-08-24 | Disposition: A | Discharge status: Home / Self Care | Payer: Self-pay | Source: Ambulatory Visit | Attending: Cardiology | Admitting: Cardiology

## 2014-08-24 NOTE — Progress Notes (Signed)
Cardiology Office Note   Date:  08/25/2014   ID:  Brandon Klein, DOB 1940-07-14, MRN [id number]  PCP:  Gaye Alken, MD  Cardiologist:   [name], MD   Chief Complaint  Patient presents with  . Coronary Artery Disease  . Hypertension  . Congestive Heart Failure      History of Present Illness: Brandon Klein is a 74 y.o. male with a hx of CAD, status post prior stenting to the LAD and balloon angioplasty to the circumflex, cath 10/2013 with severe 3 vessel ASCAD s/p CABG (LIMA-LAD, SVG to diag,SVG to circ, SVG to PDA) with normal LVF at time of cath, paroxysmal atrial fibrillation, bradycardia s/p PPM, moderate to severe aortic stenosis by recent dobutamine echo, HTN, HL.  He developed acute CHF about 6 months after CABG and was found to have severe LV dysfunction.  Cath 07/07/2014 showed occluded SVG to PDA, all other grafts patent. EF down felt secondary to  NICM but there has been some concern regarding possible worsening AS.  There was some concern that his AS was underestimated due to LV dysfunction and he underwent dobutamine echo. The aortic valve peak velocity increased from 2.28m/sec to 3.29 m/sec at peak dobutamine infusion. The mean AV gradient increased from to 37.80mmHg at peak dobutamine infusion. The AVA was calculated anywhere from 1.4cm2 at baselineto 1.15cm2 at mid dose and 1.4cm2 at high dose. All data consistent with at least moderate aortic stenosis.  He has been set up for TEE to get a planimetered valve area.    When I saw him a few weeks ago he was still having some problems with volume overload and his Lasix was increased to  BID and aldactone was added.  He presents today for followup after increasing diuretics.  He is feeling better.  He has lost 14 pounds since I saw him last.  He says that his breathing is doing well and is not really SOB.  He denies any LE edema.  He still thinks he has some mild fluid in his abdomen.  He denies any  dizziness or syncope.  He denies any palpitations.    Past Medical History  Diagnosis Date  . Syncope     a. vasovagal with documented bradycardia.  . Pacemaker     a. 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 b. MDT generator replacement 2012.  . Obesity   . HTN (hypertension)   . HLD (hyperlipidemia)   . DJD (degenerative joint disease)   . GERD (gastroesophageal reflux disease)   . Impaired fasting glucose     A1C check every year  . PAF (paroxysmal atrial fibrillation)     on chronic systemic anticoagulation  . Coronary artery disease     a. s/p PCI of LAD 1997. b. Cutting balloon angioplasty to LCx in 2002. c. s/p cath 11/2013 with severe 3 vessel ASCAD s/p CABG with LIMA to LAD, SVG to diag, SVG to left circ and SVG to PDA. d. cath 07/07/2014 occluded SVG to PDA, all other grafts patent. 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 Postoperative atrial fibrillation   . AS (aortic stenosis)     mild by echo 10/2013  . Hypokalemia   . Hypomagnesemia   . Carotid artery plaque     a. Duplex 10/2013: mild calcific plaque origin ICA. Left: intimal wall thickening CCA. 0-39% BICA.  Marland Kitchen PVC's (premature ventricular contractions)   . OSA (obstructive sleep apnea)   . Chronic  diastolic CHF (congestive heart failure)   . GI bleeding     a.  with benign gastric polypectomy 05/2014.  Marland Kitchen Epistaxis     Past Surgical History  Procedure Laterality Date  . Cholecystectomy    . Knee arthroscopy  2000    Left  . Angioplasty  1997  . Pacemaker insertion  2002  . Coronary artery bypass graft N/A 12/01/2013    Procedure: CORONARY ARTERY BYPASS GRAFT TIMES FOUR USING LEFT INTERNAL MAMMARY ARTERY TO LAD, SAPHENOUS VEIN GRAFTS TO DIAGONAL, CIRCUMFELX AND POSTERIOR DESCENDING;  Surgeon: Kerin Perna, MD;  Location: [facility] OR;  Service: Open Heart Surgery;  Laterality: N/A;  . Esophagogastroduodenoscopy (egd) with propofol N/A 05/05/2014    Procedure: ESOPHAGOGASTRODUODENOSCOPY (EGD) WITH PROPOFOL;   Surgeon: Charolett Bumpers, MD;  Location: [facility] ENDOSCOPY;  Service: Endoscopy;  Laterality: N/A;  . Colonoscopy with propofol N/A 05/05/2014    Procedure: COLONOSCOPY WITH PROPOFOL;  Surgeon: Charolett Bumpers, MD;  Location: [facility] ENDOSCOPY;  Service: Endoscopy;  Laterality: N/A;  . Left heart catheterization with coronary angiogram N/A 11/25/2013    Procedure: LEFT HEART CATHETERIZATION WITH CORONARY ANGIOGRAM;  Surgeon: [name], MD;  Location: [facility] CATH LAB;  Service: Cardiovascular;  Laterality: N/A;  . Left and right heart catheterization with coronary/graft angiogram N/A 07/07/2014    Procedure: LEFT AND RIGHT HEART CATHETERIZATION WITH Isabel Caprice;  Surgeon: Kathleene Hazel, MD;  Location: [facility] CATH LAB;  Service: Cardiovascular;  Laterality: N/A;     Current Outpatient Prescriptions  Medication Sig Dispense Refill  . aspirin EC 81 MG EC tablet Take 1 tablet (81 mg total) by mouth daily. (Patient taking differently: Take 81 mg by mouth every morning. )    . atorvastatin (LIPITOR) 20 MG tablet Take 20 mg by mouth daily at 6 PM.     . carvedilol (COREG) 25 MG tablet Take 1 tablet (25 mg total) by mouth 2 (two) times daily with a meal. 60 tablet 5  . Cholecalciferol (VITAMIN D) 2000 UNITS tablet Take 2,000 Units by mouth daily.    . Cyanocobalamin (VITAMIN B-12 PO) Take 1 tablet by mouth every morning.    . docusate sodium (COLACE) 100 MG capsule Take 200 mg by mouth every morning.     . ferrous sulfate 325 (65 FE) MG tablet Take 325 mg by mouth daily with breakfast.     . fexofenadine (ALLEGRA) 180 MG tablet Take 180 mg by mouth every morning.     . fluticasone (FLONASE) 50 MCG/ACT nasal spray Place 2 sprays into the nose every morning.     . furosemide (LASIX) 20 MG tablet Take 1 tablet (20 mg total) by mouth daily. (Patient taking differently: Take 20 mg by mouth 2 (two) times daily. ) 60 tablet 3  . furosemide (LASIX) 80 MG tablet Increase to 1 and 1/2 tablets (120 mg)  twice a day for 3 days.  Then resume 1 tablet twice daily. 60 tablet 2  . Glucosamine-Chondroit-Vit C-Mn (GLUCOSAMINE CHONDR 1500 COMPLX) CAPS Take 1 capsule by mouth every morning.     . hydrALAZINE (APRESOLINE) 25 MG tablet Take 1 tablet (25 mg total) by mouth 3 (three) times daily. 90 tablet 5  . isosorbide mononitrate (IMDUR) 30 MG 24 hr tablet Take 1 tablet (30 mg total) by mouth daily. 30 tablet 5  . L-Lysine 500 MG CAPS Take 1 capsule by mouth every morning.     . Magnesium Oxide 250 MG TABS Take 2 tablets (500 mg  total) by mouth 2 (two) times daily. 120 tablet 6  . meclizine (ANTIVERT) 25 MG tablet Take 25 mg by mouth daily as needed for dizziness.     . montelukast (SINGULAIR) 10 MG tablet take 1 tablet by mouth at bedtime 30 tablet 1  . Multiple Vitamin (MULTIVITAMIN) tablet Take 1 tablet by mouth every morning.     . nitroGLYCERIN (NITROSTAT) 0.4 MG SL tablet Place 1 tablet (0.4 mg total) under the tongue every 5 (five) minutes as needed for chest pain. 30 tablet 3  . ONE TOUCH ULTRA TEST test strip 1 each by Other route every morning.     Letta Pate DELICA LANCETS FINE MISC 1 each by Other route every morning.     . pantoprazole (PROTONIX) 40 MG tablet Take 40 mg by mouth every morning.     Marland Kitchen Phenylephrine-DM-GG-APAP (DELSYM COUGH/COLD DAYTIME PO) Take 10 mLs by mouth 2 (two) times daily as needed (cold).     . potassium chloride SA (K-DUR,KLOR-CON) 20 MEQ tablet Increase to 3 tablets (60 mEq) twice daily for 3 days.  Then resume 2 tablets (40 mEq) twice daily. 120 tablet 2  . spironolactone (ALDACTONE) 25 MG tablet Take 1 tablet (25 mg total) by mouth daily. 90 tablet 3  . traMADol (ULTRAM) 50 MG tablet 1-2 every 4 hours as needed for cough or pain 40 tablet 0  . warfarin (COUMADIN) 5 MG tablet Take as directed by coumadin clinic (Patient taking differently: Take 5 mg by mouth See admin instructions. Take as directed by coumadin clinic) 120 tablet 0   No current facility-administered  medications for this visit.    Allergies:   Acetaminophen-codeine; Amoxicillin; and Diovan    Social History:  The patient  reports that he quit smoking about 48 years ago. His smoking use included Cigarettes. He has a 10 pack-year smoking history. He has never used smokeless tobacco. He reports that he drinks alcohol. He reports that he does not use illicit drugs.   Family History:  The patient's family history includes Colon cancer in his mother; Heart attack in his brother and mother; Leukemia in his father.    ROS:  Please see the history of present illness.   Otherwise, review of systems are positive for none.   All other systems are reviewed and negative.    PHYSICAL EXAM: VS:  BP 90/58 mmHg  Pulse 67  Ht  (1.702 m)  Wt 230 lb 3.2 oz (104.418 kg)  BMI 36.05 kg/m2  SpO2 96% , BMI Body mass index is 36.05 kg/(m^2). GEN: Well nourished, well developed, in no acute distress HEENT: normal Neck: no JVD, carotid bruits, or masses Cardiac: RRR; no murmurs, rubs, or gallops,no edema  Respiratory:  clear to auscultation bilaterally, normal work of breathing GI: soft, nontender, nondistended, + BS MS: no deformity or atrophy Skin: warm and dry, no rash Neuro:  Strength and sensation are intact Psych: euthymic mood, full affect   EKG:  EKG is not ordered today.    Recent Labs: 07/03/2014: Magnesium 2.0; TSH 0.941 07/09/2014: Hemoglobin 11.7*; Platelets 109* 07/22/2014: ALT 25 08/12/2014: BUN 24*; Creatinine 1.32; Potassium 4.1; Pro B Natriuretic peptide (BNP) 1197.0*; Sodium 138    Lipid Panel    Component Value Date/Time   CHOL 113 07/22/2014 1226   CHOL 116 07/14/2013 0937   TRIG 91 07/22/2014 1226   TRIG 201.0* 07/14/2013 0937   HDL 40 07/22/2014 1226   HDL 36.10* 07/14/2013 0937   CHOLHDL 3 07/14/2013  7425   VLDL 40.2* 07/14/2013 0937   LDLCALC 55 07/22/2014 1226   LDLDIRECT 47.9 07/14/2013 0937      Wt Readings from Last 3 Encounters:  08/25/14 230 lb 3.2  oz (104.418 kg)  08/14/14 244 lb (110.678 kg)  08/04/14 234 lb (106.142 kg)    ASSESSMENT AND PLAN:  1. Shortness of breath - I suspect that his dyspnea is multifactorial from underlying GERD, CHF with worsening LVF and anemia. His lungs are clear on exam today but still has LE edema. Hbg has been stable.   2. Chronic combined systolic/diastolic CHF class II: right and left heart cath showed severe 3 vessel ASCAD with 3/4 grafts patent (SVG to PDA ocluded) and SVG to OM with 90% stenosis prox to the graft insertion, ischemic DCM and moderate AS. PCW was 18. Continue BB/Hydralazine/nitrates/diuetics. Not on ACE I due to renal insuff.  Repeat echo in 2 months on max med therapy and if still reduced refer to EP for possible upgrade to AICD. He appears euvolemic on exam today  3. CAD s/p CABG 12/02/13 with 3/4 grafts patent by recent cath (occluded SVG to PDA and SVG to OM patent with 90% stenosis proximal to the graft insertion in the OM with patent flow distally) :Continue aspirin, statin, beta blocker.  4. PAF (paroxysmal atrial fibrillation): Maintaining NSR. Continue Coumadin/BB.   5. Mild to moderate aortic stenosis by recent echo - Mean gradient was and prior to CABG was . AS by recent cath was moderate with peak to peak of . Dobutamine echo showed moderate AS with aortic valve peak velocity increased from 2.18m/sec to 3.29 m/sec at peak dobutamine infusion. The mean AV gradient increased from to 37.84mmHg at peak dobutamine infusion. The AVA was calculated anywhere from 1.4cm2 at baselineto 1.15cm2 at mid dose and 1.4cm2 at high dose. All data consistent with at least moderate aortic stenosis but mean gradient at peak dobutamine nearing severe range. I have recommended that we proceed with TEE to get a planimetered valve area since we have no etiology for decline in LVF (out of proportion to CAD and normal prior to CABG). He is scheduled next  month.  6. Essential hypertension: controlled. Continue BB/Hydralazine.  His BP is on the low side today and runs in the low 100's to 90's in cardiac rehab.  I have asked him to decrease Coreg to 12.5mg  BID.   7. Dyslipidemia: LDL at goal at 42. Continue statin.   8. Bradycardia s/p MDT pacemaker: Follow up with the EP as planned.   9. OSA s/p BiPAP titration to 20/16cm H2O      Current medicines are reviewed at length with the patient today.  The patient does not have concerns regarding medicines.  The following changes have been made:  Decrease Coreg to 12.5mg  BID  Labs/ tests ordered today include: BMET  No orders of the defined types were placed in this encounter.     Disposition:   FU with me in 6 months   Signed, [name], MD  08/25/2014 11:17 AM    [facility] [address] [place], [place], [place]  [address] Phone: [phone]; Fax: [phone]

## 2014-08-25 ENCOUNTER — Ambulatory Visit (INDEPENDENT_AMBULATORY_CARE_PROVIDER_SITE_OTHER): Payer: Medicare Other | Admitting: Cardiology

## 2014-08-25 ENCOUNTER — Other Ambulatory Visit: Payer: Medicare Other

## 2014-08-25 ENCOUNTER — Encounter: Payer: Self-pay | Admitting: Cardiology

## 2014-08-25 VITALS — BP 90/58 | HR 67 | Ht 67.0 in | Wt 230.2 lb

## 2014-08-25 DIAGNOSIS — I2510 Atherosclerotic heart disease of native coronary artery without angina pectoris: Secondary | ICD-10-CM

## 2014-08-25 DIAGNOSIS — Z9861 Coronary angioplasty status: Secondary | ICD-10-CM

## 2014-08-25 DIAGNOSIS — Z01812 Encounter for preprocedural laboratory examination: Secondary | ICD-10-CM | POA: Diagnosis not present

## 2014-08-25 DIAGNOSIS — I10 Essential (primary) hypertension: Secondary | ICD-10-CM

## 2014-08-25 DIAGNOSIS — I350 Nonrheumatic aortic (valve) stenosis: Secondary | ICD-10-CM

## 2014-08-25 DIAGNOSIS — I480 Paroxysmal atrial fibrillation: Secondary | ICD-10-CM | POA: Diagnosis not present

## 2014-08-25 DIAGNOSIS — I5042 Chronic combined systolic (congestive) and diastolic (congestive) heart failure: Secondary | ICD-10-CM

## 2014-08-25 DIAGNOSIS — G4733 Obstructive sleep apnea (adult) (pediatric): Secondary | ICD-10-CM

## 2014-08-25 LAB — BASIC METABOLIC PANEL
BUN: 24 mg/dL — ABNORMAL HIGH (ref 6–23)
CHLORIDE: 101 meq/L (ref 96–112)
CO2: 31 mEq/L (ref 19–32)
CREATININE: 1.34 mg/dL (ref 0.40–1.50)
Calcium: 9.6 mg/dL (ref 8.4–10.5)
GFR: 55.43 mL/min — ABNORMAL LOW (ref >60.00)
GLUCOSE: 108 mg/dL — AB (ref 70–99)
Potassium: 3.7 mEq/L (ref 3.5–5.1)
SODIUM: 138 meq/L (ref 135–145)

## 2014-08-25 LAB — CBC WITH DIFFERENTIAL/PLATELET
Basophils Absolute: 0.1 10*3/uL (ref 0.0–0.1)
Basophils Relative: 0.9 % (ref 0.0–3.0)
EOS PCT: 1.5 % (ref 0.0–5.0)
Eosinophils Absolute: 0.1 10*3/uL (ref 0.0–0.7)
HEMATOCRIT: 39.2 % (ref 39.0–52.0)
Hemoglobin: 12.7 g/dL — ABNORMAL LOW (ref 13.0–17.0)
Lymphocytes Relative: 16.1 % (ref 12.0–46.0)
Lymphs Abs: 0.9 10*3/uL (ref 0.7–4.0)
MCHC: 32.5 g/dL (ref 30.0–36.0)
MCV: 81.4 fl (ref 78.0–100.0)
Monocytes Absolute: 0.5 10*3/uL (ref 0.1–1.0)
Monocytes Relative: 8.7 % (ref 3.0–12.0)
Neutro Abs: 4.1 10*3/uL (ref 1.4–7.7)
Neutrophils Relative %: 72.8 % (ref 43.0–77.0)
PLATELETS: 141 10*3/uL — AB (ref 150.0–400.0)
RBC: 4.81 Mil/uL (ref 4.22–5.81)
RDW: 19.3 % — AB (ref 11.5–15.5)
WBC: 5.7 10*3/uL (ref 4.0–10.5)

## 2014-08-25 LAB — PROTIME-INR
INR: 1.8 ratio — AB (ref 0.8–1.0)
Prothrombin Time: 19.4 s — ABNORMAL HIGH (ref 9.6–13.1)

## 2014-08-25 LAB — APTT: aPTT: 38.6 s — ABNORMAL HIGH (ref 23.4–32.7)

## 2014-08-25 MED ORDER — CARVEDILOL 25 MG PO TABS: 12.5000 mg | ORAL_TABLET | Freq: Two times a day (BID) | ORAL | Status: DC

## 2014-08-25 NOTE — Patient Instructions (Signed)
Your physician has requested that you have a TEE on Monday, April 4. During a TEE, sound waves are used to create images of your heart. It provides your doctor with information about the size and shape of your heart and how well your heart's chambers and valves are working. In this test, a transducer is attached to the end of a flexible tube that's guided down your throat and into your esophagus (the tube leading from you mouth to your stomach) to get a more detailed image of your heart. You are not awake for the procedure. Please see the instruction sheet given to you today. For further information please visit [email or web address]  Your physician recommends that you have lab work TODAY (BMET, CBC, PT, INR)  Your physician has recommended you make the following change in your medication:  1) DECREASE COREG to 12.5 mg TWICE DAILY  Your physician recommends that you schedule a follow-up appointment in: 3 months with Dr. [name].

## 2014-08-26 ENCOUNTER — Encounter (HOSPITAL_COMMUNITY)
Admission: RE | Admit: 2014-08-26 | Discharge: 2014-08-26 | Disposition: A | Discharge status: Home / Self Care | Payer: Self-pay | Source: Ambulatory Visit | Attending: Cardiology | Admitting: Cardiology

## 2014-08-26 ENCOUNTER — Ambulatory Visit (INDEPENDENT_AMBULATORY_CARE_PROVIDER_SITE_OTHER): Payer: Medicare Other | Admitting: Pharmacist

## 2014-08-26 DIAGNOSIS — I480 Paroxysmal atrial fibrillation: Secondary | ICD-10-CM

## 2014-08-26 DIAGNOSIS — Z5181 Encounter for therapeutic drug level monitoring: Secondary | ICD-10-CM

## 2014-08-27 ENCOUNTER — Encounter: Payer: Self-pay | Admitting: *Deleted

## 2014-08-28 ENCOUNTER — Encounter (HOSPITAL_COMMUNITY)
Admission: RE | Admit: 2014-08-28 | Discharge: 2014-08-28 | Disposition: A | Discharge status: Home / Self Care | Payer: Self-pay | Source: Ambulatory Visit | Attending: Cardiology | Admitting: Cardiology

## 2014-08-28 DIAGNOSIS — I5023 Acute on chronic systolic (congestive) heart failure: Secondary | ICD-10-CM | POA: Insufficient documentation

## 2014-08-31 ENCOUNTER — Other Ambulatory Visit: Payer: Self-pay | Admitting: Cardiology

## 2014-08-31 ENCOUNTER — Encounter (HOSPITAL_COMMUNITY)
Admission: RE | Disposition: A | Discharge status: Home / Self Care | Payer: Self-pay | Source: Ambulatory Visit | Attending: Cardiology

## 2014-08-31 ENCOUNTER — Ambulatory Visit (HOSPITAL_COMMUNITY)
Admission: RE | Admit: 2014-08-31 | Discharge: 2014-08-31 | Disposition: A | Discharge status: Home / Self Care | Payer: Medicare Other | Source: Ambulatory Visit | Attending: Cardiology | Admitting: Cardiology

## 2014-08-31 ENCOUNTER — Encounter (HOSPITAL_COMMUNITY): Payer: Self-pay | Admitting: *Deleted

## 2014-08-31 DIAGNOSIS — Z950 Presence of cardiac pacemaker: Secondary | ICD-10-CM | POA: Diagnosis not present

## 2014-08-31 DIAGNOSIS — I480 Paroxysmal atrial fibrillation: Secondary | ICD-10-CM | POA: Insufficient documentation

## 2014-08-31 DIAGNOSIS — Z79899 Other long term (current) drug therapy: Secondary | ICD-10-CM | POA: Diagnosis not present

## 2014-08-31 DIAGNOSIS — M1990 Unspecified osteoarthritis, unspecified site: Secondary | ICD-10-CM | POA: Insufficient documentation

## 2014-08-31 DIAGNOSIS — E785 Hyperlipidemia, unspecified: Secondary | ICD-10-CM | POA: Insufficient documentation

## 2014-08-31 DIAGNOSIS — G4733 Obstructive sleep apnea (adult) (pediatric): Secondary | ICD-10-CM | POA: Diagnosis not present

## 2014-08-31 DIAGNOSIS — Z87891 Personal history of nicotine dependence: Secondary | ICD-10-CM | POA: Insufficient documentation

## 2014-08-31 DIAGNOSIS — Z955 Presence of coronary angioplasty implant and graft: Secondary | ICD-10-CM | POA: Diagnosis not present

## 2014-08-31 DIAGNOSIS — I350 Nonrheumatic aortic (valve) stenosis: Secondary | ICD-10-CM | POA: Diagnosis not present

## 2014-08-31 DIAGNOSIS — I519 Heart disease, unspecified: Secondary | ICD-10-CM | POA: Diagnosis not present

## 2014-08-31 DIAGNOSIS — Z951 Presence of aortocoronary bypass graft: Secondary | ICD-10-CM | POA: Diagnosis not present

## 2014-08-31 DIAGNOSIS — K219 Gastro-esophageal reflux disease without esophagitis: Secondary | ICD-10-CM | POA: Insufficient documentation

## 2014-08-31 DIAGNOSIS — Z8249 Family history of ischemic heart disease and other diseases of the circulatory system: Secondary | ICD-10-CM | POA: Diagnosis not present

## 2014-08-31 DIAGNOSIS — Z6836 Body mass index (BMI) 36.0-36.9, adult: Secondary | ICD-10-CM | POA: Insufficient documentation

## 2014-08-31 DIAGNOSIS — I2510 Atherosclerotic heart disease of native coronary artery without angina pectoris: Secondary | ICD-10-CM | POA: Diagnosis not present

## 2014-08-31 DIAGNOSIS — E669 Obesity, unspecified: Secondary | ICD-10-CM | POA: Insufficient documentation

## 2014-08-31 DIAGNOSIS — Z7901 Long term (current) use of anticoagulants: Secondary | ICD-10-CM | POA: Insufficient documentation

## 2014-08-31 DIAGNOSIS — I10 Essential (primary) hypertension: Secondary | ICD-10-CM | POA: Diagnosis not present

## 2014-08-31 DIAGNOSIS — Z7982 Long term (current) use of aspirin: Secondary | ICD-10-CM | POA: Insufficient documentation

## 2014-08-31 DIAGNOSIS — I5042 Chronic combined systolic (congestive) and diastolic (congestive) heart failure: Secondary | ICD-10-CM | POA: Insufficient documentation

## 2014-08-31 DIAGNOSIS — Z79891 Long term (current) use of opiate analgesic: Secondary | ICD-10-CM | POA: Diagnosis not present

## 2014-08-31 DIAGNOSIS — Z7951 Long term (current) use of inhaled steroids: Secondary | ICD-10-CM | POA: Diagnosis not present

## 2014-08-31 DIAGNOSIS — Z9889 Other specified postprocedural states: Secondary | ICD-10-CM | POA: Diagnosis not present

## 2014-08-31 HISTORY — PX: TEE WITHOUT CARDIOVERSION: SHX5443

## 2014-08-31 SURGERY — ECHOCARDIOGRAM, TRANSESOPHAGEAL
Anesthesia: Moderate Sedation

## 2014-08-31 MED ORDER — SODIUM CHLORIDE 0.9 % IV SOLN
INTRAVENOUS | Status: DC
  Administered 2014-08-31: 08:00:00 via INTRAVENOUS

## 2014-08-31 MED ORDER — FENTANYL CITRATE 0.05 MG/ML IJ SOLN
INTRAMUSCULAR | Status: DC | PRN
  Administered 2014-08-31 (×2): 25 ug via INTRAVENOUS

## 2014-08-31 MED ORDER — FENTANYL CITRATE 0.05 MG/ML IJ SOLN
INTRAMUSCULAR | Status: AC
  Filled 2014-08-31: qty 2

## 2014-08-31 MED ORDER — MIDAZOLAM HCL 5 MG/ML IJ SOLN
INTRAMUSCULAR | Status: AC
  Filled 2014-08-31: qty 2

## 2014-08-31 MED ORDER — MIDAZOLAM HCL 10 MG/2ML IJ SOLN
INTRAMUSCULAR | Status: DC | PRN
  Administered 2014-08-31 (×2): 2 mg via INTRAVENOUS

## 2014-08-31 MED ORDER — BUTAMBEN-TETRACAINE-BENZOCAINE 2-2-14 % EX AERO
INHALATION_SPRAY | CUTANEOUS | Status: DC | PRN
Start: 2014-08-31 — End: 2014-08-31
  Administered 2014-08-31: 2 via TOPICAL

## 2014-08-31 NOTE — Progress Notes (Signed)
  Echocardiogram Echocardiogram Transesophageal has been performed.  Brandon Klein [name] 08/31/2014, 9:50 AM

## 2014-08-31 NOTE — Interval H&P Note (Signed)
History and Physical Interval Note:  08/31/2014 8:45 AM  Brandon Klein  has presented today for surgery, with the diagnosis of AORTIC STENOSIS   The various methods of treatment have been discussed with the patient and family. After consideration of risks, benefits and other options for treatment, the patient has consented to  Procedure(s): TRANSESOPHAGEAL ECHOCARDIOGRAM (TEE) (N/A) as a surgical intervention .  The patient's history has been reviewed, patient examined, no change in status, stable for surgery.  I have reviewed the patient's chart and labs.  Questions were answered to the patient's satisfaction.     TURNER,TRACI R

## 2014-08-31 NOTE — Discharge Instructions (Signed)
Transesophageal Echocardiogram °Transesophageal echocardiography (TEE) is a picture test of your heart using sound waves. The pictures taken can give very detailed pictures of your heart. This can help your doctor see if there are problems with your heart. TEE can check: °· If your heart has blood clots in it. °· How well your heart valves are working. °· If you have an infection on the inside of your heart. °· Some of the major arteries of your heart. °· If your heart valve is working after a repair. °· Your heart before a procedure that uses a shock to your heart to get the rhythm back to normal. °BEFORE THE PROCEDURE °· Do not eat or drink for 6 hours before the procedure or as told by your doctor. °· Make plans to have someone drive you home after the procedure. Do not drive yourself home. °· An IV tube will be put in your arm. °PROCEDURE °· You will be given a medicine to help you relax (sedative). It will be given through the IV tube. °· A numbing medicine will be sprayed or gargled in the back of your throat to help numb it. °· The tip of the probe is placed into the back of your mouth. You will be asked to swallow. This helps to pass the probe into your esophagus. °· Once the tip of the probe is in the right place, your doctor can take pictures of your heart. °· You may feel pressure at the back of your throat. °AFTER THE PROCEDURE °· You will be taken to a recovery area so the sedative can wear off. °· Your throat may be sore and scratchy. This will go away slowly over time. °· You will go home when you are fully awake and able to swallow liquids. °· You should have someone stay with you for the next 24 hours. °· Do not drive or operate machinery for the next 24 hours. °Document Released: 03/12/2009 Document Revised: 05/20/2013 Document Reviewed: 11/14/2012 °[employer]® Patient Information ©2015 [employer]. This information is not intended to replace advice given to you by your health care provider. Make  sure you discuss any questions you have with your health care provider. ° °

## 2014-08-31 NOTE — CV Procedure (Signed)
PROCEDURE NOTE  Procedure:  Transesophageal echocardiogram Operator:  Armanda Magic, MD Indications: aortic stenosis Complications:None IV Meds: Versed 2mg , Fentanyl IV  Results: Moderately dilated LV with with severe LV dysfunction EF 20-25% Mildly dilated RV Mildly dilated RA with pacer wire noted Moderate dilated LA with no evidence of thrombus in the LA or appendage Normal TV with mild TR Normal MV with mild MR Normal PV Trileaflet AV with thickened and calcified leaflets with moderately reduced leaflet excursion.  The planimetered AVA was 1.14 - 1.4cm2 consistent with moderate AS.  Trivial AR. Normal thoracic and ascending aorta  The patient tolerated the procedure well without complications and was transferred back to his room in stable condition.  Signed: Armanda Magic, MD [facility] 08/31/2014

## 2014-08-31 NOTE — H&P (View-Only) (Signed)
  Cardiology Office Note   Date:  08/25/2014   ID:  Brandon Klein, DOB 04/25/1941, MRN [id number]  PCP:  BARNES,ELIZABETH STEWART, MD  Cardiologist:   Celia Friedland R, MD   Chief Complaint  Patient presents with  . Coronary Artery Disease  . Hypertension  . Congestive Heart Failure      History of Present Illness: Brandon Klein is a 73 y.o. male with a hx of CAD, status post prior stenting to the LAD and balloon angioplasty to the circumflex, cath 10/2013 with severe 3 vessel ASCAD s/p CABG (LIMA-LAD, SVG to diag,SVG to circ, SVG to PDA) with normal LVF at time of cath, paroxysmal atrial fibrillation, bradycardia s/p PPM, moderate to severe aortic stenosis by recent dobutamine echo, HTN, HL.  He developed acute CHF about 6 months after CABG and was found to have severe LV dysfunction.  Cath 07/07/2014 showed occluded SVG to PDA, all other grafts patent. EF down felt secondary to  NICM but there has been some concern regarding possible worsening AS.  There was some concern that his AS was underestimated due to LV dysfunction and he underwent dobutamine echo. The aortic valve peak velocity increased from 2.93m/sec to 3.29 m/sec at peak dobutamine infusion. The mean AV gradient increased from 20mmHg to 37.7mmHg at peak dobutamine infusion. The AVA was calculated anywhere from 1.4cm2 at baselineto 1.15cm2 at mid dose and 1.4cm2 at high dose. All data consistent with at least moderate aortic stenosis.  He has been set up for TEE to get a planimetered valve area.    When I saw him a few weeks ago he was still having some problems with volume overload and his Lasix was increased to 100mg BID and aldactone was added.  He presents today for followup after increasing diuretics.  He is feeling better.  He has lost 14 pounds since I saw him last.  He says that his breathing is doing well and is not really SOB.  He denies any LE edema.  He still thinks he has some mild fluid in his abdomen.  He denies any  dizziness or syncope.  He denies any palpitations.    Past Medical History  Diagnosis Date  . Syncope     a. vasovagal with documented bradycardia.  . Pacemaker     a. 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 b. MDT generator replacement 2012.  . Obesity   . HTN (hypertension)   . HLD (hyperlipidemia)   . DJD (degenerative joint disease)   . GERD (gastroesophageal reflux disease)   . Impaired fasting glucose     A1C check every year  . PAF (paroxysmal atrial fibrillation)     on chronic systemic anticoagulation  . Coronary artery disease     a. s/p PCI of LAD 1997. b. Cutting balloon angioplasty to LCx in 2002. c. s/p cath 11/2013 with severe 3 vessel ASCAD s/p CABG with LIMA to LAD, SVG to diag, SVG to left circ and SVG to PDA. d. cath 07/07/2014 occluded SVG to PDA, all other grafts patent. 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 Postoperative atrial fibrillation   . AS (aortic stenosis)     mild by echo 10/2013  . Hypokalemia   . Hypomagnesemia   . Carotid artery plaque     a. Duplex 10/2013: mild calcific plaque origin ICA. Left: intimal wall thickening CCA. 0-39% BICA.  . PVC's (premature ventricular contractions)   . OSA (obstructive sleep apnea)   . Chronic   diastolic CHF (congestive heart failure)   . GI bleeding     a.  with benign gastric polypectomy 05/2014.  . Epistaxis     Past Surgical History  Procedure Laterality Date  . Cholecystectomy    . Knee arthroscopy  2000    Left  . Angioplasty  1997  . Pacemaker insertion  2002  . Coronary artery bypass graft N/A 12/01/2013    Procedure: CORONARY ARTERY BYPASS GRAFT TIMES FOUR USING LEFT INTERNAL MAMMARY ARTERY TO LAD, SAPHENOUS VEIN GRAFTS TO DIAGONAL, CIRCUMFELX AND POSTERIOR DESCENDING;  Surgeon: Peter Van Trigt, MD;  Location: [facility] OR;  Service: Open Heart Surgery;  Laterality: N/A;  . Esophagogastroduodenoscopy (egd) with propofol N/A 05/05/2014    Procedure: ESOPHAGOGASTRODUODENOSCOPY (EGD) WITH PROPOFOL;   Surgeon: Martin K Johnson, MD;  Location: [facility];  Service: Endoscopy;  Laterality: N/A;  . Colonoscopy with propofol N/A 05/05/2014    Procedure: COLONOSCOPY WITH PROPOFOL;  Surgeon: Martin K Johnson, MD;  Location: [facility];  Service: Endoscopy;  Laterality: N/A;  . Left heart catheterization with coronary angiogram N/A 11/25/2013    Procedure: LEFT HEART CATHETERIZATION WITH CORONARY ANGIOGRAM;  Surgeon: Rosemarie Galvis R Anab Vivar, MD;  Location: [facility] CATH [facility];  Service: Cardiovascular;  Laterality: N/A;  . Left and right heart catheterization with coronary/graft angiogram N/A 07/07/2014    Procedure: LEFT AND RIGHT HEART CATHETERIZATION WITH CORONARY/GRAFT ANGIOGRAM;  Surgeon: Christopher D McAlhany, MD;  Location: [facility];  Service: Cardiovascular;  Laterality: N/A;     Current Outpatient Prescriptions  Medication Sig Dispense Refill  . aspirin EC 81 MG EC tablet Take 1 tablet (81 mg total) by mouth daily. (Patient taking differently: Take 81 mg by mouth every morning. )    . atorvastatin (LIPITOR) 20 MG tablet Take 20 mg by mouth daily at 6 PM.     . carvedilol (COREG) 25 MG tablet Take 1 tablet (25 mg total) by mouth 2 (two) times daily with a meal. 60 tablet 5  . Cholecalciferol (VITAMIN D) 2000 UNITS tablet Take 2,000 Units by mouth daily.    . Cyanocobalamin (VITAMIN B-12 PO) Take 1 tablet by mouth every morning.    . docusate sodium (COLACE) 100 MG capsule Take 200 mg by mouth every morning.     . ferrous sulfate 325 (65 FE) MG tablet Take 325 mg by mouth daily with breakfast.     . fexofenadine (ALLEGRA) 180 MG tablet Take 180 mg by mouth every morning.     . fluticasone (FLONASE) 50 MCG/ACT nasal spray Place 2 sprays into the nose every morning.     . furosemide (LASIX) 20 MG tablet Take 1 tablet (20 mg total) by mouth daily. (Patient taking differently: Take 20 mg by mouth 2 (two) times daily. ) 60 tablet 3  . furosemide (LASIX) 80 MG tablet Increase to 1 and 1/2 tablets (120 mg)  twice a day for 3 days.  Then resume 1 tablet twice daily. 60 tablet 2  . Glucosamine-Chondroit-Vit C-Mn (GLUCOSAMINE CHONDR 1500 COMPLX) CAPS Take 1 capsule by mouth every morning.     . hydrALAZINE (APRESOLINE) 25 MG tablet Take 1 tablet (25 mg total) by mouth 3 (three) times daily. 90 tablet 5  . isosorbide mononitrate (IMDUR) 30 MG 24 hr tablet Take 1 tablet (30 mg total) by mouth daily. 30 tablet 5  . L-Lysine 500 MG CAPS Take 1 capsule by mouth every morning.     . Magnesium Oxide 250 MG TABS Take 2 tablets (500 mg   total) by mouth 2 (two) times daily. 120 tablet 6  . meclizine (ANTIVERT) 25 MG tablet Take 25 mg by mouth daily as needed for dizziness.     . montelukast (SINGULAIR) 10 MG tablet take 1 tablet by mouth at bedtime 30 tablet 1  . Multiple Vitamin (MULTIVITAMIN) tablet Take 1 tablet by mouth every morning.     . nitroGLYCERIN (NITROSTAT) 0.4 MG SL tablet Place 1 tablet (0.4 mg total) under the tongue every 5 (five) minutes as needed for chest pain. 30 tablet 3  . ONE TOUCH ULTRA TEST test strip 1 each by Other route every morning.     . ONETOUCH DELICA LANCETS FINE MISC 1 each by Other route every morning.     . pantoprazole (PROTONIX) 40 MG tablet Take 40 mg by mouth every morning.     . Phenylephrine-DM-GG-APAP (DELSYM COUGH/COLD DAYTIME PO) Take 10 mLs by mouth 2 (two) times daily as needed (cold).     . potassium chloride SA (K-DUR,KLOR-CON) 20 MEQ tablet Increase to 3 tablets (60 mEq) twice daily for 3 days.  Then resume 2 tablets (40 mEq) twice daily. 120 tablet 2  . spironolactone (ALDACTONE) 25 MG tablet Take 1 tablet (25 mg total) by mouth daily. 90 tablet 3  . traMADol (ULTRAM) 50 MG tablet 1-2 every 4 hours as needed for cough or pain 40 tablet 0  . warfarin (COUMADIN) 5 MG tablet Take as directed by coumadin clinic (Patient taking differently: Take 5 mg by mouth See admin instructions. Take as directed by coumadin clinic) 120 tablet 0   No current facility-administered  medications for this visit.    Allergies:   Acetaminophen-codeine; Amoxicillin; and Diovan    Social History:  The patient  reports that he quit smoking about 48 years ago. His smoking use included Cigarettes. He has a 10 pack-year smoking history. He has never used smokeless tobacco. He reports that he drinks alcohol. He reports that he does not use illicit drugs.   Family History:  The patient's family history includes Colon cancer in his mother; Heart attack in his brother and mother; Leukemia in his father.    ROS:  Please see the history of present illness.   Otherwise, review of systems are positive for none.   All other systems are reviewed and negative.    PHYSICAL EXAM: VS:  BP 90/58 mmHg  Pulse 67  Ht 5' 7" (1.702 m)  Wt 230 lb 3.2 oz (104.418 kg)  BMI 36.05 kg/m2  SpO2 96% , BMI Body mass index is 36.05 kg/(m^2). GEN: Well nourished, well developed, in no acute distress HEENT: normal Neck: no JVD, carotid bruits, or masses Cardiac: RRR; no murmurs, rubs, or gallops,no edema  Respiratory:  clear to auscultation bilaterally, normal work of breathing GI: soft, nontender, nondistended, + BS MS: no deformity or atrophy Skin: warm and dry, no rash Neuro:  Strength and sensation are intact Psych: euthymic mood, full affect   EKG:  EKG is not ordered today.    Recent Labs: 07/03/2014: Magnesium 2.0; TSH 0.941 07/09/2014: Hemoglobin 11.7*; Platelets 109* 07/22/2014: ALT 25 08/12/2014: BUN 24*; Creatinine 1.32; Potassium 4.1; Pro B Natriuretic peptide (BNP) 1197.0*; Sodium 138    Lipid Panel    Component Value Date/Time   CHOL 113 07/22/2014 1226   CHOL 116 07/14/2013 0937   TRIG 91 07/22/2014 1226   TRIG 201.0* 07/14/2013 0937   HDL 40 07/22/2014 1226   HDL 36.10* 07/14/2013 0937   CHOLHDL 3 07/14/2013   0937   VLDL 40.2* 07/14/2013 0937   LDLCALC 55 07/22/2014 1226   LDLDIRECT 47.9 07/14/2013 0937      Wt Readings from Last 3 Encounters:  08/25/14 230 lb 3.2  oz (104.418 kg)  08/14/14 244 lb (110.678 kg)  08/04/14 234 lb (106.142 kg)    ASSESSMENT AND PLAN:  1. Shortness of breath - I suspect that his dyspnea is multifactorial from underlying GERD, CHF with worsening LVF and anemia. His lungs are clear on exam today but still has LE edema. Hbg has been stable.   2. Chronic combined systolic/diastolic CHF class II: right and left heart cath showed severe 3 vessel ASCAD with 3/4 grafts patent (SVG to PDA ocluded) and SVG to OM with 90% stenosis prox to the graft insertion, ischemic DCM and moderate AS. PCW was 18. Continue BB/Hydralazine/nitrates/diuetics. Not on ACE I due to renal insuff.  Repeat echo in 2 months on max med therapy and if still reduced refer to EP for possible upgrade to AICD. He appears euvolemic on exam today  3. CAD s/p CABG 12/02/13 with 3/4 grafts patent by recent cath (occluded SVG to PDA and SVG to OM patent with 90% stenosis proximal to the graft insertion in the OM with patent flow distally) :Continue aspirin, statin, beta blocker.  4. PAF (paroxysmal atrial fibrillation): Maintaining NSR. Continue Coumadin/BB.   5. Mild to moderate aortic stenosis by recent echo - Mean gradient was 19mmHg and prior to CABG was 17mmHg. AS by recent cath was moderate with peak to peak of 15mmHg. Dobutamine echo showed moderate AS with aortic valve peak velocity increased from 2.93m/sec to 3.29 m/sec at peak dobutamine infusion. The mean AV gradient increased from 20mmHg to 37.7mmHg at peak dobutamine infusion. The AVA was calculated anywhere from 1.4cm2 at baselineto 1.15cm2 at mid dose and 1.4cm2 at high dose. All data consistent with at least moderate aortic stenosis but mean gradient at peak dobutamine nearing severe range. I have recommended that we proceed with TEE to get a planimetered valve area since we have no etiology for decline in LVF (out of proportion to CAD and normal prior to CABG). He is scheduled next  month.  6. Essential hypertension: controlled. Continue BB/Hydralazine.  His BP is on the low side today and runs in the low 100's to 90's in cardiac rehab.  I have asked him to decrease Coreg to 12.5mg BID.   7. Dyslipidemia: LDL at goal at 42. Continue statin.   8. Bradycardia s/p MDT pacemaker: Follow up with the EP as planned.   9. OSA s/p BiPAP titration to 20/16cm H2O      Current medicines are reviewed at length with the patient today.  The patient does not have concerns regarding medicines.  The following changes have been made:  Decrease Coreg to 12.5mg BID  Labs/ tests ordered today include: BMET  No orders of the defined types were placed in this encounter.     Disposition:   FU with me in 6 months   Signed, Nashton Belson R, MD  08/25/2014 11:17 AM    [facility] [address], [place], [place]  [address] Phone: [phone]; Fax: [phone]      

## 2014-09-01 ENCOUNTER — Encounter (HOSPITAL_COMMUNITY): Payer: Self-pay | Admitting: Cardiology

## 2014-09-02 ENCOUNTER — Encounter (HOSPITAL_COMMUNITY)
Admission: RE | Admit: 2014-09-02 | Discharge: 2014-09-02 | Disposition: A | Discharge status: Home / Self Care | Payer: Self-pay | Source: Ambulatory Visit | Attending: Cardiology | Admitting: Cardiology

## 2014-09-04 ENCOUNTER — Encounter (HOSPITAL_COMMUNITY)
Admission: RE | Admit: 2014-09-04 | Discharge: 2014-09-04 | Disposition: A | Discharge status: Home / Self Care | Payer: Self-pay | Source: Ambulatory Visit | Attending: Cardiology | Admitting: Cardiology

## 2014-09-07 ENCOUNTER — Encounter (HOSPITAL_COMMUNITY)
Admission: RE | Admit: 2014-09-07 | Discharge: 2014-09-07 | Disposition: A | Discharge status: Home / Self Care | Payer: Self-pay | Source: Ambulatory Visit | Attending: Cardiology | Admitting: Cardiology

## 2014-09-09 ENCOUNTER — Encounter (HOSPITAL_COMMUNITY)
Admission: RE | Admit: 2014-09-09 | Discharge: 2014-09-09 | Disposition: A | Discharge status: Home / Self Care | Payer: Self-pay | Source: Ambulatory Visit | Attending: Cardiology | Admitting: Cardiology

## 2014-09-10 ENCOUNTER — Ambulatory Visit (INDEPENDENT_AMBULATORY_CARE_PROVIDER_SITE_OTHER): Payer: Medicare Other | Admitting: *Deleted

## 2014-09-10 ENCOUNTER — Encounter: Payer: Self-pay | Admitting: Internal Medicine

## 2014-09-10 DIAGNOSIS — R001 Bradycardia, unspecified: Secondary | ICD-10-CM

## 2014-09-10 LAB — MDC_IDC_ENUM_SESS_TYPE_INCLINIC
Battery Voltage: 2.79 V
Brady Statistic AP VP Percent: 1 %
Brady Statistic AS VP Percent: 4 %
Brady Statistic AS VS Percent: 64 %
Date Time Interrogation Session: 20160414145841
Lead Channel Impedance Value: 443 Ohm
Lead Channel Pacing Threshold Pulse Width: 0.4 ms
Lead Channel Sensing Intrinsic Amplitude: 2 mV
Lead Channel Setting Pacing Amplitude: 2 V
Lead Channel Setting Pacing Pulse Width: 0.4 ms
Lead Channel Setting Sensing Sensitivity: 5.6 mV
MDC IDC MSMT BATTERY IMPEDANCE: 159 Ohm
MDC IDC MSMT BATTERY REMAINING LONGEVITY: 138 mo
MDC IDC MSMT LEADCHNL RA PACING THRESHOLD AMPLITUDE: 0.5 V
MDC IDC MSMT LEADCHNL RV IMPEDANCE VALUE: 759 Ohm
MDC IDC MSMT LEADCHNL RV PACING THRESHOLD AMPLITUDE: 0.75 V
MDC IDC MSMT LEADCHNL RV PACING THRESHOLD PULSEWIDTH: 0.4 ms
MDC IDC MSMT LEADCHNL RV SENSING INTR AMPL: 15.67 mV
MDC IDC SET LEADCHNL RV PACING AMPLITUDE: 2.5 V
MDC IDC STAT BRADY AP VS PERCENT: 31 %

## 2014-09-10 NOTE — Progress Notes (Signed)
Pacemaker check in clinic. Normal device function. Thresholds, sensing, impedances consistent with previous measurements. Device programmed to maximize longevity. 7 mode switches--- <0.1%. 2 NSVT---longest 9 beats. Device programmed at appropriate safety margins. Histogram distribution appropriate for patient activity level. Device programmed to optimize intrinsic conduction. Estimated longevity 11.37yrs. [facility] 12/09/14 & ROV w/ Dr. Graciela Husbands in 62mo.

## 2014-09-11 ENCOUNTER — Other Ambulatory Visit: Payer: Self-pay | Admitting: Cardiology

## 2014-09-11 ENCOUNTER — Encounter (HOSPITAL_COMMUNITY)
Admission: RE | Admit: 2014-09-11 | Discharge: 2014-09-11 | Disposition: A | Discharge status: Home / Self Care | Payer: Self-pay | Source: Ambulatory Visit | Attending: Cardiology | Admitting: Cardiology

## 2014-09-11 DIAGNOSIS — I350 Nonrheumatic aortic (valve) stenosis: Secondary | ICD-10-CM

## 2014-09-14 ENCOUNTER — Encounter (HOSPITAL_COMMUNITY)
Admission: RE | Admit: 2014-09-14 | Discharge: 2014-09-14 | Disposition: A | Discharge status: Home / Self Care | Payer: Self-pay | Source: Ambulatory Visit | Attending: Cardiology | Admitting: Cardiology

## 2014-09-16 ENCOUNTER — Encounter (HOSPITAL_COMMUNITY)
Admission: RE | Admit: 2014-09-16 | Discharge: 2014-09-16 | Disposition: A | Discharge status: Home / Self Care | Payer: Self-pay | Source: Ambulatory Visit | Attending: Cardiology | Admitting: Cardiology

## 2014-09-18 ENCOUNTER — Encounter (HOSPITAL_COMMUNITY)
Admission: RE | Admit: 2014-09-18 | Discharge: 2014-09-18 | Disposition: A | Discharge status: Home / Self Care | Payer: Self-pay | Source: Ambulatory Visit | Attending: Cardiology | Admitting: Cardiology

## 2014-09-18 ENCOUNTER — Ambulatory Visit (INDEPENDENT_AMBULATORY_CARE_PROVIDER_SITE_OTHER): Payer: Medicare Other | Admitting: Pharmacist

## 2014-09-18 DIAGNOSIS — Z5181 Encounter for therapeutic drug level monitoring: Secondary | ICD-10-CM | POA: Diagnosis not present

## 2014-09-18 DIAGNOSIS — I480 Paroxysmal atrial fibrillation: Secondary | ICD-10-CM | POA: Diagnosis not present

## 2014-09-18 DIAGNOSIS — I4891 Unspecified atrial fibrillation: Secondary | ICD-10-CM

## 2014-09-18 LAB — POCT INR: INR: 1.8

## 2014-09-21 ENCOUNTER — Telehealth: Payer: Self-pay | Admitting: Cardiology

## 2014-09-21 ENCOUNTER — Encounter (HOSPITAL_COMMUNITY)
Admission: RE | Admit: 2014-09-21 | Discharge: 2014-09-21 | Disposition: A | Discharge status: Home / Self Care | Payer: Self-pay | Source: Ambulatory Visit | Attending: Cardiology | Admitting: Cardiology

## 2014-09-21 DIAGNOSIS — I5022 Chronic systolic (congestive) heart failure: Secondary | ICD-10-CM

## 2014-09-21 NOTE — Telephone Encounter (Signed)
Left message to call back  

## 2014-09-21 NOTE — Telephone Encounter (Signed)
New message     Talk to [facility] about seeing if he needs to refill a presc

## 2014-09-22 NOTE — Telephone Encounter (Signed)
Follow Up   Pt returning Katy's phone call. Please call back and discuss.  

## 2014-09-23 ENCOUNTER — Encounter (HOSPITAL_COMMUNITY)
Admission: RE | Admit: 2014-09-23 | Discharge: 2014-09-23 | Disposition: A | Discharge status: Home / Self Care | Payer: Self-pay | Source: Ambulatory Visit | Attending: Cardiology | Admitting: Cardiology

## 2014-09-24 MED ORDER — POTASSIUM CHLORIDE CRYS ER 20 MEQ PO TBCR: 40.0000 meq | EXTENDED_RELEASE_TABLET | Freq: Two times a day (BID) | ORAL | Status: DC

## 2014-09-24 MED ORDER — FUROSEMIDE 20 MG PO TABS: 20.0000 mg | ORAL_TABLET | Freq: Two times a day (BID) | ORAL | Status: DC

## 2014-09-24 MED ORDER — FUROSEMIDE 80 MG PO TABS: 80.0000 mg | ORAL_TABLET | Freq: Two times a day (BID) | ORAL | Status: DC

## 2014-09-24 NOTE — Telephone Encounter (Signed)
Patient is going out of town next Wednesday and is requesting medication refills for Lasix and K. Refills sent to pharmacy.  Medications sent to [facility]. Called [facility] and cancelled medications. Verified with representative. Rx refills sent to [facility] on [address].

## 2014-09-25 ENCOUNTER — Ambulatory Visit (INDEPENDENT_AMBULATORY_CARE_PROVIDER_SITE_OTHER): Payer: Medicare Other | Admitting: Internal Medicine

## 2014-09-25 ENCOUNTER — Encounter (HOSPITAL_COMMUNITY)
Admission: RE | Admit: 2014-09-25 | Discharge: 2014-09-25 | Disposition: A | Discharge status: Home / Self Care | Payer: Self-pay | Source: Ambulatory Visit | Attending: Cardiology | Admitting: Cardiology

## 2014-09-25 ENCOUNTER — Encounter: Payer: Self-pay | Admitting: Internal Medicine

## 2014-09-25 VITALS — BP 110/70 | HR 69 | Ht 67.0 in | Wt 232.0 lb

## 2014-09-25 DIAGNOSIS — R058 Other specified cough: Secondary | ICD-10-CM

## 2014-09-25 DIAGNOSIS — R0600 Dyspnea, unspecified: Secondary | ICD-10-CM | POA: Diagnosis not present

## 2014-09-25 DIAGNOSIS — R05 Cough: Secondary | ICD-10-CM

## 2014-09-25 LAB — PULMONARY FUNCTION TEST
DL/VA % pred: 69 %
DL/VA: 3.07 ml/min/mmHg/L
DLCO unc % pred: 73 %
DLCO unc: 21.19 ml/min/mmHg
FEF 25-75 Post: 2.77 L/sec
FEF 25-75 Pre: 2.44 L/sec
FEF2575-%CHANGE-POST: 13 %
FEF2575-%PRED-POST: 134 %
FEF2575-%PRED-PRE: 118 %
FEV1-%Change-Post: 3 %
FEV1-%Pred-Post: 122 %
FEV1-%Pred-Pre: 118 %
FEV1-POST: 3.44 L
FEV1-PRE: 3.32 L
FEV1FVC-%Change-Post: 1 %
FEV1FVC-%Pred-Pre: 100 %
FEV6-%CHANGE-POST: 3 %
FEV6-%Pred-Post: 125 %
FEV6-%Pred-Pre: 122 %
FEV6-POST: 4.56 L
FEV6-Pre: 4.42 L
FEV6FVC-%Change-Post: 0 %
FEV6FVC-%PRED-POST: 104 %
FEV6FVC-%Pred-Pre: 104 %
FVC-%CHANGE-POST: 2 %
FVC-%PRED-POST: 119 %
FVC-%Pred-Pre: 116 %
FVC-PRE: 4.51 L
FVC-Post: 4.63 L
Post FEV1/FVC ratio: 74 %
Post FEV6/FVC ratio: 99 %
Pre FEV1/FVC ratio: 74 %
Pre FEV6/FVC Ratio: 99 %
RV % pred: 79 %
RV: 1.89 L
TLC % pred: 99 %
TLC: 6.5 L

## 2014-09-25 NOTE — Assessment & Plan Note (Addendum)
-   Rx for gerd/cyclical cough 04/13/14 > improved 04/27/14 >relapsed 06/13/13  - 06/15/14 allergy profile  IgE  108  Multiple pos RAST / Eos 0     He clearly has ongoing issues with pnds/ cough but ok control with nasal steroids/ allegra and not convinced singulair helping so ok to d/c now  pepcid should also be d/c'd after a month off singulair to see if noct or early am cough flares and if so there is likely also a gerd issue, diet in meantime needs to be emphasized.  See instructions for specific recommendations which were reviewed directly with the patient who was given a copy with highlighter outlining the key components.

## 2014-09-25 NOTE — Assessment & Plan Note (Addendum)
04/13/2014  Walked RA x 3 laps @ 185 ft each stopped due to  Mild sob but nl pace and completed all 3 s desat - 06/15/2014   Walked RA x one lap @ 185 stopped due to  Sob/ desat to 87%  - Echo 06/18/2014 Severe global reduction in LV function; restrictive filling; biatrial enlargment; calcified aortic valve with moderate AS by continuity equation; mild MR; moderately reduced RV function.Severe global reduction in LV function; restrictive filling; biatrial enlargment; calcified aortic valve with moderate AS by continuity equation; mild MR; moderately reduced RV function. - PFTs  09/25/2014  wnl    I had an extended discussion with the patient reviewing all relevant studies completed to date and  lasting 15 to 20 minutes of a 25 minute visit on the following ongoing concerns:  1) no evidence of  copd or any significant lung disease at this point   2) suspect significant obesity / deconditioning but limited more by cardiac status and cardiac rehab is a great idea/ reinforced.   3) Each maintenance medication was reviewed in detail including most importantly the difference between maintenance and as needed and under what circumstances the prns are to be used.  Please see instructions for details which were reviewed in writing and the patient given a copy.   4) pulmonary f/u prn

## 2014-09-25 NOTE — Progress Notes (Signed)
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 Pulmonary Consult    Referred by Dr. Juluis Rainier. Pt c/o cough and SOB for the past 2-6 wks. He states that he is SOB any time- with or without any exertion. Cough is non prod. Breathing and cough seem worse when he lies down.   hoarseness started Holloweeen night comes and goes then really bad cough started 04/03/14  Assoc with sob proair didn't help Cough worse when lie down but never productive and settles down overnight and does not wake him up. Mostly sob when coughing, sob at rest when coughs real hard  rec Protonix 40 mg Take 30-60 min before first meal of the day and pepcid 20 mg at hs GERD diet  Prednisone 10 mg take  4 each am x 2 days,   2 each am x 2 days,  1 each am x 2 days and stop Take delsym two tsp every 12 hours and supplement if needed withl  Hycodan 7.5 on every 4 hours to suppress the urge to cough. Swallowing water or using ice chips/non mint and menthol containing candies (such as lifesavers or sugarless jolly ranchers) are also effective.  You should rest your voice and avoid activities that you know make you cough. Once you have eliminated the cough for 3 straight days try reducing the hycodan first,  then the delsym as tolerated.            04/27/2014 f/u ov/[facility]ert re: cough > sob  Chief Complaint  Patient presents with  . Follow-up    Cough is much improved- 85% better per pt. He states that his breathing is the same. No new co's today.   now that cough is better, able to get from first to second floor s sob, ranks both sob and cough as 85% better and no longer needs hydrocodan.   Still cough some at hs and in am but no excess mucus "not a problem" rec No change rx until fix anemia first.   06/15/2014 f/u ov Wert  Re recurrent cough while on ppi qam only  Chief Complaint  Patient presents with  . Acute Visit    Pt c/o cough x 2 days- non prod.   surgery was one week prior to OV  > gastric polyp with hgb 12.3 and not checked since and doe worse Stools are dark on fe  Sitting in chair "doing nothing" and experience acute onset of cough 06/11/13, worse if try to lie back to sleeps in recline/ dry hacking quality 24/7      2/4/2016NP  Follow up:   Re recurrent cough Rodena Goldmann  Returns  for 2 week follow up .   Last visit with cough flare and dyspnea.  Labs showed elevated BNP , echo showed severely reduced LVF , EF at 20% Referred to cardiology. Started on Coreg and Hydralazine. And Imdur.  He has ov with cards 07/03/14  Feels cough is better.  Recently dx with OSA , having trouble wearing CPAP .  rec Protonix 40 mg Take 30-60 min before first meal of the day and pepcid 20 mg at bedtime  GERD diet  Take delsym two tsp every 12 hours and supplement if needed with tramadol 50 up to 2   09/25/2014 f/u ov/Wert re: cough > sob with nl pfts and chf/AS followed by cards and improving on rx  Chief Complaint  Patient presents with  . Follow-up    Pt here to discuss PFT results. Pt states cough has improved. Pt c/o SOB only with excertion, slight wheezing in mornings.      Really Not limited by breathing from desired activities    No obvious day to day or daytime variabilty or assoc chronic cough or cp or chest  tightness, subjective wheeze overt sinus or hb symptoms. No unusual exp hx or h/o childhood pna/ asthma or knowledge of premature birth.  Sleeping ok without nocturnal  or early am exacerbation  of respiratory  c/o's or need for noct saba. Also denies any obvious fluctuation of symptoms with weather or environmental changes or other aggravating or alleviating factors except as outlined above   Current Medications, Allergies, Complete Past Medical History, Past Surgical History, Family History, and Social History were reviewed in [employer] record.  ROS  The following are not active complaints unless bolded sore throat, dysphagia, dental problems, itching, sneezing,  nasal congestion or excess/ purulent secretions, ear ache,   fever, chills, sweats, unintended wt loss, pleuritic or exertional cp, hemoptysis,  orthopnea pnd or leg swelling better , presyncope, palpitations, heartburn, abdominal pain, anorexia, nausea, vomiting, diarrhea  or change in bowel or urinary habits, change in stools or urine, dysuria,hematuria,  rash, arthralgias, visual complaints, headache, numbness weakness or ataxia or problems with walking or coordination,  change in mood/affect or memory.                   Objective:   Physical Exam  amb hoarse wm    04/27/2014      267 > 06/15/2014  252 >>254 07/02/2014 > 09/25/2014 232   HEENT: nl dentition, turbinates, and orophanx. Nl external ear canals without cough reflex   NECK :  without JVD/Nodes/TM/ nl carotid upstrokes bilaterally   LUNGS: no acc muscle use, decreased bs L base  without cough on insp or exp maneuvers   CV:  RRR  no s3 or ncrease in P2,    1+  Sym bilateral edema   II-III/ VI sem  ABD:  soft and nontender with nl excursion in the supine position. No bruits or organomegaly, bowel sounds nl  MS:  warm without deformities, calf tenderness, cyanosis or clubbing  SKIN: warm and dry without lesions    NEURO:  alert,  approp, no deficits       I personally reviewed images and agree with radiology impression as follows:  CXR:  07/04/14  No acute findings. Stable mild left costophrenic angle blunting and stable moderate cardiac enlargement.                Assessment & Plan:

## 2014-09-25 NOTE — Patient Instructions (Addendum)
Your lung function is normal   Ok to stop the pepcid in a month and see the night time or early cough gets in which case restart  Ok  Stop singulair now   Pulmonary follow up is as needed

## 2014-09-25 NOTE — Progress Notes (Signed)
PFT done today. 

## 2014-09-28 ENCOUNTER — Ambulatory Visit (INDEPENDENT_AMBULATORY_CARE_PROVIDER_SITE_OTHER): Payer: Medicare Other | Admitting: *Deleted

## 2014-09-28 ENCOUNTER — Ambulatory Visit (HOSPITAL_COMMUNITY): Payer: Medicare Other | Attending: Cardiology

## 2014-09-28 ENCOUNTER — Other Ambulatory Visit (HOSPITAL_COMMUNITY): Payer: Medicare Other

## 2014-09-28 ENCOUNTER — Encounter (HOSPITAL_COMMUNITY)
Admission: RE | Admit: 2014-09-28 | Discharge: 2014-09-28 | Disposition: A | Discharge status: Home / Self Care | Payer: Self-pay | Source: Ambulatory Visit | Attending: Cardiology | Admitting: Cardiology

## 2014-09-28 DIAGNOSIS — Z5181 Encounter for therapeutic drug level monitoring: Secondary | ICD-10-CM | POA: Diagnosis not present

## 2014-09-28 DIAGNOSIS — I480 Paroxysmal atrial fibrillation: Secondary | ICD-10-CM

## 2014-09-28 DIAGNOSIS — I5023 Acute on chronic systolic (congestive) heart failure: Secondary | ICD-10-CM | POA: Insufficient documentation

## 2014-09-28 DIAGNOSIS — I4891 Unspecified atrial fibrillation: Secondary | ICD-10-CM | POA: Diagnosis not present

## 2014-09-28 DIAGNOSIS — Z87891 Personal history of nicotine dependence: Secondary | ICD-10-CM | POA: Diagnosis not present

## 2014-09-28 DIAGNOSIS — I350 Nonrheumatic aortic (valve) stenosis: Secondary | ICD-10-CM | POA: Insufficient documentation

## 2014-09-28 DIAGNOSIS — I10 Essential (primary) hypertension: Secondary | ICD-10-CM | POA: Diagnosis not present

## 2014-09-28 LAB — POCT INR: INR: 2

## 2014-09-28 NOTE — Progress Notes (Unsigned)
Echocardiogram performed.  

## 2014-09-29 ENCOUNTER — Telehealth: Payer: Self-pay

## 2014-09-29 NOTE — Telephone Encounter (Signed)
Informed patient of results and verbal understanding expressed.  EP referral placed for scheduling. Patient agrees with treatment plan. 

## 2014-09-29 NOTE — Telephone Encounter (Signed)
-----   Message from [name], MD sent at 09/29/2014  9:55 AM EDT ----- Patient just had TEE confirming AVA planimetered was c/w moderate AS which was also calculated with dobutamine echo recently.  LVF has not improved and therefore needs AICD.  Please refer to EP

## 2014-09-30 ENCOUNTER — Encounter (HOSPITAL_COMMUNITY): Payer: Self-pay

## 2014-09-30 ENCOUNTER — Encounter: Payer: Self-pay | Admitting: Cardiology

## 2014-10-01 ENCOUNTER — Encounter: Payer: Self-pay | Admitting: Cardiology

## 2014-10-02 ENCOUNTER — Encounter (HOSPITAL_COMMUNITY): Payer: Self-pay

## 2014-10-05 ENCOUNTER — Encounter (HOSPITAL_COMMUNITY): Payer: Self-pay

## 2014-10-07 ENCOUNTER — Encounter (HOSPITAL_COMMUNITY): Payer: Self-pay

## 2014-10-08 ENCOUNTER — Encounter: Payer: Self-pay | Admitting: Cardiology

## 2014-10-09 ENCOUNTER — Encounter (HOSPITAL_COMMUNITY)
Admission: RE | Admit: 2014-10-09 | Discharge: 2014-10-09 | Disposition: A | Discharge status: Home / Self Care | Payer: Self-pay | Source: Ambulatory Visit | Attending: Cardiology | Admitting: Cardiology

## 2014-10-12 ENCOUNTER — Other Ambulatory Visit: Payer: Self-pay

## 2014-10-12 ENCOUNTER — Encounter (HOSPITAL_COMMUNITY)
Admission: RE | Admit: 2014-10-12 | Discharge: 2014-10-12 | Disposition: A | Discharge status: Home / Self Care | Payer: Self-pay | Source: Ambulatory Visit | Attending: Cardiology | Admitting: Cardiology

## 2014-10-14 ENCOUNTER — Encounter (HOSPITAL_COMMUNITY)
Admission: RE | Admit: 2014-10-14 | Discharge: 2014-10-14 | Disposition: A | Discharge status: Home / Self Care | Payer: Self-pay | Source: Ambulatory Visit | Attending: Cardiology | Admitting: Cardiology

## 2014-10-15 ENCOUNTER — Encounter: Payer: Self-pay | Admitting: Internal Medicine

## 2014-10-15 ENCOUNTER — Ambulatory Visit (INDEPENDENT_AMBULATORY_CARE_PROVIDER_SITE_OTHER): Payer: Medicare Other | Admitting: Internal Medicine

## 2014-10-15 VITALS — BP 120/74 | HR 81 | Ht 67.0 in | Wt 233.8 lb

## 2014-10-15 DIAGNOSIS — I5042 Chronic combined systolic (congestive) and diastolic (congestive) heart failure: Secondary | ICD-10-CM | POA: Diagnosis not present

## 2014-10-15 LAB — CUP PACEART INCLINIC DEVICE CHECK
Battery Impedance: 159 Ohm
Battery Remaining Longevity: 133 mo
Battery Voltage: 2.78 V
Brady Statistic AP VP Percent: 2 %
Brady Statistic AP VS Percent: 55 %
Brady Statistic AS VP Percent: 2 %
Lead Channel Setting Pacing Amplitude: 2 V
Lead Channel Setting Pacing Pulse Width: 0.4 ms
Lead Channel Setting Sensing Sensitivity: 5.6 mV
MDC IDC MSMT LEADCHNL RA IMPEDANCE VALUE: 455 Ohm
MDC IDC MSMT LEADCHNL RV IMPEDANCE VALUE: 889 Ohm
MDC IDC SESS DTM: 20160519100111
MDC IDC SET LEADCHNL RV PACING AMPLITUDE: 2.5 V
MDC IDC STAT BRADY AS VS PERCENT: 40 %

## 2014-10-15 NOTE — Progress Notes (Signed)
Electrophysiology Office Note   Date:  10/15/2014   ID:  Brandon Klein, DOB [birth year], MRN [id number]  PCP:  Gaye Alken, MD  Cardiologist:  Dr [name] Primary Electrophysiologist: Dr Graciela Husbands  Chief Complaint  Patient presents with  . Congestive Heart Failure     History of Present Illness: Brandon Klein is a 74 y.o. male who presents today for electrophysiology evaluation.   He presents today for evaluation of reduced EF.  He initially had a PPM implanted in 2002 by Dr Amil Amen for syncope and bradycardia.  He is felt to have vasovagal syncope per report.  He did well s/p PPM without any recurrence of syncope.  He underwent generator change by Dr Graciela Husbands in 2012.  He has done well from an EP standpoint and device management has been uneventful.  He has atral fibrillation for which he is anticoagulated.  He has CAD with reduce EF (25%).  Ef was normal 10/2013.  He has since had significant decline in his EF.  He is status post prior stenting to the LAD and balloon angioplasty to the circumflex, cath 10/2013 with severe 3 vessel ASCAD s/p CABG (LIMA-LAD, SVG to diag,SVG to circ, SVG to PDA) with normal LVF at time of cath, paroxysmal atrial fibrillation, bradycardia s/p PPM, moderate to severe aortic stenosis by recent dobutamine echo, HTN, HL. He developed acute CHF about 6 months after CABG and was found to have severe LV dysfunction. Cath 07/07/2014 showed occluded SVG to PDA, all other grafts patent.  There was some concern that his AS was underestimated due to LV dysfunction and he underwent dobutamine echo. The aortic valve peak velocity increased from 2.13m/sec to 3.29 m/sec at peak dobutamine infusion. The mean AV gradient increased from to 37.19mmHg at peak dobutamine infusion. The AVA was calculated anywhere from 1.4cm2 at baselineto 1.15cm2 at mid dose and 1.4cm2 at high dose. All data consistent with at least moderate aortic stenosis. By TEE 4/16, he had only moderate  AS. He has been treated with beta blockers, imdur/ hydralazine, and spironolactone.  He carries an allergy to diovan but does not recall this specifically.  He has moderate renal failure. He developed anemia several months ago due to a 2cm GI polyp.  He underwent endoscopic resection at [facility] 06/09/14.  He developed CHF afterward which required hospitalization and diuresis.  He has improved significantly since that time.  He reports that he feels "pretty good" at this time.  He feels that he is living a normal life.  He plays golf.  He denies SOB with activity.  He reports that he is "weak".  He has had orthopnea previously which is improved.   Today, he denies symptoms of palpitations, chest pain,  lower extremity edema, claudication, dizziness, presyncope, syncope, bleeding, or neurologic sequela. The patient is tolerating medications without difficulties and is otherwise without complaint today.    Past Medical History  Diagnosis Date  . Syncope     a. vasovagal with documented bradycardia.  . Pacemaker     a. 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 b. MDT generator replacement 2012.  . Obesity   . HTN (hypertension)   . HLD (hyperlipidemia)   . DJD (degenerative joint disease)   . GERD (gastroesophageal reflux disease)   . Impaired fasting glucose     A1C check every year  . PAF (paroxysmal atrial fibrillation)     on chronic systemic anticoagulation  . Coronary artery disease  a. s/p PCI of LAD 1997. b. Cutting balloon angioplasty to LCx in 2002. c. s/p cath 11/2013 with severe 3 vessel ASCAD s/p CABG with LIMA to LAD, SVG to diag, SVG to left circ and SVG to PDA. d. cath 07/07/2014 occluded SVG to PDA, all other grafts patent. 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 AS (aortic stenosis)     mild by echo 10/2013  . Hypokalemia   . Hypomagnesemia   . Carotid artery plaque     a. Duplex 10/2013: mild calcific plaque origin ICA. Left: intimal wall thickening CCA. 0-39% BICA.  Marland Kitchen  PVC's (premature ventricular contractions)   . OSA (obstructive sleep apnea)   . Chronic systolic dysfunction of left ventricle   . GI bleeding     a.  with benign gastric polypectomy 05/2014.  Marland Kitchen Epistaxis    Past Surgical History  Procedure Laterality Date  . Cholecystectomy    . Knee arthroscopy  2000    Left  . Angioplasty  1997  . Pacemaker insertion  2002    generator change MDT by Dr Graciela Husbands in 2012  . Coronary artery bypass graft N/A 12/01/2013    Procedure: CORONARY ARTERY BYPASS GRAFT TIMES FOUR USING LEFT INTERNAL MAMMARY ARTERY TO LAD, SAPHENOUS VEIN GRAFTS TO DIAGONAL, CIRCUMFELX AND POSTERIOR DESCENDING;  Surgeon: Kerin Perna, MD;  Location: [facility] OR;  Service: Open Heart Surgery;  Laterality: N/A;  . Esophagogastroduodenoscopy (egd) with propofol N/A 05/05/2014    Procedure: ESOPHAGOGASTRODUODENOSCOPY (EGD) WITH PROPOFOL;  Surgeon: Charolett Bumpers, MD;  Location: [facility] ENDOSCOPY;  Service: Endoscopy;  Laterality: N/A;  . Colonoscopy with propofol N/A 05/05/2014    Procedure: COLONOSCOPY WITH PROPOFOL;  Surgeon: Charolett Bumpers, MD;  Location: [facility] ENDOSCOPY;  Service: Endoscopy;  Laterality: N/A;  . Left heart catheterization with coronary angiogram N/A 11/25/2013    Procedure: LEFT HEART CATHETERIZATION WITH CORONARY ANGIOGRAM;  Surgeon: [name], MD;  Location: [facility] CATH LAB;  Service: Cardiovascular;  Laterality: N/A;  . Left and right heart catheterization with coronary/graft angiogram N/A 07/07/2014    Procedure: LEFT AND RIGHT HEART CATHETERIZATION WITH Isabel Caprice;  Surgeon: Kathleene Hazel, MD;  Location: [facility] CATH LAB;  Service: Cardiovascular;  Laterality: N/A;  . Tee without cardioversion N/A 08/31/2014    Procedure: TRANSESOPHAGEAL ECHOCARDIOGRAM (TEE);  Surgeon: [name], MD;  Location: [facility] ENDOSCOPY;  Service: Cardiovascular;  Laterality: N/A;     Current Outpatient Prescriptions  Medication Sig Dispense Refill  . aspirin EC 81 MG EC tablet  Take 1 tablet (81 mg total) by mouth daily. (Patient taking differently: Take 81 mg by mouth every morning. )    . atorvastatin (LIPITOR) 20 MG tablet Take 20 mg by mouth daily at 6 PM.     . carvedilol (COREG) 25 MG tablet Take 0.5 tablets (12.5 mg total) by mouth 2 (two) times daily with a meal. 60 tablet 5  . Cholecalciferol (VITAMIN D) 2000 UNITS tablet Take 2,000 Units by mouth daily.    . Cyanocobalamin (VITAMIN B-12 PO) Take 1 tablet by mouth every morning.    . fexofenadine (ALLEGRA) 180 MG tablet Take 180 mg by mouth every morning.     . fluticasone (FLONASE) 50 MCG/ACT nasal spray Place 2 sprays into the nose every morning.     . furosemide (LASIX) 20 MG tablet Take 1 tablet (20 mg total) by mouth 2 (two) times daily. Take with 80 mg to make 100mg  total 60 tablet 6  . furosemide (LASIX)  80 MG tablet Take 1 tablet (80 mg total) by mouth 2 (two) times daily. Take with  to make a total of  twice a day 60 tablet 6  . Glucosamine-Chondroit-Vit C-Mn (GLUCOSAMINE CHONDR 1500 COMPLX) CAPS Take 1 capsule by mouth every morning.     . hydrALAZINE (APRESOLINE) 25 MG tablet Take 1 tablet (25 mg total) by mouth 3 (three) times daily. 90 tablet 5  . isosorbide mononitrate (IMDUR) 30 MG 24 hr tablet Take 1 tablet (30 mg total) by mouth daily. 30 tablet 5  . L-Lysine 500 MG CAPS Take 1 capsule by mouth every morning.     . Magnesium Oxide 250 MG TABS Take 2 tablets (500 mg total) by mouth 2 (two) times daily. 120 tablet 6  . meclizine (ANTIVERT) 25 MG tablet Take 25 mg by mouth daily as needed for dizziness.     . Multiple Vitamin (MULTIVITAMIN) tablet Take 1 tablet by mouth every morning.     . nitroGLYCERIN (NITROSTAT) 0.4 MG SL tablet Place 1 tablet (0.4 mg total) under the tongue every 5 (five) minutes as needed for chest pain. (Patient taking differently: Place 0.4 mg under the tongue every 5 (five) minutes as needed for chest pain (MAX 3 TABLETS). ) 30 tablet 3  . ONE TOUCH ULTRA TEST test  strip 1 each by Other route every morning.     Letta Pate DELICA LANCETS FINE MISC 1 each by Other route every morning.     . pantoprazole (PROTONIX) 40 MG tablet Take 40 mg by mouth every morning.     . potassium chloride SA (K-DUR,KLOR-CON) 20 MEQ tablet Take 2 tablets (40 mEq total) by mouth 2 (two) times daily. 120 tablet 6  . spironolactone (ALDACTONE) 25 MG tablet Take 1 tablet (25 mg total) by mouth daily. 90 tablet 3  . warfarin (COUMADIN) 5 MG tablet as directed by Coumadin  Clinic 120 tablet 0   No current facility-administered medications for this visit.    Allergies:   Acetaminophen-codeine; Amoxicillin; and Diovan   Social History:  The patient  reports that he quit smoking about 48 years ago. His smoking use included Cigarettes. He has a 10 pack-year smoking history. He has never used smokeless tobacco. He reports that he drinks alcohol. He reports that he does not use illicit drugs.   Family History:  The patient's family history includes Colon cancer in his mother; Heart attack in his brother and mother; Leukemia in his father.    ROS:  Please see the history of present illness.   All other systems are reviewed and negative.    PHYSICAL EXAM: VS:  BP 120/74 mmHg  Pulse 81  Ht  (1.702 m)  Wt 233 lb 12.8 oz (106.051 kg)  BMI 36.61 kg/m2 , BMI Body mass index is 36.61 kg/(m^2). GEN: Well nourished, well developed, in no acute distress HEENT: normal Neck: no JVD, carotid bruits, or masses Cardiac: RRR; decreased HS, 3/6 SEM LUSB (late peaking), +1 edema  Respiratory:  clear to auscultation bilaterally, normal work of breathing GI: soft, nontender, nondistended, + BS MS: no deformity or atrophy Skin: warm and dry, device pocket is well healed Neuro:  Strength and sensation are intact Psych: euthymic mood, full affect  EKG:  EKG reviewed from 2/16 reveal sinus with narrow QRS  Device interrogation is reviewed today in detail.  See PaceArt for details.   Recent  Labs: 07/03/2014: Magnesium 2.0; TSH 0.941 07/22/2014: ALT 25 08/12/2014: Pro B Natriuretic peptide (  BNP) 1197.0* 08/25/2014: BUN 24*; Creatinine 1.34; Hemoglobin 12.7*; Platelets 141.0*; Potassium 3.7; Sodium 138    Lipid Panel     Component Value Date/Time   CHOL 113 07/22/2014 1226   CHOL 116 07/14/2013 0937   TRIG 91 07/22/2014 1226   TRIG 201.0* 07/14/2013 0937   HDL 40 07/22/2014 1226   HDL 36.10* 07/14/2013 0937   CHOLHDL 3 07/14/2013 0937   VLDL 40.2* 07/14/2013 0937   LDLCALC 55 07/22/2014 1226   LDLDIRECT 47.9 07/14/2013 0937     Wt Readings from Last 3 Encounters:  10/15/14 233 lb 12.8 oz (106.051 kg)  09/25/14 232 lb (105.235 kg)  08/25/14 230 lb 3.2 oz (104.418 kg)      Other studies Reviewed: Additional studies/ records that were reviewed today include:    Dr Malachy Mood records, echo, prior hospital records   ASSESSMENT AND PLAN:  1.  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 At this time, he meets MADIT II/ SCD-HeFT criteria for ICD implantation for primary prevention of sudden death.  He has a narrow QRS and does not meet criteria for CRT.  I discussed device upgrade to an ICD at length with him today.   He is not sure that he would like to proceed.  At this time, he would prefer to continue medical therapy.  He will discuss device upgrade with Dr Graciela Husbands (his primary EP) when he sees him again in 3 months.  He states that due to an inability to get in to see Dr Graciela Husbands in a quick fashion, he was placed on my schedule but would really prefer to have further discussions with Dr Graciela Husbands in the future. I will see as needed going forward.   Current medicines are reviewed at length with the patient today.   The patient does not have concerns regarding his medicines.  The following changes were made today:  none  Follow-up: with Dr Graciela Husbands in 3 months, carelink,  I will see as needed going forward.  Randolm Idol, MD  10/15/2014 8:56 AM       [facility] [address] [place] [address] [phone] (office) [phone] (fax)

## 2014-10-15 NOTE — Patient Instructions (Signed)
Medication Instructions:  Your physician recommends that you continue on your current medications as directed. Please refer to the Current Medication list given to you today.      Labwork: None orderd  Testing/Procedures:  None ordered  Your physician recommends that you schedule a follow-up appointment in: 3 months with Dr Graciela Husbands     Any Other Special Instructions Will Be Listed Below (If Applicable).

## 2014-10-16 ENCOUNTER — Encounter (HOSPITAL_COMMUNITY)
Admission: RE | Admit: 2014-10-16 | Discharge: 2014-10-16 | Disposition: A | Discharge status: Home / Self Care | Payer: Self-pay | Source: Ambulatory Visit | Attending: Cardiology | Admitting: Cardiology

## 2014-10-19 ENCOUNTER — Encounter (HOSPITAL_COMMUNITY)
Admission: RE | Admit: 2014-10-19 | Discharge: 2014-10-19 | Disposition: A | Discharge status: Home / Self Care | Payer: Self-pay | Source: Ambulatory Visit | Attending: Cardiology | Admitting: Cardiology

## 2014-10-20 ENCOUNTER — Encounter: Payer: Self-pay | Admitting: Cardiology

## 2014-10-20 ENCOUNTER — Encounter: Payer: Self-pay | Admitting: Internal Medicine

## 2014-10-21 ENCOUNTER — Encounter (HOSPITAL_COMMUNITY)
Admission: RE | Admit: 2014-10-21 | Discharge: 2014-10-21 | Disposition: A | Discharge status: Home / Self Care | Payer: Self-pay | Source: Ambulatory Visit | Attending: Cardiology | Admitting: Cardiology

## 2014-10-23 ENCOUNTER — Encounter (HOSPITAL_COMMUNITY)
Admission: RE | Admit: 2014-10-23 | Discharge: 2014-10-23 | Disposition: A | Discharge status: Home / Self Care | Payer: Self-pay | Source: Ambulatory Visit | Attending: Cardiology | Admitting: Cardiology

## 2014-10-28 ENCOUNTER — Encounter (HOSPITAL_COMMUNITY)
Admission: RE | Admit: 2014-10-28 | Discharge: 2014-10-28 | Disposition: A | Discharge status: Home / Self Care | Payer: Self-pay | Source: Ambulatory Visit | Attending: Cardiology | Admitting: Cardiology

## 2014-10-28 DIAGNOSIS — I5023 Acute on chronic systolic (congestive) heart failure: Secondary | ICD-10-CM | POA: Insufficient documentation

## 2014-10-30 ENCOUNTER — Ambulatory Visit (INDEPENDENT_AMBULATORY_CARE_PROVIDER_SITE_OTHER): Payer: Medicare Other

## 2014-10-30 ENCOUNTER — Encounter (HOSPITAL_COMMUNITY)
Admission: RE | Admit: 2014-10-30 | Discharge: 2014-10-30 | Disposition: A | Discharge status: Home / Self Care | Payer: Self-pay | Source: Ambulatory Visit | Attending: Cardiology | Admitting: Cardiology

## 2014-10-30 DIAGNOSIS — I480 Paroxysmal atrial fibrillation: Secondary | ICD-10-CM

## 2014-10-30 DIAGNOSIS — Z5181 Encounter for therapeutic drug level monitoring: Secondary | ICD-10-CM | POA: Diagnosis not present

## 2014-10-30 DIAGNOSIS — I4891 Unspecified atrial fibrillation: Secondary | ICD-10-CM | POA: Diagnosis not present

## 2014-10-30 LAB — POCT INR: INR: 1.8

## 2014-11-02 ENCOUNTER — Encounter (HOSPITAL_COMMUNITY)
Admission: RE | Admit: 2014-11-02 | Discharge: 2014-11-02 | Disposition: A | Discharge status: Home / Self Care | Payer: Self-pay | Source: Ambulatory Visit | Attending: Cardiology | Admitting: Cardiology

## 2014-11-04 ENCOUNTER — Encounter (HOSPITAL_COMMUNITY)
Admission: RE | Admit: 2014-11-04 | Discharge: 2014-11-04 | Disposition: A | Discharge status: Home / Self Care | Payer: Self-pay | Source: Ambulatory Visit | Attending: Cardiology | Admitting: Cardiology

## 2014-11-06 ENCOUNTER — Encounter (HOSPITAL_COMMUNITY)
Admission: RE | Admit: 2014-11-06 | Discharge: 2014-11-06 | Disposition: A | Discharge status: Home / Self Care | Payer: Self-pay | Source: Ambulatory Visit | Attending: Cardiology | Admitting: Cardiology

## 2014-11-09 ENCOUNTER — Encounter (HOSPITAL_COMMUNITY)
Admission: RE | Admit: 2014-11-09 | Discharge: 2014-11-09 | Disposition: A | Discharge status: Home / Self Care | Payer: Self-pay | Source: Ambulatory Visit | Attending: Cardiology | Admitting: Cardiology

## 2014-11-11 ENCOUNTER — Encounter (HOSPITAL_COMMUNITY): Payer: Self-pay

## 2014-11-13 ENCOUNTER — Encounter (HOSPITAL_COMMUNITY): Payer: Self-pay

## 2014-11-16 ENCOUNTER — Encounter (HOSPITAL_COMMUNITY): Payer: Self-pay

## 2014-11-18 ENCOUNTER — Encounter (HOSPITAL_COMMUNITY)
Admission: RE | Admit: 2014-11-18 | Discharge: 2014-11-18 | Disposition: A | Discharge status: Home / Self Care | Payer: Self-pay | Source: Ambulatory Visit | Attending: Cardiology | Admitting: Cardiology

## 2014-11-20 ENCOUNTER — Encounter (HOSPITAL_COMMUNITY)
Admission: RE | Admit: 2014-11-20 | Discharge: 2014-11-20 | Disposition: A | Discharge status: Home / Self Care | Payer: Self-pay | Source: Ambulatory Visit | Attending: Cardiology | Admitting: Cardiology

## 2014-11-20 ENCOUNTER — Encounter: Payer: Self-pay | Admitting: Cardiology

## 2014-11-20 ENCOUNTER — Ambulatory Visit (INDEPENDENT_AMBULATORY_CARE_PROVIDER_SITE_OTHER): Payer: Medicare Other | Admitting: Cardiology

## 2014-11-20 VITALS — BP 110/68 | HR 82 | Ht 67.0 in | Wt 235.0 lb

## 2014-11-20 DIAGNOSIS — I350 Nonrheumatic aortic (valve) stenosis: Secondary | ICD-10-CM

## 2014-11-20 DIAGNOSIS — I10 Essential (primary) hypertension: Secondary | ICD-10-CM

## 2014-11-20 DIAGNOSIS — I2510 Atherosclerotic heart disease of native coronary artery without angina pectoris: Secondary | ICD-10-CM

## 2014-11-20 DIAGNOSIS — R001 Bradycardia, unspecified: Secondary | ICD-10-CM

## 2014-11-20 DIAGNOSIS — I480 Paroxysmal atrial fibrillation: Secondary | ICD-10-CM

## 2014-11-20 DIAGNOSIS — Z9861 Coronary angioplasty status: Secondary | ICD-10-CM

## 2014-11-20 DIAGNOSIS — I5042 Chronic combined systolic (congestive) and diastolic (congestive) heart failure: Secondary | ICD-10-CM | POA: Diagnosis not present

## 2014-11-20 NOTE — Progress Notes (Signed)
Cardiology Office Note   Date:  11/20/2014   ID:  ANITA MCADORY, DOB 03-16-1941, MRN [id number]  PCP:  Gaye Alken, MD    Chief Complaint  Patient presents with  . Coronary Artery Disease    OSA  . Cardiomyopathy      History of Present Illness:  Brandon Klein is a 74 y.o. male with a hx of CAD, status post prior stenting to the LAD and balloon angioplasty to the circumflex, cath 10/2013 with severe 3 vessel ASCAD s/p CABG (LIMA-LAD, SVG to diag,SVG to circ, SVG to PDA) with normal LVF at time of cath, paroxysmal atrial fibrillation, bradycardia s/p PPM, moderate to severe aortic stenosis by recent dobutamine echo, HTN, HL. He developed acute CHF about 6 months after CABG and was found to have severe LV dysfunction. Cath 07/07/2014 showed occluded SVG to PDA, all other grafts patent. EF down felt secondary to NICM but there has been some concern regarding possible worsening AS. There was some concern that his AS was underestimated due to LV dysfunction and he underwent dobutamine echo. The aortic valve peak velocity increased from 2.53m/sec to 3.29 m/sec at peak dobutamine infusion. The mean AV gradient increased from to 37.77mmHg at peak dobutamine infusion. The AVA was calculated anywhere from 1.4cm2 at baselineto 1.15cm2 at mid dose and 1.4cm2 at high dose. All data consistent with at least moderate aortic stenosis. He underwent TEE to get a planimetered valve area which was1.14-1.4cm2 c/w moderate AS.  EF at that time was 20-25%.  He was referred to Dr. Johney Frame for consideration of prophylatic AICD implantation.  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 He does not meet criteria for CRT due to narrow QRS on EKG.  He decided not to pursue ICD at that time and wanted to wait until his appt with Dr. Graciela Husbands in August.    He presents back  today and is doing quite well.  He feels like he is back to normal.  He denies any chest pain, SOB, DOE, LE edema, dizziness, palpitations or syncope.  He is walking without any problems and loves exercising in cardiac rehab.     Past Medical History  Diagnosis Date  . Syncope     a. vasovagal with documented bradycardia.  . Pacemaker     a. 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 b. MDT generator replacement 2012.  . Obesity   . HTN (hypertension)   . HLD (hyperlipidemia)   . DJD (degenerative joint disease)   . GERD (gastroesophageal reflux disease)   . Impaired fasting glucose     A1C check every year  . PAF (paroxysmal atrial fibrillation)     on chronic systemic anticoagulation  . Coronary artery disease     a. s/p PCI of LAD 1997. b. Cutting balloon angioplasty to LCx in 2002. c. s/p cath 11/2013 with severe 3 vessel ASCAD s/p CABG with LIMA to LAD, SVG to diag, SVG to left circ and SVG to PDA. d. cath 07/07/2014 occluded SVG to PDA, all other grafts patent. 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 AS (aortic stenosis)     mild by echo 10/2013  . Hypokalemia   . Hypomagnesemia   . Carotid artery plaque     a. Duplex 10/2013: mild  calcific plaque origin ICA. Left: intimal wall thickening CCA. 0-39% BICA.  Marland Kitchen PVC's (premature ventricular contractions)   . OSA (obstructive sleep apnea)   . Chronic systolic dysfunction of left ventricle   . GI bleeding     a.  with benign gastric polypectomy 05/2014.  Marland Kitchen Epistaxis   . Chronic renal insufficiency     Past Surgical History  Procedure Laterality Date  . Cholecystectomy    . Knee arthroscopy  2000    Left  . Angioplasty  1997  . Pacemaker insertion  2002    generator change MDT by Dr Graciela Husbands in 2012  . Coronary artery bypass graft N/A 12/01/2013    Procedure: CORONARY ARTERY BYPASS GRAFT TIMES FOUR USING LEFT INTERNAL MAMMARY ARTERY TO LAD, SAPHENOUS VEIN GRAFTS TO DIAGONAL, CIRCUMFELX AND POSTERIOR DESCENDING;  Surgeon: Kerin Perna, MD;  Location: [facility] OR;  Service: Open Heart Surgery;  Laterality: N/A;  . Esophagogastroduodenoscopy (egd) with propofol N/A 05/05/2014    Procedure: ESOPHAGOGASTRODUODENOSCOPY (EGD) WITH PROPOFOL;  Surgeon: Charolett Bumpers, MD;  Location: [facility] ENDOSCOPY;  Service: Endoscopy;  Laterality: N/A;  . Colonoscopy with propofol N/A 05/05/2014    Procedure: COLONOSCOPY WITH PROPOFOL;  Surgeon: Charolett Bumpers, MD;  Location: [facility] ENDOSCOPY;  Service: Endoscopy;  Laterality: N/A;  . Left heart catheterization with coronary angiogram N/A 11/25/2013    Procedure: LEFT HEART CATHETERIZATION WITH CORONARY ANGIOGRAM;  Surgeon: [name], MD;  Location: [facility] CATH LAB;  Service: Cardiovascular;  Laterality: N/A;  . Left and right heart catheterization with coronary/graft angiogram N/A 07/07/2014    Procedure: LEFT AND RIGHT HEART CATHETERIZATION WITH Isabel Caprice;  Surgeon: Kathleene Hazel, MD;  Location: [facility] CATH LAB;  Service: Cardiovascular;  Laterality: N/A;  . Tee without cardioversion N/A 08/31/2014    Procedure: TRANSESOPHAGEAL ECHOCARDIOGRAM (TEE);  Surgeon: [name], MD;  Location: [facility] ENDOSCOPY;  Service: Cardiovascular;  Laterality: N/A;     Current Outpatient Prescriptions  Medication Sig Dispense Refill  . aspirin EC 81 MG EC tablet Take 1 tablet (81 mg total) by mouth daily. (Patient taking differently: Take 81 mg by mouth every morning. )    . atorvastatin (LIPITOR) 20 MG tablet Take 20 mg by mouth daily at 6 PM.     . carvedilol (COREG) 25 MG tablet Take 0.5 tablets (12.5 mg total) by mouth 2 (two) times daily with a meal. 60 tablet 5  . Cholecalciferol (VITAMIN D) 2000 UNITS tablet Take 2,000 Units by mouth daily.    . Cyanocobalamin (VITAMIN B-12 PO) Take 1 tablet by mouth every morning.    . fexofenadine (ALLEGRA) 180 MG tablet Take 180 mg by mouth every morning.     . fluticasone (FLONASE) 50 MCG/ACT nasal spray Place 2 sprays into the nose every morning.     .  furosemide (LASIX) 20 MG tablet Take 1 tablet (20 mg total) by mouth 2 (two) times daily. Take with 80 mg to make 100mg  total 60 tablet 6  . furosemide (LASIX) 80 MG tablet Take 1 tablet (80 mg total) by mouth 2 (two) times daily. Take with 20mg  to make a total of 100mg  twice a day 60 tablet 6  . Glucosamine-Chondroit-Vit C-Mn (GLUCOSAMINE CHONDR 1500 COMPLX) CAPS Take 1 capsule by mouth every morning.     . hydrALAZINE (APRESOLINE) 25 MG tablet Take 1 tablet (25 mg total) by mouth 3 (three) times daily. 90 tablet 5  . isosorbide mononitrate (IMDUR) 30 MG 24 hr tablet Take 1  tablet (30 mg total) by mouth daily. 30 tablet 5  . L-Lysine 500 MG CAPS Take 1 capsule by mouth every morning.     . Magnesium Oxide 250 MG TABS Take 2 tablets (500 mg total) by mouth 2 (two) times daily. 120 tablet 6  . meclizine (ANTIVERT) 25 MG tablet Take 25 mg by mouth daily as needed for dizziness.     . Multiple Vitamin (MULTIVITAMIN) tablet Take 1 tablet by mouth every morning.     . nitroGLYCERIN (NITROSTAT) 0.4 MG SL tablet Place 1 tablet (0.4 mg total) under the tongue every 5 (five) minutes as needed for chest pain. (Patient taking differently: Place 0.4 mg under the tongue every 5 (five) minutes as needed for chest pain (MAX 3 TABLETS). ) 30 tablet 3  . ONE TOUCH ULTRA TEST test strip 1 each by Other route every morning.     Letta Pate DELICA LANCETS FINE MISC 1 each by Other route every morning.     . pantoprazole (PROTONIX) 40 MG tablet Take 40 mg by mouth every morning.     . potassium chloride SA (K-DUR,KLOR-CON) 20 MEQ tablet Take 2 tablets (40 mEq total) by mouth 2 (two) times daily. 120 tablet 6  . spironolactone (ALDACTONE) 25 MG tablet Take 1 tablet (25 mg total) by mouth daily. 90 tablet 3  . warfarin (COUMADIN) 5 MG tablet as directed by Coumadin  Clinic 120 tablet 0   No current facility-administered medications for this visit.    Allergies:   Acetaminophen-codeine; Amoxicillin; and Diovan     Social History:  The patient  reports that he quit smoking about 48 years ago. His smoking use included Cigarettes. He has a 10 pack-year smoking history. He has never used smokeless tobacco. He reports that he drinks alcohol. He reports that he does not use illicit drugs.   Family History:  The patient's family history includes Colon cancer in his mother; Heart attack in his brother and mother; Leukemia in his father.    ROS:  Please see the history of present illness.   Otherwise, review of systems are positive for none.   All other systems are reviewed and negative.    PHYSICAL EXAM: VS:  BP 110/68 mmHg  Pulse 82  Ht  (1.702 m)  Wt 235 lb (106.595 kg)  BMI 36.80 kg/m2  SpO2 96% , BMI Body mass index is 36.8 kg/(m^2). GEN: Well nourished, well developed, in no acute distress HEENT: normal Neck: no JVD, carotid bruits, or masses Cardiac: RRR; no murmurs, rubs, or gallops,no edema  Respiratory:  clear to auscultation bilaterally, normal work of breathing GI: soft, nontender, nondistended, + BS MS: no deformity or atrophy Skin: warm and dry, no rash Neuro:  Strength and sensation are intact Psych: euthymic mood, full affect   EKG:  EKG is not ordered today.    Recent Labs: 07/03/2014: Magnesium 2.0; TSH 0.941 07/22/2014: ALT 25 08/12/2014: Pro B Natriuretic peptide (BNP) 1197.0* 08/25/2014: BUN 24*; Creatinine, Ser 1.34; Hemoglobin 12.7*; Platelets 141.0*; Potassium 3.7; Sodium 138    Lipid Panel    Component Value Date/Time   CHOL 113 07/22/2014 1226   CHOL 116 07/14/2013 0937   TRIG 91 07/22/2014 1226   TRIG 201.0* 07/14/2013 0937   HDL 40 07/22/2014 1226   HDL 36.10* 07/14/2013 0937   CHOLHDL 3 07/14/2013 0937   VLDL 40.2* 07/14/2013 0937   LDLCALC 55 07/22/2014 1226   LDLDIRECT 47.9 07/14/2013 0937      Wt  Readings from Last 3 Encounters:  11/20/14 235 lb (106.595 kg)  10/15/14 233 lb 12.8 oz (106.051 kg)  09/25/14 232 lb (105.235 kg)    ASSESSMENT  AND PLAN:  1. Shortness of breath - this has resolved .  2. Chronic combined systolic/diastolic CHF class II: right and left heart cath showed severe 3 vessel ASCAD with 3/4 grafts patent (SVG to PDA ocluded) and SVG to OM with 90% stenosis prox to the graft insertion, ischemic DCM and moderate AS. PCW was 18. Continue BB/Hydralazine/nitrates. Not on ACE I due to renal insuff. Continue Lasix to 100mg  BID and aldactone 25mg  daily.  Repeat echo showed persistently reduced EF at 25-30%.  Seen by EP but wants to hold off on AICD upgrade until seen by his regular EP MD Dr. Graciela Husbands.  I again stressed the importance of proceeding with upgrade of  PPM to AICD to prevent SCD.    3. CAD s/p CABG 12/02/13 with 3/4 grafts patent by recent cath (occluded SVG to PDA and SVG to OM patent with 90% stenosis proximal to the graft insertion in the OM with patent flow distally).  He has no angina.  Continue ASA/statin/ beta blocker.  4. PAF (paroxysmal atrial fibrillation): Maintaining NSR. Continue Coumadin/BB.   5. Moderate aortic stenosis by recent echo - Mean gradient was and prior to CABG was . AS by recent cath was moderate with peak to peak of . Dobutamine echo showed moderate AS with aortic valve peak velocity increased from 2.38m/sec to 3.29 m/sec at peak dobutamine infusion. The mean AV gradient increased from to 37.62mmHg at peak dobutamine infusion. The AVA was calculated anywhere from 1.4cm2 at baselineto 1.15cm2 at mid dose and 1.4cm2 at high dose. All data consistent with at least moderate aortic stenosis but mean gradient at peak dobutamine nearing severe range. TEE confirmed moderate AS with AVA 1.14-1.4cm2 planimetered.    6. Essential hypertension: controlled. Continue BB/Hydralazine  7. Dyslipidemia: LDL at goal at 42. Continue statin.   8. Bradycardia s/p MDT pacemaker: Follow up with the Dr. Graciela Husbands with EP as planned to discuss further the  recommendation for upgrade to AICD   9. OSA s/p BiPAP titration to 20/16cm H2O        Current medicines are reviewed at length with the patient today.  The patient does not have concerns regarding medicines.  The following changes have been made:  no change  Labs/ tests ordered today: See above Assessment and Plan No orders of the defined types were placed in this encounter.     Disposition:   FU with me in 6 months  Signed, [name], MD  11/20/2014 3:23 PM    [facility] [address] [place], [place], [place]  [address] Phone: [phone]; Fax: [phone]

## 2014-11-20 NOTE — Patient Instructions (Signed)

## 2014-11-23 ENCOUNTER — Encounter (HOSPITAL_COMMUNITY): Payer: Self-pay

## 2014-11-25 ENCOUNTER — Encounter (HOSPITAL_COMMUNITY)
Admission: RE | Admit: 2014-11-25 | Discharge: 2014-11-25 | Disposition: A | Discharge status: Home / Self Care | Payer: Self-pay | Source: Ambulatory Visit | Attending: Cardiology | Admitting: Cardiology

## 2014-11-27 ENCOUNTER — Other Ambulatory Visit: Payer: Self-pay

## 2014-11-27 ENCOUNTER — Encounter (HOSPITAL_COMMUNITY)
Admission: RE | Admit: 2014-11-27 | Discharge: 2014-11-27 | Disposition: A | Discharge status: Home / Self Care | Payer: Self-pay | Source: Ambulatory Visit | Attending: Cardiology | Admitting: Cardiology

## 2014-11-27 ENCOUNTER — Ambulatory Visit (INDEPENDENT_AMBULATORY_CARE_PROVIDER_SITE_OTHER): Payer: Medicare Other | Admitting: *Deleted

## 2014-11-27 DIAGNOSIS — I4891 Unspecified atrial fibrillation: Secondary | ICD-10-CM

## 2014-11-27 DIAGNOSIS — I5032 Chronic diastolic (congestive) heart failure: Secondary | ICD-10-CM

## 2014-11-27 DIAGNOSIS — Z5181 Encounter for therapeutic drug level monitoring: Secondary | ICD-10-CM

## 2014-11-27 DIAGNOSIS — I480 Paroxysmal atrial fibrillation: Secondary | ICD-10-CM

## 2014-11-27 DIAGNOSIS — I5023 Acute on chronic systolic (congestive) heart failure: Secondary | ICD-10-CM | POA: Insufficient documentation

## 2014-11-27 LAB — POCT INR: INR: 2.1

## 2014-11-27 MED ORDER — WARFARIN SODIUM 5 MG PO TABS: ORAL_TABLET | ORAL | Status: DC

## 2014-11-27 MED ORDER — MAGNESIUM OXIDE 250 MG PO TABS: 500.0000 mg | ORAL_TABLET | Freq: Two times a day (BID) | ORAL | Status: DC

## 2014-12-10 ENCOUNTER — Other Ambulatory Visit: Payer: Self-pay

## 2014-12-10 DIAGNOSIS — I5032 Chronic diastolic (congestive) heart failure: Secondary | ICD-10-CM

## 2014-12-10 MED ORDER — MAGNESIUM OXIDE 250 MG PO TABS: 500.0000 mg | ORAL_TABLET | Freq: Two times a day (BID) | ORAL | Status: DC

## 2014-12-11 ENCOUNTER — Ambulatory Visit (INDEPENDENT_AMBULATORY_CARE_PROVIDER_SITE_OTHER): Payer: Medicare Other | Admitting: *Deleted

## 2014-12-11 ENCOUNTER — Encounter (HOSPITAL_COMMUNITY)
Admission: RE | Admit: 2014-12-11 | Discharge: 2014-12-11 | Disposition: A | Discharge status: Home / Self Care | Payer: Self-pay | Source: Ambulatory Visit | Attending: Cardiology | Admitting: Cardiology

## 2014-12-11 DIAGNOSIS — I4891 Unspecified atrial fibrillation: Secondary | ICD-10-CM | POA: Diagnosis not present

## 2014-12-14 ENCOUNTER — Encounter (HOSPITAL_COMMUNITY)
Admission: RE | Admit: 2014-12-14 | Discharge: 2014-12-14 | Disposition: A | Discharge status: Home / Self Care | Payer: Self-pay | Source: Ambulatory Visit | Attending: Cardiology | Admitting: Cardiology

## 2014-12-14 NOTE — Progress Notes (Signed)
Remote pacemaker transmission.   

## 2014-12-16 ENCOUNTER — Encounter (HOSPITAL_COMMUNITY)
Admission: RE | Admit: 2014-12-16 | Discharge: 2014-12-16 | Disposition: A | Discharge status: Home / Self Care | Payer: Self-pay | Source: Ambulatory Visit | Attending: Cardiology | Admitting: Cardiology

## 2014-12-16 LAB — CUP PACEART REMOTE DEVICE CHECK
Battery Impedance: 183 Ohm
Battery Remaining Longevity: 130 mo
Brady Statistic AP VP Percent: 3 %
Brady Statistic AS VP Percent: 2 %
Brady Statistic AS VS Percent: 49 %
Lead Channel Impedance Value: 474 Ohm
Lead Channel Impedance Value: 854 Ohm
Lead Channel Pacing Threshold Pulse Width: 0.4 ms
Lead Channel Sensing Intrinsic Amplitude: 1.4 mV
Lead Channel Setting Pacing Amplitude: 2.5 V
Lead Channel Setting Pacing Pulse Width: 0.4 ms
Lead Channel Setting Sensing Sensitivity: 5.6 mV
MDC IDC MSMT BATTERY VOLTAGE: 2.79 V
MDC IDC MSMT LEADCHNL RA PACING THRESHOLD AMPLITUDE: 0.375 V
MDC IDC MSMT LEADCHNL RV PACING THRESHOLD AMPLITUDE: 0.875 V
MDC IDC MSMT LEADCHNL RV PACING THRESHOLD PULSEWIDTH: 0.4 ms
MDC IDC MSMT LEADCHNL RV SENSING INTR AMPL: 16 mV
MDC IDC SESS DTM: 20160715135854
MDC IDC SET LEADCHNL RA PACING AMPLITUDE: 2 V
MDC IDC STAT BRADY AP VS PERCENT: 46 %

## 2014-12-18 ENCOUNTER — Encounter (HOSPITAL_COMMUNITY)
Admission: RE | Admit: 2014-12-18 | Discharge: 2014-12-18 | Disposition: A | Discharge status: Home / Self Care | Payer: Self-pay | Source: Ambulatory Visit | Attending: Cardiology | Admitting: Cardiology

## 2014-12-21 ENCOUNTER — Encounter (HOSPITAL_COMMUNITY)
Admission: RE | Admit: 2014-12-21 | Discharge: 2014-12-21 | Disposition: A | Discharge status: Home / Self Care | Payer: Self-pay | Source: Ambulatory Visit | Attending: Cardiology | Admitting: Cardiology

## 2014-12-23 ENCOUNTER — Encounter (HOSPITAL_COMMUNITY)
Admission: RE | Admit: 2014-12-23 | Discharge: 2014-12-23 | Disposition: A | Discharge status: Home / Self Care | Payer: Self-pay | Source: Ambulatory Visit | Attending: Cardiology | Admitting: Cardiology

## 2014-12-25 ENCOUNTER — Encounter (HOSPITAL_COMMUNITY)
Admission: RE | Admit: 2014-12-25 | Discharge: 2014-12-25 | Disposition: A | Discharge status: Home / Self Care | Payer: Self-pay | Source: Ambulatory Visit | Attending: Cardiology | Admitting: Cardiology

## 2014-12-25 ENCOUNTER — Ambulatory Visit (INDEPENDENT_AMBULATORY_CARE_PROVIDER_SITE_OTHER): Payer: Medicare Other

## 2014-12-25 DIAGNOSIS — I4891 Unspecified atrial fibrillation: Secondary | ICD-10-CM | POA: Diagnosis not present

## 2014-12-25 DIAGNOSIS — Z5181 Encounter for therapeutic drug level monitoring: Secondary | ICD-10-CM | POA: Diagnosis not present

## 2014-12-25 DIAGNOSIS — I480 Paroxysmal atrial fibrillation: Secondary | ICD-10-CM

## 2014-12-25 LAB — POCT INR: INR: 2.3

## 2014-12-28 ENCOUNTER — Encounter (HOSPITAL_COMMUNITY)
Admission: RE | Admit: 2014-12-28 | Discharge: 2014-12-28 | Disposition: A | Discharge status: Home / Self Care | Payer: Self-pay | Source: Ambulatory Visit | Attending: Cardiology | Admitting: Cardiology

## 2014-12-28 DIAGNOSIS — I5023 Acute on chronic systolic (congestive) heart failure: Secondary | ICD-10-CM | POA: Insufficient documentation

## 2014-12-30 ENCOUNTER — Encounter (HOSPITAL_COMMUNITY)
Admission: RE | Admit: 2014-12-30 | Discharge: 2014-12-30 | Disposition: A | Discharge status: Home / Self Care | Payer: Self-pay | Source: Ambulatory Visit | Attending: Cardiology | Admitting: Cardiology

## 2014-12-30 ENCOUNTER — Other Ambulatory Visit: Payer: Self-pay

## 2014-12-30 MED ORDER — HYDRALAZINE HCL 25 MG PO TABS: 25.0000 mg | ORAL_TABLET | Freq: Three times a day (TID) | ORAL | Status: DC

## 2015-01-01 ENCOUNTER — Encounter (HOSPITAL_COMMUNITY)
Admission: RE | Admit: 2015-01-01 | Discharge: 2015-01-01 | Disposition: A | Discharge status: Home / Self Care | Payer: Self-pay | Source: Ambulatory Visit | Attending: Cardiology | Admitting: Cardiology

## 2015-01-04 ENCOUNTER — Encounter (HOSPITAL_COMMUNITY)
Admission: RE | Admit: 2015-01-04 | Discharge: 2015-01-04 | Disposition: A | Discharge status: Home / Self Care | Payer: Self-pay | Source: Ambulatory Visit | Attending: Cardiology | Admitting: Cardiology

## 2015-01-06 ENCOUNTER — Encounter (HOSPITAL_COMMUNITY)
Admission: RE | Admit: 2015-01-06 | Discharge: 2015-01-06 | Disposition: A | Discharge status: Home / Self Care | Payer: Self-pay | Source: Ambulatory Visit | Attending: Cardiology | Admitting: Cardiology

## 2015-01-06 ENCOUNTER — Encounter: Payer: Self-pay | Admitting: Cardiology

## 2015-01-08 ENCOUNTER — Encounter (HOSPITAL_COMMUNITY)
Admission: RE | Admit: 2015-01-08 | Discharge: 2015-01-08 | Disposition: A | Discharge status: Home / Self Care | Payer: Self-pay | Source: Ambulatory Visit | Attending: Cardiology | Admitting: Cardiology

## 2015-01-11 ENCOUNTER — Encounter: Payer: Self-pay | Admitting: Internal Medicine

## 2015-01-11 ENCOUNTER — Encounter (HOSPITAL_COMMUNITY)
Admission: RE | Admit: 2015-01-11 | Discharge: 2015-01-11 | Disposition: A | Discharge status: Home / Self Care | Payer: Self-pay | Source: Ambulatory Visit | Attending: Cardiology | Admitting: Cardiology

## 2015-01-13 ENCOUNTER — Encounter (HOSPITAL_COMMUNITY)
Admission: RE | Admit: 2015-01-13 | Discharge: 2015-01-13 | Disposition: A | Discharge status: Home / Self Care | Payer: Self-pay | Source: Ambulatory Visit | Attending: Cardiology | Admitting: Cardiology

## 2015-01-15 ENCOUNTER — Encounter (HOSPITAL_COMMUNITY): Payer: Self-pay

## 2015-01-18 ENCOUNTER — Encounter (HOSPITAL_COMMUNITY): Payer: Self-pay

## 2015-01-20 ENCOUNTER — Encounter (HOSPITAL_COMMUNITY): Payer: Self-pay

## 2015-01-22 ENCOUNTER — Encounter (HOSPITAL_COMMUNITY): Payer: Self-pay

## 2015-01-25 ENCOUNTER — Encounter (HOSPITAL_COMMUNITY)
Admission: RE | Admit: 2015-01-25 | Discharge: 2015-01-25 | Disposition: A | Discharge status: Home / Self Care | Payer: Self-pay | Source: Ambulatory Visit | Attending: Cardiology | Admitting: Cardiology

## 2015-01-26 ENCOUNTER — Ambulatory Visit (INDEPENDENT_AMBULATORY_CARE_PROVIDER_SITE_OTHER): Payer: Medicare Other | Admitting: Internal Medicine

## 2015-01-26 ENCOUNTER — Ambulatory Visit (INDEPENDENT_AMBULATORY_CARE_PROVIDER_SITE_OTHER): Payer: Medicare Other | Admitting: *Deleted

## 2015-01-26 ENCOUNTER — Encounter: Payer: Self-pay | Admitting: Internal Medicine

## 2015-01-26 ENCOUNTER — Telehealth: Payer: Self-pay | Admitting: *Deleted

## 2015-01-26 VITALS — BP 102/68 | HR 80 | Ht 67.0 in | Wt 240.8 lb

## 2015-01-26 DIAGNOSIS — Z45018 Encounter for adjustment and management of other part of cardiac pacemaker: Secondary | ICD-10-CM

## 2015-01-26 DIAGNOSIS — I480 Paroxysmal atrial fibrillation: Secondary | ICD-10-CM

## 2015-01-26 DIAGNOSIS — I4891 Unspecified atrial fibrillation: Secondary | ICD-10-CM | POA: Diagnosis not present

## 2015-01-26 DIAGNOSIS — I5042 Chronic combined systolic (congestive) and diastolic (congestive) heart failure: Secondary | ICD-10-CM | POA: Diagnosis not present

## 2015-01-26 DIAGNOSIS — Z79899 Other long term (current) drug therapy: Secondary | ICD-10-CM

## 2015-01-26 DIAGNOSIS — Z5181 Encounter for therapeutic drug level monitoring: Secondary | ICD-10-CM

## 2015-01-26 DIAGNOSIS — I495 Sick sinus syndrome: Secondary | ICD-10-CM

## 2015-01-26 DIAGNOSIS — I519 Heart disease, unspecified: Secondary | ICD-10-CM

## 2015-01-26 LAB — CUP PACEART INCLINIC DEVICE CHECK
Battery Voltage: 2.79 V
Brady Statistic AP VS Percent: 42 %
Brady Statistic AS VP Percent: 1 %
Lead Channel Impedance Value: 496 Ohm
Lead Channel Impedance Value: 825 Ohm
Lead Channel Pacing Threshold Pulse Width: 0.4 ms
Lead Channel Pacing Threshold Pulse Width: 0.4 ms
Lead Channel Setting Pacing Amplitude: 2 V
Lead Channel Setting Pacing Pulse Width: 0.4 ms
Lead Channel Setting Sensing Sensitivity: 5.6 mV
MDC IDC MSMT BATTERY IMPEDANCE: 159 Ohm
MDC IDC MSMT BATTERY REMAINING LONGEVITY: 137 mo
MDC IDC MSMT LEADCHNL RA PACING THRESHOLD AMPLITUDE: 0.75 V
MDC IDC MSMT LEADCHNL RA SENSING INTR AMPL: 2 mV
MDC IDC MSMT LEADCHNL RV PACING THRESHOLD AMPLITUDE: 1 V
MDC IDC MSMT LEADCHNL RV SENSING INTR AMPL: 22.4 mV
MDC IDC SESS DTM: 20160830174120
MDC IDC SET LEADCHNL RV PACING AMPLITUDE: 2.5 V
MDC IDC STAT BRADY AP VP PERCENT: 1 %
MDC IDC STAT BRADY AS VS PERCENT: 55 %

## 2015-01-26 LAB — BASIC METABOLIC PANEL
BUN: 29 mg/dL — AB (ref 6–23)
CHLORIDE: 100 meq/L (ref 96–112)
CO2: 28 mEq/L (ref 19–32)
Calcium: 9.6 mg/dL (ref 8.4–10.5)
Creatinine, Ser: 1.53 mg/dL — ABNORMAL HIGH (ref 0.40–1.50)
GFR: 47.51 mL/min — AB (ref >60.00)
Glucose, Bld: 102 mg/dL — ABNORMAL HIGH (ref 70–99)
POTASSIUM: 4.5 meq/L (ref 3.5–5.1)
Sodium: 136 mEq/L (ref 135–145)

## 2015-01-26 LAB — POCT INR: INR: 1.9

## 2015-01-26 MED ORDER — ISOSORBIDE MONONITRATE ER 30 MG PO TB24: 30.0000 mg | ORAL_TABLET | Freq: Every day | ORAL | Status: DC

## 2015-01-26 NOTE — Telephone Encounter (Signed)
FYI

## 2015-01-26 NOTE — Telephone Encounter (Signed)
Refills sent in per Dr. [name].

## 2015-01-26 NOTE — Progress Notes (Signed)
Patient Care Team: Juluis Rainier, MD as PCP - General (Family Medicine) [name], MD as Consulting Physician (Cardiology) Duke Salvia, MD as Consulting Physician (Clinical Cardiac Electrophysiology)   HPI  Brandon Klein is a 74 y.o. male Seen in followup for pacemaker implanted in 2002 with generator replacement 2012. He was implanted initially for bradycardia attributed to vasovagal syncope.  He has a history of coronary disease and underwent stenting of his LAD 1997 and cutting balloon angioplasty of the circumflex in 2002. Echocardiogram 2013 demonstrated normal left ventricular function. Myoview 2008 with nonischemic He was admitted to/16 with acute on chronic heart failure. Echocardiogram 1/16 demonstrated an ejection fraction of 20%. He underwent catheterization which demonstrated patent vein graft to the OM the diagonal and the LIMA. His vein graft to the PDA was occluded.  Echocardiogram demonstrated aortic stenosis which by TEE in dobutamine was felt to be only moderate. Mean gradient was 20. With dobutamine increased to 37 mm. Repeat ejection fraction 3 months following initial hospitalization was 25-30%.  He saw Dr. Johney Frame 5/16. A narrow QRS was identified, as such, he did not meet criteria for CRT.  Since his hospitalization, his functional status has returned towards normal. He denies nocturnal dyspnea orthopnea or peripheral edema or chest discomfort. He has had no syncope.  He has a history of PAF. He is on Coumadin.   His lipids were reviewed he has low-density particles. His LDL was 35. I have reviewed this with Dr. Florina Ou and Alfonse Ras.    He denies snoring as does have some daytime somnolence  Past Medical History  Diagnosis Date  . Syncope     a. vasovagal with documented bradycardia.  . Pacemaker     a. 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 b. MDT generator replacement 2012.  . Obesity   . HTN (hypertension)   . HLD  (hyperlipidemia)   . DJD (degenerative joint disease)   . GERD (gastroesophageal reflux disease)   . Impaired fasting glucose     A1C check every year  . PAF (paroxysmal atrial fibrillation)     on chronic systemic anticoagulation  . Coronary artery disease     a. s/p PCI of LAD 1997. b. Cutting balloon angioplasty to LCx in 2002. c. s/p cath 11/2013 with severe 3 vessel ASCAD s/p CABG with LIMA to LAD, SVG to diag, SVG to left circ and SVG to PDA. d. cath 07/07/2014 occluded SVG to PDA, all other grafts patent. 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 AS (aortic stenosis)     mild by echo 10/2013  . Hypokalemia   . Hypomagnesemia   . Carotid artery plaque     a. Duplex 10/2013: mild calcific plaque origin ICA. Left: intimal wall thickening CCA. 0-39% BICA.  Marland Kitchen PVC's (premature ventricular contractions)   . OSA (obstructive sleep apnea)   . Chronic systolic dysfunction of left ventricle   . GI bleeding     a.  with benign gastric polypectomy 05/2014.  Marland Kitchen Epistaxis   . Chronic renal insufficiency     Past Surgical History  Procedure Laterality Date  . Cholecystectomy    . Knee arthroscopy  2000    Left  . Angioplasty  1997  . Pacemaker insertion  2002    generator change MDT by Dr Graciela Husbands in 2012  . Coronary artery bypass graft N/A 12/01/2013    Procedure: CORONARY ARTERY BYPASS GRAFT TIMES FOUR USING LEFT INTERNAL MAMMARY ARTERY  TO LAD, SAPHENOUS VEIN GRAFTS TO DIAGONAL, CIRCUMFELX AND POSTERIOR DESCENDING;  Surgeon: Kerin Perna, MD;  Location: [facility] OR;  Service: Open Heart Surgery;  Laterality: N/A;  . Esophagogastroduodenoscopy (egd) with propofol N/A 05/05/2014    Procedure: ESOPHAGOGASTRODUODENOSCOPY (EGD) WITH PROPOFOL;  Surgeon: Charolett Bumpers, MD;  Location: [facility] ENDOSCOPY;  Service: Endoscopy;  Laterality: N/A;  . Colonoscopy with propofol N/A 05/05/2014    Procedure: COLONOSCOPY WITH PROPOFOL;  Surgeon: Charolett Bumpers, MD;  Location: [facility] ENDOSCOPY;  Service: Endoscopy;  Laterality: N/A;  . Left  heart catheterization with coronary angiogram N/A 11/25/2013    Procedure: LEFT HEART CATHETERIZATION WITH CORONARY ANGIOGRAM;  Surgeon: [name], MD;  Location: [facility] CATH LAB;  Service: Cardiovascular;  Laterality: N/A;  . Left and right heart catheterization with coronary/graft angiogram N/A 07/07/2014    Procedure: LEFT AND RIGHT HEART CATHETERIZATION WITH Isabel Caprice;  Surgeon: Kathleene Hazel, MD;  Location: [facility] CATH LAB;  Service: Cardiovascular;  Laterality: N/A;  . Tee without cardioversion N/A 08/31/2014    Procedure: TRANSESOPHAGEAL ECHOCARDIOGRAM (TEE);  Surgeon: [name], MD;  Location: [facility] ENDOSCOPY;  Service: Cardiovascular;  Laterality: N/A;    Current Outpatient Prescriptions  Medication Sig Dispense Refill  . aspirin EC 81 MG EC tablet Take 1 tablet (81 mg total) by mouth daily. (Patient taking differently: Take 81 mg by mouth every morning. )    . atorvastatin (LIPITOR) 20 MG tablet Take 20 mg by mouth daily at 6 PM.     . carvedilol (COREG) 25 MG tablet Take 0.5 tablets (12.5 mg total) by mouth 2 (two) times daily with a meal. 60 tablet 5  . Cholecalciferol (VITAMIN D) 2000 UNITS tablet Take 2,000 Units by mouth daily.    . Cyanocobalamin (VITAMIN B-12 PO) Take 1 tablet by mouth every morning.    . fexofenadine (ALLEGRA) 180 MG tablet Take 180 mg by mouth every morning.     . fluticasone (FLONASE) 50 MCG/ACT nasal spray Place 2 sprays into the nose every morning.     . furosemide (LASIX) 20 MG tablet Take 1 tablet (20 mg total) by mouth 2 (two) times daily. Take with 80 mg to make 100mg  total 60 tablet 6  . furosemide (LASIX) 80 MG tablet Take 1 tablet (80 mg total) by mouth 2 (two) times daily. Take with 20mg  to make a total of 100mg  twice a day 60 tablet 6  . Glucosamine-Chondroit-Vit C-Mn (GLUCOSAMINE CHONDR 1500 COMPLX) CAPS Take 1 capsule by mouth every morning.     . hydrALAZINE (APRESOLINE) 25 MG tablet Take 1 tablet (25 mg total) by mouth 3  (three) times daily. 90 tablet 5  . L-Lysine 500 MG CAPS Take 1 capsule by mouth every morning.     . Magnesium Oxide 250 MG TABS Take 2 tablets (500 mg total) by mouth 2 (two) times daily. 120 tablet 6  . meclizine (ANTIVERT) 25 MG tablet Take 25 mg by mouth daily as needed for dizziness.     . Multiple Vitamin (MULTIVITAMIN) tablet Take 1 tablet by mouth every morning.     . nitroGLYCERIN (NITROSTAT) 0.4 MG SL tablet Place 1 tablet (0.4 mg total) under the tongue every 5 (five) minutes as needed for chest pain. (Patient taking differently: Place 0.4 mg under the tongue every 5 (five) minutes as needed for chest pain (MAX 3 TABLETS). ) 30 tablet 3  . ONE TOUCH ULTRA TEST test strip 1 each by Other route every morning.     Marland Kitchen  ONETOUCH DELICA LANCETS FINE MISC 1 each by Other route every morning.     . pantoprazole (PROTONIX) 40 MG tablet Take 40 mg by mouth every morning.     . potassium chloride SA (K-DUR,KLOR-CON) 20 MEQ tablet Take 2 tablets (40 mEq total) by mouth 2 (two) times daily. 120 tablet 6  . spironolactone (ALDACTONE) 25 MG tablet Take 1 tablet (25 mg total) by mouth daily. 90 tablet 3  . warfarin (COUMADIN) 5 MG tablet Take as directed by coumadin clinic 120 tablet 0  . isosorbide mononitrate (IMDUR) 30 MG 24 hr tablet Take 1 tablet (30 mg total) by mouth daily. 30 tablet 11   No current facility-administered medications for this visit.    Allergies  Allergen Reactions  . Acetaminophen-Codeine Other (See Comments)    Extreme constipation   . Amoxicillin Itching and Rash    Unknown-maybe a rash?  . Diovan [Valsartan] Other (See Comments)    Unknown     Review of Systems negative except from HPI and PMH  Physical Exam BP 102/68 mmHg  Pulse 80  Ht 5\' 7"  (1.702 m)  Wt 240 lb 12.8 oz (109.226 kg)  BMI 37.71 kg/m2 Well developed and nourished in no acute distress HENT normal Neck supple with JVP-flat Clear Regular rate and rhythm, no murmurs or gallops Abd-soft with  active BS No Clubbing cyanosis edema Skin-warm and dry A & Oriented  Grossly normal sensory and motor function  ECG was ordered today and demonstrated atrial pacing at 80 intervals 15/11/39 axis left at -45  Assessment and  Plan  Atrial fibrillation  Coronary artery disease with prior PCI and bypass surgery 2015  Pacemaker -[employer]tronic   The patient's device was interrogated.  The information was reviewed. No changes were made in the programming.     Congestive heart failure-chronic-class II  Cardiomyopathy question mechanism with concomitant coronary disease  Aortic stenosis-moderate  Sinus node dysfunction  The patient has progressive LV dysfunction which has stabilized over recent months. His congestive symptoms are currently class II. We will plan to reassess left ventricular function. I discussed this with Dr. Florina Ou  we have opted to pursue this by echocardiogram..  He is probably not a candidate for Entresto giving the unclear allergy to Diovan. High risk medication surveillance is indicated given his Aldactone. Last potassium check 3/16 3.7 with a creatinine of 1.34. Without symptoms of ischemia  Euvolemic continue current meds

## 2015-01-26 NOTE — Patient Instructions (Signed)
Medication Instructions:  Your physician recommends that you continue on your current medications as directed. Please refer to the Current Medication list given to you today.  Labwork: BMET today  Testing/Procedures: Your physician has requested that you have an echocardiogram.  Echocardiography is a painless test that uses sound waves to create images of your heart. It provides your doctor with information about the size and shape of your heart and how well your heart's chambers and valves are working. This procedure takes approximately one hour. There are no restrictions for this procedure.  (on a day that Dr. [name] is the reader -- dates for her to read are:  9/1, 9/27, 10/4)  Follow-Up: Remote monitoring is used to monitor your Pacemaker of ICD from home. This monitoring reduces the number of office visits required to check your device to one time per year. It allows [place] to keep an eye on the functioning of your device to ensure it is working properly. You are scheduled for a device check from home on 04/27/15. You may send your transmission at any time that day. If you have a wireless device, the transmission will be sent automatically. After your physician reviews your transmission, you will receive a postcard with your next transmission date.  Your physician wants you to follow-up in: 1 year with Dr. Graciela Husbands.  You will receive a reminder letter in the mail two months in advance. If you don't receive a letter, please call our office to schedule the follow-up appointment.  Any Other Special Instructions Will Be Listed Below (If Applicable). Thank you for choosing [facility]!!

## 2015-01-26 NOTE — Telephone Encounter (Signed)
SPOKE TO KATY AND SHE SUGGESTED SENDING TO DR,TURNER, ROUTED AND WAITING A REPLY.

## 2015-01-26 NOTE — Telephone Encounter (Signed)
wants Dr. [name] to fill his Imdur, will speak to [facility] and see if she will take it over & call the pt back with the information, pt agreeable to plan.

## 2015-01-27 ENCOUNTER — Encounter (HOSPITAL_COMMUNITY)
Admission: RE | Admit: 2015-01-27 | Discharge: 2015-01-27 | Disposition: A | Discharge status: Home / Self Care | Payer: Self-pay | Source: Ambulatory Visit | Attending: Cardiology | Admitting: Cardiology

## 2015-01-29 ENCOUNTER — Encounter (HOSPITAL_COMMUNITY)
Admission: RE | Admit: 2015-01-29 | Discharge: 2015-01-29 | Disposition: A | Discharge status: Home / Self Care | Payer: Self-pay | Source: Ambulatory Visit | Attending: Cardiology | Admitting: Cardiology

## 2015-01-29 DIAGNOSIS — I5023 Acute on chronic systolic (congestive) heart failure: Secondary | ICD-10-CM | POA: Insufficient documentation

## 2015-02-03 ENCOUNTER — Encounter (HOSPITAL_COMMUNITY)
Admission: RE | Admit: 2015-02-03 | Discharge: 2015-02-03 | Disposition: A | Discharge status: Home / Self Care | Payer: Self-pay | Source: Ambulatory Visit | Attending: Cardiology | Admitting: Cardiology

## 2015-02-05 ENCOUNTER — Encounter (HOSPITAL_COMMUNITY)
Admission: RE | Admit: 2015-02-05 | Discharge: 2015-02-05 | Disposition: A | Discharge status: Home / Self Care | Payer: Self-pay | Source: Ambulatory Visit | Attending: Cardiology | Admitting: Cardiology

## 2015-02-08 ENCOUNTER — Encounter (HOSPITAL_COMMUNITY)
Admission: RE | Admit: 2015-02-08 | Discharge: 2015-02-08 | Disposition: A | Discharge status: Home / Self Care | Payer: Self-pay | Source: Ambulatory Visit | Attending: Cardiology | Admitting: Cardiology

## 2015-02-10 ENCOUNTER — Encounter (HOSPITAL_COMMUNITY)
Admission: RE | Admit: 2015-02-10 | Discharge: 2015-02-10 | Disposition: A | Discharge status: Home / Self Care | Payer: Self-pay | Source: Ambulatory Visit | Attending: Cardiology | Admitting: Cardiology

## 2015-02-10 ENCOUNTER — Encounter: Payer: Self-pay | Admitting: Cardiology

## 2015-02-12 ENCOUNTER — Encounter (HOSPITAL_COMMUNITY)
Admission: RE | Admit: 2015-02-12 | Discharge: 2015-02-12 | Disposition: A | Discharge status: Home / Self Care | Payer: Self-pay | Source: Ambulatory Visit | Attending: Cardiology | Admitting: Cardiology

## 2015-02-15 ENCOUNTER — Encounter (HOSPITAL_COMMUNITY)
Admission: RE | Admit: 2015-02-15 | Discharge: 2015-02-15 | Disposition: A | Discharge status: Home / Self Care | Payer: Self-pay | Source: Ambulatory Visit | Attending: Cardiology | Admitting: Cardiology

## 2015-02-17 ENCOUNTER — Encounter (HOSPITAL_COMMUNITY)
Admission: RE | Admit: 2015-02-17 | Discharge: 2015-02-17 | Disposition: A | Discharge status: Home / Self Care | Payer: Self-pay | Source: Ambulatory Visit | Attending: Cardiology | Admitting: Cardiology

## 2015-02-19 ENCOUNTER — Encounter (HOSPITAL_COMMUNITY)
Admission: RE | Admit: 2015-02-19 | Discharge: 2015-02-19 | Disposition: A | Discharge status: Home / Self Care | Payer: Self-pay | Source: Ambulatory Visit | Attending: Cardiology | Admitting: Cardiology

## 2015-02-22 ENCOUNTER — Encounter (HOSPITAL_COMMUNITY)
Admission: RE | Admit: 2015-02-22 | Discharge: 2015-02-22 | Disposition: A | Discharge status: Home / Self Care | Payer: Self-pay | Source: Ambulatory Visit | Attending: Cardiology | Admitting: Cardiology

## 2015-02-23 ENCOUNTER — Other Ambulatory Visit: Payer: Self-pay

## 2015-02-23 ENCOUNTER — Ambulatory Visit (HOSPITAL_COMMUNITY): Payer: Medicare Other | Attending: Cardiology

## 2015-02-23 ENCOUNTER — Ambulatory Visit (INDEPENDENT_AMBULATORY_CARE_PROVIDER_SITE_OTHER): Payer: Medicare Other | Admitting: *Deleted

## 2015-02-23 DIAGNOSIS — I10 Essential (primary) hypertension: Secondary | ICD-10-CM | POA: Insufficient documentation

## 2015-02-23 DIAGNOSIS — G4733 Obstructive sleep apnea (adult) (pediatric): Secondary | ICD-10-CM | POA: Diagnosis not present

## 2015-02-23 DIAGNOSIS — I340 Nonrheumatic mitral (valve) insufficiency: Secondary | ICD-10-CM | POA: Insufficient documentation

## 2015-02-23 DIAGNOSIS — Z87891 Personal history of nicotine dependence: Secondary | ICD-10-CM | POA: Diagnosis not present

## 2015-02-23 DIAGNOSIS — I350 Nonrheumatic aortic (valve) stenosis: Secondary | ICD-10-CM | POA: Insufficient documentation

## 2015-02-23 DIAGNOSIS — E785 Hyperlipidemia, unspecified: Secondary | ICD-10-CM | POA: Diagnosis not present

## 2015-02-23 DIAGNOSIS — I480 Paroxysmal atrial fibrillation: Secondary | ICD-10-CM | POA: Diagnosis not present

## 2015-02-23 DIAGNOSIS — I4891 Unspecified atrial fibrillation: Secondary | ICD-10-CM | POA: Diagnosis not present

## 2015-02-23 DIAGNOSIS — Z6837 Body mass index (BMI) 37.0-37.9, adult: Secondary | ICD-10-CM | POA: Insufficient documentation

## 2015-02-23 DIAGNOSIS — Z950 Presence of cardiac pacemaker: Secondary | ICD-10-CM | POA: Diagnosis not present

## 2015-02-23 DIAGNOSIS — Z5181 Encounter for therapeutic drug level monitoring: Secondary | ICD-10-CM

## 2015-02-23 DIAGNOSIS — I519 Heart disease, unspecified: Secondary | ICD-10-CM

## 2015-02-23 DIAGNOSIS — I517 Cardiomegaly: Secondary | ICD-10-CM | POA: Insufficient documentation

## 2015-02-23 DIAGNOSIS — I5189 Other ill-defined heart diseases: Secondary | ICD-10-CM | POA: Diagnosis present

## 2015-02-23 DIAGNOSIS — E669 Obesity, unspecified: Secondary | ICD-10-CM | POA: Diagnosis not present

## 2015-02-23 LAB — POCT INR: INR: 2.2

## 2015-02-24 ENCOUNTER — Encounter (HOSPITAL_COMMUNITY)
Admission: RE | Admit: 2015-02-24 | Discharge: 2015-02-24 | Disposition: A | Discharge status: Home / Self Care | Payer: Self-pay | Source: Ambulatory Visit | Attending: Cardiology | Admitting: Cardiology

## 2015-02-26 ENCOUNTER — Encounter (HOSPITAL_COMMUNITY)
Admission: RE | Admit: 2015-02-26 | Discharge: 2015-02-26 | Disposition: A | Discharge status: Home / Self Care | Payer: Self-pay | Source: Ambulatory Visit | Attending: Cardiology | Admitting: Cardiology

## 2015-03-01 ENCOUNTER — Encounter (HOSPITAL_COMMUNITY)
Admission: RE | Admit: 2015-03-01 | Discharge: 2015-03-01 | Disposition: A | Discharge status: Home / Self Care | Payer: Self-pay | Source: Ambulatory Visit | Attending: Cardiology | Admitting: Cardiology

## 2015-03-01 DIAGNOSIS — I5023 Acute on chronic systolic (congestive) heart failure: Secondary | ICD-10-CM | POA: Insufficient documentation

## 2015-03-03 ENCOUNTER — Encounter (HOSPITAL_COMMUNITY)
Admission: RE | Admit: 2015-03-03 | Discharge: 2015-03-03 | Disposition: A | Discharge status: Home / Self Care | Payer: Self-pay | Source: Ambulatory Visit | Attending: Cardiology | Admitting: Cardiology

## 2015-03-04 ENCOUNTER — Telehealth: Payer: Self-pay | Admitting: Internal Medicine

## 2015-03-04 NOTE — Telephone Encounter (Signed)
-----   Message from Brandon Salvia, MD sent at 02/25/2015  8:25 AM EDT ----- Please Inform Patient echo showed persistently abnormal function but somewhat improved to 35-40%   Currently no indication for ICD

## 2015-03-04 NOTE — Telephone Encounter (Signed)
Informed patient of results and verbal understanding expressed.  

## 2015-03-04 NOTE — Telephone Encounter (Signed)
Follow Up  Pt returned call results

## 2015-03-05 ENCOUNTER — Encounter (HOSPITAL_COMMUNITY)
Admission: RE | Admit: 2015-03-05 | Discharge: 2015-03-05 | Disposition: A | Discharge status: Home / Self Care | Payer: Self-pay | Source: Ambulatory Visit | Attending: Cardiology | Admitting: Cardiology

## 2015-03-08 ENCOUNTER — Encounter (HOSPITAL_COMMUNITY)
Admission: RE | Admit: 2015-03-08 | Discharge: 2015-03-08 | Disposition: A | Discharge status: Home / Self Care | Payer: Self-pay | Source: Ambulatory Visit | Attending: Cardiology | Admitting: Cardiology

## 2015-03-10 ENCOUNTER — Encounter (HOSPITAL_COMMUNITY)
Admission: RE | Admit: 2015-03-10 | Discharge: 2015-03-10 | Disposition: A | Discharge status: Home / Self Care | Payer: Self-pay | Source: Ambulatory Visit | Attending: Cardiology | Admitting: Cardiology

## 2015-03-12 ENCOUNTER — Encounter (HOSPITAL_COMMUNITY): Payer: Self-pay

## 2015-03-15 ENCOUNTER — Encounter (HOSPITAL_COMMUNITY): Payer: Self-pay

## 2015-03-17 ENCOUNTER — Encounter (HOSPITAL_COMMUNITY): Payer: Self-pay

## 2015-03-19 ENCOUNTER — Encounter (HOSPITAL_COMMUNITY): Payer: Self-pay

## 2015-03-22 ENCOUNTER — Encounter (HOSPITAL_COMMUNITY): Payer: Self-pay

## 2015-03-24 ENCOUNTER — Ambulatory Visit (INDEPENDENT_AMBULATORY_CARE_PROVIDER_SITE_OTHER): Payer: Medicare Other | Admitting: *Deleted

## 2015-03-24 ENCOUNTER — Encounter (HOSPITAL_COMMUNITY)
Admission: RE | Admit: 2015-03-24 | Discharge: 2015-03-24 | Disposition: A | Discharge status: Home / Self Care | Payer: Self-pay | Source: Ambulatory Visit | Attending: Cardiology | Admitting: Cardiology

## 2015-03-24 DIAGNOSIS — I480 Paroxysmal atrial fibrillation: Secondary | ICD-10-CM

## 2015-03-24 DIAGNOSIS — I4891 Unspecified atrial fibrillation: Secondary | ICD-10-CM | POA: Diagnosis not present

## 2015-03-24 DIAGNOSIS — Z5181 Encounter for therapeutic drug level monitoring: Secondary | ICD-10-CM

## 2015-03-24 LAB — POCT INR: INR: 1.7

## 2015-03-26 ENCOUNTER — Encounter (HOSPITAL_COMMUNITY)
Admission: RE | Admit: 2015-03-26 | Discharge: 2015-03-26 | Disposition: A | Discharge status: Home / Self Care | Payer: Self-pay | Source: Ambulatory Visit | Attending: Cardiology | Admitting: Cardiology

## 2015-03-29 ENCOUNTER — Encounter (HOSPITAL_COMMUNITY)
Admission: RE | Admit: 2015-03-29 | Discharge: 2015-03-29 | Disposition: A | Discharge status: Home / Self Care | Payer: Self-pay | Source: Ambulatory Visit | Attending: Cardiology | Admitting: Cardiology

## 2015-03-31 ENCOUNTER — Encounter (HOSPITAL_COMMUNITY)
Admission: RE | Admit: 2015-03-31 | Discharge: 2015-03-31 | Disposition: A | Discharge status: Home / Self Care | Payer: Self-pay | Source: Ambulatory Visit | Attending: Cardiology | Admitting: Cardiology

## 2015-03-31 DIAGNOSIS — I5023 Acute on chronic systolic (congestive) heart failure: Secondary | ICD-10-CM | POA: Insufficient documentation

## 2015-04-02 ENCOUNTER — Encounter (HOSPITAL_COMMUNITY)
Admission: RE | Admit: 2015-04-02 | Discharge: 2015-04-02 | Disposition: A | Discharge status: Home / Self Care | Payer: Self-pay | Source: Ambulatory Visit | Attending: Cardiology | Admitting: Cardiology

## 2015-04-05 ENCOUNTER — Encounter (HOSPITAL_COMMUNITY)
Admission: RE | Admit: 2015-04-05 | Discharge: 2015-04-05 | Disposition: A | Discharge status: Home / Self Care | Payer: Self-pay | Source: Ambulatory Visit | Attending: Cardiology | Admitting: Cardiology

## 2015-04-07 ENCOUNTER — Encounter (HOSPITAL_COMMUNITY)
Admission: RE | Admit: 2015-04-07 | Discharge: 2015-04-07 | Disposition: A | Discharge status: Home / Self Care | Payer: Self-pay | Source: Ambulatory Visit | Attending: Cardiology | Admitting: Cardiology

## 2015-04-09 ENCOUNTER — Encounter (HOSPITAL_COMMUNITY): Admission: RE | Admit: 2015-04-09 | Payer: Self-pay | Source: Ambulatory Visit

## 2015-04-09 ENCOUNTER — Telehealth (HOSPITAL_COMMUNITY): Payer: Self-pay | Admitting: *Deleted

## 2015-04-12 ENCOUNTER — Encounter (HOSPITAL_COMMUNITY)
Admission: RE | Admit: 2015-04-12 | Discharge: 2015-04-12 | Disposition: A | Discharge status: Home / Self Care | Payer: Self-pay | Source: Ambulatory Visit | Attending: Cardiology | Admitting: Cardiology

## 2015-04-14 ENCOUNTER — Ambulatory Visit (INDEPENDENT_AMBULATORY_CARE_PROVIDER_SITE_OTHER): Payer: Medicare Other | Admitting: *Deleted

## 2015-04-14 ENCOUNTER — Encounter (HOSPITAL_COMMUNITY)
Admission: RE | Admit: 2015-04-14 | Discharge: 2015-04-14 | Disposition: A | Discharge status: Home / Self Care | Payer: Self-pay | Source: Ambulatory Visit | Attending: Cardiology | Admitting: Cardiology

## 2015-04-14 DIAGNOSIS — Z5181 Encounter for therapeutic drug level monitoring: Secondary | ICD-10-CM

## 2015-04-14 DIAGNOSIS — I4891 Unspecified atrial fibrillation: Secondary | ICD-10-CM

## 2015-04-14 DIAGNOSIS — I480 Paroxysmal atrial fibrillation: Secondary | ICD-10-CM

## 2015-04-14 LAB — POCT INR: INR: 2.5

## 2015-04-14 MED ORDER — WARFARIN SODIUM 5 MG PO TABS: ORAL_TABLET | ORAL | Status: DC

## 2015-04-16 ENCOUNTER — Encounter (HOSPITAL_COMMUNITY)
Admission: RE | Admit: 2015-04-16 | Discharge: 2015-04-16 | Disposition: A | Discharge status: Home / Self Care | Payer: Self-pay | Source: Ambulatory Visit | Attending: Cardiology | Admitting: Cardiology

## 2015-04-19 ENCOUNTER — Encounter (HOSPITAL_COMMUNITY)
Admission: RE | Admit: 2015-04-19 | Discharge: 2015-04-19 | Disposition: A | Discharge status: Home / Self Care | Payer: Self-pay | Source: Ambulatory Visit | Attending: Cardiology | Admitting: Cardiology

## 2015-04-21 ENCOUNTER — Encounter (HOSPITAL_COMMUNITY)
Admission: RE | Admit: 2015-04-21 | Discharge: 2015-04-21 | Disposition: A | Discharge status: Home / Self Care | Payer: Self-pay | Source: Ambulatory Visit | Attending: Cardiology | Admitting: Cardiology

## 2015-04-26 ENCOUNTER — Encounter (HOSPITAL_COMMUNITY)
Admission: RE | Admit: 2015-04-26 | Discharge: 2015-04-26 | Disposition: A | Discharge status: Home / Self Care | Payer: Self-pay | Source: Ambulatory Visit | Attending: Cardiology | Admitting: Cardiology

## 2015-04-27 ENCOUNTER — Ambulatory Visit (INDEPENDENT_AMBULATORY_CARE_PROVIDER_SITE_OTHER): Payer: Medicare Other | Admitting: *Deleted

## 2015-04-27 DIAGNOSIS — I495 Sick sinus syndrome: Secondary | ICD-10-CM | POA: Diagnosis not present

## 2015-04-28 ENCOUNTER — Other Ambulatory Visit: Payer: Self-pay

## 2015-04-28 ENCOUNTER — Encounter (HOSPITAL_COMMUNITY): Payer: Self-pay

## 2015-04-28 DIAGNOSIS — I5022 Chronic systolic (congestive) heart failure: Secondary | ICD-10-CM

## 2015-04-28 MED ORDER — CARVEDILOL 25 MG PO TABS: 12.5000 mg | ORAL_TABLET | Freq: Two times a day (BID) | ORAL | Status: DC

## 2015-04-28 MED ORDER — POTASSIUM CHLORIDE CRYS ER 20 MEQ PO TBCR: 40.0000 meq | EXTENDED_RELEASE_TABLET | Freq: Two times a day (BID) | ORAL | Status: DC

## 2015-04-28 MED ORDER — FUROSEMIDE 80 MG PO TABS: 80.0000 mg | ORAL_TABLET | Freq: Two times a day (BID) | ORAL | Status: DC

## 2015-04-28 MED ORDER — FUROSEMIDE 20 MG PO TABS: 20.0000 mg | ORAL_TABLET | Freq: Two times a day (BID) | ORAL | Status: DC

## 2015-04-29 NOTE — Progress Notes (Signed)
Remote pacemaker transmission.   

## 2015-04-30 ENCOUNTER — Encounter (HOSPITAL_COMMUNITY)
Admission: RE | Admit: 2015-04-30 | Discharge: 2015-04-30 | Disposition: A | Discharge status: Home / Self Care | Payer: Self-pay | Source: Ambulatory Visit | Attending: Cardiology | Admitting: Cardiology

## 2015-04-30 DIAGNOSIS — I5023 Acute on chronic systolic (congestive) heart failure: Secondary | ICD-10-CM | POA: Insufficient documentation

## 2015-05-03 ENCOUNTER — Encounter (HOSPITAL_COMMUNITY)
Admission: RE | Admit: 2015-05-03 | Discharge: 2015-05-03 | Disposition: A | Discharge status: Home / Self Care | Payer: Self-pay | Source: Ambulatory Visit | Attending: Cardiology | Admitting: Cardiology

## 2015-05-05 ENCOUNTER — Encounter (HOSPITAL_COMMUNITY): Payer: Self-pay

## 2015-05-06 LAB — CUP PACEART REMOTE DEVICE CHECK
Battery Remaining Longevity: 129 mo
Battery Voltage: 2.79 V
Brady Statistic AP VP Percent: 0 %
Brady Statistic AS VP Percent: 0 %
Brady Statistic AS VS Percent: 61 %
Implantable Lead Implant Date: 20020926
Implantable Lead Model: 5092
Lead Channel Setting Pacing Amplitude: 2 V
Lead Channel Setting Sensing Sensitivity: 5.6 mV
MDC IDC LEAD IMPLANT DT: 20020926
MDC IDC LEAD LOCATION: 753859
MDC IDC LEAD LOCATION: 753860
MDC IDC MSMT BATTERY IMPEDANCE: 207 Ohm
MDC IDC MSMT LEADCHNL RA IMPEDANCE VALUE: 482 Ohm
MDC IDC MSMT LEADCHNL RV IMPEDANCE VALUE: 852 Ohm
MDC IDC SESS DTM: 20161129183238
MDC IDC SET LEADCHNL RV PACING AMPLITUDE: 2.5 V
MDC IDC SET LEADCHNL RV PACING PULSEWIDTH: 0.4 ms
MDC IDC STAT BRADY AP VS PERCENT: 39 %

## 2015-05-07 ENCOUNTER — Encounter (HOSPITAL_COMMUNITY)
Admission: RE | Admit: 2015-05-07 | Discharge: 2015-05-07 | Disposition: A | Discharge status: Home / Self Care | Payer: Self-pay | Source: Ambulatory Visit | Attending: Cardiology | Admitting: Cardiology

## 2015-05-07 ENCOUNTER — Encounter: Payer: Self-pay | Admitting: Cardiology

## 2015-05-10 ENCOUNTER — Encounter (HOSPITAL_COMMUNITY)
Admission: RE | Admit: 2015-05-10 | Discharge: 2015-05-10 | Disposition: A | Discharge status: Home / Self Care | Payer: Self-pay | Source: Ambulatory Visit | Attending: Cardiology | Admitting: Cardiology

## 2015-05-12 ENCOUNTER — Encounter (HOSPITAL_COMMUNITY)
Admission: RE | Admit: 2015-05-12 | Discharge: 2015-05-12 | Disposition: A | Discharge status: Home / Self Care | Payer: Self-pay | Source: Ambulatory Visit | Attending: Cardiology | Admitting: Cardiology

## 2015-05-12 ENCOUNTER — Ambulatory Visit (INDEPENDENT_AMBULATORY_CARE_PROVIDER_SITE_OTHER): Payer: Medicare Other | Admitting: Pharmacist

## 2015-05-12 DIAGNOSIS — I4891 Unspecified atrial fibrillation: Secondary | ICD-10-CM

## 2015-05-12 DIAGNOSIS — I480 Paroxysmal atrial fibrillation: Secondary | ICD-10-CM | POA: Diagnosis not present

## 2015-05-12 DIAGNOSIS — Z5181 Encounter for therapeutic drug level monitoring: Secondary | ICD-10-CM | POA: Diagnosis not present

## 2015-05-12 LAB — POCT INR: INR: 2

## 2015-05-14 ENCOUNTER — Encounter (HOSPITAL_COMMUNITY)
Admission: RE | Admit: 2015-05-14 | Discharge: 2015-05-14 | Disposition: A | Discharge status: Home / Self Care | Payer: Self-pay | Source: Ambulatory Visit | Attending: Cardiology | Admitting: Cardiology

## 2015-05-17 ENCOUNTER — Encounter (HOSPITAL_COMMUNITY): Payer: Self-pay

## 2015-05-19 ENCOUNTER — Encounter (HOSPITAL_COMMUNITY)
Admission: RE | Admit: 2015-05-19 | Discharge: 2015-05-19 | Disposition: A | Discharge status: Home / Self Care | Payer: Self-pay | Source: Ambulatory Visit | Attending: Cardiology | Admitting: Cardiology

## 2015-05-21 ENCOUNTER — Encounter (HOSPITAL_COMMUNITY)
Admission: RE | Admit: 2015-05-21 | Discharge: 2015-05-21 | Disposition: A | Discharge status: Home / Self Care | Payer: Self-pay | Source: Ambulatory Visit | Attending: Cardiology | Admitting: Cardiology

## 2015-05-26 ENCOUNTER — Encounter (HOSPITAL_COMMUNITY)
Admission: RE | Admit: 2015-05-26 | Discharge: 2015-05-26 | Disposition: A | Discharge status: Home / Self Care | Payer: Self-pay | Source: Ambulatory Visit | Attending: Cardiology | Admitting: Cardiology

## 2015-05-28 ENCOUNTER — Encounter (HOSPITAL_COMMUNITY)
Admission: RE | Admit: 2015-05-28 | Discharge: 2015-05-28 | Disposition: A | Discharge status: Home / Self Care | Payer: Self-pay | Source: Ambulatory Visit | Attending: Cardiology | Admitting: Cardiology

## 2015-06-02 ENCOUNTER — Encounter: Payer: Self-pay | Admitting: Cardiology

## 2015-06-02 ENCOUNTER — Encounter (HOSPITAL_COMMUNITY)
Admission: RE | Admit: 2015-06-02 | Discharge: 2015-06-02 | Disposition: A | Discharge status: Home / Self Care | Payer: Self-pay | Source: Ambulatory Visit | Attending: Cardiology | Admitting: Cardiology

## 2015-06-02 ENCOUNTER — Ambulatory Visit (INDEPENDENT_AMBULATORY_CARE_PROVIDER_SITE_OTHER): Payer: Medicare Other | Admitting: Cardiology

## 2015-06-02 VITALS — BP 110/80 | HR 76 | Ht 67.5 in | Wt 241.0 lb

## 2015-06-02 DIAGNOSIS — I10 Essential (primary) hypertension: Secondary | ICD-10-CM | POA: Diagnosis not present

## 2015-06-02 DIAGNOSIS — I5042 Chronic combined systolic (congestive) and diastolic (congestive) heart failure: Secondary | ICD-10-CM

## 2015-06-02 DIAGNOSIS — I480 Paroxysmal atrial fibrillation: Secondary | ICD-10-CM | POA: Diagnosis not present

## 2015-06-02 DIAGNOSIS — I5023 Acute on chronic systolic (congestive) heart failure: Secondary | ICD-10-CM | POA: Insufficient documentation

## 2015-06-02 DIAGNOSIS — E785 Hyperlipidemia, unspecified: Secondary | ICD-10-CM

## 2015-06-02 DIAGNOSIS — I2510 Atherosclerotic heart disease of native coronary artery without angina pectoris: Secondary | ICD-10-CM | POA: Diagnosis not present

## 2015-06-02 DIAGNOSIS — I350 Nonrheumatic aortic (valve) stenosis: Secondary | ICD-10-CM

## 2015-06-02 DIAGNOSIS — Z9861 Coronary angioplasty status: Secondary | ICD-10-CM

## 2015-06-02 NOTE — Patient Instructions (Signed)
Medication Instructions:  Your physician recommends that you continue on your current medications as directed. Please refer to the Current Medication list given to you today.   Labwork: FASTING LABS IN 1-2 WEEKS: BMET, CBC, LFTs, Lipids  Testing/Procedures: Your physician has requested that you have an echocardiogram in September, 2017. Echocardiography is a painless test that uses sound waves to create images of your heart. It provides your doctor with information about the size and shape of your heart and how well your heart's chambers and valves are working. This procedure takes approximately one hour. There are no restrictions for this procedure.  Follow-Up: Your physician wants you to follow-up in: 6 months with Dr. [name]. You will receive a reminder letter in the mail two months in advance. If you don't receive a letter, please call our office to schedule the follow-up appointment.   Any Other Special Instructions Will Be Listed Below (If Applicable).     If you need a refill on your cardiac medications before your next appointment, please call your pharmacy.

## 2015-06-02 NOTE — Progress Notes (Signed)
Cardiology Office Note   Date:  06/02/2015   ID:  TEJAN ZUHLKE, DOB May 19, 1941, MRN [id number]  PCP:  Gaye Alken, MD    Chief Complaint  Patient presents with  . Congestive Heart Failure  . Aortic Stenosis  . Hypertension      History of Present Illness: Brandon Klein is a 75 y.o. male with a hx of CAD, status post prior stenting to the LAD and balloon angioplasty to the circumflex, cath 10/2013 with severe 3 vessel ASCAD s/p CABG (LIMA-LAD, SVG to diag,SVG to circ, SVG to PDA) with normal LVF at time of cath, paroxysmal atrial fibrillation, bradycardia s/p PPM, moderate to severe aortic stenosis by recent dobutamine echo, HTN, HL. He developed acute CHF about 6 months after CABG and was found to have severe LV dysfunction. Cath 07/07/2014 showed occluded SVG to PDA, all other grafts patent. EF down felt secondary to NICM but there has been some concern regarding possible worsening AS. There was some concern that his AS was underestimated due to LV dysfunction and he underwent dobutamine echo. The aortic valve peak velocity increased from 2.31m/sec to 3.29 m/sec at peak dobutamine infusion. The mean AV gradient increased from to 37.30mmHg at peak dobutamine infusion. The AVA was calculated anywhere from 1.4cm2 at baselineto 1.15cm2 at mid dose and 1.4cm2 at high dose. All data consistent with at least moderate aortic stenosis. He underwent TEE to get a planimetered valve area which was1.14-1.4cm2 c/w moderate AS. EF at that time was 20-25%. He was referred to Dr. Johney Frame for consideration of prophylatic AICD implantation. 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 He does not meet criteria for CRT due to narrow QRS on EKG. He decided not to pursue ICD at that time and wanted to wait until his appt with Dr. Graciela Husbands in August.   He presents  back today and is doing quite well. He feels like he is back to normal. He denies any chest pain, SOB, DOE, LE edema, dizziness, palpitations or syncope. He is walking without any problems and loves exercising in cardiac rehab.     Past Medical History  Diagnosis Date  . Syncope     a. vasovagal with documented bradycardia.  . Pacemaker     a. 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 b. MDT generator replacement 2012.  . Obesity   . HTN (hypertension)   . HLD (hyperlipidemia)   . DJD (degenerative joint disease)   . GERD (gastroesophageal reflux disease)   . Impaired fasting glucose     A1C check every year  . PAF (paroxysmal atrial fibrillation) (HCC)     on chronic systemic anticoagulation  . Coronary artery disease     a. s/p PCI of LAD 1997. b. Cutting balloon angioplasty to LCx in 2002. c. s/p cath 11/2013 with severe 3 vessel ASCAD s/p CABG with LIMA to LAD, SVG to diag, SVG to left circ and SVG to PDA. d. cath 07/07/2014 occluded SVG to PDA, all other grafts patent. 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 AS (aortic stenosis)     mild by echo 10/2013  . Hypokalemia   . Hypomagnesemia   . Carotid artery plaque     a. Duplex 10/2013: mild calcific plaque origin ICA. Left: intimal wall thickening  CCA. 0-39% BICA.  Marland Kitchen PVC's (premature ventricular contractions)   . OSA (obstructive sleep apnea)   . Chronic systolic dysfunction of left ventricle   . GI bleeding     a.  with benign gastric polypectomy 05/2014.  Marland Kitchen Epistaxis   . Chronic renal insufficiency     Past Surgical History  Procedure Laterality Date  . Cholecystectomy    . Knee arthroscopy  2000    Left  . Angioplasty  1997  . Pacemaker insertion  2002    generator change MDT by Dr Graciela Husbands in 2012  . Coronary artery bypass graft N/A 12/01/2013    Procedure: CORONARY ARTERY BYPASS GRAFT TIMES FOUR USING LEFT INTERNAL MAMMARY ARTERY TO LAD, SAPHENOUS VEIN GRAFTS TO DIAGONAL, CIRCUMFELX AND POSTERIOR DESCENDING;   Surgeon: Kerin Perna, MD;  Location: [facility] OR;  Service: Open Heart Surgery;  Laterality: N/A;  . Esophagogastroduodenoscopy (egd) with propofol N/A 05/05/2014    Procedure: ESOPHAGOGASTRODUODENOSCOPY (EGD) WITH PROPOFOL;  Surgeon: Charolett Bumpers, MD;  Location: [facility] ENDOSCOPY;  Service: Endoscopy;  Laterality: N/A;  . Colonoscopy with propofol N/A 05/05/2014    Procedure: COLONOSCOPY WITH PROPOFOL;  Surgeon: Charolett Bumpers, MD;  Location: [facility] ENDOSCOPY;  Service: Endoscopy;  Laterality: N/A;  . Left heart catheterization with coronary angiogram N/A 11/25/2013    Procedure: LEFT HEART CATHETERIZATION WITH CORONARY ANGIOGRAM;  Surgeon: [name], MD;  Location: [facility] CATH LAB;  Service: Cardiovascular;  Laterality: N/A;  . Left and right heart catheterization with coronary/graft angiogram N/A 07/07/2014    Procedure: LEFT AND RIGHT HEART CATHETERIZATION WITH Isabel Caprice;  Surgeon: Kathleene Hazel, MD;  Location: [facility] CATH LAB;  Service: Cardiovascular;  Laterality: N/A;  . Tee without cardioversion N/A 08/31/2014    Procedure: TRANSESOPHAGEAL ECHOCARDIOGRAM (TEE);  Surgeon: [name], MD;  Location: [facility] ENDOSCOPY;  Service: Cardiovascular;  Laterality: N/A;     Current Outpatient Prescriptions  Medication Sig Dispense Refill  . aspirin EC 81 MG EC tablet Take 1 tablet (81 mg total) by mouth daily. (Patient taking differently: Take 81 mg by mouth every morning. )    . atorvastatin (LIPITOR) 20 MG tablet Take 20 mg by mouth daily at 6 PM.     . carvedilol (COREG) 25 MG tablet Take 0.5 tablets (12.5 mg total) by mouth 2 (two) times daily with a meal. 60 tablet 6  . Cholecalciferol (VITAMIN D) 2000 UNITS tablet Take 2,000 Units by mouth daily.    . Cyanocobalamin (VITAMIN B-12 PO) Take 1 tablet by mouth every morning.    . fexofenadine (ALLEGRA) 180 MG tablet Take 180 mg by mouth every morning.     . fluticasone (FLONASE) 50 MCG/ACT nasal spray Place 2 sprays into the nose every  morning.     . furosemide (LASIX) 20 MG tablet Take 1 tablet (20 mg total) by mouth 2 (two) times daily. Take with 80 mg to make 100mg  total 60 tablet 6  . furosemide (LASIX) 80 MG tablet Take 1 tablet (80 mg total) by mouth 2 (two) times daily. Take with 20mg  to make a total of 100mg  twice a day 60 tablet 6  . Glucosamine-Chondroit-Vit C-Mn (GLUCOSAMINE CHONDR 1500 COMPLX) CAPS Take 1 capsule by mouth every morning.     . hydrALAZINE (APRESOLINE) 25 MG tablet Take 1 tablet (25 mg total) by mouth 3 (three) times daily. 90 tablet 5  . isosorbide mononitrate (IMDUR) 30 MG 24 hr tablet Take 1 tablet (30 mg total) by mouth daily. 30  tablet 11  . L-Lysine 500 MG CAPS Take 1 capsule by mouth every morning.     . Magnesium Oxide 250 MG TABS Take 2 tablets (500 mg total) by mouth 2 (two) times daily. 120 tablet 6  . meclizine (ANTIVERT) 25 MG tablet Take 25 mg by mouth daily as needed for dizziness.     . Multiple Vitamin (MULTIVITAMIN) tablet Take 1 tablet by mouth every morning.     . nitroGLYCERIN (NITROSTAT) 0.4 MG SL tablet Place 1 tablet (0.4 mg total) under the tongue every 5 (five) minutes as needed for chest pain. (Patient taking differently: Place 0.4 mg under the tongue every 5 (five) minutes as needed for chest pain (MAX 3 TABLETS). ) 30 tablet 3  . pantoprazole (PROTONIX) 40 MG tablet Take 40 mg by mouth every morning.     . potassium chloride SA (K-DUR,KLOR-CON) 20 MEQ tablet Take 2 tablets (40 mEq total) by mouth 2 (two) times daily. 120 tablet 6  . spironolactone (ALDACTONE) 25 MG tablet Take 1 tablet (25 mg total) by mouth daily. 90 tablet 3  . warfarin (COUMADIN) 5 MG tablet Take as directed by coumadin clinic 120 tablet 1  . ONE TOUCH ULTRA TEST test strip 1 each by Other route every morning.     Letta Pate DELICA LANCETS FINE MISC 1 each by Other route every morning.      No current facility-administered medications for this visit.    Allergies:   Acetaminophen-codeine; Amoxicillin;  and Diovan    Social History:  The patient  reports that he quit smoking about 49 years ago. His smoking use included Cigarettes. He has a 10 pack-year smoking history. He has never used smokeless tobacco. He reports that he drinks alcohol. He reports that he does not use illicit drugs.   Family History:  The patient's family history includes Colon cancer in his mother; Heart attack in his brother and mother; Leukemia in his father.    ROS:  Please see the history of present illness.   Otherwise, review of systems are positive for none.   All other systems are reviewed and negative.    PHYSICAL EXAM: VS:  BP 110/80 mmHg  Pulse 76  Ht 5' 7.5" (1.715 m)  Wt 241 lb (109.317 kg)  BMI 37.17 kg/m2 , BMI Body mass index is 37.17 kg/(m^2). GEN: Well nourished, well developed, in no acute distress HEENT: normal Neck: no JVD, carotid bruits, or masses Cardiac: RRR; no murmurs, rubs, or gallops,no edema  Respiratory:  clear to auscultation bilaterally, normal work of breathing GI: soft, nontender, nondistended, + BS MS: no deformity or atrophy Skin: warm and dry, no rash Neuro:  Strength and sensation are intact Psych: euthymic mood, full affect   EKG:  EKG is not ordered today.    Recent Labs: 07/03/2014: Magnesium 2.0; TSH 0.941 07/22/2014: ALT 25 08/12/2014: Pro B Natriuretic peptide (BNP) 1197.0* 08/25/2014: Hemoglobin 12.7*; Platelets 141.0* 01/26/2015: BUN 29*; Creatinine, Ser 1.53*; Potassium 4.5; Sodium 136    Lipid Panel    Component Value Date/Time   CHOL 113 07/22/2014 1226   CHOL 116 07/14/2013 0937   TRIG 91 07/22/2014 1226   TRIG 201.0* 07/14/2013 0937   HDL 40 07/22/2014 1226   HDL 36.10* 07/14/2013 0937   CHOLHDL 3 07/14/2013 0937   VLDL 40.2* 07/14/2013 0937   LDLCALC 55 07/22/2014 1226   LDLDIRECT 47.9 07/14/2013 0937      Wt Readings from Last 3 Encounters:  06/02/15 241 lb (109.317  kg)  01/26/15 240 lb 12.8 oz (109.226 kg)  11/20/14 235 lb (106.595 kg)      ASSESSMENT AND PLAN:  1. Shortness of breath - this has resolved .  2. Chronic combined systolic/diastolic CHF class II: right and left heart cath showed severe 3 vessel ASCAD with 3/4 grafts patent (SVG to PDA ocluded) and SVG to OM with 90% stenosis prox to the graft insertion, ischemic DCM and moderate AS. PCW was 18. Continue BB/Hydralazine/nitrates. Not on ACE I due to renal insuff. Continue Lasix to  BID and aldactone  daily. Repeat echo showed improvement in EF to 35-40% on medical therapy. Weight is stable.    3. CAD s/p CABG 12/02/13 with 3/4 grafts patent by recent cath (occluded SVG to PDA and SVG to OM patent with 90% stenosis proximal to the graft insertion in the OM with patent flow distally). He has no angina. Continue ASA/statin/ beta blocker.  4. PAF (paroxysmal atrial fibrillation): Maintaining NSR. Continue Coumadin/BB.   5. Moderate aortic stenosis by recent echo - Mean gradient was and prior to CABG was . AS by recent cath was moderate with peak to peak of . Dobutamine echo showed moderate AS with aortic valve peak velocity increased from 2.77m/sec to 3.29 m/sec at peak dobutamine infusion. The mean AV gradient increased from to 37.1mmHg at peak dobutamine infusion. The AVA was calculated anywhere from 1.4cm2 at baselineto 1.15cm2 at mid dose and 1.4cm2 at high dose. All data consistent with at least moderate aortic stenosis but mean gradient at peak dobutamine nearing severe range. TEE confirmed moderate AS with AVA 1.14-1.4cm2 planimetered. He is asymptomatic.  Repeat echo 9/16 showed stable moderate AS. Will repeat echo in 1 year  6. Essential hypertension: controlled. Continue BB/Hydralazine  7. Dyslipidemia: LDL at goal at 42. Continue statin.   8. Bradycardia s/p MDT pacemaker: Follow up with the Dr. Graciela Husbands with EP  9. OSA - he refused PAP therapy    Current medicines are reviewed at length with the  patient today.  The patient does not have concerns regarding medicines.  The following changes have been made:  no change  Labs/ tests ordered today: See above Assessment and Plan No orders of the defined types were placed in this encounter.     Disposition:   FU with me in 6 months  Signed, [name], MD  06/02/2015 3:30 PM    [facility] [address] [place], [place], [place]  [address] Phone: [phone]; Fax: [phone]

## 2015-06-04 ENCOUNTER — Other Ambulatory Visit (INDEPENDENT_AMBULATORY_CARE_PROVIDER_SITE_OTHER): Payer: Medicare Other | Admitting: *Deleted

## 2015-06-04 ENCOUNTER — Encounter (HOSPITAL_COMMUNITY)
Admission: RE | Admit: 2015-06-04 | Discharge: 2015-06-04 | Disposition: A | Discharge status: Home / Self Care | Payer: Self-pay | Source: Ambulatory Visit | Attending: Cardiology | Admitting: Cardiology

## 2015-06-04 DIAGNOSIS — E785 Hyperlipidemia, unspecified: Secondary | ICD-10-CM

## 2015-06-04 DIAGNOSIS — I10 Essential (primary) hypertension: Secondary | ICD-10-CM | POA: Diagnosis not present

## 2015-06-04 DIAGNOSIS — I5042 Chronic combined systolic (congestive) and diastolic (congestive) heart failure: Secondary | ICD-10-CM

## 2015-06-04 DIAGNOSIS — I5022 Chronic systolic (congestive) heart failure: Secondary | ICD-10-CM

## 2015-06-04 DIAGNOSIS — I480 Paroxysmal atrial fibrillation: Secondary | ICD-10-CM

## 2015-06-04 LAB — LIPID PANEL
CHOLESTEROL: 154 mg/dL (ref 125–200)
HDL: 45 mg/dL (ref >=40)
LDL Cholesterol: 70 mg/dL (ref <130)
Total CHOL/HDL Ratio: 3.4 Ratio (ref <=5.0)
Triglycerides: 196 mg/dL — ABNORMAL HIGH (ref <150)
VLDL: 39 mg/dL — ABNORMAL HIGH (ref <30)

## 2015-06-04 LAB — CBC WITH DIFFERENTIAL/PLATELET
BASOS ABS: 0.1 10*3/uL (ref 0.0–0.1)
Basophils Relative: 1 % (ref 0–1)
EOS PCT: 4 % (ref 0–5)
Eosinophils Absolute: 0.2 10*3/uL (ref 0.0–0.7)
HEMATOCRIT: 47.8 % (ref 39.0–52.0)
HEMOGLOBIN: 16.2 g/dL (ref 13.0–17.0)
LYMPHS ABS: 1 10*3/uL (ref 0.7–4.0)
LYMPHS PCT: 18 % (ref 12–46)
MCH: 29 pg (ref 26.0–34.0)
MCHC: 33.9 g/dL (ref 30.0–36.0)
MCV: 85.7 fL (ref 78.0–100.0)
MPV: 10.1 fL (ref 8.6–12.4)
Monocytes Absolute: 0.7 10*3/uL (ref 0.1–1.0)
Monocytes Relative: 12 % (ref 3–12)
NEUTROS ABS: 3.6 10*3/uL (ref 1.7–7.7)
Neutrophils Relative %: 65 % (ref 43–77)
Platelets: 155 10*3/uL (ref 150–400)
RBC: 5.58 MIL/uL (ref 4.22–5.81)
RDW: 14.8 % (ref 11.5–15.5)
WBC: 5.5 10*3/uL (ref 4.0–10.5)

## 2015-06-04 LAB — HEPATIC FUNCTION PANEL
ALK PHOS: 58 U/L (ref 40–115)
ALT: 25 U/L (ref 9–46)
AST: 28 U/L (ref 10–35)
Albumin: 4.6 g/dL (ref 3.6–5.1)
BILIRUBIN DIRECT: 0.2 mg/dL (ref <=0.2)
BILIRUBIN INDIRECT: 0.7 mg/dL (ref 0.2–1.2)
TOTAL PROTEIN: 7.5 g/dL (ref 6.1–8.1)
Total Bilirubin: 0.9 mg/dL (ref 0.2–1.2)

## 2015-06-04 NOTE — Addendum Note (Signed)
Addended by: Tonita Phoenix on: 06/04/2015 11:43 AM   Modules accepted: Orders

## 2015-06-07 ENCOUNTER — Telehealth (HOSPITAL_COMMUNITY): Payer: Self-pay | Admitting: *Deleted

## 2015-06-07 ENCOUNTER — Encounter (HOSPITAL_COMMUNITY): Admission: RE | Admit: 2015-06-07 | Payer: Self-pay | Source: Ambulatory Visit

## 2015-06-09 ENCOUNTER — Encounter (HOSPITAL_COMMUNITY)
Admission: RE | Admit: 2015-06-09 | Discharge: 2015-06-09 | Disposition: A | Discharge status: Home / Self Care | Payer: Self-pay | Source: Ambulatory Visit | Attending: Cardiology | Admitting: Cardiology

## 2015-06-09 ENCOUNTER — Ambulatory Visit (INDEPENDENT_AMBULATORY_CARE_PROVIDER_SITE_OTHER): Payer: Medicare Other | Admitting: *Deleted

## 2015-06-09 DIAGNOSIS — Z5181 Encounter for therapeutic drug level monitoring: Secondary | ICD-10-CM | POA: Diagnosis not present

## 2015-06-09 DIAGNOSIS — I4891 Unspecified atrial fibrillation: Secondary | ICD-10-CM

## 2015-06-09 DIAGNOSIS — I480 Paroxysmal atrial fibrillation: Secondary | ICD-10-CM

## 2015-06-09 LAB — POCT INR: INR: 2.5

## 2015-06-11 ENCOUNTER — Encounter (HOSPITAL_COMMUNITY)
Admission: RE | Admit: 2015-06-11 | Discharge: 2015-06-11 | Disposition: A | Discharge status: Home / Self Care | Payer: Self-pay | Source: Ambulatory Visit | Attending: Cardiology | Admitting: Cardiology

## 2015-06-14 ENCOUNTER — Encounter (HOSPITAL_COMMUNITY)
Admission: RE | Admit: 2015-06-14 | Discharge: 2015-06-14 | Disposition: A | Discharge status: Home / Self Care | Payer: Self-pay | Source: Ambulatory Visit | Attending: Cardiology | Admitting: Cardiology

## 2015-06-16 ENCOUNTER — Encounter (HOSPITAL_COMMUNITY)
Admission: RE | Admit: 2015-06-16 | Discharge: 2015-06-16 | Disposition: A | Discharge status: Home / Self Care | Payer: Self-pay | Source: Ambulatory Visit | Attending: Cardiology | Admitting: Cardiology

## 2015-06-18 ENCOUNTER — Encounter (HOSPITAL_COMMUNITY)
Admission: RE | Admit: 2015-06-18 | Discharge: 2015-06-18 | Disposition: A | Discharge status: Home / Self Care | Payer: Self-pay | Source: Ambulatory Visit | Attending: Cardiology | Admitting: Cardiology

## 2015-06-21 ENCOUNTER — Encounter (HOSPITAL_COMMUNITY)
Admission: RE | Admit: 2015-06-21 | Discharge: 2015-06-21 | Disposition: A | Discharge status: Home / Self Care | Payer: Self-pay | Source: Ambulatory Visit | Attending: Cardiology | Admitting: Cardiology

## 2015-06-23 ENCOUNTER — Encounter (HOSPITAL_COMMUNITY)
Admission: RE | Admit: 2015-06-23 | Discharge: 2015-06-23 | Disposition: A | Discharge status: Home / Self Care | Payer: Self-pay | Source: Ambulatory Visit | Attending: Cardiology | Admitting: Cardiology

## 2015-06-25 ENCOUNTER — Other Ambulatory Visit: Payer: Self-pay | Admitting: Cardiology

## 2015-06-25 ENCOUNTER — Encounter (HOSPITAL_COMMUNITY)
Admission: RE | Admit: 2015-06-25 | Discharge: 2015-06-25 | Disposition: A | Discharge status: Home / Self Care | Payer: Self-pay | Source: Ambulatory Visit | Attending: Cardiology | Admitting: Cardiology

## 2015-06-28 ENCOUNTER — Encounter (HOSPITAL_COMMUNITY)
Admission: RE | Admit: 2015-06-28 | Discharge: 2015-06-28 | Disposition: A | Discharge status: Home / Self Care | Payer: Self-pay | Source: Ambulatory Visit | Attending: Cardiology | Admitting: Cardiology

## 2015-06-30 ENCOUNTER — Encounter (HOSPITAL_COMMUNITY)
Admission: RE | Admit: 2015-06-30 | Discharge: 2015-06-30 | Disposition: A | Discharge status: Home / Self Care | Payer: Self-pay | Source: Ambulatory Visit | Attending: Cardiology | Admitting: Cardiology

## 2015-06-30 DIAGNOSIS — Z951 Presence of aortocoronary bypass graft: Secondary | ICD-10-CM | POA: Insufficient documentation

## 2015-07-02 ENCOUNTER — Encounter (HOSPITAL_COMMUNITY)
Admission: RE | Admit: 2015-07-02 | Discharge: 2015-07-02 | Disposition: A | Discharge status: Home / Self Care | Payer: Self-pay | Source: Ambulatory Visit | Attending: Cardiology | Admitting: Cardiology

## 2015-07-05 ENCOUNTER — Encounter (HOSPITAL_COMMUNITY)
Admission: RE | Admit: 2015-07-05 | Discharge: 2015-07-05 | Disposition: A | Discharge status: Home / Self Care | Payer: Self-pay | Source: Ambulatory Visit | Attending: Cardiology | Admitting: Cardiology

## 2015-07-07 ENCOUNTER — Encounter (HOSPITAL_COMMUNITY)
Admission: RE | Admit: 2015-07-07 | Discharge: 2015-07-07 | Disposition: A | Discharge status: Home / Self Care | Payer: Self-pay | Source: Ambulatory Visit | Attending: Cardiology | Admitting: Cardiology

## 2015-07-09 ENCOUNTER — Encounter (HOSPITAL_COMMUNITY)
Admission: RE | Admit: 2015-07-09 | Discharge: 2015-07-09 | Disposition: A | Discharge status: Home / Self Care | Payer: Self-pay | Source: Ambulatory Visit | Attending: Cardiology | Admitting: Cardiology

## 2015-07-12 ENCOUNTER — Encounter (HOSPITAL_COMMUNITY)
Admission: RE | Admit: 2015-07-12 | Discharge: 2015-07-12 | Disposition: A | Discharge status: Home / Self Care | Payer: Self-pay | Source: Ambulatory Visit | Attending: Cardiology | Admitting: Cardiology

## 2015-07-14 ENCOUNTER — Encounter (HOSPITAL_COMMUNITY)
Admission: RE | Admit: 2015-07-14 | Discharge: 2015-07-14 | Disposition: A | Discharge status: Home / Self Care | Payer: Self-pay | Source: Ambulatory Visit | Attending: Cardiology | Admitting: Cardiology

## 2015-07-16 ENCOUNTER — Encounter (HOSPITAL_COMMUNITY)
Admission: RE | Admit: 2015-07-16 | Discharge: 2015-07-16 | Disposition: A | Discharge status: Home / Self Care | Payer: Self-pay | Source: Ambulatory Visit | Attending: Cardiology | Admitting: Cardiology

## 2015-07-19 ENCOUNTER — Encounter (HOSPITAL_COMMUNITY)
Admission: RE | Admit: 2015-07-19 | Discharge: 2015-07-19 | Disposition: A | Discharge status: Home / Self Care | Payer: Self-pay | Source: Ambulatory Visit | Attending: Cardiology | Admitting: Cardiology

## 2015-07-21 ENCOUNTER — Ambulatory Visit (INDEPENDENT_AMBULATORY_CARE_PROVIDER_SITE_OTHER): Payer: Medicare Other | Admitting: *Deleted

## 2015-07-21 ENCOUNTER — Encounter (HOSPITAL_COMMUNITY)
Admission: RE | Admit: 2015-07-21 | Discharge: 2015-07-21 | Disposition: A | Discharge status: Home / Self Care | Payer: Self-pay | Source: Ambulatory Visit | Attending: Cardiology | Admitting: Cardiology

## 2015-07-21 DIAGNOSIS — I480 Paroxysmal atrial fibrillation: Secondary | ICD-10-CM | POA: Diagnosis not present

## 2015-07-21 DIAGNOSIS — Z5181 Encounter for therapeutic drug level monitoring: Secondary | ICD-10-CM | POA: Diagnosis not present

## 2015-07-21 DIAGNOSIS — I4891 Unspecified atrial fibrillation: Secondary | ICD-10-CM

## 2015-07-21 LAB — POCT INR: INR: 2.1

## 2015-07-23 ENCOUNTER — Encounter (HOSPITAL_COMMUNITY)
Admission: RE | Admit: 2015-07-23 | Discharge: 2015-07-23 | Disposition: A | Discharge status: Home / Self Care | Payer: Self-pay | Source: Ambulatory Visit | Attending: Cardiology | Admitting: Cardiology

## 2015-07-26 ENCOUNTER — Encounter (HOSPITAL_COMMUNITY)
Admission: RE | Admit: 2015-07-26 | Discharge: 2015-07-26 | Disposition: A | Discharge status: Home / Self Care | Payer: Self-pay | Source: Ambulatory Visit | Attending: Cardiology | Admitting: Cardiology

## 2015-07-27 ENCOUNTER — Telehealth: Payer: Self-pay | Admitting: Cardiology

## 2015-07-27 ENCOUNTER — Ambulatory Visit (INDEPENDENT_AMBULATORY_CARE_PROVIDER_SITE_OTHER): Payer: Medicare Other | Admitting: *Deleted

## 2015-07-27 DIAGNOSIS — I495 Sick sinus syndrome: Secondary | ICD-10-CM

## 2015-07-27 NOTE — Telephone Encounter (Signed)
LMOVM reminding pt to send remote transmission.   

## 2015-07-27 NOTE — Progress Notes (Signed)
Remote pacemaker transmission.   

## 2015-07-28 ENCOUNTER — Encounter (HOSPITAL_COMMUNITY): Payer: Self-pay

## 2015-07-28 DIAGNOSIS — Z951 Presence of aortocoronary bypass graft: Secondary | ICD-10-CM | POA: Insufficient documentation

## 2015-07-30 ENCOUNTER — Encounter (HOSPITAL_COMMUNITY)
Admission: RE | Admit: 2015-07-30 | Discharge: 2015-07-30 | Disposition: A | Discharge status: Home / Self Care | Payer: Self-pay | Source: Ambulatory Visit | Attending: Cardiology | Admitting: Cardiology

## 2015-08-02 ENCOUNTER — Encounter (HOSPITAL_COMMUNITY)
Admission: RE | Admit: 2015-08-02 | Discharge: 2015-08-02 | Disposition: A | Discharge status: Home / Self Care | Payer: Self-pay | Source: Ambulatory Visit | Attending: Cardiology | Admitting: Cardiology

## 2015-08-04 ENCOUNTER — Encounter (HOSPITAL_COMMUNITY)
Admission: RE | Admit: 2015-08-04 | Discharge: 2015-08-04 | Disposition: A | Discharge status: Home / Self Care | Payer: Self-pay | Source: Ambulatory Visit | Attending: Cardiology | Admitting: Cardiology

## 2015-08-06 ENCOUNTER — Encounter (HOSPITAL_COMMUNITY)
Admission: RE | Admit: 2015-08-06 | Discharge: 2015-08-06 | Disposition: A | Discharge status: Home / Self Care | Payer: Self-pay | Source: Ambulatory Visit | Attending: Cardiology | Admitting: Cardiology

## 2015-08-09 ENCOUNTER — Encounter (HOSPITAL_COMMUNITY)
Admission: RE | Admit: 2015-08-09 | Discharge: 2015-08-09 | Disposition: A | Discharge status: Home / Self Care | Payer: Self-pay | Source: Ambulatory Visit | Attending: Cardiology | Admitting: Cardiology

## 2015-08-11 ENCOUNTER — Encounter (HOSPITAL_COMMUNITY)
Admission: RE | Admit: 2015-08-11 | Discharge: 2015-08-11 | Disposition: A | Discharge status: Home / Self Care | Payer: Self-pay | Source: Ambulatory Visit | Attending: Cardiology | Admitting: Cardiology

## 2015-08-13 ENCOUNTER — Encounter (HOSPITAL_COMMUNITY)
Admission: RE | Admit: 2015-08-13 | Discharge: 2015-08-13 | Disposition: A | Discharge status: Home / Self Care | Payer: Self-pay | Source: Ambulatory Visit | Attending: Cardiology | Admitting: Cardiology

## 2015-08-16 ENCOUNTER — Encounter (HOSPITAL_COMMUNITY)
Admission: RE | Admit: 2015-08-16 | Discharge: 2015-08-16 | Disposition: A | Discharge status: Home / Self Care | Payer: Self-pay | Source: Ambulatory Visit | Attending: Cardiology | Admitting: Cardiology

## 2015-08-18 ENCOUNTER — Encounter (HOSPITAL_COMMUNITY)
Admission: RE | Admit: 2015-08-18 | Discharge: 2015-08-18 | Disposition: A | Discharge status: Home / Self Care | Payer: Self-pay | Source: Ambulatory Visit | Attending: Cardiology | Admitting: Cardiology

## 2015-08-20 ENCOUNTER — Encounter (HOSPITAL_COMMUNITY)
Admission: RE | Admit: 2015-08-20 | Discharge: 2015-08-20 | Disposition: A | Discharge status: Home / Self Care | Payer: Self-pay | Source: Ambulatory Visit | Attending: Cardiology | Admitting: Cardiology

## 2015-08-23 ENCOUNTER — Encounter (HOSPITAL_COMMUNITY)
Admission: RE | Admit: 2015-08-23 | Discharge: 2015-08-23 | Disposition: A | Discharge status: Home / Self Care | Payer: Self-pay | Source: Ambulatory Visit | Attending: Cardiology | Admitting: Cardiology

## 2015-08-25 ENCOUNTER — Encounter (HOSPITAL_COMMUNITY)
Admission: RE | Admit: 2015-08-25 | Discharge: 2015-08-25 | Disposition: A | Discharge status: Home / Self Care | Payer: Self-pay | Source: Ambulatory Visit | Attending: Cardiology | Admitting: Cardiology

## 2015-08-27 ENCOUNTER — Encounter (HOSPITAL_COMMUNITY)
Admission: RE | Admit: 2015-08-27 | Discharge: 2015-08-27 | Disposition: A | Discharge status: Home / Self Care | Payer: Self-pay | Source: Ambulatory Visit | Attending: Cardiology | Admitting: Cardiology

## 2015-08-27 ENCOUNTER — Encounter: Payer: Self-pay | Admitting: Cardiology

## 2015-08-27 LAB — CUP PACEART REMOTE DEVICE CHECK
Battery Remaining Longevity: 124 mo
Date Time Interrogation Session: 20170228162331
Implantable Lead Implant Date: 20020926
Implantable Lead Location: 753859
Lead Channel Pacing Threshold Amplitude: 1 V
Lead Channel Pacing Threshold Pulse Width: 0.4 ms
Lead Channel Setting Pacing Amplitude: 2 V
Lead Channel Setting Pacing Amplitude: 2.5 V
Lead Channel Setting Sensing Sensitivity: 5.6 mV
MDC IDC LEAD IMPLANT DT: 20020926
MDC IDC LEAD LOCATION: 753860
MDC IDC MSMT BATTERY IMPEDANCE: 232 Ohm
MDC IDC MSMT BATTERY VOLTAGE: 2.79 V
MDC IDC MSMT LEADCHNL RA IMPEDANCE VALUE: 510 Ohm
MDC IDC MSMT LEADCHNL RA PACING THRESHOLD AMPLITUDE: 0.5 V
MDC IDC MSMT LEADCHNL RA PACING THRESHOLD PULSEWIDTH: 0.4 ms
MDC IDC MSMT LEADCHNL RA SENSING INTR AMPL: 1.4 mV
MDC IDC MSMT LEADCHNL RV IMPEDANCE VALUE: 903 Ohm
MDC IDC MSMT LEADCHNL RV SENSING INTR AMPL: 11.2 mV
MDC IDC SET LEADCHNL RV PACING PULSEWIDTH: 0.4 ms
MDC IDC STAT BRADY AP VP PERCENT: 0 %
MDC IDC STAT BRADY AP VS PERCENT: 41 %
MDC IDC STAT BRADY AS VP PERCENT: 0 %
MDC IDC STAT BRADY AS VS PERCENT: 59 %

## 2015-08-30 ENCOUNTER — Encounter (HOSPITAL_COMMUNITY)
Admission: RE | Admit: 2015-08-30 | Discharge: 2015-08-30 | Disposition: A | Discharge status: Home / Self Care | Payer: Self-pay | Source: Ambulatory Visit | Attending: Cardiology | Admitting: Cardiology

## 2015-08-30 DIAGNOSIS — Z951 Presence of aortocoronary bypass graft: Secondary | ICD-10-CM | POA: Insufficient documentation

## 2015-09-01 ENCOUNTER — Encounter (HOSPITAL_COMMUNITY)
Admission: RE | Admit: 2015-09-01 | Discharge: 2015-09-01 | Disposition: A | Discharge status: Home / Self Care | Payer: Self-pay | Source: Ambulatory Visit | Attending: Cardiology | Admitting: Cardiology

## 2015-09-03 ENCOUNTER — Encounter (HOSPITAL_COMMUNITY)
Admission: RE | Admit: 2015-09-03 | Discharge: 2015-09-03 | Disposition: A | Discharge status: Home / Self Care | Payer: Self-pay | Source: Ambulatory Visit | Attending: Cardiology | Admitting: Cardiology

## 2015-09-03 ENCOUNTER — Ambulatory Visit (INDEPENDENT_AMBULATORY_CARE_PROVIDER_SITE_OTHER): Payer: Medicare Other | Admitting: *Deleted

## 2015-09-03 DIAGNOSIS — Z5181 Encounter for therapeutic drug level monitoring: Secondary | ICD-10-CM | POA: Diagnosis not present

## 2015-09-03 DIAGNOSIS — I4891 Unspecified atrial fibrillation: Secondary | ICD-10-CM

## 2015-09-03 DIAGNOSIS — I480 Paroxysmal atrial fibrillation: Secondary | ICD-10-CM | POA: Diagnosis not present

## 2015-09-03 LAB — POCT INR: INR: 2.1

## 2015-09-06 ENCOUNTER — Encounter (HOSPITAL_COMMUNITY): Payer: Self-pay

## 2015-09-08 ENCOUNTER — Encounter (HOSPITAL_COMMUNITY): Payer: Self-pay

## 2015-09-10 ENCOUNTER — Encounter (HOSPITAL_COMMUNITY): Payer: Self-pay

## 2015-09-13 ENCOUNTER — Encounter (HOSPITAL_COMMUNITY): Payer: Self-pay

## 2015-09-15 ENCOUNTER — Encounter (HOSPITAL_COMMUNITY)
Admission: RE | Admit: 2015-09-15 | Discharge: 2015-09-15 | Disposition: A | Discharge status: Home / Self Care | Payer: Self-pay | Source: Ambulatory Visit | Attending: Cardiology | Admitting: Cardiology

## 2015-09-17 ENCOUNTER — Encounter (HOSPITAL_COMMUNITY)
Admission: RE | Admit: 2015-09-17 | Discharge: 2015-09-17 | Disposition: A | Discharge status: Home / Self Care | Payer: Self-pay | Source: Ambulatory Visit | Attending: Cardiology | Admitting: Cardiology

## 2015-09-20 ENCOUNTER — Encounter (HOSPITAL_COMMUNITY)
Admission: RE | Admit: 2015-09-20 | Discharge: 2015-09-20 | Disposition: A | Discharge status: Home / Self Care | Payer: Self-pay | Source: Ambulatory Visit | Attending: Cardiology | Admitting: Cardiology

## 2015-09-22 ENCOUNTER — Encounter (HOSPITAL_COMMUNITY)
Admission: RE | Admit: 2015-09-22 | Discharge: 2015-09-22 | Disposition: A | Discharge status: Home / Self Care | Payer: Self-pay | Source: Ambulatory Visit | Attending: Cardiology | Admitting: Cardiology

## 2015-09-24 ENCOUNTER — Encounter (HOSPITAL_COMMUNITY)
Admission: RE | Admit: 2015-09-24 | Discharge: 2015-09-24 | Disposition: A | Discharge status: Home / Self Care | Payer: Self-pay | Source: Ambulatory Visit | Attending: Cardiology | Admitting: Cardiology

## 2015-09-27 ENCOUNTER — Encounter (HOSPITAL_COMMUNITY)
Admission: RE | Admit: 2015-09-27 | Discharge: 2015-09-27 | Disposition: A | Discharge status: Home / Self Care | Payer: Self-pay | Source: Ambulatory Visit | Attending: Cardiology | Admitting: Cardiology

## 2015-09-27 DIAGNOSIS — Z951 Presence of aortocoronary bypass graft: Secondary | ICD-10-CM | POA: Insufficient documentation

## 2015-09-29 ENCOUNTER — Encounter (HOSPITAL_COMMUNITY)
Admission: RE | Admit: 2015-09-29 | Discharge: 2015-09-29 | Disposition: A | Discharge status: Home / Self Care | Payer: Self-pay | Source: Ambulatory Visit | Attending: Cardiology | Admitting: Cardiology

## 2015-10-01 ENCOUNTER — Encounter (HOSPITAL_COMMUNITY)
Admission: RE | Admit: 2015-10-01 | Discharge: 2015-10-01 | Disposition: A | Discharge status: Home / Self Care | Payer: Self-pay | Source: Ambulatory Visit | Attending: Cardiology | Admitting: Cardiology

## 2015-10-04 ENCOUNTER — Encounter (HOSPITAL_COMMUNITY)
Admission: RE | Admit: 2015-10-04 | Discharge: 2015-10-04 | Disposition: A | Discharge status: Home / Self Care | Payer: Self-pay | Source: Ambulatory Visit | Attending: Cardiology | Admitting: Cardiology

## 2015-10-06 ENCOUNTER — Encounter (HOSPITAL_COMMUNITY)
Admission: RE | Admit: 2015-10-06 | Discharge: 2015-10-06 | Disposition: A | Discharge status: Home / Self Care | Payer: Self-pay | Source: Ambulatory Visit | Attending: Cardiology | Admitting: Cardiology

## 2015-10-08 ENCOUNTER — Encounter (HOSPITAL_COMMUNITY)
Admission: RE | Admit: 2015-10-08 | Discharge: 2015-10-08 | Disposition: A | Discharge status: Home / Self Care | Payer: Self-pay | Source: Ambulatory Visit | Attending: Cardiology | Admitting: Cardiology

## 2015-10-11 ENCOUNTER — Encounter (HOSPITAL_COMMUNITY)
Admission: RE | Admit: 2015-10-11 | Discharge: 2015-10-11 | Disposition: A | Discharge status: Home / Self Care | Payer: Self-pay | Source: Ambulatory Visit | Attending: Cardiology | Admitting: Cardiology

## 2015-10-13 ENCOUNTER — Encounter (HOSPITAL_COMMUNITY)
Admission: RE | Admit: 2015-10-13 | Discharge: 2015-10-13 | Disposition: A | Discharge status: Home / Self Care | Payer: Self-pay | Source: Ambulatory Visit | Attending: Cardiology | Admitting: Cardiology

## 2015-10-15 ENCOUNTER — Ambulatory Visit (INDEPENDENT_AMBULATORY_CARE_PROVIDER_SITE_OTHER): Payer: Medicare Other | Admitting: *Deleted

## 2015-10-15 ENCOUNTER — Encounter (HOSPITAL_COMMUNITY)
Admission: RE | Admit: 2015-10-15 | Discharge: 2015-10-15 | Disposition: A | Discharge status: Home / Self Care | Payer: Self-pay | Source: Ambulatory Visit | Attending: Cardiology | Admitting: Cardiology

## 2015-10-15 DIAGNOSIS — I480 Paroxysmal atrial fibrillation: Secondary | ICD-10-CM

## 2015-10-15 DIAGNOSIS — I4891 Unspecified atrial fibrillation: Secondary | ICD-10-CM

## 2015-10-15 DIAGNOSIS — Z5181 Encounter for therapeutic drug level monitoring: Secondary | ICD-10-CM | POA: Diagnosis not present

## 2015-10-15 LAB — POCT INR: INR: 2.1

## 2015-10-15 MED ORDER — WARFARIN SODIUM 5 MG PO TABS: ORAL_TABLET | ORAL | Status: DC

## 2015-10-18 ENCOUNTER — Encounter (HOSPITAL_COMMUNITY)
Admission: RE | Admit: 2015-10-18 | Discharge: 2015-10-18 | Disposition: A | Discharge status: Home / Self Care | Payer: Self-pay | Source: Ambulatory Visit | Attending: Cardiology | Admitting: Cardiology

## 2015-10-20 ENCOUNTER — Encounter (HOSPITAL_COMMUNITY)
Admission: RE | Admit: 2015-10-20 | Discharge: 2015-10-20 | Disposition: A | Discharge status: Home / Self Care | Payer: Self-pay | Source: Ambulatory Visit | Attending: Cardiology | Admitting: Cardiology

## 2015-10-22 ENCOUNTER — Encounter (HOSPITAL_COMMUNITY)
Admission: RE | Admit: 2015-10-22 | Discharge: 2015-10-22 | Disposition: A | Discharge status: Home / Self Care | Payer: Self-pay | Source: Ambulatory Visit | Attending: Cardiology | Admitting: Cardiology

## 2015-10-26 ENCOUNTER — Ambulatory Visit (INDEPENDENT_AMBULATORY_CARE_PROVIDER_SITE_OTHER): Payer: Medicare Other | Admitting: *Deleted

## 2015-10-26 ENCOUNTER — Telehealth: Payer: Self-pay | Admitting: Cardiology

## 2015-10-26 DIAGNOSIS — I495 Sick sinus syndrome: Secondary | ICD-10-CM | POA: Diagnosis not present

## 2015-10-26 NOTE — Telephone Encounter (Signed)
Spoke with pt and reminded pt of remote transmission that is due today. Pt verbalized understanding.   

## 2015-10-27 ENCOUNTER — Encounter (HOSPITAL_COMMUNITY)
Admission: RE | Admit: 2015-10-27 | Discharge: 2015-10-27 | Disposition: A | Discharge status: Home / Self Care | Payer: Self-pay | Source: Ambulatory Visit | Attending: Cardiology | Admitting: Cardiology

## 2015-10-27 NOTE — Progress Notes (Signed)
Remote pacemaker transmission.   

## 2015-10-29 ENCOUNTER — Encounter (HOSPITAL_COMMUNITY)
Admission: RE | Admit: 2015-10-29 | Discharge: 2015-10-29 | Disposition: A | Discharge status: Home / Self Care | Payer: Self-pay | Source: Ambulatory Visit | Attending: Cardiology | Admitting: Cardiology

## 2015-10-29 DIAGNOSIS — Z951 Presence of aortocoronary bypass graft: Secondary | ICD-10-CM | POA: Insufficient documentation

## 2015-11-01 ENCOUNTER — Encounter (HOSPITAL_COMMUNITY)
Admission: RE | Admit: 2015-11-01 | Discharge: 2015-11-01 | Disposition: A | Discharge status: Home / Self Care | Payer: Self-pay | Source: Ambulatory Visit | Attending: Cardiology | Admitting: Cardiology

## 2015-11-03 ENCOUNTER — Encounter (HOSPITAL_COMMUNITY)
Admission: RE | Admit: 2015-11-03 | Discharge: 2015-11-03 | Disposition: A | Discharge status: Home / Self Care | Payer: Self-pay | Source: Ambulatory Visit | Attending: Cardiology | Admitting: Cardiology

## 2015-11-05 ENCOUNTER — Encounter (HOSPITAL_COMMUNITY)
Admission: RE | Admit: 2015-11-05 | Discharge: 2015-11-05 | Disposition: A | Discharge status: Home / Self Care | Payer: Self-pay | Source: Ambulatory Visit | Attending: Cardiology | Admitting: Cardiology

## 2015-11-08 ENCOUNTER — Encounter (HOSPITAL_COMMUNITY)
Admission: RE | Admit: 2015-11-08 | Discharge: 2015-11-08 | Disposition: A | Discharge status: Home / Self Care | Payer: Self-pay | Source: Ambulatory Visit | Attending: Cardiology | Admitting: Cardiology

## 2015-11-09 LAB — CUP PACEART REMOTE DEVICE CHECK
Battery Impedance: 232 Ohm
Brady Statistic AP VP Percent: 0 %
Brady Statistic AS VP Percent: 0 %
Brady Statistic AS VS Percent: 59 %
Date Time Interrogation Session: 20170530165726
Implantable Lead Implant Date: 20020926
Implantable Lead Location: 753860
Implantable Lead Model: 5076
Lead Channel Setting Pacing Amplitude: 2 V
Lead Channel Setting Pacing Amplitude: 2.5 V
Lead Channel Setting Pacing Pulse Width: 0.4 ms
Lead Channel Setting Sensing Sensitivity: 5.6 mV
MDC IDC LEAD IMPLANT DT: 20020926
MDC IDC LEAD LOCATION: 753859
MDC IDC MSMT BATTERY REMAINING LONGEVITY: 125 mo
MDC IDC MSMT BATTERY VOLTAGE: 2.79 V
MDC IDC MSMT LEADCHNL RA IMPEDANCE VALUE: 503 Ohm
MDC IDC MSMT LEADCHNL RV IMPEDANCE VALUE: 903 Ohm
MDC IDC STAT BRADY AP VS PERCENT: 40 %

## 2015-11-10 ENCOUNTER — Encounter (HOSPITAL_COMMUNITY)
Admission: RE | Admit: 2015-11-10 | Discharge: 2015-11-10 | Disposition: A | Discharge status: Home / Self Care | Payer: Self-pay | Source: Ambulatory Visit | Attending: Cardiology | Admitting: Cardiology

## 2015-11-12 ENCOUNTER — Encounter (HOSPITAL_COMMUNITY): Payer: Self-pay

## 2015-11-15 ENCOUNTER — Encounter (HOSPITAL_COMMUNITY): Payer: Self-pay

## 2015-11-16 ENCOUNTER — Encounter: Payer: Self-pay | Admitting: Cardiology

## 2015-11-17 ENCOUNTER — Encounter (HOSPITAL_COMMUNITY): Payer: Self-pay

## 2015-11-19 ENCOUNTER — Encounter (HOSPITAL_COMMUNITY)
Admission: RE | Admit: 2015-11-19 | Discharge: 2015-11-19 | Disposition: A | Discharge status: Home / Self Care | Payer: Self-pay | Source: Ambulatory Visit | Attending: Cardiology | Admitting: Cardiology

## 2015-11-22 ENCOUNTER — Encounter (HOSPITAL_COMMUNITY)
Admission: RE | Admit: 2015-11-22 | Discharge: 2015-11-22 | Disposition: A | Discharge status: Home / Self Care | Payer: Self-pay | Source: Ambulatory Visit | Attending: Cardiology | Admitting: Cardiology

## 2015-11-23 ENCOUNTER — Other Ambulatory Visit: Payer: Self-pay | Admitting: Cardiology

## 2015-11-24 ENCOUNTER — Encounter (HOSPITAL_COMMUNITY)
Admission: RE | Admit: 2015-11-24 | Discharge: 2015-11-24 | Disposition: A | Discharge status: Home / Self Care | Payer: Self-pay | Source: Ambulatory Visit | Attending: Cardiology | Admitting: Cardiology

## 2015-11-26 ENCOUNTER — Ambulatory Visit (INDEPENDENT_AMBULATORY_CARE_PROVIDER_SITE_OTHER): Payer: Medicare Other | Admitting: *Deleted

## 2015-11-26 ENCOUNTER — Encounter (HOSPITAL_COMMUNITY)
Admission: RE | Admit: 2015-11-26 | Discharge: 2015-11-26 | Disposition: A | Discharge status: Home / Self Care | Payer: Self-pay | Source: Ambulatory Visit | Attending: Cardiology | Admitting: Cardiology

## 2015-11-26 DIAGNOSIS — I480 Paroxysmal atrial fibrillation: Secondary | ICD-10-CM

## 2015-11-26 DIAGNOSIS — Z5181 Encounter for therapeutic drug level monitoring: Secondary | ICD-10-CM | POA: Diagnosis not present

## 2015-11-26 DIAGNOSIS — I4891 Unspecified atrial fibrillation: Secondary | ICD-10-CM

## 2015-11-26 LAB — POCT INR: INR: 2.4

## 2015-11-29 ENCOUNTER — Encounter (HOSPITAL_COMMUNITY)
Admission: RE | Admit: 2015-11-29 | Discharge: 2015-11-29 | Disposition: A | Discharge status: Home / Self Care | Payer: Self-pay | Source: Ambulatory Visit | Attending: Cardiology | Admitting: Cardiology

## 2015-11-29 DIAGNOSIS — Z951 Presence of aortocoronary bypass graft: Secondary | ICD-10-CM | POA: Insufficient documentation

## 2015-12-01 ENCOUNTER — Encounter (HOSPITAL_COMMUNITY)
Admission: RE | Admit: 2015-12-01 | Discharge: 2015-12-01 | Disposition: A | Discharge status: Home / Self Care | Payer: Self-pay | Source: Ambulatory Visit | Attending: Cardiology | Admitting: Cardiology

## 2015-12-02 ENCOUNTER — Encounter (INDEPENDENT_AMBULATORY_CARE_PROVIDER_SITE_OTHER): Payer: Self-pay

## 2015-12-02 ENCOUNTER — Encounter: Payer: Self-pay | Admitting: Cardiology

## 2015-12-02 ENCOUNTER — Ambulatory Visit (INDEPENDENT_AMBULATORY_CARE_PROVIDER_SITE_OTHER): Payer: Medicare Other | Admitting: Cardiology

## 2015-12-02 VITALS — BP 126/64 | HR 84 | Ht 67.5 in | Wt 239.0 lb

## 2015-12-02 DIAGNOSIS — I350 Nonrheumatic aortic (valve) stenosis: Secondary | ICD-10-CM | POA: Diagnosis not present

## 2015-12-02 DIAGNOSIS — R195 Other fecal abnormalities: Secondary | ICD-10-CM | POA: Insufficient documentation

## 2015-12-02 DIAGNOSIS — K219 Gastro-esophageal reflux disease without esophagitis: Secondary | ICD-10-CM | POA: Insufficient documentation

## 2015-12-02 DIAGNOSIS — D649 Anemia, unspecified: Secondary | ICD-10-CM | POA: Insufficient documentation

## 2015-12-02 DIAGNOSIS — I480 Paroxysmal atrial fibrillation: Secondary | ICD-10-CM

## 2015-12-02 DIAGNOSIS — I5042 Chronic combined systolic (congestive) and diastolic (congestive) heart failure: Secondary | ICD-10-CM | POA: Diagnosis not present

## 2015-12-02 DIAGNOSIS — E119 Type 2 diabetes mellitus without complications: Secondary | ICD-10-CM

## 2015-12-02 DIAGNOSIS — Z9861 Coronary angioplasty status: Secondary | ICD-10-CM

## 2015-12-02 DIAGNOSIS — I359 Nonrheumatic aortic valve disorder, unspecified: Secondary | ICD-10-CM | POA: Insufficient documentation

## 2015-12-02 DIAGNOSIS — I10 Essential (primary) hypertension: Secondary | ICD-10-CM

## 2015-12-02 DIAGNOSIS — I2510 Atherosclerotic heart disease of native coronary artery without angina pectoris: Secondary | ICD-10-CM

## 2015-12-02 DIAGNOSIS — M1990 Unspecified osteoarthritis, unspecified site: Secondary | ICD-10-CM | POA: Insufficient documentation

## 2015-12-02 DIAGNOSIS — R42 Dizziness and giddiness: Secondary | ICD-10-CM | POA: Insufficient documentation

## 2015-12-02 DIAGNOSIS — E785 Hyperlipidemia, unspecified: Secondary | ICD-10-CM

## 2015-12-02 HISTORY — DX: Type 2 diabetes mellitus without complications: E11.9

## 2015-12-02 NOTE — Progress Notes (Signed)
Cardiology Office Note    Date:  12/02/2015   ID:  Brandon Klein, DOB June 11, 1940, MRN [id number]  PCP:  Gaye Alken, MD  Cardiologist:  Armanda Magic, MD   Chief Complaint  Patient presents with  . Coronary Artery Disease  . Hypertension  . Atrial Fibrillation  . Hyperlipidemia  . Aortic Stenosis    History of Present Illness:  Brandon Klein is a 75 y.o. male with a hx of CAD, status post prior stenting to the LAD and balloon angioplasty to the circumflex, cath 10/2013 with severe 3 vessel ASCAD s/p CABG (LIMA-LAD, SVG to diag,SVG to circ, SVG to PDA) with normal LVF at time of cath, paroxysmal atrial fibrillation, bradycardia s/p PPM, moderate to severe aortic stenosis by recent dobutamine echo, HTN, HL. He developed acute CHF about 6 months after CABG and was found to have severe LV dysfunction. Cath 07/07/2014 showed occluded SVG to PDA, all other grafts patent. EF down felt secondary to NICM but there has been some concern regarding possible worsening AS. There was some concern that his AS was underestimated due to LV dysfunction and he underwent dobutamine echo. The aortic valve peak velocity increased from 2.8m/sec to 3.29 m/sec at peak dobutamine infusion. The mean AV gradient increased from to 37.45mmHg at peak dobutamine infusion. The AVA was calculated anywhere from 1.4cm2 at baselineto 1.15cm2 at mid dose and 1.4cm2 at high dose. All data consistent with at least moderate aortic stenosis. He underwent TEE to get a planimetered valve area which was1.14-1.4cm2 c/w moderate AS. EF at that time was 20-25%. He was referred to Dr. Johney Frame for consideration of prophylatic AICD implantation. 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 He does not meet criteria for CRT due to narrow QRS on EKG. He decided not to pursue ICD at that time and wanted to wait  until his appt with Dr. Graciela Husbands in August. Repeat echo at that time showed EF 35-40% and ICD was not indicated.   He presents back today and is doing quite well. He feels like he is back to normal. He denies any chest pain, SOB, Klein, LE edema, dizzines or syncope. He is walking without any problems and loves exercising in cardiac rehab. he occasionally has some palptiations but it is rare.     Past Medical History  Diagnosis Date  . Syncope     a. vasovagal with documented bradycardia.  . Pacemaker     a. 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 b. MDT generator replacement 2012.  . Obesity   . HTN (hypertension)   . HLD (hyperlipidemia)   . DJD (degenerative joint disease)   . GERD (gastroesophageal reflux disease)   . Impaired fasting glucose     A1C check every year  . PAF (paroxysmal atrial fibrillation) (HCC)     on chronic systemic anticoagulation  . Coronary artery disease     a. s/p PCI of LAD 1997. b. Cutting balloon angioplasty to LCx in 2002. c. s/p cath 11/2013 with severe 3 vessel ASCAD s/p CABG with LIMA to LAD, SVG to diag, SVG to left circ and SVG to PDA. d. cath 07/07/2014 occluded SVG to PDA, all other grafts patent. 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 AS (aortic stenosis)     mild by echo 10/2013  . Hypokalemia   . Hypomagnesemia   . Carotid  artery plaque     a. Duplex 10/2013: mild calcific plaque origin ICA. Left: intimal wall thickening CCA. 0-39% BICA.  Marland Kitchen PVC's (premature ventricular contractions)   . OSA (obstructive sleep apnea)   . Chronic systolic dysfunction of left ventricle   . GI bleeding     a.  with benign gastric polypectomy 05/2014.  Marland Kitchen Epistaxis   . Chronic renal insufficiency   . Type 2 diabetes mellitus without complications (HCC) 12/02/2015    Past Surgical History  Procedure Laterality Date  . Cholecystectomy    . Knee arthroscopy  2000    Left  . Angioplasty  1997  . Pacemaker insertion  2002    generator change MDT by Dr  Graciela Husbands in 2012  . Coronary artery bypass graft N/A 12/01/2013    Procedure: CORONARY ARTERY BYPASS GRAFT TIMES FOUR USING LEFT INTERNAL MAMMARY ARTERY TO LAD, SAPHENOUS VEIN GRAFTS TO DIAGONAL, CIRCUMFELX AND POSTERIOR DESCENDING;  Surgeon: Kerin Perna, MD;  Location: [facility] OR;  Service: Open Heart Surgery;  Laterality: N/A;  . Esophagogastroduodenoscopy (egd) with propofol N/A 05/05/2014    Procedure: ESOPHAGOGASTRODUODENOSCOPY (EGD) WITH PROPOFOL;  Surgeon: Charolett Bumpers, MD;  Location: [facility] ENDOSCOPY;  Service: Endoscopy;  Laterality: N/A;  . Colonoscopy with propofol N/A 05/05/2014    Procedure: COLONOSCOPY WITH PROPOFOL;  Surgeon: Charolett Bumpers, MD;  Location: [facility] ENDOSCOPY;  Service: Endoscopy;  Laterality: N/A;  . Left heart catheterization with coronary angiogram N/A 11/25/2013    Procedure: LEFT HEART CATHETERIZATION WITH CORONARY ANGIOGRAM;  Surgeon: [name], MD;  Location: [facility] CATH LAB;  Service: Cardiovascular;  Laterality: N/A;  . Left and right heart catheterization with coronary/graft angiogram N/A 07/07/2014    Procedure: LEFT AND RIGHT HEART CATHETERIZATION WITH Isabel Caprice;  Surgeon: Kathleene Hazel, MD;  Location: [facility] CATH LAB;  Service: Cardiovascular;  Laterality: N/A;  . Tee without cardioversion N/A 08/31/2014    Procedure: TRANSESOPHAGEAL ECHOCARDIOGRAM (TEE);  Surgeon: [name], MD;  Location: [facility] ENDOSCOPY;  Service: Cardiovascular;  Laterality: N/A;    Current Medications: Outpatient Prescriptions Prior to Visit  Medication Sig Dispense Refill  . aspirin EC 81 MG EC tablet Take 1 tablet (81 mg total) by mouth daily. (Patient taking differently: Take 81 mg by mouth every morning. )    . atorvastatin (LIPITOR) 20 MG tablet Take 20 mg by mouth daily at 6 PM.     . carvedilol (COREG) 25 MG tablet Take 0.5 tablets (12.5 mg total) by mouth 2 (two) times daily with a meal. 60 tablet 6  . Cholecalciferol (VITAMIN D) 2000 UNITS tablet Take 2,000 Units by  mouth daily.    . Cyanocobalamin (VITAMIN B-12 PO) Take 1 tablet by mouth every morning.    . docusate sodium (COLACE) 100 MG capsule Take 100 mg by mouth 2 (two) times daily as needed for mild constipation.    . fexofenadine (ALLEGRA) 180 MG tablet Take 180 mg by mouth every morning.     . fluticasone (FLONASE) 50 MCG/ACT nasal spray Place 2 sprays into the nose every morning.     . furosemide (LASIX) 20 MG tablet take 1 tablet by mouth twice a day TAKE WITH 80 MG TO MAKE 100 MG TOTAL 180 tablet 1  . furosemide (LASIX) 80 MG tablet take 1 tablet by mouth twice a day TAKE WITH 20 MG TO MAKE A TOTAL OF 100 MG TWICE A DAY 180 tablet 1  . Glucosamine-Chondroit-Vit C-Mn (GLUCOSAMINE CHONDR 1500 COMPLX) CAPS Take 1  capsule by mouth every morning.     . hydrALAZINE (APRESOLINE) 25 MG tablet take 1 tablet by mouth three times a day 90 tablet 5  . isosorbide mononitrate (IMDUR) 30 MG 24 hr tablet Take 1 tablet (30 mg total) by mouth daily. 30 tablet 11  . L-Lysine 500 MG CAPS Take 1 capsule by mouth every morning.     . meclizine (ANTIVERT) 25 MG tablet Take 25 mg by mouth daily as needed for dizziness.     . Multiple Vitamin (MULTIVITAMIN) tablet Take 1 tablet by mouth every morning.     . nitroGLYCERIN (NITROSTAT) 0.4 MG SL tablet Place 1 tablet (0.4 mg total) under the tongue every 5 (five) minutes as needed for chest pain. (Patient taking differently: Place 0.4 mg under the tongue every 5 (five) minutes as needed for chest pain (MAX 3 TABLETS). ) 30 tablet 3  . ONE TOUCH ULTRA TEST test strip 1 each by Other route every morning.     Letta Pate DELICA LANCETS FINE MISC 1 each by Other route every morning.     . pantoprazole (PROTONIX) 40 MG tablet Take 40 mg by mouth every morning.     . potassium chloride SA (K-DUR,KLOR-CON) 20 MEQ tablet take 2 tablets by mouth twice a day 360 tablet 1  . RA NATURAL MAGNESIUM 250 MG TABS TAKE 2 TABLETS TWICE DAILY. 120 tablet 5  . spironolactone (ALDACTONE) 25 MG  tablet take 1 tablet by mouth once daily 90 tablet 1  . warfarin (COUMADIN) 5 MG tablet Take as directed by coumadin clinic 120 tablet 1   No facility-administered medications prior to visit.     Allergies:   Acetaminophen-codeine; Amoxicillin; and Diovan   Social History   Social History  . Marital Status: Married    Spouse Name: N/A  . Number of Children: N/A  . Years of Education: N/A   Social History Main Topics  . Smoking status: Former Smoker -- 2.00 packs/day for 5 years    Types: Cigarettes    Quit date: 05/29/1966  . Smokeless tobacco: Never Used  . Alcohol Use: 0.0 oz/week    0 Standard drinks or equivalent per week     Comment: ocassional  . Drug Use: No  . Sexual Activity: Not Asked   Other Topics Concern  . None   Social History Narrative   Lives in [place] with spouse.  Retired Medical illustrator     Family History:  The patient's family history includes Colon cancer in his mother; Heart attack in his brother and mother; Leukemia in his father.   ROS:   Please see the history of present illness.    ROS All other systems reviewed and are negative.   PHYSICAL EXAM:   VS:  BP 126/64 mmHg  Pulse 84  Ht 5' 7.5" (1.715 m)  Wt 239 lb (108.41 kg)  BMI 36.86 kg/m2   GEN: Well nourished, well developed, in no acute distress HEENT: normal Neck: no JVD, carotid bruits, or masses Cardiac: RRR; no rubs, or gallops,no edema.  Intact distal pulses bilaterally. 2/6 SM at RUSB to LLSB Respiratory:  clear to auscultation bilaterally, normal work of breathing GI: soft, nontender, nondistended, + BS MS: no deformity or atrophy Skin: warm and dry, no rash Neuro:  Alert and Oriented x 3, Strength and sensation are intact Psych: euthymic mood, full affect  Wt Readings from Last 3 Encounters:  12/02/15 239 lb (108.41 kg)  06/02/15 241 lb (109.317 kg)  01/26/15 240  lb 12.8 oz (109.226 kg)      Studies/Labs Reviewed:   EKG:  EKG is not ordered today.    Recent  Labs: 01/26/2015: BUN 29*; Creatinine, Ser 1.53*; Potassium 4.5; Sodium 136 06/04/2015: ALT 25; Hemoglobin 16.2; Platelets 155   Lipid Panel    Component Value Date/Time   CHOL 154 06/04/2015 1216   CHOL 113 07/22/2014 1226   TRIG 196* 06/04/2015 1216   TRIG 91 07/22/2014 1226   HDL 45 06/04/2015 1216   HDL 40 07/22/2014 1226   CHOLHDL 3.4 06/04/2015 1216   VLDL 39* 06/04/2015 1216   LDLCALC 70 06/04/2015 1216   LDLCALC 55 07/22/2014 1226   LDLDIRECT 47.9 07/14/2013 0937    Additional studies/ records that were reviewed today include:  none    ASSESSMENT:    1. AS (aortic stenosis)   2. PAF (paroxysmal atrial fibrillation) (HCC)   3. CAD s/p PCI 1997/PTCA 2002/CABG 11/2013   4. Chronic combined systolic and diastolic CHF (congestive heart failure) (HCC)   5. Essential (primary) hypertension   6. Dyslipidemia      PLAN:  In order of problems listed above:  1. Moderate AS - asymptomatic.  Repeat echo 01/2016 2. PAF - maintaining NSR.  Continue BB and warfarin. 3. ASCAD s/p PCI and CABG.  He has not had any angina.  Continue ASA/statin/BB/long acting nitrate. 4. Chronic combined systolic/diastolic CHF with EF 35-40% by last echo.  He appears euvolemic on exam. Continue BB/diuretics/hydralazine/long acting nitrate . 5. HTN - BP controlled on current medical regimen.  Continue BB and hydralazine 6. Dyslipidemia - LDL goal < 70.  Continue statin.  Last LDL 70.  Repeat FLP and ALT.     Medication Adjustments/Labs and Tests Ordered: Current medicines are reviewed at length with the patient today.  Concerns regarding medicines are outlined above.  Medication changes, Labs and Tests ordered today are listed in the Patient Instructions below.  There are no Patient Instructions on file for this visit.   Signed, Armanda Magic, MD  12/02/2015 3:36 PM    [facility] [address] [place], [place], [place]  [address] Phone: [phone]; Fax: [phone]

## 2015-12-02 NOTE — Patient Instructions (Signed)
Medication Instructions:  Your physician recommends that you continue on your current medications as directed. Please refer to the Current Medication list given to you today.   Labwork: None  Testing/Procedures: Your physician has requested that you have an echocardiogram in September, 2017. Echocardiography is a painless test that uses sound waves to create images of your heart. It provides your doctor with information about the size and shape of your heart and how well your heart's chambers and valves are working. This procedure takes approximately one hour. There are no restrictions for this procedure.   Follow-Up: Your physician wants you to follow-up in: 6 months with Dr. [name]. You will receive a reminder letter in the mail two months in advance. If you don't receive a letter, please call our office to schedule the follow-up appointment.   Any Other Special Instructions Will Be Listed Below (If Applicable).     If you need a refill on your cardiac medications before your next appointment, please call your pharmacy.

## 2015-12-03 ENCOUNTER — Encounter (HOSPITAL_COMMUNITY)
Admission: RE | Admit: 2015-12-03 | Discharge: 2015-12-03 | Disposition: A | Discharge status: Home / Self Care | Payer: Self-pay | Source: Ambulatory Visit | Attending: Cardiology | Admitting: Cardiology

## 2015-12-06 ENCOUNTER — Encounter (HOSPITAL_COMMUNITY): Payer: Self-pay

## 2015-12-08 ENCOUNTER — Encounter (HOSPITAL_COMMUNITY): Payer: Self-pay

## 2015-12-10 ENCOUNTER — Encounter (HOSPITAL_COMMUNITY): Payer: Self-pay

## 2015-12-13 ENCOUNTER — Encounter (HOSPITAL_COMMUNITY)
Admission: RE | Admit: 2015-12-13 | Discharge: 2015-12-13 | Disposition: A | Discharge status: Home / Self Care | Payer: Self-pay | Source: Ambulatory Visit | Attending: Cardiology | Admitting: Cardiology

## 2015-12-15 ENCOUNTER — Encounter (HOSPITAL_COMMUNITY)
Admission: RE | Admit: 2015-12-15 | Discharge: 2015-12-15 | Disposition: A | Discharge status: Home / Self Care | Payer: Self-pay | Source: Ambulatory Visit | Attending: Cardiology | Admitting: Cardiology

## 2015-12-15 ENCOUNTER — Encounter: Payer: Self-pay | Admitting: Cardiology

## 2015-12-17 ENCOUNTER — Encounter (HOSPITAL_COMMUNITY)
Admission: RE | Admit: 2015-12-17 | Discharge: 2015-12-17 | Disposition: A | Discharge status: Home / Self Care | Payer: Self-pay | Source: Ambulatory Visit | Attending: Cardiology | Admitting: Cardiology

## 2015-12-20 ENCOUNTER — Other Ambulatory Visit: Payer: Self-pay | Admitting: Cardiology

## 2015-12-20 ENCOUNTER — Encounter (HOSPITAL_COMMUNITY)
Admission: RE | Admit: 2015-12-20 | Discharge: 2015-12-20 | Disposition: A | Discharge status: Home / Self Care | Payer: Self-pay | Source: Ambulatory Visit | Attending: Cardiology | Admitting: Cardiology

## 2015-12-21 ENCOUNTER — Other Ambulatory Visit: Payer: Self-pay | Admitting: Cardiology

## 2015-12-21 MED ORDER — SPIRONOLACTONE 25 MG PO TABS: 25.0000 mg | ORAL_TABLET | Freq: Every day | ORAL | 3 refills | Status: DC

## 2015-12-21 MED ORDER — HYDRALAZINE HCL 25 MG PO TABS: 25.0000 mg | ORAL_TABLET | Freq: Three times a day (TID) | ORAL | 11 refills | Status: DC

## 2015-12-22 ENCOUNTER — Encounter (HOSPITAL_COMMUNITY)
Admission: RE | Admit: 2015-12-22 | Discharge: 2015-12-22 | Disposition: A | Discharge status: Home / Self Care | Payer: Self-pay | Source: Ambulatory Visit | Attending: Cardiology | Admitting: Cardiology

## 2015-12-24 ENCOUNTER — Encounter (HOSPITAL_COMMUNITY)
Admission: RE | Admit: 2015-12-24 | Discharge: 2015-12-24 | Disposition: A | Discharge status: Home / Self Care | Payer: Self-pay | Source: Ambulatory Visit | Attending: Cardiology | Admitting: Cardiology

## 2015-12-27 ENCOUNTER — Encounter (HOSPITAL_COMMUNITY)
Admission: RE | Admit: 2015-12-27 | Discharge: 2015-12-27 | Disposition: A | Discharge status: Home / Self Care | Payer: Self-pay | Source: Ambulatory Visit | Attending: Cardiology | Admitting: Cardiology

## 2015-12-29 ENCOUNTER — Encounter (HOSPITAL_COMMUNITY)
Admission: RE | Admit: 2015-12-29 | Discharge: 2015-12-29 | Disposition: A | Discharge status: Home / Self Care | Payer: Self-pay | Source: Ambulatory Visit | Attending: Cardiology | Admitting: Cardiology

## 2015-12-29 DIAGNOSIS — Z951 Presence of aortocoronary bypass graft: Secondary | ICD-10-CM | POA: Insufficient documentation

## 2015-12-31 ENCOUNTER — Encounter (HOSPITAL_COMMUNITY)
Admission: RE | Admit: 2015-12-31 | Discharge: 2015-12-31 | Disposition: A | Discharge status: Home / Self Care | Payer: Self-pay | Source: Ambulatory Visit | Attending: Cardiology | Admitting: Cardiology

## 2016-01-03 ENCOUNTER — Encounter (HOSPITAL_COMMUNITY)
Admission: RE | Admit: 2016-01-03 | Discharge: 2016-01-03 | Disposition: A | Discharge status: Home / Self Care | Payer: Self-pay | Source: Ambulatory Visit | Attending: Cardiology | Admitting: Cardiology

## 2016-01-05 ENCOUNTER — Encounter (HOSPITAL_COMMUNITY)
Admission: RE | Admit: 2016-01-05 | Discharge: 2016-01-05 | Disposition: A | Discharge status: Home / Self Care | Payer: Self-pay | Source: Ambulatory Visit | Attending: Cardiology | Admitting: Cardiology

## 2016-01-07 ENCOUNTER — Ambulatory Visit (INDEPENDENT_AMBULATORY_CARE_PROVIDER_SITE_OTHER): Payer: Medicare Other

## 2016-01-07 ENCOUNTER — Encounter (HOSPITAL_COMMUNITY)
Admission: RE | Admit: 2016-01-07 | Discharge: 2016-01-07 | Disposition: A | Discharge status: Home / Self Care | Payer: Self-pay | Source: Ambulatory Visit | Attending: Cardiology | Admitting: Cardiology

## 2016-01-07 DIAGNOSIS — I480 Paroxysmal atrial fibrillation: Secondary | ICD-10-CM

## 2016-01-07 DIAGNOSIS — Z5181 Encounter for therapeutic drug level monitoring: Secondary | ICD-10-CM

## 2016-01-07 DIAGNOSIS — I4891 Unspecified atrial fibrillation: Secondary | ICD-10-CM

## 2016-01-07 LAB — POCT INR: INR: 2.7

## 2016-01-10 ENCOUNTER — Encounter (HOSPITAL_COMMUNITY)
Admission: RE | Admit: 2016-01-10 | Discharge: 2016-01-10 | Disposition: A | Discharge status: Home / Self Care | Payer: Self-pay | Source: Ambulatory Visit | Attending: Cardiology | Admitting: Cardiology

## 2016-01-12 ENCOUNTER — Encounter (HOSPITAL_COMMUNITY)
Admission: RE | Admit: 2016-01-12 | Discharge: 2016-01-12 | Disposition: A | Discharge status: Home / Self Care | Payer: Self-pay | Source: Ambulatory Visit | Attending: Cardiology | Admitting: Cardiology

## 2016-01-13 ENCOUNTER — Encounter: Payer: Self-pay | Admitting: Internal Medicine

## 2016-01-14 ENCOUNTER — Encounter (HOSPITAL_COMMUNITY)
Admission: RE | Admit: 2016-01-14 | Discharge: 2016-01-14 | Disposition: A | Discharge status: Home / Self Care | Payer: Self-pay | Source: Ambulatory Visit | Attending: Cardiology | Admitting: Cardiology

## 2016-01-17 ENCOUNTER — Encounter (HOSPITAL_COMMUNITY)
Admission: RE | Admit: 2016-01-17 | Discharge: 2016-01-17 | Disposition: A | Discharge status: Home / Self Care | Payer: Self-pay | Source: Ambulatory Visit | Attending: Cardiology | Admitting: Cardiology

## 2016-01-18 ENCOUNTER — Other Ambulatory Visit: Payer: Self-pay | Admitting: Cardiology

## 2016-01-19 ENCOUNTER — Encounter (HOSPITAL_COMMUNITY)
Admission: RE | Admit: 2016-01-19 | Discharge: 2016-01-19 | Disposition: A | Discharge status: Home / Self Care | Payer: Self-pay | Source: Ambulatory Visit | Attending: Cardiology | Admitting: Cardiology

## 2016-01-21 ENCOUNTER — Encounter (HOSPITAL_COMMUNITY): Payer: Self-pay

## 2016-01-24 ENCOUNTER — Encounter (HOSPITAL_COMMUNITY): Payer: Self-pay

## 2016-01-26 ENCOUNTER — Encounter (HOSPITAL_COMMUNITY): Payer: Self-pay

## 2016-01-27 ENCOUNTER — Encounter: Payer: Medicare Other | Admitting: Internal Medicine

## 2016-01-28 ENCOUNTER — Encounter (HOSPITAL_COMMUNITY): Payer: Self-pay

## 2016-01-28 DIAGNOSIS — Z951 Presence of aortocoronary bypass graft: Secondary | ICD-10-CM | POA: Insufficient documentation

## 2016-02-02 ENCOUNTER — Encounter (HOSPITAL_COMMUNITY)
Admission: RE | Admit: 2016-02-02 | Discharge: 2016-02-02 | Disposition: A | Discharge status: Home / Self Care | Payer: Self-pay | Source: Ambulatory Visit | Attending: Cardiology | Admitting: Cardiology

## 2016-02-04 ENCOUNTER — Encounter (HOSPITAL_COMMUNITY)
Admission: RE | Admit: 2016-02-04 | Discharge: 2016-02-04 | Disposition: A | Discharge status: Home / Self Care | Payer: Self-pay | Source: Ambulatory Visit | Attending: Cardiology | Admitting: Cardiology

## 2016-02-07 ENCOUNTER — Ambulatory Visit (HOSPITAL_COMMUNITY): Payer: Medicare Other | Attending: Internal Medicine

## 2016-02-07 ENCOUNTER — Encounter (HOSPITAL_COMMUNITY)
Admission: RE | Admit: 2016-02-07 | Discharge: 2016-02-07 | Disposition: A | Discharge status: Home / Self Care | Payer: Self-pay | Source: Ambulatory Visit | Attending: Cardiology | Admitting: Cardiology

## 2016-02-07 ENCOUNTER — Encounter (INDEPENDENT_AMBULATORY_CARE_PROVIDER_SITE_OTHER): Payer: Self-pay

## 2016-02-07 DIAGNOSIS — Z6836 Body mass index (BMI) 36.0-36.9, adult: Secondary | ICD-10-CM | POA: Diagnosis not present

## 2016-02-07 DIAGNOSIS — I350 Nonrheumatic aortic (valve) stenosis: Secondary | ICD-10-CM | POA: Insufficient documentation

## 2016-02-07 DIAGNOSIS — E785 Hyperlipidemia, unspecified: Secondary | ICD-10-CM | POA: Diagnosis not present

## 2016-02-07 DIAGNOSIS — R29898 Other symptoms and signs involving the musculoskeletal system: Secondary | ICD-10-CM | POA: Insufficient documentation

## 2016-02-07 DIAGNOSIS — N189 Chronic kidney disease, unspecified: Secondary | ICD-10-CM | POA: Diagnosis not present

## 2016-02-07 DIAGNOSIS — I358 Other nonrheumatic aortic valve disorders: Secondary | ICD-10-CM | POA: Diagnosis not present

## 2016-02-07 DIAGNOSIS — E1122 Type 2 diabetes mellitus with diabetic chronic kidney disease: Secondary | ICD-10-CM | POA: Insufficient documentation

## 2016-02-07 DIAGNOSIS — E669 Obesity, unspecified: Secondary | ICD-10-CM | POA: Diagnosis not present

## 2016-02-07 DIAGNOSIS — G4733 Obstructive sleep apnea (adult) (pediatric): Secondary | ICD-10-CM | POA: Diagnosis not present

## 2016-02-07 DIAGNOSIS — I1310 Hypertensive heart and chronic kidney disease without heart failure, with stage 1 through stage 4 chronic kidney disease, or unspecified chronic kidney disease: Secondary | ICD-10-CM | POA: Diagnosis not present

## 2016-02-07 DIAGNOSIS — Z951 Presence of aortocoronary bypass graft: Secondary | ICD-10-CM | POA: Diagnosis not present

## 2016-02-09 ENCOUNTER — Encounter (HOSPITAL_COMMUNITY)
Admission: RE | Admit: 2016-02-09 | Discharge: 2016-02-09 | Disposition: A | Discharge status: Home / Self Care | Payer: Self-pay | Source: Ambulatory Visit | Attending: Cardiology | Admitting: Cardiology

## 2016-02-11 ENCOUNTER — Encounter (HOSPITAL_COMMUNITY)
Admission: RE | Admit: 2016-02-11 | Discharge: 2016-02-11 | Disposition: A | Discharge status: Home / Self Care | Payer: Self-pay | Source: Ambulatory Visit | Attending: Cardiology | Admitting: Cardiology

## 2016-02-14 ENCOUNTER — Encounter (HOSPITAL_COMMUNITY)
Admission: RE | Admit: 2016-02-14 | Discharge: 2016-02-14 | Disposition: A | Discharge status: Home / Self Care | Payer: Self-pay | Source: Ambulatory Visit | Attending: Cardiology | Admitting: Cardiology

## 2016-02-16 ENCOUNTER — Encounter (HOSPITAL_COMMUNITY)
Admission: RE | Admit: 2016-02-16 | Discharge: 2016-02-16 | Disposition: A | Discharge status: Home / Self Care | Payer: Self-pay | Source: Ambulatory Visit | Attending: Cardiology | Admitting: Cardiology

## 2016-02-18 ENCOUNTER — Encounter (HOSPITAL_COMMUNITY)
Admission: RE | Admit: 2016-02-18 | Discharge: 2016-02-18 | Disposition: A | Discharge status: Home / Self Care | Payer: Self-pay | Source: Ambulatory Visit | Attending: Cardiology | Admitting: Cardiology

## 2016-02-18 ENCOUNTER — Ambulatory Visit (INDEPENDENT_AMBULATORY_CARE_PROVIDER_SITE_OTHER): Payer: Medicare Other | Admitting: *Deleted

## 2016-02-18 DIAGNOSIS — Z5181 Encounter for therapeutic drug level monitoring: Secondary | ICD-10-CM | POA: Diagnosis not present

## 2016-02-18 DIAGNOSIS — I4891 Unspecified atrial fibrillation: Secondary | ICD-10-CM

## 2016-02-18 DIAGNOSIS — I480 Paroxysmal atrial fibrillation: Secondary | ICD-10-CM

## 2016-02-18 LAB — POCT INR: INR: 1.8

## 2016-02-21 ENCOUNTER — Encounter (HOSPITAL_COMMUNITY)
Admission: RE | Admit: 2016-02-21 | Discharge: 2016-02-21 | Disposition: A | Discharge status: Home / Self Care | Payer: Self-pay | Source: Ambulatory Visit | Attending: Cardiology | Admitting: Cardiology

## 2016-02-23 ENCOUNTER — Encounter (HOSPITAL_COMMUNITY)
Admission: RE | Admit: 2016-02-23 | Discharge: 2016-02-23 | Disposition: A | Discharge status: Home / Self Care | Payer: Self-pay | Source: Ambulatory Visit | Attending: Cardiology | Admitting: Cardiology

## 2016-02-25 ENCOUNTER — Encounter (HOSPITAL_COMMUNITY)
Admission: RE | Admit: 2016-02-25 | Discharge: 2016-02-25 | Disposition: A | Discharge status: Home / Self Care | Payer: Self-pay | Source: Ambulatory Visit | Attending: Cardiology | Admitting: Cardiology

## 2016-02-28 ENCOUNTER — Encounter (HOSPITAL_COMMUNITY)
Admission: RE | Admit: 2016-02-28 | Discharge: 2016-02-28 | Disposition: A | Discharge status: Home / Self Care | Payer: Self-pay | Source: Ambulatory Visit | Attending: Cardiology | Admitting: Cardiology

## 2016-02-28 DIAGNOSIS — Z951 Presence of aortocoronary bypass graft: Secondary | ICD-10-CM | POA: Insufficient documentation

## 2016-03-01 ENCOUNTER — Encounter (HOSPITAL_COMMUNITY)
Admission: RE | Admit: 2016-03-01 | Discharge: 2016-03-01 | Disposition: A | Discharge status: Home / Self Care | Payer: Self-pay | Source: Ambulatory Visit | Attending: Cardiology | Admitting: Cardiology

## 2016-03-03 ENCOUNTER — Encounter (HOSPITAL_COMMUNITY)
Admission: RE | Admit: 2016-03-03 | Discharge: 2016-03-03 | Disposition: A | Discharge status: Home / Self Care | Payer: Self-pay | Source: Ambulatory Visit | Attending: Cardiology | Admitting: Cardiology

## 2016-03-08 ENCOUNTER — Encounter (HOSPITAL_COMMUNITY)
Admission: RE | Admit: 2016-03-08 | Discharge: 2016-03-08 | Disposition: A | Discharge status: Home / Self Care | Payer: Self-pay | Source: Ambulatory Visit | Attending: Cardiology | Admitting: Cardiology

## 2016-03-10 ENCOUNTER — Encounter (HOSPITAL_COMMUNITY)
Admission: RE | Admit: 2016-03-10 | Discharge: 2016-03-10 | Disposition: A | Discharge status: Home / Self Care | Payer: Self-pay | Source: Ambulatory Visit | Attending: Cardiology | Admitting: Cardiology

## 2016-03-13 ENCOUNTER — Ambulatory Visit (INDEPENDENT_AMBULATORY_CARE_PROVIDER_SITE_OTHER): Payer: Medicare Other | Admitting: *Deleted

## 2016-03-13 ENCOUNTER — Encounter: Payer: Self-pay | Admitting: Internal Medicine

## 2016-03-13 ENCOUNTER — Ambulatory Visit (INDEPENDENT_AMBULATORY_CARE_PROVIDER_SITE_OTHER): Payer: Medicare Other | Admitting: Internal Medicine

## 2016-03-13 ENCOUNTER — Encounter (HOSPITAL_COMMUNITY)
Admission: RE | Admit: 2016-03-13 | Discharge: 2016-03-13 | Disposition: A | Discharge status: Home / Self Care | Payer: Self-pay | Source: Ambulatory Visit | Attending: Cardiology | Admitting: Cardiology

## 2016-03-13 VITALS — BP 114/80 | HR 79 | Ht 67.0 in | Wt 241.8 lb

## 2016-03-13 DIAGNOSIS — I4891 Unspecified atrial fibrillation: Secondary | ICD-10-CM | POA: Diagnosis not present

## 2016-03-13 DIAGNOSIS — I350 Nonrheumatic aortic (valve) stenosis: Secondary | ICD-10-CM

## 2016-03-13 DIAGNOSIS — I5022 Chronic systolic (congestive) heart failure: Secondary | ICD-10-CM

## 2016-03-13 DIAGNOSIS — Z950 Presence of cardiac pacemaker: Secondary | ICD-10-CM | POA: Diagnosis not present

## 2016-03-13 DIAGNOSIS — I480 Paroxysmal atrial fibrillation: Secondary | ICD-10-CM

## 2016-03-13 DIAGNOSIS — I495 Sick sinus syndrome: Secondary | ICD-10-CM

## 2016-03-13 DIAGNOSIS — I429 Cardiomyopathy, unspecified: Secondary | ICD-10-CM

## 2016-03-13 DIAGNOSIS — Z5181 Encounter for therapeutic drug level monitoring: Secondary | ICD-10-CM

## 2016-03-13 LAB — CUP PACEART INCLINIC DEVICE CHECK
Battery Voltage: 2.78 V
Brady Statistic AP VS Percent: 43 %
Implantable Lead Implant Date: 20020926
Implantable Lead Location: 753859
Implantable Lead Model: 5076
Implantable Lead Model: 5092
Lead Channel Impedance Value: 526 Ohm
Lead Channel Pacing Threshold Amplitude: 0.5 V
Lead Channel Pacing Threshold Amplitude: 0.5 V
Lead Channel Pacing Threshold Amplitude: 0.75 V
Lead Channel Pacing Threshold Amplitude: 1.125 V
Lead Channel Pacing Threshold Pulse Width: 0.4 ms
Lead Channel Sensing Intrinsic Amplitude: 2 mV
Lead Channel Setting Pacing Pulse Width: 0.4 ms
MDC IDC LEAD IMPLANT DT: 20020926
MDC IDC LEAD LOCATION: 753860
MDC IDC MSMT BATTERY IMPEDANCE: 256 Ohm
MDC IDC MSMT BATTERY REMAINING LONGEVITY: 121 mo
MDC IDC MSMT LEADCHNL RA PACING THRESHOLD PULSEWIDTH: 0.4 ms
MDC IDC MSMT LEADCHNL RA PACING THRESHOLD PULSEWIDTH: 0.4 ms
MDC IDC MSMT LEADCHNL RV IMPEDANCE VALUE: 894 Ohm
MDC IDC MSMT LEADCHNL RV PACING THRESHOLD PULSEWIDTH: 0.4 ms
MDC IDC MSMT LEADCHNL RV SENSING INTR AMPL: 22.4 mV
MDC IDC SESS DTM: 20171016172737
MDC IDC SET LEADCHNL RA PACING AMPLITUDE: 2 V
MDC IDC SET LEADCHNL RV PACING AMPLITUDE: 2.5 V
MDC IDC SET LEADCHNL RV SENSING SENSITIVITY: 5.6 mV
MDC IDC STAT BRADY AP VP PERCENT: 0 %
MDC IDC STAT BRADY AS VP PERCENT: 0 %
MDC IDC STAT BRADY AS VS PERCENT: 56 %

## 2016-03-13 LAB — POCT INR: INR: 1.8

## 2016-03-13 NOTE — Patient Instructions (Addendum)
Medication Instructions: -Your physician recommends that you continue on your current medications as directed. Please refer to the Current Medication list given to you today.  Labwork: - none ordered  Procedures/Testing: - none ordered  Follow-Up: - Remote monitoring is used to monitor your Pacemaker of ICD from home. This monitoring reduces the number of office visits required to check your device to one time per year. It allows us to keep an eye on the functioning of your device to ensure it is working properly. You are scheduled for a device check from home on 06/12/16. You may send your transmission at any time that day. If you have a wireless device, the transmission will be sent automatically. After your physician reviews your transmission, you will receive a postcard with your next transmission date.  - Your physician wants you to follow-up in: 1 year with Renee Ursuy, PA for Dr. Klein. You will receive a reminder letter in the mail two months in advance. If you don't receive a letter, please call our office to schedule the follow-up appointment.  Any Additional Special Instructions Will Be Listed Below (If Applicable).     If you need a refill on your cardiac medications before your next appointment, please call your pharmacy.   

## 2016-03-13 NOTE — Progress Notes (Signed)
Patient Care Team: Juluis Rainier, MD as PCP - General (Family Medicine) [name], MD as Consulting Physician (Cardiology) Duke Salvia, MD as Consulting Physician (Clinical Cardiac Electrophysiology)   HPI  Brandon Klein is a 75 y.o. male Seen in followup for pacemaker implanted in 2002 with generator replacement 2012. He was implanted initially for bradycardia attributed to vasovagal syncope.  He has a history of coronary disease and underwent stenting of his LAD 1997 and cutting balloon angioplasty of the circumflex in 2002. Echocardiogram 2013 demonstrated normal left ventricular function. Myoview 2008 with nonischemic He was admitted to/16 with acute on chronic heart failure. Echocardiogram 1/16 demonstrated an ejection fraction of 20%. He underwent catheterization which demonstrated patent vein graft to the OM the diagonal and the LIMA. His vein graft to the PDA was occluded.  Echocardiogram demonstrated aortic stenosis which by TEE in dobutamine was felt to be only moderate. Mean gradient was 20. With dobutamine increased to 37 mm. Repeat ejection fraction 3 months following initial hospitalization was 25-30%.  He saw Dr. Johney Frame 5/16. A narrow QRS was identified, as such, he did not meet criteria for CRT.  DATE TEST    6/13 ECHO eF 55%    5/16 ECHOq EF 25-30%   9/16    Echo    EF 35-40 %   9/17    echo   EF 40 %    He denies nocturnal dyspnea orthopnea or peripheral edema or chest discomfort. He has had no syncope.  He has a history of PAF. He is on Coumadin. No bleeding issues    Past Medical History:  Diagnosis Date  . AS (aortic stenosis)    mild by echo 10/2013  . Carotid artery plaque    a. Duplex 10/2013: mild calcific plaque origin ICA. Left: intimal wall thickening CCA. 0-39% BICA.  Marland Kitchen Chronic renal insufficiency   . Chronic systolic dysfunction of left ventricle   . Coronary artery disease    a. s/p PCI of LAD 1997. b. Cutting balloon angioplasty  to LCx in 2002. c. s/p cath 11/2013 with severe 3 vessel ASCAD s/p CABG with LIMA to LAD, SVG to diag, SVG to left circ and SVG to PDA. d. cath 07/07/2014 occluded SVG to PDA, all other grafts patent. 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 DJD (degenerative joint disease)   . Epistaxis   . GERD (gastroesophageal reflux disease)   . GI bleeding    a.  with benign gastric polypectomy 05/2014.  Marland Kitchen HLD (hyperlipidemia)   . HTN (hypertension)   . Hypokalemia   . Hypomagnesemia   . Impaired fasting glucose    A1C check every year  . Obesity   . OSA (obstructive sleep apnea)   . Pacemaker    a. 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 b. MDT generator replacement 2012.  Marland Kitchen PAF (paroxysmal atrial fibrillation) (HCC)    on chronic systemic anticoagulation  . PVC's (premature ventricular contractions)   . Syncope    a. vasovagal with documented bradycardia.  . Type 2 diabetes mellitus without complications (HCC) 12/02/2015    Past Surgical History:  Procedure Laterality Date  . ANGIOPLASTY  1997  . CHOLECYSTECTOMY    . COLONOSCOPY WITH PROPOFOL N/A 05/05/2014   Procedure: COLONOSCOPY WITH PROPOFOL;  Surgeon: Charolett Bumpers, MD;  Location: [facility] ENDOSCOPY;  Service: Endoscopy;  Laterality: N/A;  . CORONARY ARTERY BYPASS GRAFT N/A 12/01/2013   Procedure: CORONARY ARTERY BYPASS GRAFT  TIMES FOUR USING LEFT INTERNAL MAMMARY ARTERY TO LAD, SAPHENOUS VEIN GRAFTS TO DIAGONAL, CIRCUMFELX AND POSTERIOR DESCENDING;  Surgeon: Kerin PernaPeter Van Trigt, MD;  Location: [facility] OR;  Service: Open Heart Surgery;  Laterality: N/A;  . ESOPHAGOGASTRODUODENOSCOPY (EGD) WITH PROPOFOL N/A 05/05/2014   Procedure: ESOPHAGOGASTRODUODENOSCOPY (EGD) WITH PROPOFOL;  Surgeon: Charolett BumpersMartin K Johnson, MD;  Location: [facility] ENDOSCOPY;  Service: Endoscopy;  Laterality: N/A;  . KNEE ARTHROSCOPY  2000   Left  . LEFT AND RIGHT HEART CATHETERIZATION WITH CORONARY/GRAFT ANGIOGRAM N/A 07/07/2014   Procedure: LEFT AND RIGHT HEART CATHETERIZATION WITH  Isabel CapriceORONARY/GRAFT ANGIOGRAM;  Surgeon: Kathleene Hazelhristopher D McAlhany, MD;  [facility];  Service: Cardiovascular;  Laterality: N/A;  . LEFT HEART CATHETERIZATION WITH CORONARY ANGIOGRAM N/A 11/25/2013   Procedure: LEFT HEART CATHETERIZATION WITH CORONARY ANGIOGRAM;  Surgeon: Quintella Reichertraci R Turner, MD;  Location: [facility] CATH LAB;  Service: Cardiovascular;  Laterality: N/A;  . PACEMAKER INSERTION  2002   generator change MDT by Dr Graciela HusbandsKlein in 2012  . TEE WITHOUT CARDIOVERSION N/A 08/31/2014   Procedure: TRANSESOPHAGEAL ECHOCARDIOGRAM (TEE);  Surgeon: Quintella Reichertraci R Turner, MD;  [facility];  Service: Cardiovascular;  Laterality: N/A;    Current Outpatient Prescriptions  Medication Sig Dispense Refill  . aspirin EC 81 MG EC tablet Take 1 tablet (81 mg total) by mouth daily. (Patient taking differently: Take 81 mg by mouth every morning. )    . atorvastatin (LIPITOR) 20 MG tablet Take 20 mg by mouth daily at 6 PM.     . carvedilol (COREG) 25 MG tablet Take 0.5 tablets (12.5 mg total) by mouth 2 (two) times daily with a meal. 60 tablet 6  . Cholecalciferol (VITAMIN D) 2000 UNITS tablet Take 2,000 Units by mouth daily.    . Cyanocobalamin (VITAMIN B-12 PO) Take 1 tablet by mouth every morning.    . docusate sodium (COLACE) 100 MG capsule Take 100 mg by mouth 2 (two) times daily as needed for mild constipation.    . fexofenadine (ALLEGRA) 180 MG tablet Take 180 mg by mouth every morning.     . fluticasone (FLONASE) 50 MCG/ACT nasal spray Place 2 sprays into the nose every morning.     . furosemide (LASIX) 20 MG tablet take 1 tablet by mouth twice a day TAKE WITH 80 MG TO MAKE 100 MG TOTAL 180 tablet 1  . furosemide (LASIX) 80 MG tablet take 1 tablet by mouth twice a day TAKE WITH 20 MG TO MAKE A TOTAL OF 100 MG TWICE A DAY 180 tablet 1  . Glucosamine-Chondroit-Vit C-Mn (GLUCOSAMINE CHONDR 1500 COMPLX) CAPS Take 1 capsule by mouth every morning.     . hydrALAZINE (APRESOLINE) 25 MG tablet Take 1 tablet (25 mg  total) by mouth 3 (three) times daily. 90 tablet 11  . isosorbide mononitrate (IMDUR) 30 MG 24 hr tablet TAKE 1 TABLET BY MOUTH ONCE DAILY 30 tablet 11  . L-Lysine 500 MG CAPS Take 1 capsule by mouth every morning.     . Magnesium Oxide -Mg Supplement 250 MG TABS TAKE 2 TABLETS TWICE DAILY 120 tablet 3  . meclizine (ANTIVERT) 25 MG tablet Take 25 mg by mouth daily as needed for dizziness.     . Multiple Vitamin (MULTIVITAMIN) tablet Take 1 tablet by mouth every morning.     . nitroGLYCERIN (NITROSTAT) 0.4 MG SL tablet Place 1 tablet (0.4 mg total) under the tongue every 5 (five) minutes as needed for chest pain. (Patient taking differently: Place 0.4 mg under the tongue every  5 (five) minutes as needed for chest pain (MAX 3 TABLETS). ) 30 tablet 3  . ONE TOUCH ULTRA TEST test strip 1 each by Other route every morning.     Letta Pate DELICA LANCETS FINE MISC 1 each by Other route every morning.     . pantoprazole (PROTONIX) 40 MG tablet Take 40 mg by mouth every morning.     . potassium chloride SA (K-DUR,KLOR-CON) 20 MEQ tablet take 2 tablets by mouth twice a day 360 tablet 1  . RA NATURAL MAGNESIUM 250 MG TABS TAKE 2 TABLETS TWICE DAILY. 120 tablet 5  . spironolactone (ALDACTONE) 25 MG tablet Take 1 tablet (25 mg total) by mouth daily. 90 tablet 3  . warfarin (COUMADIN) 5 MG tablet Take as directed by coumadin clinic 120 tablet 1   No current facility-administered medications for this visit.     Allergies  Allergen Reactions  . Acetaminophen-Codeine Other (See Comments)    Extreme constipation   . Amoxicillin Itching and Rash    Unknown-maybe a rash?  . Diovan [Valsartan] Other (See Comments)    Unknown     Review of Systems negative except from HPI and PMH  Physical Exam BP 114/80   Pulse 79   Ht 5\' 7"  (1.702 m)   Wt 241 lb 12.8 oz (109.7 kg)   SpO2 96%   BMI 37.87 kg/m  Well developed and nourished in no acute distress HENT normal Neck supple with JVP-flat Clear Regular  rate and rhythm, no murmurs or gallops Abd-soft with active BS No Clubbing cyanosis edema Skin-warm and dry A & Oriented  Grossly normal sensory and motor function  ECG was ordered today and demonstratedSinus rhythm at 74 intervals 20/11/40 axis left at -15   Assessment and  Plan  Atrial fibrillation  Coronary artery disease with prior PCI and bypass surgery 2015  Pacemaker -[employer]   The patient's device was interrogated.  The information was reviewed.  Device was reprogrammed to decrease the ADL rate and percentage and increase his exertion percentage  Congestive heart failure-chronic-class II  Cardiomyopathy question mechanism with concomitant coronary disease  Aortic stenosis-moderate  Sinus node dysfunction  Without symptoms of ischemia  Euvolemic continue current meds  No intercurrent atrial fibrillation or flutter  On Anticoagulation;  No bleeding issues

## 2016-03-15 ENCOUNTER — Encounter (HOSPITAL_COMMUNITY)
Admission: RE | Admit: 2016-03-15 | Discharge: 2016-03-15 | Disposition: A | Discharge status: Home / Self Care | Payer: Self-pay | Source: Ambulatory Visit | Attending: Cardiology | Admitting: Cardiology

## 2016-03-17 ENCOUNTER — Encounter (HOSPITAL_COMMUNITY): Payer: Self-pay

## 2016-03-20 ENCOUNTER — Encounter (HOSPITAL_COMMUNITY)
Admission: RE | Admit: 2016-03-20 | Discharge: 2016-03-20 | Disposition: A | Discharge status: Home / Self Care | Payer: Self-pay | Source: Ambulatory Visit | Attending: Cardiology | Admitting: Cardiology

## 2016-03-22 ENCOUNTER — Encounter (HOSPITAL_COMMUNITY)
Admission: RE | Admit: 2016-03-22 | Discharge: 2016-03-22 | Disposition: A | Discharge status: Home / Self Care | Payer: Self-pay | Source: Ambulatory Visit | Attending: Cardiology | Admitting: Cardiology

## 2016-03-24 ENCOUNTER — Encounter (HOSPITAL_COMMUNITY)
Admission: RE | Admit: 2016-03-24 | Discharge: 2016-03-24 | Disposition: A | Discharge status: Home / Self Care | Payer: Self-pay | Source: Ambulatory Visit | Attending: Cardiology | Admitting: Cardiology

## 2016-03-27 ENCOUNTER — Encounter (HOSPITAL_COMMUNITY)
Admission: RE | Admit: 2016-03-27 | Discharge: 2016-03-27 | Disposition: A | Discharge status: Home / Self Care | Payer: Self-pay | Source: Ambulatory Visit | Attending: Cardiology | Admitting: Cardiology

## 2016-03-27 ENCOUNTER — Ambulatory Visit (INDEPENDENT_AMBULATORY_CARE_PROVIDER_SITE_OTHER): Payer: Medicare Other

## 2016-03-27 DIAGNOSIS — Z5181 Encounter for therapeutic drug level monitoring: Secondary | ICD-10-CM

## 2016-03-27 DIAGNOSIS — I480 Paroxysmal atrial fibrillation: Secondary | ICD-10-CM | POA: Diagnosis not present

## 2016-03-27 DIAGNOSIS — I4891 Unspecified atrial fibrillation: Secondary | ICD-10-CM

## 2016-03-27 LAB — POCT INR: INR: 2.3

## 2016-03-29 ENCOUNTER — Encounter (HOSPITAL_COMMUNITY)
Admission: RE | Admit: 2016-03-29 | Discharge: 2016-03-29 | Disposition: A | Discharge status: Home / Self Care | Payer: Self-pay | Source: Ambulatory Visit | Attending: Cardiology | Admitting: Cardiology

## 2016-03-29 DIAGNOSIS — Z951 Presence of aortocoronary bypass graft: Secondary | ICD-10-CM | POA: Insufficient documentation

## 2016-03-31 ENCOUNTER — Encounter (HOSPITAL_COMMUNITY)
Admission: RE | Admit: 2016-03-31 | Discharge: 2016-03-31 | Disposition: A | Discharge status: Home / Self Care | Payer: Self-pay | Source: Ambulatory Visit | Attending: Cardiology | Admitting: Cardiology

## 2016-04-03 ENCOUNTER — Encounter (HOSPITAL_COMMUNITY)
Admission: RE | Admit: 2016-04-03 | Discharge: 2016-04-03 | Disposition: A | Discharge status: Home / Self Care | Payer: Self-pay | Source: Ambulatory Visit | Attending: Cardiology | Admitting: Cardiology

## 2016-04-05 ENCOUNTER — Encounter (HOSPITAL_COMMUNITY)
Admission: RE | Admit: 2016-04-05 | Discharge: 2016-04-05 | Disposition: A | Discharge status: Home / Self Care | Payer: Self-pay | Source: Ambulatory Visit | Attending: Cardiology | Admitting: Cardiology

## 2016-04-07 ENCOUNTER — Encounter (HOSPITAL_COMMUNITY)
Admission: RE | Admit: 2016-04-07 | Discharge: 2016-04-07 | Disposition: A | Discharge status: Home / Self Care | Payer: Self-pay | Source: Ambulatory Visit | Attending: Cardiology | Admitting: Cardiology

## 2016-04-10 ENCOUNTER — Encounter (HOSPITAL_COMMUNITY)
Admission: RE | Admit: 2016-04-10 | Discharge: 2016-04-10 | Disposition: A | Discharge status: Home / Self Care | Payer: Self-pay | Source: Ambulatory Visit | Attending: Cardiology | Admitting: Cardiology

## 2016-04-12 ENCOUNTER — Encounter (HOSPITAL_COMMUNITY): Payer: Self-pay

## 2016-04-14 ENCOUNTER — Encounter (HOSPITAL_COMMUNITY)
Admission: RE | Admit: 2016-04-14 | Discharge: 2016-04-14 | Disposition: A | Discharge status: Home / Self Care | Payer: Self-pay | Source: Ambulatory Visit | Attending: Cardiology | Admitting: Cardiology

## 2016-04-17 ENCOUNTER — Encounter (HOSPITAL_COMMUNITY)
Admission: RE | Admit: 2016-04-17 | Discharge: 2016-04-17 | Disposition: A | Discharge status: Home / Self Care | Payer: Self-pay | Source: Ambulatory Visit | Attending: Cardiology | Admitting: Cardiology

## 2016-04-18 ENCOUNTER — Other Ambulatory Visit: Payer: Self-pay | Admitting: Cardiology

## 2016-04-19 ENCOUNTER — Encounter (HOSPITAL_COMMUNITY)
Admission: RE | Admit: 2016-04-19 | Discharge: 2016-04-19 | Disposition: A | Discharge status: Home / Self Care | Payer: Self-pay | Source: Ambulatory Visit | Attending: Cardiology | Admitting: Cardiology

## 2016-04-24 ENCOUNTER — Ambulatory Visit (INDEPENDENT_AMBULATORY_CARE_PROVIDER_SITE_OTHER): Payer: Medicare Other | Admitting: *Deleted

## 2016-04-24 ENCOUNTER — Encounter (HOSPITAL_COMMUNITY)
Admission: RE | Admit: 2016-04-24 | Discharge: 2016-04-24 | Disposition: A | Discharge status: Home / Self Care | Payer: Self-pay | Source: Ambulatory Visit | Attending: Cardiology | Admitting: Cardiology

## 2016-04-24 DIAGNOSIS — I480 Paroxysmal atrial fibrillation: Secondary | ICD-10-CM | POA: Diagnosis not present

## 2016-04-24 DIAGNOSIS — I4891 Unspecified atrial fibrillation: Secondary | ICD-10-CM

## 2016-04-24 DIAGNOSIS — Z5181 Encounter for therapeutic drug level monitoring: Secondary | ICD-10-CM | POA: Diagnosis not present

## 2016-04-24 LAB — POCT INR: INR: 2.6

## 2016-04-26 ENCOUNTER — Encounter (HOSPITAL_COMMUNITY)
Admission: RE | Admit: 2016-04-26 | Discharge: 2016-04-26 | Disposition: A | Discharge status: Home / Self Care | Payer: Self-pay | Source: Ambulatory Visit | Attending: Cardiology | Admitting: Cardiology

## 2016-04-26 IMAGING — CR DG CHEST 2V
2 series · 2 of 2 positions shown · non-contrast
Comparison: Portable chest x-ray of 12/03/2013

CLINICAL DATA: Post CABG, shortness of breath and chest soreness

EXAM:
CHEST  2 VIEW

[w chest pa]
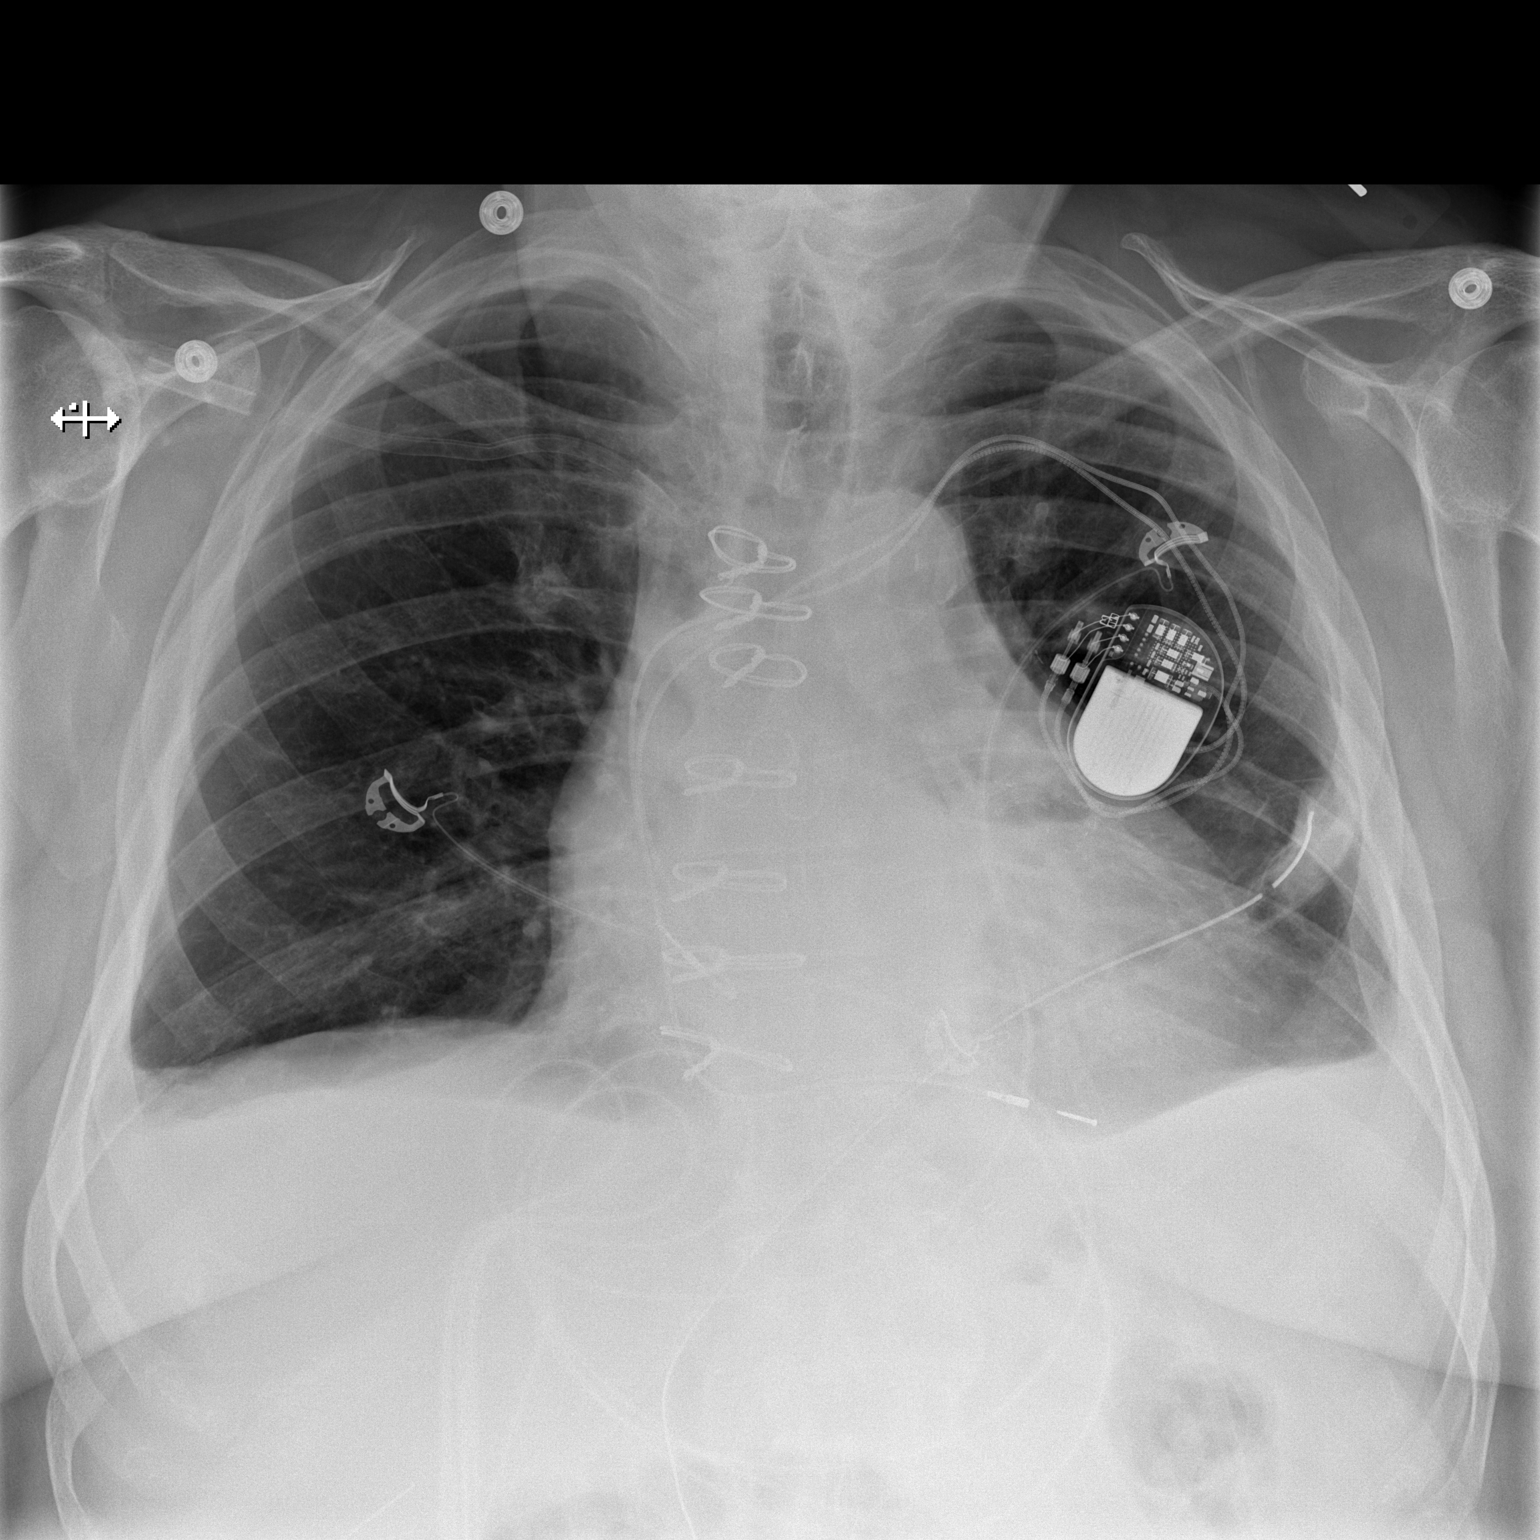

[w chest lat]
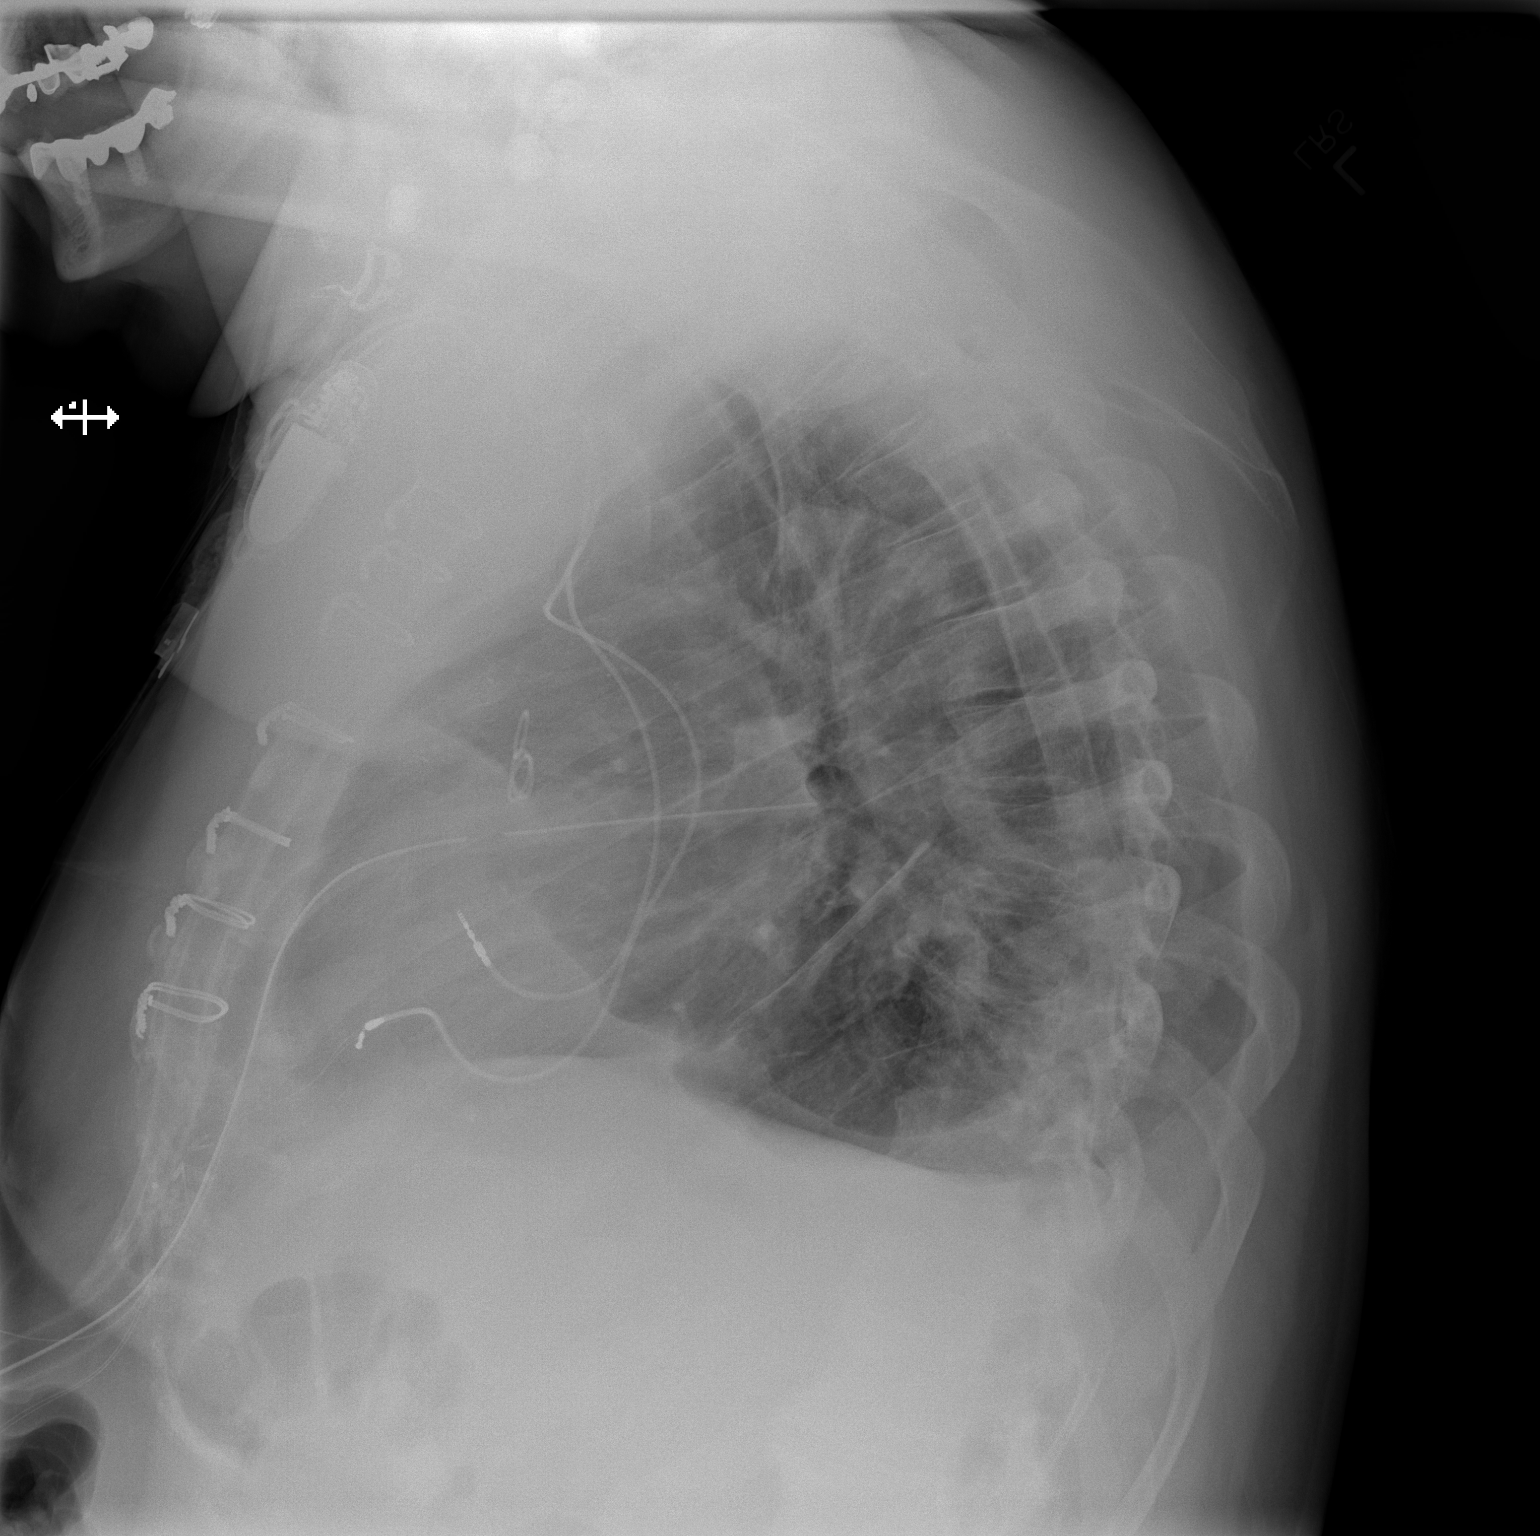

[2 of 2 positions shown; findings below may reference images not displayed]

FINDINGS: The left chest tube remains and no definite pneumothorax is seen.
Small bilateral pleural effusions remain with mild basilar
atelectasis. Cardiomegaly is stable. Dual lead permanent pacemaker
is unchanged.
IMPRESSION: Slightly better aeration with small pleural effusions and mild
basilar atelectasis remaining. No definite pneumothorax.

## 2016-04-28 ENCOUNTER — Encounter (HOSPITAL_COMMUNITY)
Admission: RE | Admit: 2016-04-28 | Discharge: 2016-04-28 | Disposition: A | Discharge status: Home / Self Care | Payer: Self-pay | Source: Ambulatory Visit | Attending: Cardiology | Admitting: Cardiology

## 2016-04-28 DIAGNOSIS — Z951 Presence of aortocoronary bypass graft: Secondary | ICD-10-CM | POA: Insufficient documentation

## 2016-05-01 ENCOUNTER — Encounter (HOSPITAL_COMMUNITY)
Admission: RE | Admit: 2016-05-01 | Discharge: 2016-05-01 | Disposition: A | Discharge status: Home / Self Care | Payer: Self-pay | Source: Ambulatory Visit | Attending: Cardiology | Admitting: Cardiology

## 2016-05-03 ENCOUNTER — Encounter (HOSPITAL_COMMUNITY)
Admission: RE | Admit: 2016-05-03 | Discharge: 2016-05-03 | Disposition: A | Discharge status: Home / Self Care | Payer: Self-pay | Source: Ambulatory Visit | Attending: Cardiology | Admitting: Cardiology

## 2016-05-05 ENCOUNTER — Encounter (HOSPITAL_COMMUNITY)
Admission: RE | Admit: 2016-05-05 | Discharge: 2016-05-05 | Disposition: A | Discharge status: Home / Self Care | Payer: Self-pay | Source: Ambulatory Visit | Attending: Cardiology | Admitting: Cardiology

## 2016-05-08 ENCOUNTER — Encounter (HOSPITAL_COMMUNITY)
Admission: RE | Admit: 2016-05-08 | Discharge: 2016-05-08 | Disposition: A | Discharge status: Home / Self Care | Payer: Self-pay | Source: Ambulatory Visit | Attending: Cardiology | Admitting: Cardiology

## 2016-05-10 ENCOUNTER — Encounter (HOSPITAL_COMMUNITY)
Admission: RE | Admit: 2016-05-10 | Discharge: 2016-05-10 | Disposition: A | Discharge status: Home / Self Care | Payer: Self-pay | Source: Ambulatory Visit | Attending: Cardiology | Admitting: Cardiology

## 2016-05-12 ENCOUNTER — Encounter (HOSPITAL_COMMUNITY): Payer: Self-pay

## 2016-05-15 ENCOUNTER — Encounter (HOSPITAL_COMMUNITY): Payer: Self-pay

## 2016-05-17 ENCOUNTER — Encounter: Payer: Self-pay | Admitting: *Deleted

## 2016-05-17 ENCOUNTER — Encounter (HOSPITAL_COMMUNITY): Payer: Self-pay

## 2016-05-19 ENCOUNTER — Encounter (HOSPITAL_COMMUNITY): Payer: Self-pay

## 2016-05-23 ENCOUNTER — Encounter: Payer: Self-pay | Admitting: *Deleted

## 2016-05-23 ENCOUNTER — Other Ambulatory Visit: Payer: Self-pay | Admitting: Cardiology

## 2016-05-24 ENCOUNTER — Ambulatory Visit (INDEPENDENT_AMBULATORY_CARE_PROVIDER_SITE_OTHER): Payer: Medicare Other | Admitting: *Deleted

## 2016-05-24 ENCOUNTER — Encounter (HOSPITAL_COMMUNITY)
Admission: RE | Admit: 2016-05-24 | Discharge: 2016-05-24 | Disposition: A | Discharge status: Home / Self Care | Payer: Self-pay | Source: Ambulatory Visit | Attending: Cardiology | Admitting: Cardiology

## 2016-05-24 DIAGNOSIS — Z5181 Encounter for therapeutic drug level monitoring: Secondary | ICD-10-CM

## 2016-05-24 DIAGNOSIS — I4891 Unspecified atrial fibrillation: Secondary | ICD-10-CM

## 2016-05-24 DIAGNOSIS — I480 Paroxysmal atrial fibrillation: Secondary | ICD-10-CM | POA: Diagnosis not present

## 2016-05-24 LAB — POCT INR: INR: 2

## 2016-05-26 ENCOUNTER — Encounter (HOSPITAL_COMMUNITY)
Admission: RE | Admit: 2016-05-26 | Discharge: 2016-05-26 | Disposition: A | Discharge status: Home / Self Care | Payer: Self-pay | Source: Ambulatory Visit | Attending: Cardiology | Admitting: Cardiology

## 2016-05-31 ENCOUNTER — Encounter (HOSPITAL_COMMUNITY)
Admission: RE | Admit: 2016-05-31 | Discharge: 2016-05-31 | Disposition: A | Discharge status: Home / Self Care | Payer: Self-pay | Source: Ambulatory Visit | Attending: Cardiology | Admitting: Cardiology

## 2016-05-31 DIAGNOSIS — Z951 Presence of aortocoronary bypass graft: Secondary | ICD-10-CM | POA: Insufficient documentation

## 2016-06-02 ENCOUNTER — Encounter (HOSPITAL_COMMUNITY)
Admission: RE | Admit: 2016-06-02 | Discharge: 2016-06-02 | Disposition: A | Discharge status: Home / Self Care | Payer: Self-pay | Source: Ambulatory Visit | Attending: Cardiology | Admitting: Cardiology

## 2016-06-05 ENCOUNTER — Encounter (HOSPITAL_COMMUNITY): Payer: Self-pay

## 2016-06-07 ENCOUNTER — Ambulatory Visit (INDEPENDENT_AMBULATORY_CARE_PROVIDER_SITE_OTHER): Payer: Medicare Other | Admitting: Cardiology

## 2016-06-07 ENCOUNTER — Encounter (HOSPITAL_COMMUNITY)
Admission: RE | Admit: 2016-06-07 | Discharge: 2016-06-07 | Disposition: A | Discharge status: Home / Self Care | Payer: Self-pay | Source: Ambulatory Visit | Attending: Cardiology | Admitting: Cardiology

## 2016-06-07 ENCOUNTER — Encounter: Payer: Self-pay | Admitting: Cardiology

## 2016-06-07 ENCOUNTER — Encounter (INDEPENDENT_AMBULATORY_CARE_PROVIDER_SITE_OTHER): Payer: Self-pay

## 2016-06-07 VITALS — BP 128/84 | HR 86 | Ht 67.0 in | Wt 244.8 lb

## 2016-06-07 DIAGNOSIS — I350 Nonrheumatic aortic (valve) stenosis: Secondary | ICD-10-CM

## 2016-06-07 DIAGNOSIS — E7800 Pure hypercholesterolemia, unspecified: Secondary | ICD-10-CM

## 2016-06-07 DIAGNOSIS — I480 Paroxysmal atrial fibrillation: Secondary | ICD-10-CM | POA: Diagnosis not present

## 2016-06-07 DIAGNOSIS — I6529 Occlusion and stenosis of unspecified carotid artery: Secondary | ICD-10-CM

## 2016-06-07 DIAGNOSIS — I2510 Atherosclerotic heart disease of native coronary artery without angina pectoris: Secondary | ICD-10-CM | POA: Diagnosis not present

## 2016-06-07 DIAGNOSIS — I5042 Chronic combined systolic (congestive) and diastolic (congestive) heart failure: Secondary | ICD-10-CM

## 2016-06-07 DIAGNOSIS — I10 Essential (primary) hypertension: Secondary | ICD-10-CM

## 2016-06-07 DIAGNOSIS — Z9861 Coronary angioplasty status: Secondary | ICD-10-CM

## 2016-06-07 NOTE — Progress Notes (Signed)
Cardiology Office Note    Date:  06/08/2016   ID:  Brandon Klein, DOB July 16, 1940, MRN [id number]  PCP:  Gaye Alken, MD  Cardiologist:  Armanda Magic, MD   Chief Complaint  Patient presents with  . Coronary Artery Disease  . Hypertension  . Hyperlipidemia    History of Present Illness:  Brandon Klein is a 76 y.o. male  with a hx of CAD, status post prior stenting to the LAD and balloon angioplasty to the circumflex, cath 10/2013 with severe 3 vessel ASCAD s/p CABG (LIMA-LAD, SVG to diag,SVG to circ, SVG to PDA) with normal LVF at time of cath, paroxysmal atrial fibrillation, bradycardia s/p PPM, moderate to severe aortic stenosis by recent dobutamine echo, HTN, HL. He developed acute CHF about 6 months after CABG and was found to have severe LV dysfunction. Cath 07/07/2014 showed occluded SVG to PDA, all other grafts patent. EF down felt secondary to NICM but there has been some concern regarding possible worsening AS. There was some concern that his AS was underestimated due to LV dysfunction and he underwent dobutamine echo. The aortic valve peak velocity increased from 2.73m/sec to 3.29 m/sec at peak dobutamine infusion. The mean AV gradient increased from to 37.32mmHg at peak dobutamine infusion. The AVA was calculated anywhere from 1.4cm2 at baselineto 1.15cm2 at mid dose and 1.4cm2 at high dose. All data consistent with at least moderate aortic stenosis. He underwent TEE to get a planimetered valve area which was1.14-1.4cm2 c/w moderate AS. EF at that time was 20-25%. He was referred to Dr. Johney Frame for consideration of prophylatic AICD implantation. 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 He does not meet criteria for CRT due to narrow QRS on EKG. He decided not to pursue ICD at that time and wanted to wait until his appt with Dr. Graciela Husbands in August.  Repeat echo at that time showed EF 35-40% and ICD was not indicated.   He presents back today and is doing quite well. He is participating in cardiac rehab and loves it. He denies any chest pain, SOB, DOE, LE edema, dizziness, palpitations or syncope.     Past Medical History:  Diagnosis Date  . AS (aortic stenosis)    mild by echo 10/2013  . Carotid artery plaque    a. Duplex 10/2013: mild calcific plaque origin ICA. Left: intimal wall thickening CCA. 0-39% BICA.  Marland Kitchen Chronic renal insufficiency   . Chronic systolic dysfunction of left ventricle   . Coronary artery disease    a. s/p PCI of LAD 1997. b. Cutting balloon angioplasty to LCx in 2002. c. s/p cath 11/2013 with severe 3 vessel ASCAD s/p CABG with LIMA to LAD, SVG to diag, SVG to left circ and SVG to PDA. d. cath 07/07/2014 occluded SVG to PDA, all other grafts patent. 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 DJD (degenerative joint disease)   . Epistaxis   . GERD (gastroesophageal reflux disease)   . GI bleeding    a.  with benign gastric polypectomy 05/2014.  Marland Kitchen HLD (hyperlipidemia)   . HTN (hypertension)   . Hypokalemia   . Hypomagnesemia   . Impaired fasting glucose    A1C check every year  . Obesity   . OSA (obstructive sleep apnea)   . Pacemaker    a. 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 b. MDT  generator replacement 2012.  Marland Kitchen PAF (paroxysmal atrial fibrillation) (HCC)    on chronic systemic anticoagulation  . PVC's (premature ventricular contractions)   . Syncope    a. vasovagal with documented bradycardia.  . Type 2 diabetes mellitus without complications (HCC) 12/02/2015    Past Surgical History:  Procedure Laterality Date  . ANGIOPLASTY  1997  . CHOLECYSTECTOMY    . COLONOSCOPY WITH PROPOFOL N/A 05/05/2014   Procedure: COLONOSCOPY WITH PROPOFOL;  Surgeon: Charolett Bumpers, MD;  Location: [facility] ENDOSCOPY;  Service: Endoscopy;  Laterality: N/A;  . CORONARY ARTERY BYPASS GRAFT N/A 12/01/2013   Procedure:  CORONARY ARTERY BYPASS GRAFT TIMES FOUR USING LEFT INTERNAL MAMMARY ARTERY TO LAD, SAPHENOUS VEIN GRAFTS TO DIAGONAL, CIRCUMFELX AND POSTERIOR DESCENDING;  Surgeon: Kerin Perna, MD;  Location: [facility] OR;  Service: Open Heart Surgery;  Laterality: N/A;  . ESOPHAGOGASTRODUODENOSCOPY (EGD) WITH PROPOFOL N/A 05/05/2014   Procedure: ESOPHAGOGASTRODUODENOSCOPY (EGD) WITH PROPOFOL;  Surgeon: Charolett Bumpers, MD;  Location: [facility] ENDOSCOPY;  Service: Endoscopy;  Laterality: N/A;  . KNEE ARTHROSCOPY  2000   Left  . LEFT AND RIGHT HEART CATHETERIZATION WITH CORONARY/GRAFT ANGIOGRAM N/A 07/07/2014   Procedure: LEFT AND RIGHT HEART CATHETERIZATION WITH Isabel Caprice;  Surgeon: Kathleene Hazel, MD;  Location: [facility] CATH LAB;  Service: Cardiovascular;  Laterality: N/A;  . LEFT HEART CATHETERIZATION WITH CORONARY ANGIOGRAM N/A 11/25/2013   Procedure: LEFT HEART CATHETERIZATION WITH CORONARY ANGIOGRAM;  Surgeon: [name], MD;  Location: [facility] CATH LAB;  Service: Cardiovascular;  Laterality: N/A;  . PACEMAKER INSERTION  2002   generator change MDT by Dr Graciela Husbands in 2012  . TEE WITHOUT CARDIOVERSION N/A 08/31/2014   Procedure: TRANSESOPHAGEAL ECHOCARDIOGRAM (TEE);  Surgeon: [name], MD;  Location: [facility] ENDOSCOPY;  Service: Cardiovascular;  Laterality: N/A;    Current Medications: Outpatient Medications Prior to Visit  Medication Sig Dispense Refill  . aspirin EC 81 MG EC tablet Take 1 tablet (81 mg total) by mouth daily. (Patient taking differently: Take 81 mg by mouth every morning. )    . atorvastatin (LIPITOR) 20 MG tablet Take 20 mg by mouth daily at 6 PM.     . carvedilol (COREG) 25 MG tablet Take 0.5 tablets (12.5 mg total) by mouth 2 (two) times daily with a meal. 60 tablet 6  . Cholecalciferol (VITAMIN D) 2000 UNITS tablet Take 2,000 Units by mouth daily.    . Cyanocobalamin (VITAMIN B-12 PO) Take 1 tablet by mouth every morning.    . docusate sodium (COLACE) 100 MG capsule Take 100 mg by  mouth 2 (two) times daily as needed for mild constipation.    . fexofenadine (ALLEGRA) 180 MG tablet Take 180 mg by mouth every morning.     . fluticasone (FLONASE) 50 MCG/ACT nasal spray Place 2 sprays into the nose every morning.     . furosemide (LASIX) 20 MG tablet TAKE 1 TABLET BY MOUTH TWICE A DAY TAKE WITH 80 MG TO MAKE TOTAL 100 MG TOTAL 180 tablet 3  . furosemide (LASIX) 80 MG tablet TAKE 1 TABLET BY MOUTH TWICE DAILY WITH 20 MG TO MAKE A TOTAL OF 100 MG 180 tablet 3  . Glucosamine-Chondroit-Vit C-Mn (GLUCOSAMINE CHONDR 1500 COMPLX) CAPS Take 1 capsule by mouth every morning.     . hydrALAZINE (APRESOLINE) 25 MG tablet Take 1 tablet (25 mg total) by mouth 3 (three) times daily. 90 tablet 11  . isosorbide mononitrate (IMDUR) 30 MG 24 hr tablet TAKE 1 TABLET BY MOUTH ONCE  DAILY 30 tablet 11  . L-Lysine 500 MG CAPS Take 1 capsule by mouth every morning.     . meclizine (ANTIVERT) 25 MG tablet Take 25 mg by mouth daily as needed for dizziness.     . Multiple Vitamin (MULTIVITAMIN) tablet Take 1 tablet by mouth every morning.     . nitroGLYCERIN (NITROSTAT) 0.4 MG SL tablet Place 1 tablet (0.4 mg total) under the tongue every 5 (five) minutes as needed for chest pain. (Patient taking differently: Place 0.4 mg under the tongue every 5 (five) minutes as needed for chest pain (MAX 3 TABLETS). ) 30 tablet 3  . ONE TOUCH ULTRA TEST test strip 1 each by Other route every morning.     Letta Pate DELICA LANCETS FINE MISC 1 each by Other route every morning.     . pantoprazole (PROTONIX) 40 MG tablet Take 40 mg by mouth every morning.     . potassium chloride SA (K-DUR,KLOR-CON) 20 MEQ tablet TAKE 2 TABLETS BY MOUTH TWICE DAILY 360 tablet 3  . RA NATURAL MAGNESIUM 250 MG TABS TAKE 2 TABLETS TWICE DAILY. 120 tablet 5  . spironolactone (ALDACTONE) 25 MG tablet Take 1 tablet (25 mg total) by mouth daily. 90 tablet 3  . warfarin (COUMADIN) 5 MG tablet TAKE AS DIRECTED BY COUMADIN CLINIC 120 tablet 0  .  Magnesium Oxide -Mg Supplement 250 MG TABS TAKE 2 TABLETS TWICE DAILY 120 tablet 3   No facility-administered medications prior to visit.      Allergies:   Acetaminophen-codeine; Amoxicillin; and Diovan [valsartan]   Social History   Social History  . Marital status: Married    Spouse name: N/A  . Number of children: N/A  . Years of education: N/A   Social History Main Topics  . Smoking status: Former Smoker    Packs/day: 2.00    Years: 5.00    Types: Cigarettes    Quit date: 05/29/1966  . Smokeless tobacco: Never Used  . Alcohol use 0.0 oz/week     Comment: ocassional  . Drug use: No  . Sexual activity: Not Asked   Other Topics Concern  . None   Social History Narrative   Lives in [place] with spouse.  Retired Medical illustrator     Family History:  The patient's family history includes Colon cancer in his mother; Heart attack in his brother and mother; Leukemia in his father.   ROS:   Please see the history of present illness.    ROS All other systems reviewed and are negative.  No flowsheet data found.     PHYSICAL EXAM:   VS:  BP 128/84   Pulse 86   Ht 5\' 7"  (1.702 m)   Wt 244 lb 12.8 oz (111 kg)   BMI 38.34 kg/m    GEN: Well nourished, well developed, in no acute distress  HEENT: normal  Neck: no JVD, carotid bruits, or masses Cardiac: RRR; no rubs, or gallops,no edema.  Intact distal pulses bilaterally. 2/6 SM at RUSB mid peaking Respiratory:  clear to auscultation bilaterally, normal work of breathing GI: soft, nontender, nondistended, + BS MS: no deformity or atrophy  Skin: warm and dry, no rash Neuro:  Alert and Oriented x 3, Strength and sensation are intact Psych: euthymic mood, full affect  Wt Readings from Last 3 Encounters:  06/07/16 244 lb 12.8 oz (111 kg)  03/13/16 241 lb 12.8 oz (109.7 kg)  12/02/15 239 lb (108.4 kg)      Studies/Labs Reviewed:  EKG:  EKG is not ordered today.  Recent Labs: No results found for requested labs within  last 8760 hours.   Lipid Panel    Component Value Date/Time   CHOL 154 06/04/2015 1216   CHOL 113 07/22/2014 1226   TRIG 196 (H) 06/04/2015 1216   TRIG 91 07/22/2014 1226   HDL 45 06/04/2015 1216   HDL 40 07/22/2014 1226   CHOLHDL 3.4 06/04/2015 1216   VLDL 39 (H) 06/04/2015 1216   LDLCALC 70 06/04/2015 1216   LDLCALC 55 07/22/2014 1226   LDLDIRECT 47.9 07/14/2013 0937    Additional studies/ records that were reviewed today include:  none    ASSESSMENT:    1. Recurrent coronary arteriosclerosis after percutaneous transluminal coronary angioplasty   2. PAF (paroxysmal atrial fibrillation) (HCC)   3. Aortic valve stenosis, etiology of cardiac valve disease unspecified   4. Carotid atherosclerosis, unspecified laterality   5. Chronic combined systolic and diastolic CHF (congestive heart failure) (HCC)   6. Essential hypertension   7. Pure hypercholesterolemia      PLAN:  In order of problems listed above:  1. ASCAD -  status post prior stenting to the LAD and balloon angioplasty to the circumflex.  Cath 10/2013 with severe 3 vessel ASCAD s/p CABG (LIMA-LAD, SVG to diag,SVG to circ, SVG to PDA).  He has no anginal symptoms.  He continues in the maintenance program at cardiac rehab.  Continue ASA/statin/BB/long acting nitrates. 2. Persistent atrial fibrillation maintaining NSR. He will continue on BB and warfarin.  3. Moderate aortic stesnosis - he is asymptomatic. Repeat echo 01/2017. 4. Carotid artery stenosis 1-39% bilateral on dopplers 2015.  Repeat dopplers to assess for progression.  Continue ASA and statin.  5. Combined systolic/diastolic CHF - he appears euvolemic on exam.  His weight is stable.  Continue BB/Hydralazine/Imdur/aldactone and diuretic.   No ACE I/ARB due to CKD.  Will check BMET from PCP. 6. HTN - BP controlled on current meds.  Continue BB and hydralazine. 7.   Hyperlipidemia - LDL goal < 70.  Continue statin. Check FLP and ALT from PCP.   Medication  Adjustments/Labs and Tests Ordered: Current medicines are reviewed at length with the patient today.  Concerns regarding medicines are outlined above.  Medication changes, Labs and Tests ordered today are listed in the Patient Instructions below.  Patient Instructions  Medication Instructions:  Your physician recommends that you continue on your current medications as directed. Please refer to the Current Medication list given to you today.   Labwork: None  Testing/Procedures: Your physician has requested that you have an echocardiogram in September, 2018. Echocardiography is a painless test that uses sound waves to create images of your heart. It provides your doctor with information about the size and shape of your heart and how well your heart's chambers and valves are working. This procedure takes approximately one hour. There are no restrictions for this procedure.  Follow-Up: Your physician wants you to follow-up in: 6 months with Dr. [name]. You will receive a reminder letter in the mail two months in advance. If you don't receive a letter, please call our office to schedule the follow-up appointment.   Any Other Special Instructions Will Be Listed Below (If Applicable).     If you need a refill on your cardiac medications before your next appointment, please call your pharmacy.      Signed, Armanda Magic, MD  06/08/2016 12:43 PM    [facility] [address]  [address] [place], [place]  [address] Phone: [phone]; Fax: [phone]

## 2016-06-07 NOTE — Patient Instructions (Signed)
Medication Instructions:  Your physician recommends that you continue on your current medications as directed. Please refer to the Current Medication list given to you today.   Labwork: None  Testing/Procedures: Your physician has requested that you have an echocardiogram in September, 2018. Echocardiography is a painless test that uses sound waves to create images of your heart. It provides your doctor with information about the size and shape of your heart and how well your heart's chambers and valves are working. This procedure takes approximately one hour. There are no restrictions for this procedure.  Follow-Up: Your physician wants you to follow-up in: 6 months with Dr. [name]. You will receive a reminder letter in the mail two months in advance. If you don't receive a letter, please call our office to schedule the follow-up appointment.   Any Other Special Instructions Will Be Listed Below (If Applicable).     If you need a refill on your cardiac medications before your next appointment, please call your pharmacy.

## 2016-06-09 ENCOUNTER — Encounter (HOSPITAL_COMMUNITY)
Admission: RE | Admit: 2016-06-09 | Discharge: 2016-06-09 | Disposition: A | Discharge status: Home / Self Care | Payer: Self-pay | Source: Ambulatory Visit | Attending: Cardiology | Admitting: Cardiology

## 2016-06-12 ENCOUNTER — Ambulatory Visit (INDEPENDENT_AMBULATORY_CARE_PROVIDER_SITE_OTHER): Payer: Medicare Other | Admitting: *Deleted

## 2016-06-12 ENCOUNTER — Encounter (HOSPITAL_COMMUNITY)
Admission: RE | Admit: 2016-06-12 | Discharge: 2016-06-12 | Disposition: A | Discharge status: Home / Self Care | Payer: Self-pay | Source: Ambulatory Visit | Attending: Cardiology | Admitting: Cardiology

## 2016-06-12 DIAGNOSIS — I495 Sick sinus syndrome: Secondary | ICD-10-CM | POA: Diagnosis not present

## 2016-06-12 NOTE — Progress Notes (Signed)
Remote pacemaker transmission.   

## 2016-06-13 ENCOUNTER — Telehealth: Payer: Self-pay

## 2016-06-13 DIAGNOSIS — I6529 Occlusion and stenosis of unspecified carotid artery: Secondary | ICD-10-CM

## 2016-06-13 NOTE — Telephone Encounter (Signed)
Per Dr. [name], ordered carotid dopplers for carotid stenosis. Patient agrees with treatment plan.

## 2016-06-14 ENCOUNTER — Encounter (HOSPITAL_COMMUNITY): Payer: Self-pay

## 2016-06-16 ENCOUNTER — Encounter (HOSPITAL_COMMUNITY): Payer: Self-pay

## 2016-06-16 LAB — CUP PACEART REMOTE DEVICE CHECK
Battery Impedance: 280 Ohm
Brady Statistic AP VP Percent: 0 %
Brady Statistic AS VS Percent: 58 %
Date Time Interrogation Session: 20180115151320
Implantable Lead Implant Date: 20020926
Implantable Lead Location: 753860
Implantable Lead Model: 5076
Implantable Lead Model: 5092
Implantable Pulse Generator Implant Date: 20120905
Lead Channel Pacing Threshold Amplitude: 0.5 V
Lead Channel Sensing Intrinsic Amplitude: 1 mV
Lead Channel Sensing Intrinsic Amplitude: 16 mV
Lead Channel Setting Pacing Pulse Width: 0.4 ms
MDC IDC LEAD IMPLANT DT: 20020926
MDC IDC LEAD LOCATION: 753859
MDC IDC MSMT BATTERY REMAINING LONGEVITY: 118 mo
MDC IDC MSMT BATTERY VOLTAGE: 2.78 V
MDC IDC MSMT LEADCHNL RA IMPEDANCE VALUE: 518 Ohm
MDC IDC MSMT LEADCHNL RA PACING THRESHOLD PULSEWIDTH: 0.4 ms
MDC IDC MSMT LEADCHNL RV IMPEDANCE VALUE: 955 Ohm
MDC IDC MSMT LEADCHNL RV PACING THRESHOLD AMPLITUDE: 1 V
MDC IDC MSMT LEADCHNL RV PACING THRESHOLD PULSEWIDTH: 0.4 ms
MDC IDC SET LEADCHNL RA PACING AMPLITUDE: 2 V
MDC IDC SET LEADCHNL RV PACING AMPLITUDE: 2.5 V
MDC IDC SET LEADCHNL RV SENSING SENSITIVITY: 5.6 mV
MDC IDC STAT BRADY AP VS PERCENT: 41 %
MDC IDC STAT BRADY AS VP PERCENT: 1 %

## 2016-06-19 ENCOUNTER — Encounter (HOSPITAL_COMMUNITY)
Admission: RE | Admit: 2016-06-19 | Discharge: 2016-06-19 | Disposition: A | Discharge status: Home / Self Care | Payer: Self-pay | Source: Ambulatory Visit | Attending: Cardiology | Admitting: Cardiology

## 2016-06-20 ENCOUNTER — Other Ambulatory Visit: Payer: Self-pay | Admitting: Cardiology

## 2016-06-21 ENCOUNTER — Encounter (HOSPITAL_COMMUNITY)
Admission: RE | Admit: 2016-06-21 | Discharge: 2016-06-21 | Disposition: A | Discharge status: Home / Self Care | Payer: Self-pay | Source: Ambulatory Visit | Attending: Cardiology | Admitting: Cardiology

## 2016-06-21 ENCOUNTER — Ambulatory Visit (INDEPENDENT_AMBULATORY_CARE_PROVIDER_SITE_OTHER): Payer: Medicare Other | Admitting: *Deleted

## 2016-06-21 DIAGNOSIS — I4891 Unspecified atrial fibrillation: Secondary | ICD-10-CM | POA: Diagnosis not present

## 2016-06-21 DIAGNOSIS — Z5181 Encounter for therapeutic drug level monitoring: Secondary | ICD-10-CM | POA: Diagnosis not present

## 2016-06-21 DIAGNOSIS — I480 Paroxysmal atrial fibrillation: Secondary | ICD-10-CM | POA: Diagnosis not present

## 2016-06-21 LAB — POCT INR: INR: 2.8

## 2016-06-23 ENCOUNTER — Encounter (HOSPITAL_COMMUNITY)
Admission: RE | Admit: 2016-06-23 | Discharge: 2016-06-23 | Disposition: A | Discharge status: Home / Self Care | Payer: Self-pay | Source: Ambulatory Visit | Attending: Cardiology | Admitting: Cardiology

## 2016-06-26 ENCOUNTER — Encounter (HOSPITAL_COMMUNITY)
Admission: RE | Admit: 2016-06-26 | Discharge: 2016-06-26 | Disposition: A | Discharge status: Home / Self Care | Payer: Self-pay | Source: Ambulatory Visit | Attending: Cardiology | Admitting: Cardiology

## 2016-06-27 ENCOUNTER — Ambulatory Visit (HOSPITAL_COMMUNITY)
Admission: RE | Admit: 2016-06-27 | Discharge: 2016-06-27 | Disposition: A | Discharge status: Home / Self Care | Payer: Medicare Other | Source: Ambulatory Visit | Attending: Cardiology | Admitting: Cardiology

## 2016-06-27 DIAGNOSIS — E119 Type 2 diabetes mellitus without complications: Secondary | ICD-10-CM | POA: Diagnosis not present

## 2016-06-27 DIAGNOSIS — I6529 Occlusion and stenosis of unspecified carotid artery: Secondary | ICD-10-CM

## 2016-06-27 DIAGNOSIS — I2510 Atherosclerotic heart disease of native coronary artery without angina pectoris: Secondary | ICD-10-CM | POA: Insufficient documentation

## 2016-06-27 DIAGNOSIS — Z87891 Personal history of nicotine dependence: Secondary | ICD-10-CM | POA: Insufficient documentation

## 2016-06-27 DIAGNOSIS — I6523 Occlusion and stenosis of bilateral carotid arteries: Secondary | ICD-10-CM

## 2016-06-27 DIAGNOSIS — I10 Essential (primary) hypertension: Secondary | ICD-10-CM | POA: Diagnosis not present

## 2016-06-27 DIAGNOSIS — Z951 Presence of aortocoronary bypass graft: Secondary | ICD-10-CM | POA: Insufficient documentation

## 2016-06-27 DIAGNOSIS — E785 Hyperlipidemia, unspecified: Secondary | ICD-10-CM | POA: Insufficient documentation

## 2016-06-27 IMAGING — CR DG CHEST 2V
2 series · 2 of 2 positions shown · non-contrast
Comparison: PA and lateral chest 01/07/2014.

CLINICAL DATA: Status post CABG.

EXAM:
CHEST  2 VIEW

[view not recorded (1 of 2)]
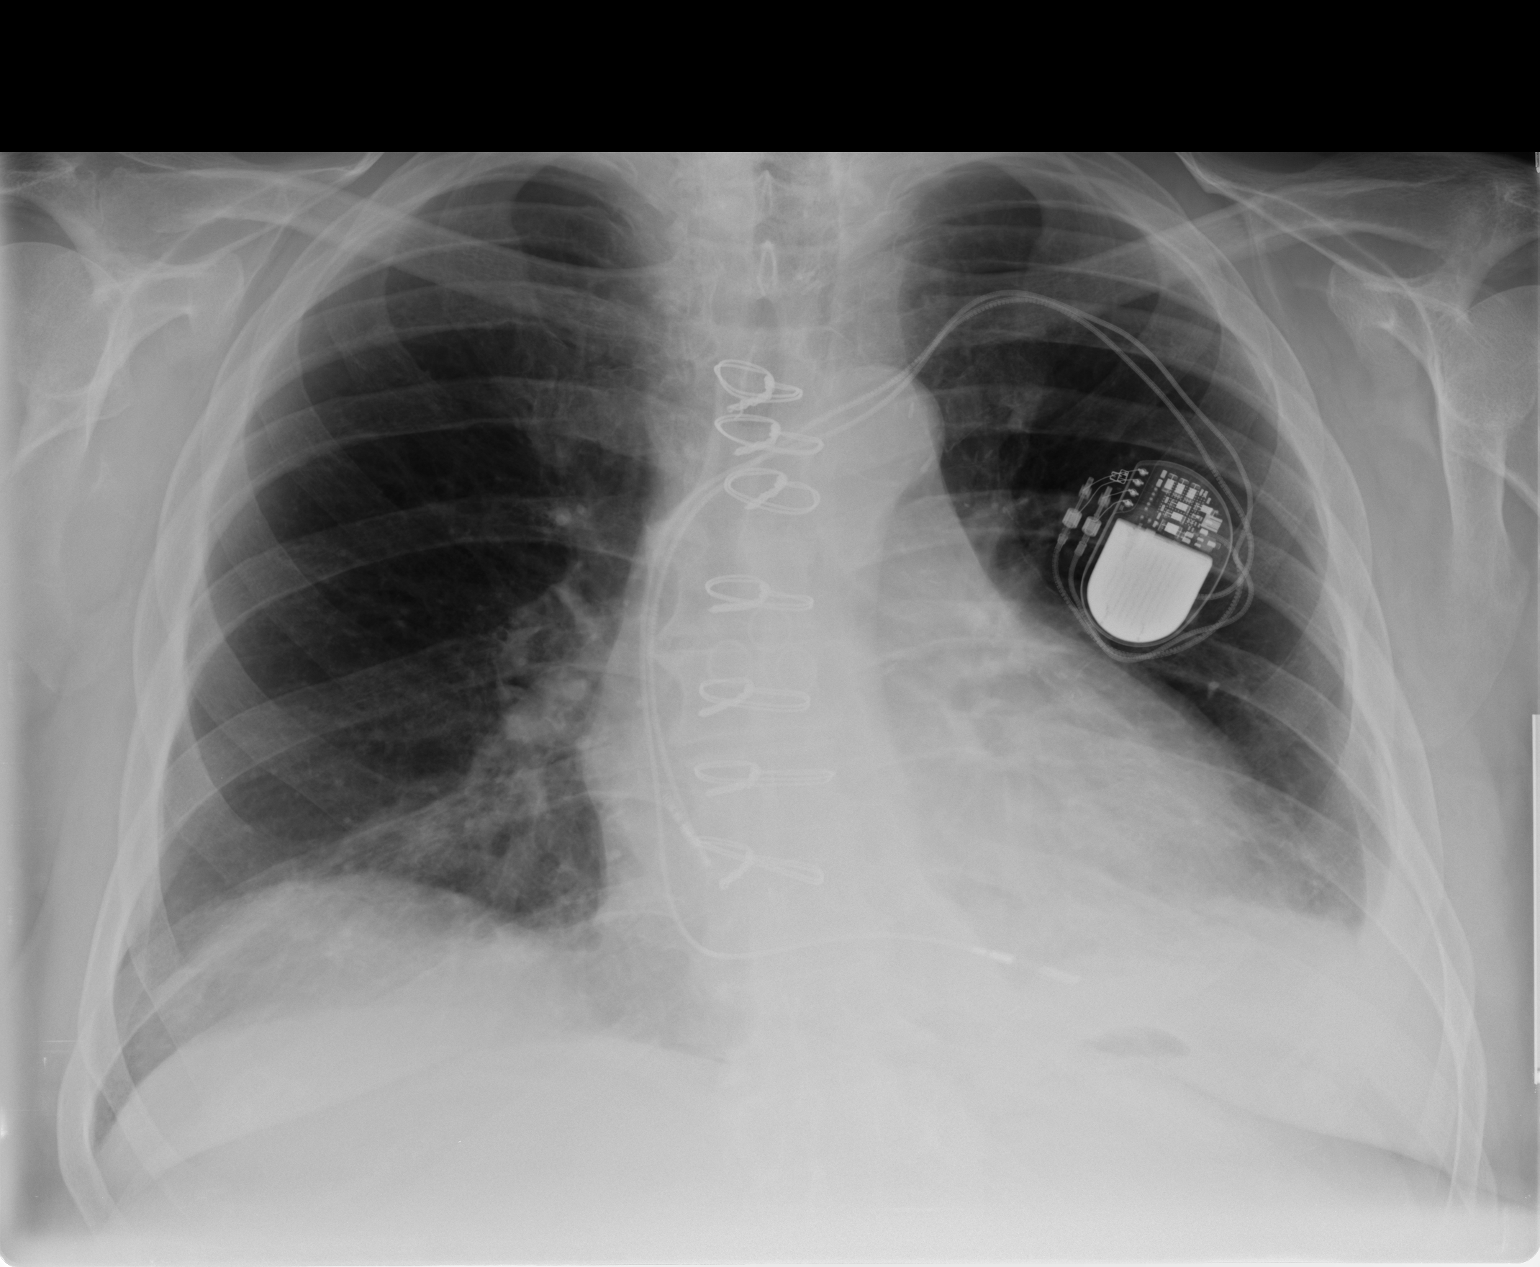

[view not recorded (2 of 2)]
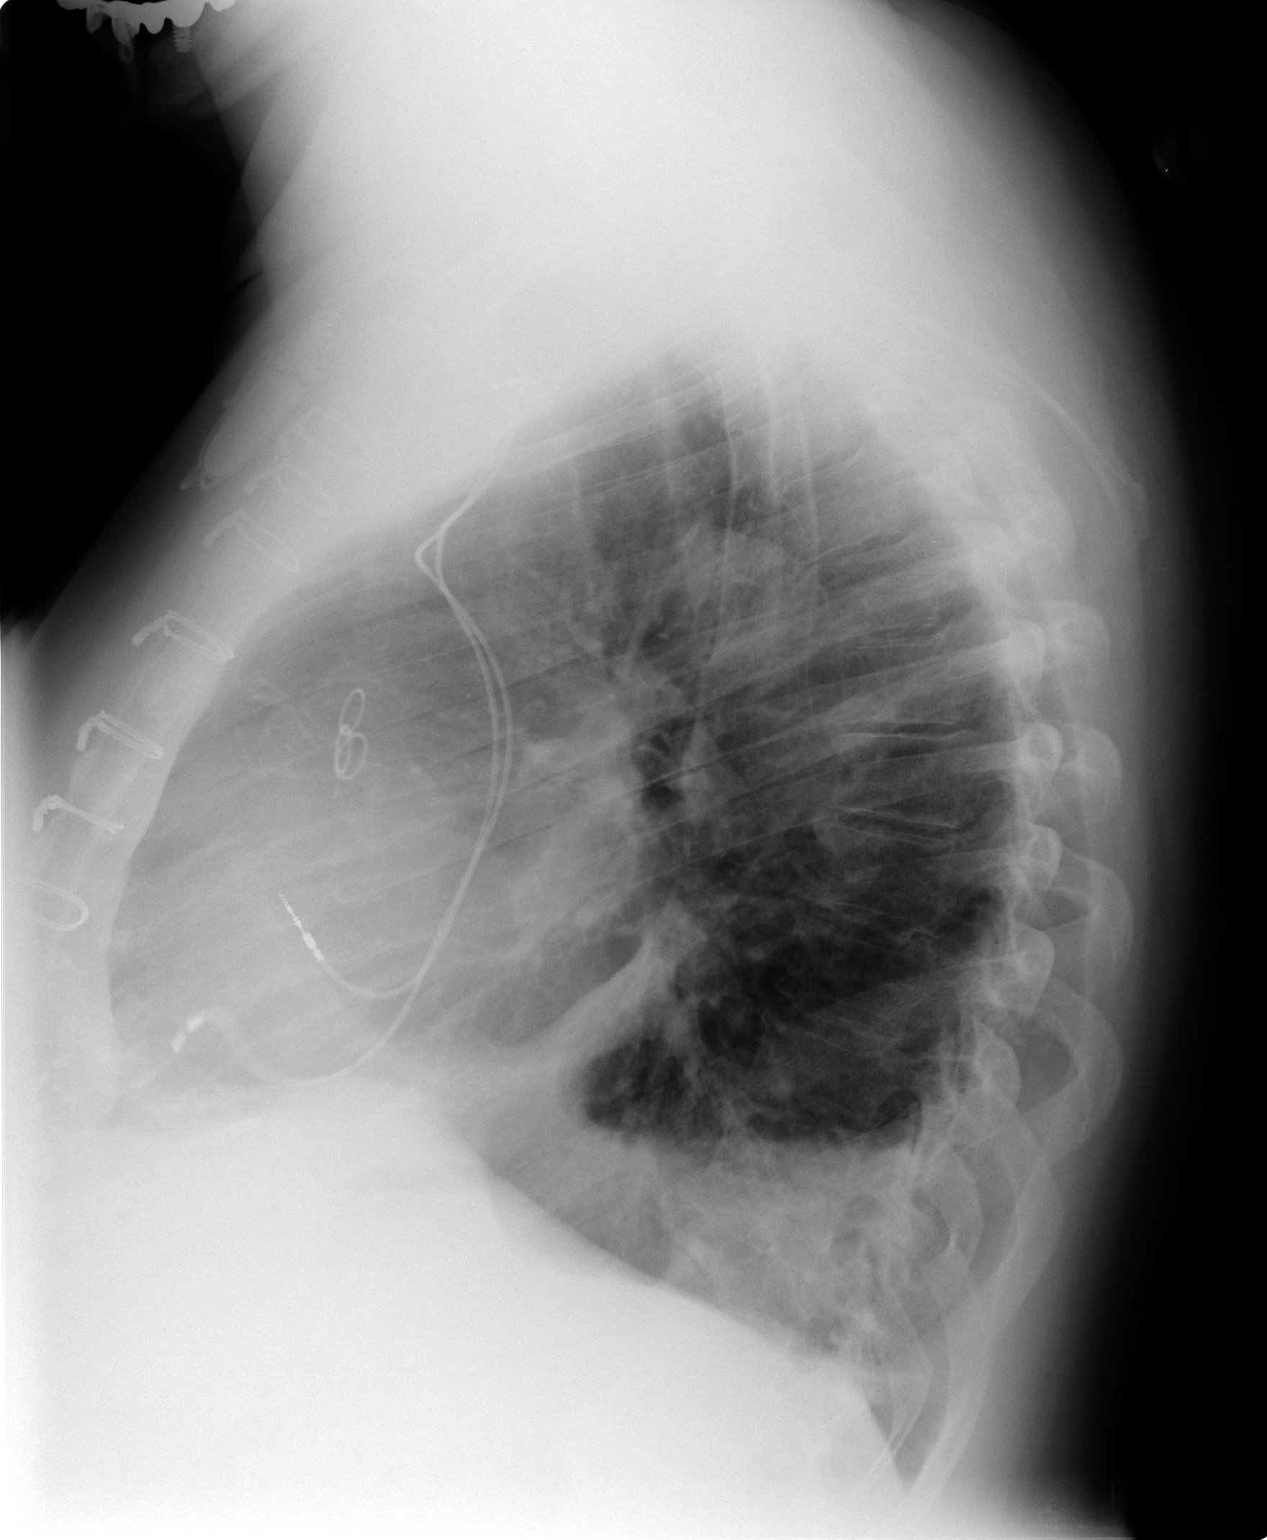

[2 of 2 positions shown; findings below may reference images not displayed]

FINDINGS: Small left pleural effusion seen on the comparison study is
decreased. There is no right pleural effusion. Mild left basilar
atelectasis is noted. There is cardiomegaly but no edema. The
patient is status post CABG with 7 intact median sternotomy wires.
Pacing device is unchanged
IMPRESSION: Decreased small left pleural effusion and basilar atelectasis. No
new abnormality.

## 2016-06-28 ENCOUNTER — Telehealth: Payer: Self-pay

## 2016-06-28 ENCOUNTER — Encounter (HOSPITAL_COMMUNITY)
Admission: RE | Admit: 2016-06-28 | Discharge: 2016-06-28 | Disposition: A | Discharge status: Home / Self Care | Payer: Self-pay | Source: Ambulatory Visit | Attending: Cardiology | Admitting: Cardiology

## 2016-06-28 DIAGNOSIS — I6529 Occlusion and stenosis of unspecified carotid artery: Secondary | ICD-10-CM

## 2016-06-28 NOTE — Telephone Encounter (Signed)
Informed patient of results and verbal understanding expressed.   Repeat carotids ordered to be scheduled in 2 years. Patient agrees with treatment plan. 

## 2016-06-28 NOTE — Telephone Encounter (Signed)
-----   Message from [name], MD sent at 06/28/2016 10:03 AM EST ----- Mild carotid artery stenosis 1-39% - repeat dopplers in 2 years

## 2016-06-30 ENCOUNTER — Encounter (HOSPITAL_COMMUNITY)
Admission: RE | Admit: 2016-06-30 | Discharge: 2016-06-30 | Disposition: A | Discharge status: Home / Self Care | Payer: Self-pay | Source: Ambulatory Visit | Attending: Cardiology | Admitting: Cardiology

## 2016-06-30 DIAGNOSIS — Z951 Presence of aortocoronary bypass graft: Secondary | ICD-10-CM | POA: Insufficient documentation

## 2016-07-03 ENCOUNTER — Encounter (HOSPITAL_COMMUNITY)
Admission: RE | Admit: 2016-07-03 | Discharge: 2016-07-03 | Disposition: A | Discharge status: Home / Self Care | Payer: Self-pay | Source: Ambulatory Visit | Attending: Cardiology | Admitting: Cardiology

## 2016-07-05 ENCOUNTER — Encounter (HOSPITAL_COMMUNITY)
Admission: RE | Admit: 2016-07-05 | Discharge: 2016-07-05 | Disposition: A | Discharge status: Home / Self Care | Payer: Self-pay | Source: Ambulatory Visit | Attending: Cardiology | Admitting: Cardiology

## 2016-07-07 ENCOUNTER — Encounter (HOSPITAL_COMMUNITY)
Admission: RE | Admit: 2016-07-07 | Discharge: 2016-07-07 | Disposition: A | Discharge status: Home / Self Care | Payer: Self-pay | Source: Ambulatory Visit | Attending: Cardiology | Admitting: Cardiology

## 2016-07-10 ENCOUNTER — Encounter (HOSPITAL_COMMUNITY)
Admission: RE | Admit: 2016-07-10 | Discharge: 2016-07-10 | Disposition: A | Discharge status: Home / Self Care | Payer: Self-pay | Source: Ambulatory Visit | Attending: Cardiology | Admitting: Cardiology

## 2016-07-12 ENCOUNTER — Encounter (HOSPITAL_COMMUNITY)
Admission: RE | Admit: 2016-07-12 | Discharge: 2016-07-12 | Disposition: A | Discharge status: Home / Self Care | Payer: Self-pay | Source: Ambulatory Visit | Attending: Cardiology | Admitting: Cardiology

## 2016-07-14 ENCOUNTER — Encounter (HOSPITAL_COMMUNITY)
Admission: RE | Admit: 2016-07-14 | Discharge: 2016-07-14 | Disposition: A | Discharge status: Home / Self Care | Payer: Self-pay | Source: Ambulatory Visit | Attending: Cardiology | Admitting: Cardiology

## 2016-07-17 ENCOUNTER — Encounter (HOSPITAL_COMMUNITY)
Admission: RE | Admit: 2016-07-17 | Discharge: 2016-07-17 | Disposition: A | Discharge status: Home / Self Care | Payer: Self-pay | Source: Ambulatory Visit | Attending: Cardiology | Admitting: Cardiology

## 2016-07-18 ENCOUNTER — Other Ambulatory Visit: Payer: Self-pay | Admitting: Cardiology

## 2016-07-19 ENCOUNTER — Encounter (HOSPITAL_COMMUNITY)
Admission: RE | Admit: 2016-07-19 | Discharge: 2016-07-19 | Disposition: A | Discharge status: Home / Self Care | Payer: Self-pay | Source: Ambulatory Visit | Attending: Cardiology | Admitting: Cardiology

## 2016-07-21 ENCOUNTER — Encounter (HOSPITAL_COMMUNITY)
Admission: RE | Admit: 2016-07-21 | Discharge: 2016-07-21 | Disposition: A | Discharge status: Home / Self Care | Payer: Self-pay | Source: Ambulatory Visit | Attending: Cardiology | Admitting: Cardiology

## 2016-07-24 ENCOUNTER — Encounter (HOSPITAL_COMMUNITY)
Admission: RE | Admit: 2016-07-24 | Discharge: 2016-07-24 | Disposition: A | Discharge status: Home / Self Care | Payer: Self-pay | Source: Ambulatory Visit | Attending: Cardiology | Admitting: Cardiology

## 2016-07-26 ENCOUNTER — Encounter (HOSPITAL_COMMUNITY)
Admission: RE | Admit: 2016-07-26 | Discharge: 2016-07-26 | Disposition: A | Discharge status: Home / Self Care | Payer: Self-pay | Source: Ambulatory Visit | Attending: Cardiology | Admitting: Cardiology

## 2016-07-28 ENCOUNTER — Encounter (HOSPITAL_COMMUNITY)
Admission: RE | Admit: 2016-07-28 | Discharge: 2016-07-28 | Disposition: A | Discharge status: Home / Self Care | Payer: Self-pay | Source: Ambulatory Visit | Attending: Cardiology | Admitting: Cardiology

## 2016-07-28 DIAGNOSIS — Z951 Presence of aortocoronary bypass graft: Secondary | ICD-10-CM | POA: Insufficient documentation

## 2016-07-31 ENCOUNTER — Encounter (HOSPITAL_COMMUNITY)
Admission: RE | Admit: 2016-07-31 | Discharge: 2016-07-31 | Disposition: A | Discharge status: Home / Self Care | Payer: Self-pay | Source: Ambulatory Visit | Attending: Cardiology | Admitting: Cardiology

## 2016-08-02 ENCOUNTER — Ambulatory Visit (INDEPENDENT_AMBULATORY_CARE_PROVIDER_SITE_OTHER): Payer: Medicare Other | Admitting: *Deleted

## 2016-08-02 ENCOUNTER — Encounter (HOSPITAL_COMMUNITY)
Admission: RE | Admit: 2016-08-02 | Discharge: 2016-08-02 | Disposition: A | Discharge status: Home / Self Care | Payer: Self-pay | Source: Ambulatory Visit | Attending: Cardiology | Admitting: Cardiology

## 2016-08-02 DIAGNOSIS — Z5181 Encounter for therapeutic drug level monitoring: Secondary | ICD-10-CM

## 2016-08-02 DIAGNOSIS — I4891 Unspecified atrial fibrillation: Secondary | ICD-10-CM

## 2016-08-02 DIAGNOSIS — I480 Paroxysmal atrial fibrillation: Secondary | ICD-10-CM

## 2016-08-02 LAB — POCT INR: INR: 2.9

## 2016-08-04 ENCOUNTER — Encounter (HOSPITAL_COMMUNITY)
Admission: RE | Admit: 2016-08-04 | Discharge: 2016-08-04 | Disposition: A | Discharge status: Home / Self Care | Payer: Self-pay | Source: Ambulatory Visit | Attending: Cardiology | Admitting: Cardiology

## 2016-08-07 ENCOUNTER — Encounter (HOSPITAL_COMMUNITY): Admission: RE | Admit: 2016-08-07 | Payer: Self-pay | Source: Ambulatory Visit

## 2016-08-07 ENCOUNTER — Telehealth (HOSPITAL_COMMUNITY): Payer: Self-pay | Admitting: *Deleted

## 2016-08-09 ENCOUNTER — Encounter (HOSPITAL_COMMUNITY)
Admission: RE | Admit: 2016-08-09 | Discharge: 2016-08-09 | Disposition: A | Discharge status: Home / Self Care | Payer: Self-pay | Source: Ambulatory Visit | Attending: Cardiology | Admitting: Cardiology

## 2016-08-11 ENCOUNTER — Encounter (HOSPITAL_COMMUNITY)
Admission: RE | Admit: 2016-08-11 | Discharge: 2016-08-11 | Disposition: A | Discharge status: Home / Self Care | Payer: Self-pay | Source: Ambulatory Visit | Attending: Cardiology | Admitting: Cardiology

## 2016-08-14 ENCOUNTER — Encounter (HOSPITAL_COMMUNITY)
Admission: RE | Admit: 2016-08-14 | Discharge: 2016-08-14 | Disposition: A | Discharge status: Home / Self Care | Payer: Self-pay | Source: Ambulatory Visit | Attending: Cardiology | Admitting: Cardiology

## 2016-08-16 ENCOUNTER — Encounter (HOSPITAL_COMMUNITY)
Admission: RE | Admit: 2016-08-16 | Discharge: 2016-08-16 | Disposition: A | Discharge status: Home / Self Care | Payer: Self-pay | Source: Ambulatory Visit | Attending: Cardiology | Admitting: Cardiology

## 2016-08-18 ENCOUNTER — Encounter (HOSPITAL_COMMUNITY)
Admission: RE | Admit: 2016-08-18 | Discharge: 2016-08-18 | Disposition: A | Discharge status: Home / Self Care | Payer: Self-pay | Source: Ambulatory Visit | Attending: Cardiology | Admitting: Cardiology

## 2016-08-21 ENCOUNTER — Encounter (HOSPITAL_COMMUNITY)
Admission: RE | Admit: 2016-08-21 | Discharge: 2016-08-21 | Disposition: A | Discharge status: Home / Self Care | Payer: Self-pay | Source: Ambulatory Visit | Attending: Cardiology | Admitting: Cardiology

## 2016-08-23 ENCOUNTER — Encounter (HOSPITAL_COMMUNITY)
Admission: RE | Admit: 2016-08-23 | Discharge: 2016-08-23 | Disposition: A | Discharge status: Home / Self Care | Payer: Self-pay | Source: Ambulatory Visit | Attending: Cardiology | Admitting: Cardiology

## 2016-08-25 ENCOUNTER — Encounter (HOSPITAL_COMMUNITY)
Admission: RE | Admit: 2016-08-25 | Discharge: 2016-08-25 | Disposition: A | Discharge status: Home / Self Care | Payer: Self-pay | Source: Ambulatory Visit | Attending: Cardiology | Admitting: Cardiology

## 2016-08-28 ENCOUNTER — Encounter (HOSPITAL_COMMUNITY): Payer: Self-pay

## 2016-08-28 DIAGNOSIS — Z951 Presence of aortocoronary bypass graft: Secondary | ICD-10-CM | POA: Insufficient documentation

## 2016-08-30 ENCOUNTER — Encounter (HOSPITAL_COMMUNITY)
Admission: RE | Admit: 2016-08-30 | Discharge: 2016-08-30 | Disposition: A | Discharge status: Home / Self Care | Payer: Self-pay | Source: Ambulatory Visit | Attending: Cardiology | Admitting: Cardiology

## 2016-09-01 ENCOUNTER — Encounter (HOSPITAL_COMMUNITY)
Admission: RE | Admit: 2016-09-01 | Discharge: 2016-09-01 | Disposition: A | Discharge status: Home / Self Care | Payer: Self-pay | Source: Ambulatory Visit | Attending: Cardiology | Admitting: Cardiology

## 2016-09-04 ENCOUNTER — Encounter (HOSPITAL_COMMUNITY)
Admission: RE | Admit: 2016-09-04 | Discharge: 2016-09-04 | Disposition: A | Discharge status: Home / Self Care | Payer: Self-pay | Source: Ambulatory Visit | Attending: Cardiology | Admitting: Cardiology

## 2016-09-06 ENCOUNTER — Encounter (HOSPITAL_COMMUNITY)
Admission: RE | Admit: 2016-09-06 | Discharge: 2016-09-06 | Disposition: A | Discharge status: Home / Self Care | Payer: Self-pay | Source: Ambulatory Visit | Attending: Cardiology | Admitting: Cardiology

## 2016-09-08 ENCOUNTER — Encounter (HOSPITAL_COMMUNITY)
Admission: RE | Admit: 2016-09-08 | Discharge: 2016-09-08 | Disposition: A | Discharge status: Home / Self Care | Payer: Self-pay | Source: Ambulatory Visit | Attending: Cardiology | Admitting: Cardiology

## 2016-09-11 ENCOUNTER — Telehealth: Payer: Self-pay | Admitting: Cardiology

## 2016-09-11 ENCOUNTER — Ambulatory Visit (INDEPENDENT_AMBULATORY_CARE_PROVIDER_SITE_OTHER): Payer: Medicare Other

## 2016-09-11 ENCOUNTER — Ambulatory Visit (INDEPENDENT_AMBULATORY_CARE_PROVIDER_SITE_OTHER): Payer: Medicare Other | Admitting: *Deleted

## 2016-09-11 ENCOUNTER — Encounter (HOSPITAL_COMMUNITY)
Admission: RE | Admit: 2016-09-11 | Discharge: 2016-09-11 | Disposition: A | Discharge status: Home / Self Care | Payer: Self-pay | Source: Ambulatory Visit | Attending: Cardiology | Admitting: Cardiology

## 2016-09-11 DIAGNOSIS — I495 Sick sinus syndrome: Secondary | ICD-10-CM

## 2016-09-11 DIAGNOSIS — Z5181 Encounter for therapeutic drug level monitoring: Secondary | ICD-10-CM

## 2016-09-11 DIAGNOSIS — I4891 Unspecified atrial fibrillation: Secondary | ICD-10-CM

## 2016-09-11 DIAGNOSIS — I480 Paroxysmal atrial fibrillation: Secondary | ICD-10-CM | POA: Diagnosis not present

## 2016-09-11 LAB — POCT INR: INR: 2.6

## 2016-09-11 NOTE — Telephone Encounter (Signed)
Confirmed remote transmission w/ pt wife.   

## 2016-09-11 NOTE — Progress Notes (Signed)
Remote pacemaker transmission.   

## 2016-09-12 LAB — CUP PACEART REMOTE DEVICE CHECK
Battery Impedance: 280 Ohm
Battery Voltage: 2.78 V
Brady Statistic AS VS Percent: 59 %
Date Time Interrogation Session: 20180416185102
Implantable Lead Implant Date: 20020926
Implantable Lead Location: 753859
Implantable Lead Model: 5076
Lead Channel Impedance Value: 955 Ohm
Lead Channel Setting Pacing Amplitude: 2 V
Lead Channel Setting Pacing Amplitude: 2.5 V
Lead Channel Setting Pacing Pulse Width: 0.4 ms
Lead Channel Setting Sensing Sensitivity: 5.6 mV
MDC IDC LEAD IMPLANT DT: 20020926
MDC IDC LEAD LOCATION: 753860
MDC IDC MSMT BATTERY REMAINING LONGEVITY: 118 mo
MDC IDC MSMT LEADCHNL RA IMPEDANCE VALUE: 526 Ohm
MDC IDC MSMT LEADCHNL RA PACING THRESHOLD AMPLITUDE: 0.5 V
MDC IDC MSMT LEADCHNL RA PACING THRESHOLD PULSEWIDTH: 0.4 ms
MDC IDC MSMT LEADCHNL RV PACING THRESHOLD AMPLITUDE: 1.125 V
MDC IDC MSMT LEADCHNL RV PACING THRESHOLD PULSEWIDTH: 0.4 ms
MDC IDC PG IMPLANT DT: 20120905
MDC IDC STAT BRADY AP VP PERCENT: 0 %
MDC IDC STAT BRADY AP VS PERCENT: 40 %
MDC IDC STAT BRADY AS VP PERCENT: 1 %

## 2016-09-13 ENCOUNTER — Encounter (HOSPITAL_COMMUNITY)
Admission: RE | Admit: 2016-09-13 | Discharge: 2016-09-13 | Disposition: A | Discharge status: Home / Self Care | Payer: Self-pay | Source: Ambulatory Visit | Attending: Cardiology | Admitting: Cardiology

## 2016-09-13 ENCOUNTER — Encounter: Payer: Self-pay | Admitting: Cardiology

## 2016-09-15 ENCOUNTER — Encounter (HOSPITAL_COMMUNITY)
Admission: RE | Admit: 2016-09-15 | Discharge: 2016-09-15 | Disposition: A | Discharge status: Home / Self Care | Payer: Self-pay | Source: Ambulatory Visit | Attending: Cardiology | Admitting: Cardiology

## 2016-09-18 ENCOUNTER — Encounter (HOSPITAL_COMMUNITY)
Admission: RE | Admit: 2016-09-18 | Discharge: 2016-09-18 | Disposition: A | Discharge status: Home / Self Care | Payer: Self-pay | Source: Ambulatory Visit | Attending: Cardiology | Admitting: Cardiology

## 2016-09-20 ENCOUNTER — Encounter (HOSPITAL_COMMUNITY)
Admission: RE | Admit: 2016-09-20 | Discharge: 2016-09-20 | Disposition: A | Discharge status: Home / Self Care | Payer: Self-pay | Source: Ambulatory Visit | Attending: Cardiology | Admitting: Cardiology

## 2016-09-22 ENCOUNTER — Encounter (HOSPITAL_COMMUNITY): Payer: Self-pay

## 2016-09-23 ENCOUNTER — Other Ambulatory Visit: Payer: Self-pay | Admitting: Cardiology

## 2016-09-25 ENCOUNTER — Encounter (HOSPITAL_COMMUNITY)
Admission: RE | Admit: 2016-09-25 | Discharge: 2016-09-25 | Disposition: A | Discharge status: Home / Self Care | Payer: Self-pay | Source: Ambulatory Visit | Attending: Cardiology | Admitting: Cardiology

## 2016-09-27 ENCOUNTER — Encounter (HOSPITAL_COMMUNITY)
Admission: RE | Admit: 2016-09-27 | Discharge: 2016-09-27 | Disposition: A | Discharge status: Home / Self Care | Payer: Self-pay | Source: Ambulatory Visit | Attending: Cardiology | Admitting: Cardiology

## 2016-09-27 DIAGNOSIS — Z951 Presence of aortocoronary bypass graft: Secondary | ICD-10-CM | POA: Insufficient documentation

## 2016-09-29 ENCOUNTER — Encounter (HOSPITAL_COMMUNITY)
Admission: RE | Admit: 2016-09-29 | Discharge: 2016-09-29 | Disposition: A | Discharge status: Home / Self Care | Payer: Self-pay | Source: Ambulatory Visit | Attending: Cardiology | Admitting: Cardiology

## 2016-10-02 ENCOUNTER — Encounter (HOSPITAL_COMMUNITY)
Admission: RE | Admit: 2016-10-02 | Discharge: 2016-10-02 | Disposition: A | Discharge status: Home / Self Care | Payer: Self-pay | Source: Ambulatory Visit | Attending: Cardiology | Admitting: Cardiology

## 2016-10-04 ENCOUNTER — Encounter (HOSPITAL_COMMUNITY)
Admission: RE | Admit: 2016-10-04 | Discharge: 2016-10-04 | Disposition: A | Discharge status: Home / Self Care | Payer: Self-pay | Source: Ambulatory Visit | Attending: Cardiology | Admitting: Cardiology

## 2016-10-06 ENCOUNTER — Encounter (HOSPITAL_COMMUNITY)
Admission: RE | Admit: 2016-10-06 | Discharge: 2016-10-06 | Disposition: A | Discharge status: Home / Self Care | Payer: Self-pay | Source: Ambulatory Visit | Attending: Cardiology | Admitting: Cardiology

## 2016-10-09 ENCOUNTER — Encounter (HOSPITAL_COMMUNITY)
Admission: RE | Admit: 2016-10-09 | Discharge: 2016-10-09 | Disposition: A | Discharge status: Home / Self Care | Payer: Self-pay | Source: Ambulatory Visit | Attending: Cardiology | Admitting: Cardiology

## 2016-10-11 ENCOUNTER — Encounter (HOSPITAL_COMMUNITY)
Admission: RE | Admit: 2016-10-11 | Discharge: 2016-10-11 | Disposition: A | Discharge status: Home / Self Care | Payer: Self-pay | Source: Ambulatory Visit | Attending: Cardiology | Admitting: Cardiology

## 2016-10-13 ENCOUNTER — Encounter (HOSPITAL_COMMUNITY)
Admission: RE | Admit: 2016-10-13 | Discharge: 2016-10-13 | Disposition: A | Discharge status: Home / Self Care | Payer: Self-pay | Source: Ambulatory Visit | Attending: Cardiology | Admitting: Cardiology

## 2016-10-16 ENCOUNTER — Encounter (HOSPITAL_COMMUNITY)
Admission: RE | Admit: 2016-10-16 | Discharge: 2016-10-16 | Disposition: A | Discharge status: Home / Self Care | Payer: Self-pay | Source: Ambulatory Visit | Attending: Cardiology | Admitting: Cardiology

## 2016-10-18 ENCOUNTER — Encounter (HOSPITAL_COMMUNITY)
Admission: RE | Admit: 2016-10-18 | Discharge: 2016-10-18 | Disposition: A | Discharge status: Home / Self Care | Payer: Self-pay | Source: Ambulatory Visit | Attending: Cardiology | Admitting: Cardiology

## 2016-10-20 ENCOUNTER — Encounter (HOSPITAL_COMMUNITY)
Admission: RE | Admit: 2016-10-20 | Discharge: 2016-10-20 | Disposition: A | Discharge status: Home / Self Care | Payer: Self-pay | Source: Ambulatory Visit | Attending: Cardiology | Admitting: Cardiology

## 2016-10-25 ENCOUNTER — Encounter (HOSPITAL_COMMUNITY)
Admission: RE | Admit: 2016-10-25 | Discharge: 2016-10-25 | Disposition: A | Discharge status: Home / Self Care | Payer: Self-pay | Source: Ambulatory Visit | Attending: Cardiology | Admitting: Cardiology

## 2016-10-27 ENCOUNTER — Encounter (HOSPITAL_COMMUNITY)
Admission: RE | Admit: 2016-10-27 | Discharge: 2016-10-27 | Disposition: A | Discharge status: Home / Self Care | Payer: Medicare Other | Source: Ambulatory Visit | Attending: Cardiology | Admitting: Cardiology

## 2016-10-27 ENCOUNTER — Ambulatory Visit (INDEPENDENT_AMBULATORY_CARE_PROVIDER_SITE_OTHER): Payer: Medicare Other | Admitting: *Deleted

## 2016-10-27 DIAGNOSIS — I480 Paroxysmal atrial fibrillation: Secondary | ICD-10-CM | POA: Diagnosis not present

## 2016-10-27 DIAGNOSIS — I4891 Unspecified atrial fibrillation: Secondary | ICD-10-CM

## 2016-10-27 DIAGNOSIS — Z5181 Encounter for therapeutic drug level monitoring: Secondary | ICD-10-CM

## 2016-10-27 DIAGNOSIS — Z951 Presence of aortocoronary bypass graft: Secondary | ICD-10-CM | POA: Insufficient documentation

## 2016-10-27 LAB — POCT INR: INR: 2.3

## 2016-10-30 ENCOUNTER — Encounter (HOSPITAL_COMMUNITY)
Admission: RE | Admit: 2016-10-30 | Discharge: 2016-10-30 | Disposition: A | Discharge status: Home / Self Care | Payer: Self-pay | Source: Ambulatory Visit | Attending: Cardiology | Admitting: Cardiology

## 2016-11-01 ENCOUNTER — Encounter (HOSPITAL_COMMUNITY)
Admission: RE | Admit: 2016-11-01 | Discharge: 2016-11-01 | Disposition: A | Discharge status: Home / Self Care | Payer: Medicare Other | Source: Ambulatory Visit | Attending: Cardiology | Admitting: Cardiology

## 2016-11-03 ENCOUNTER — Encounter (HOSPITAL_COMMUNITY)
Admission: RE | Admit: 2016-11-03 | Discharge: 2016-11-03 | Disposition: A | Discharge status: Home / Self Care | Payer: Self-pay | Source: Ambulatory Visit | Attending: Cardiology | Admitting: Cardiology

## 2016-11-06 ENCOUNTER — Encounter (HOSPITAL_COMMUNITY)
Admission: RE | Admit: 2016-11-06 | Discharge: 2016-11-06 | Disposition: A | Discharge status: Home / Self Care | Payer: Self-pay | Source: Ambulatory Visit | Attending: Cardiology | Admitting: Cardiology

## 2016-11-08 ENCOUNTER — Encounter (HOSPITAL_COMMUNITY)
Admission: RE | Admit: 2016-11-08 | Discharge: 2016-11-08 | Disposition: A | Discharge status: Home / Self Care | Payer: Self-pay | Source: Ambulatory Visit | Attending: Cardiology | Admitting: Cardiology

## 2016-11-10 ENCOUNTER — Encounter (HOSPITAL_COMMUNITY)
Admission: RE | Admit: 2016-11-10 | Discharge: 2016-11-10 | Disposition: A | Discharge status: Home / Self Care | Payer: Self-pay | Source: Ambulatory Visit | Attending: Cardiology | Admitting: Cardiology

## 2016-11-13 ENCOUNTER — Encounter (HOSPITAL_COMMUNITY)
Admission: RE | Admit: 2016-11-13 | Discharge: 2016-11-13 | Disposition: A | Discharge status: Home / Self Care | Payer: Self-pay | Source: Ambulatory Visit | Attending: Cardiology | Admitting: Cardiology

## 2016-11-15 ENCOUNTER — Encounter (HOSPITAL_COMMUNITY): Payer: Self-pay

## 2016-11-17 ENCOUNTER — Encounter (HOSPITAL_COMMUNITY): Payer: Self-pay

## 2016-11-20 ENCOUNTER — Encounter (HOSPITAL_COMMUNITY): Payer: Self-pay

## 2016-11-22 ENCOUNTER — Encounter (HOSPITAL_COMMUNITY): Payer: Self-pay

## 2016-11-24 ENCOUNTER — Encounter (HOSPITAL_COMMUNITY)
Admission: RE | Admit: 2016-11-24 | Discharge: 2016-11-24 | Disposition: A | Discharge status: Home / Self Care | Payer: Self-pay | Source: Ambulatory Visit | Attending: Cardiology | Admitting: Cardiology

## 2016-11-27 ENCOUNTER — Encounter (HOSPITAL_COMMUNITY)
Admission: RE | Admit: 2016-11-27 | Discharge: 2016-11-27 | Disposition: A | Discharge status: Home / Self Care | Payer: Self-pay | Source: Ambulatory Visit | Attending: Cardiology | Admitting: Cardiology

## 2016-11-27 DIAGNOSIS — Z951 Presence of aortocoronary bypass graft: Secondary | ICD-10-CM | POA: Insufficient documentation

## 2016-12-01 ENCOUNTER — Encounter (HOSPITAL_COMMUNITY)
Admission: RE | Admit: 2016-12-01 | Discharge: 2016-12-01 | Disposition: A | Discharge status: Home / Self Care | Payer: Self-pay | Source: Ambulatory Visit | Attending: Cardiology | Admitting: Cardiology

## 2016-12-01 ENCOUNTER — Ambulatory Visit (INDEPENDENT_AMBULATORY_CARE_PROVIDER_SITE_OTHER): Payer: Medicare Other | Admitting: *Deleted

## 2016-12-01 DIAGNOSIS — Z5181 Encounter for therapeutic drug level monitoring: Secondary | ICD-10-CM | POA: Diagnosis not present

## 2016-12-01 DIAGNOSIS — I4891 Unspecified atrial fibrillation: Secondary | ICD-10-CM | POA: Diagnosis not present

## 2016-12-01 DIAGNOSIS — I480 Paroxysmal atrial fibrillation: Secondary | ICD-10-CM

## 2016-12-01 LAB — POCT INR: INR: 2.4

## 2016-12-04 ENCOUNTER — Encounter (HOSPITAL_COMMUNITY): Payer: Self-pay

## 2016-12-06 ENCOUNTER — Encounter (HOSPITAL_COMMUNITY): Payer: Self-pay

## 2016-12-08 ENCOUNTER — Encounter (HOSPITAL_COMMUNITY): Payer: Self-pay

## 2016-12-11 ENCOUNTER — Encounter (HOSPITAL_COMMUNITY): Payer: Self-pay

## 2016-12-13 ENCOUNTER — Encounter (HOSPITAL_COMMUNITY)
Admission: RE | Admit: 2016-12-13 | Discharge: 2016-12-13 | Disposition: A | Discharge status: Home / Self Care | Payer: Self-pay | Source: Ambulatory Visit | Attending: Cardiology | Admitting: Cardiology

## 2016-12-14 ENCOUNTER — Other Ambulatory Visit: Payer: Self-pay | Admitting: Cardiology

## 2016-12-14 ENCOUNTER — Telehealth: Payer: Self-pay | Admitting: Cardiology

## 2016-12-14 ENCOUNTER — Ambulatory Visit: Payer: Medicare Other | Admitting: Cardiology

## 2016-12-14 ENCOUNTER — Ambulatory Visit (INDEPENDENT_AMBULATORY_CARE_PROVIDER_SITE_OTHER): Payer: Medicare Other | Admitting: *Deleted

## 2016-12-14 DIAGNOSIS — I495 Sick sinus syndrome: Secondary | ICD-10-CM | POA: Diagnosis not present

## 2016-12-14 NOTE — Progress Notes (Signed)
Remote pacemaker transmission.   

## 2016-12-14 NOTE — Telephone Encounter (Signed)
Spoke with pt and reminded pt of remote transmission that is due today. Pt verbalized understanding.   

## 2016-12-15 ENCOUNTER — Encounter (HOSPITAL_COMMUNITY)
Admission: RE | Admit: 2016-12-15 | Discharge: 2016-12-15 | Disposition: A | Discharge status: Home / Self Care | Payer: Self-pay | Source: Ambulatory Visit | Attending: Cardiology | Admitting: Cardiology

## 2016-12-15 LAB — CUP PACEART REMOTE DEVICE CHECK
Battery Impedance: 329 Ohm
Battery Voltage: 2.78 V
Brady Statistic AP VP Percent: 0 %
Brady Statistic AP VS Percent: 39 %
Brady Statistic AS VP Percent: 1 %
Brady Statistic AS VS Percent: 60 %
Date Time Interrogation Session: 20180719175520
Implantable Lead Implant Date: 20020926
Implantable Lead Location: 753859
Implantable Lead Model: 5076
Implantable Lead Model: 5092
Lead Channel Impedance Value: 518 Ohm
Lead Channel Impedance Value: 923 Ohm
Lead Channel Pacing Threshold Amplitude: 0.625 V
Lead Channel Pacing Threshold Pulse Width: 0.4 ms
Lead Channel Setting Pacing Amplitude: 2.5 V
MDC IDC LEAD IMPLANT DT: 20020926
MDC IDC LEAD LOCATION: 753860
MDC IDC MSMT BATTERY REMAINING LONGEVITY: 112 mo
MDC IDC MSMT LEADCHNL RV PACING THRESHOLD AMPLITUDE: 1 V
MDC IDC MSMT LEADCHNL RV PACING THRESHOLD PULSEWIDTH: 0.4 ms
MDC IDC PG IMPLANT DT: 20120905
MDC IDC SET LEADCHNL RA PACING AMPLITUDE: 2 V
MDC IDC SET LEADCHNL RV PACING PULSEWIDTH: 0.4 ms
MDC IDC SET LEADCHNL RV SENSING SENSITIVITY: 5.6 mV

## 2016-12-18 ENCOUNTER — Encounter (HOSPITAL_COMMUNITY)
Admission: RE | Admit: 2016-12-18 | Discharge: 2016-12-18 | Disposition: A | Discharge status: Home / Self Care | Payer: Self-pay | Source: Ambulatory Visit | Attending: Cardiology | Admitting: Cardiology

## 2016-12-20 ENCOUNTER — Encounter (HOSPITAL_COMMUNITY)
Admission: RE | Admit: 2016-12-20 | Discharge: 2016-12-20 | Disposition: A | Discharge status: Home / Self Care | Payer: Self-pay | Source: Ambulatory Visit | Attending: Cardiology | Admitting: Cardiology

## 2016-12-21 ENCOUNTER — Ambulatory Visit (INDEPENDENT_AMBULATORY_CARE_PROVIDER_SITE_OTHER): Payer: Medicare Other | Admitting: Physician Assistant

## 2016-12-21 ENCOUNTER — Encounter: Payer: Self-pay | Admitting: Cardiology

## 2016-12-21 ENCOUNTER — Encounter: Payer: Self-pay | Admitting: Physician Assistant

## 2016-12-21 VITALS — BP 98/62 | HR 71 | Ht 67.0 in | Wt 245.8 lb

## 2016-12-21 DIAGNOSIS — I350 Nonrheumatic aortic (valve) stenosis: Secondary | ICD-10-CM | POA: Diagnosis not present

## 2016-12-21 DIAGNOSIS — R001 Bradycardia, unspecified: Secondary | ICD-10-CM | POA: Diagnosis not present

## 2016-12-21 DIAGNOSIS — I10 Essential (primary) hypertension: Secondary | ICD-10-CM | POA: Diagnosis not present

## 2016-12-21 DIAGNOSIS — I25810 Atherosclerosis of coronary artery bypass graft(s) without angina pectoris: Secondary | ICD-10-CM

## 2016-12-21 DIAGNOSIS — I480 Paroxysmal atrial fibrillation: Secondary | ICD-10-CM

## 2016-12-21 DIAGNOSIS — I5042 Chronic combined systolic (congestive) and diastolic (congestive) heart failure: Secondary | ICD-10-CM | POA: Diagnosis not present

## 2016-12-21 DIAGNOSIS — I2510 Atherosclerotic heart disease of native coronary artery without angina pectoris: Secondary | ICD-10-CM | POA: Insufficient documentation

## 2016-12-21 NOTE — Progress Notes (Signed)
Cardiology Office Note    Date:  12/21/2016   ID:  Brandon Klein, DOB 09-10-1940, MRN [id number]  PCP:  Juluis Rainier, MD  Cardiologist: Dr. [name] EPS: Dr. Graciela Husbands  No chief complaint on file.   History of Present Illness:  Brandon Klein is a 76 y.o. male with CAD status post stenting of the LAD and balloon angioplasty of the circumflex. Status post CABG 3 2015 with LIMA to the LAD, SVG to the diagonal, SVG to the circumflex and SVG to the PDA with normal LV EF at that time. CHF 6 months after CABG was found to have severe LV dysfunction and cath 06/2014 occluded SVG to the P DA all other grafts patent. 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 ICD was considered but follow-up appointment with Dr. Graciela Husbands in August showed EF improved to 35-40% and it was not indicated. Patient also has moderate aortic stenosis. He has history of pacemaker for vasovagal syncope in 2002 with generator replaced in 2012. He also has paroxysmal atrial fibrillation on Coumadin.  Last 2-D echo 01/2016 LVEF 40% with hypokinesis worse in the inferior, inferoseptal and lateral walls. Mild LVH, grade 1 DD.  Patient comes in today for six-month follow-up. He is doing quite well. He still does cardiac rehabilitation 3 days a week and golfs as well. He denies any chest pain, palpitations, dyspnea, dyspnea on exertion, dizziness, or presyncope. He brought labs from his primary care and April and his triglycerides are up to 219 and he is borderline diabetic. Otherwise his labs look good.    Past Medical History:  Diagnosis Date  . AS (aortic stenosis)    mild by echo 10/2013  . Carotid artery plaque    a. Duplex 10/2013: mild calcific plaque origin ICA. Left: intimal wall thickening CCA. 0-39% BICA.  Marland Kitchen Chronic renal insufficiency   . Chronic systolic dysfunction of left ventricle   . Coronary artery disease    a. s/p PCI of LAD 1997. b. Cutting balloon angioplasty to LCx in 2002. c. s/p cath 11/2013 with severe  3 vessel ASCAD s/p CABG with LIMA to LAD, SVG to diag, SVG to left circ and SVG to PDA. d. cath 07/07/2014 occluded SVG to PDA, all other grafts patent. 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 DJD (degenerative joint disease)   . Epistaxis   . GERD (gastroesophageal reflux disease)   . GI bleeding    a.  with benign gastric polypectomy 05/2014.  Marland Kitchen HLD (hyperlipidemia)   . HTN (hypertension)   . Hypokalemia   . Hypomagnesemia   . Impaired fasting glucose    A1C check every year  . Obesity   . OSA (obstructive sleep apnea)   . Pacemaker    a. 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 b. MDT generator replacement 2012.  Marland Kitchen PAF (paroxysmal atrial fibrillation) (HCC)    on chronic systemic anticoagulation  . PVC's (premature ventricular contractions)   . Syncope    a. vasovagal with documented bradycardia.  . Type 2 diabetes mellitus without complications (HCC) 12/02/2015    Past Surgical History:  Procedure Laterality Date  . ANGIOPLASTY  1997  . CHOLECYSTECTOMY    . COLONOSCOPY WITH PROPOFOL N/A 05/05/2014   Procedure: COLONOSCOPY WITH PROPOFOL;  Surgeon: Charolett Bumpers, MD;  Location: [facility] ENDOSCOPY;  Service: Endoscopy;  Laterality: N/A;  . CORONARY ARTERY BYPASS GRAFT N/A 12/01/2013   Procedure: CORONARY ARTERY BYPASS GRAFT TIMES FOUR USING LEFT INTERNAL MAMMARY ARTERY TO LAD,  SAPHENOUS VEIN GRAFTS TO DIAGONAL, CIRCUMFELX AND POSTERIOR DESCENDING;  Surgeon: Kerin Perna, MD;  Location: [facility] OR;  Service: Open Heart Surgery;  Laterality: N/A;  . ESOPHAGOGASTRODUODENOSCOPY (EGD) WITH PROPOFOL N/A 05/05/2014   Procedure: ESOPHAGOGASTRODUODENOSCOPY (EGD) WITH PROPOFOL;  Surgeon: Charolett Bumpers, MD;  Location: [facility] ENDOSCOPY;  Service: Endoscopy;  Laterality: N/A;  . KNEE ARTHROSCOPY  2000   Left  . LEFT AND RIGHT HEART CATHETERIZATION WITH CORONARY/GRAFT ANGIOGRAM N/A 07/07/2014   Procedure: LEFT AND RIGHT HEART CATHETERIZATION WITH Isabel Caprice;  Surgeon: Kathleene Hazel, MD;  Location: [facility] CATH LAB;  Service: Cardiovascular;  Laterality: N/A;  . LEFT HEART CATHETERIZATION WITH CORONARY ANGIOGRAM N/A 11/25/2013   Procedure: LEFT HEART CATHETERIZATION WITH CORONARY ANGIOGRAM;  Surgeon: [name], MD;  Location: [facility] CATH LAB;  Service: Cardiovascular;  Laterality: N/A;  . PACEMAKER INSERTION  2002   generator change MDT by Dr Graciela Husbands in 2012  . TEE WITHOUT CARDIOVERSION N/A 08/31/2014   Procedure: TRANSESOPHAGEAL ECHOCARDIOGRAM (TEE);  Surgeon: [name], MD;  Location: [facility] ENDOSCOPY;  Service: Cardiovascular;  Laterality: N/A;    Current Medications: Current Meds  Medication Sig  . aspirin EC 81 MG EC tablet Take 1 tablet (81 mg total) by mouth daily. (Patient taking differently: Take 81 mg by mouth every morning. )  . atorvastatin (LIPITOR) 20 MG tablet Take 20 mg by mouth daily at 6 PM.   . carvedilol (COREG) 25 MG tablet TAKE 1/2 TABLET BY MOUTH TWO TIMES A DAY WITH MEAL  . Cholecalciferol (VITAMIN D) 2000 UNITS tablet Take 2,000 Units by mouth daily.  . Cyanocobalamin (VITAMIN B-12 PO) Take 1 tablet by mouth every morning.  . docusate sodium (COLACE) 100 MG capsule Take 100 mg by mouth 2 (two) times daily as needed for mild constipation.  . fexofenadine (ALLEGRA) 180 MG tablet Take 180 mg by mouth every morning.   . fluticasone (FLONASE) 50 MCG/ACT nasal spray Place 2 sprays into the nose every morning.   . furosemide (LASIX) 20 MG tablet TAKE 1 TABLET BY MOUTH TWICE A DAY TAKE WITH 80 MG TO MAKE TOTAL 100 MG TOTAL  . furosemide (LASIX) 80 MG tablet TAKE 1 TABLET BY MOUTH TWICE DAILY WITH 20 MG TO MAKE A TOTAL OF 100 MG  . Glucosamine-Chondroit-Vit C-Mn (GLUCOSAMINE CHONDR 1500 COMPLX) CAPS Take 1 capsule by mouth every morning.   . hydrALAZINE (APRESOLINE) 25 MG tablet TAKE 1 TABLET(25 MG) BY MOUTH THREE TIMES DAILY  . isosorbide mononitrate (IMDUR) 30 MG 24 hr tablet TAKE 1 TABLET BY MOUTH ONCE DAILY  . L-Lysine 500 MG CAPS Take 1 capsule by  mouth every morning.   . meclizine (ANTIVERT) 25 MG tablet Take 25 mg by mouth daily as needed for dizziness.   . Multiple Vitamin (MULTIVITAMIN) tablet Take 1 tablet by mouth every morning.   . nitroGLYCERIN (NITROSTAT) 0.4 MG SL tablet Place 1 tablet (0.4 mg total) under the tongue every 5 (five) minutes as needed for chest pain. (Patient taking differently: Place 0.4 mg under the tongue every 5 (five) minutes as needed for chest pain (MAX 3 TABLETS). )  . ONE TOUCH ULTRA TEST test strip 1 each by Other route every morning.   Letta Pate DELICA LANCETS FINE MISC 1 each by Other route every morning.   . pantoprazole (PROTONIX) 40 MG tablet Take 40 mg by mouth every morning.   . potassium chloride SA (K-DUR,KLOR-CON) 20 MEQ tablet TAKE 2 TABLETS BY MOUTH TWICE DAILY  .  RA NATURAL MAGNESIUM 250 MG TABS TAKE 2 TABLETS TWICE DAILY.  Marland Kitchen spironolactone (ALDACTONE) 25 MG tablet TAKE 1 TABLET(25 MG) BY MOUTH DAILY  . warfarin (COUMADIN) 5 MG tablet TAKE AS DIRECTED BY COUMADIN CLINIC     Allergies:   Acetaminophen-codeine; Amoxicillin; and Diovan [valsartan]   Social History   Social History  . Marital status: Married    Spouse name: N/A  . Number of children: N/A  . Years of education: N/A   Social History Main Topics  . Smoking status: Former Smoker    Packs/day: 2.00    Years: 5.00    Types: Cigarettes    Quit date: 05/29/1966  . Smokeless tobacco: Never Used  . Alcohol use 0.0 oz/week     Comment: ocassional  . Drug use: No  . Sexual activity: Not Asked   Other Topics Concern  . None   Social History Narrative   Lives in [place] with spouse.  Retired Medical illustrator     Family History:  The patient's   family history includes Colon cancer in his mother; Heart attack in his brother and mother; Leukemia in his father.   ROS:   Please see the history of present illness.    Review of Systems  Constitution: Negative.  HENT: Negative.   Cardiovascular: Negative.   Respiratory:  Negative.   Endocrine: Negative.   Hematologic/Lymphatic: Negative.   Musculoskeletal: Negative.   Gastrointestinal: Negative.   Genitourinary: Negative.   Neurological: Negative.    All other systems reviewed and are negative.   PHYSICAL EXAM:   VS:  BP 98/62 (BP Location: Right Arm)   Pulse 71   Ht 5\' 7"  (1.702 m)   Wt 245 lb 12.8 oz (111.5 kg)   BMI 38.50 kg/m   Physical Exam  GEN: Obese, in no acute distress  Neck: no JVD, carotid bruits, or masses Cardiac:RRR; positive S4, 2/6 systolic murmur at the left sternal border  Respiratory:  clear to auscultation bilaterally, normal work of breathing GI: soft, nontender, nondistended, + BS Ext: without cyanosis, clubbing, or edema, Good distal pulses bilaterally Neuro:  Alert and Oriented x 3 Psych: euthymic mood, full affect  Wt Readings from Last 3 Encounters:  12/21/16 245 lb 12.8 oz (111.5 kg)  06/07/16 244 lb 12.8 oz (111 kg)  03/13/16 241 lb 12.8 oz (109.7 kg)      Studies/Labs Reviewed:   EKG:  EKG is  ordered today.  The ekg ordered today demonstrates Atrial paced rhythm  Recent Labs: No results found for requested labs within last 8760 hours.   Lipid Panel    Component Value Date/Time   CHOL 154 06/04/2015 1216   CHOL 113 07/22/2014 1226   TRIG 196 (H) 06/04/2015 1216   TRIG 91 07/22/2014 1226   HDL 45 06/04/2015 1216   HDL 40 07/22/2014 1226   CHOLHDL 3.4 06/04/2015 1216   VLDL 39 (H) 06/04/2015 1216   LDLCALC 70 06/04/2015 1216   LDLCALC 55 07/22/2014 1226   LDLDIRECT 47.9 07/14/2013 0937    Additional studies/ records that were reviewed today include:   2-D echo 01/2016 Study Conclusions   - Left ventricle: Difficult acoustic windows OVreall LVEF appears   depressed at approximately 40% with hypokinesis worse in the   inferior, inferoseptal and lateral walls; basal   inferior/inferoseptal akinesis The cavity size was mildly   dilated. Wall thickness was increased in a pattern of mild LVH.    Doppler parameters are consistent with abnormal left ventricular  relaxation (grade 1 diastolic dysfunction). - Aortic valve: AV is thickened, calcified with restricted motion   Difficult to see well Peak and mean gradients through the valve   are 49 and 28 mm Hg respectively consistent with moderate AS. - Right ventricle: Systolic function was mildly reduced.     ASSESSMENT:    1. Coronary artery disease involving coronary bypass graft of native heart without angina pectoris   2. Aortic valve stenosis, etiology of cardiac valve disease unspecified   3. Chronic combined systolic and diastolic CHF (congestive heart failure) (HCC)   4. Essential hypertension   5. PAF (paroxysmal atrial fibrillation) (HCC)   6. Bradycardia s/p MDT pacemaker      PLAN:  In order of problems listed above:  CAD status post CABG in 2015 doing well without angina. Developed cardiomyopathy after CABG but most recent LVEF 40% in 01/2016. Continue cardiac rehabilitation and risk factor reduction.  Moderate aortic stenosis we'll repeat 2-D echo 01/2017 to follow. No increase in symptoms.  Chronic combined systolic and diastolic CHF currently compensated. Patient's legs only swell if he misses his Lasix. He is taking 100 mg twice daily. Recent labs done by primary care in April were stable. Creatinine was 1.49.  Essential hypertension blood pressure on the low side but asymptomatic.  PAF on Coumadin follow-up with EPS in October.  Pacemaker for bradycardia followed by Dr. Graciela Husbands  Medication Adjustments/Labs and Tests Ordered: Current medicines are reviewed at length with the patient today.  Concerns regarding medicines are outlined above.  Medication changes, Labs and Tests ordered today are listed in the Patient Instructions below. There are no Patient Instructions on file for this visit.   Signed, Jacolyn Reedy, PA-C  12/21/2016 1:57 PM    [facility] [address] [place], [place],  [place]  [address] Phone: [phone]; Fax: [phone]

## 2016-12-21 NOTE — Patient Instructions (Signed)
Medication Instructions:  Your physician recommends that you continue on your current medications as directed. Please refer to the Current Medication list given to you today.   Labwork: -None  Testing/Procedures: Your physician has requested that you have an echocardiogram. Echocardiography is a painless test that uses sound waves to create images of your heart. It provides your doctor with information about the size and shape of your heart and how well your heart's chambers and valves are working. This procedure takes approximately one hour. There are no restrictions for this procedure.    Follow-Up: Your physician wants you to follow-up in: in January with Dr. [name]. You will receive a reminder letter in the mail two months in advance. If you don't receive a letter, please call our office to schedule the follow-up appointment.  Your physician recommends that you keep your scheduled follow-up appointment in October with [name], [place].   Any Other Special Instructions Will Be Listed Below (If Applicable).     If you need a refill on your cardiac medications before your next appointment, please call your pharmacy.

## 2016-12-22 ENCOUNTER — Encounter (HOSPITAL_COMMUNITY)
Admission: RE | Admit: 2016-12-22 | Discharge: 2016-12-22 | Disposition: A | Discharge status: Home / Self Care | Payer: Self-pay | Source: Ambulatory Visit | Attending: Cardiology | Admitting: Cardiology

## 2016-12-25 ENCOUNTER — Encounter (HOSPITAL_COMMUNITY)
Admission: RE | Admit: 2016-12-25 | Discharge: 2016-12-25 | Disposition: A | Discharge status: Home / Self Care | Payer: Self-pay | Source: Ambulatory Visit | Attending: Cardiology | Admitting: Cardiology

## 2016-12-27 ENCOUNTER — Encounter (HOSPITAL_COMMUNITY)
Admission: RE | Admit: 2016-12-27 | Discharge: 2016-12-27 | Disposition: A | Discharge status: Home / Self Care | Payer: Self-pay | Source: Ambulatory Visit | Attending: Cardiology | Admitting: Cardiology

## 2016-12-27 DIAGNOSIS — Z951 Presence of aortocoronary bypass graft: Secondary | ICD-10-CM | POA: Insufficient documentation

## 2016-12-29 ENCOUNTER — Encounter (HOSPITAL_COMMUNITY)
Admission: RE | Admit: 2016-12-29 | Discharge: 2016-12-29 | Disposition: A | Discharge status: Home / Self Care | Payer: Self-pay | Source: Ambulatory Visit | Attending: Cardiology | Admitting: Cardiology

## 2017-01-01 ENCOUNTER — Encounter (HOSPITAL_COMMUNITY): Payer: Self-pay

## 2017-01-03 ENCOUNTER — Encounter (HOSPITAL_COMMUNITY): Payer: Self-pay

## 2017-01-05 ENCOUNTER — Encounter (HOSPITAL_COMMUNITY)
Admission: RE | Admit: 2017-01-05 | Discharge: 2017-01-05 | Disposition: A | Discharge status: Home / Self Care | Payer: Self-pay | Source: Ambulatory Visit | Attending: Cardiology | Admitting: Cardiology

## 2017-01-08 ENCOUNTER — Encounter (HOSPITAL_COMMUNITY)
Admission: RE | Admit: 2017-01-08 | Discharge: 2017-01-08 | Disposition: A | Discharge status: Home / Self Care | Payer: Self-pay | Source: Ambulatory Visit | Attending: Cardiology | Admitting: Cardiology

## 2017-01-10 ENCOUNTER — Encounter (HOSPITAL_COMMUNITY)
Admission: RE | Admit: 2017-01-10 | Discharge: 2017-01-10 | Disposition: A | Discharge status: Home / Self Care | Payer: Self-pay | Source: Ambulatory Visit | Attending: Cardiology | Admitting: Cardiology

## 2017-01-11 ENCOUNTER — Telehealth: Payer: Self-pay | Admitting: Internal Medicine

## 2017-01-11 NOTE — Telephone Encounter (Signed)
New message    Pt is calling about his home transmission in October.

## 2017-01-11 NOTE — Telephone Encounter (Signed)
Spoke with Mr. Broadwater, informed him that we are know scheduling remotes every 91 days no matter when an in office appointment, d/t patients being lost to follow-up. Informed him that at the in office appointment he request the remote be pushed out 91 days from that in office appointment. Pt voiced understanding.

## 2017-01-12 ENCOUNTER — Encounter (HOSPITAL_COMMUNITY)
Admission: RE | Admit: 2017-01-12 | Discharge: 2017-01-12 | Disposition: A | Discharge status: Home / Self Care | Payer: Self-pay | Source: Ambulatory Visit | Attending: Cardiology | Admitting: Cardiology

## 2017-01-12 ENCOUNTER — Ambulatory Visit (INDEPENDENT_AMBULATORY_CARE_PROVIDER_SITE_OTHER): Payer: Medicare Other

## 2017-01-12 ENCOUNTER — Other Ambulatory Visit: Payer: Self-pay | Admitting: Cardiology

## 2017-01-12 DIAGNOSIS — I480 Paroxysmal atrial fibrillation: Secondary | ICD-10-CM

## 2017-01-12 DIAGNOSIS — I4891 Unspecified atrial fibrillation: Secondary | ICD-10-CM

## 2017-01-12 DIAGNOSIS — Z5181 Encounter for therapeutic drug level monitoring: Secondary | ICD-10-CM | POA: Diagnosis not present

## 2017-01-12 LAB — POCT INR: INR: 2.4

## 2017-01-15 ENCOUNTER — Encounter (HOSPITAL_COMMUNITY)
Admission: RE | Admit: 2017-01-15 | Discharge: 2017-01-15 | Disposition: A | Discharge status: Home / Self Care | Payer: Self-pay | Source: Ambulatory Visit | Attending: Cardiology | Admitting: Cardiology

## 2017-01-17 ENCOUNTER — Encounter (HOSPITAL_COMMUNITY)
Admission: RE | Admit: 2017-01-17 | Discharge: 2017-01-17 | Disposition: A | Discharge status: Home / Self Care | Payer: Self-pay | Source: Ambulatory Visit | Attending: Cardiology | Admitting: Cardiology

## 2017-01-18 ENCOUNTER — Ambulatory Visit: Payer: Medicare Other | Attending: Family Medicine | Admitting: Physical Therapy

## 2017-01-18 DIAGNOSIS — M79605 Pain in left leg: Secondary | ICD-10-CM | POA: Diagnosis present

## 2017-01-18 NOTE — Therapy (Signed)
[facility] [address] [place], [place], [address] Phone: [phone]   Fax:  [phone]  Physical Therapy Evaluation  Patient Details  Name: Brandon Klein MRN: [id number] Date of Birth: 1940/08/10 Referring Provider: Dr. Juluis Rainier  Encounter Date: 01/18/2017      PT End of Session - 01/18/17 1407    Visit Number 1   Number of Visits 6   Date for PT Re-Evaluation 03/01/17   Authorization Type UHC Medicare $40 copay   PT Start Time 1330   PT Stop Time 1405   PT Time Calculation (min) 35 min   Activity Tolerance Patient tolerated treatment well   Behavior During Therapy [facility] for tasks assessed/performed      Past Medical History:  Diagnosis Date  . AS (aortic stenosis)    mild by echo 10/2013  . Carotid artery plaque    a. Duplex 10/2013: mild calcific plaque origin ICA. Left: intimal wall thickening CCA. 0-39% BICA.  Marland Kitchen Chronic renal insufficiency   . Chronic systolic dysfunction of left ventricle   . Coronary artery disease    a. s/p PCI of LAD 1997. b. Cutting balloon angioplasty to LCx in 2002. c. s/p cath 11/2013 with severe 3 vessel ASCAD s/p CABG with LIMA to LAD, SVG to diag, SVG to left circ and SVG to PDA. d. cath 07/07/2014 occluded SVG to PDA, all other grafts patent. 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 DJD (degenerative joint disease)   . Epistaxis   . GERD (gastroesophageal reflux disease)   . GI bleeding    a.  with benign gastric polypectomy 05/2014.  Marland Kitchen HLD (hyperlipidemia)   . HTN (hypertension)   . Hypokalemia   . Hypomagnesemia   . Impaired fasting glucose    A1C check every year  . Obesity   . OSA (obstructive sleep apnea)   . Pacemaker    a. 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 b. MDT generator replacement 2012.  Marland Kitchen PAF (paroxysmal atrial fibrillation) (HCC)    on chronic systemic anticoagulation  . PVC's (premature ventricular contractions)   . Syncope    a. vasovagal with  documented bradycardia.  . Type 2 diabetes mellitus without complications (HCC) 12/02/2015    Past Surgical History:  Procedure Laterality Date  . ANGIOPLASTY  1997  . CHOLECYSTECTOMY    . COLONOSCOPY WITH PROPOFOL N/A 05/05/2014   Procedure: COLONOSCOPY WITH PROPOFOL;  Surgeon: Charolett Bumpers, MD;  Location: [facility] ENDOSCOPY;  Service: Endoscopy;  Laterality: N/A;  . CORONARY ARTERY BYPASS GRAFT N/A 12/01/2013   Procedure: CORONARY ARTERY BYPASS GRAFT TIMES FOUR USING LEFT INTERNAL MAMMARY ARTERY TO LAD, SAPHENOUS VEIN GRAFTS TO DIAGONAL, CIRCUMFELX AND POSTERIOR DESCENDING;  Surgeon: Kerin Perna, MD;  Location: [facility] OR;  Service: Open Heart Surgery;  Laterality: N/A;  . ESOPHAGOGASTRODUODENOSCOPY (EGD) WITH PROPOFOL N/A 05/05/2014   Procedure: ESOPHAGOGASTRODUODENOSCOPY (EGD) WITH PROPOFOL;  Surgeon: Charolett Bumpers, MD;  Location: [facility] ENDOSCOPY;  Service: Endoscopy;  Laterality: N/A;  . KNEE ARTHROSCOPY  2000   Left  . LEFT AND RIGHT HEART CATHETERIZATION WITH CORONARY/GRAFT ANGIOGRAM N/A 07/07/2014   Procedure: LEFT AND RIGHT HEART CATHETERIZATION WITH Isabel Caprice;  Surgeon: Kathleene Hazel, MD;  [facility] C[facility] LAB;  Service: Cardiovascular;  Laterality: N/A;  . LEFT HEART CATHETERIZATION WITH CORONARY ANGIOGRAM N/A 11/25/2013   Procedure: LEFT HEART CATHETERIZATION WITH CORONARY ANGIOGRAM;  Surgeon: [name], MD;  Location: [facility] CATH LAB;  Service: Cardiovascular;  Laterality:  N/A;  . PACEMAKER INSERTION  2002   generator change MDT by Dr Graciela Husbands in 2012  . TEE WITHOUT CARDIOVERSION N/A 08/31/2014   Procedure: TRANSESOPHAGEAL ECHOCARDIOGRAM (TEE);  Surgeon: [name], MD;  Location: [facility] ENDOSCOPY;  Service: Cardiovascular;  Laterality: N/A;    There were no vitals filed for this visit.       Subjective Assessment - 01/18/17 1334    Subjective Pt is a 76 y/o male who presents to OPPT for Lt hamstring tear x 3 weeks.  Injury occured while donning underwear  standing up.  Pt reports improvement in symptoms, but continues to have intermittent pain with lifting and extensive bruising in LLE.  Pt followed up with MD ~ 1 week after injury.   Limitations Lifting   Patient Stated Goals "this may be the last time I see you all"   Currently in Pain? Yes   Pain Score 0-No pain  up to 5/10   Pain Location Leg   Pain Orientation Upper;Posterior   Pain Descriptors / Indicators Sharp  pulling   Pain Type Acute pain   Pain Onset 1 to 4 weeks ago   Pain Frequency Intermittent   Aggravating Factors  lifting   Pain Relieving Factors avoid lifting            [facility] PT Assessment - 01/18/17 1339      Assessment   Medical Diagnosis Lt hamstring tear   Referring Provider Dr. Juluis Rainier   Onset Date/Surgical Date 12/27/16   Next MD Visit PRN   Prior Therapy n/a     Precautions   Precautions None     Restrictions   Weight Bearing Restrictions No     Balance Screen   Has the patient fallen in the past 6 months No   Has the patient had a decrease in activity level because of a fear of falling?  No   Is the patient reluctant to leave their home because of a fear of falling?  No     Home [employer]ist information centre manager residence   Living Arrangements Spouse/significant other  30# dog (needs physical assistance)   Type of Home House   Home Access Stairs to enter   Entrance Stairs-Number of Steps 2   Entrance Stairs-Rails None  holds onto post   Home Layout Two level     Prior Function   Level of Independence Independent   Vocation Retired   Location manager, read, Scientist, physiological (sports/history); M/W/F Cardiac Rehab     Cognition   Overall Cognitive Status Within Functional Limits for tasks assessed     Observation/Other Assessments   Observations increased edema noted in LLE; encouraged ankle pumps   Skin Integrity extensive bruising noted on LLE (pt on blood thinners)     ROM /  Strength   AROM / PROM / Strength AROM;Strength     AROM   Overall AROM Comments bil LEs WNL     Strength   Strength Assessment Site Hip;Knee   Right/Left Hip Right;Left   Right Hip Flexion 5/5   Right Hip Extension 5/5   Right Hip ABduction 5/5   Left Hip Flexion 5/5   Left Hip Extension 4/5   Left Hip ABduction 5/5   Left Hip ADduction 5/5   Right/Left Knee Right;Left   Right Knee Flexion 5/5   Right Knee Extension 5/5   Left Knee Flexion 4-/5  with pain   Left  Knee Extension 5/5     Flexibility   Soft Tissue Assessment /Muscle Length yes   Hamstrings tightness on Lt   Quadriceps tightness on Lt     Palpation   Palpation comment tenderness distal hamstrings with palpable muscle belly tightness            Objective measurements completed on examination: See above findings.          [facility] Adult PT Treatment/Exercise - 2017/02/06 1406      Self-Care   Self-Care Other Self-Care Comments   Other Self-Care Comments  verbally reviewed HEP and pt performed 1-3 reps of each exercise                PT Education - 02/06/17 1407    Education provided Yes   Education Details HEP, POC   Person(s) Educated Patient   Methods Explanation;Demonstration;Handout   Comprehension Verbalized understanding;Returned demonstration;Need further instruction             PT Long Term Goals - 2017-02-06 1410      PT LONG TERM GOAL #1   Title independent with HEP   Time 6   Period Weeks   Status New   Target Date 03/01/17     PT LONG TERM GOAL #2   Title demonstrate ability to lift at least 20# from floor to bed height without increase in symptoms in order to care for elderly dog   Time 6   Period Weeks   Status New   Target Date 03/01/17     PT LONG TERM GOAL #3   Title report no pain with golfing for improved function   Time 6   Period Weeks   Status New   Target Date 03/01/17                Plan - Feb 06, 2017 1408    Clinical Impression  Statement Pt is a 76 y/o male who presents to OPPT s/p Lt hamstring tear.  Pt presents today with pain and difficulty performing household activities affecting function.  Pt overall with improvements from rest so will plan to see up to 1x/wk to monitor progress and update HEP to address deficits following injury.   Clinical Presentation Stable   Clinical Decision Making Low   Rehab Potential Good   PT Frequency 1x / week  will see up to 1x/wk, plan for biweekly at this time   PT Duration 6 weeks   PT Treatment/Interventions ADLs/Self Care Home Management;Cryotherapy;Electrical Stimulation;Moist Heat;Ultrasound;Therapeutic exercise;Therapeutic activities;Functional mobility training;Stair training;Gait training;Patient/family education;Manual techniques;Taping;Dry needling   PT Next Visit Plan review HEP, modalities PRN (?pulsed ultrasound), progress HEP as indicated   Consulted and Agree with Plan of Care Patient      Patient will benefit from skilled therapeutic intervention in order to improve the following deficits and impairments:  Improper body mechanics, Impaired flexibility, Pain, Increased fascial restricitons, Increased muscle spasms, Decreased strength  Visit Diagnosis: Pain in left leg - Plan: PT plan of care cert/re-cert      G-Codes - Feb 06, 2017 1413    Functional Assessment Tool Used (Outpatient Only) clinical judgement   Functional Limitation Carrying, moving and handling objects   Carrying, Moving and Handling Objects Current Status (Z6109) At least 1 percent but less than 20 percent impaired, limited or restricted   Carrying, Moving and Handling Objects Goal Status (U0454) At least 1 percent but less than 20 percent impaired, limited or restricted       Problem List Patient Active Problem  List   Diagnosis Date Noted  . CAD (coronary artery disease) 12/21/2016  . Anemia 12/02/2015  . Nonrheumatic aortic valve disorder 12/02/2015  . Type 2 diabetes mellitus without  complications (HCC) 12/02/2015  . Gastro-esophageal reflux disease without esophagitis 12/02/2015  . Other fecal abnormalities 12/02/2015  . Hypertension 12/02/2015  . Osteoarthritis 12/02/2015  . Dizziness and giddiness 12/02/2015  . Encounter for therapeutic drug monitoring 08/07/2014  . History of GI bleed 2/2 duodenal polyp 08/04/2014  . Hyperlipidemia 06/03/2014  . Upper airway cough syndrome 04/28/2014  . Breath shortness 04/13/2014  . Carotid atherosclerosis   . OSA (obstructive sleep apnea)   . PVC's (premature ventricular contractions)   . Hypomagnesemia   . Hypokalemia   . Chronic combined systolic and diastolic CHF (congestive heart failure) (HCC)   . Obesity (BMI 30-39.9) 02/04/2014  . AS (aortic stenosis)   . Chronic anticoagulation 11/27/2013  . Hyperglycemia 11/26/2013  . PAF (paroxysmal atrial fibrillation) (HCC)   . Dyslipidemia 07/08/2013  . Bradycardia s/p MDT pacemaker   . Syncope   . 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

## 2017-01-18 NOTE — Patient Instructions (Signed)
HIP: Hamstrings - Short Sitting    Rest leg on raised surface. Keep knee straight. Lift chest. Hold _30__ seconds. _2-3__ reps per set, __2-3 sets per day, _6-7__ days per week   KNEE: Quadriceps - Prone    Place strap around ankle. Bring ankle toward buttocks. Press hip into surface. Hold _30__ seconds. _2-3__ reps per set, __2-3_ sets per day, __6-7_ days per week   Bridging    Slowly raise buttocks from floor, and hold for 5 seconds. Repeat __10__ times per set. Do _1___ sets per session. Do __2-3__ sessions per day.

## 2017-01-19 ENCOUNTER — Encounter (HOSPITAL_COMMUNITY)
Admission: RE | Admit: 2017-01-19 | Discharge: 2017-01-19 | Disposition: A | Discharge status: Home / Self Care | Payer: Self-pay | Source: Ambulatory Visit | Attending: Cardiology | Admitting: Cardiology

## 2017-01-22 ENCOUNTER — Encounter (HOSPITAL_COMMUNITY)
Admission: RE | Admit: 2017-01-22 | Discharge: 2017-01-22 | Disposition: A | Discharge status: Home / Self Care | Payer: Self-pay | Source: Ambulatory Visit | Attending: Cardiology | Admitting: Cardiology

## 2017-01-24 ENCOUNTER — Encounter (HOSPITAL_COMMUNITY)
Admission: RE | Admit: 2017-01-24 | Discharge: 2017-01-24 | Disposition: A | Discharge status: Home / Self Care | Payer: Self-pay | Source: Ambulatory Visit | Attending: Cardiology | Admitting: Cardiology

## 2017-01-26 ENCOUNTER — Encounter (HOSPITAL_COMMUNITY): Payer: Self-pay

## 2017-01-31 ENCOUNTER — Encounter (HOSPITAL_COMMUNITY)
Admission: RE | Admit: 2017-01-31 | Discharge: 2017-01-31 | Disposition: A | Discharge status: Home / Self Care | Payer: Self-pay | Source: Ambulatory Visit | Attending: Cardiology | Admitting: Cardiology

## 2017-01-31 DIAGNOSIS — Z951 Presence of aortocoronary bypass graft: Secondary | ICD-10-CM | POA: Insufficient documentation

## 2017-02-01 ENCOUNTER — Encounter: Payer: Self-pay | Admitting: Physical Therapy

## 2017-02-01 ENCOUNTER — Ambulatory Visit: Payer: Medicare Other | Attending: Family Medicine | Admitting: Physical Therapy

## 2017-02-01 DIAGNOSIS — M79605 Pain in left leg: Secondary | ICD-10-CM | POA: Insufficient documentation

## 2017-02-01 NOTE — Patient Instructions (Signed)
Do one hamstring stretch 2 to 3 x a day.  You do not need to do every one of the following Leg Extension (Hamstring)   Sit toward front edge of chair, with leg out straight, heel on floor, toes pointing toward body. Keeping back straight, bend forward at hip, breathing out through pursed lips. Return, breathing in. Repeat _3__ times. Repeat with other leg. Do _2-3__ sessions per day. Variation: Perform from standing position, with support.    Hamstring Stretch   With other leg bent, foot flat, grasp right leg and slowly try to straighten knee. Hold __30__ seconds. Repeat _3___ times. Do __2-3__ sessions per day.       Hamstring Step 3   Left leg in maximal straight leg raise, heel at maximal stretch, straighten knee further by tightening knee cap. Warning: Intense stretch. Stay within tolerance. Hold 30___ seconds. Relax knee cap only. Repeat __3_ times.   FLEXION: Standing - Stable (Active)    Stand, both feet flat. Bend right knee, bringing heel toward buttocks. Use 3___ lbs. Up to 10 lb as you progress Complete _2__ sets of __15_ repetitions. Perform  1___ sessions per day.  http://gtsc.exer.us/240   Copyright  VHI. All rights reserved.        Garen Lah, PT Certified Exercise Expert for the Aging Adult  02/01/17 12:21 PM Phone: [phone] Fax: [phone]

## 2017-02-01 NOTE — Therapy (Addendum)
[place], [place], [address] Phone: [phone]   Fax:  [phone]  Physical Therapy Treatment/Discharge Note  Patient Details  Name: Brandon Klein MRN: [id number] Date of Birth: 03/20/1941 Referring Provider: Dr. Leighton Ruff  Encounter Date: 02/01/2017      PT End of Session - 02/01/17 1241    Visit Number 2   Number of Visits 6   Date for PT Re-Evaluation 03/01/17   Authorization Type UHC Medicare $40 copay   PT Start Time 1145   PT Stop Time 1240   PT Time Calculation (min) 55 min   Activity Tolerance Patient tolerated treatment well   Behavior During Therapy [facility] for tasks assessed/performed      Past Medical History:  Diagnosis Date  . AS (aortic stenosis)    mild by echo 10/2013  . Carotid artery plaque    a. Duplex 10/2013: mild calcific plaque origin ICA. Left: intimal wall thickening CCA. 0-39% BICA.  Marland Kitchen Chronic renal insufficiency   . Chronic systolic dysfunction of left ventricle   . Coronary artery disease    a. s/p PCI of LAD 1997. b. Cutting balloon angioplasty to LCx in 2002. c. s/p cath 11/2013 with severe 3 vessel ASCAD s/p CABG with LIMA to LAD, SVG to diag, SVG to left circ and SVG to PDA. d. cath 07/07/2014 occluded SVG to PDA, all other grafts patent. 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 DJD (degenerative joint disease)   . Epistaxis   . GERD (gastroesophageal reflux disease)   . GI bleeding    a.  with benign gastric polypectomy 05/2014.  Marland Kitchen HLD (hyperlipidemia)   . HTN (hypertension)   . Hypokalemia   . Hypomagnesemia   . Impaired fasting glucose    A1C check every year  . Obesity   . OSA (obstructive sleep apnea)   . Pacemaker    a. 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 b. MDT generator replacement 2012.  Marland Kitchen PAF (paroxysmal atrial fibrillation) (HCC)    on chronic systemic anticoagulation  . PVC's (premature ventricular contractions)   . Syncope    a.  vasovagal with documented bradycardia.  . Type 2 diabetes mellitus without complications ([place]) 07/07/6379    Past Surgical History:  Procedure Laterality Date  . ANGIOPLASTY  1997  . CHOLECYSTECTOMY    . COLONOSCOPY WITH PROPOFOL N/A 05/05/2014   Procedure: COLONOSCOPY WITH PROPOFOL;  Surgeon: Garlan Fair, MD;  Location: [facility] ENDOSCOPY;  Service: Endoscopy;  Laterality: N/A;  . CORONARY ARTERY BYPASS GRAFT N/A 12/01/2013   Procedure: CORONARY ARTERY BYPASS GRAFT TIMES FOUR USING LEFT INTERNAL MAMMARY ARTERY TO LAD, SAPHENOUS VEIN GRAFTS TO DIAGONAL, CIRCUMFELX AND POSTERIOR DESCENDING;  Surgeon: Ivin Poot, MD;  Location: [facility];  Service: Open Heart Surgery;  Laterality: N/A;  . ESOPHAGOGASTRODUODENOSCOPY (EGD) WITH PROPOFOL N/A 05/05/2014   Procedure: ESOPHAGOGASTRODUODENOSCOPY (EGD) WITH PROPOFOL;  Surgeon: Garlan Fair, MD;  Location: [facility] ENDOSCOPY;  Service: Endoscopy;  Laterality: N/A;  . KNEE ARTHROSCOPY  2000   Left  . LEFT AND RIGHT HEART CATHETERIZATION WITH CORONARY/GRAFT ANGIOGRAM N/A 07/07/2014   Procedure: LEFT AND RIGHT HEART CATHETERIZATION WITH Beatrix Fetters;  Surgeon: Burnell Blanks, MD;  Location: [facility] CATH LAB;  Service: Cardiovascular;  Laterality: N/A;  . LEFT HEART CATHETERIZATION WITH CORONARY ANGIOGRAM N/A 11/25/2013   Procedure: LEFT HEART CATHETERIZATION WITH CORONARY ANGIOGRAM;  Surgeon: Sueanne Margarita, MD;  Location: [facility] CATH LAB;  Service: Cardiovascular;  Laterality: N/A;  . PACEMAKER INSERTION  2002   generator change MDT by Dr Caryl Comes in 2012  . TEE WITHOUT CARDIOVERSION N/A 08/31/2014   Procedure: TRANSESOPHAGEAL ECHOCARDIOGRAM (TEE);  Surgeon: Sueanne Margarita, MD;  Location: [facility] ENDOSCOPY;  Service: Cardiovascular;  Laterality: N/A;    There were no vitals filed for this visit.      Subjective Assessment - 02/01/17 1203    Subjective I did golf this weekend.  I have played 3 times.  3 weeks ago it was difficult to bend over and put down the  tee. Last night without the crutch I could put my tee down for the first time.   Limitations Lifting   Patient Stated Goals "this may be the last time I see you all"   Pain Score 0-No pain            [facility] PT Assessment - 02/01/17 1244      Assessment   Medical Diagnosis Lt hamstring tear   Referring Provider Dr. Leighton Ruff   Onset Date/Surgical Date 12/27/16     Strength   Right Hip Flexion 5/5   Right Hip Extension 5/5   Right Hip ABduction 5/5   Left Hip Flexion 5/5   Left Hip Extension 4/5   Left Hip ABduction 5/5   Left Hip ADduction 5/5   Right Knee Flexion 5/5   Right Knee Extension 5/5   Left Knee Flexion 4+/5  no pain   Left Knee Extension 5/5                     [facility] Adult PT Treatment/Exercise - 02/01/17 1245      Self-Care   Self-Care Other Self-Care Comments   Other Self-Care Comments  Pt instructed and retrun demo for lifting 20 lb     Knee/Hip Exercises: Stretches   Active Hamstring Stretch 3 reps;30 seconds   Active Hamstring Stretch Limitations with strap and VC and TC  more stretch than sitting.  2 x sitting HS stretch with VC for correct execution   Other Knee/Hip Stretches IT band stretch with strap on left x 3 30 sec     Knee/Hip Exercises: Standing   Knee Flexion 10 reps;Left;Strengthening   Knee Flexion Limitations with UE support for balance     Knee/Hip Exercises: Supine   Bridges 10 reps   Bridges with [employer] 10 reps   Bridges with [employer] 10 reps  green t band     Knee/Hip Exercises: Prone   Other Prone Exercises prone quad stretch with strap 30 sec x 2                     PT Long Term Goals - 02/01/17 1231      PT LONG TERM GOAL #1   Title independent with HEP   Time 6   Period Weeks   Status Achieved     PT LONG TERM GOAL #2   Title demonstrate ability to lift at least 20# from floor to bed height without increase in symptoms in order to care for elderly dog   Baseline Pt  demonstrated 20 lb in clinic,with handled box with no pain   Time 6   Period Weeks   Status Achieved     PT LONG TERM GOAL #3   Title report no pain with golfing for improved function   Baseline Pt has played 3 times golfing without pain   Time 6   Period Weeks  Status Achieved               Plan - 02/01/17 1325    Clinical Impression Statement Pt attended 2 visits and requests discharge due to no pain in left hamstring tear.  left hamstring strength now 4+/5. Pt has no pain now and is now able to golf without pain and lift 20 lb from floor to mat without exacerbating pain.  Pt is pleased with current progress and has advanced HEP to use at home   Rehab Potential Good   PT Frequency 1x / week   PT Duration 6 weeks   PT Treatment/Interventions ADLs/Self Care Home Management;Cryotherapy;Electrical Stimulation;Moist Heat;Ultrasound;Therapeutic exercise;Therapeutic activities;Functional mobility training;Stair training;Gait training;Patient/family education;Manual techniques;Taping;Dry needling   PT Next Visit Plan DC   Consulted and Agree with Plan of Care Patient      Patient will benefit from skilled therapeutic intervention in order to improve the following deficits and impairments:  Improper body mechanics, Impaired flexibility, Pain, Increased fascial restricitons, Increased muscle spasms, Decreased strength  Visit Diagnosis: Pain in left leg     Problem List Patient Active Problem List   Diagnosis Date Noted  . CAD (coronary artery disease) 12/21/2016  . Anemia 12/02/2015  . Nonrheumatic aortic valve disorder 12/02/2015  . Type 2 diabetes mellitus without complications ([place]) 84/72/0721  . Gastro-esophageal reflux disease without esophagitis 12/02/2015  . Other fecal abnormalities 12/02/2015  . Hypertension 12/02/2015  . Osteoarthritis 12/02/2015  . Dizziness and giddiness 12/02/2015  . Encounter for therapeutic drug monitoring 08/07/2014  . History of GI bleed  2/2 duodenal polyp 08/04/2014  . Hyperlipidemia 06/03/2014  . Upper airway cough syndrome 04/28/2014  . Breath shortness 04/13/2014  . Carotid atherosclerosis   . OSA (obstructive sleep apnea)   . PVC's (premature ventricular contractions)   . Hypomagnesemia   . Hypokalemia   . Chronic combined systolic and diastolic CHF (congestive heart failure) ([place])   . Obesity (BMI 30-39.9) 02/04/2014  . AS (aortic stenosis)   . Chronic anticoagulation 11/27/2013  . Hyperglycemia 11/26/2013  . PAF (paroxysmal atrial fibrillation) ([place])   . Dyslipidemia 07/08/2013  . Bradycardia s/p MDT pacemaker   . Syncope   . Recurrent coronary arteriosclerosis after percutaneous transluminal coronary angioplasty    Voncille Lo, PT Certified Exercise Expert for the Aging Adult  02/01/17 5:24 PM Phone: [phone] Fax: [facility] [address] [place], [place], [address] Phone: [phone]   Fax:  [phone]  Name: Brandon Klein MRN: [id number] Date of Birth: June 14, 1940   PHYSICAL THERAPY DISCHARGE SUMMARY  Visits from Start of Care: 2  Current functional level related to goals / functional outcomes: As above   Remaining deficits: None, just ongoing strengthening with advanced HEP and Cardiac rehab  Education / Equipment: HEP and green t band Plan: Patient agrees to discharge.  Patient goals were met. Patient is being discharged due to meeting the stated rehab goals.  ????? and patient request due to being pleased with current functional status    Voncille Lo, PT Certified Exercise Expert for the Aging Adult  02/01/17 5:24 PM Phone: [phone] Fax: [phone]

## 2017-02-02 ENCOUNTER — Encounter (HOSPITAL_COMMUNITY)
Admission: RE | Admit: 2017-02-02 | Discharge: 2017-02-02 | Disposition: A | Discharge status: Home / Self Care | Payer: Self-pay | Source: Ambulatory Visit | Attending: Cardiology | Admitting: Cardiology

## 2017-02-05 ENCOUNTER — Encounter (HOSPITAL_COMMUNITY)
Admission: RE | Admit: 2017-02-05 | Discharge: 2017-02-05 | Disposition: A | Discharge status: Home / Self Care | Payer: Self-pay | Source: Ambulatory Visit | Attending: Cardiology | Admitting: Cardiology

## 2017-02-07 ENCOUNTER — Encounter (HOSPITAL_COMMUNITY)
Admission: RE | Admit: 2017-02-07 | Discharge: 2017-02-07 | Disposition: A | Discharge status: Home / Self Care | Payer: Self-pay | Source: Ambulatory Visit | Attending: Cardiology | Admitting: Cardiology

## 2017-02-08 ENCOUNTER — Ambulatory Visit (HOSPITAL_COMMUNITY): Payer: Medicare Other | Attending: Cardiovascular Disease

## 2017-02-08 ENCOUNTER — Other Ambulatory Visit: Payer: Self-pay

## 2017-02-08 DIAGNOSIS — I350 Nonrheumatic aortic (valve) stenosis: Secondary | ICD-10-CM | POA: Diagnosis not present

## 2017-02-09 ENCOUNTER — Encounter (HOSPITAL_COMMUNITY): Payer: Self-pay

## 2017-02-12 ENCOUNTER — Encounter (HOSPITAL_COMMUNITY)
Admission: RE | Admit: 2017-02-12 | Discharge: 2017-02-12 | Disposition: A | Discharge status: Home / Self Care | Payer: Self-pay | Source: Ambulatory Visit | Attending: Cardiology | Admitting: Cardiology

## 2017-02-14 ENCOUNTER — Encounter (HOSPITAL_COMMUNITY)
Admission: RE | Admit: 2017-02-14 | Discharge: 2017-02-14 | Disposition: A | Discharge status: Home / Self Care | Payer: Self-pay | Source: Ambulatory Visit | Attending: Cardiology | Admitting: Cardiology

## 2017-02-16 ENCOUNTER — Encounter (HOSPITAL_COMMUNITY)
Admission: RE | Admit: 2017-02-16 | Discharge: 2017-02-16 | Disposition: A | Discharge status: Home / Self Care | Payer: Self-pay | Source: Ambulatory Visit | Attending: Cardiology | Admitting: Cardiology

## 2017-02-19 ENCOUNTER — Encounter (HOSPITAL_COMMUNITY)
Admission: RE | Admit: 2017-02-19 | Discharge: 2017-02-19 | Disposition: A | Discharge status: Home / Self Care | Payer: Self-pay | Source: Ambulatory Visit | Attending: Cardiology | Admitting: Cardiology

## 2017-02-21 ENCOUNTER — Encounter (HOSPITAL_COMMUNITY)
Admission: RE | Admit: 2017-02-21 | Discharge: 2017-02-21 | Disposition: A | Discharge status: Home / Self Care | Payer: Self-pay | Source: Ambulatory Visit | Attending: Cardiology | Admitting: Cardiology

## 2017-02-23 ENCOUNTER — Encounter (HOSPITAL_COMMUNITY)
Admission: RE | Admit: 2017-02-23 | Discharge: 2017-02-23 | Disposition: A | Discharge status: Home / Self Care | Payer: Self-pay | Source: Ambulatory Visit | Attending: Cardiology | Admitting: Cardiology

## 2017-02-23 ENCOUNTER — Ambulatory Visit (INDEPENDENT_AMBULATORY_CARE_PROVIDER_SITE_OTHER): Payer: Medicare Other | Admitting: *Deleted

## 2017-02-23 DIAGNOSIS — I4891 Unspecified atrial fibrillation: Secondary | ICD-10-CM

## 2017-02-23 DIAGNOSIS — I480 Paroxysmal atrial fibrillation: Secondary | ICD-10-CM

## 2017-02-23 DIAGNOSIS — Z5181 Encounter for therapeutic drug level monitoring: Secondary | ICD-10-CM

## 2017-02-23 LAB — POCT INR: INR: 2.6

## 2017-02-26 ENCOUNTER — Encounter (HOSPITAL_COMMUNITY): Payer: Self-pay

## 2017-02-26 DIAGNOSIS — Z951 Presence of aortocoronary bypass graft: Secondary | ICD-10-CM | POA: Insufficient documentation

## 2017-02-28 ENCOUNTER — Encounter (HOSPITAL_COMMUNITY): Payer: Self-pay

## 2017-03-02 ENCOUNTER — Encounter (HOSPITAL_COMMUNITY): Payer: Self-pay

## 2017-03-05 ENCOUNTER — Encounter (HOSPITAL_COMMUNITY)
Admission: RE | Admit: 2017-03-05 | Discharge: 2017-03-05 | Disposition: A | Discharge status: Home / Self Care | Payer: Self-pay | Source: Ambulatory Visit | Attending: Cardiology | Admitting: Cardiology

## 2017-03-07 ENCOUNTER — Encounter (HOSPITAL_COMMUNITY)
Admission: RE | Admit: 2017-03-07 | Discharge: 2017-03-07 | Disposition: A | Discharge status: Home / Self Care | Payer: Self-pay | Source: Ambulatory Visit | Attending: Cardiology | Admitting: Cardiology

## 2017-03-09 ENCOUNTER — Other Ambulatory Visit: Payer: Self-pay | Admitting: Family Medicine

## 2017-03-09 ENCOUNTER — Encounter (HOSPITAL_COMMUNITY)
Admission: RE | Admit: 2017-03-09 | Discharge: 2017-03-09 | Disposition: A | Discharge status: Home / Self Care | Payer: Self-pay | Source: Ambulatory Visit | Attending: Cardiology | Admitting: Cardiology

## 2017-03-09 DIAGNOSIS — R0989 Other specified symptoms and signs involving the circulatory and respiratory systems: Secondary | ICD-10-CM

## 2017-03-11 NOTE — Progress Notes (Addendum)
Cardiology Office Note Date:  03/13/2017  Patient ID:  Brandon, Klein 12-13-1940, MRN [id number] PCP:  Juluis Rainier, MD  Cardiologist:  Dr. [name] Electrophysiologist: Dr. Graciela Husbands    Chief Complaint: 1 year f/u with EP service  History of Present Illness: Brandon Klein is a 76 y.o. male with history of CAD (CABG x3 in 2015, PCI/stents prior to CABG), ICM, VHD w/mod AS, vasovagal syncope w/PPM,  PAFib, CRI, HTN, HLD, hx of GIB (2/2 polypectomy 2016).  The patient comes today to be seen for Dr. Graciela Husbands.  Last seen by him in Oct 2017, at that time his ADL rate was reduced otherwise appeared to be doing well and no other changes made.  He is doing well, denies any concerns.  He will infrequently feel his PVCs, or isolated strong beat, no palpitations, no CP or SOB, no dizziness, near syncope or syncope.  In review of his device in August he had HVR, one lasting 30beats, Though fairly slow, he mentions this correlated with the time that he tore his hamstring, was very painful few days, had significant bruising, this was managed with his PMD.  He reports having "full labs" done with his PMD last week, has not gotten hs results yet.  Outside if the hamstring injury, denies any bleeding or signs of bleeding.  He weighs himself daily and reports his weight has been very steady, waxes/wanes 1-2 pounds but no consistent gains.  Device information: MDT dual chamber PPM, implanted 02/21/01, gen change 02/01/11, Dr. Ladona Ridgel, followed by Dr. Graciela Husbands   Past Medical History:  Diagnosis Date  . AS (aortic stenosis)    mild by echo 10/2013  . Carotid artery plaque    a. Duplex 10/2013: mild calcific plaque origin ICA. Left: intimal wall thickening CCA. 0-39% BICA.  Marland Kitchen Chronic renal insufficiency   . Chronic systolic dysfunction of left ventricle   . Coronary artery disease    a. s/p PCI of LAD 1997. b. Cutting balloon angioplasty to LCx in 2002. c. s/p cath 11/2013 with severe 3 vessel ASCAD s/p CABG with  LIMA to LAD, SVG to diag, SVG to left circ and SVG to PDA. d. cath 07/07/2014 occluded SVG to PDA, all other grafts patent. 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 DJD (degenerative joint disease)   . Epistaxis   . GERD (gastroesophageal reflux disease)   . GI bleeding    a.  with benign gastric polypectomy 05/2014.  Marland Kitchen HLD (hyperlipidemia)   . HTN (hypertension)   . Hypokalemia   . Hypomagnesemia   . Impaired fasting glucose    A1C check every year  . Obesity   . OSA (obstructive sleep apnea)   . Pacemaker    a. 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 b. MDT generator replacement 2012.  Marland Kitchen PAF (paroxysmal atrial fibrillation) (HCC)    on chronic systemic anticoagulation  . PVC's (premature ventricular contractions)   . Syncope    a. vasovagal with documented bradycardia.  . Type 2 diabetes mellitus without complications (HCC) 12/02/2015    Past Surgical History:  Procedure Laterality Date  . ANGIOPLASTY  1997  . CHOLECYSTECTOMY    . COLONOSCOPY WITH PROPOFOL N/A 05/05/2014   Procedure: COLONOSCOPY WITH PROPOFOL;  Surgeon: Charolett Bumpers, MD;  Location: [facility] ENDOSCOPY;  Service: Endoscopy;  Laterality: N/A;  . CORONARY ARTERY BYPASS GRAFT N/A 12/01/2013   Procedure: CORONARY ARTERY BYPASS GRAFT TIMES FOUR USING LEFT INTERNAL MAMMARY ARTERY TO LAD, SAPHENOUS VEIN  GRAFTS TO DIAGONAL, CIRCUMFELX AND POSTERIOR DESCENDING;  Surgeon: Kerin Perna, MD;  Location: [facility] OR;  Service: Open Heart Surgery;  Laterality: N/A;  . ESOPHAGOGASTRODUODENOSCOPY (EGD) WITH PROPOFOL N/A 05/05/2014   Procedure: ESOPHAGOGASTRODUODENOSCOPY (EGD) WITH PROPOFOL;  Surgeon: Charolett Bumpers, MD;  Location: [facility] ENDOSCOPY;  Service: Endoscopy;  Laterality: N/A;  . KNEE ARTHROSCOPY  2000   Left  . LEFT AND RIGHT HEART CATHETERIZATION WITH CORONARY/GRAFT ANGIOGRAM N/A 07/07/2014   Procedure: LEFT AND RIGHT HEART CATHETERIZATION WITH Isabel Caprice;  Surgeon: Kathleene Hazel, MD;  Location: [facility] CATH  LAB;  Service: Cardiovascular;  Laterality: N/A;  . LEFT HEART CATHETERIZATION WITH CORONARY ANGIOGRAM N/A 11/25/2013   Procedure: LEFT HEART CATHETERIZATION WITH CORONARY ANGIOGRAM;  Surgeon: [name], MD;  Location: [facility] CATH LAB;  Service: Cardiovascular;  Laterality: N/A;  . PACEMAKER INSERTION  2002   generator change MDT by Dr Graciela Husbands in 2012  . TEE WITHOUT CARDIOVERSION N/A 08/31/2014   Procedure: TRANSESOPHAGEAL ECHOCARDIOGRAM (TEE);  Surgeon: [name], MD;  Location: [facility] ENDOSCOPY;  Service: Cardiovascular;  Laterality: N/A;    Current Outpatient Prescriptions  Medication Sig Dispense Refill  . aspirin EC 81 MG EC tablet Take 1 tablet (81 mg total) by mouth daily. (Patient taking differently: Take 81 mg by mouth every morning. )    . atorvastatin (LIPITOR) 20 MG tablet Take 20 mg by mouth daily at 6 PM.     . carvedilol (COREG) 25 MG tablet TAKE 1/2 TABLET BY MOUTH TWO TIMES A DAY WITH MEAL 30 tablet 11  . Cholecalciferol (VITAMIN D) 2000 UNITS tablet Take 2,000 Units by mouth daily.    . Cyanocobalamin (VITAMIN B-12 PO) Take 1 tablet by mouth every morning.    . docusate sodium (COLACE) 100 MG capsule Take 100 mg by mouth 2 (two) times daily as needed for mild constipation.    . fexofenadine (ALLEGRA) 180 MG tablet Take 180 mg by mouth every morning.     . fluticasone (FLONASE) 50 MCG/ACT nasal spray Place 2 sprays into the nose every morning.     . furosemide (LASIX) 20 MG tablet TAKE 1 TABLET BY MOUTH TWICE A DAY TAKE WITH 80 MG TO MAKE TOTAL 100 MG TOTAL 180 tablet 3  . furosemide (LASIX) 80 MG tablet TAKE 1 TABLET BY MOUTH TWICE DAILY WITH 20 MG TO MAKE A TOTAL OF 100 MG 180 tablet 3  . Glucosamine-Chondroit-Vit C-Mn (GLUCOSAMINE CHONDR 1500 COMPLX) CAPS Take 1 capsule by mouth every morning.     . hydrALAZINE (APRESOLINE) 25 MG tablet TAKE 1 TABLET(25 MG) BY MOUTH THREE TIMES DAILY 90 tablet 5  . isosorbide mononitrate (IMDUR) 30 MG 24 hr tablet TAKE 1 TABLET BY MOUTH EVERY  DAY 30 tablet 10  . L-Lysine 500 MG CAPS Take 1 capsule by mouth every morning.     . meclizine (ANTIVERT) 25 MG tablet Take 25 mg by mouth daily as needed for dizziness.     . Multiple Vitamin (MULTIVITAMIN) tablet Take 1 tablet by mouth every morning.     . nitroGLYCERIN (NITROSTAT) 0.4 MG SL tablet Place 1 tablet (0.4 mg total) under the tongue every 5 (five) minutes as needed for chest pain. (Patient taking differently: Place 0.4 mg under the tongue every 5 (five) minutes as needed for chest pain (MAX 3 TABLETS). ) 30 tablet 3  . ONE TOUCH ULTRA TEST test strip 1 each by Other route every morning.     Letta Pate DELICA LANCETS FINE  MISC 1 each by Other route every morning.     . pantoprazole (PROTONIX) 40 MG tablet Take 40 mg by mouth every morning.     . potassium chloride SA (K-DUR,KLOR-CON) 20 MEQ tablet TAKE 2 TABLETS BY MOUTH TWICE DAILY 360 tablet 3  . RA NATURAL MAGNESIUM 250 MG TABS TAKE 2 TABLETS TWICE DAILY. 120 tablet 5  . spironolactone (ALDACTONE) 25 MG tablet TAKE 1 TABLET(25 MG) BY MOUTH DAILY 90 tablet 1  . warfarin (COUMADIN) 5 MG tablet TAKE AS DIRECTED BY COUMADIN CLINIC 135 tablet 1   No current facility-administered medications for this visit.     Allergies:   Acetaminophen-codeine; Amoxicillin; and Diovan [valsartan]   Social History:  The patient  reports that he quit smoking about 50 years ago. His smoking use included Cigarettes. He has a 10.00 pack-year smoking history. He has never used smokeless tobacco. He reports that he drinks alcohol. He reports that he does not use drugs.   Family History:  The patient's family history includes Colon cancer in his mother; Heart attack in his brother and mother; Leukemia in his father.  ROS:  Please see the history of present illness.  All other systems are reviewed and otherwise negative.   PHYSICAL EXAM:  VS:  BP 111/69   Pulse 84   Ht  (1.702 m)   Wt 247 lb (112 kg)   BMI 38.69 kg/m  BMI: Body mass index is  38.69 kg/m. Well nourished, well developed, in no acute distress  HEENT: normocephalic, atraumatic  Neck: no JVD, carotid bruits or masses Cardiac:  RRR; no significant murmurs, no rubs, or gallops Lungs:  CTA b/l, no wheezing, rhonchi or rales  Abd: soft, nontender MS: no deformity or atrophy Ext: no edema  Skin: warm and dry, no rash Neuro:  No gross deficits appreciated Psych: euthymic mood, full affect  PPM site: stable, no tethering, skin change, edema or tenderness   EKG:  Not done today PPM interrogation done today and reviewed by myself: battery and lead measurements are good. 5 HVR, longest 30 beats, looks like had capture beats within, generally 150bpm, others 2-4 seconds (seen for him historically), none since august, no AF   02/08/17: TTE Study Conclusions - Left ventricle: Abnormal septal motion mid and basal inferior   wall hypokinesis. Systolic function was mildly reduced. The   estimated ejection fraction was in the range of 45% to 50%.   Doppler parameters are consistent with abnormal left ventricular   relaxation (grade 1 diastolic dysfunction). - Aortic valve: Gradients actually appear lower than those recorded   on 02/07/16. There was moderate stenosis. Valve area (VTI): 0.74   cm^2. Valve area (Vmax): 0.7 cm^2. Valve area (Vmean): 0.74 cm^2. - Left atrium: The atrium was moderately dilated. - Atrial septum: No defect or patent foramen ovale was identified.  02/07/16: TTE, LVEF 40% 02/23/15: TTE, LVEF 35-40% 09/28/14: TTE, LVEF 25-30%, probable severe AS, dobutamine done described as moderate  07/07/14: LHC Impression: 1. 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 Moderately severe distal RCA stenosis, this vessel is no longer protected by the graft 3. Ischemic cardiomyopathy 4. Moderate aortic stenosis although severity may be underestimated given low LVEF Recommendations: He appears to remain volume overloaded. I would resume  diuresis today. I cannot explain the drop in his LVEF by the occlusion of the graft to the PDA. His native RCA has moderately severe distal disease but good flow. This could be  easily approached with PCI in the future. For now, would not recommend PCI of the RCA.   Recent Labs: No results found for requested labs within last 8760 hours.  No results found for requested labs within last 8760 hours.   CrCl cannot be calculated (Patient's most recent lab result is older than the maximum 21 days allowed.).   Wt Readings from Last 3 Encounters:  03/13/17 247 lb (112 kg)  12/21/16 245 lb 12.8 oz (111.5 kg)  06/07/16 244 lb 12.8 oz (111 kg)     Other studies reviewed: Additional studies/records reviewed today include: summarized above  ASSESSMENT AND PLAN:  1. PPM     Intact function  2. CAD      No anginal complaints     On ASA, statin, BB     C/w Dr.Turner  3. ICM, LVEF has improved over the years     Exam appears euvolemic, no symptoms of fluid OL     pt weighs daily, reports stable     On BB, diuretic tx, no ACE/ARB, has hx of CRI, LVEF improved to 45-50%  4. Paroxysmal Afib     CHA2DS2Vasc is at least 5, on warfarin     Monitored with the coumadin clinic  5. HTN     Looks OK, no changes  6. 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 [name], given VT episode in August and his CAD felt he may benefit from updated ischemic evaluation, though he has not had any symptoms to suggest progression.  We will increase his coreg to  BID and schedule for lexiscan stress test and follow up in a month, sooner if needed.     Disposition: as above, we have requested last week's labs from his PMD office   Current medicines are reviewed at length with the patient today.  The  patient did not have any concerns regarding medicines.  Norma Fredrickson, PA-C 03/13/2017 3:09 PM     [facility] [address] [place] [address] [phone] (office)  [phone] (fax)

## 2017-03-12 ENCOUNTER — Encounter (HOSPITAL_COMMUNITY)
Admission: RE | Admit: 2017-03-12 | Discharge: 2017-03-12 | Disposition: A | Discharge status: Home / Self Care | Payer: Self-pay | Source: Ambulatory Visit | Attending: Cardiology | Admitting: Cardiology

## 2017-03-13 ENCOUNTER — Ambulatory Visit (INDEPENDENT_AMBULATORY_CARE_PROVIDER_SITE_OTHER): Payer: Medicare Other | Admitting: Physician Assistant

## 2017-03-13 DIAGNOSIS — Z950 Presence of cardiac pacemaker: Secondary | ICD-10-CM | POA: Diagnosis not present

## 2017-03-13 DIAGNOSIS — I472 Ventricular tachycardia: Secondary | ICD-10-CM | POA: Diagnosis not present

## 2017-03-13 DIAGNOSIS — I2510 Atherosclerotic heart disease of native coronary artery without angina pectoris: Secondary | ICD-10-CM | POA: Diagnosis not present

## 2017-03-13 DIAGNOSIS — I10 Essential (primary) hypertension: Secondary | ICD-10-CM | POA: Diagnosis not present

## 2017-03-13 DIAGNOSIS — I480 Paroxysmal atrial fibrillation: Secondary | ICD-10-CM

## 2017-03-13 DIAGNOSIS — I255 Ischemic cardiomyopathy: Secondary | ICD-10-CM

## 2017-03-13 DIAGNOSIS — I4729 Other ventricular tachycardia: Secondary | ICD-10-CM

## 2017-03-13 NOTE — Patient Instructions (Signed)
Medication Instructions:   Your physician recommends that you continue on your current medications as directed. Please refer to the Current Medication list given to you today.  If you need a refill on your cardiac medications before your next appointment, please call your pharmacy.  Labwork: NONE ORDERED  TODAY    Testing/Procedures: NONE ORDERED  TODAY    Follow-Up: IN 3 MONTHS WITH RENEE URSUY   Any Other Special Instructions Will Be Listed Below (If Applicable).                                                                                                                                                   

## 2017-03-14 ENCOUNTER — Encounter (HOSPITAL_COMMUNITY)
Admission: RE | Admit: 2017-03-14 | Discharge: 2017-03-14 | Disposition: A | Discharge status: Home / Self Care | Payer: Self-pay | Source: Ambulatory Visit | Attending: Cardiology | Admitting: Cardiology

## 2017-03-16 ENCOUNTER — Encounter (HOSPITAL_COMMUNITY)
Admission: RE | Admit: 2017-03-16 | Discharge: 2017-03-16 | Disposition: A | Discharge status: Home / Self Care | Payer: Self-pay | Source: Ambulatory Visit | Attending: Cardiology | Admitting: Cardiology

## 2017-03-19 ENCOUNTER — Encounter (HOSPITAL_COMMUNITY)
Admission: RE | Admit: 2017-03-19 | Discharge: 2017-03-19 | Disposition: A | Discharge status: Home / Self Care | Payer: Self-pay | Source: Ambulatory Visit | Attending: Cardiology | Admitting: Cardiology

## 2017-03-19 ENCOUNTER — Telehealth: Payer: Self-pay | Admitting: *Deleted

## 2017-03-19 DIAGNOSIS — I472 Ventricular tachycardia, unspecified: Secondary | ICD-10-CM

## 2017-03-19 MED ORDER — CARVEDILOL 25 MG PO TABS: 25.0000 mg | ORAL_TABLET | Freq: Two times a day (BID) | ORAL | 2 refills | Status: DC

## 2017-03-19 NOTE — Telephone Encounter (Signed)
Called patient per [name], PA request. Informed patient to increase Carvedilol to 25 mg BID. Advised that Dr. Alvia Grove would like him to have a Lexi test to further evaluate possible reason for long VT episode/s.  He is agreeable and understands office will call him to arrange. Informed that Luster Landsberg would like to see him back in the office in one month.  He understands I will call him (or Melissa, scheduler) once Lexi scheduled and we will arrange for OV afterwards. Patient verbalized understanding and agreeable to plan.

## 2017-03-20 ENCOUNTER — Telehealth (HOSPITAL_COMMUNITY): Payer: Self-pay | Admitting: *Deleted

## 2017-03-20 NOTE — Telephone Encounter (Signed)
Patient given detailed instructions per Myocardial Perfusion Study Information Sheet for the test on 03/23/17. Patient notified to arrive 15 minutes early and that it is imperative to arrive on time for appointment to keep from having the test rescheduled.  If you need to cancel or reschedule your appointment, please call the office within 24 hours of your appointment. . Patient verbalized understanding. Brandon Klein    

## 2017-03-21 ENCOUNTER — Encounter (HOSPITAL_COMMUNITY)
Admission: RE | Admit: 2017-03-21 | Discharge: 2017-03-21 | Disposition: A | Discharge status: Home / Self Care | Payer: Self-pay | Source: Ambulatory Visit | Attending: Cardiology | Admitting: Cardiology

## 2017-03-23 ENCOUNTER — Ambulatory Visit (HOSPITAL_COMMUNITY): Payer: Medicare Other | Attending: Cardiovascular Disease

## 2017-03-23 ENCOUNTER — Encounter (HOSPITAL_COMMUNITY): Payer: Self-pay

## 2017-03-23 DIAGNOSIS — I472 Ventricular tachycardia, unspecified: Secondary | ICD-10-CM

## 2017-03-23 LAB — MYOCARDIAL PERFUSION IMAGING
CHL CUP NUCLEAR SRS: 2
CHL CUP RESTING HR STRESS: 68 {beats}/min
CSEPPHR: 74 {beats}/min
LV dias vol: 127 mL (ref 62–150)
LV sys vol: 74 mL
RATE: 0.32
SDS: 2
SSS: 7
TID: 1.06

## 2017-03-23 MED ORDER — TECHNETIUM TC 99M TETROFOSMIN IV KIT
10.7000 | PACK | Freq: Once | INTRAVENOUS | Status: AC | PRN
  Administered 2017-03-23: 10.7 via INTRAVENOUS
  Filled 2017-03-23: qty 11

## 2017-03-23 MED ORDER — REGADENOSON 0.4 MG/5ML IV SOLN
0.4000 mg | Freq: Once | INTRAVENOUS | Status: AC
  Administered 2017-03-23: 0.4 mg via INTRAVENOUS

## 2017-03-23 MED ORDER — TECHNETIUM TC 99M TETROFOSMIN IV KIT
31.5000 | PACK | Freq: Once | INTRAVENOUS | Status: AC | PRN
  Administered 2017-03-23: 31.5 via INTRAVENOUS
  Filled 2017-03-23: qty 32

## 2017-03-26 ENCOUNTER — Encounter (HOSPITAL_COMMUNITY)
Admission: RE | Admit: 2017-03-26 | Discharge: 2017-03-26 | Disposition: A | Discharge status: Home / Self Care | Payer: Self-pay | Source: Ambulatory Visit | Attending: Cardiology | Admitting: Cardiology

## 2017-03-27 ENCOUNTER — Telehealth: Payer: Self-pay | Admitting: *Deleted

## 2017-03-27 MED ORDER — CARVEDILOL 25 MG PO TABS: 25.0000 mg | ORAL_TABLET | Freq: Two times a day (BID) | ORAL | 2 refills | Status: DC

## 2017-03-27 NOTE — Telephone Encounter (Signed)
SPOKE TO PT AND PT IS AWARE OF NO MEDICATIONS CHANGES. PT ASKED FOR 90 DAY REFILL  ON CARVEDILOL

## 2017-03-28 ENCOUNTER — Encounter (HOSPITAL_COMMUNITY)
Admission: RE | Admit: 2017-03-28 | Discharge: 2017-03-28 | Disposition: A | Discharge status: Home / Self Care | Payer: Self-pay | Source: Ambulatory Visit | Attending: Cardiology | Admitting: Cardiology

## 2017-03-29 ENCOUNTER — Encounter: Payer: Self-pay | Admitting: Cardiology

## 2017-03-30 ENCOUNTER — Encounter (HOSPITAL_COMMUNITY)
Admission: RE | Admit: 2017-03-30 | Discharge: 2017-03-30 | Disposition: A | Discharge status: Home / Self Care | Payer: Self-pay | Source: Ambulatory Visit | Attending: Family Medicine | Admitting: Family Medicine

## 2017-03-30 DIAGNOSIS — Z951 Presence of aortocoronary bypass graft: Secondary | ICD-10-CM | POA: Insufficient documentation

## 2017-04-02 ENCOUNTER — Encounter (HOSPITAL_COMMUNITY)
Admission: RE | Admit: 2017-04-02 | Discharge: 2017-04-02 | Disposition: A | Discharge status: Home / Self Care | Payer: Self-pay | Source: Ambulatory Visit | Attending: Cardiology | Admitting: Cardiology

## 2017-04-04 ENCOUNTER — Encounter (HOSPITAL_COMMUNITY)
Admission: RE | Admit: 2017-04-04 | Discharge: 2017-04-04 | Disposition: A | Discharge status: Home / Self Care | Payer: Self-pay | Source: Ambulatory Visit | Attending: Cardiology | Admitting: Cardiology

## 2017-04-06 ENCOUNTER — Encounter (HOSPITAL_COMMUNITY)
Admission: RE | Admit: 2017-04-06 | Discharge: 2017-04-06 | Disposition: A | Discharge status: Home / Self Care | Payer: Self-pay | Source: Ambulatory Visit | Attending: Cardiology | Admitting: Cardiology

## 2017-04-06 ENCOUNTER — Ambulatory Visit (INDEPENDENT_AMBULATORY_CARE_PROVIDER_SITE_OTHER): Payer: Medicare Other | Admitting: *Deleted

## 2017-04-06 DIAGNOSIS — I480 Paroxysmal atrial fibrillation: Secondary | ICD-10-CM

## 2017-04-06 DIAGNOSIS — I4891 Unspecified atrial fibrillation: Secondary | ICD-10-CM

## 2017-04-06 DIAGNOSIS — Z5181 Encounter for therapeutic drug level monitoring: Secondary | ICD-10-CM | POA: Diagnosis not present

## 2017-04-06 LAB — POCT INR: INR: 2.6

## 2017-04-06 NOTE — Progress Notes (Signed)
Continue taking on same dosage 1.5 tablets every day except 1 tablet on Tuesdays Thursdays.  Recheck INR in  6 weeks.

## 2017-04-09 ENCOUNTER — Telehealth: Payer: Self-pay

## 2017-04-09 ENCOUNTER — Encounter (HOSPITAL_COMMUNITY)
Admission: RE | Admit: 2017-04-09 | Discharge: 2017-04-09 | Disposition: A | Discharge status: Home / Self Care | Payer: Self-pay | Source: Ambulatory Visit | Attending: Cardiology | Admitting: Cardiology

## 2017-04-09 DIAGNOSIS — E785 Hyperlipidemia, unspecified: Secondary | ICD-10-CM

## 2017-04-09 MED ORDER — ATORVASTATIN CALCIUM 40 MG PO TABS: 40.0000 mg | ORAL_TABLET | Freq: Every day | ORAL | 3 refills | Status: DC

## 2017-04-09 NOTE — Telephone Encounter (Signed)
Patient made aware of results. Patient instructed to increase lipitor to 40 mg daily. Patient is scheduled for FLP and ALT on 12/27. Patient in agreement with plan and thanked me for the call.

## 2017-04-09 NOTE — Telephone Encounter (Signed)
-----   Message from [name], MD sent at 04/08/2017  8:43 PM EST ----- TAGs too high - increase lipitor to 40mg  daily and repeat FLP and ALt in 6 weeks

## 2017-04-11 ENCOUNTER — Encounter (HOSPITAL_COMMUNITY)
Admission: RE | Admit: 2017-04-11 | Discharge: 2017-04-11 | Disposition: A | Discharge status: Home / Self Care | Payer: Self-pay | Source: Ambulatory Visit | Attending: Cardiology | Admitting: Cardiology

## 2017-04-13 ENCOUNTER — Encounter (HOSPITAL_COMMUNITY)
Admission: RE | Admit: 2017-04-13 | Discharge: 2017-04-13 | Disposition: A | Discharge status: Home / Self Care | Payer: Self-pay | Source: Ambulatory Visit | Attending: Cardiology | Admitting: Cardiology

## 2017-04-16 ENCOUNTER — Encounter (HOSPITAL_COMMUNITY)
Admission: RE | Admit: 2017-04-16 | Discharge: 2017-04-16 | Disposition: A | Discharge status: Home / Self Care | Payer: Self-pay | Source: Ambulatory Visit | Attending: Cardiology | Admitting: Cardiology

## 2017-04-18 ENCOUNTER — Encounter (HOSPITAL_COMMUNITY)
Admission: RE | Admit: 2017-04-18 | Discharge: 2017-04-18 | Disposition: A | Discharge status: Home / Self Care | Payer: Self-pay | Source: Ambulatory Visit | Attending: Cardiology | Admitting: Cardiology

## 2017-04-23 ENCOUNTER — Encounter (HOSPITAL_COMMUNITY)
Admission: RE | Admit: 2017-04-23 | Discharge: 2017-04-23 | Disposition: A | Discharge status: Home / Self Care | Payer: Self-pay | Source: Ambulatory Visit | Attending: Cardiology | Admitting: Cardiology

## 2017-04-25 ENCOUNTER — Ambulatory Visit (INDEPENDENT_AMBULATORY_CARE_PROVIDER_SITE_OTHER): Payer: Medicare Other | Admitting: *Deleted

## 2017-04-25 ENCOUNTER — Encounter (HOSPITAL_COMMUNITY)
Admission: RE | Admit: 2017-04-25 | Discharge: 2017-04-25 | Disposition: A | Discharge status: Home / Self Care | Payer: Self-pay | Source: Ambulatory Visit | Attending: Cardiology | Admitting: Cardiology

## 2017-04-25 DIAGNOSIS — I495 Sick sinus syndrome: Secondary | ICD-10-CM | POA: Diagnosis not present

## 2017-04-25 NOTE — Progress Notes (Signed)
Remote pacemaker transmission.   

## 2017-04-27 ENCOUNTER — Encounter (HOSPITAL_COMMUNITY)
Admission: RE | Admit: 2017-04-27 | Discharge: 2017-04-27 | Disposition: A | Discharge status: Home / Self Care | Payer: Self-pay | Source: Ambulatory Visit | Attending: Cardiology | Admitting: Cardiology

## 2017-04-27 ENCOUNTER — Encounter: Payer: Self-pay | Admitting: Cardiology

## 2017-04-30 ENCOUNTER — Encounter (HOSPITAL_COMMUNITY)
Admission: RE | Admit: 2017-04-30 | Discharge: 2017-04-30 | Disposition: A | Discharge status: Home / Self Care | Payer: Self-pay | Source: Ambulatory Visit | Attending: Cardiology | Admitting: Cardiology

## 2017-04-30 DIAGNOSIS — Z951 Presence of aortocoronary bypass graft: Secondary | ICD-10-CM | POA: Insufficient documentation

## 2017-04-30 LAB — CUP PACEART REMOTE DEVICE CHECK
Battery Impedance: 379 Ohm
Battery Remaining Longevity: 106 mo
Brady Statistic AP VP Percent: 0 %
Brady Statistic AS VP Percent: 0 %
Brady Statistic AS VS Percent: 46 %
Implantable Lead Implant Date: 20020926
Implantable Lead Location: 753860
Implantable Lead Model: 5076
Implantable Lead Model: 5092
Implantable Pulse Generator Implant Date: 20120905
Lead Channel Impedance Value: 526 Ohm
Lead Channel Pacing Threshold Amplitude: 0.625 V
Lead Channel Pacing Threshold Pulse Width: 0.4 ms
Lead Channel Sensing Intrinsic Amplitude: 1.4 mV
Lead Channel Sensing Intrinsic Amplitude: 16 mV
Lead Channel Setting Pacing Pulse Width: 0.4 ms
MDC IDC LEAD IMPLANT DT: 20020926
MDC IDC LEAD LOCATION: 753859
MDC IDC MSMT BATTERY VOLTAGE: 2.77 V
MDC IDC MSMT LEADCHNL RV IMPEDANCE VALUE: 947 Ohm
MDC IDC MSMT LEADCHNL RV PACING THRESHOLD AMPLITUDE: 1 V
MDC IDC MSMT LEADCHNL RV PACING THRESHOLD PULSEWIDTH: 0.4 ms
MDC IDC SESS DTM: 20181128161118
MDC IDC SET LEADCHNL RA PACING AMPLITUDE: 2 V
MDC IDC SET LEADCHNL RV PACING AMPLITUDE: 2.5 V
MDC IDC SET LEADCHNL RV SENSING SENSITIVITY: 5.6 mV
MDC IDC STAT BRADY AP VS PERCENT: 54 %

## 2017-05-02 ENCOUNTER — Encounter (HOSPITAL_COMMUNITY)
Admission: RE | Admit: 2017-05-02 | Discharge: 2017-05-02 | Disposition: A | Discharge status: Home / Self Care | Payer: Self-pay | Source: Ambulatory Visit | Attending: Cardiology | Admitting: Cardiology

## 2017-05-04 ENCOUNTER — Encounter (HOSPITAL_COMMUNITY)
Admission: RE | Admit: 2017-05-04 | Discharge: 2017-05-04 | Disposition: A | Discharge status: Home / Self Care | Payer: Medicare Other | Source: Ambulatory Visit | Attending: Cardiology | Admitting: Cardiology

## 2017-05-07 ENCOUNTER — Encounter (HOSPITAL_COMMUNITY): Payer: Self-pay

## 2017-05-09 ENCOUNTER — Encounter (HOSPITAL_COMMUNITY)
Admission: RE | Admit: 2017-05-09 | Discharge: 2017-05-09 | Disposition: A | Discharge status: Home / Self Care | Payer: Self-pay | Source: Ambulatory Visit | Attending: Cardiology | Admitting: Cardiology

## 2017-05-11 ENCOUNTER — Encounter (HOSPITAL_COMMUNITY)
Admission: RE | Admit: 2017-05-11 | Discharge: 2017-05-11 | Disposition: A | Discharge status: Home / Self Care | Payer: Medicare Other | Source: Ambulatory Visit | Attending: Cardiology | Admitting: Cardiology

## 2017-05-14 ENCOUNTER — Encounter (HOSPITAL_COMMUNITY)
Admission: RE | Admit: 2017-05-14 | Discharge: 2017-05-14 | Disposition: A | Discharge status: Home / Self Care | Payer: Self-pay | Source: Ambulatory Visit | Attending: Cardiology | Admitting: Cardiology

## 2017-05-15 ENCOUNTER — Other Ambulatory Visit: Payer: Self-pay | Admitting: Cardiology

## 2017-05-16 ENCOUNTER — Encounter (HOSPITAL_COMMUNITY)
Admission: RE | Admit: 2017-05-16 | Discharge: 2017-05-16 | Disposition: A | Discharge status: Home / Self Care | Payer: Medicare Other | Source: Ambulatory Visit | Attending: Cardiology | Admitting: Cardiology

## 2017-05-18 ENCOUNTER — Ambulatory Visit (INDEPENDENT_AMBULATORY_CARE_PROVIDER_SITE_OTHER): Payer: Medicare Other | Admitting: *Deleted

## 2017-05-18 ENCOUNTER — Encounter (HOSPITAL_COMMUNITY)
Admission: RE | Admit: 2017-05-18 | Discharge: 2017-05-18 | Disposition: A | Discharge status: Home / Self Care | Payer: Self-pay | Source: Ambulatory Visit | Attending: Cardiology | Admitting: Cardiology

## 2017-05-18 DIAGNOSIS — I480 Paroxysmal atrial fibrillation: Secondary | ICD-10-CM

## 2017-05-18 DIAGNOSIS — I4891 Unspecified atrial fibrillation: Secondary | ICD-10-CM | POA: Diagnosis not present

## 2017-05-18 DIAGNOSIS — Z5181 Encounter for therapeutic drug level monitoring: Secondary | ICD-10-CM

## 2017-05-18 LAB — POCT INR: INR: 2.3

## 2017-05-18 NOTE — Patient Instructions (Signed)
Description   Continue taking on same dosage 1.5 tablets every day except 1 tablet on Tuesdays and  Thursdays.  Recheck INR in 6 weeks.       

## 2017-05-23 ENCOUNTER — Encounter (HOSPITAL_COMMUNITY)
Admission: RE | Admit: 2017-05-23 | Discharge: 2017-05-23 | Disposition: A | Discharge status: Home / Self Care | Payer: Medicare Other | Source: Ambulatory Visit | Attending: Cardiology | Admitting: Cardiology

## 2017-05-23 DIAGNOSIS — M1990 Unspecified osteoarthritis, unspecified site: Secondary | ICD-10-CM | POA: Insufficient documentation

## 2017-05-23 DIAGNOSIS — K219 Gastro-esophageal reflux disease without esophagitis: Secondary | ICD-10-CM | POA: Insufficient documentation

## 2017-05-23 DIAGNOSIS — K922 Gastrointestinal hemorrhage, unspecified: Secondary | ICD-10-CM | POA: Insufficient documentation

## 2017-05-23 DIAGNOSIS — N189 Chronic kidney disease, unspecified: Secondary | ICD-10-CM | POA: Insufficient documentation

## 2017-05-23 DIAGNOSIS — R7301 Impaired fasting glucose: Secondary | ICD-10-CM | POA: Insufficient documentation

## 2017-05-23 DIAGNOSIS — R040 Epistaxis: Secondary | ICD-10-CM | POA: Insufficient documentation

## 2017-05-23 DIAGNOSIS — E669 Obesity, unspecified: Secondary | ICD-10-CM | POA: Insufficient documentation

## 2017-05-23 DIAGNOSIS — Z950 Presence of cardiac pacemaker: Secondary | ICD-10-CM | POA: Insufficient documentation

## 2017-05-23 DIAGNOSIS — N289 Disorder of kidney and ureter, unspecified: Secondary | ICD-10-CM | POA: Insufficient documentation

## 2017-05-24 ENCOUNTER — Other Ambulatory Visit: Payer: Medicare Other | Admitting: *Deleted

## 2017-05-24 DIAGNOSIS — E785 Hyperlipidemia, unspecified: Secondary | ICD-10-CM

## 2017-05-24 LAB — LIPID PANEL
CHOLESTEROL TOTAL: 123 mg/dL (ref 100–199)
Chol/HDL Ratio: 3.3 ratio (ref 0.0–5.0)
HDL: 37 mg/dL — ABNORMAL LOW (ref >39)
LDL Calculated: 37 mg/dL (ref 0–99)
Triglycerides: 246 mg/dL — ABNORMAL HIGH (ref 0–149)
VLDL Cholesterol Cal: 49 mg/dL — ABNORMAL HIGH (ref 5–40)

## 2017-05-25 ENCOUNTER — Telehealth: Payer: Self-pay | Admitting: Cardiology

## 2017-05-25 ENCOUNTER — Telehealth: Payer: Self-pay

## 2017-05-25 ENCOUNTER — Encounter (HOSPITAL_COMMUNITY)
Admission: RE | Admit: 2017-05-25 | Discharge: 2017-05-25 | Disposition: A | Discharge status: Home / Self Care | Payer: Self-pay | Source: Ambulatory Visit | Attending: Cardiology | Admitting: Cardiology

## 2017-05-25 DIAGNOSIS — E785 Hyperlipidemia, unspecified: Secondary | ICD-10-CM

## 2017-05-25 MED ORDER — ATORVASTATIN CALCIUM 80 MG PO TABS: 80.0000 mg | ORAL_TABLET | Freq: Every day | ORAL | 3 refills | Status: DC

## 2017-05-25 NOTE — Telephone Encounter (Signed)
New Message  Patient does not want to do what him and Renee discussed

## 2017-05-25 NOTE — Telephone Encounter (Signed)
Returned call to patient. He states he would like to hold off on increasing Lipitor until his follow up appointment with Luster Landsberg, PA on 06/12/17 and Dr. [name] on 06/26/17. Explain to patient that he should maintain a low carb diet due to his triglyceride levels and his history of CAD. Pt states he is aware of risk related to refusing dose. Informed patient I would forward to Dr. [name]. Patient verbalized understanding and thanked me for the call.

## 2017-05-25 NOTE — Telephone Encounter (Signed)
Patient made aware of lab results. He was instructed to increase lipitor to 80 mg once a day and scheduled for repeat fasting lipids and ALT on 07/10/17. Patient in agreement with plan, verbalized understanding and thanked me for the call.  Notes recorded by [name], MD on 05/24/2017 at 11:34 PM EST Triglycerides are elevated increase Lipitor to 80mg  daily and repeat FLP and ALT in 6 weeks

## 2017-05-28 ENCOUNTER — Encounter (HOSPITAL_COMMUNITY)
Admission: RE | Admit: 2017-05-28 | Discharge: 2017-05-28 | Disposition: A | Discharge status: Home / Self Care | Payer: Self-pay | Source: Ambulatory Visit | Attending: Cardiology | Admitting: Cardiology

## 2017-05-30 ENCOUNTER — Encounter (HOSPITAL_COMMUNITY)
Admission: RE | Admit: 2017-05-30 | Discharge: 2017-05-30 | Disposition: A | Discharge status: Home / Self Care | Payer: Self-pay | Source: Ambulatory Visit | Attending: Cardiology | Admitting: Cardiology

## 2017-05-30 DIAGNOSIS — Z951 Presence of aortocoronary bypass graft: Secondary | ICD-10-CM | POA: Insufficient documentation

## 2017-06-01 ENCOUNTER — Encounter (HOSPITAL_COMMUNITY)
Admission: RE | Admit: 2017-06-01 | Discharge: 2017-06-01 | Disposition: A | Discharge status: Home / Self Care | Payer: Self-pay | Source: Ambulatory Visit | Attending: Cardiology | Admitting: Cardiology

## 2017-06-04 ENCOUNTER — Encounter (HOSPITAL_COMMUNITY)
Admission: RE | Admit: 2017-06-04 | Discharge: 2017-06-04 | Disposition: A | Discharge status: Home / Self Care | Payer: Self-pay | Source: Ambulatory Visit | Attending: Cardiology | Admitting: Cardiology

## 2017-06-06 ENCOUNTER — Encounter (HOSPITAL_COMMUNITY)
Admission: RE | Admit: 2017-06-06 | Discharge: 2017-06-06 | Disposition: A | Discharge status: Home / Self Care | Payer: Self-pay | Source: Ambulatory Visit | Attending: Cardiology | Admitting: Cardiology

## 2017-06-08 ENCOUNTER — Encounter (HOSPITAL_COMMUNITY)
Admission: RE | Admit: 2017-06-08 | Discharge: 2017-06-08 | Disposition: A | Discharge status: Home / Self Care | Payer: Self-pay | Source: Ambulatory Visit | Attending: Cardiology | Admitting: Cardiology

## 2017-06-10 NOTE — Progress Notes (Signed)
Cardiology Office Note Date:  06/12/2017  Patient ID:  Brandon Klein [birth year], MRN [id number] PCP:  Juluis Rainier, MD  Cardiologist:  Dr. [name] Electrophysiologist: Dr. Graciela Husbands    Chief Complaint:  F/u NSVT  History of Present Illness: Brandon Klein is a 77 y.o. male with history of CAD (CABG x3 in 2015, PCI/stents prior to CABG), ICM, VHD w/mod AS, vasovagal syncope w/PPM,  PAFib, CRI, HTN, HLD, hx of GIB (2/2 polypectomy 2016).  The patient comes today to be seen for Dr. Graciela Husbands.  Last seen by him in Oct 2017, at that time his ADL rate was reduced otherwise appeared to be doing well and no other changes made.  He was seen by myself in October 2018 doing well, denied any concerns.  He mentioned infrequently feeling his PVCs, or isolated strong beat, no palpitations, no CP or SOB, no dizziness, near syncope or syncope.  In review of his device in August he had HVR, one lasting 30beats, Though fairly slow, he mentioned this correlated with the time that he tore his hamstring, was very painful few days, had significant bruising, this was managed with his PMD.  He reported having "full labs" done with his PMDthe week prior to his visit, has not gotten hs results yet.  Outside if the hamstring injury, denied any bleeding or signs of bleeding.  He reported weighing himself daily and reports his weight had been very steady, waxes/wanes 1-2 pounds but no consistent gains.  I reviewed the case with Dr. [name] given NSVT (asymptomatic), we would increase his coreg pursue ischemic w/u. Stress was low risk. Labs were received from his PMD in Oct K+ was wnl, no Mag  He continues with cardiac rehab, feels great, in fact says at/after cardiac rehab is the best he feels all day, otherwise somewhat plagued with joint aches/pains, no CP.  After his visit he was stressed about having a stres test and had felt some palpitations, though as soon as it was done and reported as OK these resolved, and is fairly  sure was anxiety.  No dizziness, near syncope or syncope.  He denies bleeding or signs of bleeding.  Device information: MDT dual chamber PPM, implanted 02/21/01, gen change 02/01/11, Dr. Ladona Ridgel, followed by Dr. Graciela Husbands   Past Medical History:  Diagnosis Date  . AS (aortic stenosis)    mild by echo 10/2013  . Carotid artery plaque    a. Duplex 10/2013: mild calcific plaque origin ICA. Left: intimal wall thickening CCA. 0-39% BICA.  Marland Kitchen Chronic renal insufficiency   . Chronic systolic dysfunction of left ventricle   . Coronary artery disease    a. s/p PCI of LAD 1997. b. Cutting balloon angioplasty to LCx in 2002. c. s/p cath 11/2013 with severe 3 vessel ASCAD s/p CABG with LIMA to LAD, SVG to diag, SVG to left circ and SVG to PDA. d. cath 07/07/2014 occluded SVG to PDA, all other grafts patent. 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 DJD (degenerative joint disease)   . Epistaxis   . GERD (gastroesophageal reflux disease)   . GI bleeding    a.  with benign gastric polypectomy 05/2014.  Marland Kitchen HLD (hyperlipidemia)   . HTN (hypertension)   . Hypokalemia   . Hypomagnesemia   . Impaired fasting glucose    A1C check every year  . Obesity   . OSA (obstructive sleep apnea)   . Pacemaker    a. s/p pacemaker in 2002 for vasovagal syncope with  bradycardia. b. MDT generator replacement 2012.  Marland Kitchen PAF (paroxysmal atrial fibrillation) (HCC)    on chronic systemic anticoagulation  . PVC's (premature ventricular contractions)   . Syncope    a. vasovagal with documented bradycardia.  . Type 2 diabetes mellitus without complications (HCC) 12/02/2015    Past Surgical History:  Procedure Laterality Date  . ANGIOPLASTY  1997  . CHOLECYSTECTOMY    . COLONOSCOPY WITH PROPOFOL N/A 05/05/2014   Procedure: COLONOSCOPY WITH PROPOFOL;  Surgeon: Charolett Bumpers, MD;  Location: [facility] ENDOSCOPY;  Service: Endoscopy;  Laterality: N/A;  . CORONARY ARTERY BYPASS GRAFT N/A 12/01/2013   Procedure: CORONARY ARTERY BYPASS GRAFT TIMES FOUR USING  LEFT INTERNAL MAMMARY ARTERY TO LAD, SAPHENOUS VEIN GRAFTS TO DIAGONAL, CIRCUMFELX AND POSTERIOR DESCENDING;  Surgeon: Kerin Perna, MD;  Location: [facility] OR;  Service: Open Heart Surgery;  Laterality: N/A;  . ESOPHAGOGASTRODUODENOSCOPY (EGD) WITH PROPOFOL N/A 05/05/2014   Procedure: ESOPHAGOGASTRODUODENOSCOPY (EGD) WITH PROPOFOL;  Surgeon: Charolett Bumpers, MD;  Location: [facility];  Service: Endoscopy;  Laterality: N/A;  . KNEE ARTHROSCOPY  2000   Left  . LEFT AND RIGHT HEART CATHETERIZATION WITH CORONARY/GRAFT ANGIOGRAM N/A 07/07/2014   Procedure: LEFT AND RIGHT HEART CATHETERIZATION WITH Isabel Caprice;  Surgeon: Kathleene Hazel, MD;  Location: [facility] CATH LAB;  Service: Cardiovascular;  Laterality: N/A;  . LEFT HEART CATHETERIZATION WITH CORONARY ANGIOGRAM N/A 11/25/2013   Procedure: LEFT HEART CATHETERIZATION WITH CORONARY ANGIOGRAM;  Surgeon: [name], MD;  Location: [facility] CATH LAB;  Service: Cardiovascular;  Laterality: N/A;  . PACEMAKER INSERTION  2002   generator change MDT by Dr Graciela Husbands in 2012  . TEE WITHOUT CARDIOVERSION N/A 08/31/2014   Procedure: TRANSESOPHAGEAL ECHOCARDIOGRAM (TEE);  Surgeon: [name], MD;  Location: [facility] ENDOSCOPY;  Service: Cardiovascular;  Laterality: N/A;    Current Outpatient Medications  Medication Sig Dispense Refill  . aspirin EC 81 MG EC tablet Take 1 tablet (81 mg total) by mouth daily. (Patient taking differently: Take 81 mg by mouth every morning. )    . carvedilol (COREG) 25 MG tablet Take 1 tablet (25 mg total) by mouth 2 (two) times daily with a meal. 180 tablet 2  . Cholecalciferol (VITAMIN D) 2000 UNITS tablet Take 2,000 Units by mouth daily.    . Cyanocobalamin (VITAMIN B-12 PO) Take 1 tablet by mouth every morning.    . docusate sodium (COLACE) 100 MG capsule Take 100 mg by mouth 2 (two) times daily as needed for mild constipation.    . fexofenadine (ALLEGRA) 180 MG tablet Take 180 mg by mouth every morning.     . fluticasone  (FLONASE) 50 MCG/ACT nasal spray Place 2 sprays into the nose every morning.     . furosemide (LASIX) 20 MG tablet Take 1 tablet (20 mg total) by mouth 2 (two) times daily. With 80 mg to make total 100 mg. Please make appt with Dr. [name]. 1st attempt 60 tablet 0  . furosemide (LASIX) 80 MG tablet Take 1 tablet (80 mg total) by mouth 2 (two) times daily. With 20 mg to make total of 100 mg. Please make appt with Dr. [name]. 1st attempt 60 tablet 0  . Glucosamine-Chondroit-Vit C-Mn (GLUCOSAMINE CHONDR 1500 COMPLX) CAPS Take 1 capsule by mouth every morning.     . hydrALAZINE (APRESOLINE) 25 MG tablet TAKE 1 TABLET(25 MG) BY MOUTH THREE TIMES DAILY 90 tablet 5  . isosorbide mononitrate (IMDUR) 30 MG 24 hr tablet TAKE 1 TABLET BY MOUTH EVERY DAY  30 tablet 10  . L-Lysine 500 MG CAPS Take 1 capsule by mouth every morning.     . meclizine (ANTIVERT) 25 MG tablet Take 25 mg by mouth daily as needed for dizziness.     . Multiple Vitamin (MULTIVITAMIN) tablet Take 1 tablet by mouth every morning.     . nitroGLYCERIN (NITROSTAT) 0.4 MG SL tablet Place 1 tablet (0.4 mg total) under the tongue every 5 (five) minutes as needed for chest pain. (Patient taking differently: Place 0.4 mg under the tongue every 5 (five) minutes as needed for chest pain (MAX 3 TABLETS). ) 30 tablet 3  . ONE TOUCH ULTRA TEST test strip 1 each by Other route every morning.     Letta Pate DELICA LANCETS FINE MISC 1 each by Other route every morning.     . pantoprazole (PROTONIX) 40 MG tablet Take 40 mg by mouth every morning.     . potassium chloride SA (K-DUR,KLOR-CON) 20 MEQ tablet Take 2 tablets (40 mEq total) by mouth 2 (two) times daily. Please make appt with Dr. [name] before anymore refills. 1st attempt 120 tablet 0  . RA NATURAL MAGNESIUM 250 MG TABS TAKE 2 TABLETS TWICE DAILY. 120 tablet 5  . spironolactone (ALDACTONE) 25 MG tablet TAKE 1 TABLET(25 MG) BY MOUTH DAILY 90 tablet 1  . warfarin (COUMADIN) 5 MG tablet TAKE AS DIRECTED  BY COUMADIN CLINIC 135 tablet 1  . atorvastatin (LIPITOR) 80 MG tablet Take 1 tablet (80 mg total) by mouth daily. (Patient taking differently: Take 40 mg by mouth daily. ) 90 tablet 3   No current facility-administered medications for this visit.     Allergies:   Acetaminophen-codeine; Amoxicillin; and Diovan [valsartan]   Social History:  The patient  reports that he quit smoking about 51 years ago. His smoking use included cigarettes. He has a 10.00 pack-year smoking history. he has never used smokeless tobacco. He reports that he drinks alcohol. He reports that he does not use drugs.   Family History:  The patient's family history includes Colon cancer in his mother; Heart attack in his brother and mother; Leukemia in his father.  ROS:  Please see the history of present illness.  All other systems are reviewed and otherwise negative.   PHYSICAL EXAM:  VS:  BP 110/78   Pulse 74   Ht 5\' 7"  (1.702 m)   Wt 247 lb (112 kg)   BMI 38.69 kg/m  BMI: Body mass index is 38.69 kg/m. Well nourished, well developed, in no acute distress  HEENT: normocephalic, atraumatic  Neck: no JVD, carotid bruits or masses Cardiac:  RRR; no significant murmurs, no rubs, or gallops Lungs:  CTA b/l, no wheezing, rhonchi or rales  Abd: soft, non-tender, obese MS: no deformity or atrophy Ext:  no edema  Skin: warm and dry, no rash Neuro:  No gross deficits appreciated Psych: euthymic mood, full affect  PPM site: stable, no tethering, skin change, edema or tenderness   EKG:  Not done today PPM interrogation done today and reviewed by myself: battery and lead measurements are good, presenting is AP/VS, SR underlying in 50's, no device observations/arrhythmias.  10/216/18, lexiscan stress myoview  Nuclear stress EF: 42%. Ejection fraction on echocardiogram was 45%.  There was no ST segment deviation noted during stress.  This is a low risk study. No perfusion defects identified. Ejection fraction is  mildly reduced, consistent with echocardiogram.  02/08/17: TTE Study Conclusions - Left ventricle: Abnormal septal motion mid and basal  inferior   wall hypokinesis. Systolic function was mildly reduced. The   estimated ejection fraction was in the range of 45% to 50%.   Doppler parameters are consistent with abnormal left ventricular   relaxation (grade 1 diastolic dysfunction). - Aortic valve: Gradients actually appear lower than those recorded   on 02/07/16. There was moderate stenosis. Valve area (VTI): 0.74   cm^2. Valve area (Vmax): 0.7 cm^2. Valve area (Vmean): 0.74 cm^2. - Left atrium: The atrium was moderately dilated. - Atrial septum: No defect or patent foramen ovale was identified.  02/07/16: TTE, LVEF 40% 02/23/15: TTE, LVEF 35-40% 09/28/14: TTE, LVEF 25-30%, probable severe AS, dobutamine done described as moderate  07/07/14: LHC Impression: 1. 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 Moderately severe distal RCA stenosis, this vessel is no longer protected by the graft 3. Ischemic cardiomyopathy 4. Moderate aortic stenosis although severity may be underestimated given low LVEF Recommendations: He appears to remain volume overloaded. I would resume diuresis today. I cannot explain the drop in his LVEF by the occlusion of the graft to the PDA. His native RCA has moderately severe distal disease but good flow. This could be easily approached with PCI in the future. For now, would not recommend PCI of the RCA.   Recent Labs: No results found for requested labs within last 8760 hours.  05/24/2017: Chol/HDL Ratio 3.3; Cholesterol, Total 123; HDL 37; LDL Calculated 37; Triglycerides 246   CrCl cannot be calculated (Patient's most recent lab result is older than the maximum 21 days allowed.).   Wt Readings from Last 3 Encounters:  06/12/17 247 lb (112 kg)  03/13/17 247 lb (112 kg)  12/21/16 245 lb 12.8 oz (111.5 kg)     Other studies  reviewed: Additional studies/records reviewed today include: summarized above  ASSESSMENT AND PLAN:  1. PPM     Intact function  2. CAD     No anginal complaints     On ASA, statin, BB     C/w Dr.Turner  He had been recommended by Dr. [name] to increase his lipitor to 80mg  after labs though he read of potential kidney side effects and did not, he will discuss with her at his appoitment later this month  3. ICM, LVEF has improved over the years     Exam appears euvolemic, no symptoms of fluid OL     pt weighs daily, reports stable, weight is stable from last     On BB, diuretic tx, no ACE/ARB, has hx of CRI, LVEF improved to 45-50%  4. Paroxysmal Afib     CHA2DS2Vasc is at least 5, on warfarin     Monitored with the coumadin clinic  5. HTN     Looks OK, no changes  6. 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   Current medicines are reviewed at length with the patient today.  The patient did not have any concerns regarding medicines.  Norma Fredrickson, PA-C 06/12/2017 2:19 PM     [facility] [address] [place] [address] [phone] (office)  [phone] (fax)

## 2017-06-11 ENCOUNTER — Encounter (HOSPITAL_COMMUNITY)
Admission: RE | Admit: 2017-06-11 | Discharge: 2017-06-11 | Disposition: A | Discharge status: Home / Self Care | Payer: Self-pay | Source: Ambulatory Visit | Attending: Cardiology | Admitting: Cardiology

## 2017-06-12 ENCOUNTER — Ambulatory Visit: Payer: Medicare Other | Admitting: Physician Assistant

## 2017-06-12 VITALS — BP 110/78 | HR 74 | Ht 67.0 in | Wt 247.0 lb

## 2017-06-12 DIAGNOSIS — I255 Ischemic cardiomyopathy: Secondary | ICD-10-CM

## 2017-06-12 DIAGNOSIS — I2510 Atherosclerotic heart disease of native coronary artery without angina pectoris: Secondary | ICD-10-CM | POA: Diagnosis not present

## 2017-06-12 DIAGNOSIS — I480 Paroxysmal atrial fibrillation: Secondary | ICD-10-CM | POA: Diagnosis not present

## 2017-06-12 DIAGNOSIS — I10 Essential (primary) hypertension: Secondary | ICD-10-CM

## 2017-06-12 DIAGNOSIS — Z950 Presence of cardiac pacemaker: Secondary | ICD-10-CM | POA: Diagnosis not present

## 2017-06-12 NOTE — Patient Instructions (Addendum)
Medication Instructions:   Your physician recommends that you continue on your current medications as directed. Please refer to the Current Medication list given to you today.    If you need a refill on your cardiac medications before your next appointment, please call your pharmacy.  Labwork: NONE ORDERED  TODAY    Testing/Procedures: NONE ORDERED  TODAY    Follow-Up:   NONE ORDERED  TODAY    Your physician wants you to follow-up in: ONE YEAR WITH  Graciela Husbands  You will receive a reminder letter in the mail two months in advance. If you don't receive a letter, please call our office to schedule the follow-up appointment.   Remote monitoring is used to monitor your Pacemaker of ICD from home. This monitoring reduces the number of office visits required to check your device to one time per year. It allows [place] to keep an eye on the functioning of your device to ensure it is working properly. You are scheduled for a device check from home on . 07-25-17 You may send your transmission at any time that day. If you have a wireless device, the transmission will be sent automatically. After your physician reviews your transmission, you will receive a postcard with your next transmission date.     Any Other Special Instructions Will Be Listed Below (If Applicable).

## 2017-06-13 ENCOUNTER — Other Ambulatory Visit: Payer: Self-pay | Admitting: Cardiology

## 2017-06-13 ENCOUNTER — Encounter (HOSPITAL_COMMUNITY)
Admission: RE | Admit: 2017-06-13 | Discharge: 2017-06-13 | Disposition: A | Discharge status: Home / Self Care | Payer: Self-pay | Source: Ambulatory Visit | Attending: Cardiology | Admitting: Cardiology

## 2017-06-15 ENCOUNTER — Encounter (HOSPITAL_COMMUNITY)
Admission: RE | Admit: 2017-06-15 | Discharge: 2017-06-15 | Disposition: A | Discharge status: Home / Self Care | Payer: Self-pay | Source: Ambulatory Visit | Attending: Cardiology | Admitting: Cardiology

## 2017-06-18 ENCOUNTER — Encounter (HOSPITAL_COMMUNITY)
Admission: RE | Admit: 2017-06-18 | Discharge: 2017-06-18 | Disposition: A | Discharge status: Home / Self Care | Payer: Medicare Other | Source: Ambulatory Visit | Attending: Cardiology | Admitting: Cardiology

## 2017-06-20 ENCOUNTER — Encounter (HOSPITAL_COMMUNITY)
Admission: RE | Admit: 2017-06-20 | Discharge: 2017-06-20 | Disposition: A | Discharge status: Home / Self Care | Payer: Self-pay | Source: Ambulatory Visit | Attending: Cardiology | Admitting: Cardiology

## 2017-06-22 ENCOUNTER — Encounter (HOSPITAL_COMMUNITY)
Admission: RE | Admit: 2017-06-22 | Discharge: 2017-06-22 | Disposition: A | Discharge status: Home / Self Care | Payer: Medicare Other | Source: Ambulatory Visit | Attending: Cardiology | Admitting: Cardiology

## 2017-06-25 ENCOUNTER — Encounter (HOSPITAL_COMMUNITY)
Admission: RE | Admit: 2017-06-25 | Discharge: 2017-06-25 | Disposition: A | Discharge status: Home / Self Care | Payer: Self-pay | Source: Ambulatory Visit | Attending: Cardiology | Admitting: Cardiology

## 2017-06-26 ENCOUNTER — Encounter: Payer: Self-pay | Admitting: Cardiology

## 2017-06-26 ENCOUNTER — Ambulatory Visit: Payer: Medicare Other | Admitting: Cardiology

## 2017-06-26 VITALS — BP 118/58 | HR 71 | Ht 67.0 in | Wt 244.0 lb

## 2017-06-26 DIAGNOSIS — I480 Paroxysmal atrial fibrillation: Secondary | ICD-10-CM

## 2017-06-26 DIAGNOSIS — I5042 Chronic combined systolic (congestive) and diastolic (congestive) heart failure: Secondary | ICD-10-CM | POA: Diagnosis not present

## 2017-06-26 DIAGNOSIS — E785 Hyperlipidemia, unspecified: Secondary | ICD-10-CM | POA: Diagnosis not present

## 2017-06-26 DIAGNOSIS — I6529 Occlusion and stenosis of unspecified carotid artery: Secondary | ICD-10-CM | POA: Diagnosis not present

## 2017-06-26 DIAGNOSIS — I10 Essential (primary) hypertension: Secondary | ICD-10-CM | POA: Diagnosis not present

## 2017-06-26 DIAGNOSIS — I2510 Atherosclerotic heart disease of native coronary artery without angina pectoris: Secondary | ICD-10-CM

## 2017-06-26 DIAGNOSIS — I350 Nonrheumatic aortic (valve) stenosis: Secondary | ICD-10-CM | POA: Diagnosis not present

## 2017-06-26 MED ORDER — ATORVASTATIN CALCIUM 80 MG PO TABS: 80.0000 mg | ORAL_TABLET | Freq: Every day | ORAL | 3 refills | Status: DC

## 2017-06-26 NOTE — Progress Notes (Signed)
Cardiology Office Note:    Date:  06/26/2017   ID:  Brandon Klein, DOB 01-03-1941, MRN [id number]  PCP:  Leighton Ruff, MD  Cardiologist:  No primary care provider on file.    Referring MD: Leighton Ruff, MD   Chief Complaint  Patient presents with  . Coronary Artery Disease  . Hypertension  . Hyperlipidemia  . Atrial Fibrillation  . Aortic Stenosis    History of Present Illness:    Brandon Klein is a 77 y.o. male with a hx of CAD, status post prior stenting to the LAD and balloon angioplasty to the circumflex, cath 10/2013 with severe 3 vessel ASCAD s/p CABG (LIMA-LAD, SVG to diag,SVG to circ, SVG to PDA) with normal LVF at time of cath, paroxysmal atrial fibrillation, bradycardia s/p PPM, moderate to severe aortic stenosis by recent dobutamine echo, HTN, HL. He developed acute CHF about 6 months after CABG and was found to have severe LV dysfunction. Cath 07/07/2014 showed occluded SVG to PDA, all other grafts patent. EF down felt secondary to NICM but there has been some concern regarding possible worsening AS. There was some concern that his AS was underestimated due to LV dysfunction and he underwent dobutamine echo. The aortic valve peak velocity increased from 2.59msec to 3.29 m/sec at peak dobutamine infusion. The mean AV gradient increased from 25mg to 37.68m9m at peak dobutamine infusion. The AVA was calculated anywhere from 1.4cm2 at baselineto 1.15cm2 at mid dose and 1.4cm2 at high dose. All data consistent with at least moderate aortic stenosis. He underwent TEE to get a planimetered valve area which was1.14-1.4cm2 c/w moderate AS. EF at that time was 20-25%. He was referred to Dr. AllRayann Hemanr consideration of prophylatic AICD implantation. 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 He did not meet criteria for CRT due to narrow QRS on EKG. He decided  not to pursue ICD at that time and wanted to wait until his appt with Dr. KleCaryl Comes August. Repeat echo at that time showed EF 35-40% and ICD was not indicated. He was noted to have NSVT on pacer check and underwent stress test showing no ischemia.  Repeat echo 02/2017 showed EF 45-50%.    He is here today for followup and is doing well.  He denies any chest pain or pressure, SOB, DOE, PND, orthopnea, LE edema, dizziness, palpitations or syncope. He occasionally has some wheezing when he first gets up.  He is compliant with his meds and is tolerating meds with no SE.    Past Medical History:  Diagnosis Date  . AS (aortic stenosis)    moderate AS 2018  . Carotid artery plaque    a. Duplex 10/2013: mild calcific plaque origin ICA. Left: intimal wall thickening CCA. 0-39% BICA.  . CMarland Kitchenronic renal insufficiency   . Chronic systolic dysfunction of left ventricle   . Coronary artery disease    a. s/p PCI of LAD 1997. b. Cutting balloon angioplasty to LCx in 2002. c. s/p cath 11/2013 with severe 3 vessel ASCAD s/p CABG with LIMA to LAD, SVG to diag, SVG to left circ and SVG to PDA. d. cath 07/07/2014 occluded SVG to PDA, all other grafts patent. 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 DJD (degenerative joint disease)   . Epistaxis   . GERD (gastroesophageal reflux disease)   . GI bleeding    a.  with benign  gastric polypectomy 05/2014.  Marland Kitchen HLD (hyperlipidemia)   . HTN (hypertension)   . Hypokalemia   . Hypomagnesemia   . Impaired fasting glucose    A1C check every year  . Obesity   . OSA (obstructive sleep apnea)   . Pacemaker    a. 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 b. MDT generator replacement 2012.  Marland Kitchen PAF (paroxysmal atrial fibrillation) ([facility])    on chronic systemic anticoagulation  . PVC's (premature ventricular contractions)   . Syncope    a. vasovagal with documented bradycardia.  . Type 2 diabetes mellitus without complications ([place]) 07/05/348    Past Surgical History:    Procedure Laterality Date  . ANGIOPLASTY  1997  . CHOLECYSTECTOMY    . COLONOSCOPY WITH PROPOFOL N/A 05/05/2014   Procedure: COLONOSCOPY WITH PROPOFOL;  Surgeon: Garlan Fair, MD;  Location: [facility];  Service: Endoscopy;  Laterality: N/A;  . CORONARY ARTERY BYPASS GRAFT N/A 12/01/2013   Procedure: CORONARY ARTERY BYPASS GRAFT TIMES FOUR USING LEFT INTERNAL MAMMARY ARTERY TO LAD, SAPHENOUS VEIN GRAFTS TO DIAGONAL, CIRCUMFELX AND POSTERIOR DESCENDING;  Surgeon: Ivin Poot, MD;  Location: [facility];  Service: Open Heart Surgery;  Laterality: N/A;  . ESOPHAGOGASTRODUODENOSCOPY (EGD) WITH PROPOFOL N/A 05/05/2014   Procedure: ESOPHAGOGASTRODUODENOSCOPY (EGD) WITH PROPOFOL;  Surgeon: Garlan Fair, MD;  Location: [facility];  Service: Endoscopy;  Laterality: N/A;  . KNEE ARTHROSCOPY  2000   Left  . LEFT AND RIGHT HEART CATHETERIZATION WITH CORONARY/GRAFT ANGIOGRAM N/A 07/07/2014   Procedure: LEFT AND RIGHT HEART CATHETERIZATION WITH Beatrix Fetters;  Surgeon: Burnell Blanks, MD;  Location: [facility] LAB;  Service: Cardiovascular;  Laterality: N/A;  . LEFT HEART CATHETERIZATION WITH CORONARY ANGIOGRAM N/A 11/25/2013   Procedure: LEFT HEART CATHETERIZATION WITH CORONARY ANGIOGRAM;  Surgeon: Sueanne Margarita, MD;  Location: [facility];  Service: Cardiovascular;  Laterality: N/A;  . PACEMAKER INSERTION  2002   generator change MDT by Dr Caryl Comes in 2012  . TEE WITHOUT CARDIOVERSION N/A 08/31/2014   Procedure: TRANSESOPHAGEAL ECHOCARDIOGRAM (TEE);  Surgeon: Sueanne Margarita, MD;  Location: [facility] END[facility];  Service: Cardiovascular;  Laterality: N/A;    Current Medications: Current Meds  Medication Sig  . aspirin EC 81 MG EC tablet Take 1 tablet (81 mg total) by mouth daily. (Patient taking differently: Take 81 mg by mouth every morning. )  . atorvastatin (LIPITOR) 80 MG tablet Take 1 tablet (80 mg total) by mouth daily. (Patient taking differently: Take 40 mg by mouth daily. )  .  carvedilol (COREG) 25 MG tablet Take 1 tablet (25 mg total) by mouth 2 (two) times daily with a meal.  . Cholecalciferol (VITAMIN D) 2000 UNITS tablet Take 2,000 Units by mouth daily.  . Cyanocobalamin (VITAMIN B-12 PO) Take 1 tablet by mouth every morning.  . docusate sodium (COLACE) 100 MG capsule Take 100 mg by mouth 2 (two) times daily as needed for mild constipation.  . fexofenadine (ALLEGRA) 180 MG tablet Take 180 mg by mouth every morning.   . fluticasone (FLONASE) 50 MCG/ACT nasal spray Place 2 sprays into the nose every morning.   . furosemide (LASIX) 20 MG tablet TAKE 1 TABLET BY MOUTH TWICE DAILY WITH 80MG TO MAKE TOTAL 100MG.  . furosemide (LASIX) 80 MG tablet TAKE 1 TABLET BY MOUTH TWICE DAILY. TAKE WITH 20 MG TABLET. PATIENT NEEDS APPT  . Glucosamine-Chondroit-Vit C-Mn (GLUCOSAMINE CHONDR 1500 COMPLX) CAPS Take 1 capsule by mouth every morning.   . hydrALAZINE (  APRESOLINE) 25 MG tablet TAKE 1 TABLET(25 MG) BY MOUTH THREE TIMES DAILY  . isosorbide mononitrate (IMDUR) 30 MG 24 hr tablet TAKE 1 TABLET BY MOUTH EVERY DAY  . L-Lysine 500 MG CAPS Take 1 capsule by mouth every morning.   . meclizine (ANTIVERT) 25 MG tablet Take 25 mg by mouth daily as needed for dizziness.   . Multiple Vitamin (MULTIVITAMIN) tablet Take 1 tablet by mouth every morning.   . nitroGLYCERIN (NITROSTAT) 0.4 MG SL tablet Place 1 tablet (0.4 mg total) under the tongue every 5 (five) minutes as needed for chest pain. (Patient taking differently: Place 0.4 mg under the tongue every 5 (five) minutes as needed for chest pain (MAX 3 TABLETS). )  . ONE TOUCH ULTRA TEST test strip 1 each by Other route every morning.   Glory Rosebush DELICA LANCETS FINE MISC 1 each by Other route every morning.   . pantoprazole (PROTONIX) 40 MG tablet Take 40 mg by mouth every morning.   . potassium chloride SA (K-DUR,KLOR-CON) 20 MEQ tablet TAKE 2 TABLETS BY MOUTH TWICE DAILY  . RA NATURAL MAGNESIUM 250 MG TABS TAKE 2 TABLETS TWICE DAILY.    Marland Kitchen spironolactone (ALDACTONE) 25 MG tablet TAKE 1 TABLET(25 MG) BY MOUTH DAILY  . warfarin (COUMADIN) 5 MG tablet USE AS DIRECTED BY COUMADIN CLINIC     Allergies:   Acetaminophen-codeine; Amoxicillin; and Diovan [valsartan]   Social History   Socioeconomic History  . Marital status: Married    Spouse name: None  . Number of children: None  . Years of education: None  . Highest education level: None  Social Needs  . Financial resource strain: None  . Food insecurity - worry: None  . Food insecurity - inability: None  . Transportation needs - medical: None  . Transportation needs - non-medical: None  Occupational History  . None  Tobacco Use  . Smoking status: Former Smoker    Packs/day: 2.00    Years: 5.00    Pack years: 10.00    Types: Cigarettes    Last attempt to quit: 05/29/1966    Years since quitting: 51.1  . Smokeless tobacco: Never Used  Substance and Sexual Activity  . Alcohol use: Yes    Alcohol/week: 0.0 oz    Comment: ocassional  . Drug use: No  . Sexual activity: None  Other Topics Concern  . None  Social History Narrative   Lives in [place] with spouse.  Retired Hotel manager     Family History: The patient's family history includes Colon cancer in his mother; Heart attack in his brother and mother; Leukemia in his father.  ROS:   Please see the history of present illness.    Review of Systems  Respiratory: Positive for wheezing.     All other systems reviewed and negative.   EKGs/Labs/Other Studies Reviewed:    The following studies were reviewed today: none  EKG:  EKG is not ordered today.    Recent Labs: No results found for requested labs within last 8760 hours.   Recent Lipid Panel    Component Value Date/Time   CHOL 123 05/24/2017 1115   CHOL 113 07/22/2014 1226   TRIG 246 (H) 05/24/2017 1115   TRIG 91 07/22/2014 1226   HDL 37 (L) 05/24/2017 1115   HDL 40 07/22/2014 1226   CHOLHDL 3.3 05/24/2017 1115   CHOLHDL 3.4 06/04/2015  1216   VLDL 39 (H) 06/04/2015 1216   LDLCALC 37 05/24/2017 1115   LDLCALC 55 07/22/2014  1226   LDLDIRECT 47.9 07/14/2013 0937    Physical Exam:    VS:  BP (!) 118/58   Pulse 71   Ht _0  (1.702 m)   Wt 244 lb (110.7 kg)   SpO2 96%   BMI 38.22 kg/m     Wt Readings from Last 3 Encounters:  06/26/17 244 lb (110.7 kg)  06/12/17 247 lb (112 kg)  03/13/17 247 lb (112 kg)     GEN:  Well nourished, well developed in no acute distress HEENT: Normal NECK: No JVD; No carotid bruits LYMPHATICS: No lymphadenopathy CARDIAC: RRR, no murmurs, rubs, gallops RESPIRATORY:  Clear to auscultation without rales, wheezing or rhonchi  ABDOMEN: Soft, non-tender, non-distended MUSCULOSKELETAL:  No edema; No deformity  SKIN: Warm and dry NEUROLOGIC:  Alert and oriented x 3 PSYCHIATRIC:  Normal affect   ASSESSMENT:    1. PAF (paroxysmal atrial fibrillation) ([place])   2. Nonrheumatic aortic valve stenosis   3. Carotid atherosclerosis, unspecified laterality   4. Chronic combined systolic and diastolic CHF (congestive heart failure) ([place])   5. Essential hypertension   6. Coronary artery disease involving native coronary artery of native heart without angina pectoris   7. Dyslipidemia    PLAN:    In order of problems listed above:  1.  Paroxysmal atrial fibrillation - he is maintaining NSR today on exam. He will continue on Carvedilol 70m BID.  He will continue on warfarin followed in our coumadin clinic for a CHADS2VASC score of 6.  He denies any problems with bleeding.   2.  Moderate AS by echo 01/2017 - repeat echo 01/2018  3.  Bilateral carotid artery stenosis - dopplers 05/2016 showed 1-39% stenosis - repeat in 1 year.  Continue statin.  4.  Chronic combined systolic/diastolic CHF - he appears euvolemic on exam today.  His weight is stable.   Echo 01/2017 with improved EF He will continue on carvedilol 254mBID, aldactone 2561madily, Hydralazine 41m73mD, Lasix 100mg12m and Imdur 30mg 14mly.  Check BMET to make sure renal function is stable.   5.  HTN - BP is well controlled on exam today.  He will continue on Carvedilol 41mg B34mspironolactone 41mg da58m hydralazine 41mg TID31m Imdur 30mg dail27mI will check a BMET.  6.  ASCAD - status post prior stenting to the LAD and balloon angioplasty to the circumflex, cath 10/2013 with severe 3 vessel ASCAD s/p CABG (LIMA-LAD, SVG to diag,SVG to circ, SVG to PDA).  He denies any anginal symptoms.  He will continue on long acting nitrates, BB , ASA and statin.   7.  Dyslipidemia with LDL goal < 70.  It was recommended that he increase his Lipitor to 80mg daily78m he did not do it because he does not want to hurt his kidneys.  We had a very long discussion about hyperlipidemia and metabolic syndrome today and I told him new guidelines want TAGS< 100.  His were 246 and he is still has significant risk of progression of CAD even with an LDL of 37.  He has agreed to increase Lipitor to 80mg daily 1mif TAGs still not at goal then consider adding Vascepa.  I have spent 45 minutes discussing lipids management with the patient and answering multiple questions.    Medication Adjustments/Labs and Tests Ordered: Current medicines are reviewed at length with the patient today.  Concerns regarding medicines are outlined above.  No orders of the defined types were placed in  this encounter.  No orders of the defined types were placed in this encounter.   Signed, Fransico Him, MD  06/26/2017 2:05 PM    [place]

## 2017-06-26 NOTE — Patient Instructions (Signed)
Medication Instructions:  Your physician has recommended you make the following change in your medication:   INCREASE: Lipitor 80 mg once a day   Labwork: Today for kidney function test   Your physician recommends that you return for lab work in: 6 weeks for liver function and fasting lipids  Testing/Procedures: Your physician has requested that you have an echocardiogram in September 2019. Echocardiography is a painless test that uses sound waves to create images of your heart. It provides your doctor with information about the size and shape of your heart and how well your heart's chambers and valves are working. This procedure takes approximately one hour. There are no restrictions for this procedure.   Follow-Up: Your physician wants you to follow-up in: 6 months with PA. You will receive a reminder letter in the mail two months in advance. If you don't receive a letter, please call our office to schedule the follow-up appointment.  Your physician wants you to follow-up in: 1 year with Dr. [name]. You will receive a reminder letter in the mail two months in advance. If you don't receive a letter, please call our office to schedule the follow-up appointment.   Any Other Special Instructions Will Be Listed Below (If Applicable).     If you need a refill on your cardiac medications before your next appointment, please call your pharmacy.

## 2017-06-27 ENCOUNTER — Encounter (HOSPITAL_COMMUNITY)
Admission: RE | Admit: 2017-06-27 | Discharge: 2017-06-27 | Disposition: A | Discharge status: Home / Self Care | Payer: Self-pay | Source: Ambulatory Visit | Attending: Cardiology | Admitting: Cardiology

## 2017-06-27 LAB — BASIC METABOLIC PANEL
BUN / CREAT RATIO: 17 (ref 10–24)
BUN: 24 mg/dL (ref 8–27)
CO2: 21 mmol/L (ref 20–29)
Calcium: 9.4 mg/dL (ref 8.6–10.2)
Chloride: 102 mmol/L (ref 96–106)
Creatinine, Ser: 1.4 mg/dL — ABNORMAL HIGH (ref 0.76–1.27)
GFR calc Af Amer: 56 mL/min/{1.73_m2} — ABNORMAL LOW (ref >59)
GFR calc non Af Amer: 48 mL/min/{1.73_m2} — ABNORMAL LOW (ref >59)
GLUCOSE: 111 mg/dL — AB (ref 65–99)
Potassium: 4.8 mmol/L (ref 3.5–5.2)
Sodium: 141 mmol/L (ref 134–144)

## 2017-06-29 ENCOUNTER — Encounter (HOSPITAL_COMMUNITY)
Admission: RE | Admit: 2017-06-29 | Discharge: 2017-06-29 | Disposition: A | Discharge status: Home / Self Care | Payer: Self-pay | Source: Ambulatory Visit | Attending: Cardiology | Admitting: Cardiology

## 2017-06-29 ENCOUNTER — Ambulatory Visit: Payer: Medicare Other | Admitting: Pharmacist

## 2017-06-29 DIAGNOSIS — I4891 Unspecified atrial fibrillation: Secondary | ICD-10-CM

## 2017-06-29 DIAGNOSIS — Z5181 Encounter for therapeutic drug level monitoring: Secondary | ICD-10-CM | POA: Diagnosis not present

## 2017-06-29 DIAGNOSIS — I480 Paroxysmal atrial fibrillation: Secondary | ICD-10-CM

## 2017-06-29 DIAGNOSIS — Z951 Presence of aortocoronary bypass graft: Secondary | ICD-10-CM | POA: Insufficient documentation

## 2017-06-29 LAB — POCT INR: INR: 2.8

## 2017-06-29 NOTE — Patient Instructions (Signed)
Description   Continue taking on same dosage 1.5 tablets every day except 1 tablet on Tuesdays and  Thursdays.  Recheck INR in 6 weeks.       

## 2017-07-02 ENCOUNTER — Encounter (HOSPITAL_COMMUNITY)
Admission: RE | Admit: 2017-07-02 | Discharge: 2017-07-02 | Disposition: A | Discharge status: Home / Self Care | Payer: Self-pay | Source: Ambulatory Visit | Attending: Cardiology | Admitting: Cardiology

## 2017-07-04 ENCOUNTER — Encounter (HOSPITAL_COMMUNITY)
Admission: RE | Admit: 2017-07-04 | Discharge: 2017-07-04 | Disposition: A | Discharge status: Home / Self Care | Payer: Self-pay | Source: Ambulatory Visit | Attending: Cardiology | Admitting: Cardiology

## 2017-07-06 ENCOUNTER — Encounter (HOSPITAL_COMMUNITY)
Admission: RE | Admit: 2017-07-06 | Discharge: 2017-07-06 | Disposition: A | Discharge status: Home / Self Care | Payer: Self-pay | Source: Ambulatory Visit | Attending: Cardiology | Admitting: Cardiology

## 2017-07-09 ENCOUNTER — Encounter (HOSPITAL_COMMUNITY)
Admission: RE | Admit: 2017-07-09 | Discharge: 2017-07-09 | Disposition: A | Discharge status: Home / Self Care | Payer: Self-pay | Source: Ambulatory Visit | Attending: Cardiology | Admitting: Cardiology

## 2017-07-10 ENCOUNTER — Other Ambulatory Visit: Payer: Medicare Other

## 2017-07-11 ENCOUNTER — Encounter (HOSPITAL_COMMUNITY)
Admission: RE | Admit: 2017-07-11 | Discharge: 2017-07-11 | Disposition: A | Discharge status: Home / Self Care | Payer: Self-pay | Source: Ambulatory Visit | Attending: Cardiology | Admitting: Cardiology

## 2017-07-13 ENCOUNTER — Encounter (HOSPITAL_COMMUNITY)
Admission: RE | Admit: 2017-07-13 | Discharge: 2017-07-13 | Disposition: A | Discharge status: Home / Self Care | Payer: Self-pay | Source: Ambulatory Visit | Attending: Cardiology | Admitting: Cardiology

## 2017-07-16 ENCOUNTER — Encounter (HOSPITAL_COMMUNITY)
Admission: RE | Admit: 2017-07-16 | Discharge: 2017-07-16 | Disposition: A | Discharge status: Home / Self Care | Payer: Self-pay | Source: Ambulatory Visit | Attending: Cardiology | Admitting: Cardiology

## 2017-07-18 ENCOUNTER — Encounter (HOSPITAL_COMMUNITY)
Admission: RE | Admit: 2017-07-18 | Discharge: 2017-07-18 | Disposition: A | Discharge status: Home / Self Care | Payer: Self-pay | Source: Ambulatory Visit | Attending: Cardiology | Admitting: Cardiology

## 2017-07-20 ENCOUNTER — Encounter (HOSPITAL_COMMUNITY)
Admission: RE | Admit: 2017-07-20 | Discharge: 2017-07-20 | Disposition: A | Discharge status: Home / Self Care | Payer: Medicare Other | Source: Ambulatory Visit | Attending: Cardiology | Admitting: Cardiology

## 2017-07-23 ENCOUNTER — Encounter (HOSPITAL_COMMUNITY)
Admission: RE | Admit: 2017-07-23 | Discharge: 2017-07-23 | Disposition: A | Discharge status: Home / Self Care | Payer: Self-pay | Source: Ambulatory Visit | Attending: Cardiology | Admitting: Cardiology

## 2017-07-25 ENCOUNTER — Encounter (HOSPITAL_COMMUNITY)
Admission: RE | Admit: 2017-07-25 | Discharge: 2017-07-25 | Disposition: A | Discharge status: Home / Self Care | Payer: Self-pay | Source: Ambulatory Visit | Attending: Cardiology | Admitting: Cardiology

## 2017-07-25 ENCOUNTER — Ambulatory Visit (INDEPENDENT_AMBULATORY_CARE_PROVIDER_SITE_OTHER): Payer: Medicare Other | Admitting: *Deleted

## 2017-07-25 DIAGNOSIS — I495 Sick sinus syndrome: Secondary | ICD-10-CM

## 2017-07-25 NOTE — Progress Notes (Signed)
Remote pacemaker transmission.   

## 2017-07-26 ENCOUNTER — Encounter: Payer: Self-pay | Admitting: Cardiology

## 2017-07-27 ENCOUNTER — Encounter (HOSPITAL_COMMUNITY)
Admission: RE | Admit: 2017-07-27 | Discharge: 2017-07-27 | Disposition: A | Discharge status: Home / Self Care | Payer: Self-pay | Source: Ambulatory Visit | Attending: Cardiology | Admitting: Cardiology

## 2017-07-27 DIAGNOSIS — Z951 Presence of aortocoronary bypass graft: Secondary | ICD-10-CM | POA: Insufficient documentation

## 2017-07-30 ENCOUNTER — Encounter (HOSPITAL_COMMUNITY)
Admission: RE | Admit: 2017-07-30 | Discharge: 2017-07-30 | Disposition: A | Discharge status: Home / Self Care | Payer: Medicare Other | Source: Ambulatory Visit | Attending: Cardiology | Admitting: Cardiology

## 2017-08-01 ENCOUNTER — Encounter (HOSPITAL_COMMUNITY)
Admission: RE | Admit: 2017-08-01 | Discharge: 2017-08-01 | Disposition: A | Discharge status: Home / Self Care | Payer: Self-pay | Source: Ambulatory Visit | Attending: Cardiology | Admitting: Cardiology

## 2017-08-03 ENCOUNTER — Encounter (HOSPITAL_COMMUNITY)
Admission: RE | Admit: 2017-08-03 | Discharge: 2017-08-03 | Disposition: A | Discharge status: Home / Self Care | Payer: Self-pay | Source: Ambulatory Visit | Attending: Cardiology | Admitting: Cardiology

## 2017-08-06 ENCOUNTER — Encounter (HOSPITAL_COMMUNITY)
Admission: RE | Admit: 2017-08-06 | Discharge: 2017-08-06 | Disposition: A | Discharge status: Home / Self Care | Payer: Self-pay | Source: Ambulatory Visit | Attending: Cardiology | Admitting: Cardiology

## 2017-08-07 ENCOUNTER — Other Ambulatory Visit (INDEPENDENT_AMBULATORY_CARE_PROVIDER_SITE_OTHER): Payer: Medicare Other

## 2017-08-07 DIAGNOSIS — E785 Hyperlipidemia, unspecified: Secondary | ICD-10-CM

## 2017-08-07 DIAGNOSIS — I4891 Unspecified atrial fibrillation: Secondary | ICD-10-CM

## 2017-08-07 LAB — HEPATIC FUNCTION PANEL
ALT: 17 IU/L (ref 0–44)
AST: 17 IU/L (ref 0–40)
Albumin: 4.4 g/dL (ref 3.5–4.8)
Alkaline Phosphatase: 62 IU/L (ref 39–117)
Bilirubin Total: 0.9 mg/dL (ref 0.0–1.2)
Bilirubin, Direct: 0.25 mg/dL (ref 0.00–0.40)
TOTAL PROTEIN: 6.8 g/dL (ref 6.0–8.5)

## 2017-08-07 LAB — LIPID PANEL
CHOL/HDL RATIO: 3 ratio (ref 0.0–5.0)
Cholesterol, Total: 117 mg/dL (ref 100–199)
HDL: 39 mg/dL — ABNORMAL LOW (ref >39)
LDL CALC: 41 mg/dL (ref 0–99)
TRIGLYCERIDES: 183 mg/dL — AB (ref 0–149)
VLDL Cholesterol Cal: 37 mg/dL (ref 5–40)

## 2017-08-08 ENCOUNTER — Encounter (HOSPITAL_COMMUNITY)
Admission: RE | Admit: 2017-08-08 | Discharge: 2017-08-08 | Disposition: A | Discharge status: Home / Self Care | Payer: Self-pay | Source: Ambulatory Visit | Attending: Cardiology | Admitting: Cardiology

## 2017-08-08 ENCOUNTER — Telehealth: Payer: Self-pay

## 2017-08-08 MED ORDER — ICOSAPENT ETHYL 1 G PO CAPS: 2.0000 g | ORAL_CAPSULE | Freq: Two times a day (BID) | ORAL | 3 refills | Status: DC

## 2017-08-08 NOTE — Telephone Encounter (Signed)
-----   Message from [name], MD sent at 08/07/2017  8:59 PM EDT ----- Lipids still not at goal.  Please forward to lipid clinic for further recommendations on persistently elevated TAGS

## 2017-08-08 NOTE — Telephone Encounter (Signed)
Notes recorded by Phineas Semen, RN on 08/08/2017 at 3:06 PM EDT Patient made aware of lab results and Megan's recommendation for Vascepa 2 g BID to help lower TG levels. Patient in agreement with plan and thankful for the call.  Notes recorded by Awilda Metro, RPH on 08/08/2017 at 12:30 PM EDT Could try Vascepa 2g BID to help lower TG and for positive CV outcomes data in patients with ASCVD on background statin therapy. If insurance does not cover, would use Lovaza 2g daily.

## 2017-08-10 ENCOUNTER — Encounter (HOSPITAL_COMMUNITY)
Admission: RE | Admit: 2017-08-10 | Discharge: 2017-08-10 | Disposition: A | Discharge status: Home / Self Care | Payer: Medicare Other | Source: Ambulatory Visit | Attending: Cardiology | Admitting: Cardiology

## 2017-08-10 ENCOUNTER — Ambulatory Visit (INDEPENDENT_AMBULATORY_CARE_PROVIDER_SITE_OTHER): Payer: Medicare Other | Admitting: *Deleted

## 2017-08-10 DIAGNOSIS — Z5181 Encounter for therapeutic drug level monitoring: Secondary | ICD-10-CM | POA: Diagnosis not present

## 2017-08-10 DIAGNOSIS — I480 Paroxysmal atrial fibrillation: Secondary | ICD-10-CM | POA: Diagnosis not present

## 2017-08-10 LAB — POCT INR: INR: 2.7

## 2017-08-10 NOTE — Patient Instructions (Signed)
Description   Continue taking on same dosage 1.5 tablets every day except 1 tablet on Tuesdays and  Thursdays.  Recheck INR in 6 weeks.       

## 2017-08-11 LAB — CUP PACEART REMOTE DEVICE CHECK
Brady Statistic AP VP Percent: 0 %
Brady Statistic AP VS Percent: 65 %
Brady Statistic AS VP Percent: 0 %
Implantable Lead Implant Date: 20020926
Implantable Lead Implant Date: 20020926
Implantable Lead Location: 753860
Implantable Lead Model: 5092
Implantable Pulse Generator Implant Date: 20120905
Lead Channel Impedance Value: 518 Ohm
Lead Channel Impedance Value: 944 Ohm
Lead Channel Pacing Threshold Amplitude: 0.625 V
Lead Channel Pacing Threshold Amplitude: 1 V
Lead Channel Pacing Threshold Pulse Width: 0.4 ms
Lead Channel Pacing Threshold Pulse Width: 0.4 ms
MDC IDC LEAD LOCATION: 753859
MDC IDC MSMT BATTERY IMPEDANCE: 428 Ohm
MDC IDC MSMT BATTERY REMAINING LONGEVITY: 99 mo
MDC IDC MSMT BATTERY VOLTAGE: 2.78 V
MDC IDC SESS DTM: 20190227162729
MDC IDC SET LEADCHNL RA PACING AMPLITUDE: 2 V
MDC IDC SET LEADCHNL RV PACING AMPLITUDE: 2.5 V
MDC IDC SET LEADCHNL RV PACING PULSEWIDTH: 0.4 ms
MDC IDC SET LEADCHNL RV SENSING SENSITIVITY: 5.6 mV
MDC IDC STAT BRADY AS VS PERCENT: 34 %

## 2017-08-13 ENCOUNTER — Encounter (HOSPITAL_COMMUNITY): Payer: Self-pay

## 2017-08-13 ENCOUNTER — Other Ambulatory Visit: Payer: Self-pay | Admitting: Cardiology

## 2017-08-15 ENCOUNTER — Encounter (HOSPITAL_COMMUNITY): Payer: Self-pay

## 2017-08-16 ENCOUNTER — Other Ambulatory Visit: Payer: Self-pay | Admitting: Cardiology

## 2017-08-17 ENCOUNTER — Encounter (HOSPITAL_COMMUNITY): Payer: Self-pay

## 2017-08-20 ENCOUNTER — Encounter (HOSPITAL_COMMUNITY)
Admission: RE | Admit: 2017-08-20 | Discharge: 2017-08-20 | Disposition: A | Discharge status: Home / Self Care | Payer: Self-pay | Source: Ambulatory Visit | Attending: Cardiology | Admitting: Cardiology

## 2017-08-22 ENCOUNTER — Encounter (HOSPITAL_COMMUNITY): Payer: Self-pay

## 2017-08-24 ENCOUNTER — Encounter (HOSPITAL_COMMUNITY)
Admission: RE | Admit: 2017-08-24 | Discharge: 2017-08-24 | Disposition: A | Discharge status: Home / Self Care | Payer: Medicare Other | Source: Ambulatory Visit | Attending: Cardiology | Admitting: Cardiology

## 2017-08-27 ENCOUNTER — Encounter (HOSPITAL_COMMUNITY)
Admission: RE | Admit: 2017-08-27 | Discharge: 2017-08-27 | Disposition: A | Discharge status: Home / Self Care | Payer: Self-pay | Source: Ambulatory Visit | Attending: Cardiology | Admitting: Cardiology

## 2017-08-27 DIAGNOSIS — Z951 Presence of aortocoronary bypass graft: Secondary | ICD-10-CM | POA: Insufficient documentation

## 2017-08-29 ENCOUNTER — Encounter (HOSPITAL_COMMUNITY)
Admission: RE | Admit: 2017-08-29 | Discharge: 2017-08-29 | Disposition: A | Discharge status: Home / Self Care | Payer: Medicare Other | Source: Ambulatory Visit | Attending: Cardiology | Admitting: Cardiology

## 2017-08-31 ENCOUNTER — Encounter (HOSPITAL_COMMUNITY)
Admission: RE | Admit: 2017-08-31 | Discharge: 2017-08-31 | Disposition: A | Discharge status: Home / Self Care | Payer: Medicare Other | Source: Ambulatory Visit | Attending: Cardiology | Admitting: Cardiology

## 2017-09-03 ENCOUNTER — Encounter (HOSPITAL_COMMUNITY)
Admission: RE | Admit: 2017-09-03 | Discharge: 2017-09-03 | Disposition: A | Discharge status: Home / Self Care | Payer: Self-pay | Source: Ambulatory Visit | Attending: Cardiology | Admitting: Cardiology

## 2017-09-05 ENCOUNTER — Encounter (HOSPITAL_COMMUNITY)
Admission: RE | Admit: 2017-09-05 | Discharge: 2017-09-05 | Disposition: A | Discharge status: Home / Self Care | Payer: Self-pay | Source: Ambulatory Visit | Attending: Cardiology | Admitting: Cardiology

## 2017-09-07 ENCOUNTER — Encounter (HOSPITAL_COMMUNITY)
Admission: RE | Admit: 2017-09-07 | Discharge: 2017-09-07 | Disposition: A | Discharge status: Home / Self Care | Payer: Self-pay | Source: Ambulatory Visit | Attending: Cardiology | Admitting: Cardiology

## 2017-09-10 ENCOUNTER — Encounter (HOSPITAL_COMMUNITY)
Admission: RE | Admit: 2017-09-10 | Discharge: 2017-09-10 | Disposition: A | Discharge status: Home / Self Care | Payer: Self-pay | Source: Ambulatory Visit | Attending: Cardiology | Admitting: Cardiology

## 2017-09-12 ENCOUNTER — Encounter (HOSPITAL_COMMUNITY)
Admission: RE | Admit: 2017-09-12 | Discharge: 2017-09-12 | Disposition: A | Discharge status: Home / Self Care | Payer: Self-pay | Source: Ambulatory Visit | Attending: Cardiology | Admitting: Cardiology

## 2017-09-14 ENCOUNTER — Encounter (HOSPITAL_COMMUNITY)
Admission: RE | Admit: 2017-09-14 | Discharge: 2017-09-14 | Disposition: A | Discharge status: Home / Self Care | Payer: Self-pay | Source: Ambulatory Visit | Attending: Cardiology | Admitting: Cardiology

## 2017-09-17 ENCOUNTER — Encounter (HOSPITAL_COMMUNITY)
Admission: RE | Admit: 2017-09-17 | Discharge: 2017-09-17 | Disposition: A | Discharge status: Home / Self Care | Payer: Medicare Other | Source: Ambulatory Visit | Attending: Cardiology | Admitting: Cardiology

## 2017-09-19 ENCOUNTER — Encounter (HOSPITAL_COMMUNITY)
Admission: RE | Admit: 2017-09-19 | Discharge: 2017-09-19 | Disposition: A | Discharge status: Home / Self Care | Payer: Medicare Other | Source: Ambulatory Visit | Attending: Cardiology | Admitting: Cardiology

## 2017-09-21 ENCOUNTER — Ambulatory Visit (INDEPENDENT_AMBULATORY_CARE_PROVIDER_SITE_OTHER): Payer: Medicare Other | Admitting: *Deleted

## 2017-09-21 ENCOUNTER — Encounter (HOSPITAL_COMMUNITY)
Admission: RE | Admit: 2017-09-21 | Discharge: 2017-09-21 | Disposition: A | Discharge status: Home / Self Care | Payer: Medicare Other | Source: Ambulatory Visit | Attending: Cardiology | Admitting: Cardiology

## 2017-09-21 DIAGNOSIS — Z5181 Encounter for therapeutic drug level monitoring: Secondary | ICD-10-CM

## 2017-09-21 DIAGNOSIS — I480 Paroxysmal atrial fibrillation: Secondary | ICD-10-CM | POA: Diagnosis not present

## 2017-09-21 LAB — POCT INR: INR: 2.9

## 2017-09-21 NOTE — Patient Instructions (Signed)
Description   Continue taking on same dosage 1.5 tablets every day except 1 tablet on Tuesdays and  Thursdays.  Recheck INR in 6 weeks.       

## 2017-09-24 ENCOUNTER — Encounter (HOSPITAL_COMMUNITY)
Admission: RE | Admit: 2017-09-24 | Discharge: 2017-09-24 | Disposition: A | Discharge status: Home / Self Care | Payer: Self-pay | Source: Ambulatory Visit | Attending: Cardiology | Admitting: Cardiology

## 2017-09-26 ENCOUNTER — Encounter (HOSPITAL_COMMUNITY)
Admission: RE | Admit: 2017-09-26 | Discharge: 2017-09-26 | Disposition: A | Discharge status: Home / Self Care | Payer: Self-pay | Source: Ambulatory Visit | Attending: Cardiology | Admitting: Cardiology

## 2017-09-26 DIAGNOSIS — Z951 Presence of aortocoronary bypass graft: Secondary | ICD-10-CM | POA: Insufficient documentation

## 2017-09-28 ENCOUNTER — Encounter (HOSPITAL_COMMUNITY)
Admission: RE | Admit: 2017-09-28 | Discharge: 2017-09-28 | Disposition: A | Discharge status: Home / Self Care | Payer: Self-pay | Source: Ambulatory Visit | Attending: Cardiology | Admitting: Cardiology

## 2017-10-01 ENCOUNTER — Encounter (HOSPITAL_COMMUNITY)
Admission: RE | Admit: 2017-10-01 | Discharge: 2017-10-01 | Disposition: A | Discharge status: Home / Self Care | Payer: Self-pay | Source: Ambulatory Visit | Attending: Cardiology | Admitting: Cardiology

## 2017-10-03 ENCOUNTER — Encounter (HOSPITAL_COMMUNITY)
Admission: RE | Admit: 2017-10-03 | Discharge: 2017-10-03 | Disposition: A | Discharge status: Home / Self Care | Payer: Self-pay | Source: Ambulatory Visit | Attending: Cardiology | Admitting: Cardiology

## 2017-10-05 ENCOUNTER — Encounter (HOSPITAL_COMMUNITY)
Admission: RE | Admit: 2017-10-05 | Discharge: 2017-10-05 | Disposition: A | Discharge status: Home / Self Care | Payer: Self-pay | Source: Ambulatory Visit | Attending: Cardiology | Admitting: Cardiology

## 2017-10-08 ENCOUNTER — Encounter (HOSPITAL_COMMUNITY)
Admission: RE | Admit: 2017-10-08 | Discharge: 2017-10-08 | Disposition: A | Discharge status: Home / Self Care | Payer: Medicare Other | Source: Ambulatory Visit | Attending: Cardiology | Admitting: Cardiology

## 2017-10-09 ENCOUNTER — Other Ambulatory Visit: Payer: Self-pay | Admitting: Cardiology

## 2017-10-10 ENCOUNTER — Encounter (HOSPITAL_COMMUNITY)
Admission: RE | Admit: 2017-10-10 | Discharge: 2017-10-10 | Disposition: A | Discharge status: Home / Self Care | Payer: Self-pay | Source: Ambulatory Visit | Attending: Cardiology | Admitting: Cardiology

## 2017-10-12 ENCOUNTER — Encounter (HOSPITAL_COMMUNITY)
Admission: RE | Admit: 2017-10-12 | Discharge: 2017-10-12 | Disposition: A | Discharge status: Home / Self Care | Payer: Medicare Other | Source: Ambulatory Visit | Attending: Cardiology | Admitting: Cardiology

## 2017-10-15 ENCOUNTER — Encounter (HOSPITAL_COMMUNITY)
Admission: RE | Admit: 2017-10-15 | Discharge: 2017-10-15 | Disposition: A | Discharge status: Home / Self Care | Payer: Self-pay | Source: Ambulatory Visit | Attending: Cardiology | Admitting: Cardiology

## 2017-10-17 ENCOUNTER — Encounter (HOSPITAL_COMMUNITY)
Admission: RE | Admit: 2017-10-17 | Discharge: 2017-10-17 | Disposition: A | Discharge status: Home / Self Care | Payer: Self-pay | Source: Ambulatory Visit | Attending: Cardiology | Admitting: Cardiology

## 2017-10-19 ENCOUNTER — Encounter (HOSPITAL_COMMUNITY)
Admission: RE | Admit: 2017-10-19 | Discharge: 2017-10-19 | Disposition: A | Discharge status: Home / Self Care | Payer: Self-pay | Source: Ambulatory Visit | Attending: Cardiology | Admitting: Cardiology

## 2017-10-24 ENCOUNTER — Ambulatory Visit (INDEPENDENT_AMBULATORY_CARE_PROVIDER_SITE_OTHER): Payer: Medicare Other | Admitting: *Deleted

## 2017-10-24 ENCOUNTER — Encounter (HOSPITAL_COMMUNITY)
Admission: RE | Admit: 2017-10-24 | Discharge: 2017-10-24 | Disposition: A | Discharge status: Home / Self Care | Payer: Medicare Other | Source: Ambulatory Visit | Attending: Cardiology | Admitting: Cardiology

## 2017-10-24 DIAGNOSIS — I495 Sick sinus syndrome: Secondary | ICD-10-CM | POA: Diagnosis not present

## 2017-10-24 DIAGNOSIS — I255 Ischemic cardiomyopathy: Secondary | ICD-10-CM

## 2017-10-25 NOTE — Progress Notes (Signed)
Remote pacemaker transmission.   

## 2017-10-26 ENCOUNTER — Encounter (HOSPITAL_COMMUNITY)
Admission: RE | Admit: 2017-10-26 | Discharge: 2017-10-26 | Disposition: A | Discharge status: Home / Self Care | Payer: Medicare Other | Source: Ambulatory Visit | Attending: Cardiology | Admitting: Cardiology

## 2017-10-26 LAB — CUP PACEART REMOTE DEVICE CHECK
Battery Impedance: 477 Ohm
Brady Statistic AP VP Percent: 0 %
Brady Statistic AP VS Percent: 64 %
Brady Statistic AS VS Percent: 35 %
Date Time Interrogation Session: 20190529145357
Implantable Lead Implant Date: 20020926
Implantable Lead Location: 753859
Implantable Lead Model: 5076
Lead Channel Impedance Value: 526 Ohm
Lead Channel Impedance Value: 912 Ohm
Lead Channel Pacing Threshold Amplitude: 0.5 V
Lead Channel Pacing Threshold Amplitude: 1 V
Lead Channel Pacing Threshold Pulse Width: 0.4 ms
Lead Channel Setting Pacing Amplitude: 2.5 V
MDC IDC LEAD IMPLANT DT: 20020926
MDC IDC LEAD LOCATION: 753860
MDC IDC MSMT BATTERY REMAINING LONGEVITY: 96 mo
MDC IDC MSMT BATTERY VOLTAGE: 2.78 V
MDC IDC MSMT LEADCHNL RV PACING THRESHOLD PULSEWIDTH: 0.4 ms
MDC IDC PG IMPLANT DT: 20120905
MDC IDC SET LEADCHNL RA PACING AMPLITUDE: 2 V
MDC IDC SET LEADCHNL RV PACING PULSEWIDTH: 0.4 ms
MDC IDC SET LEADCHNL RV SENSING SENSITIVITY: 4 mV
MDC IDC STAT BRADY AS VP PERCENT: 0 %

## 2017-10-29 ENCOUNTER — Encounter (HOSPITAL_COMMUNITY)
Admission: RE | Admit: 2017-10-29 | Discharge: 2017-10-29 | Disposition: A | Discharge status: Home / Self Care | Payer: Self-pay | Source: Ambulatory Visit | Attending: Cardiology | Admitting: Cardiology

## 2017-10-29 DIAGNOSIS — I2510 Atherosclerotic heart disease of native coronary artery without angina pectoris: Secondary | ICD-10-CM | POA: Insufficient documentation

## 2017-10-31 ENCOUNTER — Encounter (HOSPITAL_COMMUNITY)
Admission: RE | Admit: 2017-10-31 | Discharge: 2017-10-31 | Disposition: A | Discharge status: Home / Self Care | Payer: Self-pay | Source: Ambulatory Visit | Attending: Cardiology | Admitting: Cardiology

## 2017-11-02 ENCOUNTER — Ambulatory Visit (INDEPENDENT_AMBULATORY_CARE_PROVIDER_SITE_OTHER): Payer: Medicare Other | Admitting: *Deleted

## 2017-11-02 ENCOUNTER — Encounter (HOSPITAL_COMMUNITY)
Admission: RE | Admit: 2017-11-02 | Discharge: 2017-11-02 | Disposition: A | Discharge status: Home / Self Care | Payer: Self-pay | Source: Ambulatory Visit | Attending: Cardiology | Admitting: Cardiology

## 2017-11-02 DIAGNOSIS — I480 Paroxysmal atrial fibrillation: Secondary | ICD-10-CM

## 2017-11-02 DIAGNOSIS — Z5181 Encounter for therapeutic drug level monitoring: Secondary | ICD-10-CM | POA: Diagnosis not present

## 2017-11-02 LAB — POCT INR: INR: 2.1 (ref 2.0–3.0)

## 2017-11-02 NOTE — Patient Instructions (Signed)
Description   Continue taking on same dosage 1.5 tablets every day except 1 tablet on Tuesdays and  Thursdays.  Recheck INR in 6 weeks.       

## 2017-11-05 ENCOUNTER — Encounter (HOSPITAL_COMMUNITY): Payer: Self-pay

## 2017-11-07 ENCOUNTER — Encounter (HOSPITAL_COMMUNITY)
Admission: RE | Admit: 2017-11-07 | Discharge: 2017-11-07 | Disposition: A | Discharge status: Home / Self Care | Payer: Self-pay | Source: Ambulatory Visit | Attending: Cardiology | Admitting: Cardiology

## 2017-11-09 ENCOUNTER — Encounter (HOSPITAL_COMMUNITY): Payer: Self-pay

## 2017-11-12 ENCOUNTER — Encounter (HOSPITAL_COMMUNITY): Payer: Self-pay

## 2017-11-14 ENCOUNTER — Encounter (HOSPITAL_COMMUNITY): Payer: Self-pay

## 2017-11-16 ENCOUNTER — Encounter (HOSPITAL_COMMUNITY): Payer: Self-pay

## 2017-11-19 ENCOUNTER — Encounter (HOSPITAL_COMMUNITY): Payer: Self-pay

## 2017-11-21 ENCOUNTER — Telehealth (HOSPITAL_COMMUNITY): Payer: Self-pay

## 2017-11-21 ENCOUNTER — Encounter (HOSPITAL_COMMUNITY): Payer: Self-pay

## 2017-11-21 NOTE — Telephone Encounter (Signed)
Patient called and stated he will not be attending Rehab today as he has a cold.

## 2017-11-23 ENCOUNTER — Encounter (HOSPITAL_COMMUNITY): Payer: Self-pay

## 2017-11-26 ENCOUNTER — Encounter (HOSPITAL_COMMUNITY)
Admission: RE | Admit: 2017-11-26 | Discharge: 2017-11-26 | Disposition: A | Discharge status: Home / Self Care | Payer: Self-pay | Source: Ambulatory Visit | Attending: Cardiology | Admitting: Cardiology

## 2017-11-26 DIAGNOSIS — I2510 Atherosclerotic heart disease of native coronary artery without angina pectoris: Secondary | ICD-10-CM | POA: Insufficient documentation

## 2017-11-28 ENCOUNTER — Encounter (HOSPITAL_COMMUNITY): Payer: Self-pay

## 2017-11-30 ENCOUNTER — Encounter (HOSPITAL_COMMUNITY): Payer: Self-pay

## 2017-12-03 ENCOUNTER — Encounter (HOSPITAL_COMMUNITY): Payer: Self-pay

## 2017-12-05 ENCOUNTER — Other Ambulatory Visit: Payer: Self-pay | Admitting: Cardiology

## 2017-12-05 ENCOUNTER — Other Ambulatory Visit: Payer: Self-pay | Admitting: Physician Assistant

## 2017-12-05 ENCOUNTER — Encounter (HOSPITAL_COMMUNITY): Payer: Self-pay

## 2017-12-06 ENCOUNTER — Encounter: Payer: Self-pay | Admitting: Physician Assistant

## 2017-12-07 ENCOUNTER — Encounter (HOSPITAL_COMMUNITY)
Admission: RE | Admit: 2017-12-07 | Discharge: 2017-12-07 | Disposition: A | Discharge status: Home / Self Care | Payer: Self-pay | Source: Ambulatory Visit | Attending: Cardiology | Admitting: Cardiology

## 2017-12-10 ENCOUNTER — Encounter (HOSPITAL_COMMUNITY)
Admission: RE | Admit: 2017-12-10 | Discharge: 2017-12-10 | Disposition: A | Discharge status: Home / Self Care | Payer: Medicare Other | Source: Ambulatory Visit | Attending: Cardiology | Admitting: Cardiology

## 2017-12-12 ENCOUNTER — Encounter (HOSPITAL_COMMUNITY)
Admission: RE | Admit: 2017-12-12 | Discharge: 2017-12-12 | Disposition: A | Discharge status: Home / Self Care | Payer: Self-pay | Source: Ambulatory Visit | Attending: Cardiology | Admitting: Cardiology

## 2017-12-14 ENCOUNTER — Ambulatory Visit (INDEPENDENT_AMBULATORY_CARE_PROVIDER_SITE_OTHER): Payer: Medicare Other | Admitting: Pharmacist

## 2017-12-14 ENCOUNTER — Encounter (HOSPITAL_COMMUNITY)
Admission: RE | Admit: 2017-12-14 | Discharge: 2017-12-14 | Disposition: A | Discharge status: Home / Self Care | Payer: Self-pay | Source: Ambulatory Visit | Attending: Cardiology | Admitting: Cardiology

## 2017-12-14 DIAGNOSIS — I480 Paroxysmal atrial fibrillation: Secondary | ICD-10-CM

## 2017-12-14 DIAGNOSIS — Z5181 Encounter for therapeutic drug level monitoring: Secondary | ICD-10-CM | POA: Diagnosis not present

## 2017-12-14 LAB — POCT INR: INR: 2.8 (ref 2.0–3.0)

## 2017-12-14 NOTE — Patient Instructions (Signed)
Description   Continue taking on same dosage 1.5 tablets every day except 1 tablet on Tuesdays and  Thursdays.  Recheck INR in 6 weeks.       

## 2017-12-17 ENCOUNTER — Encounter (HOSPITAL_COMMUNITY)
Admission: RE | Admit: 2017-12-17 | Discharge: 2017-12-17 | Disposition: A | Discharge status: Home / Self Care | Payer: Medicare Other | Source: Ambulatory Visit | Attending: Cardiology | Admitting: Cardiology

## 2017-12-19 ENCOUNTER — Encounter (HOSPITAL_COMMUNITY)
Admission: RE | Admit: 2017-12-19 | Discharge: 2017-12-19 | Disposition: A | Discharge status: Home / Self Care | Payer: Self-pay | Source: Ambulatory Visit | Attending: Cardiology | Admitting: Cardiology

## 2017-12-21 ENCOUNTER — Encounter (HOSPITAL_COMMUNITY)
Admission: RE | Admit: 2017-12-21 | Discharge: 2017-12-21 | Disposition: A | Discharge status: Home / Self Care | Payer: Self-pay | Source: Ambulatory Visit | Attending: Cardiology | Admitting: Cardiology

## 2017-12-24 ENCOUNTER — Encounter (HOSPITAL_COMMUNITY)
Admission: RE | Admit: 2017-12-24 | Discharge: 2017-12-24 | Disposition: A | Discharge status: Home / Self Care | Payer: Self-pay | Source: Ambulatory Visit | Attending: Cardiology | Admitting: Cardiology

## 2017-12-25 ENCOUNTER — Ambulatory Visit: Payer: Medicare Other | Admitting: Physician Assistant

## 2017-12-25 ENCOUNTER — Encounter: Payer: Self-pay | Admitting: Physician Assistant

## 2017-12-25 VITALS — BP 104/64 | HR 71 | Ht 67.0 in | Wt 243.0 lb

## 2017-12-25 DIAGNOSIS — I2510 Atherosclerotic heart disease of native coronary artery without angina pectoris: Secondary | ICD-10-CM | POA: Diagnosis not present

## 2017-12-25 DIAGNOSIS — I350 Nonrheumatic aortic (valve) stenosis: Secondary | ICD-10-CM

## 2017-12-25 DIAGNOSIS — I480 Paroxysmal atrial fibrillation: Secondary | ICD-10-CM | POA: Diagnosis not present

## 2017-12-25 DIAGNOSIS — I6523 Occlusion and stenosis of bilateral carotid arteries: Secondary | ICD-10-CM

## 2017-12-25 DIAGNOSIS — I10 Essential (primary) hypertension: Secondary | ICD-10-CM

## 2017-12-25 DIAGNOSIS — I5042 Chronic combined systolic (congestive) and diastolic (congestive) heart failure: Secondary | ICD-10-CM | POA: Diagnosis not present

## 2017-12-25 NOTE — Patient Instructions (Addendum)
Medication Instructions: Your physician recommends that you continue on your current medications as directed. Please refer to the Current Medication list given to you today.   Labwork: None Ordered  Procedures/Testing: You are scheduled to have an Echocardiogram on 02/12/18 at 1:00 PM  Your physician has requested that you have a carotid duplex in January of 2020. This test is an ultrasound of the carotid arteries in your neck. It looks at blood flow through these arteries that supply the brain with blood. Allow one hour for this exam. There are no restrictions or special instructions.   Follow-Up: Your physician wants you to follow-up in: 1 year with Dr.Turner You will receive a reminder letter in the mail two months in advance. If you don't receive a letter, please call our office to schedule the follow-up appointment.   Any Additional Special Instructions Will Be Listed Below (If Applicable).     If you need a refill on your cardiac medications before your next appointment, please call your pharmacy.

## 2017-12-25 NOTE — Progress Notes (Signed)
Cardiology Office Note    Date:  12/25/2017   ID:  Brandon Klein, DOB [birth year], MRN [id number]  PCP:  Juluis Rainier, MD  Cardiologist: Armanda Magic, MD  Chief Complaint  Patient presents with  . Follow-up    History of Present Illness:  Brandon Klein is a 77 y.o. male with history of CAD status post stenting to the LAD and PTCA of the circumflex in the past followed by CABG x3 in 2015 with a LIMA to the LAD, SVG to the diagonal, SVG to circumflex and SVG to PDA.  Normal LV function at that time but then developed severe LV dysfunction post CABG.  Most recent cath 06/2014 occluded SVG to the PDA of other grafts patent EF was felt to be secondary to nonischemic cardiomyopathy possibly worsening aortic stenosis.  Also has PAF on Coumadin for CHA2DS2-VASc of 6, bradycardia status post permanent pacemaker, moderate to severe aortic stenosis, hypertension, HLD.  TEE 08/2014 showed moderate aortic stenosis EF 20 to 25%.  Follow-up with Dr. Graciela Husbands with repeat echo showed improvement in LV function EF 35 to 40% and ICD was not indicated.  He was noted to have NSVT on pacer check and underwent stress testing that showed no ischemia repeat echo 02/2017 EF 45 to 50%.  Patient last saw Dr. [name] 06/26/2017 which time she recommended increasing his Lipitor to 80 mg daily.  He is also on fish oil and scheduled to have labs checked by Dr. Lawerance Bach in October.  He does maintenance cardiac rehab 3 days a week at [facility] and golfs 1 day a week.  He had a recent pulmonary infection that lasted over 5 weeks that he still recovering from.  He denies any chest pain, dyspnea other than pulmonary infection, dizziness or presyncope.  Scheduled for echo in September.   Past Medical History:  Diagnosis Date  . AS (aortic stenosis)    moderate AS 2018  . Carotid artery plaque    a. Duplex 10/2013: mild calcific plaque origin ICA. Left: intimal wall thickening CCA. 0-39% BICA.  Marland Kitchen Chronic renal insufficiency   . Chronic  systolic dysfunction of left ventricle   . Coronary artery disease    a. s/p PCI of LAD 1997. b. Cutting balloon angioplasty to LCx in 2002. c. s/p cath 11/2013 with severe 3 vessel ASCAD s/p CABG with LIMA to LAD, SVG to diag, SVG to left circ and SVG to PDA. d. cath 07/07/2014 occluded SVG to PDA, all other grafts patent. 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 DJD (degenerative joint disease)   . Epistaxis   . GERD (gastroesophageal reflux disease)   . GI bleeding    a.  with benign gastric polypectomy 05/2014.  Marland Kitchen HLD (hyperlipidemia)   . HTN (hypertension)   . Hypokalemia   . Hypomagnesemia   . Impaired fasting glucose    A1C check every year  . Obesity   . OSA (obstructive sleep apnea)   . Pacemaker    a. 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 b. MDT generator replacement 2012.  Marland Kitchen PAF (paroxysmal atrial fibrillation) (HCC)    on chronic systemic anticoagulation  . PVC's (premature ventricular contractions)   . Syncope    a. vasovagal with documented bradycardia.  . Type 2 diabetes mellitus without complications (HCC) 12/02/2015    Past Surgical History:  Procedure Laterality Date  . ANGIOPLASTY  1997  . CHOLECYSTECTOMY    . COLONOSCOPY WITH PROPOFOL N/A 05/05/2014   Procedure:  COLONOSCOPY WITH PROPOFOL;  Surgeon: Charolett Bumpers, MD;  Location: [facility] ENDOSCOPY;  Service: Endoscopy;  Laterality: N/A;  . CORONARY ARTERY BYPASS GRAFT N/A 12/01/2013   Procedure: CORONARY ARTERY BYPASS GRAFT TIMES FOUR USING LEFT INTERNAL MAMMARY ARTERY TO LAD, SAPHENOUS VEIN GRAFTS TO DIAGONAL, CIRCUMFELX AND POSTERIOR DESCENDING;  Surgeon: Kerin Perna, MD;  Location: [facility] OR;  Service: Open Heart Surgery;  Laterality: N/A;  . ESOPHAGOGASTRODUODENOSCOPY (EGD) WITH PROPOFOL N/A 05/05/2014   Procedure: ESOPHAGOGASTRODUODENOSCOPY (EGD) WITH PROPOFOL;  Surgeon: Charolett Bumpers, MD;  Location: [facility] ENDOSCOPY;  Service: Endoscopy;  Laterality: N/A;  . KNEE ARTHROSCOPY  2000   Left  . LEFT AND RIGHT  HEART CATHETERIZATION WITH CORONARY/GRAFT ANGIOGRAM N/A 07/07/2014   Procedure: LEFT AND RIGHT HEART CATHETERIZATION WITH Isabel Caprice;  Surgeon: Kathleene Hazel, MD;  Location[facility] CATH LAB;  Service: Cardiovascular;  Laterality: N/A;  . LEFT HEART CATHETERIZATION WITH CORONARY ANGIOGRAM N/A 11/25/2013   Procedure: LEFT HEART CATHETERIZATION WITH CORONARY ANGIOGRAM;  Surgeon: [name], MD;  Location: [facility] CATH LAB;  Service: Cardiovascular;  Laterality: N/A;  . PACEMAKER INSERTION  2002   generator change MDT by Dr Graciela Husbands in 2012  . TEE WITHOUT CARDIOVERSION N/A 08/31/2014   Procedure: TRANSESOPHAGEAL ECHOCARDIOGRAM (TEE);  Surgeon: [name], MD;  Location: [facility] ENDOSCOPY;  Service: Cardiovascular;  Laterality: N/A;    Current Medications: Current Meds  Medication Sig  . aspirin EC 81 MG EC tablet Take 1 tablet (81 mg total) by mouth daily. (Patient taking differently: Take 81 mg by mouth every morning. )  . atorvastatin (LIPITOR) 80 MG tablet Take 1 tablet (80 mg total) by mouth daily.  . carvedilol (COREG) 25 MG tablet TAKE 1 TABLET(25 MG) BY MOUTH TWICE DAILY WITH A MEAL  . Cholecalciferol (VITAMIN D) 2000 UNITS tablet Take 2,000 Units by mouth daily.  . Cyanocobalamin (VITAMIN B-12 PO) Take 1 tablet by mouth every morning.  . docusate sodium (COLACE) 100 MG capsule Take 100 mg by mouth 2 (two) times daily as needed for mild constipation.  . fexofenadine (ALLEGRA) 180 MG tablet Take 180 mg by mouth every morning.   . fluticasone (FLONASE) 50 MCG/ACT nasal spray Place 2 sprays into the nose every morning.   . furosemide (LASIX) 20 MG tablet TAKE 1 TABLET BY MOUTH TWICE DAILY WITH 80 MG TO TOTAL 100 MG  . furosemide (LASIX) 80 MG tablet TAKE 1 TABLET BY MOUTH TWICE DAILY. TAKE IT WITH 20MG  TABLETS  . Glucosamine-Chondroit-Vit C-Mn (GLUCOSAMINE CHONDR 1500 COMPLX) CAPS Take 1 capsule by mouth every morning.   . hydrALAZINE (APRESOLINE) 25 MG tablet TAKE 1 TABLET(25 MG) BY  MOUTH THREE TIMES DAILY  . isosorbide mononitrate (IMDUR) 30 MG 24 hr tablet TAKE 1 TABLET BY MOUTH EVERY DAY  . L-Lysine 500 MG CAPS Take 1 capsule by mouth every morning.   . meclizine (ANTIVERT) 25 MG tablet Take 25 mg by mouth daily as needed for dizziness.   . Multiple Vitamin (MULTIVITAMIN) tablet Take 1 tablet by mouth every morning.   . nitroGLYCERIN (NITROSTAT) 0.4 MG SL tablet Place 1 tablet (0.4 mg total) under the tongue every 5 (five) minutes as needed for chest pain. (Patient taking differently: Place 0.4 mg under the tongue every 5 (five) minutes as needed for chest pain (MAX 3 TABLETS). )  . Omega-3 Fatty Acids (FISH OIL) 1000 MG CPDR Take 2 capsules by mouth 2 (two) times daily.  . ONE TOUCH ULTRA TEST test strip 1 each by  Other route every morning.   Letta Pate DELICA LANCETS FINE MISC 1 each by Other route every morning.   . pantoprazole (PROTONIX) 40 MG tablet Take 40 mg by mouth every morning.   . potassium chloride SA (K-DUR,KLOR-CON) 20 MEQ tablet TAKE 2 TABLETS BY MOUTH TWICE DAILY  . RA NATURAL MAGNESIUM 250 MG TABS TAKE 2 TABLETS TWICE DAILY.  Marland Kitchen spironolactone (ALDACTONE) 25 MG tablet TAKE 1 TABLET(25 MG) BY MOUTH DAILY  . warfarin (COUMADIN) 5 MG tablet USE AS DIRECTED BY COUMADIN CLINIC     Allergies:   Acetaminophen-codeine; Amoxicillin; and Diovan [valsartan]   Social History   Socioeconomic History  . Marital status: Married    Spouse name: Not on file  . Number of children: Not on file  . Years of education: Not on file  . Highest education level: Not on file  Occupational History  . Not on file  Social Needs  . Financial resource strain: Not on file  . Food insecurity:    Worry: Not on file    Inability: Not on file  . Transportation needs:    Medical: Not on file    Non-medical: Not on file  Tobacco Use  . Smoking status: Former Smoker    Packs/day: 2.00    Years: 5.00    Pack years: 10.00    Types: Cigarettes    Last attempt to quit:  05/29/1966    Years since quitting: 51.6  . Smokeless tobacco: Never Used  Substance and Sexual Activity  . Alcohol use: Yes    Alcohol/week: 0.0 oz    Comment: ocassional  . Drug use: No  . Sexual activity: Not on file  Lifestyle  . Physical activity:    Days per week: Not on file    Minutes per session: Not on file  . Stress: Not on file  Relationships  . Social connections:    Talks on phone: Not on file    Gets together: Not on file    Attends religious service: Not on file    Active member of club or organization: Not on file    Attends meetings of clubs or organizations: Not on file    Relationship status: Not on file  Other Topics Concern  . Not on file  Social History Narrative   Lives in [place] with spouse.  Retired Medical illustrator     Family History:  The patient's family history includes Colon cancer in his mother; Heart attack in his brother and mother; Leukemia in his father.   ROS:   Please see the history of present illness.    Review of Systems  Constitution: Negative.  HENT: Negative.   Cardiovascular: Negative.   Respiratory: Positive for cough.   Endocrine: Negative.   Hematologic/Lymphatic: Negative.   Musculoskeletal: Negative.   Gastrointestinal: Negative.   Genitourinary: Negative.   Neurological: Negative.    All other systems reviewed and are negative.   PHYSICAL EXAM:   VS:  BP 104/64   Pulse 71   Ht 5\' 7"  (1.702 m)   Wt 243 lb (110.2 kg)   BMI 38.06 kg/m   Physical Exam  GEN: Obese, in no acute distress  Neck: no JVD, carotid bruits, or masses Cardiac:RRR; distant heart sounds, 1/6 systolic murmur at left sternal border Respiratory:  clear to auscultation bilaterally, normal work of breathing GI: soft, nontender, nondistended, + BS Ext: without cyanosis, clubbing, or edema, Good distal pulses bilaterally Neuro:  Alert and Oriented x 3 Psych: euthymic mood,  full affect  Wt Readings from Last 3 Encounters:  12/25/17 243 lb (110.2 kg)   06/26/17 244 lb (110.7 kg)  06/12/17 247 lb (112 kg)      Studies/Labs Reviewed:   EKG:  EKG is  ordered today.  The ekg ordered today demonstrates atrial paced rhythm with bifascicular block  Recent Labs: 06/26/2017: BUN 24; Creatinine, Ser 1.40; Potassium 4.8; Sodium 141 08/07/2017: ALT 17   Lipid Panel    Component Value Date/Time   CHOL 117 08/07/2017 1200   CHOL 113 07/22/2014 1226   TRIG 183 (H) 08/07/2017 1200   TRIG 91 07/22/2014 1226   HDL 39 (L) 08/07/2017 1200   HDL 40 07/22/2014 1226   CHOLHDL 3.0 08/07/2017 1200   CHOLHDL 3.4 06/04/2015 1216   VLDL 39 (H) 06/04/2015 1216   LDLCALC 41 08/07/2017 1200   LDLCALC 55 07/22/2014 1226   LDLDIRECT 47.9 07/14/2013 0937    Additional studies/ records that were reviewed today include:  2D echo 9/2018Study Conclusions   - Left ventricle: Abnormal septal motion mid and basal inferior   wall hypokinesis. Systolic function was mildly reduced. The   estimated ejection fraction was in the range of 45% to 50%.   Doppler parameters are consistent with abnormal left ventricular   relaxation (grade 1 diastolic dysfunction). - Aortic valve: Gradients actually appear lower than those recorded   on 02/07/16. There was moderate stenosis. Valve area (VTI): 0.74   cm^2. Valve area (Vmax): 0.7 cm^2. Valve area (Vmean): 0.74 cm^2. - Left atrium: The atrium was moderately dilated. - Atrial septum: No defect or patent foramen ovale was identified.    Nuclear stress test 10/2018Study Highlights      Nuclear stress EF: 42%. Ejection fraction on echocardiogram was 45%.  There was no ST segment deviation noted during stress.  This is a low risk study. No perfusion defects identified. Ejection fraction is mildly reduced, consistent with echocardiogram.   Donato Schultz, MD     Cardiac catheterization 2016Left Ventricular Angiogram: Deferred   Impression: 1. 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  Moderately severe distal RCA stenosis, this vessel is no longer protected by the graft 3. Ischemic cardiomyopathy 4. Moderate aortic stenosis although severity may be underestimated given low LVEF   Recommendations: He appears to remain volume overloaded. I would resume diuresis today. I cannot explain the drop in his LVEF by the occlusion of the graft to the PDA. His native RCA has moderately severe distal disease but good flow. This could be easily approached with PCI in the future. For now, would not recommend PCI of the RCA.        Complications:  None; patient tolerated the procedure well.            ASSESSMENT:    1. Coronary artery disease involving native coronary artery of native heart without angina pectoris   2. PAF (paroxysmal atrial fibrillation) (HCC)   3. Aortic valve stenosis, etiology of cardiac valve disease unspecified   4. Chronic combined systolic and diastolic CHF (congestive heart failure) (HCC)   5. Essential hypertension   6. Atherosclerosis of both carotid arteries      PLAN:  In order of problems listed above:  CAD with prior stenting of the LAD and PTCA of the circumflex followed by CABG x4 in 2015, last cath 2016 with 3/4 patent bypass grafts moderate severe distal RCA stenosis no longer protected by the graft, ischemic cardiomyopathy.  No angina.  Working with cardiac rehab in their maintenance program.   PAF CHA2DS2-VASc equals 6 on Coumadin  Aortic valve stenosis moderate on last echo-scheduled for repeat in September.  Asymptomatic  Chronic combined systolic and diastolic CHF EF 45 to 50% echo 01/2017 scheduled for repeat this year.  No evidence of CHF on exam.  Essential hypertension blood pressure well controlled  Bilateral carotid artery stenosis Dopplers 05/2016 1 to 39% stenosis for repeat 05/2018.    Medication Adjustments/Labs and Tests Ordered: Current medicines are reviewed at length with the patient today.  Concerns regarding medicines  are outlined above.  Medication changes, Labs and Tests ordered today are listed in the Patient Instructions below. There are no Patient Instructions on file for this visit.   Elson Clan, PA-C  12/25/2017 12:57 PM    [facility] [address] [place], [place], [place]  [address] Phone: [phone]; Fax: [phone]

## 2017-12-26 ENCOUNTER — Encounter

## 2017-12-26 ENCOUNTER — Encounter (HOSPITAL_COMMUNITY)
Admission: RE | Admit: 2017-12-26 | Discharge: 2017-12-26 | Disposition: A | Discharge status: Home / Self Care | Payer: Self-pay | Source: Ambulatory Visit | Attending: Cardiology | Admitting: Cardiology

## 2017-12-28 ENCOUNTER — Encounter (HOSPITAL_COMMUNITY)
Admission: RE | Admit: 2017-12-28 | Discharge: 2017-12-28 | Disposition: A | Discharge status: Home / Self Care | Payer: Self-pay | Source: Ambulatory Visit | Attending: Cardiology | Admitting: Cardiology

## 2017-12-28 DIAGNOSIS — I2510 Atherosclerotic heart disease of native coronary artery without angina pectoris: Secondary | ICD-10-CM | POA: Insufficient documentation

## 2017-12-31 ENCOUNTER — Encounter (HOSPITAL_COMMUNITY)
Admission: RE | Admit: 2017-12-31 | Discharge: 2017-12-31 | Disposition: A | Discharge status: Home / Self Care | Payer: Self-pay | Source: Ambulatory Visit | Attending: Cardiology | Admitting: Cardiology

## 2018-01-02 ENCOUNTER — Encounter (HOSPITAL_COMMUNITY)
Admission: RE | Admit: 2018-01-02 | Discharge: 2018-01-02 | Disposition: A | Discharge status: Home / Self Care | Payer: Self-pay | Source: Ambulatory Visit | Attending: Cardiology | Admitting: Cardiology

## 2018-01-04 ENCOUNTER — Encounter (HOSPITAL_COMMUNITY)
Admission: RE | Admit: 2018-01-04 | Discharge: 2018-01-04 | Disposition: A | Discharge status: Home / Self Care | Payer: Self-pay | Source: Ambulatory Visit | Attending: Cardiology | Admitting: Cardiology

## 2018-01-07 ENCOUNTER — Encounter (HOSPITAL_COMMUNITY)
Admission: RE | Admit: 2018-01-07 | Discharge: 2018-01-07 | Disposition: A | Discharge status: Home / Self Care | Payer: Self-pay | Source: Ambulatory Visit | Attending: Cardiology | Admitting: Cardiology

## 2018-01-09 ENCOUNTER — Encounter (HOSPITAL_COMMUNITY)
Admission: RE | Admit: 2018-01-09 | Discharge: 2018-01-09 | Disposition: A | Discharge status: Home / Self Care | Payer: Self-pay | Source: Ambulatory Visit | Attending: Cardiology | Admitting: Cardiology

## 2018-01-11 ENCOUNTER — Encounter (HOSPITAL_COMMUNITY)
Admission: RE | Admit: 2018-01-11 | Discharge: 2018-01-11 | Disposition: A | Discharge status: Home / Self Care | Payer: Self-pay | Source: Ambulatory Visit | Attending: Cardiology | Admitting: Cardiology

## 2018-01-14 ENCOUNTER — Encounter (HOSPITAL_COMMUNITY)
Admission: RE | Admit: 2018-01-14 | Discharge: 2018-01-14 | Disposition: A | Discharge status: Home / Self Care | Payer: Self-pay | Source: Ambulatory Visit | Attending: Cardiology | Admitting: Cardiology

## 2018-01-16 ENCOUNTER — Encounter (HOSPITAL_COMMUNITY)
Admission: RE | Admit: 2018-01-16 | Discharge: 2018-01-16 | Disposition: A | Discharge status: Home / Self Care | Payer: Self-pay | Source: Ambulatory Visit | Attending: Cardiology | Admitting: Cardiology

## 2018-01-18 ENCOUNTER — Encounter (HOSPITAL_COMMUNITY): Payer: Self-pay

## 2018-01-18 ENCOUNTER — Encounter (HOSPITAL_COMMUNITY)
Admission: RE | Admit: 2018-01-18 | Discharge: 2018-01-18 | Disposition: A | Discharge status: Home / Self Care | Payer: Self-pay | Source: Ambulatory Visit | Attending: Cardiology | Admitting: Cardiology

## 2018-01-21 ENCOUNTER — Encounter (HOSPITAL_COMMUNITY)
Admission: RE | Admit: 2018-01-21 | Discharge: 2018-01-21 | Disposition: A | Discharge status: Home / Self Care | Payer: Self-pay | Source: Ambulatory Visit | Attending: Cardiology | Admitting: Cardiology

## 2018-01-23 ENCOUNTER — Encounter (HOSPITAL_COMMUNITY)
Admission: RE | Admit: 2018-01-23 | Discharge: 2018-01-23 | Disposition: A | Discharge status: Home / Self Care | Payer: Self-pay | Source: Ambulatory Visit | Attending: Cardiology | Admitting: Cardiology

## 2018-01-24 ENCOUNTER — Encounter: Payer: Medicare Other | Admitting: *Deleted

## 2018-01-24 ENCOUNTER — Telehealth: Payer: Self-pay

## 2018-01-24 NOTE — Telephone Encounter (Signed)
Pt called having difficulties sending his remote transmission. I was unsuccessful in troubleshooting his monitor. I gave him the number to [employer].

## 2018-01-25 ENCOUNTER — Encounter: Payer: Self-pay | Admitting: Cardiology

## 2018-01-25 ENCOUNTER — Ambulatory Visit: Payer: Medicare Other | Admitting: *Deleted

## 2018-01-25 ENCOUNTER — Telehealth: Payer: Self-pay

## 2018-01-25 ENCOUNTER — Encounter (HOSPITAL_COMMUNITY)
Admission: RE | Admit: 2018-01-25 | Discharge: 2018-01-25 | Disposition: A | Discharge status: Home / Self Care | Payer: Self-pay | Source: Ambulatory Visit | Attending: Cardiology | Admitting: Cardiology

## 2018-01-25 DIAGNOSIS — Z5181 Encounter for therapeutic drug level monitoring: Secondary | ICD-10-CM | POA: Diagnosis not present

## 2018-01-25 DIAGNOSIS — I480 Paroxysmal atrial fibrillation: Secondary | ICD-10-CM | POA: Diagnosis not present

## 2018-01-25 LAB — POCT INR: INR: 3 (ref 2.0–3.0)

## 2018-01-25 NOTE — Telephone Encounter (Signed)
Pt stated that someone in [employer] told him he was not registered. I Called [employer] and they told me he is registered. I gave him the [phone] [employer] number so he can get help with his monitor.

## 2018-01-25 NOTE — Patient Instructions (Signed)
Description   Continue taking on same dosage 1.5 tablets every day except 1 tablet on Tuesdays and  Thursdays.  Recheck INR in 6 weeks.       

## 2018-01-30 ENCOUNTER — Encounter (HOSPITAL_COMMUNITY)
Admission: RE | Admit: 2018-01-30 | Discharge: 2018-01-30 | Disposition: A | Discharge status: Home / Self Care | Payer: Self-pay | Source: Ambulatory Visit | Attending: Cardiology | Admitting: Cardiology

## 2018-01-30 DIAGNOSIS — I2510 Atherosclerotic heart disease of native coronary artery without angina pectoris: Secondary | ICD-10-CM | POA: Insufficient documentation

## 2018-02-01 ENCOUNTER — Encounter (HOSPITAL_COMMUNITY)
Admission: RE | Admit: 2018-02-01 | Discharge: 2018-02-01 | Disposition: A | Discharge status: Home / Self Care | Payer: Self-pay | Source: Ambulatory Visit | Attending: Cardiology | Admitting: Cardiology

## 2018-02-04 ENCOUNTER — Encounter: Payer: Self-pay | Admitting: Cardiology

## 2018-02-04 ENCOUNTER — Ambulatory Visit (INDEPENDENT_AMBULATORY_CARE_PROVIDER_SITE_OTHER): Payer: Medicare Other | Admitting: *Deleted

## 2018-02-04 ENCOUNTER — Encounter (HOSPITAL_COMMUNITY)
Admission: RE | Admit: 2018-02-04 | Discharge: 2018-02-04 | Disposition: A | Discharge status: Home / Self Care | Payer: Medicare Other | Source: Ambulatory Visit | Attending: Cardiology | Admitting: Cardiology

## 2018-02-04 DIAGNOSIS — I495 Sick sinus syndrome: Secondary | ICD-10-CM | POA: Diagnosis not present

## 2018-02-04 NOTE — Progress Notes (Signed)
Remote pacemaker transmission.   

## 2018-02-06 ENCOUNTER — Encounter (HOSPITAL_COMMUNITY)
Admission: RE | Admit: 2018-02-06 | Discharge: 2018-02-06 | Disposition: A | Discharge status: Home / Self Care | Payer: Self-pay | Source: Ambulatory Visit | Attending: Cardiology | Admitting: Cardiology

## 2018-02-08 ENCOUNTER — Encounter (HOSPITAL_COMMUNITY)
Admission: RE | Admit: 2018-02-08 | Discharge: 2018-02-08 | Disposition: A | Discharge status: Home / Self Care | Payer: Self-pay | Source: Ambulatory Visit | Attending: Cardiology | Admitting: Cardiology

## 2018-02-11 ENCOUNTER — Encounter (HOSPITAL_COMMUNITY)
Admission: RE | Admit: 2018-02-11 | Discharge: 2018-02-11 | Disposition: A | Discharge status: Home / Self Care | Payer: Self-pay | Source: Ambulatory Visit | Attending: Cardiology | Admitting: Cardiology

## 2018-02-12 ENCOUNTER — Other Ambulatory Visit: Payer: Self-pay

## 2018-02-12 ENCOUNTER — Ambulatory Visit (HOSPITAL_COMMUNITY): Payer: Medicare Other | Attending: Cardiology

## 2018-02-12 DIAGNOSIS — E785 Hyperlipidemia, unspecified: Secondary | ICD-10-CM | POA: Diagnosis not present

## 2018-02-12 DIAGNOSIS — I4891 Unspecified atrial fibrillation: Secondary | ICD-10-CM | POA: Insufficient documentation

## 2018-02-12 DIAGNOSIS — Z6838 Body mass index (BMI) 38.0-38.9, adult: Secondary | ICD-10-CM | POA: Insufficient documentation

## 2018-02-12 DIAGNOSIS — Z87891 Personal history of nicotine dependence: Secondary | ICD-10-CM | POA: Insufficient documentation

## 2018-02-12 DIAGNOSIS — G4733 Obstructive sleep apnea (adult) (pediatric): Secondary | ICD-10-CM | POA: Diagnosis not present

## 2018-02-12 DIAGNOSIS — D649 Anemia, unspecified: Secondary | ICD-10-CM | POA: Diagnosis not present

## 2018-02-12 DIAGNOSIS — R001 Bradycardia, unspecified: Secondary | ICD-10-CM | POA: Insufficient documentation

## 2018-02-12 DIAGNOSIS — Z950 Presence of cardiac pacemaker: Secondary | ICD-10-CM | POA: Diagnosis not present

## 2018-02-12 DIAGNOSIS — I350 Nonrheumatic aortic (valve) stenosis: Secondary | ICD-10-CM

## 2018-02-12 DIAGNOSIS — I110 Hypertensive heart disease with heart failure: Secondary | ICD-10-CM | POA: Diagnosis not present

## 2018-02-12 DIAGNOSIS — I2510 Atherosclerotic heart disease of native coronary artery without angina pectoris: Secondary | ICD-10-CM | POA: Diagnosis not present

## 2018-02-12 DIAGNOSIS — I509 Heart failure, unspecified: Secondary | ICD-10-CM | POA: Insufficient documentation

## 2018-02-12 DIAGNOSIS — E119 Type 2 diabetes mellitus without complications: Secondary | ICD-10-CM | POA: Insufficient documentation

## 2018-02-12 DIAGNOSIS — E669 Obesity, unspecified: Secondary | ICD-10-CM | POA: Diagnosis not present

## 2018-02-12 DIAGNOSIS — I080 Rheumatic disorders of both mitral and aortic valves: Secondary | ICD-10-CM | POA: Insufficient documentation

## 2018-02-12 DIAGNOSIS — R55 Syncope and collapse: Secondary | ICD-10-CM | POA: Insufficient documentation

## 2018-02-13 ENCOUNTER — Encounter (HOSPITAL_COMMUNITY)
Admission: RE | Admit: 2018-02-13 | Discharge: 2018-02-13 | Disposition: A | Discharge status: Home / Self Care | Payer: Self-pay | Source: Ambulatory Visit | Attending: Cardiology | Admitting: Cardiology

## 2018-02-15 ENCOUNTER — Encounter (HOSPITAL_COMMUNITY)
Admission: RE | Admit: 2018-02-15 | Discharge: 2018-02-15 | Disposition: A | Discharge status: Home / Self Care | Payer: Medicare Other | Source: Ambulatory Visit | Attending: Cardiology | Admitting: Cardiology

## 2018-02-18 ENCOUNTER — Encounter (HOSPITAL_COMMUNITY)
Admission: RE | Admit: 2018-02-18 | Discharge: 2018-02-18 | Disposition: A | Discharge status: Home / Self Care | Payer: Medicare Other | Source: Ambulatory Visit | Attending: Cardiology | Admitting: Cardiology

## 2018-02-20 ENCOUNTER — Encounter (HOSPITAL_COMMUNITY)
Admission: RE | Admit: 2018-02-20 | Discharge: 2018-02-20 | Disposition: A | Discharge status: Home / Self Care | Payer: Self-pay | Source: Ambulatory Visit | Attending: Cardiology | Admitting: Cardiology

## 2018-02-22 ENCOUNTER — Encounter (HOSPITAL_COMMUNITY)
Admission: RE | Admit: 2018-02-22 | Discharge: 2018-02-22 | Disposition: A | Discharge status: Home / Self Care | Payer: Self-pay | Source: Ambulatory Visit | Attending: Cardiology | Admitting: Cardiology

## 2018-02-23 LAB — CUP PACEART REMOTE DEVICE CHECK
Battery Impedance: 551 Ohm
Battery Voltage: 2.78 V
Brady Statistic AP VP Percent: 0 %
Brady Statistic AS VS Percent: 33 %
Date Time Interrogation Session: 20190908163640
Implantable Lead Implant Date: 20020926
Implantable Lead Location: 753859
Implantable Lead Location: 753860
Implantable Lead Model: 5076
Implantable Lead Model: 5092
Implantable Pulse Generator Implant Date: 20120905
Lead Channel Pacing Threshold Amplitude: 0.5 V
Lead Channel Pacing Threshold Pulse Width: 0.4 ms
Lead Channel Sensing Intrinsic Amplitude: 1 mV
Lead Channel Sensing Intrinsic Amplitude: 8 mV
Lead Channel Setting Pacing Pulse Width: 0.4 ms
MDC IDC LEAD IMPLANT DT: 20020926
MDC IDC MSMT BATTERY REMAINING LONGEVITY: 89 mo
MDC IDC MSMT LEADCHNL RA IMPEDANCE VALUE: 510 Ohm
MDC IDC MSMT LEADCHNL RV IMPEDANCE VALUE: 938 Ohm
MDC IDC MSMT LEADCHNL RV PACING THRESHOLD AMPLITUDE: 1 V
MDC IDC MSMT LEADCHNL RV PACING THRESHOLD PULSEWIDTH: 0.4 ms
MDC IDC SET LEADCHNL RA PACING AMPLITUDE: 2 V
MDC IDC SET LEADCHNL RV PACING AMPLITUDE: 2.5 V
MDC IDC SET LEADCHNL RV SENSING SENSITIVITY: 4 mV
MDC IDC STAT BRADY AP VS PERCENT: 67 %
MDC IDC STAT BRADY AS VP PERCENT: 0 %

## 2018-02-25 ENCOUNTER — Encounter (HOSPITAL_COMMUNITY)
Admission: RE | Admit: 2018-02-25 | Discharge: 2018-02-25 | Disposition: A | Discharge status: Home / Self Care | Payer: Self-pay | Source: Ambulatory Visit | Attending: Cardiology | Admitting: Cardiology

## 2018-02-27 ENCOUNTER — Encounter (HOSPITAL_COMMUNITY): Payer: Self-pay

## 2018-02-27 DIAGNOSIS — I2510 Atherosclerotic heart disease of native coronary artery without angina pectoris: Secondary | ICD-10-CM | POA: Insufficient documentation

## 2018-03-01 ENCOUNTER — Encounter (HOSPITAL_COMMUNITY)
Admission: RE | Admit: 2018-03-01 | Discharge: 2018-03-01 | Disposition: A | Discharge status: Home / Self Care | Payer: Self-pay | Source: Ambulatory Visit | Attending: Cardiology | Admitting: Cardiology

## 2018-03-02 ENCOUNTER — Other Ambulatory Visit: Payer: Self-pay | Admitting: Cardiology

## 2018-03-04 ENCOUNTER — Encounter (HOSPITAL_COMMUNITY)
Admission: RE | Admit: 2018-03-04 | Discharge: 2018-03-04 | Disposition: A | Discharge status: Home / Self Care | Payer: Self-pay | Source: Ambulatory Visit | Attending: Cardiology | Admitting: Cardiology

## 2018-03-06 ENCOUNTER — Encounter (HOSPITAL_COMMUNITY)
Admission: RE | Admit: 2018-03-06 | Discharge: 2018-03-06 | Disposition: A | Discharge status: Home / Self Care | Payer: Self-pay | Source: Ambulatory Visit | Attending: Cardiology | Admitting: Cardiology

## 2018-03-08 ENCOUNTER — Ambulatory Visit: Payer: Medicare Other

## 2018-03-08 ENCOUNTER — Encounter (HOSPITAL_COMMUNITY)
Admission: RE | Admit: 2018-03-08 | Discharge: 2018-03-08 | Disposition: A | Discharge status: Home / Self Care | Payer: Self-pay | Source: Ambulatory Visit | Attending: Cardiology | Admitting: Cardiology

## 2018-03-08 DIAGNOSIS — Z5181 Encounter for therapeutic drug level monitoring: Secondary | ICD-10-CM | POA: Diagnosis not present

## 2018-03-08 DIAGNOSIS — I480 Paroxysmal atrial fibrillation: Secondary | ICD-10-CM

## 2018-03-08 LAB — POCT INR: INR: 2.4 (ref 2.0–3.0)

## 2018-03-08 NOTE — Patient Instructions (Signed)
Description   Continue taking on same dosage 1.5 tablets every day except 1 tablet on Tuesdays and  Thursdays.  Recheck INR in 6 weeks.       

## 2018-03-11 ENCOUNTER — Encounter (HOSPITAL_COMMUNITY)
Admission: RE | Admit: 2018-03-11 | Discharge: 2018-03-11 | Disposition: A | Discharge status: Home / Self Care | Payer: Medicare Other | Source: Ambulatory Visit | Attending: Cardiology | Admitting: Cardiology

## 2018-03-13 ENCOUNTER — Encounter (HOSPITAL_COMMUNITY)
Admission: RE | Admit: 2018-03-13 | Discharge: 2018-03-13 | Disposition: A | Discharge status: Home / Self Care | Payer: Medicare Other | Source: Ambulatory Visit | Attending: Cardiology | Admitting: Cardiology

## 2018-03-15 ENCOUNTER — Encounter (HOSPITAL_COMMUNITY)
Admission: RE | Admit: 2018-03-15 | Discharge: 2018-03-15 | Disposition: A | Discharge status: Home / Self Care | Payer: Medicare Other | Source: Ambulatory Visit | Attending: Cardiology | Admitting: Cardiology

## 2018-03-18 ENCOUNTER — Encounter (HOSPITAL_COMMUNITY)
Admission: RE | Admit: 2018-03-18 | Discharge: 2018-03-18 | Disposition: A | Discharge status: Home / Self Care | Payer: Self-pay | Source: Ambulatory Visit | Attending: Cardiology | Admitting: Cardiology

## 2018-03-20 ENCOUNTER — Encounter (HOSPITAL_COMMUNITY)
Admission: RE | Admit: 2018-03-20 | Discharge: 2018-03-20 | Disposition: A | Discharge status: Home / Self Care | Payer: Self-pay | Source: Ambulatory Visit | Attending: Cardiology | Admitting: Cardiology

## 2018-03-21 ENCOUNTER — Other Ambulatory Visit: Payer: Self-pay | Admitting: Cardiology

## 2018-03-22 ENCOUNTER — Encounter (HOSPITAL_COMMUNITY)
Admission: RE | Admit: 2018-03-22 | Discharge: 2018-03-22 | Disposition: A | Discharge status: Home / Self Care | Payer: Self-pay | Source: Ambulatory Visit | Attending: Cardiology | Admitting: Cardiology

## 2018-03-25 ENCOUNTER — Encounter (HOSPITAL_COMMUNITY)
Admission: RE | Admit: 2018-03-25 | Discharge: 2018-03-25 | Disposition: A | Discharge status: Home / Self Care | Payer: Self-pay | Source: Ambulatory Visit | Attending: Cardiology | Admitting: Cardiology

## 2018-03-27 ENCOUNTER — Encounter (HOSPITAL_COMMUNITY)
Admission: RE | Admit: 2018-03-27 | Discharge: 2018-03-27 | Disposition: A | Discharge status: Home / Self Care | Payer: Self-pay | Source: Ambulatory Visit | Attending: Cardiology | Admitting: Cardiology

## 2018-03-29 ENCOUNTER — Encounter (HOSPITAL_COMMUNITY)
Admission: RE | Admit: 2018-03-29 | Discharge: 2018-03-29 | Disposition: A | Discharge status: Home / Self Care | Payer: Self-pay | Source: Ambulatory Visit | Attending: Cardiology | Admitting: Cardiology

## 2018-03-29 DIAGNOSIS — I2510 Atherosclerotic heart disease of native coronary artery without angina pectoris: Secondary | ICD-10-CM | POA: Insufficient documentation

## 2018-04-01 ENCOUNTER — Encounter (HOSPITAL_COMMUNITY)
Admission: RE | Admit: 2018-04-01 | Discharge: 2018-04-01 | Disposition: A | Discharge status: Home / Self Care | Payer: Self-pay | Source: Ambulatory Visit | Attending: Cardiology | Admitting: Cardiology

## 2018-04-03 ENCOUNTER — Encounter (HOSPITAL_COMMUNITY)
Admission: RE | Admit: 2018-04-03 | Discharge: 2018-04-03 | Disposition: A | Discharge status: Home / Self Care | Payer: Self-pay | Source: Ambulatory Visit | Attending: Cardiology | Admitting: Cardiology

## 2018-04-05 ENCOUNTER — Encounter (HOSPITAL_COMMUNITY)
Admission: RE | Admit: 2018-04-05 | Discharge: 2018-04-05 | Disposition: A | Discharge status: Home / Self Care | Payer: Self-pay | Source: Ambulatory Visit | Attending: Cardiology | Admitting: Cardiology

## 2018-04-08 ENCOUNTER — Encounter (HOSPITAL_COMMUNITY)
Admission: RE | Admit: 2018-04-08 | Discharge: 2018-04-08 | Disposition: A | Discharge status: Home / Self Care | Payer: Self-pay | Source: Ambulatory Visit | Attending: Cardiology | Admitting: Cardiology

## 2018-04-10 ENCOUNTER — Encounter (HOSPITAL_COMMUNITY)
Admission: RE | Admit: 2018-04-10 | Discharge: 2018-04-10 | Disposition: A | Discharge status: Home / Self Care | Payer: Self-pay | Source: Ambulatory Visit | Attending: Cardiology | Admitting: Cardiology

## 2018-04-12 ENCOUNTER — Encounter (HOSPITAL_COMMUNITY)
Admission: RE | Admit: 2018-04-12 | Discharge: 2018-04-12 | Disposition: A | Discharge status: Home / Self Care | Payer: Self-pay | Source: Ambulatory Visit | Attending: Cardiology | Admitting: Cardiology

## 2018-04-15 ENCOUNTER — Encounter (HOSPITAL_COMMUNITY)
Admission: RE | Admit: 2018-04-15 | Discharge: 2018-04-15 | Disposition: A | Discharge status: Home / Self Care | Payer: Self-pay | Source: Ambulatory Visit | Attending: Cardiology | Admitting: Cardiology

## 2018-04-17 ENCOUNTER — Encounter (HOSPITAL_COMMUNITY)
Admission: RE | Admit: 2018-04-17 | Discharge: 2018-04-17 | Disposition: A | Discharge status: Home / Self Care | Payer: Self-pay | Source: Ambulatory Visit | Attending: Cardiology | Admitting: Cardiology

## 2018-04-19 ENCOUNTER — Encounter (HOSPITAL_COMMUNITY)
Admission: RE | Admit: 2018-04-19 | Discharge: 2018-04-19 | Disposition: A | Discharge status: Home / Self Care | Payer: Self-pay | Source: Ambulatory Visit | Attending: Cardiology | Admitting: Cardiology

## 2018-04-19 ENCOUNTER — Ambulatory Visit: Payer: Medicare Other | Admitting: *Deleted

## 2018-04-19 DIAGNOSIS — I480 Paroxysmal atrial fibrillation: Secondary | ICD-10-CM

## 2018-04-19 DIAGNOSIS — Z5181 Encounter for therapeutic drug level monitoring: Secondary | ICD-10-CM | POA: Diagnosis not present

## 2018-04-19 LAB — POCT INR: INR: 2.4 (ref 2.0–3.0)

## 2018-04-19 NOTE — Patient Instructions (Signed)
Description   Continue taking on same dosage 1.5 tablets every day except 1 tablet on Tuesdays and  Thursdays.  Recheck INR in 6 weeks.       

## 2018-04-22 ENCOUNTER — Encounter (HOSPITAL_COMMUNITY)
Admission: RE | Admit: 2018-04-22 | Discharge: 2018-04-22 | Disposition: A | Discharge status: Home / Self Care | Payer: Self-pay | Source: Ambulatory Visit | Attending: Cardiology | Admitting: Cardiology

## 2018-04-24 ENCOUNTER — Encounter (HOSPITAL_COMMUNITY)
Admission: RE | Admit: 2018-04-24 | Discharge: 2018-04-24 | Disposition: A | Discharge status: Home / Self Care | Payer: Self-pay | Source: Ambulatory Visit | Attending: Cardiology | Admitting: Cardiology

## 2018-04-29 ENCOUNTER — Encounter (HOSPITAL_COMMUNITY)
Admission: RE | Admit: 2018-04-29 | Discharge: 2018-04-29 | Disposition: A | Discharge status: Home / Self Care | Payer: Self-pay | Source: Ambulatory Visit | Attending: Cardiology | Admitting: Cardiology

## 2018-04-29 DIAGNOSIS — I2510 Atherosclerotic heart disease of native coronary artery without angina pectoris: Secondary | ICD-10-CM | POA: Insufficient documentation

## 2018-04-30 ENCOUNTER — Other Ambulatory Visit: Payer: Self-pay | Admitting: Cardiology

## 2018-05-01 ENCOUNTER — Encounter (HOSPITAL_COMMUNITY)
Admission: RE | Admit: 2018-05-01 | Discharge: 2018-05-01 | Disposition: A | Discharge status: Home / Self Care | Payer: Self-pay | Source: Ambulatory Visit | Attending: Cardiology | Admitting: Cardiology

## 2018-05-03 ENCOUNTER — Encounter (HOSPITAL_COMMUNITY): Payer: Self-pay

## 2018-05-06 ENCOUNTER — Encounter (HOSPITAL_COMMUNITY): Payer: Self-pay

## 2018-05-06 ENCOUNTER — Ambulatory Visit (INDEPENDENT_AMBULATORY_CARE_PROVIDER_SITE_OTHER): Payer: Medicare Other

## 2018-05-06 DIAGNOSIS — I442 Atrioventricular block, complete: Secondary | ICD-10-CM

## 2018-05-06 DIAGNOSIS — I495 Sick sinus syndrome: Secondary | ICD-10-CM

## 2018-05-07 NOTE — Progress Notes (Signed)
Remote pacemaker transmission.   

## 2018-05-08 ENCOUNTER — Encounter (HOSPITAL_COMMUNITY): Payer: Self-pay

## 2018-05-09 ENCOUNTER — Telehealth (HOSPITAL_COMMUNITY): Payer: Self-pay | Admitting: Family Medicine

## 2018-05-10 ENCOUNTER — Encounter (HOSPITAL_COMMUNITY): Payer: Self-pay

## 2018-05-13 ENCOUNTER — Encounter (HOSPITAL_COMMUNITY): Payer: Self-pay

## 2018-05-15 ENCOUNTER — Encounter (HOSPITAL_COMMUNITY): Payer: Self-pay

## 2018-05-17 ENCOUNTER — Encounter (HOSPITAL_COMMUNITY): Payer: Self-pay

## 2018-05-20 ENCOUNTER — Encounter (HOSPITAL_COMMUNITY): Payer: Self-pay

## 2018-05-24 ENCOUNTER — Encounter (HOSPITAL_COMMUNITY)
Admission: RE | Admit: 2018-05-24 | Discharge: 2018-05-24 | Disposition: A | Discharge status: Home / Self Care | Payer: Self-pay | Source: Ambulatory Visit | Attending: Cardiology | Admitting: Cardiology

## 2018-05-27 ENCOUNTER — Encounter (HOSPITAL_COMMUNITY)
Admission: RE | Admit: 2018-05-27 | Discharge: 2018-05-27 | Disposition: A | Discharge status: Home / Self Care | Payer: Self-pay | Source: Ambulatory Visit | Attending: Cardiology | Admitting: Cardiology

## 2018-05-31 ENCOUNTER — Encounter (INDEPENDENT_AMBULATORY_CARE_PROVIDER_SITE_OTHER): Payer: Self-pay

## 2018-05-31 ENCOUNTER — Encounter (HOSPITAL_COMMUNITY)
Admission: RE | Admit: 2018-05-31 | Discharge: 2018-05-31 | Disposition: A | Discharge status: Home / Self Care | Payer: Self-pay | Source: Ambulatory Visit | Attending: Cardiology | Admitting: Cardiology

## 2018-05-31 ENCOUNTER — Ambulatory Visit: Payer: Medicare Other

## 2018-05-31 DIAGNOSIS — I2510 Atherosclerotic heart disease of native coronary artery without angina pectoris: Secondary | ICD-10-CM | POA: Insufficient documentation

## 2018-05-31 DIAGNOSIS — I480 Paroxysmal atrial fibrillation: Secondary | ICD-10-CM | POA: Diagnosis not present

## 2018-05-31 DIAGNOSIS — Z5181 Encounter for therapeutic drug level monitoring: Secondary | ICD-10-CM | POA: Diagnosis not present

## 2018-05-31 LAB — POCT INR: INR: 2.3 (ref 2.0–3.0)

## 2018-05-31 NOTE — Patient Instructions (Signed)
Description   Continue taking on same dosage 1.5 tablets every day except 1 tablet on Tuesdays and  Thursdays.  Recheck INR in 6 weeks.

## 2018-06-02 ENCOUNTER — Other Ambulatory Visit: Payer: Self-pay | Admitting: Physician Assistant

## 2018-06-02 ENCOUNTER — Other Ambulatory Visit: Payer: Self-pay | Admitting: Cardiology

## 2018-06-03 ENCOUNTER — Encounter (HOSPITAL_COMMUNITY)
Admission: RE | Admit: 2018-06-03 | Discharge: 2018-06-03 | Disposition: A | Discharge status: Home / Self Care | Payer: Self-pay | Source: Ambulatory Visit | Attending: Cardiology | Admitting: Cardiology

## 2018-06-05 ENCOUNTER — Encounter (HOSPITAL_COMMUNITY)
Admission: RE | Admit: 2018-06-05 | Discharge: 2018-06-05 | Disposition: A | Discharge status: Home / Self Care | Payer: Self-pay | Source: Ambulatory Visit | Attending: Cardiology | Admitting: Cardiology

## 2018-06-06 ENCOUNTER — Ambulatory Visit (HOSPITAL_COMMUNITY)
Admission: RE | Admit: 2018-06-06 | Discharge: 2018-06-06 | Disposition: A | Discharge status: Home / Self Care | Payer: Medicare Other | Source: Ambulatory Visit | Attending: Cardiovascular Disease | Admitting: Cardiovascular Disease

## 2018-06-06 DIAGNOSIS — I6523 Occlusion and stenosis of bilateral carotid arteries: Secondary | ICD-10-CM

## 2018-06-07 ENCOUNTER — Encounter (HOSPITAL_COMMUNITY)
Admission: RE | Admit: 2018-06-07 | Discharge: 2018-06-07 | Disposition: A | Discharge status: Home / Self Care | Payer: Self-pay | Source: Ambulatory Visit | Attending: Cardiology | Admitting: Cardiology

## 2018-06-10 ENCOUNTER — Encounter (HOSPITAL_COMMUNITY)
Admission: RE | Admit: 2018-06-10 | Discharge: 2018-06-10 | Disposition: A | Discharge status: Home / Self Care | Payer: Medicare Other | Source: Ambulatory Visit | Attending: Cardiology | Admitting: Cardiology

## 2018-06-12 ENCOUNTER — Encounter (HOSPITAL_COMMUNITY)
Admission: RE | Admit: 2018-06-12 | Discharge: 2018-06-12 | Disposition: A | Discharge status: Home / Self Care | Payer: Self-pay | Source: Ambulatory Visit | Attending: Cardiology | Admitting: Cardiology

## 2018-06-14 ENCOUNTER — Encounter (HOSPITAL_COMMUNITY)
Admission: RE | Admit: 2018-06-14 | Discharge: 2018-06-14 | Disposition: A | Discharge status: Home / Self Care | Payer: Self-pay | Source: Ambulatory Visit | Attending: Cardiology | Admitting: Cardiology

## 2018-06-17 ENCOUNTER — Encounter (HOSPITAL_COMMUNITY)
Admission: RE | Admit: 2018-06-17 | Discharge: 2018-06-17 | Disposition: A | Discharge status: Home / Self Care | Payer: Self-pay | Source: Ambulatory Visit | Attending: Cardiology | Admitting: Cardiology

## 2018-06-18 ENCOUNTER — Encounter: Payer: Self-pay | Admitting: Internal Medicine

## 2018-06-18 ENCOUNTER — Ambulatory Visit (INDEPENDENT_AMBULATORY_CARE_PROVIDER_SITE_OTHER): Payer: Medicare Other | Admitting: Internal Medicine

## 2018-06-18 VITALS — BP 118/72 | HR 72 | Ht 67.0 in | Wt 242.4 lb

## 2018-06-18 DIAGNOSIS — I495 Sick sinus syndrome: Secondary | ICD-10-CM

## 2018-06-18 DIAGNOSIS — I442 Atrioventricular block, complete: Secondary | ICD-10-CM | POA: Diagnosis not present

## 2018-06-18 DIAGNOSIS — Z950 Presence of cardiac pacemaker: Secondary | ICD-10-CM

## 2018-06-18 DIAGNOSIS — I4891 Unspecified atrial fibrillation: Secondary | ICD-10-CM

## 2018-06-18 NOTE — Patient Instructions (Signed)
Medication Instructions:  Your physician recommends that you continue on your current medications as directed. Please refer to the Current Medication list given to you today.  Labwork: None ordered.  Testing/Procedures: None ordered.  Follow-Up: Your physician recommends that you schedule a follow-up appointment in:   One Year with Yevette Edwards, PA  Any Other Special Instructions Will Be Listed Below (If Applicable).     If you need a refill on your cardiac medications before your next appointment, please call your pharmacy.

## 2018-06-18 NOTE — Progress Notes (Signed)
Patient Care Team: Juluis RainierBarnes, Elizabeth, MD as PCP - General (Family Medicine) Quintella Reicherturner, Traci R, MD as PCP - Cardiology (Cardiology) Quintella Reicherturner, Traci R, MD as Consulting Physician (Cardiology) Duke SalviaKlein,  C, MD as Consulting Physician (Clinical Cardiac Electrophysiology)   HPI  Brandon Klein is a 78 y.o. male Seen in followup for pacemaker implanted in 2002 with generator replacement 2012. He was implanted initially for bradycardia attributed to vasovagal syncope.  He has a history of coronary disease and underwent stenting of his LAD 1997 and cutting balloon angioplasty of the circumflex in 2002. CABG 2015     DATE TEST EF   6/13 ECHO  55%   2/16 LHC  LIMA-LAD;SVG--D; SVG-OM  Patent SVG--PDA 100%  5/16 Echo   25-30%   9/16 Echo  35-40 %   9/17 Echo   40 %   10/18 Myoview  42% No ischemia  9/19 Echo  45% AS mod Grad mean 27   The patient denies chest pain, shortness of breath, nocturnal dyspnea, orthopnea or peripheral edema.  There have been no palpitations, lightheadedness or syncope.    He has a history of PAF. He is on Coumadin. No bleeding   Past Medical History:  Diagnosis Date  . AS (aortic stenosis)    moderate AS 2018  . Carotid artery plaque    a. Duplex 10/2013: mild calcific plaque origin ICA. Left: intimal wall thickening CCA. 0-39% BICA.  Marland Kitchen. Chronic renal insufficiency   . Chronic systolic dysfunction of left ventricle   . Coronary artery disease    a. s/p PCI of LAD 1997. b. Cutting balloon angioplasty to LCx in 2002. c. s/p cath 11/2013 with severe 3 vessel ASCAD s/p CABG with LIMA to LAD, SVG to diag, SVG to left circ and SVG to PDA. d. cath 07/07/2014 occluded SVG to PDA, all other grafts patent. 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 DJD (degenerative joint disease)   . Epistaxis   . GERD (gastroesophageal reflux disease)   . GI bleeding    a.  with benign gastric polypectomy 05/2014.  Marland Kitchen. HLD (hyperlipidemia)   . HTN (hypertension)   . Hypokalemia   .  Hypomagnesemia   . Impaired fasting glucose    A1C check every year  . Obesity   . OSA (obstructive sleep apnea)   . Pacemaker    a. 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 b. MDT generator replacement 2012.  Marland Kitchen. PAF (paroxysmal atrial fibrillation) (HCC)    on chronic systemic anticoagulation  . PVC's (premature ventricular contractions)   . Syncope    a. vasovagal with documented bradycardia.  . Type 2 diabetes mellitus without complications (HCC) 12/02/2015    Past Surgical History:  Procedure Laterality Date  . ANGIOPLASTY  1997  . CHOLECYSTECTOMY    . COLONOSCOPY WITH PROPOFOL N/A 05/05/2014   Procedure: COLONOSCOPY WITH PROPOFOL;  Surgeon: Charolett BumpersMartin K Johnson, MD;  Location: [facility] ENDOSCOPY;  Service: Endoscopy;  Laterality: N/A;  . CORONARY ARTERY BYPASS GRAFT N/A 12/01/2013   Procedure: CORONARY ARTERY BYPASS GRAFT TIMES FOUR USING LEFT INTERNAL MAMMARY ARTERY TO LAD, SAPHENOUS VEIN GRAFTS TO DIAGONAL, CIRCUMFELX AND POSTERIOR DESCENDING;  Surgeon: Kerin PernaPeter Van Trigt, MD;  Location: [facility] OR;  Service: Open Heart Surgery;  Laterality: N/A;  . ESOPHAGOGASTRODUODENOSCOPY (EGD) WITH PROPOFOL N/A 05/05/2014   Procedure: ESOPHAGOGASTRODUODENOSCOPY (EGD) WITH PROPOFOL;  Surgeon: Charolett BumpersMartin K Johnson, MD;  Location: [facility] ENDOSCOPY;  Service: Endoscopy;  Laterality: N/A;  . KNEE ARTHROSCOPY  2000   Left  . LEFT AND RIGHT HEART CATHETERIZATION WITH CORONARY/GRAFT ANGIOGRAM N/A 07/07/2014   Procedure: LEFT AND RIGHT HEART CATHETERIZATION WITH Isabel Caprice;  Surgeon: Kathleene Hazel, MD;  Location: [facility] CATH LAB;  Service: Cardiovascular;  Laterality: N/A;  . LEFT HEART CATHETERIZATION WITH CORONARY ANGIOGRAM N/A 11/25/2013   Procedure: LEFT HEART CATHETERIZATION WITH CORONARY ANGIOGRAM;  Surgeon: [name], MD;  Location: [facility] CATH LAB;  Service: Cardiovascular;  Laterality: N/A;  . PACEMAKER INSERTION  2002   generator change MDT by Dr Graciela Husbands in 2012  . TEE WITHOUT  CARDIOVERSION N/A 08/31/2014   Procedure: TRANSESOPHAGEAL ECHOCARDIOGRAM (TEE);  Surgeon: [name], MD;  Location: [facility] ENDOSCOPY;  Service: Cardiovascular;  Laterality: N/A;    Current Outpatient Medications  Medication Sig Dispense Refill  . aspirin EC 81 MG EC tablet Take 1 tablet (81 mg total) by mouth daily.    Marland Kitchen atorvastatin (LIPITOR) 80 MG tablet Take 1 tablet (80 mg total) by mouth daily. 90 tablet 2  . carvedilol (COREG) 25 MG tablet TAKE 1 TABLET(25 MG) BY MOUTH TWICE DAILY WITH A MEAL 180 tablet 1  . Cholecalciferol (VITAMIN D) 2000 UNITS tablet Take 2,000 Units by mouth daily.    . Cyanocobalamin (VITAMIN B-12 PO) Take 1 tablet by mouth every morning.    . docusate sodium (COLACE) 100 MG capsule Take 100 mg by mouth 2 (two) times daily as needed for mild constipation.    . fexofenadine (ALLEGRA) 180 MG tablet Take 180 mg by mouth every morning.     . fluticasone (FLONASE) 50 MCG/ACT nasal spray Place 2 sprays into the nose every morning.     . furosemide (LASIX) 20 MG tablet TAKE 1 TABLET BY MOUTH TWICE DAILY WITH 80 MG TO TOTAL 100 MG 180 tablet 2  . furosemide (LASIX) 80 MG tablet TAKE 1 TABLET BY MOUTH TWICE DAILY. TAKE IT WITH 20MG  TABLETS 180 tablet 2  . Glucosamine-Chondroit-Vit C-Mn (GLUCOSAMINE CHONDR 1500 COMPLX) CAPS Take 1 capsule by mouth every morning.     . hydrALAZINE (APRESOLINE) 25 MG tablet TAKE 1 TABLET(25 MG) BY MOUTH THREE TIMES DAILY 270 tablet 2  . isosorbide mononitrate (IMDUR) 30 MG 24 hr tablet TAKE 1 TABLET BY MOUTH EVERY DAY 90 tablet 1  . L-Lysine 500 MG CAPS Take 1 capsule by mouth every morning.     . meclizine (ANTIVERT) 25 MG tablet Take 25 mg by mouth daily as needed for dizziness.     . Multiple Vitamin (MULTIVITAMIN) tablet Take 1 tablet by mouth every morning.     . nitroGLYCERIN (NITROSTAT) 0.4 MG SL tablet Place 1 tablet (0.4 mg total) under the tongue every 5 (five) minutes as needed for chest pain. 30 tablet 3  . Omega-3 Fatty Acids (FISH  OIL) 1000 MG CPDR Take 2 capsules by mouth 2 (two) times daily.    . ONE TOUCH ULTRA TEST test strip 1 each by Other route every morning.     Letta Pate DELICA LANCETS FINE MISC 1 each by Other route every morning.     . pantoprazole (PROTONIX) 40 MG tablet Take 40 mg by mouth every morning.     . potassium chloride SA (K-DUR,KLOR-CON) 20 MEQ tablet TAKE 2 TABLETS BY MOUTH TWICE DAILY 360 tablet 2  . RA NATURAL MAGNESIUM 250 MG TABS TAKE 2 TABLETS TWICE DAILY. 120 tablet 5  . spironolactone (ALDACTONE) 25 MG tablet TAKE 1 TABLET(25 MG) BY MOUTH DAILY 90 tablet 1  .  warfarin (COUMADIN) 5 MG tablet USE AS DIRECTED BY COUMADIN CLINIC 135 tablet 0   No current facility-administered medications for this visit.     Allergies  Allergen Reactions  . Acetaminophen-Codeine Other (See Comments)    Other reaction(s): Other (See Comments) Extreme constipation Extreme constipation   . Amoxicillin Itching and Rash    Unknown-maybe a rash?  . Diovan [Valsartan] Other (See Comments)    Unknown     Review of Systems negative except from HPI and PMH  Physical Exam BP 118/72   Pulse 72   Ht 5\' 7"  (1.702 m)   Wt 242 lb 6.4 oz (110 kg)   SpO2 95%   BMI 37.97 kg/m  Well developed and nourished in no acute distress HENT normal Neck supple with JVP-flat Clear Regular rate and rhythm, no murmurs or gallops Abd-soft with active BS No Clubbing cyanosis edema Skin-warm and dry A & Oriented  Grossly normal sensory and motor function  ECG Apacing 72 24/14/39 Axis -47  Assessment and  Plan  Atrial fibrillation paroxysmal  Coronary artery disease with prior PCI and bypass surgery 2015  Pacemaker -[employer]tr[employer]   The patient's device was interrogated.  The information was reviewed. No changes were made in the programming.     Congestive heart failure-chronic-class II  Cardiomyopathy question mechanism with concomitant coronary disease  Aortic stenosis-moderate  Sinus node  dysfunction  Without symptoms of ischemia  Euvolemic continue current meds  On Anticoagulation;  No bleeding issues   Encouraged weight loss

## 2018-06-19 ENCOUNTER — Encounter (HOSPITAL_COMMUNITY)
Admission: RE | Admit: 2018-06-19 | Discharge: 2018-06-19 | Disposition: A | Discharge status: Home / Self Care | Payer: Self-pay | Source: Ambulatory Visit | Attending: Cardiology | Admitting: Cardiology

## 2018-06-19 LAB — CUP PACEART REMOTE DEVICE CHECK
Battery Impedance: 551 Ohm
Battery Remaining Longevity: 89 mo
Battery Voltage: 2.77 V
Brady Statistic AP VP Percent: 0 %
Brady Statistic AP VS Percent: 68 %
Brady Statistic AS VP Percent: 0 %
Brady Statistic AS VS Percent: 32 %
Date Time Interrogation Session: 20191209194000
Implantable Lead Implant Date: 20020926
Implantable Lead Implant Date: 20020926
Implantable Lead Location: 753860
Implantable Lead Model: 5076
Implantable Pulse Generator Implant Date: 20120905
Lead Channel Impedance Value: 1035 Ohm
Lead Channel Pacing Threshold Amplitude: 0.625 V
Lead Channel Pacing Threshold Amplitude: 1 V
Lead Channel Pacing Threshold Pulse Width: 0.4 ms
Lead Channel Pacing Threshold Pulse Width: 0.4 ms
Lead Channel Setting Pacing Amplitude: 2 V
Lead Channel Setting Pacing Pulse Width: 0.4 ms
Lead Channel Setting Sensing Sensitivity: 4 mV
MDC IDC LEAD LOCATION: 753859
MDC IDC MSMT LEADCHNL RA IMPEDANCE VALUE: 519 Ohm
MDC IDC SET LEADCHNL RV PACING AMPLITUDE: 2.5 V

## 2018-06-20 LAB — CUP PACEART INCLINIC DEVICE CHECK
Battery Impedance: 601 Ohm
Battery Remaining Longevity: 86 mo
Brady Statistic AP VP Percent: 0 %
Brady Statistic AP VS Percent: 68 %
Brady Statistic AS VS Percent: 32 %
Date Time Interrogation Session: 20200121201657
Implantable Lead Implant Date: 20020926
Implantable Lead Implant Date: 20020926
Implantable Lead Location: 753860
Implantable Lead Model: 5076
Implantable Lead Model: 5092
Implantable Pulse Generator Implant Date: 20120905
Lead Channel Impedance Value: 527 Ohm
Lead Channel Impedance Value: 866 Ohm
Lead Channel Pacing Threshold Amplitude: 0.5 V
Lead Channel Pacing Threshold Amplitude: 0.5 V
Lead Channel Pacing Threshold Amplitude: 1 V
Lead Channel Pacing Threshold Amplitude: 1 V
Lead Channel Pacing Threshold Pulse Width: 0.21 ms
Lead Channel Pacing Threshold Pulse Width: 0.4 ms
Lead Channel Pacing Threshold Pulse Width: 0.4 ms
Lead Channel Sensing Intrinsic Amplitude: 11.2 mV
Lead Channel Sensing Intrinsic Amplitude: 2 mV
Lead Channel Setting Pacing Amplitude: 2 V
Lead Channel Setting Pacing Amplitude: 2.5 V
Lead Channel Setting Pacing Pulse Width: 0.4 ms
Lead Channel Setting Sensing Sensitivity: 4 mV
MDC IDC LEAD LOCATION: 753859
MDC IDC MSMT BATTERY VOLTAGE: 2.78 V
MDC IDC MSMT LEADCHNL RV PACING THRESHOLD PULSEWIDTH: 0.4 ms
MDC IDC STAT BRADY AS VP PERCENT: 0 %

## 2018-06-21 ENCOUNTER — Encounter (HOSPITAL_COMMUNITY)
Admission: RE | Admit: 2018-06-21 | Discharge: 2018-06-21 | Disposition: A | Discharge status: Home / Self Care | Payer: Self-pay | Source: Ambulatory Visit | Attending: Cardiology | Admitting: Cardiology

## 2018-06-24 ENCOUNTER — Encounter (HOSPITAL_COMMUNITY)
Admission: RE | Admit: 2018-06-24 | Discharge: 2018-06-24 | Disposition: A | Discharge status: Home / Self Care | Payer: Self-pay | Source: Ambulatory Visit | Attending: Cardiology | Admitting: Cardiology

## 2018-06-26 ENCOUNTER — Encounter (HOSPITAL_COMMUNITY)
Admission: RE | Admit: 2018-06-26 | Discharge: 2018-06-26 | Disposition: A | Discharge status: Home / Self Care | Payer: Self-pay | Source: Ambulatory Visit | Attending: Cardiology | Admitting: Cardiology

## 2018-06-28 ENCOUNTER — Encounter (HOSPITAL_COMMUNITY)
Admission: RE | Admit: 2018-06-28 | Discharge: 2018-06-28 | Disposition: A | Discharge status: Home / Self Care | Payer: Self-pay | Source: Ambulatory Visit | Attending: Cardiology | Admitting: Cardiology

## 2018-07-01 ENCOUNTER — Encounter (HOSPITAL_COMMUNITY): Payer: Self-pay

## 2018-07-01 DIAGNOSIS — I2510 Atherosclerotic heart disease of native coronary artery without angina pectoris: Secondary | ICD-10-CM | POA: Insufficient documentation

## 2018-07-02 ENCOUNTER — Other Ambulatory Visit: Payer: Self-pay | Admitting: Cardiology

## 2018-07-03 ENCOUNTER — Encounter (HOSPITAL_COMMUNITY)
Admission: RE | Admit: 2018-07-03 | Discharge: 2018-07-03 | Disposition: A | Discharge status: Home / Self Care | Payer: Self-pay | Source: Ambulatory Visit | Attending: Cardiology | Admitting: Cardiology

## 2018-07-04 ENCOUNTER — Telehealth: Payer: Self-pay | Admitting: Cardiology

## 2018-07-04 NOTE — Telephone Encounter (Signed)
   Primary Cardiologist: Armanda Magic, MD  Chart reviewed as part of pre-operative protocol coverage. Simple dental extractions are considered low risk procedures per guidelines and generally do not require any specific cardiac clearance. It is also generally accepted that for simple extractions and dental cleanings, there is no need to interrupt blood thinner therapy.   SBE prophylaxis is not required for the patient.  I will route this recommendation to the requesting party via Epic fax function and remove from pre-op pool.  Please call with questions.  Roe Rutherford Duke, PA 07/04/2018, 4:24 PM

## 2018-07-04 NOTE — Telephone Encounter (Signed)
New Message      [facility] Pre-operative Risk Assessment    Request for surgical clearance:  1. What type of surgery is being performed? Dental Extraction  2. When is this surgery scheduled? TBD   3. What type of clearance is required (medical clearance vs. Pharmacy clearance to hold med vs. Both)? Medical Clearance   4. Are there any medications that need to be held prior to surgery and how long?   5. Practice name and name of physician performing surgery?  [facility] Dr. Joylene Draft Tajeddini  6. What is your office phone number [phone]    7.   What is your office fax number [phone]  8.   Anesthesia type (None, local, MAC, general) ? Lidocaine    Requesting a written INR reading of 3.5 prior to the extraction  [place] 07/04/2018, 2:56 PM  _________________________________________________________________   (provider comments below)

## 2018-07-05 ENCOUNTER — Encounter (HOSPITAL_COMMUNITY)
Admission: RE | Admit: 2018-07-05 | Discharge: 2018-07-05 | Disposition: A | Discharge status: Home / Self Care | Payer: Self-pay | Source: Ambulatory Visit | Attending: Cardiology | Admitting: Cardiology

## 2018-07-08 ENCOUNTER — Encounter (HOSPITAL_COMMUNITY)
Admission: RE | Admit: 2018-07-08 | Discharge: 2018-07-08 | Disposition: A | Discharge status: Home / Self Care | Payer: Medicare Other | Source: Ambulatory Visit | Attending: Cardiology | Admitting: Cardiology

## 2018-07-10 ENCOUNTER — Encounter (HOSPITAL_COMMUNITY)
Admission: RE | Admit: 2018-07-10 | Discharge: 2018-07-10 | Disposition: A | Discharge status: Home / Self Care | Payer: Self-pay | Source: Ambulatory Visit | Attending: Cardiology | Admitting: Cardiology

## 2018-07-12 ENCOUNTER — Encounter (HOSPITAL_COMMUNITY)
Admission: RE | Admit: 2018-07-12 | Discharge: 2018-07-12 | Disposition: A | Discharge status: Home / Self Care | Payer: Self-pay | Source: Ambulatory Visit | Attending: Cardiology | Admitting: Cardiology

## 2018-07-12 ENCOUNTER — Ambulatory Visit (INDEPENDENT_AMBULATORY_CARE_PROVIDER_SITE_OTHER): Payer: Medicare Other | Admitting: Pharmacist

## 2018-07-12 DIAGNOSIS — Z5181 Encounter for therapeutic drug level monitoring: Secondary | ICD-10-CM

## 2018-07-12 DIAGNOSIS — I480 Paroxysmal atrial fibrillation: Secondary | ICD-10-CM | POA: Diagnosis not present

## 2018-07-12 LAB — POCT INR: INR: 2.9 (ref 2.0–3.0)

## 2018-07-12 NOTE — Patient Instructions (Signed)
Description   Continue taking on same dosage 1.5 tablets every day except 1 tablet on Tuesdays and  Thursdays.  Recheck INR in 6 weeks.  Coumadin clinic [phone].

## 2018-07-15 ENCOUNTER — Encounter (HOSPITAL_COMMUNITY)
Admission: RE | Admit: 2018-07-15 | Discharge: 2018-07-15 | Disposition: A | Discharge status: Home / Self Care | Payer: Self-pay | Source: Ambulatory Visit | Attending: Cardiology | Admitting: Cardiology

## 2018-07-17 ENCOUNTER — Encounter (HOSPITAL_COMMUNITY)
Admission: RE | Admit: 2018-07-17 | Discharge: 2018-07-17 | Disposition: A | Discharge status: Home / Self Care | Payer: Self-pay | Source: Ambulatory Visit | Attending: Cardiology | Admitting: Cardiology

## 2018-07-19 ENCOUNTER — Encounter (HOSPITAL_COMMUNITY)
Admission: RE | Admit: 2018-07-19 | Discharge: 2018-07-19 | Disposition: A | Discharge status: Home / Self Care | Payer: Self-pay | Source: Ambulatory Visit | Attending: Cardiology | Admitting: Cardiology

## 2018-07-22 ENCOUNTER — Encounter (HOSPITAL_COMMUNITY)
Admission: RE | Admit: 2018-07-22 | Discharge: 2018-07-22 | Disposition: A | Discharge status: Home / Self Care | Payer: Self-pay | Source: Ambulatory Visit | Attending: Cardiology | Admitting: Cardiology

## 2018-07-24 ENCOUNTER — Encounter (HOSPITAL_COMMUNITY)
Admission: RE | Admit: 2018-07-24 | Discharge: 2018-07-24 | Disposition: A | Discharge status: Home / Self Care | Payer: Self-pay | Source: Ambulatory Visit | Attending: Cardiology | Admitting: Cardiology

## 2018-07-26 ENCOUNTER — Encounter (HOSPITAL_COMMUNITY)
Admission: RE | Admit: 2018-07-26 | Discharge: 2018-07-26 | Disposition: A | Discharge status: Home / Self Care | Payer: Self-pay | Source: Ambulatory Visit | Attending: Cardiology | Admitting: Cardiology

## 2018-07-29 ENCOUNTER — Encounter (HOSPITAL_COMMUNITY)
Admission: RE | Admit: 2018-07-29 | Discharge: 2018-07-29 | Disposition: A | Discharge status: Home / Self Care | Payer: Self-pay | Source: Ambulatory Visit | Attending: Cardiology | Admitting: Cardiology

## 2018-07-29 DIAGNOSIS — I2510 Atherosclerotic heart disease of native coronary artery without angina pectoris: Secondary | ICD-10-CM | POA: Insufficient documentation

## 2018-07-31 ENCOUNTER — Encounter (HOSPITAL_COMMUNITY)
Admission: RE | Admit: 2018-07-31 | Discharge: 2018-07-31 | Disposition: A | Discharge status: Home / Self Care | Payer: Self-pay | Source: Ambulatory Visit | Attending: Cardiology | Admitting: Cardiology

## 2018-07-31 NOTE — Progress Notes (Signed)
Cardiology Office Note:    Date:  08/02/2018   ID:  Brandon Klein, DOB 07/18/1940, MRN [id number]  PCP:  Leighton Ruff, MD  Cardiologist:  Fransico Him, MD    Referring MD: Leighton Ruff, MD   Chief Complaint  Patient presents with  . Coronary Artery Disease  . Hyperlipidemia  . Atrial Fibrillation  . Congestive Heart Failure  . Hypertension  . Sleep Apnea    History of Present Illness:    Brandon Klein is a 78 y.o. male with a hx of CAD, status post prior stenting to the LAD and balloon angioplasty to the circumflex, cath 10/2013 with severe 3 vessel ASCAD s/p CABG (LIMA-LAD, SVG to diag,SVG to circ, SVG to PDA) with normal LVF at time of cath, paroxysmal atrial fibrillation, bradycardia s/p PPM, moderate to severe aortic stenosis by recent dobutamine echo, HTN, HL.   He developed acute CHF about 6 months after CABG and was found to have severe LV dysfunction. Cath 07/07/2014 showed occluded SVG to PDA, all other grafts patent. EF down felt secondary to NICM but there has been some concern regarding possible worsening AS. There was some concern that his AS was underestimated due to LV dysfunction and he underwent dobutamine echo. The aortic valve peak velocity increased from 2.35msec to 3.29 m/sec at peak dobutamine infusion. The mean AV gradient increased from 249mg to 37.20m73m at peak dobutamine infusion. The AVA was calculated anywhere from 1.4cm2 at baselineto 1.15cm2 at mid dose and 1.4cm2 at high dose. All data consistent with at least moderate aortic stenosis. He underwent TEE to get a planimetered valve area which was1.14-1.4cm2 c/w moderate AS. EF at that time was 20-25%. He was referred to Dr. AllRayann Hemanr consideration of prophylatic upgrade of pacer to AICD implantation. 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 He did not meet criteria for CRT due to  narrow QRS on EKG. He decided not to pursue ICD at that time and wanted to wait until his appt with Dr. KleCaryl Comes August. Repeat echo at that time showed EF 35-40% and ICD was not indicated. He was noted to have NSVT on pacer check and underwent stress test showing no ischemia.  Repeat echo 02/2017 showed EF 45-50%.    Was seen 11/2017 by MicEstella HuskA and was continuing in cardiac Rehab and golfing for exercise.  He had no angina at that time.  2D echo 01/2018 showed mildly reduced LVF with EF 45% with moderate AS.   He is here today for followup and is doing well.  he denies any chest pain or pressure, SOB, DOE, PND, orthopnea, LE edema, dizziness, palpitations or syncope. He is compliant with his meds and is tolerating meds with no SE.  He is concerned about his lipids.  His triglycerides have been elevated and he was ordered to start Vascepa but the cost was prohibitive.  Instead he went with OTC fish oil.  He would like his lipids repeated and he is due for a repeat FLP.  Past Medical History:  Diagnosis Date  . AS (aortic stenosis)    moderate AS 2018  . Carotid artery plaque    a. Duplex 10/2013: mild calcific plaque origin ICA. Left: intimal wall thickening CCA. 0-39% BICA.  . CMarland Kitchenronic renal insufficiency   . Chronic systolic dysfunction of left ventricle   . Coronary artery disease    a.  s/p PCI of LAD 1997. b. Cutting balloon angioplasty to LCx in 2002. c. s/p cath 11/2013 with severe 3 vessel ASCAD s/p CABG with LIMA to LAD, SVG to diag, SVG to left circ and SVG to PDA. d. cath 07/07/2014 occluded SVG to PDA, all other grafts patent. 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 DJD (degenerative joint disease)   . Epistaxis   . GERD (gastroesophageal reflux disease)   . GI bleeding    a.  with benign gastric polypectomy 05/2014.  Marland Kitchen HLD (hyperlipidemia)   . HTN (hypertension)   . Hypokalemia   . Hypomagnesemia   . Impaired fasting glucose    A1C check every year  . Obesity   . OSA (obstructive  sleep apnea)   . Pacemaker    a. 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 b. MDT generator replacement 2012.  Marland Kitchen PAF (paroxysmal atrial fibrillation) (HCC)    on chronic systemic anticoagulation  . PVC's (premature ventricular contractions)   . Syncope    a. vasovagal with documented bradycardia.  . Type 2 diabetes mellitus without complications ([place]) 1/0/2585    Past Surgical History:  Procedure Laterality Date  . ANGIOPLASTY  1997  . CHOLECYSTECTOMY    . COLONOSCOPY WITH PROPOFOL N/A 05/05/2014   Procedure: COLONOSCOPY WITH PROPOFOL;  Surgeon: Garlan Fair, MD;  Location: [facility];  Service: Endoscopy;  Laterality: N/A;  . CORONARY ARTERY BYPASS GRAFT N/A 12/01/2013   Procedure: CORONARY ARTERY BYPASS GRAFT TIMES FOUR USING LEFT INTERNAL MAMMARY ARTERY TO LAD, SAPHENOUS VEIN GRAFTS TO DIAGONAL, CIRCUMFELX AND POSTERIOR DESCENDING;  Surgeon: Ivin Poot, MD;  Location: [facility];  Service: Open Heart Surgery;  Laterality: N/A;  . ESOPHAGOGASTRODUODENOSCOPY (EGD) WITH PROPOFOL N/A 05/05/2014   Procedure: ESOPHAGOGASTRODUODENOSCOPY (EGD) WITH PROPOFOL;  Surgeon: Garlan Fair, MD;  Location: [facility];  Service: Endoscopy;  Laterality: N/A;  . KNEE ARTHROSCOPY  2000   Left  . LEFT AND RIGHT HEART CATHETERIZATION WITH CORONARY/GRAFT ANGIOGRAM N/A 07/07/2014   Procedure: LEFT AND RIGHT HEART CATHETERIZATION WITH Beatrix Fetters;  Surgeon: Burnell Blanks, MD;  Location: [facility] CATH LAB;  Service: Cardiovascular;  Laterality: N/A;  . LEFT HEART CATHETERIZATION WITH CORONARY ANGIOGRAM N/A 11/25/2013   Procedure: LEFT HEART CATHETERIZATION WITH CORONARY ANGIOGRAM;  Surgeon: Sueanne Margarita, MD;  Location: [facility] CATH LAB;  Service: Cardiovascular;  Laterality: N/A;  . PACEMAKER INSERTION  2002   generator change MDT by Dr Caryl Comes in 2012  . TEE WITHOUT CARDIOVERSION N/A 08/31/2014   Procedure: TRANSESOPHAGEAL ECHOCARDIOGRAM (TEE);  Surgeon: Sueanne Margarita, MD;   Location: [facility];  Service: Cardiovascular;  Laterality: N/A;    Current Medications: Current Meds  Medication Sig  . aspirin EC 81 MG EC tablet Take 1 tablet (81 mg total) by mouth daily.  Marland Kitchen atorvastatin (LIPITOR) 80 MG tablet Take 1 tablet (80 mg total) by mouth daily.  . carvedilol (COREG) 25 MG tablet TAKE 1 TABLET(25 MG) BY MOUTH TWICE DAILY WITH A MEAL  . docusate sodium (COLACE) 100 MG capsule Take 100 mg by mouth 2 (two) times daily as needed for mild constipation.  . fexofenadine (ALLEGRA) 180 MG tablet Take 180 mg by mouth every morning.   . fluticasone (FLONASE) 50 MCG/ACT nasal spray Place 2 sprays into the nose every morning.   . furosemide (LASIX) 20 MG tablet TAKE 1 TABLET BY MOUTH TWICE DAILY. WITH 80 MG TO TOTAL 100MG  . furosemide (LASIX) 80 MG tablet TAKE 1 TABLET BY  MOUTH TWICE DAILY. TAKE WITH 20 MG TABLET  . Glucosamine-Chondroit-Vit C-Mn (GLUCOSAMINE CHONDR 1500 COMPLX) CAPS Take 1 capsule by mouth every morning.   . hydrALAZINE (APRESOLINE) 25 MG tablet TAKE 1 TABLET(25 MG) BY MOUTH THREE TIMES DAILY  . isosorbide mononitrate (IMDUR) 30 MG 24 hr tablet TAKE 1 TABLET BY MOUTH EVERY DAY  . L-Lysine 500 MG CAPS Take 1 capsule by mouth every morning.   . meclizine (ANTIVERT) 25 MG tablet Take 25 mg by mouth daily as needed for dizziness.   . Multiple Vitamin (MULTIVITAMIN) tablet Take 1 tablet by mouth every morning.   . nitroGLYCERIN (NITROSTAT) 0.4 MG SL tablet Place 1 tablet (0.4 mg total) under the tongue every 5 (five) minutes as needed for chest pain.  . Omega-3 Fatty Acids (FISH OIL) 1000 MG CPDR Take 2 capsules by mouth 2 (two) times daily.  . ONE TOUCH ULTRA TEST test strip 1 each by Other route every morning.   Glory Rosebush DELICA LANCETS FINE MISC 1 each by Other route every morning.   . pantoprazole (PROTONIX) 40 MG tablet Take 40 mg by mouth every morning.   . potassium chloride SA (K-DUR,KLOR-CON) 20 MEQ tablet TAKE 2 TABLETS BY MOUTH TWICE DAILY  . RA  NATURAL MAGNESIUM 250 MG TABS TAKE 2 TABLETS TWICE DAILY.  Marland Kitchen spironolactone (ALDACTONE) 25 MG tablet TAKE 1 TABLET(25 MG) BY MOUTH DAILY  . warfarin (COUMADIN) 5 MG tablet USE AS DIRECTED BY COUMADIN CLINIC     Allergies:   Acetaminophen-codeine; Amoxicillin; and Diovan [valsartan]   Social History   Socioeconomic History  . Marital status: Married    Spouse name: Not on file  . Number of children: Not on file  . Years of education: Not on file  . Highest education level: Not on file  Occupational History  . Not on file  Social Needs  . Financial resource strain: Not on file  . Food insecurity:    Worry: Not on file    Inability: Not on file  . Transportation needs:    Medical: Not on file    Non-medical: Not on file  Tobacco Use  . Smoking status: Former Smoker    Packs/day: 2.00    Years: 5.00    Pack years: 10.00    Types: Cigarettes    Last attempt to quit: 05/29/1966    Years since quitting: 52.2  . Smokeless tobacco: Never Used  Substance and Sexual Activity  . Alcohol use: Yes    Alcohol/week: 0.0 standard drinks    Comment: ocassional  . Drug use: No  . Sexual activity: Not on file  Lifestyle  . Physical activity:    Days per week: Not on file    Minutes per session: Not on file  . Stress: Not on file  Relationships  . Social connections:    Talks on phone: Not on file    Gets together: Not on file    Attends religious service: Not on file    Active member of club or organization: Not on file    Attends meetings of clubs or organizations: Not on file    Relationship status: Not on file  Other Topics Concern  . Not on file  Social History Narrative   Lives in [place] with spouse.  Retired Hotel manager     Family History: The patient's family history includes Colon cancer in his mother; Heart attack in his brother and mother; Leukemia in his father.  ROS:   Please see the  history of present illness.    ROS  All other systems reviewed and negative.     EKGs/Labs/Other Studies Reviewed:    The following studies were reviewed today: none  EKG:  EKG is not ordered today.    Recent Labs: 08/01/2018: ALT 24   Recent Lipid Panel    Component Value Date/Time   CHOL 116 08/01/2018 1111   CHOL 113 07/22/2014 1226   TRIG 128 08/01/2018 1111   TRIG 91 07/22/2014 1226   HDL 44 08/01/2018 1111   HDL 40 07/22/2014 1226   CHOLHDL 2.6 08/01/2018 1111   CHOLHDL 3.4 06/04/2015 1216   VLDL 39 (H) 06/04/2015 1216   LDLCALC 46 08/01/2018 1111   LDLCALC 55 07/22/2014 1226   LDLDIRECT 47.9 07/14/2013 0937    Physical Exam:    VS:  BP 116/66   Pulse 66   Ht 5' 7" (1.702 m)   Wt 245 lb (111.1 kg)   SpO2 95%   BMI 38.37 kg/m     Wt Readings from Last 3 Encounters:  08/01/18 245 lb (111.1 kg)  06/18/18 242 lb 6.4 oz (110 kg)  12/25/17 243 lb (110.2 kg)     GEN:  Well nourished, well developed in no acute distress HEENT: Normal NECK: No JVD; No carotid bruits LYMPHATICS: No lymphadenopathy CARDIAC: RRR, no murmurs, rubs, gallops RESPIRATORY:  Clear to auscultation without rales, wheezing or rhonchi  ABDOMEN: Soft, non-tender, non-distended MUSCULOSKELETAL:  No edema; No deformity  SKIN: Warm and dry NEUROLOGIC:  Alert and oriented x 3 PSYCHIATRIC:  Normal affect   ASSESSMENT:    1. PAF (paroxysmal atrial fibrillation) ([place])   2. Nonrheumatic aortic valve stenosis   3. Atherosclerosis of both carotid arteries   4. Chronic combined systolic and diastolic CHF (congestive heart failure) ([place])   5. Essential hypertension   6. Coronary artery disease involving native coronary artery of native heart without angina pectoris   7. OSA (obstructive sleep apnea)   8. Dyslipidemia    PLAN:    In order of problems listed above:  1.  Paroxysmal atrial fibrillation - he is maintaining NSR on exam today.  He will continue on carvedilol 16m BID and warfarin.   2.  Aortic stenosis - this is moderate by echo 01/2018.  He is asymptomatic.   I will repeat an echo 01/2019 to make sure his valve is stable.   3.  Carotid artery stenosis - dopplers 05/2018 showed 1-39% bilateral stenosis.  Continue on statin.  4.  Chronic combined systolic/diastolic CHF - he appears euvolemic on exam today.  He has not had any SOB or DOE.   His weight is very stable and has been within 2-3 lbs in the past 8 months. He will continue on  Carvedilol 248mBID, Lasix 10045mID, Hydralazine 71m32mD, Imdur 30mg19mly and spironolactone 71mg 89my.  I will get a copy of last BMET from PCP.  5.  ASCAD - s/p remote CABG. Cath 07/07/2014 showed occluded SVG to PDA, all other grafts patent. He has not had any anginal sx.  He will continue on BB, ASA, long acting nitrate and statin.   6.  HTN - Bp is controlled on exam today.  He will continue on Carvedilol 71mg B68mnd Hydralazine  71mg TI63m 7.  OSA - the patient is tolerating PAP therapy well without any problems.  The patient has been using and benefiting from PAP use and will continue to benefit from therapy.  8.  Hyperlipidemia - LDL goal is < 70.  He has had some problems with getting his triglycerides down.  We discussed cutting out carbs and sugars.  He will continue on atorvastatin 84m daily and fish oil.  Vascepa cost prohibitive.   Medication Adjustments/Labs and Tests Ordered: Current medicines are reviewed at length with the patient today.  Concerns regarding medicines are outlined above.  Orders Placed This Encounter  Procedures  . Lipid panel  . Hepatic function panel   No orders of the defined types were placed in this encounter.   Signed, TFransico Him MD  08/02/2018 4:34 PM    [place]

## 2018-08-01 ENCOUNTER — Ambulatory Visit: Payer: Medicare Other | Admitting: Cardiology

## 2018-08-01 ENCOUNTER — Encounter: Payer: Self-pay | Admitting: Cardiology

## 2018-08-01 VITALS — BP 116/66 | HR 66 | Ht 67.0 in | Wt 245.0 lb

## 2018-08-01 DIAGNOSIS — I2510 Atherosclerotic heart disease of native coronary artery without angina pectoris: Secondary | ICD-10-CM

## 2018-08-01 DIAGNOSIS — I350 Nonrheumatic aortic (valve) stenosis: Secondary | ICD-10-CM | POA: Diagnosis not present

## 2018-08-01 DIAGNOSIS — I480 Paroxysmal atrial fibrillation: Secondary | ICD-10-CM | POA: Diagnosis not present

## 2018-08-01 DIAGNOSIS — I5042 Chronic combined systolic (congestive) and diastolic (congestive) heart failure: Secondary | ICD-10-CM

## 2018-08-01 DIAGNOSIS — I10 Essential (primary) hypertension: Secondary | ICD-10-CM

## 2018-08-01 DIAGNOSIS — I6523 Occlusion and stenosis of bilateral carotid arteries: Secondary | ICD-10-CM

## 2018-08-01 DIAGNOSIS — G4733 Obstructive sleep apnea (adult) (pediatric): Secondary | ICD-10-CM

## 2018-08-01 DIAGNOSIS — E785 Hyperlipidemia, unspecified: Secondary | ICD-10-CM

## 2018-08-01 LAB — LIPID PANEL
CHOL/HDL RATIO: 2.6 ratio (ref 0.0–5.0)
Cholesterol, Total: 116 mg/dL (ref 100–199)
HDL: 44 mg/dL (ref >39)
LDL Calculated: 46 mg/dL (ref 0–99)
Triglycerides: 128 mg/dL (ref 0–149)
VLDL Cholesterol Cal: 26 mg/dL (ref 5–40)

## 2018-08-01 LAB — HEPATIC FUNCTION PANEL
ALT: 24 IU/L (ref 0–44)
AST: 27 IU/L (ref 0–40)
Albumin: 4.8 g/dL — ABNORMAL HIGH (ref 3.7–4.7)
Alkaline Phosphatase: 64 IU/L (ref 39–117)
Bilirubin Total: 0.8 mg/dL (ref 0.0–1.2)
Bilirubin, Direct: 0.23 mg/dL (ref 0.00–0.40)
Total Protein: 6.9 g/dL (ref 6.0–8.5)

## 2018-08-01 NOTE — Patient Instructions (Signed)
Medication Instructions:  Your physician recommends that you continue on your current medications as directed. Please refer to the Current Medication list given to you today.  If you need a refill on your cardiac medications before your next appointment, please call your pharmacy.   Lab work: Today: LFT and Lipid  If you have labs (blood work) drawn today and your tests are completely normal, you will receive your results only by: Marland Kitchen MyChart Message (if you have MyChart) OR . A paper copy in the mail If you have any lab test that is abnormal or we need to change your treatment, we will call you to review the results.  Testing/Procedures: None  Follow-Up: At [facility], you and your health needs are our priority.  As part of our continuing mission to provide you with exceptional heart care, we have created designated Provider Care Teams.  These Care Teams include your primary Cardiologist (physician) and Advanced Practice Providers (APPs -  Physician Assistants and Nurse Practitioners) who all work together to provide you with the care you need, when you need it.  Your physician wants you to follow-up in: 6 month with Jacolyn Reedy PA-C. You will receive a reminder letter in the mail two months in advance. If you don't receive a letter, please call our office to schedule the follow-up appointment.  You will need a follow up appointment in 1 years.  Please call our office 2 months in advance to schedule this appointment.  You may see Armanda Magic, MD  or one of the following Advanced Practice Providers on your designated Care Team:   [place], PA-C Ronie Spies, PA-C . Jacolyn Reedy, PA-C

## 2018-08-02 ENCOUNTER — Encounter (HOSPITAL_COMMUNITY)
Admission: RE | Admit: 2018-08-02 | Discharge: 2018-08-02 | Disposition: A | Discharge status: Home / Self Care | Payer: Self-pay | Source: Ambulatory Visit | Attending: Cardiology | Admitting: Cardiology

## 2018-08-05 ENCOUNTER — Encounter (HOSPITAL_COMMUNITY)
Admission: RE | Admit: 2018-08-05 | Discharge: 2018-08-05 | Disposition: A | Discharge status: Home / Self Care | Payer: Self-pay | Source: Ambulatory Visit | Attending: Cardiology | Admitting: Cardiology

## 2018-08-05 ENCOUNTER — Ambulatory Visit (INDEPENDENT_AMBULATORY_CARE_PROVIDER_SITE_OTHER): Payer: Medicare Other | Admitting: *Deleted

## 2018-08-05 DIAGNOSIS — I442 Atrioventricular block, complete: Secondary | ICD-10-CM | POA: Diagnosis not present

## 2018-08-05 DIAGNOSIS — I495 Sick sinus syndrome: Secondary | ICD-10-CM

## 2018-08-06 LAB — CUP PACEART REMOTE DEVICE CHECK
Battery Impedance: 678 Ohm
Battery Voltage: 2.77 V
Brady Statistic AP VP Percent: 0 %
Brady Statistic AP VS Percent: 68 %
Brady Statistic AS VP Percent: 0 %
Brady Statistic AS VS Percent: 31 %
Date Time Interrogation Session: 20200309194702
Implantable Lead Implant Date: 20020926
Implantable Lead Implant Date: 20020926
Implantable Lead Location: 753859
Implantable Lead Location: 753860
Implantable Lead Model: 5076
Implantable Lead Model: 5092
Implantable Pulse Generator Implant Date: 20120905
Lead Channel Impedance Value: 511 Ohm
Lead Channel Impedance Value: 930 Ohm
Lead Channel Pacing Threshold Amplitude: 0.5 V
Lead Channel Pacing Threshold Amplitude: 1 V
Lead Channel Pacing Threshold Pulse Width: 0.4 ms
Lead Channel Pacing Threshold Pulse Width: 0.4 ms
Lead Channel Setting Pacing Amplitude: 2.5 V
Lead Channel Setting Pacing Pulse Width: 0.4 ms
MDC IDC MSMT BATTERY REMAINING LONGEVITY: 81 mo
MDC IDC SET LEADCHNL RA PACING AMPLITUDE: 2 V
MDC IDC SET LEADCHNL RV SENSING SENSITIVITY: 4 mV

## 2018-08-07 ENCOUNTER — Encounter (HOSPITAL_COMMUNITY)
Admission: RE | Admit: 2018-08-07 | Discharge: 2018-08-07 | Disposition: A | Discharge status: Home / Self Care | Payer: Self-pay | Source: Ambulatory Visit | Attending: Cardiology | Admitting: Cardiology

## 2018-08-07 ENCOUNTER — Other Ambulatory Visit: Payer: Self-pay

## 2018-08-09 ENCOUNTER — Encounter (HOSPITAL_COMMUNITY): Payer: Self-pay

## 2018-08-09 ENCOUNTER — Telehealth (HOSPITAL_COMMUNITY): Payer: Self-pay | Admitting: Family Medicine

## 2018-08-12 ENCOUNTER — Encounter (HOSPITAL_COMMUNITY): Payer: Self-pay

## 2018-08-12 ENCOUNTER — Telehealth (HOSPITAL_COMMUNITY): Payer: Self-pay | Admitting: Family Medicine

## 2018-08-12 ENCOUNTER — Telehealth (HOSPITAL_COMMUNITY): Payer: Self-pay | Admitting: *Deleted

## 2018-08-12 NOTE — Telephone Encounter (Signed)
Contacted patient to notify of Cardiac Rehab department closure x2 weeks. Message left on patient's voicemail. Damen Windsor, Exercise Physiologist Cardiac and Pulmonary Rehabilitaiton 

## 2018-08-12 NOTE — Progress Notes (Signed)
Remote pacemaker transmission.   

## 2018-08-14 ENCOUNTER — Encounter (HOSPITAL_COMMUNITY): Payer: Self-pay

## 2018-08-16 ENCOUNTER — Encounter (HOSPITAL_COMMUNITY): Payer: Self-pay

## 2018-08-19 ENCOUNTER — Encounter (HOSPITAL_COMMUNITY): Payer: Self-pay

## 2018-08-20 ENCOUNTER — Telehealth (HOSPITAL_COMMUNITY): Payer: Self-pay | Admitting: *Deleted

## 2018-08-20 NOTE — Telephone Encounter (Signed)
Called to notify patient that the cardiac and pulmonary rehabilitation department will be closed for 4 weeks due to COVID-19 restrictions. Pt verbalized understanding.  Nataya Bastedo M Dontarius Sheley, MS, ACSM CEP  

## 2018-08-21 ENCOUNTER — Encounter (HOSPITAL_COMMUNITY): Payer: Self-pay

## 2018-08-22 ENCOUNTER — Telehealth: Payer: Self-pay

## 2018-08-22 NOTE — Telephone Encounter (Signed)

## 2018-08-23 ENCOUNTER — Ambulatory Visit (INDEPENDENT_AMBULATORY_CARE_PROVIDER_SITE_OTHER): Payer: Medicare Other | Admitting: *Deleted

## 2018-08-23 ENCOUNTER — Encounter (HOSPITAL_COMMUNITY): Payer: Self-pay

## 2018-08-23 DIAGNOSIS — Z5181 Encounter for therapeutic drug level monitoring: Secondary | ICD-10-CM | POA: Diagnosis not present

## 2018-08-23 DIAGNOSIS — I480 Paroxysmal atrial fibrillation: Secondary | ICD-10-CM

## 2018-08-23 LAB — POCT INR: INR: 2.9 (ref 2.0–3.0)

## 2018-08-23 NOTE — Patient Instructions (Signed)
Description   Continue taking on same dosage 1.5 tablets every day except 1 tablet on Tuesdays and  Thursdays.  Recheck INR in 6-8 weeks.  Coumadin clinic [phone].

## 2018-08-26 ENCOUNTER — Encounter (HOSPITAL_COMMUNITY): Payer: Self-pay

## 2018-08-28 ENCOUNTER — Encounter (HOSPITAL_COMMUNITY): Payer: Self-pay

## 2018-08-30 ENCOUNTER — Encounter (HOSPITAL_COMMUNITY): Payer: Self-pay

## 2018-08-30 ENCOUNTER — Other Ambulatory Visit: Payer: Self-pay | Admitting: Cardiology

## 2018-08-30 NOTE — Telephone Encounter (Signed)
Pt is requesting refill for WARFARIN 5 mg please address thank you.

## 2018-09-02 ENCOUNTER — Encounter (HOSPITAL_COMMUNITY): Payer: Self-pay

## 2018-09-04 ENCOUNTER — Encounter (HOSPITAL_COMMUNITY): Payer: Self-pay

## 2018-09-06 ENCOUNTER — Telehealth (HOSPITAL_COMMUNITY): Payer: Self-pay | Admitting: *Deleted

## 2018-09-06 ENCOUNTER — Encounter (HOSPITAL_COMMUNITY): Payer: Self-pay

## 2018-09-06 NOTE — Telephone Encounter (Signed)
Called to notify patient that the cardiac and pulmonary rehabilitation department remains closed at this time due to COVID-19 restrictions.  Pt verbalized understanding.  Artist Pais, MS, ACSM CEP 09/06/2018 1118

## 2018-09-09 ENCOUNTER — Encounter (HOSPITAL_COMMUNITY): Payer: Self-pay

## 2018-09-11 ENCOUNTER — Encounter (HOSPITAL_COMMUNITY): Payer: Self-pay

## 2018-09-13 ENCOUNTER — Encounter (HOSPITAL_COMMUNITY): Payer: Self-pay

## 2018-09-16 ENCOUNTER — Encounter (HOSPITAL_COMMUNITY): Payer: Self-pay

## 2018-09-18 ENCOUNTER — Telehealth: Payer: Self-pay | Admitting: Cardiology

## 2018-09-18 ENCOUNTER — Encounter (HOSPITAL_COMMUNITY): Payer: Self-pay

## 2018-09-18 NOTE — Telephone Encounter (Signed)
If Lovaza is affordable, it may be more effective in lowering TG than the OTC fish oil. However, Vascepa is the only medication targeting TG that has been proven to lower cardiovascular risk. TG are not at a dangerously high level.   Discussed with patient who states that Lovaza was expensive as well. States that his levels are always higher when he gets tested at [facility]. His TG were 128 5 weeks ago. Could be what he ate the night before? If he has a large amount of carbs/sugars. He doesn't remember what he ate. Discussed the lack of evidence thats medications like Lovaza, fenofibrate, OTC fish oil prolong our lives and decrease cardiovascular risk (with the exception of Vascepa). Therefore, I do not feel as though we need to be aggressive in a pharmacological manner since Vascepa is not affordable. He should  focus on his diet, limiting carbs (pastas, rice, breads, potatoes) and sugary treats. Patient states he doesn't drink alcohol. Patient in agreement with plan.

## 2018-09-18 NOTE — Telephone Encounter (Signed)
New Message    Pt said he had blood work done at his Primary cares office and said his kidney function was abnormal and told him to follow up with cardiology    Please call

## 2018-09-18 NOTE — Telephone Encounter (Signed)
Labs compared to prior BMET and renal function is stable on diuretics.  Would not change anything at this time unless patient is having problems.  LDL is at goal.  His triglycerides are elevated.  Please send this to Landis Martins PharmD for guidance.

## 2018-09-18 NOTE — Telephone Encounter (Signed)
Labs have been scanned into chart from 09/10/2018, BUN was 31 and creat was 1.56. I will forward to Dr [name] for further advice.

## 2018-09-18 NOTE — Telephone Encounter (Signed)
Pt was called and updated. Note to be forwarded to pharmD

## 2018-09-20 ENCOUNTER — Encounter (HOSPITAL_COMMUNITY): Payer: Self-pay

## 2018-09-23 ENCOUNTER — Encounter (HOSPITAL_COMMUNITY): Payer: Self-pay

## 2018-09-25 ENCOUNTER — Encounter (HOSPITAL_COMMUNITY): Payer: Self-pay

## 2018-10-15 ENCOUNTER — Telehealth: Payer: Self-pay | Admitting: Pharmacist

## 2018-10-15 NOTE — Telephone Encounter (Signed)

## 2018-10-18 ENCOUNTER — Ambulatory Visit (INDEPENDENT_AMBULATORY_CARE_PROVIDER_SITE_OTHER): Payer: Medicare Other | Admitting: Pharmacist

## 2018-10-18 ENCOUNTER — Other Ambulatory Visit: Payer: Self-pay

## 2018-10-18 DIAGNOSIS — Z5181 Encounter for therapeutic drug level monitoring: Secondary | ICD-10-CM

## 2018-10-18 DIAGNOSIS — I480 Paroxysmal atrial fibrillation: Secondary | ICD-10-CM

## 2018-10-18 LAB — POCT INR: INR: 2.3 (ref 2.0–3.0)

## 2018-11-04 ENCOUNTER — Ambulatory Visit (INDEPENDENT_AMBULATORY_CARE_PROVIDER_SITE_OTHER): Payer: Medicare Other | Admitting: *Deleted

## 2018-11-04 DIAGNOSIS — I442 Atrioventricular block, complete: Secondary | ICD-10-CM

## 2018-11-04 DIAGNOSIS — I495 Sick sinus syndrome: Secondary | ICD-10-CM

## 2018-11-05 LAB — CUP PACEART REMOTE DEVICE CHECK
Battery Impedance: 779 Ohm
Battery Remaining Longevity: 75 mo
Battery Voltage: 2.77 V
Brady Statistic AP VP Percent: 0 %
Brady Statistic AP VS Percent: 74 %
Brady Statistic AS VP Percent: 0 %
Brady Statistic AS VS Percent: 26 %
Date Time Interrogation Session: 20200608165944
Implantable Lead Implant Date: 20020926
Implantable Lead Implant Date: 20020926
Implantable Lead Location: 753859
Implantable Lead Location: 753860
Implantable Lead Model: 5076
Implantable Lead Model: 5092
Implantable Pulse Generator Implant Date: 20120905
Lead Channel Impedance Value: 504 Ohm
Lead Channel Impedance Value: 927 Ohm
Lead Channel Pacing Threshold Amplitude: 0.625 V
Lead Channel Pacing Threshold Amplitude: 0.875 V
Lead Channel Pacing Threshold Pulse Width: 0.4 ms
Lead Channel Pacing Threshold Pulse Width: 0.4 ms
Lead Channel Setting Pacing Amplitude: 2 V
Lead Channel Setting Pacing Amplitude: 2.5 V
Lead Channel Setting Pacing Pulse Width: 0.4 ms
Lead Channel Setting Sensing Sensitivity: 4 mV

## 2018-11-11 NOTE — Progress Notes (Signed)
Remote pacemaker transmission.   

## 2018-11-21 ENCOUNTER — Telehealth (HOSPITAL_COMMUNITY): Payer: Self-pay | Admitting: *Deleted

## 2018-11-21 NOTE — Telephone Encounter (Signed)
PC to pt re: maintenance at cardiac rehab.  Pt notified CR maintenance is still on hold at this time due to CoVID-19 pandemic.  Verbalized understanding. Pt remains interested in returning to maintenance program when it reopens.

## 2018-11-27 ENCOUNTER — Other Ambulatory Visit: Payer: Self-pay | Admitting: Cardiology

## 2018-12-09 ENCOUNTER — Telehealth: Payer: Self-pay | Admitting: *Deleted

## 2018-12-09 NOTE — Telephone Encounter (Signed)

## 2018-12-13 ENCOUNTER — Other Ambulatory Visit: Payer: Self-pay

## 2018-12-13 ENCOUNTER — Ambulatory Visit (INDEPENDENT_AMBULATORY_CARE_PROVIDER_SITE_OTHER): Payer: Medicare Other | Admitting: *Deleted

## 2018-12-13 DIAGNOSIS — Z5181 Encounter for therapeutic drug level monitoring: Secondary | ICD-10-CM | POA: Diagnosis not present

## 2018-12-13 DIAGNOSIS — I480 Paroxysmal atrial fibrillation: Secondary | ICD-10-CM

## 2018-12-13 LAB — POCT INR: INR: 2.3 (ref 2.0–3.0)

## 2018-12-13 NOTE — Patient Instructions (Signed)
Description   Continue taking same dosage 1.5 tablets every day except 1 tablet on Tuesdays and  Thursdays.  Recheck INR in 8 weeks.  Coumadin clinic [phone].

## 2018-12-18 ENCOUNTER — Other Ambulatory Visit: Payer: Self-pay | Admitting: Cardiology

## 2018-12-31 ENCOUNTER — Other Ambulatory Visit: Payer: Self-pay | Admitting: Cardiology

## 2018-12-31 MED ORDER — FUROSEMIDE 20 MG PO TABS: ORAL_TABLET | ORAL | 2 refills | Status: DC

## 2018-12-31 MED ORDER — FUROSEMIDE 80 MG PO TABS: ORAL_TABLET | ORAL | 2 refills | Status: DC

## 2019-01-19 ENCOUNTER — Other Ambulatory Visit: Payer: Self-pay | Admitting: Cardiology

## 2019-01-28 ENCOUNTER — Encounter: Payer: Self-pay | Admitting: Physician Assistant

## 2019-01-28 ENCOUNTER — Other Ambulatory Visit: Payer: Self-pay

## 2019-01-28 ENCOUNTER — Ambulatory Visit (INDEPENDENT_AMBULATORY_CARE_PROVIDER_SITE_OTHER): Payer: Medicare Other | Admitting: Physician Assistant

## 2019-01-28 VITALS — BP 122/66 | HR 88 | Ht 67.0 in | Wt 241.1 lb

## 2019-01-28 DIAGNOSIS — I2510 Atherosclerotic heart disease of native coronary artery without angina pectoris: Secondary | ICD-10-CM

## 2019-01-28 DIAGNOSIS — I350 Nonrheumatic aortic (valve) stenosis: Secondary | ICD-10-CM | POA: Diagnosis not present

## 2019-01-28 DIAGNOSIS — Z950 Presence of cardiac pacemaker: Secondary | ICD-10-CM

## 2019-01-28 DIAGNOSIS — E785 Hyperlipidemia, unspecified: Secondary | ICD-10-CM

## 2019-01-28 DIAGNOSIS — I480 Paroxysmal atrial fibrillation: Secondary | ICD-10-CM

## 2019-01-28 DIAGNOSIS — I10 Essential (primary) hypertension: Secondary | ICD-10-CM

## 2019-01-28 NOTE — Progress Notes (Signed)
Cardiology Office Note    Date:  01/28/2019   ID:  Brandon Klein, DOB [birth year], MRN [id number]  PCP:  Juluis RainierBarnes, Elizabeth, MD  Cardiologist: Armanda Magicraci Turner, MD EPS: None  Chief Complaint  Patient presents with  . Follow-up    History of Present Illness:  Brandon Klein is a 78 y.o. male with history of CAD status post stenting to the LAD and balloon angioplasty of the circumflex followed by CABG x 4 10/2013 with a LIMA to the LAD, SVG to diagonal, SVG to circumflex, SVG to PDA with normal LV function at that time.  Pacemaker for bradycardia.  Severe LV dysfunction found 6 months after CABG and cath 07/07/2014 showed occluded SVG to the PDA all other grafts patent.  Was felt he had a nonischemic cardiomyopathy possibly secondary to worsening aortic stenosis.  Follow-up echo LVEF 35 to 40% so ICD was not indicated.  He had NSVT on pacer check and underwent stress testing that showed no ischemia.  Most recent echo 01/2018 LVEF 45% with moderate aortic stenosis.  Patient last saw Dr. Mayford Knifeurner 08/01/2018 at which time he was concerned about his high triglycerides and Vascepa was cost prohibitive so he was placed on fish oil.   Patient here for f/u. Complains of "pandemic fatigue". Did get to go to [place]. Had a "Cold empty weak feeling in his chest when climbing in a day bed while up there. Has done a lot of physical activity since then without problems. Feels like he's lost a lot of endurance since rehab program shut down. Lots of arthritis in right knee. Is seeing orthopedist. Had a cortisone shot and using a cane.Feels depressed, Had to quit golf. Labs 09/10/18 trig 216    Past Medical History:  Diagnosis Date  . AS (aortic stenosis)    moderate AS 2018  . Carotid artery plaque    a. Duplex 10/2013: mild calcific plaque origin ICA. Left: intimal wall thickening CCA. 0-39% BICA.  Marland Kitchen. Chronic renal insufficiency   . Chronic systolic dysfunction of left ventricle   . Coronary artery disease    a. s/p  PCI of LAD 1997. b. Cutting balloon angioplasty to LCx in 2002. c. s/p cath 11/2013 with severe 3 vessel ASCAD s/p CABG with LIMA to LAD, SVG to diag, SVG to left circ and SVG to PDA. d. cath 07/07/2014 occluded SVG to PDA, all other grafts patent. 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 DJD (degenerative joint disease)   . Epistaxis   . GERD (gastroesophageal reflux disease)   . GI bleeding    a.  with benign gastric polypectomy 05/2014.  Marland Kitchen. HLD (hyperlipidemia)   . HTN (hypertension)   . Hypokalemia   . Hypomagnesemia   . Impaired fasting glucose    A1C check every year  . Obesity   . OSA (obstructive sleep apnea)   . Pacemaker    a. 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 b. MDT generator replacement 2012.  Marland Kitchen. PAF (paroxysmal atrial fibrillation) (HCC)    on chronic systemic anticoagulation  . PVC's (premature ventricular contractions)   . Syncope    a. vasovagal with documented bradycardia.  . Type 2 diabetes mellitus without complications (HCC) 12/02/2015    Past Surgical History:  Procedure Laterality Date  . ANGIOPLASTY  1997  . CHOLECYSTECTOMY    . COLONOSCOPY WITH PROPOFOL N/A 05/05/2014   Procedure: COLONOSCOPY WITH PROPOFOL;  Surgeon: Charolett BumpersMartin K Johnson, MD;  Location: [facility];  Service: Endoscopy;  Laterality: N/A;  . CORONARY ARTERY BYPASS GRAFT N/A 12/01/2013   Procedure: CORONARY ARTERY BYPASS GRAFT TIMES FOUR USING LEFT INTERNAL MAMMARY ARTERY TO LAD, SAPHENOUS VEIN GRAFTS TO DIAGONAL, CIRCUMFELX AND POSTERIOR DESCENDING;  Surgeon: Ivin Poot, MD;  Location: [facility];  Service: Open Heart Surgery;  Laterality: N/A;  . ESOPHAGOGASTRODUODENOSCOPY (EGD) WITH PROPOFOL N/A 05/05/2014   Procedure: ESOPHAGOGASTRODUODENOSCOPY (EGD) WITH PROPOFOL;  Surgeon: Garlan Fair, MD;  Location: [facility];  Service: Endoscopy;  Laterality: N/A;  . KNEE ARTHROSCOPY  2000   Left  . LEFT AND RIGHT HEART CATHETERIZATION WITH CORONARY/GRAFT ANGIOGRAM N/A 07/07/2014   Procedure:  LEFT AND RIGHT HEART CATHETERIZATION WITH Beatrix Fetters;  Surgeon: Burnell Blanks, MD;  Location: [facility] CATH LAB;  Service: Cardiovascular;  Laterality: N/A;  . LEFT HEART CATHETERIZATION WITH CORONARY ANGIOGRAM N/A 11/25/2013   Procedure: LEFT HEART CATHETERIZATION WITH CORONARY ANGIOGRAM;  Surgeon: Sueanne Margarita, MD;  Location: [place] CATH LAB;  Service: Cardiovascular;  Laterality: N/A;  . PACEMAKER INSERTION  2002   generator change MDT by Dr Caryl Comes in 2012  . TEE WITHOUT CARDIOVERSION N/A 08/31/2014   Procedure: TRANSESOPHAGEAL ECHOCARDIOGRAM (TEE);  Surgeon: Sueanne Margarita, MD;  Location: [facility] ENDOSCOPY;  Service: Cardiovascular;  Laterality: N/A;    Current Medications: Current Meds  Medication Sig  . aspirin EC 81 MG EC tablet Take 1 tablet (81 mg total) by mouth daily.  Marland Kitchen atorvastatin (LIPITOR) 80 MG tablet TAKE 1 TABLET BY MOUTH  DAILY  . carvedilol (COREG) 25 MG tablet TAKE 1 TABLET(25 MG) BY MOUTH TWICE DAILY WITH A MEAL  . docusate sodium (COLACE) 100 MG capsule Take 100 mg by mouth 2 (two) times daily as needed for mild constipation.  . fexofenadine (ALLEGRA) 180 MG tablet Take 180 mg by mouth every morning.   . fluticasone (FLONASE) 50 MCG/ACT nasal spray Place 2 sprays into the nose every morning.   . furosemide (LASIX) 20 MG tablet TAKE 1 TABLET BY MOUTH TWICE DAILY. WITH 80 MG TO TOTAL 100MG   . furosemide (LASIX) 80 MG tablet TAKE 1 TABLET BY MOUTH TWICE DAILY. TAKE WITH 20 MG TABLET  . Glucosamine-Chondroit-Vit C-Mn (GLUCOSAMINE CHONDR 1500 COMPLX) CAPS Take 1 capsule by mouth every morning.   . hydrALAZINE (APRESOLINE) 25 MG tablet TAKE 1 TABLET(25 MG) BY MOUTH THREE TIMES DAILY  . isosorbide mononitrate (IMDUR) 30 MG 24 hr tablet TAKE 1 TABLET BY MOUTH EVERY DAY  . L-Lysine 500 MG CAPS Take 1 capsule by mouth every morning.   . meclizine (ANTIVERT) 25 MG tablet Take 25 mg by mouth daily as needed for dizziness.   . Multiple Vitamin (MULTIVITAMIN) tablet Take 1  tablet by mouth every morning.   . nitroGLYCERIN (NITROSTAT) 0.4 MG SL tablet Place 1 tablet (0.4 mg total) under the tongue every 5 (five) minutes as needed for chest pain.  . Omega-3 Fatty Acids (FISH OIL) 1000 MG CPDR Take 2 capsules by mouth 2 (two) times daily.  . ONE TOUCH ULTRA TEST test strip 1 each by Other route every morning.   Glory Rosebush DELICA LANCETS FINE MISC 1 each by Other route every morning.   . pantoprazole (PROTONIX) 40 MG tablet Take 40 mg by mouth every morning.   . potassium chloride SA (K-DUR) 20 MEQ tablet TAKE 2 TABLETS BY MOUTH TWICE DAILY  . RA NATURAL MAGNESIUM 250 MG TABS TAKE 2 TABLETS TWICE DAILY.  Marland Kitchen spironolactone (ALDACTONE) 25 MG tablet TAKE 1 TABLET(25 MG) BY MOUTH DAILY  .  warfarin (COUMADIN) 5 MG tablet Take 1 to 1.5 tablets daily as directed by Coumadin clinic     Allergies:   Acetaminophen-codeine, Amoxicillin, and Diovan [valsartan]   Social History   Socioeconomic History  . Marital status: Married    Spouse name: Not on file  . Number of children: Not on file  . Years of education: Not on file  . Highest education level: Not on file  Occupational History  . Not on file  Social Needs  . Financial resource strain: Not on file  . Food insecurity    Worry: Not on file    Inability: Not on file  . Transportation needs    Medical: Not on file    Non-medical: Not on file  Tobacco Use  . Smoking status: Former Smoker    Packs/day: 2.00    Years: 5.00    Pack years: 10.00    Types: Cigarettes    Quit date: 05/29/1966    Years since quitting: 52.7  . Smokeless tobacco: Never Used  Substance and Sexual Activity  . Alcohol use: Yes    Alcohol/week: 0.0 standard drinks    Comment: ocassional  . Drug use: No  . Sexual activity: Not on file  Lifestyle  . Physical activity    Days per week: Not on file    Minutes per session: Not on file  . Stress: Not on file  Relationships  . Social Musicianconnections    Talks on phone: Not on file    Gets  together: Not on file    Attends religious service: Not on file    Active member of club or organization: Not on file    Attends meetings of clubs or organizations: Not on file    Relationship status: Not on file  Other Topics Concern  . Not on file  Social History Narrative   [place] in [place].  Retired Medical illustratorsalesman     Family History:  The patient's family history includes Colon cancer in his mother; Heart attack in his brother and mother; Leukemia in his father.   ROS:   Please see the history of present illness.    ROS All other systems reviewed and are negative.   PHYSICAL EXAM:   VS:  BP 122/66   Pulse 88   Ht 5\' 7"  (1.702 m)   Wt 241 lb 1.9 oz (109.4 kg)   SpO2 95%   BMI 37.76 kg/m   Physical Exam  GEN: Well nourished, well developed, in no acute distress  Neck: no JVD, carotid bruits, or masses Cardiac:RRR; no murmurs, rubs, or gallops  Respiratory:  clear to auscultation bilaterally, normal work of breathing GI: soft, nontender, nondistended, + BS Ext: without cyanosis, clubbing, or edema, Good distal pulses bilaterally Neuro:  Alert and Oriented x 3 Psych: euthymic mood, full affect  Wt Readings from Last 3 Encounters:  01/28/19 241 lb 1.9 oz (109.4 kg)  08/01/18 245 lb (111.1 kg)  06/18/18 242 lb 6.4 oz (110 kg)      Studies/Labs Reviewed:   EKG:  EKG is not ordered today.    Recent Labs: 08/01/2018: ALT 24   Lipid Panel    Component Value Date/Time   CHOL 116 08/01/2018 1111   CHOL 113 07/22/2014 1226   TRIG 128 08/01/2018 1111   TRIG 91 07/22/2014 1226   HDL 44 08/01/2018 1111   HDL 40 07/22/2014 1226   CHOLHDL 2.6 08/01/2018 1111   CHOLHDL 3.4 06/04/2015 1216   VLDL  39 (H) 06/04/2015 1216   LDLCALC 46 08/01/2018 1111   LDLCALC 55 07/22/2014 1226   LDLDIRECT 47.9 07/14/2013 0937    Additional studies/ records that were reviewed today include:     Carotids 06/06/18 Summary: Right Carotid: Velocities in the right ICA are  consistent with a 1-39% stenosis.                The RICA velocities remain within normal range and have decreased                compared to the prior exam.   Left Carotid: Velocities in the left ICA are consistent with a 1-39% stenosis.               The LICA velocities remain within normal range and have decreased               compared to the prior exam.   Vertebrals:  Bilateral vertebral arteries demonstrate antegrade flow. Subclavians: Normal flow hemodynamics were seen in bilateral subclavian              arteries.   *See table(s) above for measurements and observations.       Electronically signed by Tobias Alexander MD on 06/06/2018 at 4:55:23 PM.   Echo 9/17/19Study Conclusions   - Left ventricle: The cavity size was mildly dilated. Wall   thickness was increased in a pattern of mild LVH. Indeterminant   diastolic function. The estimated ejection fraction was 45%.   Diffuse hypokinesis. Septal-lateral dyssynchrony. - Aortic valve: Trileaflet; severely calcified leaflets. There was   moderate stenosis. Mean gradient (S): 27 mm Hg. Peak gradient   (S): 44 mm Hg. Valve area (VTI): 1.36 cm^2. - Mitral valve: Mildly calcified annulus. There was trivial   regurgitation. - Left atrium: The atrium was mildly dilated. - Right ventricle: Pacer wire or catheter noted in right ventricle.   Systolic function was mildly to moderately reduced. - Right atrium: The atrium was mildly dilated. - Tricuspid valve: Peak RV-RA gradient (S): 19 mm Hg. - Pulmonary arteries: PA peak pressure: 22 mm Hg (S). - Inferior vena cava: The vessel was normal in size. The   respirophasic diameter changes were in the normal range (>= 50%),   consistent with normal central venous pressure.   Impressions:   - Mildly dilated LV with mild LV hypertrophy. EF 45%, diffuse   hypokinesis with septal-lateral dyssynchrony. Normal RV size with   mild to moderately decreased systolic function. Moderate aortic    stenosis.    2D echo 9/2018Study Conclusions   - Left ventricle: Abnormal septal motion mid and basal inferior   wall hypokinesis. Systolic function was mildly reduced. The   estimated ejection fraction was in the range of 45% to 50%.   Doppler parameters are consistent with abnormal left ventricular   relaxation (grade 1 diastolic dysfunction). - Aortic valve: Gradients actually appear lower than those recorded   on 02/07/16. There was moderate stenosis. Valve area (VTI): 0.74   cm^2. Valve area (Vmax): 0.7 cm^2. Valve area (Vmean): 0.74 cm^2. - Left atrium: The atrium was moderately dilated. - Atrial septum: No defect or patent foramen ovale was identified.     Nuclear stress test 10/2018Study Highlights       Nuclear stress EF: 42%. Ejection fraction on echocardiogram was 45%.  There was no ST segment deviation noted during stress.  This is a low risk study. No perfusion defects identified. Ejection fraction is mildly reduced, consistent with echocardiogram.  Donato Schultz, MD      Cardiac catheterization [phone] Ventricular Angiogram: Deferred   Impression: 1. 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 Moderately severe distal RCA stenosis, this vessel is no longer protected by the graft 3. Ischemic cardiomyopathy 4. Moderate aortic stenosis although severity may be underestimated given low LVEF   Recommendations: He appears to remain volume overloaded. I would resume diuresis today. I cannot explain the drop in his LVEF by the occlusion of the graft to the PDA. His native RCA has moderately severe distal disease but good flow. This could be easily approached with PCI in the future. For now, would not recommend PCI of the RCA.        Complications:  None; patient tolerated the procedure well.                 ASSESSMENT:    1. Coronary artery disease involving native coronary artery of native heart without angina pectoris   2. Essential  hypertension   3. PAF (paroxysmal atrial fibrillation) (HCC)   4. Aortic valve stenosis, etiology of cardiac valve disease unspecified   5. Dyslipidemia   6. Pacemaker      PLAN:  In order of problems listed above:  CAD with prior stenting of the LAD and PTCA of the circumflex followed by CABG x4 in 2015, last cath 2016 with 3/4 patent bypass grafts moderate severe distal RCA stenosis no longer protected by the graft, ischemic cardiomyopathy.  No angina.Hoping to start maintenance program with cardiac rehab soon. F/u with Dr. [name] 6 months.  PAF chadsvasc equals 6 on Coumadin-no significant palpitations.   Moderate aortic stenosis on echo 01/2018-will f/u echo  Chronic combined systolic and diastolic CHF LVEF 45% on echo  Essential hypertension BP stable  Bilateral carotid stenosis-actual decreased from before 05/2018  Medication Adjustments/Labs and Tests Ordered: Current medicines are reviewed at length with the patient today.  Concerns regarding medicines are outlined above.  Medication changes, Labs and Tests ordered today are listed in the Patient Instructions below. Patient Instructions  Medication Instructions:  Your physician recommends that you continue on your current medications as directed. Please refer to the Current Medication list given to you today.  If you need a refill on your cardiac medications before your next appointment, please call your pharmacy.   Lab work: None Ordered  If you have labs (blood work) drawn today and your tests are completely normal, you will receive your results only by: Marland Kitchen MyChart Message (if you have MyChart) OR . A paper copy in the mail If you have any lab test that is abnormal or we need to change your treatment, we will call you to review the results.  Testing/Procedures: Your physician has requested that you have an echocardiogram. Echocardiography is a painless test that uses sound waves to create images of your heart. It  provides your doctor with information about the size and shape of your heart and how well your heart's chambers and valves are working. This procedure takes approximately one hour. There are no restrictions for this procedure.  Follow-Up: At [facility], you and your health needs are our priority.  As part of our continuing mission to provide you with exceptional heart care, we have created designated Provider Care Teams.  These Care Teams include your primary Cardiologist (physician) and Advanced Practice Providers (APPs -  Physician Assistants and Nurse Practitioners) who all work together to provide you with the care you need,  when you need it. . You will need a follow up appointment in 6 months.  Please call our office 2 months in advance to schedule this appointment.  You may see Armanda Magicraci Turner, MD or one of the following Advanced Practice Providers on your designated Care Team:   . Robbie LisBrittainy Simmons, PA-C . Dayna Dunn, PA-C . Jacolyn Reedy , PA-C  Any Other Special Instructions Will Be Listed Below (If Applicable).       Elson ClanSigned,  , PA-C  01/28/2019 2:48 PM    [facility] [address], [place], [place]  [address] Phone: [phone]; Fax: [phone]

## 2019-01-28 NOTE — Patient Instructions (Addendum)
Medication Instructions:  Your physician recommends that you continue on your current medications as directed. Please refer to the Current Medication list given to you today.  If you need a refill on your cardiac medications before your next appointment, please call your pharmacy.   Lab work: None Ordered  If you have labs (blood work) drawn today and your tests are completely normal, you will receive your results only by: Marland Kitchen MyChart Message (if you have MyChart) OR . A paper copy in the mail If you have any lab test that is abnormal or we need to change your treatment, we will call you to review the results.  Testing/Procedures: Your physician has requested that you have an echocardiogram. Echocardiography is a painless test that uses sound waves to create images of your heart. It provides your doctor with information about the size and shape of your heart and how well your heart's chambers and valves are working. This procedure takes approximately one hour. There are no restrictions for this procedure.  Follow-Up: At [facility], you and your health needs are our priority.  As part of our continuing mission to provide you with exceptional heart care, we have created designated Provider Care Teams.  These Care Teams include your primary Cardiologist (physician) and Advanced Practice Providers (APPs -  Physician Assistants and Nurse Practitioners) who all work together to provide you with the care you need, when you need it. . You will need a follow up appointment in 6 months.  Please call our office 2 months in advance to schedule this appointment.  You may see Fransico Him, MD or one of the following Advanced Practice Providers on your designated Care Team:   . Lyda Jester, PA-C . Dayna Dunn, PA-C . Ermalinda Barrios, PA-C  Any Other Special Instructions Will Be Listed Below (If Applicable).

## 2019-01-30 ENCOUNTER — Ambulatory Visit: Payer: Medicare Other | Admitting: Cardiology

## 2019-01-31 ENCOUNTER — Ambulatory Visit (HOSPITAL_COMMUNITY): Payer: Medicare Other | Attending: Cardiovascular Disease

## 2019-01-31 ENCOUNTER — Other Ambulatory Visit: Payer: Self-pay

## 2019-01-31 DIAGNOSIS — I350 Nonrheumatic aortic (valve) stenosis: Secondary | ICD-10-CM | POA: Diagnosis present

## 2019-02-04 ENCOUNTER — Ambulatory Visit (INDEPENDENT_AMBULATORY_CARE_PROVIDER_SITE_OTHER): Payer: Medicare Other | Admitting: *Deleted

## 2019-02-04 DIAGNOSIS — I495 Sick sinus syndrome: Secondary | ICD-10-CM

## 2019-02-04 LAB — CUP PACEART REMOTE DEVICE CHECK
Battery Impedance: 856 Ohm
Battery Remaining Longevity: 71 mo
Battery Voltage: 2.77 V
Brady Statistic AP VP Percent: 0 %
Brady Statistic AP VS Percent: 73 %
Brady Statistic AS VP Percent: 0 %
Brady Statistic AS VS Percent: 26 %
Date Time Interrogation Session: 20200908161854
Implantable Lead Implant Date: 20020926
Implantable Lead Implant Date: 20020926
Implantable Lead Location: 753859
Implantable Lead Location: 753860
Implantable Lead Model: 5076
Implantable Lead Model: 5092
Implantable Pulse Generator Implant Date: 20120905
Lead Channel Impedance Value: 510 Ohm
Lead Channel Impedance Value: 986 Ohm
Lead Channel Pacing Threshold Amplitude: 0.5 V
Lead Channel Pacing Threshold Amplitude: 1 V
Lead Channel Pacing Threshold Pulse Width: 0.4 ms
Lead Channel Pacing Threshold Pulse Width: 0.4 ms
Lead Channel Setting Pacing Amplitude: 2 V
Lead Channel Setting Pacing Amplitude: 2.5 V
Lead Channel Setting Pacing Pulse Width: 0.4 ms
Lead Channel Setting Sensing Sensitivity: 4 mV

## 2019-02-07 ENCOUNTER — Encounter: Payer: Self-pay | Admitting: Cardiovascular Disease

## 2019-02-07 ENCOUNTER — Other Ambulatory Visit: Payer: Self-pay

## 2019-02-07 ENCOUNTER — Ambulatory Visit (INDEPENDENT_AMBULATORY_CARE_PROVIDER_SITE_OTHER): Payer: Medicare Other | Admitting: Cardiovascular Disease

## 2019-02-07 ENCOUNTER — Ambulatory Visit (INDEPENDENT_AMBULATORY_CARE_PROVIDER_SITE_OTHER): Payer: Medicare Other | Admitting: *Deleted

## 2019-02-07 VITALS — BP 98/64 | HR 77 | Ht 67.0 in | Wt 243.8 lb

## 2019-02-07 DIAGNOSIS — I480 Paroxysmal atrial fibrillation: Secondary | ICD-10-CM

## 2019-02-07 DIAGNOSIS — I350 Nonrheumatic aortic (valve) stenosis: Secondary | ICD-10-CM | POA: Diagnosis not present

## 2019-02-07 DIAGNOSIS — Z5181 Encounter for therapeutic drug level monitoring: Secondary | ICD-10-CM

## 2019-02-07 LAB — POCT INR: INR: 2.6 (ref 2.0–3.0)

## 2019-02-07 NOTE — Patient Instructions (Addendum)
Medication Instructions:  Your provider recommends that you continue on your current medications as directed. Please refer to the Current Medication list given to you today.    Labwork: None  Testing/Procedures: None  Follow-Up: You have a follow-up appointment with Dr. Angelena Form on 04/10/2019 at 8:45AM.

## 2019-02-07 NOTE — Progress Notes (Addendum)
[facility] Consult Note  Chief Complaint  Patient presents with  . 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 Mayford Klein, for further discussion regarding his aortic stenosis and possible TAVR. He has CAD and underwent 4V CABG in 2015. He has had a pacemaker implanted remotely for bradycardia and syncope. His LV systolic function was normal prior to his CABG in 2015 but in January 2016 his LVEF was noted to be  20%. Repeat cath with occlusion of the SVG to the PDA. LVEF in 2019 was 45%. His cardiomyopathy is felt to be non-ischemic. He has paroxysmal atrial fibrillation and is on coumadin. His aortic stenosis has been followed for several years. Most recent echo 01/31/19 showed LVEF=45-50%. There is severe aortic stenosis with mean gradient 33 mmHg, peak gradient 54 mmHg. The dimensionless index is 0.16 and the AVA is 0.85 but these measurements are felt to be exaggerated due to incorrect placement of the pulse doppler sample per Dr. Royann Klein who read the echo.   The patient tells me that he has been doing well overall. He has had no exertional chest pain. He has had progressive dyspnea on exertion but overall this is mild. He has been more aware of progressive fatigue. He has been very active in cardiac rehab until the recent Covid pandemic. He has some lower extremity edema but near baseline. He lives in [place] with his wife. 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 He sees the dentist regularly and has no active issues.   Primary Care Physician: Brandon Rainier, MD Primary Cardiologist: Brandon Klein Referring Cardiologist: Brandon Klein   Past Medical History:  Diagnosis Date  . AS (aortic stenosis)    moderate AS 2018  . Carotid artery plaque    a. Duplex 10/2013: mild calcific plaque origin ICA. Left: intimal wall thickening CCA. 0-39% BICA.  Marland Kitchen Chronic renal insufficiency   . Chronic systolic dysfunction of left ventricle   . Coronary artery disease    a. s/p PCI of LAD 1997. b. Cutting balloon angioplasty to LCx in 2002. c. s/p cath 11/2013 with severe 3 vessel ASCAD s/p CABG with LIMA to LAD, SVG to diag, SVG to left circ and SVG to PDA. d. cath 07/07/2014 occluded SVG to PDA, all other grafts patent. 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 DJD (degenerative joint disease)   . Epistaxis   . GERD (gastroesophageal reflux disease)   . GI bleeding    a.  with benign gastric polypectomy 05/2014.  Marland Kitchen HLD (hyperlipidemia)   . HTN (hypertension)   . Hypokalemia   . Hypomagnesemia   . Impaired fasting glucose    A1C check every year  . Obesity   . OSA (obstructive sleep apnea)   . Pacemaker    a. 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 b. MDT generator replacement 2012.  Marland Kitchen PAF (paroxysmal atrial fibrillation) (HCC)    on chronic systemic anticoagulation  . PVC's (premature ventricular contractions)   . Syncope    a. vasovagal with documented bradycardia.  . Type 2 diabetes mellitus without complications (HCC) 12/02/2015    Past Surgical History:  Procedure Laterality Date  .  ANGIOPLASTY  1997  . CHOLECYSTECTOMY    . COLONOSCOPY WITH PROPOFOL N/A 05/05/2014   Procedure: COLONOSCOPY WITH PROPOFOL;  Surgeon: Brandon Fair, MD;  Location: [facility];  Service: Brandon;  Laterality: N/A;  . CORONARY ARTERY BYPASS GRAFT N/A 12/01/2013   Procedure: CORONARY ARTERY BYPASS GRAFT TIMES FOUR USING LEFT INTERNAL MAMMARY ARTERY TO LAD, SAPHENOUS VEIN GRAFTS TO DIAGONAL, CIRCUMFELX AND POSTERIOR DESCENDING;  Surgeon: Brandon Poot, MD;  Location: [facility];  Service: Open Heart Surgery;  Laterality: N/A;  .  ESOPHAGOGASTRODUODENOSCOPY (EGD) WITH PROPOFOL N/A 05/05/2014   Procedure: ESOPHAGOGASTRODUODENOSCOPY (EGD) WITH PROPOFOL;  Surgeon: Brandon Fair, MD;  Location: [facility];  Service: Brandon;  Laterality: N/A;  . KNEE ARTHROSCOPY  2000   Left  . LEFT AND RIGHT HEART CATHETERIZATION WITH CORONARY/GRAFT ANGIOGRAM N/A 07/07/2014   Procedure: LEFT AND RIGHT HEART CATHETERIZATION WITH Brandon [facility];  Surgeon: Brandon Blanks, MD;  Location: [facility];  Service: Cardiovascular;  Laterality: N/A;  . LEFT HEART CATHETERIZATION WITH CORONARY ANGIOGRAM N/A 11/25/2013   Procedure: LEFT HEART CATHETERIZATION WITH CORONARY ANGIOGRAM;  Surgeon: Brandon Margarita, MD;  Location: [facility];  Service: Cardiovascular;  Laterality: N/A;  . PACEMAKER INSERTION  2002   generator change MDT by Dr Brandon Klein in 2012  . TEE WITHOUT CARDIOVERSION N/A 08/31/2014   Procedure: TRANSESOPHAGEAL ECHOCARDIOGRAM (TEE);  Surgeon: Brandon Margarita, MD;  Location: [facility];  Service: Cardiovascular;  Laterality: N/A;    Current Outpatient Medications  Medication Sig Dispense Refill  . acetaminophen (TYLENOL) 500 MG tablet Take 1,000 mg by mouth every 8 (eight) hours as needed.    Marland Kitchen aspirin EC 81 MG EC tablet Take 1 tablet (81 mg total) by mouth daily.    Marland Kitchen atorvastatin (LIPITOR) 80 MG tablet TAKE 1 TABLET BY MOUTH  DAILY 90 tablet 2  . carvedilol (COREG) 25 MG tablet TAKE 1 TABLET(25 MG) BY MOUTH TWICE DAILY WITH A MEAL 180 tablet 2  . docusate sodium (COLACE) 100 MG capsule Take 100 mg by mouth 2 (two) times daily as needed for mild constipation.    . fexofenadine (ALLEGRA) 180 MG tablet Take 180 mg by mouth every morning.     . fluticasone (FLONASE) 50 MCG/ACT nasal spray Place 2 sprays into the nose every morning.     . furosemide (LASIX) 20 MG tablet TAKE 1 TABLET BY MOUTH TWICE DAILY. WITH 80 MG TO TOTAL 100MG  180 tablet 2  . furosemide (LASIX) 80 MG tablet TAKE 1 TABLET BY MOUTH TWICE DAILY. TAKE WITH  20 MG TABLET 180 tablet 2  . Glucosamine-Chondroit-Vit C-Mn (GLUCOSAMINE CHONDR 1500 COMPLX) CAPS Take 1 capsule by mouth every morning.     . hydrALAZINE (APRESOLINE) 25 MG tablet TAKE 1 TABLET(25 MG) BY MOUTH THREE TIMES DAILY 270 tablet 2  . isosorbide mononitrate (IMDUR) 30 MG 24 hr tablet TAKE 1 TABLET BY MOUTH EVERY DAY 90 tablet 2  . L-Lysine 500 MG CAPS Take 1 capsule by mouth every morning.     . meclizine (ANTIVERT) 25 MG tablet Take 25 mg by mouth daily as needed for dizziness.     . Multiple Vitamin (MULTIVITAMIN) tablet Take 1 tablet by mouth every morning.     . nitroGLYCERIN (NITROSTAT) 0.4 MG SL tablet Place 1 tablet (0.4 mg total) under the tongue every 5 (five) minutes as needed for chest pain. 30 tablet 3  . Omega-3 Fatty Acids (FISH OIL) 1000 MG CPDR Take 2 capsules by mouth 2 (two)  times daily.    . ONE TOUCH ULTRA TEST test strip 1 each by Other route every morning.     Letta Pate. ONETOUCH DELICA LANCETS FINE MISC 1 each by Other route every morning.     . pantoprazole (PROTONIX) 40 MG tablet Take 40 mg by mouth every morning.     . potassium chloride SA (K-DUR) 20 MEQ tablet TAKE 2 TABLETS BY MOUTH TWICE DAILY 360 tablet 2  . RA NATURAL MAGNESIUM 250 MG TABS TAKE 2 TABLETS TWICE DAILY. 120 tablet 5  . spironolactone (ALDACTONE) 25 MG tablet TAKE 1 TABLET(25 MG) BY MOUTH DAILY 90 tablet 2  . warfarin (COUMADIN) 5 MG tablet Take 1 to 1.5 tablets daily as directed by Coumadin clinic 135 tablet 0   No current facility-administered medications for this visit.     Allergies  Allergen Reactions  . Acetaminophen-Codeine Other (See Comments)    Other reaction(s): Other (See Comments) Extreme constipation Extreme constipation   . Amoxicillin Itching and Rash    Unknown-maybe a rash?  . Diovan [Valsartan] Other (See Comments)    Unknown     Social History   Socioeconomic History  . Marital status: Married    Spouse name: Not on file  . Number of children: Not on file  .  Years of education: Not on file  . Highest education level: Not on file  Occupational History  . Not on file  Social Needs  . Financial resource strain: Not on file  . Food insecurity    Worry: Not on file    Inability: Not on file  . Transportation needs    Medical: Not on file    Non-medical: Not on file  Tobacco Use  . Smoking status: Former Smoker    Packs/day: 2.00    Years: 5.00    Pack years: 10.00    Types: Cigarettes    Quit date: 05/29/1966    Years since quitting: 52.7  . Smokeless tobacco: Never Used  Substance and Sexual Activity  . Alcohol use: Yes    Alcohol/week: 0.0 standard drinks    Comment: ocassional  . Drug use: No  . Sexual activity: Not on file  Lifestyle  . Physical activity    Days per week: Not on file    Minutes per session: Not on file  . Stress: Not on file  Relationships  . Social Musicianconnections    Talks on phone: Not on file    Gets together: Not on file    Attends religious service: Not on file    Active member of club or organization: Not on file    Attends meetings of clubs or organizations: Not on file    Relationship status: Not on file  . Intimate partner violence    Fear of current or ex partner: Not on file    Emotionally abused: Not on file    Physically abused: Not on file    Forced sexual activity: Not on file  Other Topics Concern  . Not on file  Social History Narrative   [place] in [place].  Retired Medical illustratorsalesman    Family History  Problem Relation Age of Onset  . Colon cancer Mother        deceased  . Heart attack Mother   . Leukemia Father        deceased  . Heart attack Brother        deceased    Review of Systems:  As stated in the HPI  and otherwise negative.   BP 98/64   Pulse 77   Ht 5\' 7"  (1.702 m)   Wt 243 lb 12.8 oz (110.6 kg)   SpO2 97%   BMI 38.18 kg/m   Physical Examination: General: Well developed, well nourished, NAD  HEENT: OP clear, mucus membranes moist  SKIN: warm, dry. No  rashes. Neuro: No focal deficits  Musculoskeletal: Muscle strength 5/5 all ext  Psychiatric: Mood and affect normal  Neck: No JVD, no carotid bruits, no thyromegaly, no lymphadenopathy.  Lungs:Clear bilaterally, no wheezes, rhonci, crackles Cardiovascular: Regular rate and rhythm. Loud, harsh, late peaking systolic murmur.  Abdomen:Soft. Bowel sounds present. Non-tender.  Extremities: No lower extremity edema. Pulses are 2 + in the bilateral DP/PT.  EKG:  EKG is ordered today. The ekg ordered today demonstrates Atrial paced rhythm. RBBB  Echo 01/31/19: 1. The left ventricle has mildly reduced systolic function, with an ejection fraction of 45-50%. The cavity size was normal. There is moderate concentric left ventricular hypertrophy. Left ventricular diastolic Doppler parameters are consistent with  impaired relaxation. There is abnormal septal motion consistent with RV pacemaker. No evidence of left ventricular regional wall motion abnormalities.  2. The right ventricle has normal systolic function. The cavity was normal. There is no increase in right ventricular wall thickness. Right ventricular systolic pressure is normal with an estimated pressure of 26.6 mmHg.  3. Left atrial size was mildly dilated.  4. Mild thickening of the mitral valve leaflet.  5. The aortic valve is tricuspid. Moderate thickening of the aortic valve. Moderate calcification of the aortic valve. Moderate-severe stenosis of the aortic valve.  6. The aorta is normal unless otherwise noted.  7. When compared to the prior study: 02/12/2018, aortic valve gradients have worsened. The dimensionless obstructive index and the AV area calculation exaggerates the reduction in valve area due to erroneous location of pulsed Doppler sample volume  (which underestimates the true stroke volume). Nevertheless, the degree of aortic stenosis is approaching the severe range.  FINDINGS  Left Ventricle: The left ventricle has mildly  reduced systolic function, with an ejection fraction of 45-50%. The cavity size was normal. There is moderate concentric left ventricular hypertrophy. Left ventricular diastolic Doppler parameters are  consistent with impaired relaxation. Normal left ventricular filling pressures There is abnormal (paradoxical) septal motion, consistent with RV pacemaker. No evidence of left ventricular regional wall motion abnormalities.. Septal-lateral dyssynchrony.  Right Ventricle: The right ventricle has normal systolic function. The cavity was normal. There is no increase in right ventricular wall thickness. Right ventricular systolic pressure is normal with an estimated pressure of 26.6 mmHg. Pacing  wire/catheter visualized in the right ventricle.  Left Atrium: Left atrial size was mildly dilated.  Right Atrium: Right atrial size was normal in size. Right atrial pressure is estimated at 10 mmHg. pacemaker lead is seen.. Pacemaker lead is seen.  Interatrial Septum: No atrial level shunt detected by color flow Doppler.  Pericardium: There is no evidence of pericardial effusion.  Mitral Valve: The mitral valve is normal in structure. Mild thickening of the mitral valve leaflet. Mitral valve regurgitation is trivial by color flow Doppler.  Tricuspid Valve: The tricuspid valve is normal in structure. Tricuspid valve regurgitation is mild by color flow Doppler.  Aortic Valve: The aortic valve is tricuspid Moderate thickening of the aortic valve, with moderately decreased cusp excursion. Moderate calcification of the aortic valve. Aortic valve regurgitation was not visualized by color flow Doppler. There is  Moderate-severe stenosis of the aortic  valve, with a calculated valve area of 0.85 cm.  Pulmonic Valve: The pulmonic valve was grossly normal. Pulmonic valve regurgitation is not visualized by color flow Doppler.  Aorta: The aorta is normal unless otherwise noted.  Venous: The inferior vena  cava measures 1.00 cm, is normal in size with greater than 50% respiratory variability.  Compared to previous exam: 02/12/2018, aortic valve gradients have worsened. The dimensionless obstructive index and the AV area calculation exaggerates the reduction in valve area due to erroneous location of pulsed Doppler sample volume (which  underestimates the true stroke volume). Nevertheless, the degree of aortic stenosis is approaching the severe range.    +--------------+--------++ LEFT VENTRICLE         +----------------+---------++ +--------------+--------++ Diastology                PLAX 2D                +----------------+---------++ +--------------+--------++ LV e' lateral:  6.74 cm/s LVIDd:        5.55 cm  +----------------+---------++ +--------------+--------++ LV E/e' lateral:5.2       LVIDs:        4.65 cm  +----------------+---------++ +--------------+--------++ LV e' medial:   4.90 cm/s LV PW:        1.40 cm  +----------------+---------++ +--------------+--------++ LV E/e' medial: 7.2       LV IVS:       1.50 cm  +----------------+---------++ +--------------+--------++ LVOT diam:    2.61 cm  +--------------+--------++ LV SV:        51 ml    +--------------+--------++ LV SV Index:  21.83    +--------------+--------++ LVOT Area:    5.37 cm +--------------+--------++                        +--------------+--------++  +---------------+---------++ RIGHT VENTRICLE          +---------------+---------++ RV S prime:    5.77 cm/s +---------------+---------++ TAPSE (M-mode):0.7 cm    +---------------+---------++ RVSP:          19.6 mmHg +---------------+---------++  +---------------+-------++-----------++ LEFT ATRIUM           Index       +---------------+-------++-----------++ LA diam:       4.30 cm1.96 cm/m  +---------------+-------++-----------++ LA Vol (A2C):  83.1  ml37.95 ml/m +---------------+-------++-----------++ LA Vol (A4C):  55.1 ml25.17 ml/m +---------------+-------++-----------++ LA Biplane Vol:68.9 ml31.47 ml/m +---------------+-------++-----------++ +------------+---------++-----------++ RIGHT ATRIUM         Index       +------------+---------++-----------++ RA Pressure:3.00 mmHg            +------------+---------++-----------++ RA Area:    21.90 cm            +------------+---------++-----------++ RA Volume:  66.50 ml 30.37 ml/m +------------+---------++-----------++  +------------------+------------++ AORTIC VALVE                   +------------------+------------++ AV Area (Vmax):   0.90 cm     +------------------+------------++ AV Area (Vmean):  0.90 cm     +------------------+------------++ AV Area (VTI):    0.85 cm     +------------------+------------++ AV Vmax:          368.67 cm/s  +------------------+------------++ AV Vmean:         269.000 cm/s +------------------+------------++ AV VTI:           0.843 m      +------------------+------------++ AV Peak Grad:     54.4 mmHg    +------------------+------------++ AV Mean Grad:  33.0 mmHg    +------------------+------------++ LVOT Vmax:        61.51 cm/s   +------------------+------------++ LVOT Vmean:       45.316 cm/s  +------------------+------------++ LVOT VTI:         0.133 m      +------------------+------------++ LVOT/AV VTI ratio:0.16         +------------------+------------++   +------------+-------++ AORTA               +------------+-------++ Ao Asc diam:3.60 cm +------------+-------++  +--------------+--------++   +---------------+-----------++ MITRAL VALVE             TRICUSPID VALVE            +--------------+--------++   +---------------+-----------++ MV Area (PHT):           TR Peak grad:  16.6 mmHg    +--------------+--------++   +---------------+-----------++                          TR Vmax:       208.00 cm/s +--------------+--------++   +---------------+-----------++ MV Decel Time:240 msec   Estimated RAP: 3.00 mmHg   +--------------+--------++   +---------------+-----------++ +--------------+----------++ RVSP:          19.6 mmHg   MV E velocity:35.20 cm/s +---------------+-----------++ +--------------+----------++ MV A velocity:70.40 cm/s +--------------+-------+ +--------------+----------++ SHUNTS                MV E/A ratio: 0.50       +--------------+-------+ +--------------+----------++ Systemic VTI: 0.13 m                               +--------------+-------+                              Systemic Diam:2.61 cm                              +--------------+-------+  +---------+-------+ IVC              +---------+-------+ IVC diam:1.00 cm  Recent Labs: 08/01/2018: ALT 24     Wt Readings from Last 3 Encounters:  02/07/19 243 lb 12.8 oz (110.6 kg)  01/28/19 241 lb 1.9 oz (109.4 kg)  08/01/18 245 lb (111.1 kg)     Other studies Reviewed: Additional studies/ records that were reviewed today include: Echo images, cath notes, office notes Review of the above records demonstrates: aortic stenosis   Assessment and Plan:   1. Severe Aortic Valve Stenosis: He has severe aortic valve stenosis. At this time he has slight worsening of baseline dyspnea and worsened fatigue. I have personally reviewed the echo images. The aortic valve is thickened and calcified with limited leaflet excursion. I think would benefit from AVR. Given advanced age, he is not a good candidate for conventional AVR by surgical approach. I think he may be a good candidate for TAVR.   I have reviewed the natural history of aortic stenosis with the patient and their family members  who are present today. We have discussed the limitations of medical therapy and  the poor prognosis associated with symptomatic aortic stenosis. We have reviewed potential treatment options, including palliative medical therapy, conventional surgical aortic valve replacement, and transcatheter aortic valve replacement. We discussed treatment options in the context of the patient's specific comorbid medical conditions.   He  is planning to have cataract surgery over the next month and would like to delay TAVR workup for now. I have reviewed the TAVR procedure and cath procedure in detail today. I will plan to see him back in the structural heart clinic in 2-3 months at which time we will likely proceed with arranging a cardiac cath and further TAVR workup. He will call if his symptoms worsen in the meantime.    Current medicines are reviewed at length with the patient today.  The patient does not have concerns regarding medicines.  The following changes have been made:  no change  Labs/ tests ordered today include:  No orders of the defined types were placed in this encounter.  Disposition:   FU with me in 2-3 months in the structural heart clinic.     Signed, Verne Carrow, MD 02/07/2019 10:38 AM    [facility] [address] [place], [place], [place]  [address] Phone: [phone]; Fax: [phone]

## 2019-02-07 NOTE — Patient Instructions (Signed)
Description   Continue taking same dosage 1.5 tablets every day except 1 tablet on Tuesdays and  Thursdays.  Recheck INR in 8 weeks.  Coumadin clinic #[phone].    

## 2019-02-14 NOTE — Addendum Note (Signed)
Addended by: Carylon Perches on: 02/14/2019 07:41 AM   Modules accepted: Orders

## 2019-02-19 ENCOUNTER — Telehealth (HOSPITAL_COMMUNITY): Payer: Self-pay | Admitting: Family Medicine

## 2019-02-19 ENCOUNTER — Encounter: Payer: Self-pay | Admitting: Cardiology

## 2019-02-19 NOTE — Progress Notes (Signed)
Remote pacemaker transmission.   

## 2019-03-06 ENCOUNTER — Other Ambulatory Visit: Payer: Self-pay

## 2019-03-06 MED ORDER — WARFARIN SODIUM 5 MG PO TABS: ORAL_TABLET | ORAL | 1 refills | Status: DC

## 2019-03-11 ENCOUNTER — Telehealth: Payer: Self-pay | Admitting: Pharmacist

## 2019-03-11 NOTE — Telephone Encounter (Signed)
PCP office called today to let us know that patient will be started on Doxycycline 100mg  BID x 7 days INR today is 2.6.  Will need patient to come in to clinic on Friday for check due to potential drug interaction. He already has appointment with Dr. Angelena Form that day. Will check his INR after he sees the Dr.  Patient made aware of the plan and is in agreement

## 2019-03-13 NOTE — Progress Notes (Signed)
Structural Heart Clinic Consult Note  Chief Complaint  Patient presents with   Follow-up    severe aortic stenosis   History of Present Illness: 78 yo male with history of carotid artery disease, CAD s/p 4V CABG, chronic systolic CHF, non-ischemic cardiomyopathy, HTN, HLD, borderline DM on no therapy, paroxysmal atrial fibrillation, stage 2 CKD, sleep apnea, bradycardia s/p pacemaker and severe aortic stenosis who is here today for follow up in the structural heart clinic. I saw him as a new consult to discuss TAVR on 02/07/19. He was referred by Dr. Radford Pax to discuss his aortic stenosis and possible TAVR. He has CAD and underwent 4V CABG in 2015. He has had a pacemaker implanted remotely for bradycardia and syncope. His LV systolic function was normal prior to his CABG in 2015 but in January 2016 his LVEF was noted to be  20%. Repeat cath with occlusion of the SVG to the PDA. LVEF in 2019 was 45%. His cardiomyopathy is felt to be non-ischemic. He has paroxysmal atrial fibrillation and is on coumadin. His aortic stenosis has been followed for several years. Most recent echo 01/31/19 showed LVEF=45-50%. There is severe aortic stenosis with mean gradient 33 mmHg, peak gradient 54 mmHg. The dimensionless index is 0.16 and the AVA is 0.85 but these measurements are felt to be exaggerated due to incorrect placement of the pulse doppler sample per Dr. Sallyanne Kuster who read the echo. When I met him last month he was doing well overall but did describe progressive dyspnea and fatigue. He wished to delay TAVR workup for several months due to planned cataract surgery which he completed in September 2020.   He tells me today that he continues to have dyspnea with exertion and fatigue. No chest pain. He lives in [place] with his wife. 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 He sees the dentist regularly and has no active issues. He has many questions today about the procedure.   Primary Care Physician: Leighton Ruff, MD Primary Cardiologist: Fransico Him Referring Cardiologist: Fransico Him  Past Medical History:  Diagnosis Date   AS (aortic stenosis)    moderate AS 2018   Carotid artery plaque    a. Duplex 10/2013: mild calcific plaque origin ICA. Left: intimal wall thickening CCA. 0-39% BICA.   Chronic renal insufficiency    Chronic systolic dysfunction of left ventricle    Coronary artery disease    a. s/p PCI of LAD 1997. b. Cutting balloon angioplasty to LCx in 2002. c. s/p cath 11/2013 with severe 3 vessel ASCAD s/p CABG with LIMA to LAD, SVG to diag, SVG to left circ and SVG to PDA. d. cath 07/07/2014 occluded SVG to PDA, all other grafts patent. 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  with benign gastric polypectomy 05/2014.   HLD (hyperlipidemia)    HTN (hypertension)    Hypokalemia    Hypomagnesemia    Impaired fasting glucose    A1C check every year   Obesity    OSA (obstructive sleep apnea)    Pacemaker    a. 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 b. MDT generator replacement 2012.   PAF (paroxysmal atrial fibrillation) (HCC)    on chronic systemic anticoagulation   PVC's (premature ventricular contractions)    Syncope    a. vasovagal with documented bradycardia.   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 ([place]) 12/31/1322    Past Surgical History:  Procedure Laterality Date   ANGIOPLASTY  1997   CHOLECYSTECTOMY     COLONOSCOPY WITH PROPOFOL N/A 05/05/2014   Procedure: COLONOSCOPY WITH PROPOFOL;  Surgeon: Garlan Fair, MD;  Location: [facility];  Service: Endoscopy;  Laterality: N/A;   CORONARY ARTERY BYPASS GRAFT N/A 12/01/2013   Procedure: CORONARY ARTERY BYPASS GRAFT TIMES FOUR USING LEFT INTERNAL MAMMARY ARTERY TO LAD, SAPHENOUS VEIN GRAFTS TO DIAGONAL, CIRCUMFELX AND POSTERIOR DESCENDING;  Surgeon: Ivin Poot,  MD;  Location: [facility];  Service: Open Heart Surgery;  Laterality: N/A;   ESOPHAGOGASTRODUODENOSCOPY (EGD) WITH PROPOFOL N/A 05/05/2014   Procedure: ESOPHAGOGASTRODUODENOSCOPY (EGD) WITH PROPOFOL;  Surgeon: Garlan Fair, MD;  Location: [facility];  Service: Endoscopy;  Laterality: N/A;   KNEE ARTHROSCOPY  2000   Left   LEFT AND RIGHT HEART CATHETERIZATION WITH CORONARY/GRAFT ANGIOGRAM N/A 07/07/2014   Procedure: LEFT AND RIGHT HEART CATHETERIZATION WITH Beatrix Fetters;  Surgeon: Burnell Blanks, MD;  Location: [facility] CATH LAB;  Service: Cardiovascular;  Laterality: N/A;   LEFT HEART CATHETERIZATION WITH CORONARY ANGIOGRAM N/A 11/25/2013   Procedure: LEFT HEART CATHETERIZATION WITH CORONARY ANGIOGRAM;  Surgeon: Sueanne Margarita, MD;  Location: [facility] CATH LAB;  Service: Cardiovascular;  Laterality: N/A;   PACEMAKER INSERTION  2002   generator change MDT by Dr Caryl Comes in 2012   TEE WITHOUT CARDIOVERSION N/A 08/31/2014   Procedure: TRANSESOPHAGEAL ECHOCARDIOGRAM (TEE);  Surgeon: Sueanne Margarita, MD;  Location: [facility] ENDOSCOPY;  Service: Cardiovascular;  Laterality: N/A;    Current Outpatient Medications  Medication Sig Dispense Refill   acetaminophen (TYLENOL) 500 MG tablet Take 1,000 mg by mouth every 8 (eight) hours as needed.     aspirin EC 81 MG EC tablet Take 1 tablet (81 mg total) by mouth daily.     atorvastatin (LIPITOR) 80 MG tablet TAKE 1 TABLET BY MOUTH  DAILY 90 tablet 2   carvedilol (COREG) 25 MG tablet TAKE 1 TABLET(25 MG) BY MOUTH TWICE DAILY WITH A MEAL 180 tablet 2   docusate sodium (COLACE) 100 MG capsule Take 100 mg by mouth 2 (two) times daily as needed for mild constipation.     doxycycline (VIBRA-TABS) 100 MG tablet Take 100 mg by mouth 2 (two) times daily.     fexofenadine (ALLEGRA) 180 MG tablet Take 180 mg by mouth every morning.      fluticasone (FLONASE) 50 MCG/ACT nasal spray Place 2 sprays into the nose every morning.      furosemide (LASIX) 20 MG  tablet TAKE 1 TABLET BY MOUTH TWICE DAILY. WITH 80 MG TO TOTAL 100MG 180 tablet 2   furosemide (LASIX) 80 MG tablet TAKE 1 TABLET BY MOUTH TWICE DAILY. TAKE WITH 20 MG TABLET 180 tablet 2   Glucosamine-Chondroit-Vit C-Mn (GLUCOSAMINE CHONDR 1500 COMPLX) CAPS Take 1 capsule by mouth every morning.      hydrALAZINE (APRESOLINE) 25 MG tablet TAKE 1 TABLET(25 MG) BY MOUTH THREE TIMES DAILY 270 tablet 2   isosorbide mononitrate (IMDUR) 30 MG 24 hr tablet TAKE 1 TABLET BY MOUTH EVERY DAY 90 tablet 2   L-Lysine 500 MG CAPS Take 1 capsule by mouth every morning.      meclizine (ANTIVERT) 25 MG tablet Take 25 mg by mouth daily as needed for dizziness.      Multiple Vitamin (MULTIVITAMIN) tablet Take 1 tablet by mouth every morning.      nitroGLYCERIN (NITROSTAT) 0.4 MG SL tablet Place 1  tablet (0.4 mg total) under the tongue every 5 (five) minutes as needed for chest pain. 30 tablet 3   Omega-3 Fatty Acids (FISH OIL) 1000 MG CPDR Take 2 capsules by mouth 2 (two) times daily.     ONE TOUCH ULTRA TEST test strip 1 each by Other route every morning.      ONETOUCH DELICA LANCETS FINE MISC 1 each by Other route every morning.      pantoprazole (PROTONIX) 40 MG tablet Take 40 mg by mouth every morning.      potassium chloride SA (K-DUR) 20 MEQ tablet TAKE 2 TABLETS BY MOUTH TWICE DAILY 360 tablet 2   RA NATURAL MAGNESIUM 250 MG TABS TAKE 2 TABLETS TWICE DAILY. 120 tablet 5   spironolactone (ALDACTONE) 25 MG tablet TAKE 1 TABLET(25 MG) BY MOUTH DAILY 90 tablet 2   warfarin (COUMADIN) 5 MG tablet Take 1 to 1.5 tablets daily as directed by Coumadin clinic 135 tablet 1   No current facility-administered medications for this visit.     Allergies  Allergen Reactions   Acetaminophen-Codeine Other (See Comments)    Other reaction(s): Other (See Comments) Extreme constipation Extreme constipation    Amoxicillin Itching and Rash    Unknown-maybe a rash?   Diovan [Valsartan] Other (See  Comments)    Unknown     Social History   Socioeconomic History   Marital status: Married    Spouse name: Not on file   Number of children: Not on file   Years of education: Not on file   Highest education level: Not on file  Occupational History   Not on file  Social Needs   Financial resource strain: Not on file   Food insecurity    Worry: Not on file    Inability: Not on file   Transportation needs    Medical: Not on file    Non-medical: Not on file  Tobacco Use   Smoking status: Former Smoker    Packs/day: 2.00    Years: 5.00    Pack years: 10.00    Types: Cigarettes    Quit date: 05/29/1966    Years since quitting: 52.8   Smokeless tobacco: Never Used  Substance and Sexual Activity   Alcohol use: Yes    Alcohol/week: 0.0 standard drinks    Comment: ocassional   Drug use: No   Sexual activity: Not on file  Lifestyle   Physical activity    Days per week: Not on file    Minutes per session: Not on file   Stress: Not on file  Relationships   Social connections    Talks on phone: Not on file    Gets together: Not on file    Attends religious service: Not on file    Active member of club or organization: Not on file    Attends meetings of clubs or organizations: Not on file    Relationship status: Not on file   Intimate partner violence    Fear of current or ex partner: Not on file    Emotionally abused: Not on file    Physically abused: Not on file    Forced sexual activity: Not on file  Other Topics Concern   Not on file  Social History Narrative   Lives in [place] with spouse.  Retired Hotel manager    Family History  Problem Relation Age of Onset   Colon cancer Mother        deceased   Heart attack Mother  Leukemia Father        deceased   Heart attack Brother        deceased    Review of Systems:  As stated in the HPI and otherwise negative.   BP 120/70    Pulse 74    Ht _0  (1.702 m)    Wt 146 lb (66.2 kg)    SpO2  97%    BMI 22.87 kg/m   Physical Examination: General: Well developed, well nourished, NAD  HEENT: OP clear, mucus membranes moist  SKIN: warm, dry. No rashes. Neuro: No focal deficits  Musculoskeletal: Muscle strength 5/5 all ext  Psychiatric: Mood and affect normal  Neck: No JVD, no carotid bruits, no thyromegaly, no lymphadenopathy.  Lungs:Clear bilaterally, no wheezes, rhonci, crackles Cardiovascular: Regular rate and rhythm. Loud, harsh, late peaking systolic murmur.  Abdomen:Soft. Bowel sounds present. Non-tender.  Extremities: No lower extremity edema. Pulses are 2 + in the bilateral DP/PT.  EKG:  EKG is ordered today. The ekg ordered today demonstrates NSR, RBBB  Echo 01/31/19: Echo 01/31/19: 1. The left ventricle has mildly reduced systolic function, with an ejection fraction of 45-50%. The cavity size was normal. There is moderate concentric left ventricular hypertrophy. Left ventricular diastolic Doppler parameters are consistent with  impaired relaxation. There is abnormal septal motion consistent with RV pacemaker. No evidence of left ventricular regional wall motion abnormalities. 2. The right ventricle has normal systolic function. The cavity was normal. There is no increase in right ventricular wall thickness. Right ventricular systolic pressure is normal with an estimated pressure of 26.6 mmHg. 3. Left atrial size was mildly dilated. 4. Mild thickening of the mitral valve leaflet. 5. The aortic valve is tricuspid. Moderate thickening of the aortic valve. Moderate calcification of the aortic valve. Moderate-severe stenosis of the aortic valve. 6. The aorta is normal unless otherwise noted. 7. When compared to the prior study: 02/12/2018, aortic valve gradients have worsened. The dimensionless obstructive index and the AV area calculation exaggerates the reduction in valve area due to erroneous location of pulsed Doppler sample volume  (which underestimates the true  stroke volume). Nevertheless, the degree of aortic stenosis is approaching the severe range.  FINDINGS Left Ventricle: The left ventricle has mildly reduced systolic function, with an ejection fraction of 45-50%. The cavity size was normal. There is moderate concentric left ventricular hypertrophy. Left ventricular diastolic Doppler parameters are  consistent with impaired relaxation. Normal left ventricular filling pressures There is abnormal (paradoxical) septal motion, consistent with RV pacemaker. No evidence of left ventricular regional wall motion abnormalities.. Septal-lateral dyssynchrony.  Right Ventricle: The right ventricle has normal systolic function. The cavity was normal. There is no increase in right ventricular wall thickness. Right ventricular systolic pressure is normal with an estimated pressure of 26.6 mmHg. Pacing  wire/catheter visualized in the right ventricle.  Left Atrium: Left atrial size was mildly dilated.  Right Atrium: Right atrial size was normal in size. Right atrial pressure is estimated at 10 mmHg. pacemaker lead is seen.. Pacemaker lead is seen.  Interatrial Septum: No atrial level shunt detected by color flow Doppler.  Pericardium: There is no evidence of pericardial effusion.  Mitral Valve: The mitral valve is normal in structure. Mild thickening of the mitral valve leaflet. Mitral valve regurgitation is trivial by color flow Doppler.  Tricuspid Valve: The tricuspid valve is normal in structure. Tricuspid valve regurgitation is mild by color flow Doppler.  Aortic Valve: The aortic valve is tricuspid Moderate thickening of the  aortic valve, with moderately decreased cusp excursion. Moderate calcification of the aortic valve. Aortic valve regurgitation was not visualized by color flow Doppler. There is  Moderate-severe stenosis of the aortic valve, with a calculated valve area of 0.85 cm.  Pulmonic Valve: The pulmonic valve was grossly normal.  Pulmonic valve regurgitation is not visualized by color flow Doppler.  Aorta: The aorta is normal unless otherwise noted.  Venous: The inferior vena cava measures 1.00 cm, is normal in size with greater than 50% respiratory variability.  Compared to previous exam: 02/12/2018, aortic valve gradients have worsened. The dimensionless obstructive index and the AV area calculation exaggerates the reduction in valve area due to erroneous location of pulsed Doppler sample volume (which  underestimates the true stroke volume). Nevertheless, the degree of aortic stenosis is approaching the severe range.   +--------------+--------++  LEFT VENTRICLE     +----------------+---------++ +--------------+--------++  Diastology       PLAX 2D      +----------------+---------++ +--------------+--------++  LV e' lateral:  6.74 cm/s    LVIDd:  5.55 cm    +----------------+---------++ +--------------+--------++  LV E/e' lateral: 5.2     LVIDs:  4.65 cm    +----------------+---------++ +--------------+--------++  LV e' medial:  4.90 cm/s    LV PW:  1.40 cm    +----------------+---------++ +--------------+--------++  LV E/e' medial:  7.2     LV IVS:  1.50 cm    +----------------+---------++ +--------------+--------++  LVOT diam:  2.61 cm    +--------------+--------++  LV SV:  51 ml    +--------------+--------++  LV SV Index:  21.83    +--------------+--------++  LVOT Area:  5.37 cm   +--------------+--------++        +--------------+--------++  +---------------+---------++  RIGHT VENTRICLE     +---------------+---------++  RV S prime:  5.77 cm/s   +---------------+---------++  TAPSE (M-mode): 0.7 cm    +---------------+---------++  RVSP:  19.6 mmHg   +---------------+---------++  +---------------+-------++-----------++  LEFT ATRIUM     Index     +---------------+-------++-----------++  LA diam:  4.30 cm  1.96 cm/m    +---------------+-------++-----------++  LA Vol (A2C):  83.1 ml  37.95 ml/m   +---------------+-------++-----------++  LA Vol (A4C):  55.1 ml  25.17 ml/m   +---------------+-------++-----------++  LA Biplane Vol: 68.9 ml  31.47 ml/m   +---------------+-------++-----------++ +------------+---------++-----------++  RIGHT ATRIUM    Index    +------------+---------++-----------++  RA Pressure: 3.00 mmHg      +------------+---------++-----------++  RA Area:  21.90 cm      +------------+---------++-----------++  RA Volume:  66.50 ml   30.37 ml/m   +------------+---------++-----------++ +------------------+------------++  AORTIC VALVE      +------------------+------------++  AV Area (Vmax):  0.90 cm    +------------------+------------++  AV Area (Vmean):  0.90 cm    +------------------+------------++  AV Area (VTI):  0.85 cm    +------------------+------------++  AV Vmax:  368.67 cm/s    +------------------+------------++  AV Vmean:  269.000 cm/s   +------------------+------------++  AV VTI:  0.843 m    +------------------+------------++  AV Peak Grad:  54.4 mmHg    +------------------+------------++  AV Mean Grad:  33.0 mmHg    +------------------+------------++  LVOT Vmax:  61.51 cm/s    +------------------+------------++  LVOT Vmean:  45.316 cm/s    +------------------+------------++  LVOT VTI:  0.133 m    +------------------+------------++  LVOT/AV VTI ratio: 0.16    +------------------+------------++  +------------+-------++  AORTA      +------------+-------++  Ao Asc diam: 3.60 cm   +------------+-------++  +--------------+--------++ +---------------+-----------++  MITRAL VALVE       TRICUSPID VALVE      +--------------+--------++ +---------------+-----------++  MV Area (PHT):      TR Peak grad:  16.6 mmHg    +--------------+--------++ +---------------+-----------++         TR Vmax:  208.00 cm/s   +--------------+--------++ +---------------+-----------++  MV Decel Time: 240 msec    Estimated RAP:  3.00 mmHg    +--------------+--------++ +---------------+-----------++ +--------------+----------++  RVSP:  19.6 mmHg     MV E velocity: 35.20 cm/s   +---------------+-----------++ +--------------+----------++  MV A velocity: 70.40 cm/s   +--------------+-------+ +--------------+----------++  SHUNTS      MV E/A ratio:  0.50    +--------------+-------+ +--------------+----------++  Systemic VTI:  0.13 m   +--------------+-------+  Systemic Diam: 2.61 cm  +--------------+-------+  +---------+-------+  IVC     +---------+-------+  IVC diam: 1.00 cm    Recent Labs: 08/01/2018: ALT 24    Wt Readings from Last 3 Encounters:  03/14/19 146 lb (66.2 kg)  02/07/19 243 lb 12.8 oz (110.6 kg)  01/28/19 241 lb 1.9 oz (109.4 kg)     Other studies Reviewed: Additional studies/ records that were reviewed today include: Echo images Review of the above records demonstrates: severe AS   Assessment and Plan:   1. Severe Aortic Valve Stenosis: He has severe, stage D aortic valve stenosis. I have personally reviewed the echo images. The aortic valve is thickened, calcified with limited leaflet mobility. I think he would benefit from AVR. Given advanced age, he is not a good candidate for conventional AVR by surgical approach. I think he may be a good candidate for TAVR.   STS Risk Score: Risk of Mortality: 4.824% Renal Failure: 5.343% Permanent Stroke: 1.099% Prolonged Ventilation: 19.846% DSW  Infection: 0.562% Reoperation: 4.283% Morbidity or Mortality: 22.881% Short Length of Stay: 21.467% Long Length of Stay: 11.392%   I have reviewed the natural history of aortic stenosis with the patient and their family members  who are present today. We have discussed the limitations of medical therapy and the poor prognosis associated with symptomatic aortic stenosis. We have reviewed potential treatment options, including palliative medical therapy, conventional surgical aortic valve replacement, and transcatheter aortic valve replacement. We discussed treatment options in the context of the patient's specific comorbid medical conditions.   He would like to proceed with planning for TAVR. I will arrange a right and left heart catheterization at [facility] 03/21/19 Risks and benefits of the cath procedure and the valve procedure are reviewed with the patient. After the cath, he will have a cardiac CT, CTA of the chest/abdomen and pelvis, PT assessment and will then be referred to see one of the CT surgeons on our TAVR team. He had carotid artery dopplers in January 2020 with mild disease noted.     Current medicines are reviewed at length with the patient today.  The patient does not have concerns regarding medicines.  The following changes have been made:  no change  Labs/ tests ordered today include:  No orders of the defined types were placed in this encounter.  Disposition:   FU with the valve team.   Signed, Lauree Chandler, MD 03/14/2019 9:47 AM    [address], [place], [place]  [address] Phone: [phone]; Fax: [phone]

## 2019-03-13 NOTE — H&P (View-Only) (Signed)
Structural Heart Clinic Consult Note  Chief Complaint  Patient presents with   Follow-up    severe aortic stenosis   History of Present Illness: 78 yo male with history of carotid artery disease, CAD s/p 4V CABG, chronic systolic CHF, non-ischemic cardiomyopathy, HTN, HLD, borderline DM on no therapy, paroxysmal atrial fibrillation, stage 2 CKD, sleep apnea, bradycardia s/p pacemaker and severe aortic stenosis who is here today for follow up in the structural heart clinic. I saw him as a new consult to discuss TAVR on 02/07/19. He was referred by Dr. Radford Pax to discuss his aortic stenosis and possible TAVR. He has CAD and underwent 4V CABG in 2015. He has had a pacemaker implanted remotely for bradycardia and syncope. His LV systolic function was normal prior to his CABG in 2015 but in January 2016 his LVEF was noted to be  20%. Repeat cath with occlusion of the SVG to the PDA. LVEF in 2019 was 45%. His cardiomyopathy is felt to be non-ischemic. He has paroxysmal atrial fibrillation and is on coumadin. His aortic stenosis has been followed for several years. Most recent echo 01/31/19 showed LVEF=45-50%. There is severe aortic stenosis with mean gradient 33 mmHg, peak gradient 54 mmHg. The dimensionless index is 0.16 and the AVA is 0.85 but these measurements are felt to be exaggerated due to incorrect placement of the pulse doppler sample per Dr. Sallyanne Kuster who read the echo. When I met him last month he was doing well overall but did describe progressive dyspnea and fatigue. He wished to delay TAVR workup for several months due to planned cataract surgery which he completed in September 2020.   He tells me today that he continues to have dyspnea with exertion and fatigue. No chest pain. He lives in [place] with his wife. 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 He sees the dentist regularly and has no active issues. He has many questions today about the procedure.   Primary Care Physician: Leighton Ruff, MD Primary Cardiologist: Fransico Him Referring Cardiologist: Fransico Him  Past Medical History:  Diagnosis Date   AS (aortic stenosis)    moderate AS 2018   Carotid artery plaque    a. Duplex 10/2013: mild calcific plaque origin ICA. Left: intimal wall thickening CCA. 0-39% BICA.   Chronic renal insufficiency    Chronic systolic dysfunction of left ventricle    Coronary artery disease    a. s/p PCI of LAD 1997. b. Cutting balloon angioplasty to LCx in 2002. c. s/p cath 11/2013 with severe 3 vessel ASCAD s/p CABG with LIMA to LAD, SVG to diag, SVG to left circ and SVG to PDA. d. cath 07/07/2014 occluded SVG to PDA, all other grafts patent. 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  with benign gastric polypectomy 05/2014.   HLD (hyperlipidemia)    HTN (hypertension)    Hypokalemia    Hypomagnesemia    Impaired fasting glucose    A1C check every year   Obesity    OSA (obstructive sleep apnea)    Pacemaker    a. 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 b. MDT generator replacement 2012.   PAF (paroxysmal atrial fibrillation) (HCC)    on chronic systemic anticoagulation   PVC's (premature ventricular contractions)    Syncope    a. vasovagal with documented bradycardia.   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 (HCC) 12/02/2015  ° ° °Past Surgical History:  °Procedure Laterality Date  °• ANGIOPLASTY  1997  °• CHOLECYSTECTOMY    °• COLONOSCOPY WITH PROPOFOL N/A 05/05/2014  ° Procedure: COLONOSCOPY WITH PROPOFOL;  Surgeon: Martin K Johnson, MD;  Location: [facility];  Service: Endoscopy;  Laterality: N/A;  °• CORONARY ARTERY BYPASS GRAFT N/A 12/01/2013  ° Procedure: CORONARY ARTERY BYPASS GRAFT TIMES FOUR USING LEFT INTERNAL MAMMARY ARTERY TO LAD, SAPHENOUS VEIN GRAFTS TO DIAGONAL, CIRCUMFELX AND POSTERIOR DESCENDING;  Surgeon: Peter Van Trigt,  MD;  Location: [facility] OR;  Service: Open Heart Surgery;  Laterality: N/A;  °• ESOPHAGOGASTRODUODENOSCOPY (EGD) WITH PROPOFOL N/A 05/05/2014  ° Procedure: ESOPHAGOGASTRODUODENOSCOPY (EGD) WITH PROPOFOL;  Surgeon: Martin K Johnson, MD;  Location: [facility];  Service: Endoscopy;  Laterality: N/A;  °• KNEE ARTHROSCOPY  2000  ° Left  °• LEFT AND RIGHT HEART CATHETERIZATION WITH CORONARY/GRAFT ANGIOGRAM N/A 07/07/2014  ° Procedure: LEFT AND RIGHT HEART CATHETERIZATION WITH CORONARY/GRAFT ANGIOGRAM;  Surgeon: Chrissy Ealey D Wynter Isaacs, MD;  Location: [facility];  Service: Cardiovascular;  Laterality: N/A;  °• LEFT HEART CATHETERIZATION WITH CORONARY ANGIOGRAM N/A 11/25/2013  ° Procedure: LEFT HEART CATHETERIZATION WITH CORONARY ANGIOGRAM;  Surgeon: Traci R Turner, MD;  Location: [facility];  Service: Cardiovascular;  Laterality: N/A;  °• PACEMAKER INSERTION  2002  ° generator change MDT by Dr Klein in 2012  °• TEE WITHOUT CARDIOVERSION N/A 08/31/2014  ° Procedure: TRANSESOPHAGEAL ECHOCARDIOGRAM (TEE);  Surgeon: Traci R Turner, MD;  Location: [facility];  Service: Cardiovascular;  Laterality: N/A;  ° ° °Current Outpatient Medications  °Medication Sig Dispense Refill  °• acetaminophen (TYLENOL) 500 MG tablet Take 1,000 mg by mouth every 8 (eight) hours as needed.    °• aspirin EC 81 MG EC tablet Take 1 tablet (81 mg total) by mouth daily.    °• atorvastatin (LIPITOR) 80 MG tablet TAKE 1 TABLET BY MOUTH  DAILY 90 tablet 2  °• carvedilol (COREG) 25 MG tablet TAKE 1 TABLET(25 MG) BY MOUTH TWICE DAILY WITH A MEAL 180 tablet 2  °• docusate sodium (COLACE) 100 MG capsule Take 100 mg by mouth 2 (two) times daily as needed for mild constipation.    °• doxycycline (VIBRA-TABS) 100 MG tablet Take 100 mg by mouth 2 (two) times daily.    °• fexofenadine (ALLEGRA) 180 MG tablet Take 180 mg by mouth every morning.     °• fluticasone (FLONASE) 50 MCG/ACT nasal spray Place 2 sprays into the nose every morning.     °• furosemide (LASIX) 20 MG  tablet TAKE 1 TABLET BY MOUTH TWICE DAILY. WITH 80 MG TO TOTAL 100MG 180 tablet 2  °• furosemide (LASIX) 80 MG tablet TAKE 1 TABLET BY MOUTH TWICE DAILY. TAKE WITH 20 MG TABLET 180 tablet 2  °• Glucosamine-Chondroit-Vit C-Mn (GLUCOSAMINE CHONDR 1500 COMPLX) CAPS Take 1 capsule by mouth every morning.     °• hydrALAZINE (APRESOLINE) 25 MG tablet TAKE 1 TABLET(25 MG) BY MOUTH THREE TIMES DAILY 270 tablet 2  °• isosorbide mononitrate (IMDUR) 30 MG 24 hr tablet TAKE 1 TABLET BY MOUTH EVERY DAY 90 tablet 2  °• L-Lysine 500 MG CAPS Take 1 capsule by mouth every morning.     °• meclizine (ANTIVERT) 25 MG tablet Take 25 mg by mouth daily as needed for dizziness.     °• Multiple Vitamin (MULTIVITAMIN) tablet Take 1 tablet by mouth every morning.     °• nitroGLYCERIN (NITROSTAT) 0.4 MG SL tablet Place 1   tablet (0.4 mg total) under the tongue every 5 (five) minutes as needed for chest pain. 30 tablet 3   Omega-3 Fatty Acids (FISH OIL) 1000 MG CPDR Take 2 capsules by mouth 2 (two) times daily.     ONE TOUCH ULTRA TEST test strip 1 each by Other route every morning.      ONETOUCH DELICA LANCETS FINE MISC 1 each by Other route every morning.      pantoprazole (PROTONIX) 40 MG tablet Take 40 mg by mouth every morning.      potassium chloride SA (K-DUR) 20 MEQ tablet TAKE 2 TABLETS BY MOUTH TWICE DAILY 360 tablet 2   RA NATURAL MAGNESIUM 250 MG TABS TAKE 2 TABLETS TWICE DAILY. 120 tablet 5   spironolactone (ALDACTONE) 25 MG tablet TAKE 1 TABLET(25 MG) BY MOUTH DAILY 90 tablet 2   warfarin (COUMADIN) 5 MG tablet Take 1 to 1.5 tablets daily as directed by Coumadin clinic 135 tablet 1   No current facility-administered medications for this visit.     Allergies  Allergen Reactions   Acetaminophen-Codeine Other (See Comments)    Other reaction(s): Other (See Comments) Extreme constipation Extreme constipation    Amoxicillin Itching and Rash    Unknown-maybe a rash?   Diovan [Valsartan] Other (See  Comments)    Unknown     Social History   Socioeconomic History   Marital status: Married    Spouse name: Not on file   Number of children: Not on file   Years of education: Not on file   Highest education level: Not on file  Occupational History   Not on file  Social Needs   Financial resource strain: Not on file   Food insecurity    Worry: Not on file    Inability: Not on file   Transportation needs    Medical: Not on file    Non-medical: Not on file  Tobacco Use   Smoking status: Former Smoker    Packs/day: 2.00    Years: 5.00    Pack years: 10.00    Types: Cigarettes    Quit date: 05/29/1966    Years since quitting: 52.8   Smokeless tobacco: Never Used  Substance and Sexual Activity   Alcohol use: Yes    Alcohol/week: 0.0 standard drinks    Comment: ocassional   Drug use: No   Sexual activity: Not on file  Lifestyle   Physical activity    Days per week: Not on file    Minutes per session: Not on file   Stress: Not on file  Relationships   Social connections    Talks on phone: Not on file    Gets together: Not on file    Attends religious service: Not on file    Active member of club or organization: Not on file    Attends meetings of clubs or organizations: Not on file    Relationship status: Not on file   Intimate partner violence    Fear of current or ex partner: Not on file    Emotionally abused: Not on file    Physically abused: Not on file    Forced sexual activity: Not on file  Other Topics Concern   Not on file  Social History Narrative   Lives in [place] with spouse.  Retired Hotel manager    Family History  Problem Relation Age of Onset   Colon cancer Mother        deceased   Heart attack Mother  Leukemia Father   °     deceased  °• Heart attack Brother   °     deceased  ° ° °Review of Systems:  As stated in the HPI and otherwise negative.  ° °BP 120/70    Pulse 74    Ht 5' 7" (1.702 m)    Wt 146 lb (66.2 kg)    SpO2  97%    BMI 22.87 kg/m²  ° °Physical Examination: °General: Well developed, well nourished, NAD  °HEENT: OP clear, mucus membranes moist  °SKIN: warm, dry. No rashes. °Neuro: No focal deficits  °Musculoskeletal: Muscle strength 5/5 all ext  °Psychiatric: Mood and affect normal  °Neck: No JVD, no carotid bruits, no thyromegaly, no lymphadenopathy.  °Lungs:Clear bilaterally, no wheezes, rhonci, crackles °Cardiovascular: Regular rate and rhythm. Loud, harsh, late peaking systolic murmur.  °Abdomen:Soft. Bowel sounds present. Non-tender.  °Extremities: No lower extremity edema. Pulses are 2 + in the bilateral DP/PT. ° °EKG:  EKG is ordered today. °The ekg ordered today demonstrates NSR, RBBB ° °Echo 01/31/19: °Echo 01/31/19: °1. The left ventricle has mildly reduced systolic function, with an ejection fraction of 45-50%. The cavity size was normal. There is moderate concentric left ventricular hypertrophy. Left ventricular diastolic Doppler parameters are consistent with  °impaired relaxation. There is abnormal septal motion consistent with RV pacemaker. No evidence of left ventricular regional wall motion abnormalities. ° 2. The right ventricle has normal systolic function. The cavity was normal. There is no increase in right ventricular wall thickness. Right ventricular systolic pressure is normal with an estimated pressure of 26.6 mmHg. ° 3. Left atrial size was mildly dilated. ° 4. Mild thickening of the mitral valve leaflet. ° 5. The aortic valve is tricuspid. Moderate thickening of the aortic valve. Moderate calcification of the aortic valve. Moderate-severe stenosis of the aortic valve. ° 6. The aorta is normal unless otherwise noted. ° 7. When compared to the prior study: 02/12/2018, aortic valve gradients have worsened. The dimensionless obstructive index and the AV area calculation exaggerates the reduction in valve area due to erroneous location of pulsed Doppler sample volume  °(which underestimates the true  stroke volume). Nevertheless, the degree of aortic stenosis is approaching the severe range. °  °FINDINGS ° Left Ventricle: The left ventricle has mildly reduced systolic function, with an ejection fraction of 45-50%. The cavity size was normal. There is moderate concentric left ventricular hypertrophy. Left ventricular diastolic Doppler parameters are  °consistent with impaired relaxation. Normal left ventricular filling pressures There is abnormal (paradoxical) septal motion, consistent with RV pacemaker. No evidence of left ventricular regional wall motion abnormalities.. Septal-lateral dyssynchrony. °  °Right Ventricle: The right ventricle has normal systolic function. The cavity was normal. There is no increase in right ventricular wall thickness. Right ventricular systolic pressure is normal with an estimated pressure of 26.6 mmHg. Pacing  °wire/catheter visualized in the right ventricle. °  °Left Atrium: Left atrial size was mildly dilated. °  °Right Atrium: Right atrial size was normal in size. Right atrial pressure is estimated at 10 mmHg. pacemaker lead is seen.. Pacemaker lead is seen. °  °Interatrial Septum: No atrial level shunt detected by color flow Doppler. °  °Pericardium: There is no evidence of pericardial effusion. °  °Mitral Valve: The mitral valve is normal in structure. Mild thickening of the mitral valve leaflet. Mitral valve regurgitation is trivial by color flow Doppler. °  °Tricuspid Valve: The tricuspid valve is normal in structure. Tricuspid valve regurgitation is mild by color flow Doppler. °  °Aortic Valve: The aortic valve is tricuspid Moderate thickening of the   aortic valve, with moderately decreased cusp excursion. Moderate calcification of the aortic valve. Aortic valve regurgitation was not visualized by color flow Doppler. There is  °Moderate-severe stenosis of the aortic valve, with a calculated valve area of 0.85 cm². °  °Pulmonic Valve: The pulmonic valve was grossly normal.  Pulmonic valve regurgitation is not visualized by color flow Doppler. °  °Aorta: The aorta is normal unless otherwise noted. °  °Venous: The inferior vena cava measures 1.00 cm, is normal in size with greater than 50% respiratory variability. °  °Compared to previous exam: 02/12/2018, aortic valve gradients have worsened. The dimensionless obstructive index and the AV area calculation exaggerates the reduction in valve area due to erroneous location of pulsed Doppler sample volume (which  °underestimates the true stroke volume). Nevertheless, the degree of aortic stenosis is approaching the severe range. °  °  °+--------------+--------++ ° LEFT VENTRICLE            +----------------+---------++ °+--------------+--------++  Diastology                   ° PLAX 2D                   +----------------+---------++ °+--------------+--------++  LV e' lateral:   6.74 cm/s   ° LVIDd:         5.55 cm    +----------------+---------++ °+--------------+--------++  LV E/e' lateral: 5.2         ° LVIDs:         4.65 cm    +----------------+---------++ °+--------------+--------++  LV e' medial:    4.90 cm/s   ° LV PW:         1.40 cm    +----------------+---------++ °+--------------+--------++  LV E/e' medial:  7.2         ° LV IVS:        1.50 cm    +----------------+---------++ °+--------------+--------++ ° LVOT diam:     2.61 cm    °+--------------+--------++ ° LV SV:         51 ml      °+--------------+--------++ ° LV SV Index:   21.83      °+--------------+--------++ ° LVOT Area:     5.37 cm²   °+--------------+--------++ °                           °+--------------+--------++ °  °+---------------+---------++ ° RIGHT VENTRICLE             °+---------------+---------++ ° RV S prime:     5.77 cm/s   °+---------------+---------++ ° TAPSE (M-mode): 0.7 cm      °+---------------+---------++ ° RVSP:           19.6 mmHg   °+---------------+---------++ °  °+---------------+-------++-----------++ ° LEFT ATRIUM              Index          °+---------------+-------++-----------++ ° LA diam:        4.30 cm  1.96 cm/m²    °+---------------+-------++-----------++ ° LA Vol (A2C):   83.1 ml  37.95 ml/m²   °+---------------+-------++-----------++ ° LA Vol (A4C):   55.1 ml  25.17 ml/m²   °+---------------+-------++-----------++ ° LA Biplane Vol: 68.9 ml  31.47 ml/m²   °+---------------+-------++-----------++ °+------------+---------++-----------++ ° RIGHT ATRIUM            Index         °+------------+---------++-----------++ ° RA Pressure: 3.00 mmHg                °+------------+---------++-----------++ ° RA Area:     21.90 cm²                °+------------+---------++-----------++ ° RA Volume:   66.50 ml   30.37 ml/m²   °+------------+---------++-----------++ ° +------------------+------------++ ° AORTIC VALVE                      °+------------------+------------++ ° AV Area (Vmax):    0.90 cm²       °+------------------+------------++ ° AV Area (Vmean):   0.90 cm²       °+------------------+------------++ ° AV Area (VTI):     0.85 cm²       °+------------------+------------++ ° AV Vmax:           368.67 cm/s    °+------------------+------------++ ° AV Vmean:          269.000 cm/s   °+------------------+------------++ ° AV VTI:            0.843 m        °+------------------+------------++ ° AV Peak Grad:      54.4 mmHg      °+------------------+------------++ ° AV Mean Grad:      33.0 mmHg      °+------------------+------------++ ° LVOT Vmax:         61.51 cm/s     °+------------------+------------++ ° LVOT Vmean:        45.316 cm/s    °+------------------+------------++ ° LVOT VTI:          0.133 m        °+------------------+------------++ ° LVOT/AV VTI ratio: 0.16           °+------------------+------------++ °  °+------------+-------++ ° AORTA                  °+------------+-------++ ° Ao Asc diam: 3.60 cm   °+------------+-------++ °  °+--------------+--------++   +---------------+-----------++ °   MITRAL VALVE       TRICUSPID VALVE      +--------------+--------++ +---------------+-----------++  MV Area (PHT):      TR Peak grad:  16.6 mmHg    +--------------+--------++ +---------------+-----------++         TR Vmax:  208.00 cm/s   +--------------+--------++ +---------------+-----------++  MV Decel Time: 240 msec    Estimated RAP:  3.00 mmHg    +--------------+--------++ +---------------+-----------++ +--------------+----------++  RVSP:  19.6 mmHg     MV E velocity: 35.20 cm/s   +---------------+-----------++ +--------------+----------++  MV A velocity: 70.40 cm/s   +--------------+-------+ +--------------+----------++  SHUNTS      MV E/A ratio:  0.50    +--------------+-------+ +--------------+----------++  Systemic VTI:  0.13 m   +--------------+-------+  Systemic Diam: 2.61 cm  +--------------+-------+  +---------+-------+  IVC     +---------+-------+  IVC diam: 1.00 cm    Recent Labs: 08/01/2018: ALT 24    Wt Readings from Last 3 Encounters:  03/14/19 146 lb (66.2 kg)  02/07/19 243 lb 12.8 oz (110.6 kg)  01/28/19 241 lb 1.9 oz (109.4 kg)     Other studies Reviewed: Additional studies/ records that were reviewed today include: Echo images Review of the above records demonstrates: severe AS   Assessment and Plan:   1. Severe Aortic Valve Stenosis: He has severe, stage D aortic valve stenosis. I have personally reviewed the echo images. The aortic valve is thickened, calcified with limited leaflet mobility. I think he would benefit from AVR. Given advanced age, he is not a good candidate for conventional AVR by surgical approach. I think he may be a good candidate for TAVR.   STS Risk Score: Risk of Mortality: 4.824% Renal Failure: 5.343% Permanent Stroke: 1.099% Prolonged Ventilation: 19.846% DSW  Infection: 0.562% Reoperation: 4.283% Morbidity or Mortality: 22.881% Short Length of Stay: 21.467% Long Length of Stay: 11.392%   I have reviewed the natural history of aortic stenosis with the patient and their family members  who are present today. We have discussed the limitations of medical therapy and the poor prognosis associated with symptomatic aortic stenosis. We have reviewed potential treatment options, including palliative medical therapy, conventional surgical aortic valve replacement, and transcatheter aortic valve replacement. We discussed treatment options in the context of the patient's specific comorbid medical conditions.   He would like to proceed with planning for TAVR. I will arrange a right and left heart catheterization at [facility] 03/21/19 Risks and benefits of the cath procedure and the valve procedure are reviewed with the patient. After the cath, he will have a cardiac CT, CTA of the chest/abdomen and pelvis, PT assessment and will then be referred to see one of the CT surgeons on our TAVR team. He had carotid artery dopplers in January 2020 with mild disease noted.     Current medicines are reviewed at length with the patient today.  The patient does not have concerns regarding medicines.  The following changes have been made:  no change  Labs/ tests ordered today include:  No orders of the defined types were placed in this encounter.  Disposition:   FU with the valve team.   Signed, Lauree Chandler, MD 03/14/2019 9:47 AM    [address], [place], [place]  [address] Phone: [phone]; Fax: [phone]

## 2019-03-14 ENCOUNTER — Encounter: Payer: Self-pay | Admitting: Cardiovascular Disease

## 2019-03-14 ENCOUNTER — Other Ambulatory Visit: Payer: Self-pay

## 2019-03-14 ENCOUNTER — Ambulatory Visit: Payer: Medicare Other | Admitting: Cardiovascular Disease

## 2019-03-14 ENCOUNTER — Ambulatory Visit (INDEPENDENT_AMBULATORY_CARE_PROVIDER_SITE_OTHER): Payer: Medicare Other | Admitting: *Deleted

## 2019-03-14 ENCOUNTER — Encounter: Payer: Self-pay | Admitting: *Deleted

## 2019-03-14 VITALS — BP 120/70 | HR 74 | Ht 67.0 in | Wt 146.0 lb

## 2019-03-14 DIAGNOSIS — I350 Nonrheumatic aortic (valve) stenosis: Secondary | ICD-10-CM | POA: Diagnosis not present

## 2019-03-14 DIAGNOSIS — Z01812 Encounter for preprocedural laboratory examination: Secondary | ICD-10-CM | POA: Diagnosis not present

## 2019-03-14 DIAGNOSIS — Z5181 Encounter for therapeutic drug level monitoring: Secondary | ICD-10-CM

## 2019-03-14 DIAGNOSIS — I480 Paroxysmal atrial fibrillation: Secondary | ICD-10-CM

## 2019-03-14 LAB — BASIC METABOLIC PANEL
BUN/Creatinine Ratio: 22 (ref 10–24)
BUN: 31 mg/dL — ABNORMAL HIGH (ref 8–27)
CO2: 22 mmol/L (ref 20–29)
Calcium: 9.1 mg/dL (ref 8.6–10.2)
Chloride: 103 mmol/L (ref 96–106)
Creatinine, Ser: 1.43 mg/dL — ABNORMAL HIGH (ref 0.76–1.27)
GFR calc Af Amer: 54 mL/min/{1.73_m2} — ABNORMAL LOW (ref >59)
GFR calc non Af Amer: 47 mL/min/{1.73_m2} — ABNORMAL LOW (ref >59)
Glucose: 126 mg/dL — ABNORMAL HIGH (ref 65–99)
Potassium: 4.5 mmol/L (ref 3.5–5.2)
Sodium: 139 mmol/L (ref 134–144)

## 2019-03-14 LAB — CBC
Hematocrit: 47.1 % (ref 37.5–51.0)
Hemoglobin: 16.1 g/dL (ref 13.0–17.7)
MCH: 30.6 pg (ref 26.6–33.0)
MCHC: 34.2 g/dL (ref 31.5–35.7)
MCV: 89 fL (ref 79–97)
Platelets: 157 10*3/uL (ref 150–450)
RBC: 5.27 x10E6/uL (ref 4.14–5.80)
RDW: 13.2 % (ref 11.6–15.4)
WBC: 6.3 10*3/uL (ref 3.4–10.8)

## 2019-03-14 LAB — POCT INR: INR: 2.2 (ref 2.0–3.0)

## 2019-03-14 NOTE — Addendum Note (Signed)
Addended by: Rodman Key on: 03/14/2019 05:03 PM   Modules accepted: Orders

## 2019-03-14 NOTE — Patient Instructions (Signed)
Description   Follow instructions given to you by Dr.Mcalhaney, Last dose of Coumadin10/17. Restart Coumadin the night of procedure or as directed by MD. Take 2 tablets of Coumadin on 10/23 and 10/24. Then Continue taking same dosage 1.5 tablets every day except 1 tablet on Tuesdays and  Thursdays.  Recheck INR 1 week post procedure.  Coumadin clinic [phone].

## 2019-03-14 NOTE — Patient Instructions (Addendum)
Medication Instructions:  No changes *If you need a refill on your cardiac medications before your next appointment, please call your pharmacy*  Lab Work: Today: bmet, cbc If you have labs (blood work) drawn today and your tests are completely normal, you will receive your results only by: Marland Kitchen MyChart Message (if you have MyChart) OR . A paper copy in the mail If you have any lab test that is abnormal or we need to change your treatment, we will call you to review the results.  Testing/Procedures: Your physician has requested that you have a cardiac catheterization. Cardiac catheterization is used to diagnose and/or treat various heart conditions. Doctors may recommend this procedure for a number of different reasons. The most common reason is to evaluate chest pain. Chest pain can be a symptom of coronary artery disease (CAD), and cardiac catheterization can show whether plaque is narrowing or blocking your heart's arteries. This procedure is also used to evaluate the valves, as well as measure the blood flow and oxygen levels in different parts of your heart. For further information please visit [email or web address]. Please follow instruction sheet, as given.  A Covid19 screening is required prior to the heart catheterization.  This is a drive up testing site.  Stay in your car.  Use the PRE PROCEDURE LANE.  The address is [address] After your [phone] screening you should go home and isolate there until you come to the hospital to limit potential exposure to the virus.  Follow-Up: Theodosia Quay, RN will be in contact with you regarding further testing.

## 2019-03-14 NOTE — Addendum Note (Signed)
Addended by: Rodman Key on: 03/14/2019 10:45 AM   Modules accepted: Orders

## 2019-03-17 ENCOUNTER — Telehealth: Payer: Self-pay

## 2019-03-17 DIAGNOSIS — E785 Hyperlipidemia, unspecified: Secondary | ICD-10-CM

## 2019-03-17 NOTE — Telephone Encounter (Signed)
Notes recorded by Frederik Schmidt, RN on 03/17/2019 at 1:12 PM EDT  [id number] 10/19  ------

## 2019-03-17 NOTE — Telephone Encounter (Signed)
-----   Message from Leeroy Bock, [facility] sent at 03/17/2019 12:54 PM EDT ----- LDL at goal, TG elevated at 244 above goal < 150. Would confirm pt was fasting when labs were drawn and encourage him to limit intake of sugar, carbs, and alcohol. If he is already doing these things, would recommend adding fenofibrate 54mg  daily. Continue atorvastatin 80mg  daily. He can stop OTC fish oil. Unfortunately, prescription fish oil (Vascepa) is $100/month with his insurance plan.

## 2019-03-18 NOTE — Telephone Encounter (Signed)
Patient returned call about labs. See below recommendations. Patient frustrated that [facility] lab (TG are always higher than our labs). Patient rants about changes we have made in the past and then not rechecking. This is the same rant from when we checked his lipids and called him several months ago. He wishes to get his labs rechecked with out lab before making any changes. He has an appointment on 10/30 with coumadin clinic and wishes to get labs that same day. I have moved up his coumadin clinic appointment so he can come fasting.

## 2019-03-18 NOTE — Addendum Note (Signed)
Addended by: Marcelle Overlie D on: 03/18/2019 03:44 PM   Modules accepted: Orders

## 2019-03-19 ENCOUNTER — Telehealth: Payer: Self-pay

## 2019-03-19 ENCOUNTER — Other Ambulatory Visit: Payer: Self-pay

## 2019-03-19 ENCOUNTER — Other Ambulatory Visit (HOSPITAL_COMMUNITY)
Admission: RE | Admit: 2019-03-19 | Discharge: 2019-03-19 | Disposition: A | Discharge status: Home / Self Care | Payer: Medicare Other | Source: Ambulatory Visit | Attending: Cardiovascular Disease | Admitting: Cardiovascular Disease

## 2019-03-19 DIAGNOSIS — Z01812 Encounter for preprocedural laboratory examination: Secondary | ICD-10-CM | POA: Diagnosis present

## 2019-03-19 DIAGNOSIS — Z20828 Contact with and (suspected) exposure to other viral communicable diseases: Secondary | ICD-10-CM | POA: Insufficient documentation

## 2019-03-19 DIAGNOSIS — I350 Nonrheumatic aortic (valve) stenosis: Secondary | ICD-10-CM | POA: Diagnosis not present

## 2019-03-19 LAB — SARS CORONAVIRUS 2 (TAT 6-24 HRS): SARS Coronavirus 2: NEGATIVE

## 2019-03-19 NOTE — Telephone Encounter (Signed)
-----   Message from Megan E Supple, RPH sent at 03/17/2019 12:54 PM EDT ----- LDL at goal, TG elevated at 244 above goal < 150. Would confirm pt was fasting when labs were drawn and encourage him to limit intake of sugar, carbs, and alcohol. If he is already doing these things, would recommend adding fenofibrate 54mg daily. Continue atorvastatin 80mg daily. He can stop OTC fish oil. Unfortunately, prescription fish oil (Vascepa) is $100/month with his insurance plan. 

## 2019-03-19 NOTE — Telephone Encounter (Signed)
Notes recorded by Frederik Schmidt, RN on 03/19/2019 at 9:55 AM EDT  The patient has been notified of the result and verbalized understanding. All questions (if any) were answered.  Frederik Schmidt, RN 03/19/2019 9:55 AM   ------  The patient had a conversation with our pharmacist on 10/20 and is coming in for Lipid lab draw on 10/30

## 2019-03-20 ENCOUNTER — Telehealth: Payer: Self-pay | Admitting: *Deleted

## 2019-03-20 ENCOUNTER — Other Ambulatory Visit: Payer: Self-pay

## 2019-03-20 DIAGNOSIS — I350 Nonrheumatic aortic (valve) stenosis: Secondary | ICD-10-CM

## 2019-03-20 NOTE — Telephone Encounter (Addendum)
Pt contacted pre-catheterization scheduled at [facility] for: Friday March 21, 2019 10:30 AM Verified arrival time and place: [place] [facility]) at: 8:30 AM   No solid food after midnight prior to cath, clear liquids until 5 AM day of procedure. Contrast allergy: no  Hold: Coumadin-none 03/16/19 until post procedure. Spironolactone-day before and day of procedure-GFR 47-pt already taken AM 03/20/19 Lasix/KCl-day before and day of procedure-GFR 47-pt had taken AM 03/20/19  Except hold medications AM meds can be  taken pre-cath with sip of water including: ASA 81 mg   Confirmed patient has responsible adult to drive home post procedure and observe 24 hours after arriving home: yes  Currently, due to Covid-19 pandemic, only one support person will be allowed with patient. Must be the same support person for that patient's entire stay, will be screened and required to wear a mask. They will be asked to wait in the waiting room for the duration of the patient's stay.  Patients are required to wear a mask when they enter the hospital.      COVID-19 Pre-Screening Questions:  . In the past 7 to 10 days have you had a cough,  shortness of breath, headache, congestion, fever (100 or greater) body aches, chills, sore throat, or sudden loss of taste or sense of smell? no . Have you been around anyone with known Covid 19? no . Have you been around anyone who is awaiting Covid 19 test results in the past 7 to 10 days? no . Have you been around anyone who has been exposed to Covid 19, or has mentioned symptoms of Covid 19 within the past 7 to 10 days? no  I reviewed procedure/mask/visitor instructions, Covid-19 screening instructions, pt verbalized understanding, thanked me for call.

## 2019-03-20 NOTE — Telephone Encounter (Signed)
Follow up    Patient is returning call in reference to procedure instructions. Please call.

## 2019-03-21 ENCOUNTER — Ambulatory Visit (HOSPITAL_COMMUNITY)
Admission: RE | Admit: 2019-03-21 | Discharge: 2019-03-21 | Disposition: A | Discharge status: Home / Self Care | Payer: Medicare Other | Attending: Cardiovascular Disease | Admitting: Cardiovascular Disease

## 2019-03-21 ENCOUNTER — Ambulatory Visit (HOSPITAL_COMMUNITY)
Admission: RE | Disposition: A | Discharge status: Home / Self Care | Payer: Self-pay | Source: Home / Self Care | Attending: Cardiovascular Disease

## 2019-03-21 ENCOUNTER — Other Ambulatory Visit: Payer: Self-pay

## 2019-03-21 DIAGNOSIS — I480 Paroxysmal atrial fibrillation: Secondary | ICD-10-CM | POA: Diagnosis present

## 2019-03-21 DIAGNOSIS — I428 Other cardiomyopathies: Secondary | ICD-10-CM | POA: Insufficient documentation

## 2019-03-21 DIAGNOSIS — Z79899 Other long term (current) drug therapy: Secondary | ICD-10-CM | POA: Diagnosis not present

## 2019-03-21 DIAGNOSIS — R001 Bradycardia, unspecified: Secondary | ICD-10-CM | POA: Diagnosis not present

## 2019-03-21 DIAGNOSIS — Z7901 Long term (current) use of anticoagulants: Secondary | ICD-10-CM | POA: Insufficient documentation

## 2019-03-21 DIAGNOSIS — Z87891 Personal history of nicotine dependence: Secondary | ICD-10-CM | POA: Diagnosis not present

## 2019-03-21 DIAGNOSIS — Z950 Presence of cardiac pacemaker: Secondary | ICD-10-CM | POA: Insufficient documentation

## 2019-03-21 DIAGNOSIS — N182 Chronic kidney disease, stage 2 (mild): Secondary | ICD-10-CM | POA: Diagnosis not present

## 2019-03-21 DIAGNOSIS — E119 Type 2 diabetes mellitus without complications: Secondary | ICD-10-CM

## 2019-03-21 DIAGNOSIS — Z7902 Long term (current) use of antithrombotics/antiplatelets: Secondary | ICD-10-CM | POA: Diagnosis not present

## 2019-03-21 DIAGNOSIS — Z6836 Body mass index (BMI) 36.0-36.9, adult: Secondary | ICD-10-CM | POA: Diagnosis not present

## 2019-03-21 DIAGNOSIS — I130 Hypertensive heart and chronic kidney disease with heart failure and stage 1 through stage 4 chronic kidney disease, or unspecified chronic kidney disease: Secondary | ICD-10-CM | POA: Insufficient documentation

## 2019-03-21 DIAGNOSIS — Z880 Allergy status to penicillin: Secondary | ICD-10-CM | POA: Diagnosis not present

## 2019-03-21 DIAGNOSIS — I5042 Chronic combined systolic (congestive) and diastolic (congestive) heart failure: Secondary | ICD-10-CM | POA: Diagnosis not present

## 2019-03-21 DIAGNOSIS — I359 Nonrheumatic aortic valve disorder, unspecified: Secondary | ICD-10-CM | POA: Diagnosis present

## 2019-03-21 DIAGNOSIS — K219 Gastro-esophageal reflux disease without esophagitis: Secondary | ICD-10-CM | POA: Insufficient documentation

## 2019-03-21 DIAGNOSIS — E785 Hyperlipidemia, unspecified: Secondary | ICD-10-CM | POA: Diagnosis present

## 2019-03-21 DIAGNOSIS — I350 Nonrheumatic aortic (valve) stenosis: Secondary | ICD-10-CM

## 2019-03-21 DIAGNOSIS — E669 Obesity, unspecified: Secondary | ICD-10-CM | POA: Insufficient documentation

## 2019-03-21 DIAGNOSIS — Z955 Presence of coronary angioplasty implant and graft: Secondary | ICD-10-CM | POA: Insufficient documentation

## 2019-03-21 DIAGNOSIS — Z886 Allergy status to analgesic agent status: Secondary | ICD-10-CM | POA: Insufficient documentation

## 2019-03-21 DIAGNOSIS — G4733 Obstructive sleep apnea (adult) (pediatric): Secondary | ICD-10-CM | POA: Diagnosis not present

## 2019-03-21 DIAGNOSIS — I25110 Atherosclerotic heart disease of native coronary artery with unstable angina pectoris: Secondary | ICD-10-CM | POA: Diagnosis not present

## 2019-03-21 DIAGNOSIS — Z7982 Long term (current) use of aspirin: Secondary | ICD-10-CM | POA: Insufficient documentation

## 2019-03-21 DIAGNOSIS — I352 Nonrheumatic aortic (valve) stenosis with insufficiency: Secondary | ICD-10-CM | POA: Insufficient documentation

## 2019-03-21 DIAGNOSIS — E1122 Type 2 diabetes mellitus with diabetic chronic kidney disease: Secondary | ICD-10-CM | POA: Insufficient documentation

## 2019-03-21 DIAGNOSIS — Z95820 Peripheral vascular angioplasty status with implants and grafts: Secondary | ICD-10-CM

## 2019-03-21 DIAGNOSIS — I200 Unstable angina: Secondary | ICD-10-CM

## 2019-03-21 DIAGNOSIS — R55 Syncope and collapse: Secondary | ICD-10-CM | POA: Diagnosis present

## 2019-03-21 HISTORY — PX: RIGHT/LEFT HEART CATH AND CORONARY/GRAFT ANGIOGRAPHY: CATH118267

## 2019-03-21 HISTORY — PX: CORONARY STENT INTERVENTION: CATH118234

## 2019-03-21 LAB — POCT I-STAT EG7
Acid-base deficit: 4 mmol/L — ABNORMAL HIGH (ref 0.0–2.0)
Bicarbonate: 21.4 mmol/L (ref 20.0–28.0)
Calcium, Ion: 0.99 mmol/L — ABNORMAL LOW (ref 1.15–1.40)
HCT: 39 % (ref 39.0–52.0)
Hemoglobin: 13.3 g/dL (ref 13.0–17.0)
O2 Saturation: 76 %
Potassium: 3.4 mmol/L — ABNORMAL LOW (ref 3.5–5.1)
Sodium: 144 mmol/L (ref 135–145)
TCO2: 23 mmol/L (ref 22–32)
pCO2, Ven: 38.1 mmHg — ABNORMAL LOW (ref 44.0–60.0)
pH, Ven: 7.358 (ref 7.250–7.430)
pO2, Ven: 42 mmHg (ref 32.0–45.0)

## 2019-03-21 LAB — POCT I-STAT 7, (LYTES, BLD GAS, ICA,H+H)
Acid-base deficit: 2 mmol/L (ref 0.0–2.0)
Bicarbonate: 22.8 mmol/L (ref 20.0–28.0)
Calcium, Ion: 1.22 mmol/L (ref 1.15–1.40)
HCT: 43 % (ref 39.0–52.0)
Hemoglobin: 14.6 g/dL (ref 13.0–17.0)
O2 Saturation: 100 %
Potassium: 4.1 mmol/L (ref 3.5–5.1)
Sodium: 137 mmol/L (ref 135–145)
TCO2: 24 mmol/L (ref 22–32)
pCO2 arterial: 38 mmHg (ref 32.0–48.0)
pH, Arterial: 7.387 (ref 7.350–7.450)
pO2, Arterial: 170 mmHg — ABNORMAL HIGH (ref 83.0–108.0)

## 2019-03-21 LAB — GLUCOSE, CAPILLARY: Glucose-Capillary: 142 mg/dL — ABNORMAL HIGH (ref 70–99)

## 2019-03-21 LAB — PROTIME-INR
INR: 1 (ref 0.8–1.2)
Prothrombin Time: 13.4 seconds (ref 11.4–15.2)

## 2019-03-21 LAB — POCT ACTIVATED CLOTTING TIME: Activated Clotting Time: 290 seconds

## 2019-03-21 SURGERY — RIGHT/LEFT HEART CATH AND CORONARY/GRAFT ANGIOGRAPHY
Anesthesia: LOCAL

## 2019-03-21 MED ORDER — MIDAZOLAM HCL 2 MG/2ML IJ SOLN
INTRAMUSCULAR | Status: AC
  Filled 2019-03-21: qty 2

## 2019-03-21 MED ORDER — HYDRALAZINE HCL 25 MG PO TABS: 25.0000 mg | ORAL_TABLET | Freq: Three times a day (TID) | ORAL | Status: DC

## 2019-03-21 MED ORDER — CLOPIDOGREL BISULFATE 300 MG PO TABS
ORAL_TABLET | ORAL | Status: AC
  Filled 2019-03-21: qty 1

## 2019-03-21 MED ORDER — ATORVASTATIN CALCIUM 80 MG PO TABS: 80.0000 mg | ORAL_TABLET | Freq: Every day | ORAL | Status: DC

## 2019-03-21 MED ORDER — ASPIRIN 81 MG PO CHEW: 81.0000 mg | CHEWABLE_TABLET | ORAL | Status: DC

## 2019-03-21 MED ORDER — CLOPIDOGREL BISULFATE 75 MG PO TABS: 75.0000 mg | ORAL_TABLET | Freq: Every day | ORAL | Status: DC

## 2019-03-21 MED ORDER — NITROGLYCERIN 1 MG/10 ML FOR IR/CATH LAB
INTRA_ARTERIAL | Status: AC
  Filled 2019-03-21: qty 10

## 2019-03-21 MED ORDER — DOCUSATE SODIUM 100 MG PO CAPS: 200.0000 mg | ORAL_CAPSULE | Freq: Every day | ORAL | Status: DC

## 2019-03-21 MED ORDER — ONDANSETRON HCL 4 MG/2ML IJ SOLN: 4.0000 mg | Freq: Four times a day (QID) | INTRAMUSCULAR | Status: DC | PRN

## 2019-03-21 MED ORDER — FAMOTIDINE IN NACL 20-0.9 MG/50ML-% IV SOLN
INTRAVENOUS | Status: AC
  Filled 2019-03-21: qty 50

## 2019-03-21 MED ORDER — CLOPIDOGREL BISULFATE 300 MG PO TABS
ORAL_TABLET | ORAL | Status: DC | PRN
  Administered 2019-03-21: 600 mg via ORAL

## 2019-03-21 MED ORDER — MIDAZOLAM HCL 2 MG/2ML IJ SOLN
INTRAMUSCULAR | Status: DC | PRN
  Administered 2019-03-21 (×2): 1 mg via INTRAVENOUS

## 2019-03-21 MED ORDER — SODIUM CHLORIDE 0.9 % WEIGHT BASED INFUSION
3.0000 mL/kg/h | INTRAVENOUS | Status: AC
  Administered 2019-03-21: 3 mL/kg/h via INTRAVENOUS

## 2019-03-21 MED ORDER — VERAPAMIL HCL 2.5 MG/ML IV SOLN
INTRAVENOUS | Status: AC
  Filled 2019-03-21: qty 2

## 2019-03-21 MED ORDER — SODIUM CHLORIDE 0.9 % IV SOLN: 250.0000 mL | INTRAVENOUS | Status: DC | PRN

## 2019-03-21 MED ORDER — HEPARIN SODIUM (PORCINE) 1000 UNIT/ML IJ SOLN
INTRAMUSCULAR | Status: AC
  Filled 2019-03-21: qty 1

## 2019-03-21 MED ORDER — SODIUM CHLORIDE 0.9 % IV SOLN: INTRAVENOUS | Status: DC

## 2019-03-21 MED ORDER — FUROSEMIDE 20 MG PO TABS: 80.0000 mg | ORAL_TABLET | Freq: Two times a day (BID) | ORAL | Status: DC

## 2019-03-21 MED ORDER — SODIUM CHLORIDE 0.9% FLUSH: 3.0000 mL | INTRAVENOUS | Status: DC | PRN

## 2019-03-21 MED ORDER — FLUTICASONE PROPIONATE 50 MCG/ACT NA SUSP: 2.0000 | Freq: Every day | NASAL | Status: DC

## 2019-03-21 MED ORDER — LABETALOL HCL 5 MG/ML IV SOLN: 10.0000 mg | INTRAVENOUS | Status: DC | PRN

## 2019-03-21 MED ORDER — WARFARIN SODIUM 5 MG PO TABS: 5.0000 mg | ORAL_TABLET | Freq: Every day | ORAL | Status: DC

## 2019-03-21 MED ORDER — HEPARIN (PORCINE) IN NACL 1000-0.9 UT/500ML-% IV SOLN
INTRAVENOUS | Status: AC
  Filled 2019-03-21: qty 1000

## 2019-03-21 MED ORDER — HEPARIN SODIUM (PORCINE) 1000 UNIT/ML IJ SOLN
INTRAMUSCULAR | Status: DC | PRN
  Administered 2019-03-21: 5500 [IU] via INTRAVENOUS
  Administered 2019-03-21: 6000 [IU] via INTRAVENOUS
  Administered 2019-03-21: 3000 [IU] via INTRAVENOUS

## 2019-03-21 MED ORDER — POTASSIUM CHLORIDE CRYS ER 20 MEQ PO TBCR: 40.0000 meq | EXTENDED_RELEASE_TABLET | Freq: Two times a day (BID) | ORAL | Status: DC

## 2019-03-21 MED ORDER — SPIRONOLACTONE 25 MG PO TABS: 25.0000 mg | ORAL_TABLET | Freq: Every day | ORAL | Status: DC

## 2019-03-21 MED ORDER — FAMOTIDINE IN NACL 20-0.9 MG/50ML-% IV SOLN
INTRAVENOUS | Status: AC | PRN
  Administered 2019-03-21: 20 mg via INTRAVENOUS

## 2019-03-21 MED ORDER — SODIUM CHLORIDE 0.9% FLUSH: 3.0000 mL | Freq: Two times a day (BID) | INTRAVENOUS | Status: DC

## 2019-03-21 MED ORDER — CLOPIDOGREL BISULFATE 75 MG PO TABS: 75.0000 mg | ORAL_TABLET | Freq: Every day | ORAL | 3 refills | Status: DC

## 2019-03-21 MED ORDER — FENTANYL CITRATE (PF) 100 MCG/2ML IJ SOLN
INTRAMUSCULAR | Status: AC
  Filled 2019-03-21: qty 2

## 2019-03-21 MED ORDER — CARVEDILOL 12.5 MG PO TABS: 25.0000 mg | ORAL_TABLET | Freq: Two times a day (BID) | ORAL | Status: DC

## 2019-03-21 MED ORDER — VERAPAMIL HCL 2.5 MG/ML IV SOLN
INTRAVENOUS | Status: DC | PRN
  Administered 2019-03-21: 12:00:00 10 mL via INTRA_ARTERIAL

## 2019-03-21 MED ORDER — FENTANYL CITRATE (PF) 100 MCG/2ML IJ SOLN
INTRAMUSCULAR | Status: DC | PRN
  Administered 2019-03-21 (×2): 25 ug via INTRAVENOUS

## 2019-03-21 MED ORDER — HYDRALAZINE HCL 20 MG/ML IJ SOLN: 10.0000 mg | INTRAMUSCULAR | Status: DC | PRN

## 2019-03-21 MED ORDER — LIDOCAINE HCL (PF) 1 % IJ SOLN
INTRAMUSCULAR | Status: DC | PRN
  Administered 2019-03-21 (×2): 2 mL via SUBCUTANEOUS

## 2019-03-21 MED ORDER — LIDOCAINE HCL (PF) 1 % IJ SOLN
INTRAMUSCULAR | Status: AC
  Filled 2019-03-21: qty 30

## 2019-03-21 MED ORDER — HEPARIN (PORCINE) IN NACL 1000-0.9 UT/500ML-% IV SOLN
INTRAVENOUS | Status: DC | PRN
  Administered 2019-03-21 (×2): 500 mL

## 2019-03-21 MED ORDER — NITROGLYCERIN 0.4 MG SL SUBL: 0.4000 mg | SUBLINGUAL_TABLET | SUBLINGUAL | Status: DC | PRN

## 2019-03-21 MED ORDER — SODIUM CHLORIDE 0.9 % WEIGHT BASED INFUSION: 1.0000 mL/kg/h | INTRAVENOUS | Status: DC

## 2019-03-21 MED ORDER — ASPIRIN 81 MG PO TBEC: 81.0000 mg | DELAYED_RELEASE_TABLET | Freq: Every day | ORAL | Status: DC

## 2019-03-21 MED ORDER — IOHEXOL 350 MG/ML SOLN
INTRAVENOUS | Status: DC | PRN
  Administered 2019-03-21: 13:00:00 145 mL

## 2019-03-21 MED ORDER — PANTOPRAZOLE SODIUM 40 MG PO TBEC: 40.0000 mg | DELAYED_RELEASE_TABLET | Freq: Every day | ORAL | Status: DC

## 2019-03-21 MED ORDER — ISOSORBIDE MONONITRATE ER 30 MG PO TB24: 30.0000 mg | ORAL_TABLET | Freq: Every day | ORAL | Status: DC

## 2019-03-21 MED FILL — CLOPIDOGREL 75 MG TABLET: 75 | 90 days supply | Qty: 90 | Fill #0

## 2019-03-21 SURGICAL SUPPLY — 24 items
BALLN EMERGE MR 2.0X15 (BALLOONS) ×2
BALLN SAPPHIRE ~~LOC~~ 3.0X18 (BALLOONS) ×1 IMPLANT
BALLN SAPPHIRE ~~LOC~~ 3.0X8 (BALLOONS) ×1 IMPLANT
BALLN SAPPHIRE ~~LOC~~ 3.25X12 (BALLOONS) ×1 IMPLANT
BALLOON EMERGE MR 2.0X15 (BALLOONS) IMPLANT
CATH BALLN WEDGE 5F 110CM (CATHETERS) ×1 IMPLANT
CATH DXT MULTI JL4 JR4 ANG PIG (CATHETERS) ×1 IMPLANT
CATH INFINITI 5 FR AL2 (CATHETERS) ×1 IMPLANT
CATH INFINITI 6F ANG MULTIPACK (CATHETERS) IMPLANT
CATH VISTA GUIDE 6FR JR4 (CATHETERS) ×1 IMPLANT
DEVICE RAD COMP TR BAND LRG (VASCULAR PRODUCTS) ×1 IMPLANT
GLIDESHEATH SLEND SS 6F .021 (SHEATH) ×1 IMPLANT
GUIDEWIRE .025 260CM (WIRE) ×1 IMPLANT
GUIDEWIRE INQWIRE 1.5J.035X260 (WIRE) IMPLANT
INQWIRE 1.5J .035X260CM (WIRE) ×2
KIT ENCORE 26 ADVANTAGE (KITS) ×1 IMPLANT
KIT HEART LEFT (KITS) ×2 IMPLANT
PACK CARDIAC CATHETERIZATION (CUSTOM PROCEDURE TRAY) ×2 IMPLANT
SHEATH GLIDE SLENDER 4/5FR (SHEATH) ×1 IMPLANT
STENT SYNERGY DES 2.75X32 (Permanent Stent) ×1 IMPLANT
TRANSDUCER W/STOPCOCK (MISCELLANEOUS) ×2 IMPLANT
TUBING CIL FLEX 10 FLL-RA (TUBING) ×2 IMPLANT
WIRE COUGAR XT STRL 190CM (WIRE) ×1 IMPLANT
WIRE EMERALD ST .035X150CM (WIRE) ×1 IMPLANT

## 2019-03-21 NOTE — Progress Notes (Signed)
Cardiac Rehab and Pharmacy in to teach pt.  Pt voices understanding of instructions.

## 2019-03-21 NOTE — Progress Notes (Signed)
Pt c/o dizziness and feeling faint during IV insertion.  HOB down and O2 given at 2L.   Feeling better now and tolerated next IV without difficulty.

## 2019-03-21 NOTE — Progress Notes (Signed)
1405-1500 Discussed with pt the importance of plavix with stent. Reviewed NTG use, gave heart healthy diet and attempted walking instructions. Pt stated he was waiting for TAVR before he started walking, even though I was starting him at 3 to 5 minutes twice a day.  Encouraged him to walk as tolerated and we would see after TAVR and walk with him then and give written guidelines. Discussed CRP 2 and referred to [place]. Pt had been in Maintenance since 2016. Knows I am referring to PHASE 2. He will consider. Knows he will need TAVR prior to attending. Will follow up after TAVR. Graylon Good RN BSN 03/21/2019 3:03 PM

## 2019-03-21 NOTE — Interval H&P Note (Signed)
History and Physical Interval Note:  03/21/2019 10:00 AM  Brandon Klein  has presented today for cardiac cath with the diagnosis of severe Aortic stenosis.  The various methods of treatment have been discussed with the patient and family. After consideration of risks, benefits and other options for treatment, the patient has consented to  Procedure(s): RIGHT/LEFT HEART CATH AND CORONARY/GRAFT ANGIOGRAPHY (N/A) as a surgical intervention.  The patient's history has been reviewed, patient examined, no change in status, stable for surgery.  I have reviewed the patient's chart and labs.  Questions were answered to the patient's satisfaction.    Cath Lab Visit (complete for each Cath Lab visit)  Clinical Evaluation Leading to the Procedure:   ACS: No.  Non-ACS:    Anginal Classification: CCS II  Anti-ischemic medical therapy: Maximal Therapy (2 or more classes of medications)  Non-Invasive Test Results: No non-invasive testing performed  Prior CABG: Previous CABG        Lauree Chandler

## 2019-03-21 NOTE — Discharge Summary (Signed)
Discharge Summary    Patient ID: Brandon Klein MRN: [id number]; DOB: [birth year]  Admit date: 03/21/2019 Discharge date: 03/21/2019  Primary Care Provider: Juluis RainierBarnes, Elizabeth, MD  Primary Cardiologist: Armanda Magicraci Turner, MD  Primary Electrophysiologist:  None   Discharge Diagnoses    Principal Problem:   Unstable angina [facility](HCC) Active Problems:   Bradycardia s/p MDT pacemaker   Syncope   Dyslipidemia   PAF (paroxysmal atrial fibrillation) (HCC)   Chronic anticoagulation   AS (aortic stenosis)   Obesity (BMI 30-39.9)   OSA (obstructive sleep apnea)   Chronic combined systolic and diastolic CHF (congestive heart failure) (HCC)   Nonrheumatic aortic valve disorder   Type 2 diabetes mellitus without complications (HCC)   Allergies Allergies  Allergen Reactions  . Acetaminophen-Codeine Other (See Comments)    Other reaction(s): Other (See Comments) Extreme constipation Extreme constipation   . Amoxicillin Itching and Rash    Unknown-maybe a rash?  . Diovan [Valsartan] Other (See Comments)    Unknown     Diagnostic Studies/Procedures    Right and left heart cath 03/20/19:  Mid RCA lesion is 30% stenosed.  Dist RCA-1 lesion is 90% stenosed.  Dist RCA-2 lesion is 90% stenosed.  SVG graft was not injected.  Origin to Prox Graft lesion is 100% stenosed.  Ramus lesion is 80% stenosed.  Mid Cx lesion is 100% stenosed.  SVG graft was visualized by angiography.  SVG graft was visualized by angiography.  Mid LAD lesion is 100% stenosed.  A drug-eluting stent was successfully placed using a STENT SYNERGY DES [id number].  Post intervention, there is a 40% residual stenosis.  Post intervention, there is a 0% residual stenosis.   1. 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 Known occlusion SVG to PDA. Severe stenosis distal RCA.  3. Successful PTCA/DES x 1 distal RCA.  4. Chronic occlusion mid LAD. Patent LIMA to mid LAD. Patent SVG to  Diagonal 5. Chronic occlusion mid Circumflex. Patent SVG to OM.  6. Severe disease in the small to moderate caliber intermediate branch 7. Severe aortic stenosis  By echo. Cath data with mean gradient 23.3 mmHg, peak to peak gradient 32 mm Hg).   Will continue planning for TAVR. Will continue ASA, Plavix and coumadin for now. Will continue DAPT with ASA and Plavix until after his TAVR procedure.  _____________  Echo 01/31/19:  1. The left ventricle has mildly reduced systolic function, with an ejection fraction of 45-50%. The cavity size was normal. There is moderate concentric left ventricular hypertrophy. Left ventricular diastolic Doppler parameters are consistent with  impaired relaxation. There is abnormal septal motion consistent with RV pacemaker. No evidence of left ventricular regional wall motion abnormalities.  2. The right ventricle has normal systolic function. The cavity was normal. There is no increase in right ventricular wall thickness. Right ventricular systolic pressure is normal with an estimated pressure of 26.6 mmHg.  3. Left atrial size was mildly dilated.  4. Mild thickening of the mitral valve leaflet.  5. The aortic valve is tricuspid. Moderate thickening of the aortic valve. Moderate calcification of the aortic valve. Moderate-severe stenosis of the aortic valve.  6. The aorta is normal unless otherwise noted.  7. When compared to the prior study: 02/12/2018, aortic valve gradients have worsened. The dimensionless obstructive index and the AV area calculation exaggerates the reduction in valve area due to erroneous location of pulsed Doppler sample volume  (which underestimates the true stroke volume). Nevertheless, the degree  of aortic stenosis is approaching the severe range.    History of Present Illness     Brandon Klein is a 78 y.o. male with a PMH significant for CAD status post CABG x4 in 5809, chronic systolic heart failure, nonischemic cardiomyopathy,  hypertension, hyperlipidemia, diet-controlled diabetes mellitus, paroxysmal atrial fibrillation on Coumadin, CKD stage II, OSA, bradycardia and syncope status post pacemaker, and severe aortic stenosis.  He is being evaluated by the structural heart team for TAVR.   History of coronary artery disease dating back to 1997 in which he had PCI of his LAD.  In 2002 he had [place] angioplasty to the left circumflex followed by CABG x4 in 2015 (LIMA-LAD, SVG-diag, SVG-Cx and SVG-PDA). He had a heart catheterization in January 2016 with occlusion of his SVG-PDA, all other grafts patent.  EF at that time was estimated at 20%.  Reduction in EF was thought to be nonischemic.  Most recent echo 01/31/2019 with an EF of 45 to 50%.  He does have severe aortic stenosis with a mean gradient of 33 mmHg, and peak gradient 54mmHg, and a dimensionless index of 0.16.  There is some question regarding these measurements and placement of the Doppler.  He was last seen by Dr. Angelena Form on 03/14/2019 and reported continued dyspnea on exertion and fatigue.  He denied anginal symptoms.  Plan for TAVR work-up to include right and left heart catheterization.  Hospital Course     Consultants: none  Severe aortic stenosis History of coronary artery disease status post CABG x4 (2015) Patient presented for right and left heart catheterization on 03/21/2019. Angiography revealed continued patency of 3/4 grafts with known occlusion of the SVG-PDA. Distal RCA found to have severe stenosis treated with DES. He tolerated the procedure well. Right AC and left radial sites C/D/I, no hematomas. He will discharge on triple therapy ASA, plavix, and coumadin - these will be adjusted after his TAVR procedure. Aortic valve mean gradient 23.3 mHg with peak to peak gradient 32 mmHg. Continue  Continue statin. Already on PPI at home.    PAF Continue coumadin for stroke PPX for a CHA2DS2-VASc Score and unadjusted Ischemic Stroke Rate (% per year)  equal to 9.7 % stroke rate/year from a score of 6 (age, CHF, HTN, DM, MI). Continue BB.    Essential Hypertension Continue coreg, hydralazine, imdur, and spironolaconte.    Chronic combined systolic and diastolic heart failure Most recent echo with EF of 45-50%. Continue home diuretic regimen, BB, imdur, hydralazine, and spiro.    Pt seen and examined, deemed stable for discharge.    Did the patient have an acute coronary syndrome (MI, NSTEMI, STEMI, etc) this admission?:  No                               Did the patient have a percutaneous coronary intervention (stent / angioplasty)?:  Yes.     Cath/PCI Registry Performance & Quality Measures: 1. Aspirin prescribed? - Yes 2. ADP Receptor Inhibitor (Plavix/Clopidogrel, Brilinta/Ticagrelor or Effient/Prasugrel) prescribed (includes medically managed patients)? - Yes 3. High Intensity Statin (Lipitor 40-80mg  or Crestor 20-40mg ) prescribed? - Yes 4. For EF <40%, was ACEI/ARB prescribed? - Yes 5. For EF <40%, Aldosterone Antagonist (Spironolactone or Eplerenone) prescribed? - Yes 6. Cardiac Rehab Phase II ordered (Included Medically managed Patients)? - Yes   _____________  Discharge Vitals Blood pressure 114/76, pulse 61, temperature 98 F (36.7 C), temperature source Oral, resp. rate  16, height 5\' 7"  (1.702 m), weight 106.6 kg, SpO2 97 %.  Filed Weights   03/21/19 0859  Weight: 106.6 kg    Labs & Radiologic Studies    CBC Recent Labs    03/21/19 1136 03/21/19 1140  HGB 13.3 14.6  HCT 39.0 43.0   Basic Metabolic Panel Recent Labs    03/23/19 1136 03/21/19 1140  NA 144 137  K 3.4* 4.1   Liver Function Tests No results for input(s): AST, ALT, ALKPHOS, BILITOT, PROT, ALBUMIN in the last 72 hours. No results for input(s): LIPASE, AMYLASE in the last 72 hours. High Sensitivity Troponin:   No results for input(s): TROPONINIHS in the last 720 hours.  BNP Invalid input(s): POCBNP D-Dimer No results for input(s):  DDIMER in the last 72 hours. Hemoglobin A1C No results for input(s): HGBA1C in the last 72 hours. Fasting Lipid Panel No results for input(s): CHOL, HDL, LDLCALC, TRIG, CHOLHDL, LDLDIRECT in the last 72 hours. Thyroid Function Tests No results for input(s): TSH, T4TOTAL, T3FREE, THYROIDAB in the last 72 hours.  Invalid input(s): FREET3 _____________  No results found. Disposition   Pt is being discharged home today in good condition.  Follow-up Plans & Appointments    Follow-up Information    03/23/19, MD Follow up in 1 week(s).   Specialty: Cardiology Contact information: [address] [place] [phone]          Discharge Instructions    Diet - low sodium heart healthy   Complete by: As directed    Discharge instructions   Complete by: As directed    No driving for 1 week. No lifting over 5 lbs for 1 week. No sexual activity for 1 week. Keep procedure site clean & dry. If you notice increased pain, swelling, bleeding or pus, call/return!  You may shower, but no soaking baths/hot tubs/pools for 1 week.   Increase activity slowly   Complete by: As directed       Discharge Medications   Allergies as of 03/21/2019      Reactions   Acetaminophen-codeine Other (See Comments)   Other reaction(s): Other (See Comments) Extreme constipation Extreme constipation   Amoxicillin Itching, Rash   Unknown-maybe a rash?   Diovan [valsartan] Other (See Comments)   Unknown       Medication List    TAKE these medications   acetaminophen 500 MG tablet Commonly known as: TYLENOL Take 1,000 mg by mouth 3 (three) times daily.   Antivert 25 MG tablet Generic drug: meclizine Take 25 mg by mouth daily as needed for dizziness.   aspirin 81 MG EC tablet Take 1 tablet (81 mg total) by mouth daily.   atorvastatin 80 MG tablet Commonly known as: LIPITOR TAKE 1 TABLET BY MOUTH  DAILY What changed: when to take this   carvedilol 25  MG tablet Commonly known as: COREG TAKE 1 TABLET(25 MG) BY MOUTH TWICE DAILY WITH A MEAL What changed: See the new instructions.   clopidogrel 75 MG tablet Commonly known as: PLAVIX Take 1 tablet (75 mg total) by mouth daily with breakfast. Start taking on: March 22, 2019   docusate sodium 100 MG capsule Commonly known as: COLACE Take 200 mg by mouth daily.   fexofenadine 180 MG tablet Commonly known as: ALLEGRA Take 180 mg by mouth daily.   Fish Oil 1000 MG Cpdr Take 2,000 mg by mouth 2 (two) times daily.   Flonase 50 MCG/ACT nasal spray Generic drug:  fluticasone Place 2 sprays into the nose daily.   furosemide 80 MG tablet Commonly known as: LASIX TAKE 1 TABLET BY MOUTH TWICE DAILY. TAKE WITH 20 MG TABLET What changed:   how much to take  how to take this  when to take this  additional instructions   furosemide 20 MG tablet Commonly known as: LASIX TAKE 1 TABLET BY MOUTH TWICE DAILY. WITH 80 MG TO TOTAL 100MG  What changed:   how much to take  how to take this  when to take this  additional instructions   Glucosamine Chondr 1500 Complx Caps Take 1 capsule by mouth every morning.   hydrALAZINE 25 MG tablet Commonly known as: APRESOLINE TAKE 1 TABLET(25 MG) BY MOUTH THREE TIMES DAILY What changed: See the new instructions.   isosorbide mononitrate 30 MG 24 hr tablet Commonly known as: IMDUR TAKE 1 TABLET BY MOUTH EVERY DAY   L-Lysine 1000 MG Tabs Take 1,000 mg by mouth daily.   multivitamin tablet Take 1 tablet by mouth daily.   nitroGLYCERIN 0.4 MG SL tablet Commonly known as: NITROSTAT Place 1 tablet (0.4 mg total) under the tongue every 5 (five) minutes as needed for chest pain.   ONE TOUCH ULTRA TEST test strip Generic drug: glucose blood 1 each by Other route every morning.   OneTouch Delica Lancets Fine Misc 1 each by Other route every morning.   pantoprazole 40 MG tablet Commonly known as: PROTONIX Take 40 mg by mouth daily.    potassium chloride SA 20 MEQ tablet Commonly known as: KLOR-CON TAKE 2 TABLETS BY MOUTH TWICE DAILY   PRESCRIPTION MEDICATION Place 1 drop into both eyes daily. Compounded eye drop  bromfenac ophthalmic solution 0.75% Gatifloxacin prednisolone acetate 1 % eye drops,suspension   RA Natural Magnesium 250 MG Tabs Generic drug: Magnesium TAKE 2 TABLETS TWICE DAILY. What changed: how much to take   spironolactone 25 MG tablet Commonly known as: ALDACTONE TAKE 1 TABLET(25 MG) BY MOUTH DAILY What changed: See the new instructions.   warfarin 5 MG tablet Commonly known as: COUMADIN Take as directed. If you are unsure how to take this medication, talk to your nurse or doctor. Original instructions: Take 1 to 1.5 tablets daily as directed by Coumadin clinic What changed:   how much to take  how to take this  when to take this  additional instructions          Outstanding Labs/Studies   none  Duration of Discharge Encounter   Greater than 30 minutes including physician time.  Signed, Roe Rutherford Mamoru Takeshita, PA 03/21/2019, 2:43 PM

## 2019-03-21 NOTE — Discharge Instructions (Signed)
Radial Site Care ° °This sheet gives you information about how to care for yourself after your procedure. Your health care provider may also give you more specific instructions. If you have problems or questions, contact your health care provider. °What can I expect after the procedure? °After the procedure, it is common to have: °· Bruising and tenderness at the catheter insertion area. °Follow these instructions at home: °Medicines °· Take over-the-counter and prescription medicines only as told by your health care provider. °Insertion site care °· Follow instructions from your health care provider about how to take care of your insertion site. Make sure you: °? Wash your hands with soap and water before you change your bandage (dressing). If soap and water are not available, use hand sanitizer. °? Change your dressing as told by your health care provider. °? Leave stitches (sutures), skin glue, or adhesive strips in place. These skin closures may need to stay in place for 2 weeks or longer. If adhesive strip edges start to loosen and curl up, you may trim the loose edges. Do not remove adhesive strips completely unless your health care provider tells you to do that. °· Check your insertion site every day for signs of infection. Check for: °? Redness, swelling, or pain. °? Fluid or blood. °? Pus or a bad smell. °? Warmth. °· Do not take baths, swim, or use a hot tub until your health care provider approves. °· You may shower 24-48 hours after the procedure, or as directed by your health care provider. °? Remove the dressing and gently wash the site with plain soap and water. °? Pat the area dry with a clean towel. °? Do not rub the site. That could cause bleeding. °· Do not apply powder or lotion to the site. °Activity ° °· For 24 hours after the procedure, or as directed by your health care provider: °? Do not flex or bend the affected arm. °? Do not push or pull heavy objects with the affected arm. °? Do not  drive yourself home from the hospital or clinic. You may drive 24 hours after the procedure unless your health care provider tells you not to. °? Do not operate machinery or power tools. °· Do not lift anything that is heavier than 10 lb (4.5 kg), or the limit that you are told, until your health care provider says that it is safe. °· Ask your health care provider when it is okay to: °? Return to work or school. °? Resume usual physical activities or sports. °? Resume sexual activity. °General instructions °· If the catheter site starts to bleed, raise your arm and put firm pressure on the site. If the bleeding does not stop, get help right away. This is a medical emergency. °· If you went home on the same day as your procedure, a responsible adult should be with you for the first 24 hours after you arrive home. °· Keep all follow-up visits as told by your health care provider. This is important. °Contact a health care provider if: °· You have a fever. °· You have redness, swelling, or yellow drainage around your insertion site. °Get help right away if: °· You have unusual pain at the radial site. °· The catheter insertion area swells very fast. °· The insertion area is bleeding, and the bleeding does not stop when you hold steady pressure on the area. °· Your arm or hand becomes pale, cool, tingly, or numb. °These symptoms may represent a serious problem   that is an emergency. Do not wait to see if the symptoms will go away. Get medical help right away. Call your local emergency services (911 in the [place] Do not drive yourself to the hospital. Summary  After the procedure, it is common to have bruising and tenderness at the site.  Follow instructions from your health care provider about how to take care of your radial site wound. Check the wound every day for signs of infection.  Do not lift anything that is heavier than 10 lb (4.5 kg), or the limit that you are told, until your health care provider says  that it is safe. This information is not intended to replace advice given to you by your health care provider. Make sure you discuss any questions you have with your health care provider. Document Released: 06/17/2010 Document Revised: 06/20/2017 Document Reviewed: 06/20/2017 Elsevier Patient Education  2020 [place] about your medication: Plavix (anti-platelet agent)  Generic Name (Brand): clopidogrel (Plavix), once daily medication  PURPOSE: You are taking this medication along with aspirin to lower your chance of having a heart attack, stroke, or blood clots in your heart stent. These can be fatal. Brilinta and aspirin help prevent platelets from sticking together and forming a clot that can block an artery or your stent.   Common SIDE EFFECTS you may experience include: bruising or bleeding more easily, shortness of breath  Do not stop taking PLAVIX without talking to the doctor who prescribes it for you. People who are treated with a stent and stop taking Plavix too soon, have a higher risk of getting a blood clot in the stent, having a heart attack, or dying. If you stop Plavix because of bleeding, or for other reasons, your risk of a heart attack or stroke may increase.   Tell all of your doctors and dentists that you are taking Plavix. They should talk to the doctor who prescribed Brilinta for you before you have any surgery or invasive procedure.   Contact your health care provider if you experience: severe or uncontrollable bleeding, pink/red/brown urine, vomiting blood or vomit that looks like "coffee grounds", red or black stools (looks like tar), coughing up blood or blood clots ----------------------------------------------------------------------------------------------------------------------

## 2019-03-24 ENCOUNTER — Telehealth: Payer: Self-pay

## 2019-03-24 ENCOUNTER — Encounter (HOSPITAL_COMMUNITY): Payer: Self-pay | Admitting: Cardiovascular Disease

## 2019-03-24 MED FILL — Nitroglycerin IV Soln 100 MCG/ML in D5W: INTRA_ARTERIAL | Qty: 10 | Status: AC

## 2019-03-24 NOTE — Telephone Encounter (Signed)
-----   Message from Ledora Bottcher, [place] sent at 03/21/2019  2:28 PM EDT ----- Pt is discharging after heart cath for TAVR workup, underwent PCI.   Please schedule TOC follow up with and APP on Dr. Theodosia Blender team.    Joellen Jersey, pt doesn't have a follow up with TAVR team yet, but I'm sure he's on your radar.   Thanks Angie

## 2019-03-25 ENCOUNTER — Telehealth (HOSPITAL_COMMUNITY): Payer: Self-pay

## 2019-03-25 NOTE — Telephone Encounter (Signed)
Will close this referral, pt is schedule to have a TAVR done. Will wait for new referral from Ph.1.  Closed referral

## 2019-03-25 NOTE — Telephone Encounter (Signed)
Patient contacted regarding discharge from [facility] on 03/21/2019.  Patient understands to follow up with our Coumadin Clinic for INR check on 10/30 @ 12:00 and he is aware of BMET and Lipids scheduled to be drawn at [facility] lab located on [facility] in [place] (Dr Landis Gandy office). Also, he is scheduled to see Dr Ricard Dillon (TCTS) on 03/31/2019 at 12:30. Pt may not require a f/u with cardiology at this point.  Patient understands discharge instructions? Yes Patient understands medications and regiment? Yes Patient understands to bring all medications to this visit? Yes

## 2019-03-26 ENCOUNTER — Other Ambulatory Visit: Payer: Self-pay

## 2019-03-26 ENCOUNTER — Other Ambulatory Visit: Payer: Medicare Other | Admitting: *Deleted

## 2019-03-26 DIAGNOSIS — I350 Nonrheumatic aortic (valve) stenosis: Secondary | ICD-10-CM

## 2019-03-26 DIAGNOSIS — E785 Hyperlipidemia, unspecified: Secondary | ICD-10-CM

## 2019-03-26 LAB — LIPID PANEL
Chol/HDL Ratio: 2.9 ratio (ref 0.0–5.0)
Cholesterol, Total: 112 mg/dL (ref 100–199)
HDL: 39 mg/dL — ABNORMAL LOW (ref >39)
LDL Chol Calc (NIH): 36 mg/dL (ref 0–99)
Triglycerides: 238 mg/dL — ABNORMAL HIGH (ref 0–149)
VLDL Cholesterol Cal: 37 mg/dL (ref 5–40)

## 2019-03-26 LAB — BASIC METABOLIC PANEL
BUN/Creatinine Ratio: 20 (ref 10–24)
BUN: 24 mg/dL (ref 8–27)
CO2: 22 mmol/L (ref 20–29)
Calcium: 9.2 mg/dL (ref 8.6–10.2)
Chloride: 100 mmol/L (ref 96–106)
Creatinine, Ser: 1.22 mg/dL (ref 0.76–1.27)
GFR calc Af Amer: 65 mL/min/{1.73_m2} (ref >59)
GFR calc non Af Amer: 56 mL/min/{1.73_m2} — ABNORMAL LOW (ref >59)
Glucose: 138 mg/dL — ABNORMAL HIGH (ref 65–99)
Potassium: 4.6 mmol/L (ref 3.5–5.2)
Sodium: 137 mmol/L (ref 134–144)

## 2019-03-27 ENCOUNTER — Telehealth: Payer: Self-pay | Admitting: Cardiovascular Disease

## 2019-03-27 ENCOUNTER — Ambulatory Visit (HOSPITAL_COMMUNITY)
Admission: RE | Admit: 2019-03-27 | Discharge: 2019-03-27 | Disposition: A | Discharge status: Home / Self Care | Payer: Medicare Other | Source: Ambulatory Visit | Attending: Cardiovascular Disease | Admitting: Cardiovascular Disease

## 2019-03-27 ENCOUNTER — Encounter: Payer: Medicare Other | Admitting: Surgery

## 2019-03-27 DIAGNOSIS — I350 Nonrheumatic aortic (valve) stenosis: Secondary | ICD-10-CM

## 2019-03-27 DIAGNOSIS — E785 Hyperlipidemia, unspecified: Secondary | ICD-10-CM

## 2019-03-27 MED ORDER — IOHEXOL 350 MG/ML SOLN
100.0000 mL | Freq: Once | INTRAVENOUS | Status: AC | PRN
  Administered 2019-03-27: 100 mL via INTRAVENOUS

## 2019-03-27 NOTE — Telephone Encounter (Signed)
Patient returning call for test results

## 2019-03-27 NOTE — Telephone Encounter (Signed)
Pt has been notified lipid panel results by phone with verbal understanding. Pt has been advised of recommendations for referral to Lipid Clinic. Pt states each time he has lab work done with PCP which he states this lab work was done with PCP that his numbers are always higher than when we check his lab work. Pt states he s/w Melissa, Pharm-D in regards to cholesterol. Pt is agreeable to Lipid Clinic. Pt states he has appt tomorrow for his INR check. He states he would like to have the Lipid Clinic appt with the Pharm-D tomorrow as well. I assured the pt I will let the Pharm-D know of our conversation today. Pt thanked me for the call. Patient notified of result.  Please refer to phone note from today for complete details.   Julaine Hua, [facility] 03/27/2019 2:49 PM

## 2019-03-28 ENCOUNTER — Other Ambulatory Visit: Payer: Medicare Other

## 2019-03-28 ENCOUNTER — Ambulatory Visit (INDEPENDENT_AMBULATORY_CARE_PROVIDER_SITE_OTHER): Payer: Medicare Other | Admitting: Pharmacist

## 2019-03-28 ENCOUNTER — Other Ambulatory Visit: Payer: Self-pay

## 2019-03-28 DIAGNOSIS — E785 Hyperlipidemia, unspecified: Secondary | ICD-10-CM

## 2019-03-28 DIAGNOSIS — I480 Paroxysmal atrial fibrillation: Secondary | ICD-10-CM | POA: Diagnosis not present

## 2019-03-28 DIAGNOSIS — Z5181 Encounter for therapeutic drug level monitoring: Secondary | ICD-10-CM

## 2019-03-28 LAB — POCT INR: INR: 1.8 — AB (ref 2.0–3.0)

## 2019-03-28 MED ORDER — VASCEPA 1 G PO CAPS: 2.0000 | ORAL_CAPSULE | Freq: Two times a day (BID) | ORAL | 4 refills | Status: DC

## 2019-03-28 NOTE — Patient Instructions (Addendum)
It was a pleasure seeing you in clinic today Brandon Klein!  Today the plan is... 1. Continue atorvastatin 80 mg daily 2. START taking Vascepa 2 grams twice daily 3. Come back for fasting labs on 07/06/18 at 11:00 AM to check your cholesterol and liver   Please call the PharmD clinic at [phone] if you have any questions that you would like to speak with a pharmacist about [facility], [place], [place]).

## 2019-03-28 NOTE — Progress Notes (Signed)
Patient ID: [name]                 DOB: September 12, 1940                    MRN: [id number]     HPI: Brandon Klein is a 78 y.o. male patient referred to lipid clinic by Dr. Radford Pax. PMH is significant for Afib, syncope, aortic stenosis, carotid atherosclerosis, chronic combined systolic and diastolic CHF, nonrheumatic aortic valve disorder, HTN, CAD, unstable angina, OSA, GERD, GI bleed, T2DM, OA, DJD, bradycardia, dyslipidemia, anemia, s/p pacemaker (2002).  Pt presents today for initial appt with lipid clinic. Pt states he always fasts before labs at [facility] and he fasted before lab draw here on 03/26/19. Pt states his most recent HbA1c reading was 6.5 (drawn at [facility]). Pt is currently tolerating statin therapy.   Current Medications: atorvastatin 80 mg daily Intolerances: Zetia 10 mg daily ("pt was unaware he has ever taken it"), Vascepa 2 grams twice daily (never started; cost), Lovaza 2 grams twice daily (never started; cost), OTC lovaza   Risk Factors: significant cardiac history (CAD (carotid atherosclerosis, unstable angina), s/p pacemaker, nonrheumatic aortic valve disorder, CHF, Afib), T2DM, age, obesity, lack of exercise, family history  LDL goal: <55 mg/dL  Diet: 2 meals per day -Breakfast: greek yogurt, banana, orange juice -Dinner: chicken, fish, pasta, steak, hamburger, stills eats "some" - brussel sprouts/corn/peas/green beans/broccoli  -Snacks: cheese/crackers, chips/salsa, chip and dip -Drinks: coke zero, water  -Fast food:1x per month (whoppers)   Exercise: none in the 8 months; used to do 3 days per week at cardiac rehab   Family History: mother (colon cancer, MI); brother (MI); father (leukemia)  Social History: former smoker (1968; 3 ppd; 4-5 years), seldomly drinks (maybe 8 drinks in 8 months)    Labs: 03/26/19 TC 112 TG 238 HDL 39 VLDL 37 LDL 36; atorvastatin 80 mg daily 08/01/18 TC 116 TG 128  HDL 44 VLDL 26 LDL 46; atorvastatin 80 mg daily 08/07/17 TC 117 TG 183 HDL  39 VLDL 37 LDL 41; atorvastatin 80 mg daily   Past Medical History:  Diagnosis Date  . AS (aortic stenosis)    moderate AS 2018  . Carotid artery plaque    a. Duplex 10/2013: mild calcific plaque origin ICA. Left: intimal wall thickening CCA. 0-39% BICA.  Marland Kitchen Chronic renal insufficiency   . Chronic systolic dysfunction of left ventricle   . Coronary artery disease    a. s/p PCI of LAD 1997. b. Cutting balloon angioplasty to LCx in 2002. c. s/p cath 11/2013 with severe 3 vessel ASCAD s/p CABG with LIMA to LAD, SVG to diag, SVG to left circ and SVG to PDA. d. cath 07/07/2014 occluded SVG to PDA, all other grafts patent. 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 DJD (degenerative joint disease)   . Epistaxis   . GERD (gastroesophageal reflux disease)   . GI bleeding    a.  with benign gastric polypectomy 05/2014.  Marland Kitchen HLD (hyperlipidemia)   . HTN (hypertension)   . Hypokalemia   . Hypomagnesemia   . Impaired fasting glucose    A1C check every year  . Obesity   . OSA (obstructive sleep apnea)   . Pacemaker    a. 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 b. MDT generator replacement 2012.  Marland Kitchen PAF (paroxysmal atrial fibrillation) (HCC)    on chronic systemic anticoagulation  . PVC's (premature ventricular contractions)   . Syncope  a. vasovagal with documented bradycardia.  . Type 2 diabetes mellitus without complications (HCC) 12/02/2015    Current Outpatient Medications on File Prior to Visit  Medication Sig Dispense Refill  . acetaminophen (TYLENOL) 500 MG tablet Take 1,000 mg by mouth 3 (three) times daily.     Marland Kitchen aspirin EC 81 MG EC tablet Take 1 tablet (81 mg total) by mouth daily.    Marland Kitchen atorvastatin (LIPITOR) 80 MG tablet TAKE 1 TABLET BY MOUTH  DAILY (Patient taking differently: Take 80 mg by mouth daily at 6 PM. ) 90 tablet 2  . carvedilol (COREG) 25 MG tablet TAKE 1 TABLET(25 MG) BY MOUTH TWICE DAILY WITH A MEAL (Patient taking differently: Take 25 mg by mouth 2 (two) times  daily with a meal. ) 180 tablet 2  . clopidogrel (PLAVIX) 75 MG tablet Take 1 tablet (75 mg total) by mouth daily with breakfast. 90 tablet 3  . docusate sodium (COLACE) 100 MG capsule Take 200 mg by mouth daily.     . fexofenadine (ALLEGRA) 180 MG tablet Take 180 mg by mouth daily.     . fluticasone (FLONASE) 50 MCG/ACT nasal spray Place 2 sprays into the nose daily.     . furosemide (LASIX) 20 MG tablet TAKE 1 TABLET BY MOUTH TWICE DAILY. WITH 80 MG TO TOTAL 100MG  (Patient taking differently: Take 20 mg by mouth 2 (two) times daily. TAKE 20 mg TABLET BY MOUTH TWICE DAILY. WITH 80 MG TO TOTAL 100MG ) 180 tablet 2  . furosemide (LASIX) 80 MG tablet TAKE 1 TABLET BY MOUTH TWICE DAILY. TAKE WITH 20 MG TABLET (Patient taking differently: Take 80 mg by mouth 2 (two) times daily. TAKE 80 mg TABLET BY MOUTH TWICE DAILY. TAKE WITH 20 MG table for a total of 100 mg) 180 tablet 2  . Glucosamine-Chondroit-Vit C-Mn (GLUCOSAMINE CHONDR 1500 COMPLX) CAPS Take 1 capsule by mouth every morning.     . hydrALAZINE (APRESOLINE) 25 MG tablet TAKE 1 TABLET(25 MG) BY MOUTH THREE TIMES DAILY (Patient taking differently: Take 25 mg by mouth 3 (three) times daily. ) 270 tablet 2  . isosorbide mononitrate (IMDUR) 30 MG 24 hr tablet TAKE 1 TABLET BY MOUTH EVERY DAY (Patient taking differently: Take 30 mg by mouth daily. ) 90 tablet 2  . L-Lysine 1000 MG TABS Take 1,000 mg by mouth daily.     . meclizine (ANTIVERT) 25 MG tablet Take 25 mg by mouth daily as needed for dizziness.     . Multiple Vitamin (MULTIVITAMIN) tablet Take 1 tablet by mouth daily.     . nitroGLYCERIN (NITROSTAT) 0.4 MG SL tablet Place 1 tablet (0.4 mg total) under the tongue every 5 (five) minutes as needed for chest pain. 30 tablet 3  . Omega-3 Fatty Acids (FISH OIL) 1000 MG CPDR Take 2,000 mg by mouth 2 (two) times daily.     . ONE TOUCH ULTRA TEST test strip 1 each by Other route every morning.     DELICA LANCETS FINE MISC 1 each by Other  route every morning.     . pantoprazole (PROTONIX) 40 MG tablet Take 40 mg by mouth daily.     . potassium chloride SA (K-DUR) 20 MEQ tablet TAKE 2 TABLETS BY MOUTH TWICE DAILY (Patient taking differently: Take 40 mEq by mouth 2 (two) times daily. ) 360 tablet 2  . PRESCRIPTION MEDICATION Place 1 drop into both eyes daily. Compounded eye drop  bromfenac ophthalmic solution 0.75% Gatifloxacin prednisolone acetate 1 %  eye drops,suspension    . RA NATURAL MAGNESIUM 250 MG TABS TAKE 2 TABLETS TWICE DAILY. (Patient taking differently: Take 500 mg by mouth 2 (two) times daily. ) 120 tablet 5  . spironolactone (ALDACTONE) 25 MG tablet TAKE 1 TABLET(25 MG) BY MOUTH DAILY (Patient taking differently: Take 25 mg by mouth daily. ) 90 tablet 2  . warfarin (COUMADIN) 5 MG tablet Take 1 to 1.5 tablets daily as directed by Coumadin clinic (Patient taking differently: Take 5-7.5 mg by mouth at bedtime. Take 5 mg on Tuesday and Thursday Take 7.5 mg all the other days) 135 tablet 1   No current facility-administered medications on file prior to visit.     Allergies  Allergen Reactions  . Acetaminophen-Codeine Other (See Comments)    Other reaction(s): Other (See Comments) Extreme constipation Extreme constipation   . Amoxicillin Itching and Rash    Unknown-maybe a rash?  . Diovan [Valsartan] Other (See Comments)    Unknown     Assessment/Plan:  1. Hypertriglyceridemia: Pt is indicated for Vascepa considering his TG are >150 mg/dL, he has cardiovascular disease, and he is on max dose statin therapy. Pt is tolerating statin therapy. Price of Vascepa on [employer] plan is $95 for 30 day supply and $275 for a 90 day supply. Pt finds these prices affordable "especially compared to how they used to be". Educated pt on mechanism of action, dosing, side effects, efficacy, and mortality benefit of Vacepa; pt verbalized understanding. Scheduled f/u fasting lipid panel and LFTs on 07/06/18. Plan to f/u with pt  on 07/07/18 regarding lipid panel/LFT results on 07/07/18.  Thank you for involving pharmacy to assist in providing Brandon Klein's care.   Zachery Conch, PharmD PGY2 Ambulatory Care Pharmacy Resident

## 2019-03-31 ENCOUNTER — Ambulatory Visit: Payer: Medicare Other | Attending: Cardiovascular Disease | Admitting: Physical Therapy

## 2019-03-31 ENCOUNTER — Institutional Professional Consult (permissible substitution): Payer: Medicare Other | Admitting: Thoracic Surgery (Cardiothoracic Vascular Surgery)

## 2019-03-31 ENCOUNTER — Encounter: Payer: Self-pay | Admitting: Thoracic Surgery (Cardiothoracic Vascular Surgery)

## 2019-03-31 ENCOUNTER — Telehealth: Payer: Self-pay | Admitting: Pharmacist

## 2019-03-31 ENCOUNTER — Other Ambulatory Visit: Payer: Self-pay

## 2019-03-31 ENCOUNTER — Encounter: Payer: Self-pay | Admitting: Physical Therapy

## 2019-03-31 VITALS — BP 134/81 | HR 75 | Temp 97.7°F | Resp 20 | Ht 67.0 in | Wt 246.0 lb

## 2019-03-31 DIAGNOSIS — I350 Nonrheumatic aortic (valve) stenosis: Secondary | ICD-10-CM

## 2019-03-31 DIAGNOSIS — I5042 Chronic combined systolic (congestive) and diastolic (congestive) heart failure: Secondary | ICD-10-CM | POA: Diagnosis not present

## 2019-03-31 DIAGNOSIS — R2689 Other abnormalities of gait and mobility: Secondary | ICD-10-CM

## 2019-03-31 DIAGNOSIS — Z951 Presence of aortocoronary bypass graft: Secondary | ICD-10-CM | POA: Diagnosis not present

## 2019-03-31 NOTE — Therapy (Signed)
[place], [place], [address] Phone: [phone]   Fax:  [phone]  Physical Therapy Evaluation  Patient Details  Name: Brandon Klein MRN: [id number] Date of Birth: Oct 14, 1940 Referring Provider (PT): Burnell Blanks, MD   Encounter Date: 03/31/2019  PT End of Session - 03/31/19 1057    Visit Number  1    PT Start Time  6962    PT Stop Time  1140    PT Time Calculation (min)  43 min       Past Medical History:  Diagnosis Date  . AS (aortic stenosis)    moderate AS 2018  . Carotid artery plaque    a. Duplex 10/2013: mild calcific plaque origin ICA. Left: intimal wall thickening CCA. 0-39% BICA.  Marland Kitchen Chronic renal insufficiency   . Chronic systolic dysfunction of left ventricle   . Coronary artery disease    a. s/p PCI of LAD 1997. b. Cutting balloon angioplasty to LCx in 2002. c. s/p cath 11/2013 with severe 3 vessel ASCAD s/p CABG with LIMA to LAD, SVG to diag, SVG to left circ and SVG to PDA. d. cath 07/07/2014 occluded SVG to PDA, all other grafts patent. 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 DJD (degenerative joint disease)   . Epistaxis   . GERD (gastroesophageal reflux disease)   . GI bleeding    a.  with benign gastric polypectomy 05/2014.  Marland Kitchen HLD (hyperlipidemia)   . HTN (hypertension)   . Hypokalemia   . Hypomagnesemia   . Impaired fasting glucose    A1C check every year  . Obesity   . OSA (obstructive sleep apnea)   . Pacemaker    a. 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 b. MDT generator replacement 2012.  Marland Kitchen PAF (paroxysmal atrial fibrillation) (HCC)    on chronic systemic anticoagulation  . PVC's (premature ventricular contractions)   . Syncope    a. vasovagal with documented bradycardia.  . Type 2 diabetes mellitus without complications ([place]) 02/01/2840    Past Surgical History:  Procedure Laterality Date  . ANGIOPLASTY  1997  . CHOLECYSTECTOMY    . COLONOSCOPY  WITH PROPOFOL N/A 05/05/2014   Procedure: COLONOSCOPY WITH PROPOFOL;  Surgeon: Garlan Fair, MD;  Location: [facility] ENDOS[facility];  Service: Endoscopy;  Laterality: N/A;  . CORONARY ARTERY BYPASS GRAFT N/A 12/01/2013   Procedure: CORONARY ARTERY BYPASS GRAFT TIMES FOUR USING LEFT INTERNAL MAMMARY ARTERY TO LAD, SAPHENOUS VEIN GRAFTS TO DIAGONAL, CIRCUMFELX AND POSTERIOR DESCENDING;  Surgeon: Ivin Poot, MD;  Location: [place];  Service: Open Heart Surgery;  Laterality: N/A;  . CORONARY STENT INTERVENTION N/A 03/21/2019   Procedure: CORONARY STENT INTERVENTION;  Surgeon: Burnell Blanks, MD;  Location: [facility];  Service: Cardiovascular;  Laterality: N/A;  Distal RCA  . ESOPHAGOGASTRODUODENOSCOPY (EGD) WITH PROPOFOL N/A 05/05/2014   Procedure: ESOPHAGOGASTRODUODENOSCOPY (EGD) WITH PROPOFOL;  Surgeon: Garlan Fair, MD;  Location: [facility];  Service: Endoscopy;  Laterality: N/A;  . KNEE ARTHROSCOPY  2000   Left  . LEFT AND RIGHT HEART CATHETERIZATION WITH CORONARY/GRAFT ANGIOGRAM N/A 07/07/2014   Procedure: LEFT AND RIGHT HEART CATHETERIZATION WITH Beatrix Fetters;  Surgeon: Burnell Blanks, MD;  Location: [facility] CATH LAB;  Service: Cardiovascular;  Laterality: N/A;  . LEFT HEART CATHETERIZATION WITH CORONARY ANGIOGRAM N/A 11/25/2013   Procedure: LEFT HEART CATHETERIZATION WITH CORONARY ANGIOGRAM;  Surgeon: Sueanne Margarita, MD;  [facility];  Service: Cardiovascular;  Laterality: N/A;  . PACEMAKER INSERTION  2002   generator change MDT by Dr Graciela Husbands in 2012  . RIGHT/LEFT HEART CATH AND CORONARY/GRAFT ANGIOGRAPHY N/A 03/21/2019   Procedure: RIGHT/LEFT HEART CATH AND CORONARY/GRAFT ANGIOGRAPHY;  Surgeon: Kathleene Hazel, MD;  Location: [facility]VAS[facility] CV LAB;  Service: Cardiovascular;  Laterality: N/A;  . TEE WITHOUT CARDIOVERSION N/A 08/31/2014   Procedure: TRANSESOPHAGEAL ECHOCARDIOGRAM (TEE);  Surgeon: [name], MD;  Location: [facility] ENDOSCOPY;  Service:  Cardiovascular;  Laterality: N/A;    There were no vitals filed for this visit.   Subjective Assessment - 03/31/19 1101    Subjective  Pt reports progressive dyspnea with exertion and fatigue, harder to climb stairs, need more rests.    Patient Stated Goals  to gain stamina    Currently in Pain?  No/denies         [facility] PT Assessment - 03/31/19 0001      Assessment   Medical Diagnosis  severe aortic stenosis    Referring Provider (PT)  Kathleene Hazel, MD    Onset Date/Surgical Date  09/28/18   approximate     Precautions   Precautions  None      Restrictions   Weight Bearing Restrictions  No      Balance Screen   Has the patient fallen in the past 6 months  No    Has the patient had a decrease in activity level because of a fear of falling?   No    Is the patient reluctant to leave their home because of a fear of falling?   No      Home Public house manager residence    Living Arrangements  Spouse/significant other    Home Access  Stairs to enter    Entrance Stairs-Number of Steps  2    Entrance Stairs-Rails  None    Home Layout  Two level    Additional Comments  uses SPC due to knee OA      Prior Function   Level of Independence  Independent      ROM / Strength   AROM / PROM / Strength  AROM;Strength      AROM   Overall AROM Comments  grossly WNL      Strength   Overall Strength Comments  grossly 4/5    Strength Assessment Site  Hand    Right/Left hand  Right;Left    Right Hand Grip (lbs)  71   R hand dominant   Left Hand Grip (lbs)  65      Ambulation/Gait   Gait Comments  Pt ambulates with SPC with widened BOS and antalgic gait. Limited foot clearance bilaterally.       6 Minute Walk- Baseline   6 Minute Walk- Baseline  yes    BP (mmHg)  99/54    HR (bpm)  74    02 Sat (%RA)  96 %    Modified Borg Scale for Dyspnea  0- Nothing at all    Perceived Rate of Exertion (Borg)  6-      6 Minute walk- Post Test   6 Minute  Walk Post Test  yes    BP (mmHg)  119/65    HR (bpm)  99    02 Sat (%RA)  96 %    Modified Borg Scale for Dyspnea  1- Very mild shortness of breath    Perceived Rate of Exertion (Borg)  11- Fairly light  6 minute walk test results    Aerobic Endurance Distance Walked  675       [facility] Pre-Surgical Assessment - 03/31/19 0001    5 Meter Walk Test- trial 1  10 sec    5 Meter Walk Test- trial 2  9 sec.     5 Meter Walk Test- trial 3  9 sec.    5 meter walk test average  9.33 sec    4 Stage Balance Test tolerated for:   10 sec.    4 Stage Balance Test Position  2    Sit To Stand Test- trial 1  14 sec.    ADL/IADL Independent with:  Bathing;Dressing;Meal prep;Finances    ADL/IADL Needs Assistance with:  Pincus Badder work    ADL/IADL Fraility Index  Vulnerable              Objective measurements completed on examination: See above findings.              PT Education - 03/31/19 1158    Education Details  fall risk, continued use of SPC, return to cardiac rehab maintenance program or alternative post-surgery once cleared for physical fitness    Person(s) Educated  Patient    Methods  Explanation    Comprehension  Verbalized understanding                  Plan - 03/31/19 1200    Clinical Impression Statement  see clinical impression statement    Stability/Clinical Decision Making  Stable/Uncomplicated    Clinical Decision Making  Low    PT Next Visit Plan  eval only    Consulted and Agree with Plan of Care  Patient      Clinical Impression Statement: Pt is a 78 yo male presenting to OP PT for evaluation prior to possible TAVR surgery due to severe aortic stenosis. Pt reports onset of progressive dyspnea with exertion and fatigue approximately 6 months ago, but more noticeable since return from trip to [place] in August. Symptoms are limiting speed and number of daily activities he is able to complete. Pt presents with good ROM and strength, reduced balance  and is at high fall risk 4 stage balance test, slow walking speed and fair aerobic endurance per 6 minute walk test. Pt ambulated a total of 675 feet in 6 minute walk. Likely a combination of knee OA and aerobic function. Pt is aware of limitations and modifies activities accordingly. Based on the Short Physical Performance Battery, patient has a frailty rating of 5/12 with </= 5/12 considered frail.   Patient will benefit from skilled therapeutic intervention in order to improve the following deficits and impairments:     Visit Diagnosis: Other abnormalities of gait and mobility     Problem List Patient Active Problem List   Diagnosis Date Noted  . Unstable angina (HCC)   . Pacemaker   . Obesity   . Impaired fasting glucose   . GI bleeding   . GERD (gastroesophageal reflux disease)   . Epistaxis   . DJD (degenerative joint disease)   . Chronic renal insufficiency   . CAD (coronary artery disease) 12/21/2016  . Anemia 12/02/2015  . Nonrheumatic aortic valve disorder 12/02/2015  . Type 2 diabetes mellitus without complications (HCC) 12/02/2015  . Gastro-esophageal reflux disease without esophagitis 12/02/2015  . Other fecal abnormalities 12/02/2015  . Hypertension 12/02/2015  . Osteoarthritis 12/02/2015  . Dizziness and giddiness 12/02/2015  . Encounter for therapeutic drug monitoring 08/07/2014  .  History of GI bleed 2/2 duodenal polyp 08/04/2014  . Upper airway cough syndrome 04/28/2014  . Breath shortness 04/13/2014  . Carotid atherosclerosis   . OSA (obstructive sleep apnea)   . PVC's (premature ventricular contractions)   . Hypomagnesemia   . Hypokalemia   . Chronic combined systolic and diastolic CHF (congestive heart failure) (HCC)   . Obesity (BMI 30-39.9) 02/04/2014  . AS (aortic stenosis)   . Chronic anticoagulation 11/27/2013  . Hyperglycemia 11/26/2013  . PAF (paroxysmal atrial fibrillation) (HCC)   . Dyslipidemia 07/08/2013  . Bradycardia s/p MDT pacemaker    . Syncope     ,, PT, DPT 03/31/2019, 12:02 PM  [facility] [address] [place], [place], [address] Phone: [phone]   Fax:  [phone]  Name: Brandon Klein MRN: [id number] Date of Birth: [birth year]

## 2019-03-31 NOTE — H&P (View-Only) (Signed)
[facility]  MULTIDISCIPLINARY HEART VALVE CLINIC  CARDIOTHORACIC SURGERY CONSULTATION REPORT  Referring Provider is Quintella Reicherturner, Traci R, MD PCP is Juluis RainierBarnes, Elizabeth, MD  Chief Complaint  Patient presents with   Aortic Stenosis    TAVR consultation    HPI:  Patient is 78 year old obese male with history of aortic stenosis, coronary artery disease status post coronary artery bypass grafting x4 in 2015, chronic combined systolic and diastolic congestive heart failure, symptomatic bradycardia and recurrent paroxysmal atrial fibrillation status post permanent pacemaker in the remote past on long-term warfarin anticoagulation, hypertension, hyperlipidemia, borderline type 2 diabetes mellitus, stage II chronic kidney disease, and obstructive sleep apnea who has been referred for surgical consultation to discuss treatment options for management of severe symptomatic aortic stenosis.  Patient's cardiac history dates back more than 20 years ago when he first developed paroxysmal atrial fibrillation and symptomatic tachycardia.  He underwent permanent pacemaker placement and has since then undergone generator change at end-of-life.  He has been on long-term warfarin anticoagulation for many years.  Interrogation of his pacemaker demonstrates that he remains in atrial paced rhythm much of the time with occasional episodes of paroxysmal atrial fibrillation and PVCs, none of which are symptomatic.  He developed coronary artery disease and initially was treated with PCI and stenting but later underwent coronary artery bypass grafting x4 in 2015.  Early following his surgery he was noted to have severe left ventricular systolic dysfunction with ejection fraction estimated only 20 to 25%.  This gradually improved over time.  He was noted to have aortic stenosis which was initially felt to be mild and has gradually progressed in severity.  Recent follow-up echocardiogram performed January 31, 2019  revealed stable left ventricular systolic function with ejection fraction estimated 45 to 50%.  There was severe aortic stenosis with peak velocity across aortic valve reported 3.7 m/s corresponding to mean transvalvular gradient of 33 mmHg but aortic valve area calculated only 0.90 cm with DVI notably only 0.16.  The patient was referred to the multidisciplinary heart valve clinic and has been evaluated previously by Dr. Clifton JamesMcAlhany.  He underwent diagnostic cardiac catheterization on March 21, 2019 which revealed stable coronary anatomy with severe three-vessel coronary artery disease but continued patency of 3 out of the 4 previously placed bypass grafts including the left internal mammary artery graft to the distal left anterior descending coronary artery and saphenous vein grafts to the diagonal branch and the obtuse marginal branch of the left circumflex system.  There was chronic occlusion of the saphenous vein graft to the posterior descending coronary artery.  The patient was noted to have severe stenosis of distal right coronary artery and underwent successful PCI and stenting of the native distal right coronary artery at that time.  Peak to peak and mean transvalvular gradients across the aortic valve were measured 32 and 23.3 mmHg, respectively.  Pulmonary artery pressures were mildly elevated.  CT angiography was performed and the patient referred for surgical consultation.  Patient is married and lives locally in [place] with his wife.  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  He lives a fairly sedentary lifestyle.  He did participate in the cardiac rehab program religiously ever since his heart surgery in 2015 until this spring when the program was terminated because of COVID-19.  Over the past 6 months he has been more sedentary and does not exercise much at all on a  regular basis.  He admits to gradual  progression of symptoms of exertional fatigue and shortness of breath.  He denies resting shortness of breath, PND, orthopnea, or lower extremity edema.  He has not had any chest pain or chest tightness either with activity or at rest.  He has not had any dizzy spells or syncope.  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  He now walks using a cane for stability.  He has not had any mechanical falls.  Past Medical History:  Diagnosis Date   AS (aortic stenosis)    moderate AS 2018   Carotid artery plaque    a. Duplex 10/2013: mild calcific plaque origin ICA. Left: intimal wall thickening CCA. 0-39% BICA.   Chronic renal insufficiency    Chronic systolic dysfunction of left ventricle    Coronary artery disease    a. s/p PCI of LAD 1997. b. Cutting balloon angioplasty to LCx in 2002. c. s/p cath 11/2013 with severe 3 vessel ASCAD s/p CABG with LIMA to LAD, SVG to diag, SVG to left circ and SVG to PDA. d. cath 07/07/2014 occluded SVG to PDA, all other grafts patent. 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  with benign gastric polypectomy 05/2014.   HLD (hyperlipidemia)    HTN (hypertension)    Hypokalemia    Hypomagnesemia    Impaired fasting glucose    A1C check every year   Obesity    OSA (obstructive sleep apnea)    Pacemaker    a. 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 b. MDT generator replacement 2012.   PAF (paroxysmal atrial fibrillation) (HCC)    on chronic systemic anticoagulation   PVC's (premature ventricular contractions)    S/P CABG x 4 12/01/2013   LIMA to LAD, SVG to Diag, SVG to OM, SVG to PDA   Syncope    a. vasovagal with documented bradycardia.   Type 2 diabetes mellitus without complications (HCC) 12/02/2015    Past Surgical History:  Procedure Laterality Date    ANGIOPLASTY  1997   CHOLECYSTECTOMY     COLONOSCOPY WITH PROPOFOL N/A 05/05/2014   Procedure: COLONOSCOPY WITH PROPOFOL;  Surgeon: Charolett Bumpers, MD;  Location: [facility] ENDOSCOPY;  Service: Endoscopy;  Laterality: N/A;   CORONARY ARTERY BYPASS GRAFT N/A 12/01/2013   Procedure: CORONARY ARTERY BYPASS GRAFT TIMES FOUR USING LEFT INTERNAL MAMMARY ARTERY TO LAD, SAPHENOUS VEIN GRAFTS TO DIAGONAL, CIRCUMFELX AND POSTERIOR DESCENDING;  Surgeon: Kerin Perna, MD;  Location: [facility] OR;  Service: Open Heart Surgery;  Laterality: N/A;   CORONARY STENT INTERVENTION N/A 03/21/2019   Procedure: CORONARY STENT INTERVENTION;  Surgeon: Kathleene Hazel, MD;  Location: [facility] INVASIVE CV LAB;  Service: Cardiovascular;  Laterality: N/A;  Distal RCA   ESOPHAGOGASTRODUODENOSCOPY (EGD) WITH PROPOFOL N/A 05/05/2014   Procedure: ESOPHAGOGASTRODUODENOSCOPY (EGD) WITH PROPOFOL;  Surgeon: Charolett Bumpers, MD;  Location: [facility];  Service: Endoscopy;  Laterality: N/A;   KNEE ARTHROSCOPY  2000   Left   LEFT AND RIGHT HEART CATHETERIZATION WITH CORONARY/GRAFT ANGIOGRAM N/A 07/07/2014   Procedure: LEFT AND RIGHT HEART CATHETERIZATION WITH Isabel Caprice;  Surgeon: Kathleene Hazel, MD;  Location: [facility] CATH LAB;  Service: Cardiovascular;  Laterality: N/A;   LEFT HEART CATHETERIZATION WITH CORONARY ANGIOGRAM N/A 11/25/2013   Procedure: LEFT HEART CATHETERIZATION WITH  CORONARY ANGIOGRAM;  Surgeon: Quintella Reichertraci R Turner, MD;  Location: [facility] CATH LAB;  Service: Cardiovascular;  Laterality: N/A;   PACEMAKER INSERTION  2002   generator change MDT by Dr Graciela HusbandsKlein in 2012   RIGHT/LEFT HEART CATH AND CORONARY/GRAFT ANGIOGRAPHY N/A 03/21/2019   Procedure: RIGHT/LEFT HEART CATH AND CORONARY/GRAFT ANGIOGRAPHY;  Surgeon: Kathleene HazelMcAlhany, Christopher D, MD;  Location: [facility] INVASIVE CV LAB;  Service: Cardiovascular;  Laterality: N/A;   TEE WITHOUT CARDIOVERSION N/A 08/31/2014   Procedure: TRANSESOPHAGEAL ECHOCARDIOGRAM (TEE);  Surgeon:  Quintella Reichertraci R Turner, MD;  [facility];  Service: Cardiovascular;  Laterality: N/A;    Family History  Problem Relation Age of Onset   Colon cancer Mother        deceased   Heart attack Mother    Leukemia Father        deceased   Heart attack Brother        deceased    Social History   Socioeconomic History   Marital status: Married    Spouse name: Not on file   Number of children: Not on file   Years of education: Not on file   Highest education level: Not on file  Occupational History   Not on file  Social Needs   Financial resource strain: Not on file   Food insecurity    Worry: Not on file    Inability: Not on file   Transportation needs    Medical: Not on file    Non-medical: Not on file  Tobacco Use   Smoking status: Former Smoker    Packs/day: 2.00    Years: 5.00    Pack years: 10.00    Types: Cigarettes    Quit date: 05/29/1966    Years since quitting: 52.8   Smokeless tobacco: Never Used  Substance and Sexual Activity   Alcohol use: Yes    Alcohol/week: 0.0 standard drinks    Comment: ocassional   Drug use: No   Sexual activity: Not on file  Lifestyle   Physical activity    Days per week: Not on file    Minutes per session: Not on file   Stress: Not on file  Relationships   Social connections    Talks on phone: Not on file    Gets together: Not on file    Attends religious service: Not on file    Active member of club or organization: Not on file    Attends meetings of clubs or organizations: Not on file    Relationship status: Not on file   Intimate partner violence    Fear of current or ex partner: Not on file    Emotionally abused: Not on file    Physically abused: Not on file    Forced sexual activity: Not on file  Other Topics Concern   Not on file  Social History Narrative   Lives in [place] with spouse.  Retired [employer]    Current Outpatient Medications  Medication Sig Dispense Refill    acetaminophen (TYLENOL) 500 MG tablet Take 1,000 mg by mouth 3 (three) times daily.      aspirin EC 81 MG EC tablet Take 1 tablet (81 mg total) by mouth daily.     atorvastatin (LIPITOR) 80 MG tablet TAKE 1 TABLET BY MOUTH  DAILY (Patient taking differently: Take 80 mg by mouth daily at 6 PM. ) 90 tablet 2   carvedilol (COREG) 25 MG tablet TAKE 1 TABLET(25 MG) BY MOUTH TWICE DAILY WITH A MEAL (Patient taking  differently: Take 25 mg by mouth 2 (two) times daily with a meal. ) 180 tablet 2   clopidogrel (PLAVIX) 75 MG tablet Take 1 tablet (75 mg total) by mouth daily with breakfast. 90 tablet 3   docusate sodium (COLACE) 100 MG capsule Take 200 mg by mouth daily.      fexofenadine (ALLEGRA) 180 MG tablet Take 180 mg by mouth daily.      fluticasone (FLONASE) 50 MCG/ACT nasal spray Place 2 sprays into the nose daily.      furosemide (LASIX) 20 MG tablet TAKE 1 TABLET BY MOUTH TWICE DAILY. WITH 80 MG TO TOTAL 100MG  (Patient taking differently: Take 20 mg by mouth 2 (two) times daily. TAKE 20 mg TABLET BY MOUTH TWICE DAILY. WITH 80 MG TO TOTAL 100MG ) 180 tablet 2   furosemide (LASIX) 80 MG tablet TAKE 1 TABLET BY MOUTH TWICE DAILY. TAKE WITH 20 MG TABLET (Patient taking differently: Take 80 mg by mouth 2 (two) times daily. TAKE 80 mg TABLET BY MOUTH TWICE DAILY. TAKE WITH 20 MG table for a total of 100 mg) 180 tablet 2   Glucosamine-Chondroit-Vit C-Mn (GLUCOSAMINE CHONDR 1500 COMPLX) CAPS Take 1 capsule by mouth every morning.      hydrALAZINE (APRESOLINE) 25 MG tablet TAKE 1 TABLET(25 MG) BY MOUTH THREE TIMES DAILY (Patient taking differently: Take 25 mg by mouth 3 (three) times daily. ) 270 tablet 2   Icosapent Ethyl (VASCEPA) 1 g CAPS Take 2 capsules (2 g total) by mouth 2 (two) times daily. 360 capsule 4   isosorbide mononitrate (IMDUR) 30 MG 24 hr tablet TAKE 1 TABLET BY MOUTH EVERY DAY (Patient taking differently: Take 30 mg by mouth daily. ) 90 tablet 2   L-Lysine 1000 MG TABS Take  1,000 mg by mouth daily.      meclizine (ANTIVERT) 25 MG tablet Take 25 mg by mouth daily as needed for dizziness.      Multiple Vitamin (MULTIVITAMIN) tablet Take 1 tablet by mouth daily.      nitroGLYCERIN (NITROSTAT) 0.4 MG SL tablet Place 1 tablet (0.4 mg total) under the tongue every 5 (five) minutes as needed for chest pain. 30 tablet 3   ONE TOUCH ULTRA TEST test strip 1 each by Other route every morning.      ONETOUCH DELICA LANCETS FINE MISC 1 each by Other route every morning.      pantoprazole (PROTONIX) 40 MG tablet Take 40 mg by mouth daily.      potassium chloride SA (K-DUR) 20 MEQ tablet TAKE 2 TABLETS BY MOUTH TWICE DAILY (Patient taking differently: Take 40 mEq by mouth 2 (two) times daily. ) 360 tablet 2   RA NATURAL MAGNESIUM 250 MG TABS TAKE 2 TABLETS TWICE DAILY. (Patient taking differently: Take 500 mg by mouth 2 (two) times daily. ) 120 tablet 5   spironolactone (ALDACTONE) 25 MG tablet TAKE 1 TABLET(25 MG) BY MOUTH DAILY (Patient taking differently: Take 25 mg by mouth daily. ) 90 tablet 2   warfarin (COUMADIN) 5 MG tablet Take 1 to 1.5 tablets daily as directed by Coumadin clinic (Patient taking differently: Take 5-7.5 mg by mouth at bedtime. Take 5 mg on Tuesday and Thursday Take 7.5 mg all the other days) 135 tablet 1   Omega-3 Fatty Acids (FISH OIL) 1000 MG CPDR Take 2,000 mg by mouth 2 (two) times daily.      PRESCRIPTION MEDICATION Place 1 drop into both eyes daily. Compounded eye drop  bromfenac ophthalmic solution 0.75%  Gatifloxacin prednisolone acetate 1 % eye drops,suspension     No current facility-administered medications for this visit.     Allergies  Allergen Reactions   Acetaminophen-Codeine Other (See Comments)    Other reaction(s): Other (See Comments) Extreme constipation Extreme constipation    Amoxicillin Itching and Rash    Unknown-maybe a rash?   Diovan [Valsartan] Other (See Comments)    Unknown       Review of  Systems:   General:  normal appetite, decreased energy, no weight gain, no weight loss, no fever  Cardiac:  no chest pain with exertion, no chest pain at rest, +SOB with exertion, no resting SOB, no PND, no orthopnea, no palpitations, no arrhythmia, no atrial fibrillation, no LE edema, no dizzy spells, no syncope  Respiratory:  + exertional shortness of breath, no home oxygen, no productive cough, no dry cough, no bronchitis, no wheezing, no hemoptysis, no asthma, no pain with inspiration or cough, + sleep apnea, + CPAP at night  GI:   no difficulty swallowing, no reflux, no frequent heartburn, no hiatal hernia, no abdominal pain, + constipation, no diarrhea, no hematochezia, no hematemesis, no melena  GU:   no dysuria,  no frequency, no urinary tract infection, no hematuria, no enlarged prostate, no kidney stones, no kidney disease  Vascular:  no pain suggestive of claudication, no pain in feet, no leg cramps, no varicose veins, no DVT, no non-healing foot ulcer  Neuro:   no stroke, no TIA's, no seizures, no headaches, no temporary blindness one eye,  no slurred speech, no peripheral neuropathy, no chronic pain, mild instability of gait, no memory/cognitive dysfunction  Musculoskeletal: + arthritis, + joint swelling, no myalgias, mild difficulty walking, somewhat reduced mobility   Skin:   no rash, no itching, no skin infections, no pressure sores or ulcerations  Psych:   no anxiety, no depression, no nervousness, no unusual recent stress  Eyes:   no blurry vision, no floaters, + recent vision changes - improved after bilateral cataract surgery, + wears glasses for reading  ENT:   no hearing loss, no loose or painful teeth, no dentures, last saw dentist June 2020  Hematologic:  + easy bruising, no abnormal bleeding, no clotting disorder, no frequent epistaxis  Endocrine:  no diabetes, does not check CBG's at home           Physical Exam:   BP 134/81 (BP Location: Right Arm)    Pulse 75     Temp 97.7 F (36.5 C) (Skin)    Resp 20    Ht 5\' 7"  (1.702 m)    Wt 246 lb (111.6 kg)    SpO2 95% Comment: RA   BMI 38.53 kg/m   General:  Obese,  well-appearing  HEENT:  Unremarkable   Neck:   no JVD, no bruits, no adenopathy   Chest:   clear to auscultation, symmetrical breath sounds, no wheezes, no rhonchi   CV:   RRR, grade II/VI crescendo/decrescendo murmur heard best at LSB,  no diastolic murmur  Abdomen:  soft, non-tender, no masses   Extremities:  warm, well-perfused, pulses diminished, no LE edema  Rectal/GU  Deferred  Neuro:   Grossly non-focal and symmetrical throughout  Skin:   Clean and dry, no rashes, no breakdown   Diagnostic Tests:  ECHOCARDIOGRAM REPORT       Patient Name:   Brandon Klein Date of Exam: 01/31/2019 Medical Rec #:  [id number]     Height:  67.0 in Accession #:    [id number]    Weight:       241.1 lb Date of Birth:  [birth year]     BSA:          2.19 m Patient Age:    78 years      BP:           122/66 mmHg Patient Gender: M             HR:           85 bpm. Exam Location:  [address]    Procedure: 2D Echo, Cardiac Doppler and Color Doppler  Indications:    I35.0   History:        Patient has prior history of Echocardiogram examinations, most                 recent 02/12/2018. Non ischemic cardiomyopathy CAD Prior CABG and                 Pacemaker Atrial Fibrillation and PVC AS Risk Factors: Former                 Smoker, Hypertension, Dyslipidemia and Diabetes.   Sonographer:    Samule Ohm RDCS Referring Phys: [id number] MICHELE M LENZE  IMPRESSIONS    1. The left ventricle has mildly reduced systolic function, with an ejection fraction of 45-50%. The cavity size was normal. There is moderate concentric left ventricular hypertrophy. Left ventricular diastolic Doppler parameters are consistent with  impaired relaxation. There is abnormal septal motion consistent with RV pacemaker. No evidence of left ventricular regional wall motion  abnormalities.  2. The right ventricle has normal systolic function. The cavity was normal. There is no increase in right ventricular wall thickness. Right ventricular systolic pressure is normal with an estimated pressure of 26.6 mmHg.  3. Left atrial size was mildly dilated.  4. Mild thickening of the mitral valve leaflet.  5. The aortic valve is tricuspid. Moderate thickening of the aortic valve. Moderate calcification of the aortic valve. Moderate-severe stenosis of the aortic valve.  6. The aorta is normal unless otherwise noted.  7. When compared to the prior study: 02/12/2018, aortic valve gradients have worsened. The dimensionless obstructive index and the AV area calculation exaggerates the reduction in valve area due to erroneous location of pulsed Doppler sample volume  (which underestimates the true stroke volume). Nevertheless, the degree of aortic stenosis is approaching the severe range.  FINDINGS  Left Ventricle: The left ventricle has mildly reduced systolic function, with an ejection fraction of 45-50%. The cavity size was normal. There is moderate concentric left ventricular hypertrophy. Left ventricular diastolic Doppler parameters are  consistent with impaired relaxation. Normal left ventricular filling pressures There is abnormal (paradoxical) septal motion, consistent with RV pacemaker. No evidence of left ventricular regional wall motion abnormalities.. Septal-lateral dyssynchrony.  Right Ventricle: The right ventricle has normal systolic function. The cavity was normal. There is no increase in right ventricular wall thickness. Right ventricular systolic pressure is normal with an estimated pressure of 26.6 mmHg. Pacing  wire/catheter visualized in the right ventricle.  Left Atrium: Left atrial size was mildly dilated.  Right Atrium: Right atrial size was normal in size. Right atrial pressure is estimated at 10 mmHg. pacemaker lead is seen.. Pacemaker lead is  seen.  Interatrial Septum: No atrial level shunt detected by color flow Doppler.  Pericardium: There is no evidence of pericardial effusion.  Mitral Valve: The mitral valve is  normal in structure. Mild thickening of the mitral valve leaflet. Mitral valve regurgitation is trivial by color flow Doppler.  Tricuspid Valve: The tricuspid valve is normal in structure. Tricuspid valve regurgitation is mild by color flow Doppler.  Aortic Valve: The aortic valve is tricuspid Moderate thickening of the aortic valve, with moderately decreased cusp excursion. Moderate calcification of the aortic valve. Aortic valve regurgitation was not visualized by color flow Doppler. There is  Moderate-severe stenosis of the aortic valve, with a calculated valve area of 0.85 cm.  Pulmonic Valve: The pulmonic valve was grossly normal. Pulmonic valve regurgitation is not visualized by color flow Doppler.  Aorta: The aorta is normal unless otherwise noted.  Venous: The inferior vena cava measures 1.00 cm, is normal in size with greater than 50% respiratory variability.  Compared to previous exam: 02/12/2018, aortic valve gradients have worsened. The dimensionless obstructive index and the AV area calculation exaggerates the reduction in valve area due to erroneous location of pulsed Doppler sample volume (which  underestimates the true stroke volume). Nevertheless, the degree of aortic stenosis is approaching the severe range.    +--------------+--------++  LEFT VENTRICLE            +----------------+---------++ +--------------+--------++  Diastology                    PLAX 2D                   +----------------+---------++ +--------------+--------++  LV e' lateral:   6.74 cm/s    LVIDd:         5.55 cm    +----------------+---------++ +--------------+--------++  LV E/e' lateral: 5.2          LVIDs:         4.65 cm    +----------------+---------++ +--------------+--------++  LV e' medial:    4.90  cm/s    LV PW:         1.40 cm    +----------------+---------++ +--------------+--------++  LV E/e' medial:  7.2          LV IVS:        1.50 cm    +----------------+---------++ +--------------+--------++  LVOT diam:     2.61 cm    +--------------+--------++  LV SV:         51 ml      +--------------+--------++  LV SV Index:   21.83      +--------------+--------++  LVOT Area:     5.37 cm   +--------------+--------++                            +--------------+--------++  +---------------+---------++  RIGHT VENTRICLE             +---------------+---------++  RV S prime:     5.77 cm/s   +---------------+---------++  TAPSE (M-mode): 0.7 cm      +---------------+---------++  RVSP:           19.6 mmHg   +---------------+---------++  +---------------+-------++-----------++  LEFT ATRIUM              Index         +---------------+-------++-----------++  LA diam:        4.30 cm  1.96 cm/m    +---------------+-------++-----------++  LA Vol (A2C):   83.1 ml  37.95 ml/m   +---------------+-------++-----------++  LA Vol (A4C):   55.1 ml  25.17 ml/m   +---------------+-------++-----------++  LA Biplane Vol: 68.9 ml  31.47 ml/m   +---------------+-------++-----------++ +------------+---------++-----------++  RIGHT ATRIUM            Index         +------------+---------++-----------++  RA Pressure: 3.00 mmHg                +------------+---------++-----------++  RA Area:     21.90 cm                +------------+---------++-----------++  RA Volume:   66.50 ml   30.37 ml/m   +------------+---------++-----------++  +------------------+------------++  AORTIC VALVE                      +------------------+------------++  AV Area (Vmax):    0.90 cm       +------------------+------------++  AV Area (Vmean):   0.90 cm       +------------------+------------++  AV Area (VTI):     0.85 cm       +------------------+------------++  AV Vmax:           368.67 cm/s     +------------------+------------++  AV Vmean:          269.000 cm/s   +------------------+------------++  AV VTI:            0.843 m        +------------------+------------++  AV Peak Grad:      54.4 mmHg      +------------------+------------++  AV Mean Grad:      33.0 mmHg      +------------------+------------++  LVOT Vmax:         61.51 cm/s     +------------------+------------++  LVOT Vmean:        45.316 cm/s    +------------------+------------++  LVOT VTI:          0.133 m        +------------------+------------++  LVOT/AV VTI ratio: 0.16           +------------------+------------++   +------------+-------++  AORTA                  +------------+-------++  Ao Asc diam: 3.60 cm   +------------+-------++  +--------------+--------++   +---------------+-----------++  MITRAL VALVE                 TRICUSPID VALVE               +--------------+--------++   +---------------+-----------++  MV Area (PHT):               TR Peak grad:   16.6 mmHg     +--------------+--------++   +---------------+-----------++                               TR Vmax:        208.00 cm/s   +--------------+--------++   +---------------+-----------++  MV Decel Time: 240 msec      Estimated RAP:  3.00 mmHg     +--------------+--------++   +---------------+-----------++ +--------------+----------++  RVSP:           19.6 mmHg      MV E velocity: 35.20 cm/s   +---------------+-----------++ +--------------+----------++  MV A velocity: 70.40 cm/s   +--------------+-------+ +--------------+----------++  SHUNTS                   MV E/A ratio:  0.50         +--------------+-------+ +--------------+----------++  Systemic VTI:  0.13 m                                +--------------+-------+  Systemic Diam: 2.61 cm                               +--------------+-------+  +---------+-------+  IVC                +---------+-------+  IVC diam: 1.00 cm  +---------+-------+     Thurmon Fair MD Electronically signed by Thurmon Fair MD Signature Date/Time: 01/31/2019/5:54:28 PM     CORONARY STENT INTERVENTION  RIGHT/LEFT HEART CATH AND CORONARY/GRAFT ANGIOGRAPHY  Conclusion    Mid RCA lesion is 30% stenosed.  Dist RCA-1 lesion is 90% stenosed.  Dist RCA-2 lesion is 90% stenosed.  SVG graft was not injected.  Origin to Prox Graft lesion is 100% stenosed.  Ramus lesion is 80% stenosed.  Mid Cx lesion is 100% stenosed.  SVG graft was visualized by angiography.  SVG graft was visualized by angiography.  Mid LAD lesion is 100% stenosed.  A drug-eluting stent was successfully placed using a STENT SYNERGY DES [id number].  Post intervention, there is a 40% residual stenosis.  Post intervention, there is a 0% residual stenosis.   1. 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 Known occlusion SVG to PDA. Severe stenosis distal RCA.  3. Successful PTCA/DES x 1 distal RCA.  4. Chronic occlusion mid LAD. Patent LIMA to mid LAD. Patent SVG to Diagonal 5. Chronic occlusion mid Circumflex. Patent SVG to OM.  6. Severe disease in the small to moderate caliber intermediate branch 7. Severe aortic stenosis  By echo. Cath data with mean gradient 23.3 mmHg, peak to peak gradient 32 mm Hg).   Will continue planning for TAVR. Will continue ASA, Plavix and coumadin for now. Will continue DAPT with ASA and Plavix until after his TAVR procedure.    Recommendations  Antiplatelet/Anticoag Will continue ASA, Plavix and coumadin for now. Will continue DAPT with ASA and Plavix until after his TAVR procedure.  Indications  Unstable angina (HCC) [I20.0 (ICD-10-CM)]  Procedural Details  Technical Details Indication: Severe AS and dyspnea concerning for angina. Pre-TAVR cardiac cath.   Procedure: The risks, benefits, complications, treatment options, and expected outcomes were discussed with the patient. The patient and/or family  concurred with the proposed plan, giving informed consent. The patient was brought to the cath lab after IV hydration was given. The patient was sedated with Versed and Fentanyl. The IV catheter in the right antecubital vein was prepped and draped. I changed out this catheter for a 5 French sheath. Right heart catheterization performed with a balloon tipped catheter. The left wrist was prepped and draped in a sterile fashion. 1% lidocaine was used for local anesthesia. Using the modified Seldinger access technique, a 5 French sheath was placed in the left radial artery. 3 mg Verapamil was given through the sheath. 5500 units IV heparin was given. Standard diagnostic catheters were used to perform selective coronary angiography. I crossed the aortic valve with an AL-2 and a straight wire. LV pressures measured.   PCI Note: The RCA was engaged with a JR4 guiding catheter. IV heparin used for anti-coagulation. ACT over 290. Cougar IC wire passed down the RCA. A 2.0 x 15 mm balloon was used to dilate the distal lesions. Calcification noted but felt to be moderate. I then deployed a 2.75 x 32 mm synergy DES in the distal RCA. The stent was post-dilated with a 3.25 mm [place] balloon. The more proximal lesion would not  fully expand due to vessel calcification. I tried multiple short [place] balloons at high pressure.    The sheath was removed from the right radial artery and a Terumo hemostasis band was applied at the arteriotomy site on the right wrist.    Estimated blood loss <50 mL.   During this procedure medications were administered to achieve and maintain moderate conscious sedation while the patient's heart rate, blood pressure, and oxygen saturation were continuously monitored and I was present face-to-face 100% of this time.  Medications (Filter: Administrations occurring from 03/21/19 1100 to 03/21/19 1252) (important)  Continuous medications are totaled by the amount administered until 03/21/19 1252.   Medication Rate/Dose/Volume Action  Date Time   fentaNYL (SUBLIMAZE) injection (mcg) 25 mcg Given 03/21/19 1118   Total dose as of 03/21/19 1252 25 mcg Given 1225   50 mcg        midazolam (VERSED) injection (mg) 1 mg Given 03/21/19 1118   Total dose as of 03/21/19 1252 1 mg Given 1225   2 mg        lidocaine (PF) (XYLOCAINE) 1 % injection (mL) 2 mL Given 03/21/19 1121   Total dose as of 03/21/19 1252 2 mL Given 1128   4 mL        heparin injection (Units) 5,500 Units Given 03/21/19 1135   Total dose as of 03/21/19 1252 6,000 Units Given 1150   14,500 Units 3,000 Units Given 1206   clopidogrel (PLAVIX) tablet (mg) 600 mg Given 03/21/19 1203   Total dose as of 03/21/19 1252        600 mg        famotidine (PEPCID) IVPB 20 mg premix (mg) 20 mg New Bag/Given 03/21/19 1209   Total dose as of 03/21/19 1252        Cannot be calculated        iohexol (OMNIPAQUE) 350 MG/ML injection (mL) 145 mL Given 03/21/19 1245   Total dose as of 03/21/19 1252        145 mL        Heparin (Porcine) in NaCl 1000-0.9 UT/500ML-% SOLN (mL) 500 mL Given 03/21/19 1245   Total dose as of 03/21/19 1252 500 mL Given 1245   1,000 mL        Radial Cocktail/Verapamil only (mL) 10 mL Given 03/21/19 1130   Total dose as of 03/21/19 1252        10 mL        Sedation Time  Sedation Time Physician-1: 1 hour 12 minutes 22 seconds  Contrast  Medication Name Total Dose  iohexol (OMNIPAQUE) 350 MG/ML injection 145 mL    Radiation/Fluoro  Fluoro time: 17.7 (min) DAP: 143716 (mGycm2) Cumulative Air Kerma: 3791 (mGy)  Complications  Complications documented before study signed (03/24/2019 7:00 AM)     Log Level Complications  None Documented by Kathleene Hazel, MD 03/21/2019 12:56 PM  Date Found: 03/21/2019  Time Range: Intraprocedure    Coronary Findings  Diagnostic Dominance: Right Left Anterior Descending  Vessel is large.  Mid LAD lesion 100% stenosed  Mid LAD lesion is 100% stenosed. The  lesion is chronically occluded.  Second Diagonal Branch  Vessel is moderate in size.  Ramus Intermedius  Vessel is small.  Ramus lesion 80% stenosed  Ramus lesion is 80% stenosed.  Left Circumflex  Vessel is large.  Mid Cx lesion 100% stenosed  Mid Cx lesion is 100% stenosed. The lesion is chronically occluded.  Right Coronary Artery  Vessel  is large.  Mid RCA lesion 30% stenosed  Mid RCA lesion is 30% stenosed.  Dist RCA-1 lesion 90% stenosed  Dist RCA-1 lesion is 90% stenosed. The lesion is calcified.  Dist RCA-2 lesion 90% stenosed  Dist RCA-2 lesion is 90% stenosed. The lesion is calcified.  saphenous Graft to RPAV  SVG graft was not injected.  Origin to Prox Graft lesion 100% stenosed  Origin to Prox Graft lesion is 100% stenosed. The lesion is chronically occluded.  saphenous Graft to 3rd Mrg  SVG graft was visualized by angiography.  saphenous Graft to 2nd Diag  SVG graft was visualized by angiography.  LIMA Graft to Mid LAD  Intervention  Dist RCA-1 lesion  Stent (Also treats lesions: Dist RCA-2)  [place]IST[place] GUIDE 6FR JR4 guide catheter was inserted. Pre-stent angioplasty was performed using a BALLOON EMERGE MR 2.0X15. A drug-eluting stent was successfully placed using a STENT SYNERGY DES [id number]. Stent strut is well apposed. Post-stent angioplasty was performed using a BALLOON SAPPHIRE [place] 3.25X12.  Post-Intervention Lesion Assessment  The intervention was successful. Pre-interventional TIMI flow is 3. Post-intervention TIMI flow is 3. No complications occurred at this lesion.  There is a 40% residual stenosis post intervention.  Dist RCA-2 lesion  Stent (Also treats lesions: Dist RCA-1)  CATH [place]ISTA GUIDE 6FR JR4 guide catheter was inserted. Pre-stent angioplasty was performed using a BALLOON EMERGE MR 2.0X15. A drug-eluting stent was successfully placed using a STENT SYNERGY DES [id number]. Stent strut is well apposed. Post-stent angioplasty was performed using a BALLOON  SAPPHIRE [place] 3.25X12.  Post-Intervention Lesion Assessment  The intervention was successful. Pre-interventional TIMI flow is 3. Post-intervention TIMI flow is 3. No complications occurred at this lesion.  There is a 0% residual stenosis post intervention.  Coronary Diagrams  Diagnostic Dominance: Right  Intervention   Implants   Permanent Stent  Stent Synergy Des 2.75x32 - [id number] - Implanted  Inventory item: STENT SYNERGY DES 4.09W11 Model/Cat number: [id number]  Manufacturer: Wilkie Aye Lot number: [id number]  Device identifier: [id number] Device identifier type: GS1  GUDID Information  Request status Successful    Brand name: SYNERGY Version/Model: [id number]  Company name: [employer] MRI safety info as of 03/21/19: MR Conditional  Contains dry or latex rubber: No    GMDN P.T. name: Drug-eluting coronary artery stent, bioabsorbable-polymer-coated    As of 03/21/2019  Status: Implanted      Syngo Images  Show images for CARDIAC CATHETERIZATION  Images on Long Term Storage  Show images for Claudio, Mondry to Procedure Log  Procedure Log    Hemo Data   Most Recent Value  Fick Cardiac Output 5.49 L/min  Fick Cardiac Output Index 2.53 (L/min)/BSA  Aortic Mean Gradient 23.26 mmHg  Aortic Peak Gradient 32 mmHg  Aortic Valve Area 1.35  Aortic Value Area Index 0.62 cm2/BSA  RA A Wave 8 mmHg  RA V Wave 9 mmHg  RA Mean 7 mmHg  RV Systolic Pressure 23 mmHg  RV Diastolic Pressure 2 mmHg  RV EDP 6 mmHg  PA Systolic Pressure 22 mmHg  PA Diastolic Pressure 0 mmHg  PA Mean 15 mmHg  PW A Wave 8 mmHg  PW V Wave 7 mmHg  PW Mean 6 mmHg  AO Systolic Pressure 259 mmHg  AO Diastolic Pressure 60 mmHg  AO Mean 79 mmHg  LV Systolic Pressure 563 mmHg  LV Diastolic Pressure 7 mmHg  LV EDP 17 mmHg  AOp Systolic Pressure 875 mmHg  AOp Diastolic  Pressure 60 mmHg  AOp Mean Pressure 84 mmHg  LVp Systolic Pressure 150 mmHg  LVp  Diastolic Pressure 6 mmHg  LVp EDP Pressure 16 mmHg  QP/QS 1  TPVR Index 5.92 HRUI  TSVR Index 31.21 HRUI  PVR SVR Ratio 0.13  TPVR/TSVR Ratio 0.19     Cardiac TAVR CT  TECHNIQUE: The patient was scanned on a [employer]. A 120 kV retrospective scan was triggered in the descending thoracic aorta at 111 HU's. Gantry rotation speed was 250 msecs and collimation was .6 mm. No beta blockade or nitro were given. The 3D data set was reconstructed in 5% intervals of the R-R cycle. Systolic and diastolic phases were analyzed on a dedicated work station using MPR, MIP and VRT modes. The patient received 80 cc of contrast.  FINDINGS: Aortic Valve: Trileaflet aortic valve with severely thickened and calcified leaflets and severely restricted leaflet openings. Only minimal calcifications are extending into the LVOT under the non-coronary leaflet.  Aorta: Normal size with minimal atherosclerotic plaque and calcifications and no dissection.  Sinotubular Junction: 30 x 28 mm  Ascending Thoracic Aorta: 34 x 34 mm  Aortic Arch: 28 x 26 mm  Descending Thoracic Aorta: 25 x 24 mm  Sinus of Valsalva Measurements: Sinus are relatively shallow for annular size.  Non-coronary: 33 mm  Right -coronary: 33 mm  Left -coronary: 33 mm  Coronary Artery Height above Annulus:  Left Main: 12 mm  Right Coronary: 16 mm  Virtual Basal Annulus Measurements:  Maximum/Minimum Diameter: 29.3 x 26.2 mm  Mean Diameter: 27.7 mm  Perimeter: 89.5 mm  Area: 604 mm2  Optimum Fluoroscopic Angle for Delivery: LAO 4 CAU 3  IMPRESSION: 1. Trileaflet aortic valve with severely thickened and calcified leaflets and severely restricted leaflet openings. Only minimal calcifications are extending into the LVOT under the non-coronary leaflet. Aortic valve calcium score 2974 consistent with severe aortic stenosis. Annular measurements suitable for delivery of a 29 mm  Edwards-SAPIEN 3 valve.  2. Sufficient coronary to annulus distance.  3. Optimum Fluoroscopic Angle for Delivery: LAO 4 CAU 3.  4. No thrombus in the left atrial appendage.   Electronically Signed   By: Tobias Alexander   On: 03/27/2019 14:21        CT ANGIOGRAPHY CHEST, ABDOMEN AND PELVIS  TECHNIQUE: Multidetector CT imaging through the chest, abdomen and pelvis was performed using the standard protocol during bolus administration of intravenous contrast. Multiplanar reconstructed images and MIPs were obtained and reviewed to evaluate the vascular anatomy.  CONTRAST:  OMNIPAQUE IOHEXOL 350 MG/ML SOLN  COMPARISON:  Chest CTA 04/03/2014.  FINDINGS: CTA CHEST FINDINGS  Cardiovascular: Heart size is borderline enlarged. There is no significant pericardial fluid, thickening or pericardial calcification. There is aortic atherosclerosis, as well as atherosclerosis of the great vessels of the mediastinum and the coronary arteries, including calcified atherosclerotic plaque in the left main, left anterior descending, left circumflex and right coronary arteries. Status post median sternotomy for CABG including LIMA to the LAD. Severe thickening calcification of the aortic valve. Left-sided pacemaker device in place with lead tips terminating in the right atrium and right ventricular apex.  Mediastinum/Lymph Nodes: No pathologically enlarged mediastinal or hilar lymph nodes. Please note that accurate exclusion of hilar adenopathy is limited on noncontrast CT scans. Esophagus is unremarkable in appearance. No axillary lymphadenopathy.  Lungs/Pleura: No suspicious appearing pulmonary nodules or masses are noted. No acute consolidative airspace disease. No pleural effusions.  Musculoskeletal/Soft Tissues: Median sternotomy wires. There are  no aggressive appearing lytic or blastic lesions noted in the visualized portions of the skeleton.  CTA ABDOMEN AND  PELVIS FINDINGS  Hepatobiliary: No suspicious cystic or solid hepatic lesions. No intra or extrahepatic biliary ductal dilatation. Status post cholecystectomy.  Pancreas: No pancreatic mass. No pancreatic ductal dilatation. No pancreatic or peripancreatic fluid collections or inflammatory changes.  Spleen: Unremarkable.  Adrenals/Urinary Tract: Multiple low-attenuation lesions are noted in both kidneys, compatible with simple cysts, largest of which is in the medial aspect of the upper pole the right kidney measuring 3 cm. Mild multifocal cortical thinning in both kidneys. No hydroureteronephrosis. Urinary bladder is normal in appearance. Bilateral adrenal glands are normal in appearance.  Stomach/Bowel: Normal appearance of the stomach. No pathologic dilatation of small bowel or colon. The appendix is not confidently identified and may be surgically absent. Regardless, there are no inflammatory changes noted adjacent to the cecum to suggest the presence of an acute appendicitis at this time.  Vascular/Lymphatic: Aortic atherosclerosis, with vascular findings and measurements pertinent to potential TAVR procedure, as detailed below. No aneurysm or dissection noted in the abdominal or pelvic vasculature. No lymphadenopathy noted in the abdomen or pelvis.  Reproductive: Prostate gland and seminal vesicles are unremarkable in appearance.  Other: No significant volume of ascites.  No pneumoperitoneum.  Musculoskeletal: There are no aggressive appearing lytic or blastic lesions noted in the visualized portions of the skeleton.  VASCULAR MEASUREMENTS PERTINENT TO TAVR:  AORTA:  Minimal Aortic Diameter-16 x 16 mm  Severity of Aortic Calcification-mild-to-moderate  RIGHT PELVIS:  Right Common Iliac Artery -  Minimal Diameter-11.1 x 10.6 mm  Tortuosity-mild  Calcification-mild  Right External Iliac Artery -  Minimal Diameter-8.8 x 9.3  mm  Tortuosity-severe  Calcification-none  Right Common Femoral Artery -  Minimal Diameter-9.0 x 8.8 mm  Tortuosity-mild  Calcification-none  LEFT PELVIS:  Left Common Iliac Artery -  Minimal Diameter-8.2 x 10.4 mm  Tortuosity-severe  Calcification-mild  Left External Iliac Artery -  Minimal Diameter-9.1 x 9.0 mm  Tortuosity-severe  Calcification-none  Left Common Femoral Artery -  Minimal Diameter-8.5 x 9.0 mm  Tortuosity-mild  Calcification-none  Review of the MIP images confirms the above findings.  IMPRESSION: 1. Vascular findings and measurements pertinent to potential TAVR procedure, as detailed above. 2. Severe thickening calcification of the aortic valve, compatible with the reported clinical history of severe aortic stenosis. 3. Aortic atherosclerosis, in addition to left main and 3 vessel coronary artery disease. Status post median sternotomy for CABG including LIMA to the LAD. 4. Additional incidental findings, as above.   Electronically Signed   By: Trudie Reed M.D.   On: 03/27/2019 14:37   Impression:  Patient has stage D severe symptomatic aortic stenosis.  He describes a slow gradual progression of symptoms of exertional fatigue and shortness of breath consistent with chronic combined systolic and diastolic congestive heart failure, New York Heart Association functional class II.  I have personally reviewed the patient's recent transthoracic echocardiogram, diagnostic cardiac catheterization, and CT angiograms.  Echocardiogram demonstrates moderate global left ventricular systolic dysfunction with ejection fraction estimated 40 to 45%.  There are no significant wall motion abnormalities.  The aortic valve is trileaflet.  There is moderate thickening and restricted leaflet mobility involving all 3 of the leaflets of the aortic valve.  Peak velocity across aortic valve measured 3.7 m/s corresponding to mean  transvalvular gradient estimated 33 mmHg but aortic valve area was calculated only 0.90 cm and DVI notably quite low at 0.16.  Diagnostic  cardiac catheterization confirmed the presence of aortic stenosis and revealed severe native three-vessel coronary artery disease but continued patency of 3 of the 4 bypass grafts placed at the time of the patient's previous coronary artery bypass surgery in 2015.  There was chronic occlusion of the vein graft to the posterior descending coronary artery and high-grade stenosis of the distal right coronary artery.  The patient underwent successful PCI and stenting of the distal right coronary artery at that time.  Right heart pressures were normal.  I agree the patient would benefit from aortic valve replacement.  Risks associated with conventional surgical aortic valve replacement with or without redo coronary artery bypass grafting would be somewhat elevated because of the patient's previous coronary artery bypass surgery, significant left ventricular systolic dysfunction, and numerous other comorbid medical problems.  Cardiac-gated CTA of the heart reveals anatomical characteristics consistent with aortic stenosis suitable for treatment by transcatheter aortic valve replacement without any significant complicating features and CTA of the aorta and iliac vessels demonstrate what appears to be adequate pelvic vascular access to facilitate a transfemoral approach, although the patient's iliac vessels are somewhat tortuous.  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    Plan:  The patient was counseled at length regarding treatment alternatives for management of severe symptomatic aortic stenosis. Alternative approaches such as conventional aortic valve replacement, transcatheter aortic valve replacement, and continued medical therapy without intervention were compared and contrasted at length.  The risks associated with conventional surgical aortic valve replacement  were discussed in detail, as were expectations for post-operative convalescence, and why I would be reluctant to consider this patient a candidate for conventional surgery.  Issues specific to transcatheter aortic valve replacement were discussed including questions about long term valve durability, the potential for paravalvular leak, possible increased risk of need for permanent pacemaker placement, and other technical complications related to the procedure itself.  Long-term prognosis with medical therapy was discussed. This discussion was placed in the context of the patient's own specific clinical presentation and past medical history.  All of their questions have been addressed.  The patient desires to proceed with transcatheter aortic valve replacement as soon as practical.  We tentatively plan to proceed with surgery on April 08, 2019.  The patient has been instructed to stop taking warfarin after he takes his dose on Wednesday, April 02, 2019.  Following the decision to proceed with transcatheter aortic valve replacement, a discussion has been held regarding what types of management strategies would be attempted intraoperatively in the event of life-threatening complications, including whether or not the patient would be considered a candidate for the use of cardiopulmonary bypass and/or conversion to open sternotomy for attempted surgical intervention.  He understands that given his previous open heart surgery emergent median sternotomy would not likely be feasible should he develop intraoperative catastrophe with rapid cardiovascular collapse.  The patient has been advised of a variety of complications that might develop including but not limited to risks of death, stroke, paravalvular leak, aortic dissection or other major vascular complications, aortic annulus rupture, device embolization, cardiac rupture or perforation, mitral regurgitation, acute myocardial infarction, arrhythmia, heart block  or bradycardia requiring permanent pacemaker placement, congestive heart failure, respiratory failure, renal failure, pneumonia, infection, other late complications related to structural valve deterioration or migration, or other complications that might ultimately cause a temporary or permanent loss of functional independence or other long term morbidity.  The patient provides full informed consent for the procedure as described and all  questions were answered.     I spent in excess of 90 minutes during the conduct of this office consultation and >50% of this time involved direct face-to-face encounter with the patient for counseling and/or coordination of their care.    Salvatore Decent. Cornelius Moras, MD 03/31/2019 12:38 PM

## 2019-03-31 NOTE — Progress Notes (Signed)
°[facility]  °MULTIDISCIPLINARY HEART VALVE CLINIC ° °CARDIOTHORACIC SURGERY CONSULTATION REPORT ° °Referring Provider is Turner, Traci R, MD °PCP is Barnes, Elizabeth, MD ° °Chief Complaint  °Patient presents with  °• Aortic Stenosis  °  TAVR consultation  ° ° °HPI: ° °Patient is 78-year-old obese male with history of aortic stenosis, coronary artery disease status post coronary artery bypass grafting x4 in 2015, chronic combined systolic and diastolic congestive heart failure, symptomatic bradycardia and recurrent paroxysmal atrial fibrillation status post permanent pacemaker in the remote past on long-term warfarin anticoagulation, hypertension, hyperlipidemia, borderline type 2 diabetes mellitus, stage II chronic kidney disease, and obstructive sleep apnea who has been referred for surgical consultation to discuss treatment options for management of severe symptomatic aortic stenosis. ° °Patient's cardiac history dates back more than 20 years ago when he first developed paroxysmal atrial fibrillation and symptomatic tachycardia.  He underwent permanent pacemaker placement and has since then undergone generator change at end-of-life.  He has been on long-term warfarin anticoagulation for many years.  Interrogation of his pacemaker demonstrates that he remains in atrial paced rhythm much of the time with occasional episodes of paroxysmal atrial fibrillation and PVCs, none of which are symptomatic.  He developed coronary artery disease and initially was treated with PCI and stenting but later underwent coronary artery bypass grafting x4 in 2015.  Early following his surgery he was noted to have severe left ventricular systolic dysfunction with ejection fraction estimated only 20 to 25%.  This gradually improved over time.  He was noted to have aortic stenosis which was initially felt to be mild and has gradually progressed in severity.  Recent follow-up echocardiogram performed January 31, 2019  revealed stable left ventricular systolic function with ejection fraction estimated 45 to 50%.  There was severe aortic stenosis with peak velocity across aortic valve reported 3.7 m/s corresponding to mean transvalvular gradient of 33 mmHg but aortic valve area calculated only 0.90 cm² with DVI notably only 0.16.  The patient was referred to the multidisciplinary heart valve clinic and has been evaluated previously by Dr. McAlhany.  He underwent diagnostic cardiac catheterization on March 21, 2019 which revealed stable coronary anatomy with severe three-vessel coronary artery disease but continued patency of 3 out of the 4 previously placed bypass grafts including the left internal mammary artery graft to the distal left anterior descending coronary artery and saphenous vein grafts to the diagonal branch and the obtuse marginal branch of the left circumflex system.  There was chronic occlusion of the saphenous vein graft to the posterior descending coronary artery.  The patient was noted to have severe stenosis of distal right coronary artery and underwent successful PCI and stenting of the native distal right coronary artery at that time.  Peak to peak and mean transvalvular gradients across the aortic valve were measured 32 and 23.3 mmHg, respectively.  Pulmonary artery pressures were mildly elevated.  CT angiography was performed and the patient referred for surgical consultation. ° °Patient is married and lives locally in [place] with his wife.  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  He lives a fairly sedentary lifestyle.  He did participate in the cardiac rehab program religiously ever since his heart surgery in 2015 until this spring when the program was terminated because of COVID-19.  Over the past 6 months he has been more sedentary and does not exercise much at all on a   regular basis.  He admits to gradual  progression of symptoms of exertional fatigue and shortness of breath.  He denies resting shortness of breath, PND, orthopnea, or lower extremity edema.  He has not had any chest pain or chest tightness either with activity or at rest.  He has not had any dizzy spells or syncope.  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  He now walks using a cane for stability.  He has not had any mechanical falls. ° °Past Medical History:  °Diagnosis Date  °• AS (aortic stenosis)   ° moderate AS 2018  °• Carotid artery plaque   ° a. Duplex 10/2013: mild calcific plaque origin ICA. Left: intimal wall thickening CCA. 0-39% BICA.  °• Chronic renal insufficiency   °• Chronic systolic dysfunction of left ventricle   °• Coronary artery disease   ° a. s/p PCI of LAD 1997. b. Cutting balloon angioplasty to LCx in 2002. c. s/p cath 11/2013 with severe 3 vessel ASCAD s/p CABG with LIMA to LAD, SVG to diag, SVG to left circ and SVG to PDA. d. cath 07/07/2014 occluded SVG to PDA, all other grafts patent. 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  with benign gastric polypectomy 05/2014.  °• HLD (hyperlipidemia)   °• HTN (hypertension)   °• Hypokalemia   °• Hypomagnesemia   °• Impaired fasting glucose   ° A1C check every year  °• Obesity   °• OSA (obstructive sleep apnea)   °• Pacemaker   ° a. 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 b. MDT generator replacement 2012.  °• PAF (paroxysmal atrial fibrillation) (HCC)   ° on chronic systemic anticoagulation  °• PVC's (premature ventricular contractions)   °• S/P CABG x 4 12/01/2013  ° LIMA to LAD, SVG to Diag, SVG to OM, SVG to PDA  °• Syncope   ° a. vasovagal with documented bradycardia.  °• Type 2 diabetes mellitus without complications (HCC) 12/02/2015  ° ° °Past Surgical History:  °Procedure Laterality Date  °•  ANGIOPLASTY  1997  °• CHOLECYSTECTOMY    °• COLONOSCOPY WITH PROPOFOL N/A 05/05/2014  ° Procedure: COLONOSCOPY WITH PROPOFOL;  Surgeon: Martin K Johnson, MD;  Location: [facility];  Service: Endoscopy;  Laterality: N/A;  °• CORONARY ARTERY BYPASS GRAFT N/A 12/01/2013  ° Procedure: CORONARY ARTERY BYPASS GRAFT TIMES FOUR USING LEFT INTERNAL MAMMARY ARTERY TO LAD, SAPHENOUS VEIN GRAFTS TO DIAGONAL, CIRCUMFELX AND POSTERIOR DESCENDING;  Surgeon: Peter Van Trigt, MD;  Location: [facility] OR;  Service: Open Heart Surgery;  Laterality: N/A;  °• CORONARY STENT INTERVENTION N/A 03/21/2019  ° Procedure: CORONARY STENT INTERVENTION;  Surgeon: McAlhany, Christopher D, MD;  Location: [facility];  Service: Cardiovascular;  Laterality: N/A;  Distal RCA  °• ESOPHAGOGASTRODUODENOSCOPY (EGD) WITH PROPOFOL N/A 05/05/2014  ° Procedure: ESOPHAGOGASTRODUODENOSCOPY (EGD) WITH PROPOFOL;  Surgeon: Martin K Johnson, MD;  Location: [facility];  Service: Endoscopy;  Laterality: N/A;  °• KNEE ARTHROSCOPY  2000  ° Left  °• LEFT AND RIGHT HEART CATHETERIZATION WITH CORONARY/GRAFT ANGIOGRAM N/A 07/07/2014  ° Procedure: LEFT AND RIGHT HEART CATHETERIZATION WITH CORONARY/GRAFT ANGIOGRAM;  Surgeon: Christopher D McAlhany, MD;  Location: [facility];  Service: Cardiovascular;  Laterality: N/A;  °• LEFT HEART CATHETERIZATION WITH CORONARY ANGIOGRAM N/A 11/25/2013  ° Procedure: LEFT HEART CATHETERIZATION WITH   CORONARY ANGIOGRAM;  Surgeon: Traci R Turner, MD;  Location: [facility];  Service: Cardiovascular;  Laterality: N/A;  °• PACEMAKER INSERTION  2002  ° generator change MDT by Dr Klein in 2012  °• RIGHT/LEFT HEART CATH AND CORONARY/GRAFT ANGIOGRAPHY N/A 03/21/2019  ° Procedure: RIGHT/LEFT HEART CATH AND CORONARY/GRAFT ANGIOGRAPHY;  Surgeon: McAlhany, Christopher D, MD;  Location: [facility];  Service: Cardiovascular;  Laterality: N/A;  °• TEE WITHOUT CARDIOVERSION N/A 08/31/2014  ° Procedure: TRANSESOPHAGEAL ECHOCARDIOGRAM (TEE);  Surgeon:  Traci R Turner, MD;  Location: [facility];  Service: Cardiovascular;  Laterality: N/A;  ° ° °Family History  °Problem Relation Age of Onset  °• Colon cancer Mother   °     deceased  °• Heart attack Mother   °• Leukemia Father   °     deceased  °• Heart attack Brother   °     deceased  ° ° °Social History  ° °Socioeconomic History  °• Marital status: Married  °  Spouse name: Not on file  °• Number of children: Not on file  °• Years of education: Not on file  °• Highest education level: Not on file  °Occupational History  °• Not on file  °Social Needs  °• Financial resource strain: Not on file  °• Food insecurity  °  Worry: Not on file  °  Inability: Not on file  °• Transportation needs  °  Medical: Not on file  °  Non-medical: Not on file  °Tobacco Use  °• Smoking status: Former Smoker  °  Packs/day: 2.00  °  Years: 5.00  °  Pack years: 10.00  °  Types: Cigarettes  °  Quit date: 05/29/1966  °  Years since quitting: 52.8  °• Smokeless tobacco: Never Used  °Substance and Sexual Activity  °• Alcohol use: Yes  °  Alcohol/week: 0.0 standard drinks  °  Comment: ocassional  °• Drug use: No  °• Sexual activity: Not on file  °Lifestyle  °• Physical activity  °  Days per week: Not on file  °  Minutes per session: Not on file  °• Stress: Not on file  °Relationships  °• Social connections  °  Talks on phone: Not on file  °  Gets together: Not on file  °  Attends religious service: Not on file  °  Active member of club or organization: Not on file  °  Attends meetings of clubs or organizations: Not on file  °  Relationship status: Not on file  °• Intimate partner violence  °  Fear of current or ex partner: Not on file  °  Emotionally abused: Not on file  °  Physically abused: Not on file  °  Forced sexual activity: Not on file  °Other Topics Concern  °• Not on file  °Social History Narrative  ° Lives in [place] with spouse.  Retired salesman  ° ° °Current Outpatient Medications  °Medication Sig Dispense Refill  °•  acetaminophen (TYLENOL) 500 MG tablet Take 1,000 mg by mouth 3 (three) times daily.     °• aspirin EC 81 MG EC tablet Take 1 tablet (81 mg total) by mouth daily.    °• atorvastatin (LIPITOR) 80 MG tablet TAKE 1 TABLET BY MOUTH  DAILY (Patient taking differently: Take 80 mg by mouth daily at 6 PM. ) 90 tablet 2  °• carvedilol (COREG) 25 MG tablet TAKE 1 TABLET(25 MG) BY MOUTH TWICE DAILY WITH A MEAL (Patient taking   differently: Take 25 mg by mouth 2 (two) times daily with a meal. ) 180 tablet 2  °• clopidogrel (PLAVIX) 75 MG tablet Take 1 tablet (75 mg total) by mouth daily with breakfast. 90 tablet 3  °• docusate sodium (COLACE) 100 MG capsule Take 200 mg by mouth daily.     °• fexofenadine (ALLEGRA) 180 MG tablet Take 180 mg by mouth daily.     °• fluticasone (FLONASE) 50 MCG/ACT nasal spray Place 2 sprays into the nose daily.     °• furosemide (LASIX) 20 MG tablet TAKE 1 TABLET BY MOUTH TWICE DAILY. WITH 80 MG TO TOTAL 100MG (Patient taking differently: Take 20 mg by mouth 2 (two) times daily. TAKE 20 mg TABLET BY MOUTH TWICE DAILY. WITH 80 MG TO TOTAL 100MG) 180 tablet 2  °• furosemide (LASIX) 80 MG tablet TAKE 1 TABLET BY MOUTH TWICE DAILY. TAKE WITH 20 MG TABLET (Patient taking differently: Take 80 mg by mouth 2 (two) times daily. TAKE 80 mg TABLET BY MOUTH TWICE DAILY. TAKE WITH 20 MG table for a total of 100 mg) 180 tablet 2  °• Glucosamine-Chondroit-Vit C-Mn (GLUCOSAMINE CHONDR 1500 COMPLX) CAPS Take 1 capsule by mouth every morning.     °• hydrALAZINE (APRESOLINE) 25 MG tablet TAKE 1 TABLET(25 MG) BY MOUTH THREE TIMES DAILY (Patient taking differently: Take 25 mg by mouth 3 (three) times daily. ) 270 tablet 2  °• Icosapent Ethyl (VASCEPA) 1 g CAPS Take 2 capsules (2 g total) by mouth 2 (two) times daily. 360 capsule 4  °• isosorbide mononitrate (IMDUR) 30 MG 24 hr tablet TAKE 1 TABLET BY MOUTH EVERY DAY (Patient taking differently: Take 30 mg by mouth daily. ) 90 tablet 2  °• L-Lysine 1000 MG TABS Take  1,000 mg by mouth daily.     °• meclizine (ANTIVERT) 25 MG tablet Take 25 mg by mouth daily as needed for dizziness.     °• Multiple Vitamin (MULTIVITAMIN) tablet Take 1 tablet by mouth daily.     °• nitroGLYCERIN (NITROSTAT) 0.4 MG SL tablet Place 1 tablet (0.4 mg total) under the tongue every 5 (five) minutes as needed for chest pain. 30 tablet 3  °• ONE TOUCH ULTRA TEST test strip 1 each by Other route every morning.     °• ONETOUCH DELICA LANCETS FINE MISC 1 each by Other route every morning.     °• pantoprazole (PROTONIX) 40 MG tablet Take 40 mg by mouth daily.     °• potassium chloride SA (K-DUR) 20 MEQ tablet TAKE 2 TABLETS BY MOUTH TWICE DAILY (Patient taking differently: Take 40 mEq by mouth 2 (two) times daily. ) 360 tablet 2  °• RA NATURAL MAGNESIUM 250 MG TABS TAKE 2 TABLETS TWICE DAILY. (Patient taking differently: Take 500 mg by mouth 2 (two) times daily. ) 120 tablet 5  °• spironolactone (ALDACTONE) 25 MG tablet TAKE 1 TABLET(25 MG) BY MOUTH DAILY (Patient taking differently: Take 25 mg by mouth daily. ) 90 tablet 2  °• warfarin (COUMADIN) 5 MG tablet Take 1 to 1.5 tablets daily as directed by Coumadin clinic (Patient taking differently: Take 5-7.5 mg by mouth at bedtime. Take 5 mg on Tuesday and Thursday °Take 7.5 mg all the other days) 135 tablet 1  °• Omega-3 Fatty Acids (FISH OIL) 1000 MG CPDR Take 2,000 mg by mouth 2 (two) times daily.     °• PRESCRIPTION MEDICATION Place 1 drop into both eyes daily. Compounded eye drop ° °bromfenac ophthalmic solution 0.75% °  Gatifloxacin °prednisolone acetate 1 % eye drops,suspension    ° °No current facility-administered medications for this visit.   ° ° °Allergies  °Allergen Reactions  °• Acetaminophen-Codeine Other (See Comments)  °  Other reaction(s): Other (See Comments) °Extreme constipation °Extreme constipation °  °• Amoxicillin Itching and Rash  °  Unknown-maybe a rash?  °• Diovan [Valsartan] Other (See Comments)  °  Unknown   ° ° ° ° °Review of  Systems: ° ° General:  normal appetite, decreased energy, no weight gain, no weight loss, no fever ° Cardiac:  no chest pain with exertion, no chest pain at rest, +SOB with exertion, no resting SOB, no PND, no orthopnea, no palpitations, no arrhythmia, no atrial fibrillation, no LE edema, no dizzy spells, no syncope ° Respiratory:  + exertional shortness of breath, no home oxygen, no productive cough, no dry cough, no bronchitis, no wheezing, no hemoptysis, no asthma, no pain with inspiration or cough, + sleep apnea, + CPAP at night ° GI:   no difficulty swallowing, no reflux, no frequent heartburn, no hiatal hernia, no abdominal pain, + constipation, no diarrhea, no hematochezia, no hematemesis, no melena ° GU:   no dysuria,  no frequency, no urinary tract infection, no hematuria, no enlarged prostate, no kidney stones, no kidney disease ° Vascular:  no pain suggestive of claudication, no pain in feet, no leg cramps, no varicose veins, no DVT, no non-healing foot ulcer ° Neuro:   no stroke, no TIA's, no seizures, no headaches, no temporary blindness one eye,  no slurred speech, no peripheral neuropathy, no chronic pain, mild instability of gait, no memory/cognitive dysfunction ° Musculoskeletal: + arthritis, + joint swelling, no myalgias, mild difficulty walking, somewhat reduced mobility  ° Skin:   no rash, no itching, no skin infections, no pressure sores or ulcerations ° Psych:   no anxiety, no depression, no nervousness, no unusual recent stress ° Eyes:   no blurry vision, no floaters, + recent vision changes - improved after bilateral cataract surgery, + wears glasses for reading ° ENT:   no hearing loss, no loose or painful teeth, no dentures, last saw dentist June 2020 ° Hematologic:  + easy bruising, no abnormal bleeding, no clotting disorder, no frequent epistaxis ° Endocrine:  no diabetes, does not check CBG's at home ° °  °  ° °  °  °Physical Exam: ° ° BP 134/81 (BP Location: Right Arm)    Pulse 75     Temp 97.7 °F (36.5 °C) (Skin)    Resp 20    Ht 5' 7" (1.702 m)    Wt 246 lb (111.6 kg)    SpO2 95% Comment: RA   BMI 38.53 kg/m²  ° General:  Obese,  well-appearing ° HEENT:  Unremarkable  ° Neck:   no JVD, no bruits, no adenopathy  ° Chest:   clear to auscultation, symmetrical breath sounds, no wheezes, no rhonchi  ° CV:   RRR, grade II/VI crescendo/decrescendo murmur heard best at LSB,  no diastolic murmur ° Abdomen:  soft, non-tender, no masses  ° Extremities:  warm, well-perfused, pulses diminished, no LE edema ° Rectal/GU  Deferred ° Neuro:   Grossly non-focal and symmetrical throughout ° Skin:   Clean and dry, no rashes, no breakdown ° ° °Diagnostic Tests: ° °ECHOCARDIOGRAM REPORT   °  °  °  °Patient Name:   Brandon Klein Date of Exam: 01/31/2019 °Medical Rec #:  [id number]     Height:         67.0 in °Accession #:    [id number]    Weight:       241.1 lb °Date of Birth:  12/25/1940     BSA:          2.19 m² °Patient Age:    78 years      BP:           122/66 mmHg °Patient Gender: M             HR:           85 bpm. °Exam Location:  [address] °  °  °Procedure: 2D Echo, Cardiac Doppler and Color Doppler °  °Indications:    I35.0 °  °History:        Patient has prior history of Echocardiogram examinations, most °                recent 02/12/2018. Non ischemic cardiomyopathy CAD Prior CABG and °                Pacemaker Atrial Fibrillation and PVC AS Risk Factors: Former °                Smoker, Hypertension, Dyslipidemia and Diabetes. °  °Sonographer:    William Edwards RDCS °Referring Phys: [id number] MICHELE M LENZE °  °IMPRESSIONS °  °  ° 1. The left ventricle has mildly reduced systolic function, with an ejection fraction of 45-50%. The cavity size was normal. There is moderate concentric left ventricular hypertrophy. Left ventricular diastolic Doppler parameters are consistent with  °impaired relaxation. There is abnormal septal motion consistent with RV pacemaker. No evidence of left ventricular regional wall motion  abnormalities. ° 2. The right ventricle has normal systolic function. The cavity was normal. There is no increase in right ventricular wall thickness. Right ventricular systolic pressure is normal with an estimated pressure of 26.6 mmHg. ° 3. Left atrial size was mildly dilated. ° 4. Mild thickening of the mitral valve leaflet. ° 5. The aortic valve is tricuspid. Moderate thickening of the aortic valve. Moderate calcification of the aortic valve. Moderate-severe stenosis of the aortic valve. ° 6. The aorta is normal unless otherwise noted. ° 7. When compared to the prior study: 02/12/2018, aortic valve gradients have worsened. The dimensionless obstructive index and the AV area calculation exaggerates the reduction in valve area due to erroneous location of pulsed Doppler sample volume  °(which underestimates the true stroke volume). Nevertheless, the degree of aortic stenosis is approaching the severe range. °  °FINDINGS ° Left Ventricle: The left ventricle has mildly reduced systolic function, with an ejection fraction of 45-50%. The cavity size was normal. There is moderate concentric left ventricular hypertrophy. Left ventricular diastolic Doppler parameters are  °consistent with impaired relaxation. Normal left ventricular filling pressures There is abnormal (paradoxical) septal motion, consistent with RV pacemaker. No evidence of left ventricular regional wall motion abnormalities.. Septal-lateral dyssynchrony. °  °Right Ventricle: The right ventricle has normal systolic function. The cavity was normal. There is no increase in right ventricular wall thickness. Right ventricular systolic pressure is normal with an estimated pressure of 26.6 mmHg. Pacing  °wire/catheter visualized in the right ventricle. °  °Left Atrium: Left atrial size was mildly dilated. °  °Right Atrium: Right atrial size was normal in size. Right atrial pressure is estimated at 10 mmHg. pacemaker lead is seen.. Pacemaker lead is  seen. °  °Interatrial Septum: No atrial level shunt detected by color flow Doppler. °  °Pericardium: There is no evidence of pericardial effusion. °  °Mitral Valve: The mitral valve is   normal in structure. Mild thickening of the mitral valve leaflet. Mitral valve regurgitation is trivial by color flow Doppler. °  °Tricuspid Valve: The tricuspid valve is normal in structure. Tricuspid valve regurgitation is mild by color flow Doppler. °  °Aortic Valve: The aortic valve is tricuspid Moderate thickening of the aortic valve, with moderately decreased cusp excursion. Moderate calcification of the aortic valve. Aortic valve regurgitation was not visualized by color flow Doppler. There is  °Moderate-severe stenosis of the aortic valve, with a calculated valve area of 0.85 cm². °  °Pulmonic Valve: The pulmonic valve was grossly normal. Pulmonic valve regurgitation is not visualized by color flow Doppler. °  °Aorta: The aorta is normal unless otherwise noted. °  °Venous: The inferior vena cava measures 1.00 cm, is normal in size with greater than 50% respiratory variability. °  °Compared to previous exam: 02/12/2018, aortic valve gradients have worsened. The dimensionless obstructive index and the AV area calculation exaggerates the reduction in valve area due to erroneous location of pulsed Doppler sample volume (which  °underestimates the true stroke volume). Nevertheless, the degree of aortic stenosis is approaching the severe range. °  °  °+--------------+--------++ ° LEFT VENTRICLE            +----------------+---------++ °+--------------+--------++  Diastology                   ° PLAX 2D                   +----------------+---------++ °+--------------+--------++  LV e' lateral:   6.74 cm/s   ° LVIDd:         5.55 cm    +----------------+---------++ °+--------------+--------++  LV E/e' lateral: 5.2         ° LVIDs:         4.65 cm    +----------------+---------++ °+--------------+--------++  LV e' medial:    4.90  cm/s   ° LV PW:         1.40 cm    +----------------+---------++ °+--------------+--------++  LV E/e' medial:  7.2         ° LV IVS:        1.50 cm    +----------------+---------++ °+--------------+--------++ ° LVOT diam:     2.61 cm    °+--------------+--------++ ° LV SV:         51 ml      °+--------------+--------++ ° LV SV Index:   21.83      °+--------------+--------++ ° LVOT Area:     5.37 cm²   °+--------------+--------++ °                           °+--------------+--------++ °  °+---------------+---------++ ° RIGHT VENTRICLE             °+---------------+---------++ ° RV S prime:     5.77 cm/s   °+---------------+---------++ ° TAPSE (M-mode): 0.7 cm      °+---------------+---------++ ° RVSP:           19.6 mmHg   °+---------------+---------++ °  °+---------------+-------++-----------++ ° LEFT ATRIUM              Index         °+---------------+-------++-----------++ ° LA diam:        4.30 cm  1.96 cm/m²    °+---------------+-------++-----------++ ° LA Vol (A2C):   83.1 ml  37.95 ml/m²   °+---------------+-------++-----------++ ° LA Vol (A4C):   55.1 ml  25.17 ml/m²   °+---------------+-------++-----------++ ° LA Biplane Vol: 68.9 ml  31.47 ml/m²   °+---------------+-------++-----------++ °+------------+---------++-----------++ °   RIGHT ATRIUM            Index         °+------------+---------++-----------++ ° RA Pressure: 3.00 mmHg                °+------------+---------++-----------++ ° RA Area:     21.90 cm²                °+------------+---------++-----------++ ° RA Volume:   66.50 ml   30.37 ml/m²   °+------------+---------++-----------++ ° +------------------+------------++ ° AORTIC VALVE                      °+------------------+------------++ ° AV Area (Vmax):    0.90 cm²       °+------------------+------------++ ° AV Area (Vmean):   0.90 cm²       °+------------------+------------++ ° AV Area (VTI):     0.85 cm²       °+------------------+------------++ ° AV Vmax:           368.67 cm/s     °+------------------+------------++ ° AV Vmean:          269.000 cm/s   °+------------------+------------++ ° AV VTI:            0.843 m        °+------------------+------------++ ° AV Peak Grad:      54.4 mmHg      °+------------------+------------++ ° AV Mean Grad:      33.0 mmHg      °+------------------+------------++ ° LVOT Vmax:         61.51 cm/s     °+------------------+------------++ ° LVOT Vmean:        45.316 cm/s    °+------------------+------------++ ° LVOT VTI:          0.133 m        °+------------------+------------++ ° LVOT/AV VTI ratio: 0.16           °+------------------+------------++ °  °+------------+-------++ ° AORTA                  °+------------+-------++ ° Ao Asc diam: 3.60 cm   °+------------+-------++ °  °+--------------+--------++   +---------------+-----------++ ° MITRAL VALVE                 TRICUSPID VALVE               °+--------------+--------++   +---------------+-----------++ ° MV Area (PHT):               TR Peak grad:   16.6 mmHg     °+--------------+--------++   +---------------+-----------++ °                              TR Vmax:        208.00 cm/s   °+--------------+--------++   +---------------+-----------++ ° MV Decel Time: 240 msec      Estimated RAP:  3.00 mmHg     °+--------------+--------++   +---------------+-----------++ °+--------------+----------++  RVSP:           19.6 mmHg     ° MV E velocity: 35.20 cm/s   +---------------+-----------++ °+--------------+----------++ ° MV A velocity: 70.40 cm/s   +--------------+-------+ °+--------------+----------++  SHUNTS                  ° MV E/A ratio:  0.50         +--------------+-------+ °+--------------+----------++  Systemic VTI:  0.13 m   °                             +--------------+-------+ °                                Systemic Diam: 2.61 cm  °                             +--------------+-------+ °  °+---------+-------+ ° IVC                °+---------+-------+ ° IVC diam: 1.00 cm  °+---------+-------+ °  °   °Mihai Croitoru MD °Electronically signed by Mihai Croitoru MD °Signature Date/Time: 01/31/2019/5:54:28 PM ° ° ° ° °CORONARY STENT INTERVENTION  °RIGHT/LEFT HEART CATH AND CORONARY/GRAFT ANGIOGRAPHY  °Conclusion ° °  °· Mid RCA lesion is 30% stenosed. °· Dist RCA-1 lesion is 90% stenosed. °· Dist RCA-2 lesion is 90% stenosed. °· SVG graft was not injected. °· Origin to Prox Graft lesion is 100% stenosed. °· Ramus lesion is 80% stenosed. °· Mid Cx lesion is 100% stenosed. °· SVG graft was visualized by angiography. °· SVG graft was visualized by angiography. °· Mid LAD lesion is 100% stenosed. °· A drug-eluting stent was successfully placed using a STENT SYNERGY DES 2.75X32. °· Post intervention, there is a 40% residual stenosis. °· Post intervention, there is a 0% residual stenosis. °  °1. 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 Known occlusion SVG to PDA. Severe stenosis distal RCA.  °3. Successful PTCA/DES x 1 distal RCA.  °4. Chronic occlusion mid LAD. Patent LIMA to mid LAD. Patent SVG to Diagonal °5. Chronic occlusion mid Circumflex. Patent SVG to OM.  °6. Severe disease in the small to moderate caliber intermediate branch °7. Severe aortic stenosis  By echo. Cath data with mean gradient 23.3 mmHg, peak to peak gradient 32 mm Hg).  °  °Will continue planning for TAVR. Will continue ASA, Plavix and coumadin for now. Will continue DAPT with ASA and Plavix until after his TAVR procedure.  °   °Recommendations ° °Antiplatelet/Anticoag Will continue ASA, Plavix and coumadin for now. Will continue DAPT with ASA and Plavix until after his TAVR procedure.  °Indications ° °Unstable angina (HCC) [I20.0 (ICD-10-CM)]  °Procedural Details ° °Technical Details Indication: Severe AS and dyspnea concerning for angina. Pre-TAVR cardiac cath.  ° °Procedure: The risks, benefits, complications, treatment options, and expected outcomes were discussed with the patient. The patient and/or family  concurred with the proposed plan, giving informed consent. The patient was brought to the cath lab after IV hydration was given. The patient was sedated with Versed and Fentanyl. The IV catheter in the right antecubital vein was prepped and draped. I changed out this catheter for a 5 French sheath. Right heart catheterization performed with a balloon tipped catheter. The left wrist was prepped and draped in a sterile fashion. 1% lidocaine was used for local anesthesia. Using the modified Seldinger access technique, a 5 French sheath was placed in the left radial artery. 3 mg Verapamil was given through the sheath. 5500 units IV heparin was given. Standard diagnostic catheters were used to perform selective coronary angiography. I crossed the aortic valve with an AL-2 and a straight wire. LV pressures measured.  ° °PCI Note: The RCA was engaged with a JR4 guiding catheter. IV heparin used for anti-coagulation. ACT over 290. Cougar IC wire passed down the RCA. A 2.0 x 15 mm balloon was used to dilate the distal lesions. Calcification noted but felt to be moderate. I then deployed a 2.75 x 32 mm synergy DES in the distal RCA. The stent was post-dilated with a 3.25 mm [place] balloon. The more proximal lesion would not   fully expand due to vessel calcification. I tried multiple short Gering balloons at high pressure.  ° ° °The sheath was removed from the right radial artery and a Terumo hemostasis band was applied at the arteriotomy site on the right wrist.  ° ° °Estimated blood loss <50 mL.  ° °During this procedure medications were administered to achieve and maintain moderate conscious sedation while the patient's heart rate, blood pressure, and oxygen saturation were continuously monitored and I was present face-to-face 100% of this time.  °Medications °(Filter: Administrations occurring from 03/21/19 1100 to 03/21/19 1252) °(important)  Continuous medications are totaled by the amount administered until 03/21/19 1252.   °Medication Rate/Dose/Volume Action  Date Time   °fentaNYL (SUBLIMAZE) injection (mcg) 25 mcg Given 03/21/19 1118   °Total dose as of 03/21/19 1252 25 mcg Given 1225   °50 mcg        °midazolam (VERSED) injection (mg) 1 mg Given 03/21/19 1118   °Total dose as of 03/21/19 1252 1 mg Given 1225   °2 mg        °lidocaine (PF) (XYLOCAINE) 1 % injection (mL) 2 mL Given 03/21/19 1121   °Total dose as of 03/21/19 1252 2 mL Given 1128   °4 mL        °heparin injection (Units) 5,500 Units Given 03/21/19 1135   °Total dose as of 03/21/19 1252 6,000 Units Given 1150   °14,500 Units 3,000 Units Given 1206   °clopidogrel (PLAVIX) tablet (mg) 600 mg Given 03/21/19 1203   °Total dose as of 03/21/19 1252        °600 mg        °famotidine (PEPCID) IVPB 20 mg premix (mg) 20 mg New Bag/Given 03/21/19 1209   °Total dose as of 03/21/19 1252        °Cannot be calculated        °iohexol (OMNIPAQUE) 350 MG/ML injection (mL) 145 mL Given 03/21/19 1245   °Total dose as of 03/21/19 1252        °145 mL        °Heparin (Porcine) in NaCl 1000-0.9 UT/500ML-% SOLN (mL) 500 mL Given 03/21/19 1245   °Total dose as of 03/21/19 1252 500 mL Given 1245   °1,000 mL        °Radial Cocktail/Verapamil only (mL) 10 mL Given 03/21/19 1130   °Total dose as of 03/21/19 1252        °10 mL        °Sedation Time ° °Sedation Time Physician-1: 1 hour 12 minutes 22 seconds  °Contrast ° °Medication Name Total Dose  °iohexol (OMNIPAQUE) 350 MG/ML injection 145 mL  °  °Radiation/Fluoro ° °Fluoro time: 17.7 (min) °DAP: 143716 (mGycm2) °Cumulative Air Kerma: 3791 (mGy)  °Complications ° °Complications documented before study signed (03/24/2019  7:00 AM) °  °  °Log Level Complications ° °None Documented by McAlhany, Christopher D, MD 03/21/2019 12:56 PM  °Date Found: 03/21/2019  °Time Range: Intraprocedure  °  °Coronary Findings ° °Diagnostic °Dominance: Right °Left Anterior Descending  °Vessel is large.  °Mid LAD lesion 100% stenosed  °Mid LAD lesion is 100% stenosed. The  lesion is chronically occluded.  °Second Diagonal Branch  °Vessel is moderate in size.  °Ramus Intermedius  °Vessel is small.  °Ramus lesion 80% stenosed  °Ramus lesion is 80% stenosed.  °Left Circumflex  °Vessel is large.  °Mid Cx lesion 100% stenosed  °Mid Cx lesion is 100% stenosed. The lesion is chronically occluded.  °Right Coronary Artery  °Vessel   is large.  °Mid RCA lesion 30% stenosed  °Mid RCA lesion is 30% stenosed.  °Dist RCA-1 lesion 90% stenosed  °Dist RCA-1 lesion is 90% stenosed. The lesion is calcified.  °Dist RCA-2 lesion 90% stenosed  °Dist RCA-2 lesion is 90% stenosed. The lesion is calcified.  °saphenous Graft to RPAV  °SVG graft was not injected.  °Origin to Prox Graft lesion 100% stenosed  °Origin to Prox Graft lesion is 100% stenosed. The lesion is chronically occluded.  °saphenous Graft to 3rd Mrg  °SVG graft was visualized by angiography.  °saphenous Graft to 2nd Diag  °SVG graft was visualized by angiography.  °LIMA Graft to Mid LAD  °Intervention ° °Dist RCA-1 lesion  °Stent (Also treats lesions: Dist RCA-2)  °CATH [place]ISTA GUIDE 6FR JR4 guide catheter was inserted. Pre-stent angioplasty was performed using a BALLOON EMERGE MR 2.0X15. A drug-eluting stent was successfully placed using a STENT SYNERGY DES 2.75X32. Stent strut is well apposed. Post-stent angioplasty was performed using a BALLOON SAPPHIRE [place] 3.25X12.  °Post-Intervention Lesion Assessment  °The intervention was successful. Pre-interventional TIMI flow is 3. Post-intervention TIMI flow is 3. No complications occurred at this lesion.  °There is a 40% residual stenosis post intervention.  °Dist RCA-2 lesion  °Stent (Also treats lesions: Dist RCA-1)  °CATH VISTA GUIDE 6FR JR4 guide catheter was inserted. Pre-stent angioplasty was performed using a BALLOON EMERGE MR 2.0X15. A drug-eluting stent was successfully placed using a STENT SYNERGY DES 2.75X32. Stent strut is well apposed. Post-stent angioplasty was performed using a BALLOON  SAPPHIRE [place] 3.25X12.  °Post-Intervention Lesion Assessment  °The intervention was successful. Pre-interventional TIMI flow is 3. Post-intervention TIMI flow is 3. No complications occurred at this lesion.  °There is a 0% residual stenosis post intervention.  °Coronary Diagrams ° °Diagnostic °Dominance: Right ° °Intervention ° ° °Implants    °Permanent Stent  °Stent Synergy Des 2.75x32 - [id number] - Implanted ° °Inventory item: STENT SYNERGY DES 2.75X32 Model/Cat number: [id number]  °Manufacturer: [employer] Lot number: [id number]  °Device identifier: [id number] Device identifier type: GS1  °GUDID Information ° °Request status Successful    °Brand name: SYNERGY™ Version/Model: [id number]  °Company name: [employer] MRI safety info as of 03/21/19: MR Conditional  °Contains dry or latex rubber: No    °GMDN P.T. name: Drug-eluting coronary artery stent, bioabsorbable-polymer-coated    °As of 03/21/2019 ° °Status: Implanted    °  °Syngo Images ° ° Show images for CARDIAC CATHETERIZATION  °Images on Long Term Storage ° ° Show images for Eades, Jens F  ° Link to Procedure Log ° °Procedure Log  °  °Hemo Data ° ° Most Recent Value  °Fick Cardiac Output 5.49 L/min  °Fick Cardiac Output Index 2.53 (L/min)/BSA  °Aortic Mean Gradient 23.26 mmHg  °Aortic Peak Gradient 32 mmHg  °Aortic Valve Area 1.35  °Aortic Value Area Index 0.62 cm2/BSA  °RA A Wave 8 mmHg  °RA V Wave 9 mmHg  °RA Mean 7 mmHg  °RV Systolic Pressure 23 mmHg  °RV Diastolic Pressure 2 mmHg  °RV EDP 6 mmHg  °PA Systolic Pressure 22 mmHg  °PA Diastolic Pressure 0 mmHg  °PA Mean 15 mmHg  °PW A Wave 8 mmHg  °PW V Wave 7 mmHg  °PW Mean 6 mmHg  °AO Systolic Pressure 107 mmHg  °AO Diastolic Pressure 60 mmHg  °AO Mean 79 mmHg  °LV Systolic Pressure 151 mmHg  °LV Diastolic Pressure 7 mmHg  °LV EDP 17 mmHg  °AOp Systolic Pressure 119 mmHg  °AOp Diastolic   Pressure 60 mmHg  °AOp Mean Pressure 84 mmHg  °LVp Systolic Pressure 150 mmHg  °LVp  Diastolic Pressure 6 mmHg  °LVp EDP Pressure 16 mmHg  °QP/QS 1  °TPVR Index 5.92 HRUI  °TSVR Index 31.21 HRUI  °PVR SVR Ratio 0.13  °TPVR/TSVR Ratio 0.19  ° ° ° °Cardiac TAVR CT °  °TECHNIQUE: °The patient was scanned on a Phillips Force scanner. A 120 kV °retrospective scan was triggered in the descending thoracic aorta at °111 HU's. Gantry rotation speed was 250 msecs and collimation was .6 °mm. No beta blockade or nitro were given. The 3D data set was °reconstructed in 5% intervals of the R-R cycle. Systolic and °diastolic phases were analyzed on a dedicated work station using °MPR, MIP and VRT modes. The patient received 80 cc of contrast. °  °FINDINGS: °Aortic Valve: Trileaflet aortic valve with severely thickened and °calcified leaflets and severely restricted leaflet openings. Only °minimal calcifications are extending into the LVOT under the °non-coronary leaflet. °  °Aorta: Normal size with minimal atherosclerotic plaque and °calcifications and no dissection. °  °Sinotubular Junction: 30 x 28 mm °  °Ascending Thoracic Aorta: 34 x 34 mm °  °Aortic Arch: 28 x 26 mm °  °Descending Thoracic Aorta: 25 x 24 mm °  °Sinus of Valsalva Measurements: Sinus are relatively shallow for °annular size. °  °Non-coronary: 33 mm °  °Right -coronary: 33 mm °  °Left -coronary: 33 mm °  °Coronary Artery Height above Annulus: °  °Left Main: 12 mm °  °Right Coronary: 16 mm °  °Virtual Basal Annulus Measurements: °  °Maximum/Minimum Diameter: 29.3 x 26.2 mm °  °Mean Diameter: 27.7 mm °  °Perimeter: 89.5 mm °  °Area: 604 mm2 °  °Optimum Fluoroscopic Angle for Delivery: LAO 4 CAU 3 °  °IMPRESSION: °1. Trileaflet aortic valve with severely thickened and calcified °leaflets and severely restricted leaflet openings. Only minimal °calcifications are extending into the LVOT under the non-coronary °leaflet. Aortic valve calcium score 2974 consistent with severe °aortic stenosis. Annular measurements suitable for delivery of a 29 °mm  Edwards-SAPIEN 3 valve. °  °2. Sufficient coronary to annulus distance. °  °3. Optimum Fluoroscopic Angle for Delivery: LAO 4 CAU 3. °  °4. No thrombus in the left atrial appendage. °  °  °Electronically Signed °  By: Katarina  Nelson °  On: 03/27/2019 14:21 °   °  ° ° ° °CT ANGIOGRAPHY CHEST, ABDOMEN AND PELVIS °  °TECHNIQUE: °Multidetector CT imaging through the chest, abdomen and pelvis was °performed using the standard protocol during bolus administration of °intravenous contrast. Multiplanar reconstructed images and MIPs were °obtained and reviewed to evaluate the vascular anatomy. °  °CONTRAST:  100mL OMNIPAQUE IOHEXOL 350 MG/ML SOLN °  °COMPARISON:  Chest CTA 04/03/2014. °  °FINDINGS: °CTA CHEST FINDINGS °  °Cardiovascular: Heart size is borderline enlarged. There is no °significant pericardial fluid, thickening or pericardial °calcification. There is aortic atherosclerosis, as well as °atherosclerosis of the great vessels of the mediastinum and the °coronary arteries, including calcified atherosclerotic plaque in the °left main, left anterior descending, left circumflex and right °coronary arteries. Status post median sternotomy for CABG including °LIMA to the LAD. Severe thickening calcification of the aortic °valve. Left-sided pacemaker device in place with lead tips °terminating in the right atrium and right ventricular apex. °  °Mediastinum/Lymph Nodes: No pathologically enlarged mediastinal or °hilar lymph nodes. Please note that accurate exclusion of hilar °adenopathy is limited on noncontrast CT scans. Esophagus is °unremarkable in appearance. No axillary lymphadenopathy. °  °Lungs/Pleura: No suspicious appearing pulmonary nodules or masses °are noted. No acute consolidative airspace disease. No pleural °effusions. °  °Musculoskeletal/Soft Tissues: Median sternotomy wires. There are   no °aggressive appearing lytic or blastic lesions noted in the °visualized portions of the skeleton. °  °CTA ABDOMEN AND  PELVIS FINDINGS °  °Hepatobiliary: No suspicious cystic or solid hepatic lesions. No °intra or extrahepatic biliary ductal dilatation. Status post °cholecystectomy. °  °Pancreas: No pancreatic mass. No pancreatic ductal dilatation. No °pancreatic or peripancreatic fluid collections or inflammatory °changes. °  °Spleen: Unremarkable. °  °Adrenals/Urinary Tract: Multiple low-attenuation lesions are noted °in both kidneys, compatible with simple cysts, largest of which is °in the medial aspect of the upper pole the right kidney measuring 3 °cm. Mild multifocal cortical thinning in both kidneys. No °hydroureteronephrosis. Urinary bladder is normal in appearance. °Bilateral adrenal glands are normal in appearance. °  °Stomach/Bowel: Normal appearance of the stomach. No pathologic °dilatation of small bowel or colon. The appendix is not confidently °identified and may be surgically absent. Regardless, there are no °inflammatory changes noted adjacent to the cecum to suggest the °presence of an acute appendicitis at this time. °  °Vascular/Lymphatic: Aortic atherosclerosis, with vascular findings °and measurements pertinent to potential TAVR procedure, as detailed °below. No aneurysm or dissection noted in the abdominal or pelvic °vasculature. No lymphadenopathy noted in the abdomen or pelvis. °  °Reproductive: Prostate gland and seminal vesicles are unremarkable °in appearance. °  °Other: No significant volume of ascites.  No pneumoperitoneum. °  °Musculoskeletal: There are no aggressive appearing lytic or blastic °lesions noted in the visualized portions of the skeleton. °  °VASCULAR MEASUREMENTS PERTINENT TO TAVR: °  °AORTA: °  °Minimal Aortic Diameter-16 x 16 mm °  °Severity of Aortic Calcification-mild-to-moderate °  °RIGHT PELVIS: °  °Right Common Iliac Artery - °  °Minimal Diameter-11.1 x 10.6 mm °  °Tortuosity-mild °  °Calcification-mild °  °Right External Iliac Artery - °  °Minimal Diameter-8.8 x 9.3  mm °  °Tortuosity-severe °  °Calcification-none °  °Right Common Femoral Artery - °  °Minimal Diameter-9.0 x 8.8 mm °  °Tortuosity-mild °  °Calcification-none °  °LEFT PELVIS: °  °Left Common Iliac Artery - °  °Minimal Diameter-8.2 x 10.4 mm °  °Tortuosity-severe °  °Calcification-mild °  °Left External Iliac Artery - °  °Minimal Diameter-9.1 x 9.0 mm °  °Tortuosity-severe °  °Calcification-none °  °Left Common Femoral Artery - °  °Minimal Diameter-8.5 x 9.0 mm °  °Tortuosity-mild °  °Calcification-none °  °Review of the MIP images confirms the above findings. °  °IMPRESSION: °1. Vascular findings and measurements pertinent to potential TAVR °procedure, as detailed above. °2. Severe thickening calcification of the aortic valve, compatible °with the reported clinical history of severe aortic stenosis. °3. Aortic atherosclerosis, in addition to left main and 3 vessel °coronary artery disease. Status post median sternotomy for CABG °including LIMA to the LAD. °4. Additional incidental findings, as above. °  °  °Electronically Signed °  By: Daniel  Entrikin M.D. °  On: 03/27/2019 14:37 ° ° °Impression: ° °Patient has stage D severe symptomatic aortic stenosis.  He describes a slow gradual progression of symptoms of exertional fatigue and shortness of breath consistent with chronic combined systolic and diastolic congestive heart failure, [place] Heart Association functional class II.  I have personally reviewed the patient's recent transthoracic echocardiogram, diagnostic cardiac catheterization, and CT angiograms.  Echocardiogram demonstrates moderate global left ventricular systolic dysfunction with ejection fraction estimated 40 to 45%.  There are no significant wall motion abnormalities.  The aortic valve is trileaflet.  There is moderate thickening and restricted leaflet mobility involving all 3 of the leaflets of the aortic valve.  Peak velocity across aortic valve measured 3.7 m/s corresponding to mean  transvalvular gradient estimated 33 mmHg but aortic valve area was calculated only 0.90 cm² and DVI notably quite low at 0.16.  Diagnostic   cardiac catheterization confirmed the presence of aortic stenosis and revealed severe native three-vessel coronary artery disease but continued patency of 3 of the 4 bypass grafts placed at the time of the patient's previous coronary artery bypass surgery in 2015.  There was chronic occlusion of the vein graft to the posterior descending coronary artery and high-grade stenosis of the distal right coronary artery.  The patient underwent successful PCI and stenting of the distal right coronary artery at that time.  Right heart pressures were normal.  I agree the patient would benefit from aortic valve replacement.  Risks associated with conventional surgical aortic valve replacement with or without redo coronary artery bypass grafting would be somewhat elevated because of the patient's previous coronary artery bypass surgery, significant left ventricular systolic dysfunction, and numerous other comorbid medical problems.  Cardiac-gated CTA of the heart reveals anatomical characteristics consistent with aortic stenosis suitable for treatment by transcatheter aortic valve replacement without any significant complicating features and CTA of the aorta and iliac vessels demonstrate what appears to be adequate pelvic vascular access to facilitate a transfemoral approach, although the patient's iliac vessels are somewhat tortuous.  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 ° ° ° °Plan: ° °The patient was counseled at length regarding treatment alternatives for management of severe symptomatic aortic stenosis. Alternative approaches such as conventional aortic valve replacement, transcatheter aortic valve replacement, and continued medical therapy without intervention were compared and contrasted at length.  The risks associated with conventional surgical aortic valve replacement  were discussed in detail, as were expectations for post-operative convalescence, and why I would be reluctant to consider this patient a candidate for conventional surgery.  Issues specific to transcatheter aortic valve replacement were discussed including questions about long term valve durability, the potential for paravalvular leak, possible increased risk of need for permanent pacemaker placement, and other technical complications related to the procedure itself.  Long-term prognosis with medical therapy was discussed. This discussion was placed in the context of the patient's own specific clinical presentation and past medical history.  All of their questions have been addressed.  The patient desires to proceed with transcatheter aortic valve replacement as soon as practical.  We tentatively plan to proceed with surgery on April 08, 2019.  The patient has been instructed to stop taking warfarin after he takes his dose on Wednesday, April 02, 2019. ° °Following the decision to proceed with transcatheter aortic valve replacement, a discussion has been held regarding what types of management strategies would be attempted intraoperatively in the event of life-threatening complications, including whether or not the patient would be considered a candidate for the use of cardiopulmonary bypass and/or conversion to open sternotomy for attempted surgical intervention.  He understands that given his previous open heart surgery emergent median sternotomy would not likely be feasible should he develop intraoperative catastrophe with rapid cardiovascular collapse.  The patient has been advised of a variety of complications that might develop including but not limited to risks of death, stroke, paravalvular leak, aortic dissection or other major vascular complications, aortic annulus rupture, device embolization, cardiac rupture or perforation, mitral regurgitation, acute myocardial infarction, arrhythmia, heart block  or bradycardia requiring permanent pacemaker placement, congestive heart failure, respiratory failure, renal failure, pneumonia, infection, other late complications related to structural valve deterioration or migration, or other complications that might ultimately cause a temporary or permanent loss of functional independence or other long term morbidity.  The patient provides full informed consent for the procedure as described and all   questions were answered. ° ° ° ° °I spent in excess of 90 minutes during the conduct of this office consultation and >50% of this time involved direct face-to-face encounter with the patient for counseling and/or coordination of their care. ° ° ° °Srihari Shellhammer H. Kyisha Fowle, MD °03/31/2019 °12:38 PM ° ° °

## 2019-03-31 NOTE — Patient Instructions (Signed)
Stop taking Coumadin (warfarin) after you take your dose on Wednesday 11/4  Continue taking all other medications without change through the day before surgery.  Have nothing to eat or drink after midnight the night before surgery.  On the morning of surgery take only Protonix with a sip of water.

## 2019-03-31 NOTE — Telephone Encounter (Signed)
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 His coumadin appointment for 11/13 has been canceled. He will call us when he is discharged to set up appointment for recheck post procedure.

## 2019-04-01 ENCOUNTER — Telehealth: Payer: Self-pay | Admitting: Pharmacist

## 2019-04-01 NOTE — Telephone Encounter (Signed)
Pt called clinic and states he has noticed some darkening of his urine earlier than normal and had some tingling in his upper leg. States he has been on Vascepa for 3 days (previously took OTC fish oil). He would like to stop his Vascepa since he has upcoming TAVR - advised pt this is just fine, especially since Vascepa has some antiplatelet properties. Will revisit resuming Vascepa after his procedure - since he tolerated OTC fish oil well and since pt symptoms are not correlated with fish oil use, Vascepa should not be causing these issues.

## 2019-04-03 NOTE — Progress Notes (Signed)
[facility], [place] [address] Phone: [phone] Fax: [facility] [place], [place] - [address] [place] [address] Phone: [phone] Fax: [phone]  [facility] [place], [place] - [address] [place] [address] Phone: [phone] Fax: [phone]    Your procedure is scheduled on Tuesday, November 10th.  Report to [facility] Main Entrance "A" at 5:30 A.M., and check in at the Admitting office.  Call this number if you have problems the morning of surgery:  [phone]  Call [phone] if you have any questions prior to your surgery date Monday-Friday 8am-4pm   Remember:  Do not eat or drink after midnight the night before your surgery  Per MD orders, on the morning of surgery take ONLY pantoprazole (PROTONIX) with a SIP OF WATER.  Per MD orders, stop taking Coumadin (warfarin) after you take your dose on Wednesday 04/02/2019.   Follow your surgeon's instructions on when to stop Aspirin and clopidogrel (PLAVIX).  If no instructions were given by your surgeon then you will need to call the office to get those instructions.  As of today, STOP taking any Aspirin (unless otherwise instructed by your surgeon), Aleve, Naproxen, Ibuprofen, Motrin, Advil, Goody's, BC's, all herbal medications, fish oil, and all vitamins.   How to Manage Your Diabetes Before and After Surgery  Why is it important to control my blood sugar before and after surgery? . Improving blood sugar levels before and after surgery helps healing and can limit problems. . A way of improving blood sugar control is eating a healthy diet by: o  Eating less sugar and carbohydrates o  Increasing activity/exercise o  Talking with your doctor about reaching your blood sugar  goals . High blood sugars (greater than 180 mg/dL) can raise your risk of infections and slow your recovery, so you will need to focus on controlling your diabetes during the weeks before surgery. . Make sure that the doctor who takes care of your diabetes knows about your planned surgery including the date and location.  How do I manage my blood sugar before surgery? . Check your blood sugar at least 4 times a day, starting 2 days before surgery, to make sure that the level is not too high or low. o Check your blood sugar the morning of your surgery when you wake up and every 2 hours until you get to the Short Stay unit. . If your blood sugar is less than 70 mg/dL, you will need to treat for low blood sugar: o Do not take insulin. o Treat a low blood sugar (less than 70 mg/dL) with  cup of clear juice (cranberry or apple), 4 glucose tablets, OR glucose gel. Recheck blood sugar in 15 minutes after treatment (to make sure it is greater than 70 mg/dL). If your blood sugar is not greater than 70 mg/dL on recheck, call [phone] o  for further instructions. . Report your blood sugar to the short stay nurse when you get to Short Stay.  . If you are admitted to the hospital after surgery: o Your blood sugar will be checked by the staff and you will probably be given insulin after surgery (instead of oral diabetes medicines) to make sure you have good blood sugar levels. o The goal for blood  sugar control after surgery is 80-180 mg/dL.  Reviewed and Endorsed by [facility] Patient Education Committee, August 2015  The Morning of Surgery  Do not wear jewelry.  Do not wear lotions, powders, colognes, or deodorant  Do not shave 48 hours prior to surgery.    Men may shave face and neck.  Do not bring valuables to the hospital.  [facility] is not responsible for any belongings or valuables.  If you are a smoker, DO NOT Smoke 24 hours prior to surgery IF you wear a CPAP at night please bring  your mask, tubing, and machine the morning of surgery   Remember that you must have someone to transport you home after your surgery, and remain with you for 24 hours if you are discharged the same day.  Contacts, glasses, hearing aids, dentures or bridgework may not be worn into surgery.   Leave your suitcase in the car.  After surgery it may be brought to your room.  For patients admitted to the hospital, discharge time will be determined by your treatment team.  Patients discharged the day of surgery will not be allowed to drive home.   Special instructions:   - Preparing For Surgery  Before surgery, you can play an important role. Because skin is not sterile, your skin needs to be as free of germs as possible. You can reduce the number of germs on your skin by washing with CHG (chlorahexidine gluconate) Soap before surgery.  CHG is an antiseptic cleaner which kills germs and bonds with the skin to continue killing germs even after washing.    Oral Hygiene is also important to reduce your risk of infection.  Remember - BRUSH YOUR TEETH THE MORNING OF SURGERY WITH YOUR REGULAR TOOTHPASTE  Please do not use if you have an allergy to CHG or antibacterial soaps. If your skin becomes reddened/irritated stop using the CHG.  Do not shave (including legs and underarms) for at least 48 hours prior to first CHG shower. It is OK to shave your face.  Please follow these instructions carefully.   1. Shower the NIGHT BEFORE SURGERY and the MORNING OF SURGERY with CHG Soap.   2. If you chose to wash your hair, wash your hair first as usual with your normal shampoo.  3. After you shampoo, rinse your hair and body thoroughly to remove the shampoo.  4. Use CHG as you would any other liquid soap. You can apply CHG directly to the skin and wash gently with a scrungie or a clean washcloth.   5. Apply the CHG Soap to your body ONLY FROM THE NECK DOWN.  Do not use on open wounds or open sores.  Avoid contact with your eyes, ears, mouth and genitals (private parts). Wash Face and genitals (private parts)  with your normal soap.   6. Wash thoroughly, paying special attention to the area where your surgery will be performed.  7. Thoroughly rinse your body with warm water from the neck down.  8. DO NOT shower/wash with your normal soap after using and rinsing off the CHG Soap.  9. Pat yourself dry with a CLEAN TOWEL.  10. Wear CLEAN PAJAMAS to bed the night before surgery, wear comfortable clothes the morning of surgery  11. Place CLEAN SHEETS on your bed the night of your first shower and DO NOT SLEEP WITH PETS.  Day of Surgery: Do not apply any deodorants/lotions. Please shower the morning of surgery with the CHG soap  Please wear clean clothes to the hospital/surgery center.   Remember to brush your teeth WITH YOUR REGULAR TOOTHPASTE.  Please read over the following fact sheets that you were given.

## 2019-04-04 ENCOUNTER — Ambulatory Visit (HOSPITAL_COMMUNITY)
Admission: RE | Admit: 2019-04-04 | Discharge: 2019-04-04 | Disposition: A | Discharge status: Home / Self Care | Payer: Medicare Other | Source: Ambulatory Visit | Attending: Cardiovascular Disease | Admitting: Cardiovascular Disease

## 2019-04-04 ENCOUNTER — Encounter (HOSPITAL_COMMUNITY)
Admission: RE | Admit: 2019-04-04 | Discharge: 2019-04-04 | Disposition: A | Discharge status: Home / Self Care | Payer: Medicare Other | Source: Ambulatory Visit | Attending: Cardiovascular Disease | Admitting: Cardiovascular Disease

## 2019-04-04 ENCOUNTER — Other Ambulatory Visit: Payer: Self-pay

## 2019-04-04 ENCOUNTER — Other Ambulatory Visit (HOSPITAL_COMMUNITY)
Admission: RE | Admit: 2019-04-04 | Discharge: 2019-04-04 | Disposition: A | Discharge status: Home / Self Care | Payer: Medicare Other | Source: Ambulatory Visit | Attending: Cardiovascular Disease | Admitting: Cardiovascular Disease

## 2019-04-04 ENCOUNTER — Encounter (HOSPITAL_COMMUNITY): Payer: Self-pay

## 2019-04-04 DIAGNOSIS — I350 Nonrheumatic aortic (valve) stenosis: Secondary | ICD-10-CM | POA: Diagnosis not present

## 2019-04-04 DIAGNOSIS — Z20828 Contact with and (suspected) exposure to other viral communicable diseases: Secondary | ICD-10-CM | POA: Diagnosis not present

## 2019-04-04 DIAGNOSIS — Z01812 Encounter for preprocedural laboratory examination: Secondary | ICD-10-CM | POA: Insufficient documentation

## 2019-04-04 LAB — CBC
HCT: 50.9 % (ref 39.0–52.0)
Hemoglobin: 17.1 g/dL — ABNORMAL HIGH (ref 13.0–17.0)
MCH: 31.1 pg (ref 26.0–34.0)
MCHC: 33.6 g/dL (ref 30.0–36.0)
MCV: 92.5 fL (ref 80.0–100.0)
Platelets: 174 10*3/uL (ref 150–400)
RBC: 5.5 MIL/uL (ref 4.22–5.81)
RDW: 13.4 % (ref 11.5–15.5)
WBC: 6.8 10*3/uL (ref 4.0–10.5)
nRBC: 0 % (ref 0.0–0.2)

## 2019-04-04 LAB — PROTIME-INR
INR: 1.9 — ABNORMAL HIGH (ref 0.8–1.2)
Prothrombin Time: 21.6 seconds — ABNORMAL HIGH (ref 11.4–15.2)

## 2019-04-04 LAB — COMPREHENSIVE METABOLIC PANEL
ALT: 24 U/L (ref 0–44)
AST: 39 U/L (ref 15–41)
Albumin: 4 g/dL (ref 3.5–5.0)
Alkaline Phosphatase: 51 U/L (ref 38–126)
Anion gap: 15 (ref 5–15)
BUN: 23 mg/dL (ref 8–23)
CO2: 18 mmol/L — ABNORMAL LOW (ref 22–32)
Calcium: 9.3 mg/dL (ref 8.9–10.3)
Chloride: 105 mmol/L (ref 98–111)
Creatinine, Ser: 1.53 mg/dL — ABNORMAL HIGH (ref 0.61–1.24)
GFR calc Af Amer: 50 mL/min — ABNORMAL LOW (ref >60)
GFR calc non Af Amer: 43 mL/min — ABNORMAL LOW (ref >60)
Glucose, Bld: 134 mg/dL — ABNORMAL HIGH (ref 70–99)
Potassium: 4.7 mmol/L (ref 3.5–5.1)
Sodium: 138 mmol/L (ref 135–145)
Total Bilirubin: 2.1 mg/dL — ABNORMAL HIGH (ref 0.3–1.2)
Total Protein: 7 g/dL (ref 6.5–8.1)

## 2019-04-04 LAB — URINALYSIS, ROUTINE W REFLEX MICROSCOPIC
Bilirubin Urine: NEGATIVE
Glucose, UA: NEGATIVE mg/dL
Hgb urine dipstick: NEGATIVE
Ketones, ur: NEGATIVE mg/dL
Leukocytes,Ua: NEGATIVE
Nitrite: NEGATIVE
Protein, ur: NEGATIVE mg/dL
Specific Gravity, Urine: 1.017 (ref 1.005–1.030)
pH: 5 (ref 5.0–8.0)

## 2019-04-04 LAB — APTT: aPTT: 39 seconds — ABNORMAL HIGH (ref 24–36)

## 2019-04-04 LAB — HEMOGLOBIN A1C
Hgb A1c MFr Bld: 6.3 % — ABNORMAL HIGH (ref 4.8–5.6)
Mean Plasma Glucose: 134.11 mg/dL

## 2019-04-04 LAB — GLUCOSE, CAPILLARY: Glucose-Capillary: 133 mg/dL — ABNORMAL HIGH (ref 70–99)

## 2019-04-04 LAB — BRAIN NATRIURETIC PEPTIDE: B Natriuretic Peptide: 70.7 pg/mL (ref 0.0–100.0)

## 2019-04-04 LAB — SURGICAL PCR SCREEN
MRSA, PCR: NEGATIVE
Staphylococcus aureus: POSITIVE — AB

## 2019-04-04 LAB — TYPE AND SCREEN
ABO/RH(D): O POS
Antibody Screen: NEGATIVE

## 2019-04-04 NOTE — Progress Notes (Addendum)
PCP - Dr Drema Dallas  Cardiologist - Dr Nicholas Lose  Chest x-ray - 04/04/2019  EKG - 1023/2020  Stress Test - 03/23/2017  ECHO - 01/31/2019  Cardiac Cath - 03/21/2019  Pacer- Pre- op device orders faxed to device clinic. I emailed [employer] respresentatives  Sleep Study - yes (5 years ago) CPAP - no  LABS-CBC, CMP, PT, PTT, UA, A1C, BNP, T?S  ASA-continue until day of surgery , Plavix - continue until day of surgery, Coumadin on hold  ERAS-no  HA1C- 6.3 Fasting Blood Sugar - 120 Checks Blood Sugar ___1__ times a day   Patient does not take medications for diabetes Anesthesia-  Pt denies having chest pain, sob, or fever at this time. All instructions explained to the pt, with a verbal understanding of the material. Pt agrees to go over the instructions while at home for a better understanding. Pt also instructed to self quarantine after being tested for COVID-19. The opportunity to ask questions was provided.  Mr. Chaikin reports that he has an ingrown toenail, left great toe. Patient states that he haas had it a while and showed it to Dr Angelena Form prior to cath.  Mr Vandermeulen reports that PCP Dr Drema Dallas prescribed antibiotics and he has finished them.  Mr Normoyle reports that he still has some bloody, gel drainage from it, "I worked on it a little last night,because it was hurting."  I called and left a voice message for Theodosia Quay with information. Dr Burt Knack came to PAT check left great toe- no antibiotics needed.

## 2019-04-04 NOTE — Progress Notes (Signed)
Your procedure is scheduled on Tuesday, November 10th.  Report to [facility] Main Entrance "A" at 5:30 A.M., and check in at the Admitting office.            Your surgery or procedure is scheduled for 7:30 AM  Call this number if you have problems the morning of surgery:  [phone]  Call [phone] if you have any questions prior to your surgery date Monday-Friday 8am-4pm   Remember:  Do not eat or drink after midnight the night before your surgery  Per MD orders, on the morning of surgery take ONLY pantoprazole (PROTONIX) with a SIP OF WATER.  Per MD orders, stop taking Coumadin (warfarin) after you take your dose on Wednesday 04/02/2019.   Follow your surgeon's instructions on when to stop Aspirin and clopidogrel (PLAVIX).  If no instructions were given by your surgeon then you will need to call the office to get those instructions.  As of today, STOP taking any Aspirin (unless otherwise instructed by your surgeon), Aleve, Naproxen, Ibuprofen, Motrin, Advil, Goody's, BC's, all herbal medications, fish oil, and all vitamins.   How to Manage Your Diabetes Before and After Surgery  Why is it important to control my blood sugar before and after surgery? . Improving blood sugar levels before and after surgery helps healing and can limit problems. . A way of improving blood sugar control is eating a healthy diet by: o  Eating less sugar and carbohydrates o  Increasing activity/exercise o  Talking with your doctor about reaching your blood sugar goals . High blood sugars (greater than 180 mg/dL) can raise your risk of infections and slow your recovery, so you will need to focus on controlling your diabetes during the weeks before surgery. . Make sure that the doctor who takes care of your diabetes knows about your planned surgery including the date and location.  How do I manage my blood sugar before surgery? . Check your blood sugar at least 4 times a day, starting 2 days before  surgery, to make sure that the level is not too high or low. o Check your blood sugar the morning of your surgery when you wake up and every 2 hours until you get to the Short Stay unit. . If your blood sugar is less than 70 mg/dL, you will need to treat for low blood sugar: o Do not take insulin. o Treat a low blood sugar (less than 70 mg/dL) with  cup of clear juice (cranberry or apple), 4 glucose tablets, OR glucose gel. Recheck blood sugar in 15 minutes after treatment (to make sure it is greater than 70 mg/dL). If your blood sugar is not greater than 70 mg/dL on recheck, call [phone] o  for further instructions. . Report your blood sugar to the short stay nurse when you get to Short Stay.  . If you are admitted to the hospital after surgery: o Your blood sugar will be checked by the staff and you will probably be given insulin after surgery (instead of oral diabetes medicines) to make sure you have good blood sugar levels. o The goal for blood sugar control after surgery is 80-180 mg/dL.  Special instructions:   [place]- Preparing For Surgery  Before surgery, you can play an important role. Because skin is not sterile, your skin needs to be as free of germs as possible. You can reduce the number of germs on your skin by washing with CHG (chlorahexidine gluconate) Soap before surgery.  CHG  is an antiseptic cleaner which kills germs and bonds with the skin to continue killing germs even after washing.    Oral Hygiene is also important to reduce your risk of infection.  Remember - BRUSH YOUR TEETH THE MORNING OF SURGERY WITH YOUR REGULAR TOOTHPASTE  Please do not use if you have an allergy to CHG or antibacterial soaps. If your skin becomes reddened/irritated stop using the CHG.  Do not shave (including legs and underarms) for at least 48 hours prior to first CHG shower. It is OK to shave your face.  Please follow these instructions carefully.   1. Shower the NIGHT BEFORE SURGERY  and the MORNING OF SURGERY with CHG Soap.   2. If you chose to wash your hair, wash your hair first as usual with your normal shampoo.  3. After you shampoo,wash your face and private area with the soap you use at home, then rinse your hair and body thoroughly to remove the shampoo and soap.   4. Use CHG as you would any other liquid soap. You can apply CHG directly to the skin and wash gently with a scrungie or a clean washcloth.   5. Apply the CHG Soap to your body ONLY FROM THE NECK DOWN.  Do not use on open wounds or open sores. Avoid contact with your eyes, ears, mouth and genitals (private parts).  6. Wash thoroughly, paying special attention to the area where your surgery will be performed.  7. Thoroughly rinse your body with warm water from the neck down.  8. DO NOT shower/wash with your normal soap after using and rinsing off the CHG Soap.  9. Pat yourself dry with a CLEAN TOWEL.  10. Wear CLEAN PAJAMAS to bed the night before surgery, wear comfortable clothes the morning of surgery  11. Place CLEAN SHEETS on your bed the night of your first shower and DO NOT SLEEP WITH PETS.  Day of Surgery: Shower as instructed above. Do not wear lotions, powders, colognes, or deodorant Please wear clean clothes to the hospital/surgery center.   Remember to brush your teeth WITH YOUR REGULAR TOOTHPASTE.  Do not wear jewelry.     Men may shave face and neck.  Do not bring valuables to the hospital.  [facility] is not responsible for any belongings or valuables.  If you are a smoker, DO NOT Smoke 24 hours prior to surgery IF you wear a CPAP at night please bring your mask, tubing, and machine the morning of surgery   Remember that you must have someone to transport you home after your surgery, and remain with you for 24 hours if you are discharged the same day.  Contacts, glasses, hearing aids, dentures or bridgework may not be worn into surgery.   Leave your suitcase in the car.   After surgery it may be brought to your room.  For patients admitted to the hospital, discharge time will be determined by your treatment team.   Please read over the following fact sheets that you were given: Pain Booklet, Patient Instructions for Mupirocin Application, Coughing and Deep Breathing and Incentive Spirometry, Surgical Site Infections.

## 2019-04-05 LAB — NOVEL CORONAVIRUS, NAA (HOSP ORDER, SEND-OUT TO REF LAB; TAT 18-24 HRS): SARS-CoV-2, NAA: NOT DETECTED

## 2019-04-07 ENCOUNTER — Encounter (HOSPITAL_COMMUNITY): Payer: Self-pay | Admitting: Certified Registered Nurse Anesthetist

## 2019-04-07 MED ORDER — LEVOFLOXACIN IN D5W 500 MG/100ML IV SOLN
500.0000 mg | INTRAVENOUS | Status: AC
  Administered 2019-04-08: 500 mg via INTRAVENOUS
  Filled 2019-04-07: qty 100

## 2019-04-07 MED ORDER — VANCOMYCIN HCL 10 G IV SOLR
1500.0000 mg | INTRAVENOUS | Status: AC
  Administered 2019-04-08: 1500 mg via INTRAVENOUS
  Filled 2019-04-07: qty 1500

## 2019-04-07 MED ORDER — MAGNESIUM SULFATE 50 % IJ SOLN
40.0000 meq | INTRAMUSCULAR | Status: DC
  Filled 2019-04-07: qty 9.85

## 2019-04-07 MED ORDER — POTASSIUM CHLORIDE 2 MEQ/ML IV SOLN
80.0000 meq | INTRAVENOUS | Status: DC
  Filled 2019-04-07: qty 40

## 2019-04-07 MED ORDER — NOREPINEPHRINE 4 MG/250ML-% IV SOLN
0.0000 ug/min | INTRAVENOUS | Status: AC
  Administered 2019-04-08: 1 ug/min via INTRAVENOUS
  Filled 2019-04-07: qty 250

## 2019-04-07 MED ORDER — DEXMEDETOMIDINE HCL IN NACL 400 MCG/100ML IV SOLN
0.1000 ug/kg/h | INTRAVENOUS | Status: AC
  Administered 2019-04-08: 1 ug/kg/h via INTRAVENOUS
  Filled 2019-04-07: qty 100

## 2019-04-07 MED ORDER — SODIUM CHLORIDE 0.9 % IV SOLN
INTRAVENOUS | Status: DC
  Filled 2019-04-07: qty 30

## 2019-04-07 NOTE — Anesthesia Preprocedure Evaluation (Addendum)
Anesthesia Evaluation  Patient identified by MRN, date of birth, ID band Patient awake    Reviewed: Allergy & Precautions, NPO status , Patient's Chart, lab work & pertinent test results  Airway Mallampati: II  TM Distance: >3 FB     Dental   Pulmonary former smoker,    breath sounds clear to auscultation       Cardiovascular hypertension, + angina + CAD and +CHF  + dysrhythmias + pacemaker  Rhythm:Regular Rate:Normal     Neuro/Psych    GI/Hepatic Neg liver ROS, GERD  ,  Endo/Other  diabetes  Renal/GU Renal disease     Musculoskeletal   Abdominal   Peds  Hematology   Anesthesia Other Findings   Reproductive/Obstetrics                            Anesthesia Physical Anesthesia Plan  ASA: III  Anesthesia Plan: MAC   Post-op Pain Management:    Induction: Intravenous  PONV Risk Score and Plan: 1 and Midazolam  Airway Management Planned: Simple Face Mask and Mask  Additional Equipment:   Intra-op Plan:   Post-operative Plan: Extubation in OR  Informed Consent: I have reviewed the patients History and Physical, chart, labs and discussed the procedure including the risks, benefits and alternatives for the proposed anesthesia with the patient or authorized representative who has indicated his/her understanding and acceptance.     Dental advisory given  Plan Discussed with: Anesthesiologist and CRNA  Anesthesia Plan Comments:        Anesthesia Quick Evaluation

## 2019-04-08 ENCOUNTER — Inpatient Hospital Stay (HOSPITAL_COMMUNITY): Payer: Medicare Other

## 2019-04-08 ENCOUNTER — Inpatient Hospital Stay (HOSPITAL_COMMUNITY): Payer: Medicare Other | Admitting: Certified Registered Nurse Anesthetist

## 2019-04-08 ENCOUNTER — Inpatient Hospital Stay (HOSPITAL_COMMUNITY): Payer: Medicare Other | Admitting: Vascular Surgery

## 2019-04-08 ENCOUNTER — Inpatient Hospital Stay (HOSPITAL_COMMUNITY)
Admission: RE | Admit: 2019-04-08 | Discharge: 2019-04-09 | DRG: 267 | Disposition: A | Discharge status: Home / Self Care | Payer: Medicare Other | Source: Ambulatory Visit | Attending: Cardiovascular Disease | Admitting: Cardiovascular Disease

## 2019-04-08 ENCOUNTER — Other Ambulatory Visit: Payer: Self-pay | Admitting: Physician Assistant

## 2019-04-08 ENCOUNTER — Other Ambulatory Visit: Payer: Self-pay

## 2019-04-08 ENCOUNTER — Encounter (HOSPITAL_COMMUNITY): Payer: Self-pay

## 2019-04-08 ENCOUNTER — Encounter (HOSPITAL_COMMUNITY)
Admission: RE | Disposition: A | Discharge status: Home / Self Care | Payer: Self-pay | Source: Ambulatory Visit | Attending: Cardiovascular Disease

## 2019-04-08 DIAGNOSIS — I25810 Atherosclerosis of coronary artery bypass graft(s) without angina pectoris: Secondary | ICD-10-CM | POA: Diagnosis present

## 2019-04-08 DIAGNOSIS — I10 Essential (primary) hypertension: Secondary | ICD-10-CM | POA: Diagnosis present

## 2019-04-08 DIAGNOSIS — Z6837 Body mass index (BMI) 37.0-37.9, adult: Secondary | ICD-10-CM

## 2019-04-08 DIAGNOSIS — N182 Chronic kidney disease, stage 2 (mild): Secondary | ICD-10-CM | POA: Diagnosis present

## 2019-04-08 DIAGNOSIS — I350 Nonrheumatic aortic (valve) stenosis: Secondary | ICD-10-CM

## 2019-04-08 DIAGNOSIS — Z952 Presence of prosthetic heart valve: Secondary | ICD-10-CM | POA: Diagnosis not present

## 2019-04-08 DIAGNOSIS — Z951 Presence of aortocoronary bypass graft: Secondary | ICD-10-CM

## 2019-04-08 DIAGNOSIS — Z87891 Personal history of nicotine dependence: Secondary | ICD-10-CM | POA: Diagnosis not present

## 2019-04-08 DIAGNOSIS — I25110 Atherosclerotic heart disease of native coronary artery with unstable angina pectoris: Secondary | ICD-10-CM | POA: Diagnosis present

## 2019-04-08 DIAGNOSIS — Z79899 Other long term (current) drug therapy: Secondary | ICD-10-CM

## 2019-04-08 DIAGNOSIS — I5042 Chronic combined systolic (congestive) and diastolic (congestive) heart failure: Secondary | ICD-10-CM | POA: Diagnosis present

## 2019-04-08 DIAGNOSIS — Z885 Allergy status to narcotic agent status: Secondary | ICD-10-CM | POA: Diagnosis not present

## 2019-04-08 DIAGNOSIS — K219 Gastro-esophageal reflux disease without esophagitis: Secondary | ICD-10-CM | POA: Diagnosis present

## 2019-04-08 DIAGNOSIS — Z881 Allergy status to other antibiotic agents status: Secondary | ICD-10-CM | POA: Diagnosis not present

## 2019-04-08 DIAGNOSIS — Z7982 Long term (current) use of aspirin: Secondary | ICD-10-CM

## 2019-04-08 DIAGNOSIS — I083 Combined rheumatic disorders of mitral, aortic and tricuspid valves: Principal | ICD-10-CM | POA: Diagnosis present

## 2019-04-08 DIAGNOSIS — Z7902 Long term (current) use of antithrombotics/antiplatelets: Secondary | ICD-10-CM | POA: Diagnosis not present

## 2019-04-08 DIAGNOSIS — Z8249 Family history of ischemic heart disease and other diseases of the circulatory system: Secondary | ICD-10-CM

## 2019-04-08 DIAGNOSIS — I480 Paroxysmal atrial fibrillation: Secondary | ICD-10-CM | POA: Diagnosis present

## 2019-04-08 DIAGNOSIS — E1122 Type 2 diabetes mellitus with diabetic chronic kidney disease: Secondary | ICD-10-CM | POA: Diagnosis present

## 2019-04-08 DIAGNOSIS — Z006 Encounter for examination for normal comparison and control in clinical research program: Secondary | ICD-10-CM

## 2019-04-08 DIAGNOSIS — I2510 Atherosclerotic heart disease of native coronary artery without angina pectoris: Secondary | ICD-10-CM | POA: Diagnosis present

## 2019-04-08 DIAGNOSIS — E669 Obesity, unspecified: Secondary | ICD-10-CM | POA: Diagnosis present

## 2019-04-08 DIAGNOSIS — M1711 Unilateral primary osteoarthritis, right knee: Secondary | ICD-10-CM | POA: Diagnosis present

## 2019-04-08 DIAGNOSIS — E785 Hyperlipidemia, unspecified: Secondary | ICD-10-CM | POA: Diagnosis present

## 2019-04-08 DIAGNOSIS — I130 Hypertensive heart and chronic kidney disease with heart failure and stage 1 through stage 4 chronic kidney disease, or unspecified chronic kidney disease: Secondary | ICD-10-CM | POA: Diagnosis present

## 2019-04-08 DIAGNOSIS — Z888 Allergy status to other drugs, medicaments and biological substances status: Secondary | ICD-10-CM

## 2019-04-08 DIAGNOSIS — G4733 Obstructive sleep apnea (adult) (pediatric): Secondary | ICD-10-CM | POA: Diagnosis present

## 2019-04-08 DIAGNOSIS — R001 Bradycardia, unspecified: Secondary | ICD-10-CM | POA: Diagnosis present

## 2019-04-08 DIAGNOSIS — Z8719 Personal history of other diseases of the digestive system: Secondary | ICD-10-CM

## 2019-04-08 DIAGNOSIS — I700 Atherosclerosis of aorta: Secondary | ICD-10-CM | POA: Diagnosis present

## 2019-04-08 DIAGNOSIS — Z9049 Acquired absence of other specified parts of digestive tract: Secondary | ICD-10-CM

## 2019-04-08 DIAGNOSIS — Z7901 Long term (current) use of anticoagulants: Secondary | ICD-10-CM | POA: Diagnosis not present

## 2019-04-08 DIAGNOSIS — E119 Type 2 diabetes mellitus without complications: Secondary | ICD-10-CM

## 2019-04-08 DIAGNOSIS — N189 Chronic kidney disease, unspecified: Secondary | ICD-10-CM | POA: Diagnosis present

## 2019-04-08 HISTORY — PX: TRANSCATHETER AORTIC VALVE REPLACEMENT, TRANSFEMORAL: SHX6400

## 2019-04-08 HISTORY — DX: Presence of prosthetic heart valve: Z95.2

## 2019-04-08 HISTORY — PX: TEE WITHOUT CARDIOVERSION: SHX5443

## 2019-04-08 HISTORY — DX: Nonrheumatic aortic (valve) stenosis: I35.0

## 2019-04-08 LAB — POCT I-STAT, CHEM 8
BUN: 27 mg/dL — ABNORMAL HIGH (ref 8–23)
BUN: 26 mg/dL — ABNORMAL HIGH (ref 8–23)
BUN: 27 mg/dL — ABNORMAL HIGH (ref 8–23)
BUN: 18 mg/dL (ref 8–23)
BUN: 19 mg/dL (ref 8–23)
BUN: 26 mg/dL — ABNORMAL HIGH (ref 8–23)
BUN: 18 mg/dL (ref 8–23)
Calcium, Ion: 1.24 mmol/L (ref 1.15–1.40)
Calcium, Ion: 1.21 mmol/L (ref 1.15–1.40)
Calcium, Ion: 1.23 mmol/L (ref 1.15–1.40)
Calcium, Ion: 1.17 mmol/L (ref 1.15–1.40)
Calcium, Ion: 1.18 mmol/L (ref 1.15–1.40)
Calcium, Ion: 1.24 mmol/L (ref 1.15–1.40)
Calcium, Ion: 1.16 mmol/L (ref 1.15–1.40)
Chloride: 102 mmol/L (ref 98–111)
Chloride: 101 mmol/L (ref 98–111)
Chloride: 102 mmol/L (ref 98–111)
Chloride: 101 mmol/L (ref 98–111)
Chloride: 102 mmol/L (ref 98–111)
Chloride: 103 mmol/L (ref 98–111)
Chloride: 103 mmol/L (ref 98–111)
Creatinine, Ser: 1.3 mg/dL — ABNORMAL HIGH (ref 0.61–1.24)
Creatinine, Ser: 1.3 mg/dL — ABNORMAL HIGH (ref 0.61–1.24)
Creatinine, Ser: 1.3 mg/dL — ABNORMAL HIGH (ref 0.61–1.24)
Creatinine, Ser: 0.8 mg/dL (ref 0.61–1.24)
Creatinine, Ser: 0.9 mg/dL (ref 0.61–1.24)
Creatinine, Ser: 1.3 mg/dL — ABNORMAL HIGH (ref 0.61–1.24)
Creatinine, Ser: 0.7 mg/dL (ref 0.61–1.24)
Glucose, Bld: 141 mg/dL — ABNORMAL HIGH (ref 70–99)
Glucose, Bld: 184 mg/dL — ABNORMAL HIGH (ref 70–99)
Glucose, Bld: 187 mg/dL — ABNORMAL HIGH (ref 70–99)
Glucose, Bld: 118 mg/dL — ABNORMAL HIGH (ref 70–99)
Glucose, Bld: 120 mg/dL — ABNORMAL HIGH (ref 70–99)
Glucose, Bld: 160 mg/dL — ABNORMAL HIGH (ref 70–99)
Glucose, Bld: 120 mg/dL — ABNORMAL HIGH (ref 70–99)
HCT: 47 % (ref 39.0–52.0)
HCT: 44 % (ref 39.0–52.0)
HCT: 45 % (ref 39.0–52.0)
HCT: 36 % — ABNORMAL LOW (ref 39.0–52.0)
HCT: 39 % (ref 39.0–52.0)
HCT: 44 % (ref 39.0–52.0)
HCT: 34 % — ABNORMAL LOW (ref 39.0–52.0)
Hemoglobin: 16 g/dL (ref 13.0–17.0)
Hemoglobin: 15 g/dL (ref 13.0–17.0)
Hemoglobin: 15.3 g/dL (ref 13.0–17.0)
Hemoglobin: 12.2 g/dL — ABNORMAL LOW (ref 13.0–17.0)
Hemoglobin: 13.3 g/dL (ref 13.0–17.0)
Hemoglobin: 15 g/dL (ref 13.0–17.0)
Hemoglobin: 11.6 g/dL — ABNORMAL LOW (ref 13.0–17.0)
Potassium: 4.2 mmol/L (ref 3.5–5.1)
Potassium: 4.4 mmol/L (ref 3.5–5.1)
Potassium: 4.5 mmol/L (ref 3.5–5.1)
Potassium: 3.1 mmol/L — ABNORMAL LOW (ref 3.5–5.1)
Potassium: 3.2 mmol/L — ABNORMAL LOW (ref 3.5–5.1)
Potassium: 4.2 mmol/L (ref 3.5–5.1)
Potassium: 3.2 mmol/L — ABNORMAL LOW (ref 3.5–5.1)
Sodium: 139 mmol/L (ref 135–145)
Sodium: 137 mmol/L (ref 135–145)
Sodium: 137 mmol/L (ref 135–145)
Sodium: 138 mmol/L (ref 135–145)
Sodium: 144 mmol/L (ref 135–145)
Sodium: 144 mmol/L (ref 135–145)
Sodium: 142 mmol/L (ref 135–145)
TCO2: 22 mmol/L (ref 22–32)
TCO2: 23 mmol/L (ref 22–32)
TCO2: 24 mmol/L (ref 22–32)
TCO2: 22 mmol/L (ref 22–32)
TCO2: 28 mmol/L (ref 22–32)
TCO2: 29 mmol/L (ref 22–32)
TCO2: 28 mmol/L (ref 22–32)

## 2019-04-08 LAB — BLOOD GAS, ARTERIAL
Acid-Base Excess: 0.6 mmol/L (ref 0.0–2.0)
Bicarbonate: 24.4 mmol/L (ref 20.0–28.0)
FIO2: 32
O2 Saturation: 98.4 %
Patient temperature: 37
pCO2 arterial: 36.8 mmHg (ref 32.0–48.0)
pH, Arterial: 7.437 (ref 7.350–7.450)
pO2, Arterial: 118 mmHg — ABNORMAL HIGH (ref 83.0–108.0)

## 2019-04-08 LAB — POCT I-STAT 7, (LYTES, BLD GAS, ICA,H+H)
Acid-Base Excess: 1 mmol/L (ref 0.0–2.0)
Bicarbonate: 25.3 mmol/L (ref 20.0–28.0)
Calcium, Ion: 1.02 mmol/L — ABNORMAL LOW (ref 1.15–1.40)
HCT: 47 % (ref 39.0–52.0)
Hemoglobin: 16 g/dL (ref 13.0–17.0)
O2 Saturation: 100 %
Potassium: 4.3 mmol/L (ref 3.5–5.1)
Sodium: 138 mmol/L (ref 135–145)
TCO2: 26 mmol/L (ref 22–32)
pCO2 arterial: 37.2 mmHg (ref 32.0–48.0)
pH, Arterial: 7.44 (ref 7.350–7.450)
pO2, Arterial: 199 mmHg — ABNORMAL HIGH (ref 83.0–108.0)

## 2019-04-08 LAB — GLUCOSE, CAPILLARY: Glucose-Capillary: 147 mg/dL — ABNORMAL HIGH (ref 70–99)

## 2019-04-08 LAB — PROTIME-INR
INR: 1 (ref 0.8–1.2)
Prothrombin Time: 13.3 seconds (ref 11.4–15.2)

## 2019-04-08 SURGERY — IMPLANTATION, AORTIC VALVE, TRANSCATHETER, FEMORAL APPROACH
Anesthesia: Monitor Anesthesia Care | Site: Chest

## 2019-04-08 MED ORDER — ONDANSETRON HCL 4 MG/2ML IJ SOLN: 4.0000 mg | Freq: Four times a day (QID) | INTRAMUSCULAR | Status: DC | PRN

## 2019-04-08 MED ORDER — LIDOCAINE HCL 1 % IJ SOLN
INTRAMUSCULAR | Status: DC | PRN
  Administered 2019-04-08: 30 mL

## 2019-04-08 MED ORDER — FLUTICASONE PROPIONATE 50 MCG/ACT NA SUSP
2.0000 | Freq: Every day | NASAL | Status: DC
  Administered 2019-04-09: 2 via NASAL
  Filled 2019-04-08: qty 16

## 2019-04-08 MED ORDER — SODIUM CHLORIDE 0.9 % IV SOLN
INTRAVENOUS | Status: AC
  Filled 2019-04-08 (×3): qty 1.2

## 2019-04-08 MED ORDER — SODIUM CHLORIDE 0.9% FLUSH: 3.0000 mL | INTRAVENOUS | Status: DC | PRN

## 2019-04-08 MED ORDER — ASPIRIN 81 MG PO TBEC: 81.0000 mg | DELAYED_RELEASE_TABLET | Freq: Every day | ORAL | Status: DC

## 2019-04-08 MED ORDER — SODIUM CHLORIDE 0.9 % IV SOLN
INTRAVENOUS | Status: DC | PRN
  Administered 2019-04-08: 1500 mL via INTRAMUSCULAR

## 2019-04-08 MED ORDER — CHLORHEXIDINE GLUCONATE 4 % EX LIQD: 30.0000 mL | CUTANEOUS | Status: DC

## 2019-04-08 MED ORDER — CHLORHEXIDINE GLUCONATE 4 % EX LIQD: 60.0000 mL | Freq: Once | CUTANEOUS | Status: DC

## 2019-04-08 MED ORDER — PANTOPRAZOLE SODIUM 40 MG PO TBEC
40.0000 mg | DELAYED_RELEASE_TABLET | Freq: Every day | ORAL | Status: DC
  Administered 2019-04-09: 40 mg via ORAL
  Filled 2019-04-08: qty 1

## 2019-04-08 MED ORDER — CLOPIDOGREL BISULFATE 75 MG PO TABS
75.0000 mg | ORAL_TABLET | Freq: Every day | ORAL | Status: DC
  Administered 2019-04-09: 75 mg via ORAL
  Filled 2019-04-08: qty 1

## 2019-04-08 MED ORDER — FENTANYL CITRATE (PF) 100 MCG/2ML IJ SOLN
INTRAMUSCULAR | Status: DC | PRN
  Administered 2019-04-08: 50 ug via INTRAVENOUS

## 2019-04-08 MED ORDER — LACTATED RINGERS IV SOLN
INTRAVENOUS | Status: DC | PRN
  Administered 2019-04-08: 07:00:00 via INTRAVENOUS

## 2019-04-08 MED ORDER — SODIUM CHLORIDE 0.9% FLUSH
3.0000 mL | Freq: Two times a day (BID) | INTRAVENOUS | Status: DC
  Administered 2019-04-08 – 2019-04-09 (×2): 3 mL via INTRAVENOUS

## 2019-04-08 MED ORDER — ASPIRIN EC 81 MG PO TBEC
81.0000 mg | DELAYED_RELEASE_TABLET | Freq: Every day | ORAL | Status: DC
  Administered 2019-04-09: 81 mg via ORAL
  Filled 2019-04-08: qty 1

## 2019-04-08 MED ORDER — LIDOCAINE 2% (20 MG/ML) 5 ML SYRINGE
INTRAMUSCULAR | Status: AC
  Filled 2019-04-08: qty 5

## 2019-04-08 MED ORDER — SODIUM CHLORIDE 0.9 % IV SOLN: INTRAVENOUS | Status: DC

## 2019-04-08 MED ORDER — FENTANYL CITRATE (PF) 250 MCG/5ML IJ SOLN
INTRAMUSCULAR | Status: AC
  Filled 2019-04-08: qty 5

## 2019-04-08 MED ORDER — HEPARIN SODIUM (PORCINE) 1000 UNIT/ML IJ SOLN
INTRAMUSCULAR | Status: AC
  Filled 2019-04-08: qty 1

## 2019-04-08 MED ORDER — PROPOFOL 1000 MG/100ML IV EMUL
INTRAVENOUS | Status: AC
  Filled 2019-04-08: qty 100

## 2019-04-08 MED ORDER — CHLORHEXIDINE GLUCONATE 0.12 % MT SOLN
15.0000 mL | Freq: Once | OROMUCOSAL | Status: AC
  Administered 2019-04-08: 15 mL via OROMUCOSAL
  Filled 2019-04-08: qty 15

## 2019-04-08 MED ORDER — MIDAZOLAM HCL 2 MG/2ML IJ SOLN
INTRAMUSCULAR | Status: DC | PRN
  Administered 2019-04-08: 1 mg via INTRAVENOUS

## 2019-04-08 MED ORDER — 0.9 % SODIUM CHLORIDE (POUR BTL) OPTIME
TOPICAL | Status: DC | PRN
  Administered 2019-04-08: 1000 mL

## 2019-04-08 MED ORDER — SODIUM CHLORIDE 0.9 % IV SOLN
INTRAVENOUS | Status: DC
  Administered 2019-04-08: 10:00:00 via INTRAVENOUS

## 2019-04-08 MED ORDER — HEPARIN SODIUM (PORCINE) 1000 UNIT/ML IJ SOLN
INTRAMUSCULAR | Status: DC | PRN
  Administered 2019-04-08: 15000 [IU] via INTRAVENOUS

## 2019-04-08 MED ORDER — IODIXANOL 320 MG/ML IV SOLN
INTRAVENOUS | Status: DC | PRN
  Administered 2019-04-08: 89 mL

## 2019-04-08 MED ORDER — PROPOFOL 500 MG/50ML IV EMUL
INTRAVENOUS | Status: DC | PRN
  Administered 2019-04-08: 20 ug/kg/min via INTRAVENOUS

## 2019-04-08 MED ORDER — TRAMADOL HCL 50 MG PO TABS: 50.0000 mg | ORAL_TABLET | ORAL | Status: DC | PRN

## 2019-04-08 MED ORDER — LIDOCAINE HCL (PF) 1 % IJ SOLN
INTRAMUSCULAR | Status: AC
  Filled 2019-04-08: qty 30

## 2019-04-08 MED ORDER — VANCOMYCIN HCL IN DEXTROSE 1-5 GM/200ML-% IV SOLN
1000.0000 mg | Freq: Once | INTRAVENOUS | Status: AC
  Administered 2019-04-08: 1000 mg via INTRAVENOUS
  Filled 2019-04-08: qty 200

## 2019-04-08 MED ORDER — PHENYLEPHRINE HCL-NACL 20-0.9 MG/250ML-% IV SOLN
0.0000 ug/min | INTRAVENOUS | Status: DC
  Filled 2019-04-08: qty 250

## 2019-04-08 MED ORDER — LEVOFLOXACIN IN D5W 750 MG/150ML IV SOLN
750.0000 mg | INTRAVENOUS | Status: AC
  Administered 2019-04-09: 750 mg via INTRAVENOUS
  Filled 2019-04-08: qty 150

## 2019-04-08 MED ORDER — MORPHINE SULFATE (PF) 2 MG/ML IV SOLN: 1.0000 mg | INTRAVENOUS | Status: DC | PRN

## 2019-04-08 MED ORDER — OXYCODONE HCL 5 MG PO TABS: 5.0000 mg | ORAL_TABLET | ORAL | Status: DC | PRN

## 2019-04-08 MED ORDER — NITROGLYCERIN IN D5W 200-5 MCG/ML-% IV SOLN: 0.0000 ug/min | INTRAVENOUS | Status: DC

## 2019-04-08 MED ORDER — MIDAZOLAM HCL 2 MG/2ML IJ SOLN
INTRAMUSCULAR | Status: AC
  Filled 2019-04-08: qty 2

## 2019-04-08 MED ORDER — LORATADINE 10 MG PO TABS
10.0000 mg | ORAL_TABLET | Freq: Every day | ORAL | Status: DC
  Administered 2019-04-09: 10 mg via ORAL
  Filled 2019-04-08: qty 1

## 2019-04-08 MED ORDER — DEXMEDETOMIDINE HCL IN NACL 200 MCG/50ML IV SOLN
INTRAVENOUS | Status: DC | PRN
  Administered 2019-04-08: 106.6 ug via INTRAVENOUS

## 2019-04-08 MED ORDER — PROPOFOL 10 MG/ML IV BOLUS
INTRAVENOUS | Status: AC
  Filled 2019-04-08: qty 20

## 2019-04-08 MED ORDER — CARVEDILOL 25 MG PO TABS
25.0000 mg | ORAL_TABLET | Freq: Two times a day (BID) | ORAL | Status: DC
  Administered 2019-04-08 – 2019-04-09 (×2): 25 mg via ORAL
  Filled 2019-04-08 (×2): qty 1

## 2019-04-08 MED ORDER — PROTAMINE SULFATE 10 MG/ML IV SOLN
INTRAVENOUS | Status: DC | PRN
  Administered 2019-04-08: 150 mg via INTRAVENOUS

## 2019-04-08 MED ORDER — ATORVASTATIN CALCIUM 80 MG PO TABS
80.0000 mg | ORAL_TABLET | Freq: Every day | ORAL | Status: DC
  Administered 2019-04-08: 80 mg via ORAL
  Filled 2019-04-08: qty 1

## 2019-04-08 MED ORDER — PROTAMINE SULFATE 10 MG/ML IV SOLN
INTRAVENOUS | Status: AC
  Filled 2019-04-08: qty 25

## 2019-04-08 MED ORDER — DOCUSATE SODIUM 100 MG PO CAPS
200.0000 mg | ORAL_CAPSULE | Freq: Every day | ORAL | Status: DC
  Administered 2019-04-09: 200 mg via ORAL
  Filled 2019-04-08: qty 2

## 2019-04-08 MED ORDER — SODIUM CHLORIDE 0.9 % IV SOLN: 250.0000 mL | INTRAVENOUS | Status: DC | PRN

## 2019-04-08 SURGICAL SUPPLY — 87 items
BAG DECANTER FOR FLEXI CONT (MISCELLANEOUS) IMPLANT
BAG SNAP BAND KOVER 36X36 (MISCELLANEOUS) ×8 IMPLANT
BLADE CLIPPER SURG (BLADE) IMPLANT
BLADE OSCILLATING /SAGITTAL (BLADE) IMPLANT
BLADE STERNUM SYSTEM 6 (BLADE) IMPLANT
BLADE SURG 10 STRL SS (BLADE) IMPLANT
CABLE ADAPT CONN TEMP 6FT (ADAPTER) ×4 IMPLANT
CATH DIAG 6FR PIGTAIL (CATHETERS) ×4 IMPLANT
CATH DIAG EXPO 6F AL1 (CATHETERS) IMPLANT
CATH DIAG EXPO 6F VENT PIG 145 (CATHETERS) ×8 IMPLANT
CATH EXTERNAL FEMALE PUREWICK (CATHETERS) IMPLANT
CATH INFINITI 6F AL2 (CATHETERS) IMPLANT
CATH S G BIP PACING (CATHETERS) ×4 IMPLANT
CHLORAPREP W/TINT 26 (MISCELLANEOUS) ×4 IMPLANT
CLIP VESOCCLUDE MED 24/CT (CLIP) IMPLANT
CLIP VESOCCLUDE SM WIDE 24/CT (CLIP) IMPLANT
CLOSURE MYNX CONTROL 6F/7F (Vascular Products) ×4 IMPLANT
CONT SPEC 4OZ CLIKSEAL STRL BL (MISCELLANEOUS) ×8 IMPLANT
COVER BACK TABLE 80X110 HD (DRAPES) ×4 IMPLANT
COVER WAND RF STERILE (DRAPES) IMPLANT
DECANTER SPIKE VIAL GLASS SM (MISCELLANEOUS) ×4 IMPLANT
DERMABOND ADHESIVE PROPEN (GAUZE/BANDAGES/DRESSINGS) ×2
DERMABOND ADVANCED (GAUZE/BANDAGES/DRESSINGS) ×2
DERMABOND ADVANCED .7 DNX12 (GAUZE/BANDAGES/DRESSINGS) ×2 IMPLANT
DERMABOND ADVANCED .7 DNX6 (GAUZE/BANDAGES/DRESSINGS) ×2 IMPLANT
DEVICE CLOSURE PERCLS PRGLD 6F (VASCULAR PRODUCTS) ×4 IMPLANT
DRAPE INCISE IOBAN 66X45 STRL (DRAPES) IMPLANT
DRSG TEGADERM 4X4.75 (GAUZE/BANDAGES/DRESSINGS) ×4 IMPLANT
ELECT CAUTERY BLADE 6.4 (BLADE) IMPLANT
ELECT REM PT RETURN 9FT ADLT (ELECTROSURGICAL) ×8
ELECTRODE REM PT RTRN 9FT ADLT (ELECTROSURGICAL) ×4 IMPLANT
FELT TEFLON 6X6 (MISCELLANEOUS) IMPLANT
GAUZE SPONGE 4X4 12PLY STRL (GAUZE/BANDAGES/DRESSINGS) ×4 IMPLANT
GAUZE SPONGE 4X4 12PLY STRL LF (GAUZE/BANDAGES/DRESSINGS) ×4 IMPLANT
GLOVE BIO SURGEON STRL SZ 6.5 (GLOVE) IMPLANT
GLOVE BIO SURGEON STRL SZ7.5 (GLOVE) ×4 IMPLANT
GLOVE BIO SURGEON STRL SZ8 (GLOVE) ×8 IMPLANT
GLOVE BIO SURGEONS STRL SZ 6.5 (GLOVE)
GLOVE EUDERMIC 7 POWDERFREE (GLOVE) IMPLANT
GLOVE ORTHO TXT STRL SZ7.5 (GLOVE) IMPLANT
GOWN STRL REUS W/ TWL LRG LVL3 (GOWN DISPOSABLE) IMPLANT
GOWN STRL REUS W/ TWL XL LVL3 (GOWN DISPOSABLE) ×2 IMPLANT
GOWN STRL REUS W/TWL LRG LVL3 (GOWN DISPOSABLE)
GOWN STRL REUS W/TWL XL LVL3 (GOWN DISPOSABLE) ×2
GUIDEWIRE SAFE TJ AMPLATZ EXST (WIRE) ×4 IMPLANT
INSERT FOGARTY SM (MISCELLANEOUS) IMPLANT
KIT BASIN OR (CUSTOM PROCEDURE TRAY) ×4 IMPLANT
KIT HEART LEFT (KITS) ×4 IMPLANT
KIT SUCTION CATH 14FR (SUCTIONS) IMPLANT
KIT TURNOVER KIT B (KITS) ×4 IMPLANT
LOOP VESSEL MAXI BLUE (MISCELLANEOUS) IMPLANT
LOOP VESSEL MINI RED (MISCELLANEOUS) IMPLANT
NS IRRIG 1000ML POUR BTL (IV SOLUTION) ×4 IMPLANT
PACK ENDO MINOR (CUSTOM PROCEDURE TRAY) ×4 IMPLANT
PAD ARMBOARD 7.5X6 YLW CONV (MISCELLANEOUS) ×8 IMPLANT
PAD ELECT DEFIB RADIOL ZOLL (MISCELLANEOUS) ×4 IMPLANT
PENCIL BUTTON HOLSTER BLD 10FT (ELECTRODE) IMPLANT
PERCLOSE PROGLIDE 6F (VASCULAR PRODUCTS) ×8
POSITIONER HEAD DONUT 9IN (MISCELLANEOUS) ×4 IMPLANT
SET MICROPUNCTURE 5F STIFF (MISCELLANEOUS) ×4 IMPLANT
SHEATH BRITE TIP 7FR 35CM (SHEATH) ×4 IMPLANT
SHEATH PINNACLE 6F 10CM (SHEATH) ×4 IMPLANT
SHEATH PINNACLE 8F 10CM (SHEATH) ×4 IMPLANT
SLEEVE REPOSITIONING LENGTH 30 (MISCELLANEOUS) ×4 IMPLANT
SPONGE LAP 18X18 X RAY DECT (DISPOSABLE) ×4 IMPLANT
STOPCOCK MORSE 400PSI 3WAY (MISCELLANEOUS) ×8 IMPLANT
SUT ETHIBOND X763 2 0 SH 1 (SUTURE) IMPLANT
SUT GORETEX CV 4 TH 22 36 (SUTURE) IMPLANT
SUT GORETEX CV4 TH-18 (SUTURE) IMPLANT
SUT MNCRL AB 3-0 PS2 18 (SUTURE) IMPLANT
SUT PROLENE 5 0 C 1 36 (SUTURE) IMPLANT
SUT PROLENE 6 0 C 1 30 (SUTURE) IMPLANT
SUT SILK  1 MH (SUTURE) ×2
SUT SILK 1 MH (SUTURE) ×2 IMPLANT
SUT VIC AB 2-0 CT1 27 (SUTURE)
SUT VIC AB 2-0 CT1 TAPERPNT 27 (SUTURE) IMPLANT
SUT VIC AB 2-0 CTX 36 (SUTURE) IMPLANT
SUT VIC AB 3-0 SH 8-18 (SUTURE) IMPLANT
SYR 50ML LL SCALE MARK (SYRINGE) ×4 IMPLANT
SYR BULB IRRIGATION 50ML (SYRINGE) IMPLANT
SYR MEDRAD MARK V 150ML (SYRINGE) ×4 IMPLANT
TOWEL GREEN STERILE (TOWEL DISPOSABLE) ×8 IMPLANT
TRANSDUCER W/STOPCOCK (MISCELLANEOUS) ×8 IMPLANT
TRAY FOLEY SLVR 16FR TEMP STAT (SET/KITS/TRAYS/PACK) IMPLANT
VALVE HEART TRANSCATH SZ3 29MM (Valve) ×4 IMPLANT
WIRE EMERALD 3MM-J .035X150CM (WIRE) ×4 IMPLANT
WIRE EMERALD 3MM-J .035X260CM (WIRE) ×4 IMPLANT

## 2019-04-08 NOTE — Progress Notes (Signed)
  [place] VALVE TEAM  Patient doing well s/p TAVR. He is hemodynamically stable. Groin sites stable. ECG with no high grade block (paced). Arterial line discontinued and transferred  to 4E. Plan for early ambulation after bedrest completed and hopeful discharge over the next 24-48 hours.   Angelena Form PA-C  [facility]  Pager [phone]

## 2019-04-08 NOTE — Progress Notes (Signed)
Site area: rt radial arterial line Site Prior to Removal:  Level 0 Pressure Applied For: 5 minutes Manual:   yes Patient Status During Pull:  stable Post Pull Site:  Level 0 Post Pull Instructions Given:  yes Post Pull Pulses Present: rt radial pulse palpable Dressing Applied:  Gauze and tegaderm Bedrest begins @ NA Comments:

## 2019-04-08 NOTE — Anesthesia Procedure Notes (Signed)
Procedure Name: MAC Date/Time: 04/08/2019 7:21 AM Performed by: Candis Shine, CRNA Pre-anesthesia Checklist: Patient identified, Emergency Drugs available, Suction available, Patient being monitored and Timeout performed Patient Re-evaluated:Patient Re-evaluated prior to induction Oxygen Delivery Method: Simple face mask Dental Injury: Teeth and Oropharynx as per pre-operative assessment

## 2019-04-08 NOTE — CV Procedure (Signed)
[facility]  TAVR OPERATIVE NOTE   Date of Procedure:  04/08/2019  Preoperative Diagnosis: Severe Aortic Stenosis   Postoperative Diagnosis: Same   Procedure:    Transcatheter Aortic Valve Replacement - Transfemoral Approach  Edwards Sapien 3 THV (size 29 mm, model # [id number], serial # [id number])   Co-Surgeons:  Lauree Chandler, MD and Valentina Gu. 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 I saw him as a new consult to discuss TAVR on 02/07/19. He was referred by Dr. Radford Pax to discuss his aortic stenosis and possible TAVR. He has CAD and underwent 4V CABG in 2015. He has had a pacemaker implanted remotely for bradycardia and syncope. His LV systolic function was normal prior to his CABG in 2015 but in January 2016 his LVEF was noted to be 20%. Repeat cath with occlusion of the SVG to the PDA. LVEF in 2019 was 45%. His cardiomyopathy is felt to be non-ischemic.He has paroxysmal atrial fibrillation and is on coumadin. His aortic stenosis has been followed for several years. Most recent echo 01/31/19 showed LVEF=45-50%. There is severe aortic stenosis with mean gradient 33 mmHg, peak gradient 54 mmHg. The dimensionless index is 0.16 and the AVA is 0.85 but these measurements are felt to be exaggerated due to incorrect placement of the pulse doppler sample per Dr. Sallyanne Kuster who read the echo. When I met him last  month he was doing well overall but did describe progressive dyspnea and fatigue. He wished to delay TAVR workup for several months due to planned cataract surgery which he completed in September 2020. Cardiac cath with patent bypass grafts to the LAD, Diagonal and OM with occluded graft to the PDA. Severe disease in the distal RCA treated with a drug eluting stent.   During the course of the patient's preoperative work up they have been evaluated comprehensively by a multidisciplinary team of specialists coordinated through the [facility] in the [facility].  They have been demonstrated to suffer from symptomatic severe aortic stenosis as noted above. The patient has been counseled extensively as to the relative risks and benefits of all options for the treatment of severe aortic stenosis including long term medical therapy, conventional surgery for aortic valve replacement, and transcatheter aortic valve replacement.  The patient has been independently evaluated by Dr. Roxy Manns with CT surgery and they are felt to be at high risk for conventional surgical aortic valve replacement. The surgeon indicated the patient would be a poor candidate for conventional surgery. Based upon review of all of the patient's preoperative diagnostic tests they are felt to be candidate for transcatheter aortic valve replacement using the transfemoral approach as an alternative to high risk conventional surgery.    Following the decision to proceed with transcatheter aortic valve replacement, a discussion has been held regarding what types of management strategies would be attempted intraoperatively in the event of life-threatening complications, including whether or not the patient would be considered a candidate for the use of cardiopulmonary bypass and/or conversion to open  sternotomy for attempted surgical intervention.  The patient has been advised of a variety of complications  that might develop peculiar to this approach including but not limited to risks of death, stroke, paravalvular leak, aortic dissection or other major vascular complications, aortic annulus rupture, device embolization, cardiac rupture or perforation, acute myocardial infarction, arrhythmia, heart block or bradycardia requiring permanent pacemaker placement, congestive heart failure, respiratory failure, renal failure, pneumonia, infection, other late complications related to structural valve deterioration or migration, or other complications that might ultimately cause a temporary or permanent loss of functional independence or other long term morbidity.  The patient provides full informed consent for the procedure as described and all questions were answered preoperatively.    DETAILS OF THE OPERATIVE PROCEDURE  PREPARATION:   The patient is brought to the operating room on the above mentioned date and central monitoring was established by the anesthesia team including placement of a radial arterial line. The patient is placed in the supine position on the operating table.  Intravenous antibiotics are administered. Conscious sedation is used.   Baseline transthoracic echocardiogram was performed. The patient's chest, abdomen, both groins, and both lower extremities are prepared and draped in a sterile manner. A time out procedure is performed.   PERIPHERAL ACCESS:   Using the modified Seldinger technique, femoral arterial and venous access were obtained with placement of 6 Fr sheaths on the left side using u/s guidance.  A pigtail diagnostic catheter was passed through the femoral arterial sheath under fluoroscopic guidance into the aortic root.  A temporary transvenous pacemaker catheter was passed through the femoral venous sheath under fluoroscopic guidance into the right ventricle.  The pacemaker was tested to ensure stable lead placement and pacemaker capture. Aortic root angiography was performed  in order to determine the optimal angiographic angle for valve deployment.  TRANSFEMORAL ACCESS:  A micropuncture kit was used to gain access to the right femoral artery using u/s guidance. Position confirmed with angiography. Pre-closure with double ProGlide closure devices. The patient was heparinized systemically and ACT verified > 250 seconds.    A 16 Fr transfemoral E-sheath was introduced into the right femoral artery after progressively dilating over an Amplatz superstiff wire. An AL-2 catheter was used to direct a straight-tip exchange length wire across the native aortic valve into the left ventricle. This was exchanged out for a pigtail catheter and position was confirmed in the LV apex. Simultaneous LV and Ao pressures were recorded.  The pigtail catheter was then exchanged for an Amplatz Extra-stiff wire in the LV apex.   TRANSCATHETER HEART VALVE DEPLOYMENT:  An Edwards Sapien 3 THV (size 29 mm) was prepared and crimped per manufacturer's guidelines, and the proper orientation of the valve is confirmed on the [employer] delivery system. The valve was advanced through the introducer sheath using normal technique until in an appropriate position in the abdominal aorta beyond the sheath tip. The balloon was then retracted and using the fine-tuning wheel was centered on the valve. The valve was then advanced across the aortic arch using appropriate flexion of the catheter. The valve was carefully positioned across the aortic valve annulus. The Commander catheter was retracted using normal technique. Once final position of the valve has been confirmed by angiographic assessment, the valve is deployed while temporarily holding ventilation and during rapid ventricular pacing to maintain systolic blood pressure < 50 mmHg and pulse pressure < 10 mmHg. The balloon inflation is held for >3 seconds after reaching full deployment volume. Once the  balloon has fully deflated the balloon is retracted  into the ascending aorta and valve function is assessed using TTE. There is felt to be no paravalvular leak and no central aortic insufficiency.  The patient's hemodynamic recovery following valve deployment is good.  The deployment balloon and guidewire are both removed. Echo demostrated acceptable post-procedural gradients, stable mitral valve function, and no AI.   PROCEDURE COMPLETION:  The sheath was then removed and closure devices were completed. Protamine was administered once femoral arterial repair was complete. The temporary pacemaker, pigtail catheters and femoral sheaths were removed with Mynx closure device placed in the right femoral artery. Manual pressure used for hemostasis post venous sheath pull.    The patient tolerated the procedure well and is transported to the surgical intensive care in stable condition. There were no immediate intraoperative complications. All sponge instrument and needle counts are verified correct at completion of the operation.   No blood products were administered during the operation.  The patient received a total of  89 mL of intravenous contrast during the procedure.  Lauree Chandler MD 04/08/2019 9:26 AM

## 2019-04-08 NOTE — Anesthesia Procedure Notes (Signed)
Arterial Line Insertion Start/End11/02/2019 7:02 AM Performed by: Candis Shine, CRNA, CRNA  Patient location: Pre-op. Preanesthetic checklist: patient identified, IV checked, site marked, risks and benefits discussed, surgical consent, monitors and equipment checked, pre-op evaluation, timeout performed and anesthesia consent Lidocaine 1% used for infiltration Right, radial was placed Catheter size: 20 G Hand hygiene performed  and maximum sterile barriers used   Attempts: 1 Procedure performed without using ultrasound guided technique. Following insertion, dressing applied and Biopatch. Post procedure assessment: normal and unchanged  Patient tolerated the procedure well with no immediate complications. Additional procedure comments: Placed by Lawrence Marseilles.

## 2019-04-08 NOTE — Anesthesia Postprocedure Evaluation (Signed)
Anesthesia Post Note  Patient: Brandon Klein  Procedure(s) Performed: TRANSCATHETER AORTIC VALVE REPLACEMENT, TRANSFEMORAL (N/A Chest) TRANSESOPHAGEAL ECHOCARDIOGRAM (TEE) (N/A )     Patient location during evaluation: PACU Anesthesia Type: MAC Level of consciousness: awake Pain management: pain level controlled Vital Signs Assessment: post-procedure vital signs reviewed and stable Respiratory status: spontaneous breathing Cardiovascular status: stable Postop Assessment: no apparent nausea or vomiting Anesthetic complications: no    Last Vitals:  Vitals:   04/08/19 1251 04/08/19 1400  BP: 108/63 99/83  Pulse: 60 62  Resp: 16 16  Temp: 36.5 C   SpO2: 96% 97%    Last Pain:  Vitals:   04/08/19 1251  TempSrc: Oral  PainSc: 0-No pain                 Laquanda Bick

## 2019-04-08 NOTE — Progress Notes (Signed)
Pt arrived from cath lab. Pt C/A/ox4. Bilateral groins level 0. Pt denies complaints. Vitals stable. CHG bath given. Telebox applied/ccmd notified. Pt oriented to room and call bell within reach.  Jerald Kief, RN

## 2019-04-08 NOTE — Interval H&P Note (Signed)
History and Physical Interval Note:  04/08/2019 7:44 AM  Brandon Klein  has presented today for surgery, with the diagnosis of Severe Aortic Stenosis.  The various methods of treatment have been discussed with the patient and family. After consideration of risks, benefits and other options for treatment, the patient has consented to  Procedure(s): TRANSCATHETER AORTIC VALVE REPLACEMENT, TRANSFEMORAL (N/A) TRANSESOPHAGEAL ECHOCARDIOGRAM (TEE) (N/A) as a surgical intervention.  The patient's history has been reviewed, patient examined, no change in status, stable for surgery.  I have reviewed the patient's chart and labs.  Questions were answered to the patient's satisfaction.     Lauree Chandler

## 2019-04-08 NOTE — Transfer of Care (Signed)
Immediate Anesthesia Transfer of Care Note  Patient: DAMIER DISANO  Procedure(s) Performed: TRANSCATHETER AORTIC VALVE REPLACEMENT, TRANSFEMORAL (N/A Chest) TRANSESOPHAGEAL ECHOCARDIOGRAM (TEE) (N/A )  Patient Location: Cath Lab  Anesthesia Type:MAC  Level of Consciousness: awake, alert  and oriented  Airway & Oxygen Therapy: Patient Spontanous Breathing and Patient connected to nasal cannula oxygen  Post-op Assessment: Report given to RN and Post -op Vital signs reviewed and stable  Post vital signs: Reviewed and stable  Last Vitals:  Vitals Value Taken Time  BP 97/66 04/08/19 0945  Temp 36.4 C 04/08/19 0938  Pulse 60 04/08/19 0947  Resp 14 04/08/19 0947  SpO2 95 % 04/08/19 0947  Vitals shown include unvalidated device data.  Last Pain:  Vitals:   04/08/19 0938  TempSrc: Temporal  PainSc: 0-No pain         Complications: No apparent anesthesia complications

## 2019-04-08 NOTE — Op Note (Signed)
[facility]   MULTIDISCIPLINARY HEART VALVE TEAM   TAVR OPERATIVE NOTE   Date of Procedure:  04/08/2019  Preoperative Diagnosis: Severe Aortic Stenosis   Postoperative Diagnosis: Same   Procedure:    Transcatheter Aortic Valve Replacement - Percutaneous Right Transfemoral Approach  Edwards Sapien 3 THV (size 29 mm, model # [id number], serial # [id number])   Co-Surgeons:  Salvatore Decent. Cornelius Moras, MD and Verne Carrow, MD  Anesthesiologist:  Dorris Singh, MD  Echocardiographer:  Charlton Haws, MD  Pre-operative Echo Findings:  Severe aortic stenosis  Normal left ventricular systolic function  Post-operative Echo Findings:  No paravalvular leak  Normal left ventricular systolic function   BRIEF CLINICAL NOTE AND INDICATIONS FOR SURGERY  Patient is 78 year old obese male with history of aortic stenosis, coronary artery disease status post coronary artery bypass grafting x4 in 2015, chronic combined systolic and diastolic congestive heart failure, symptomatic bradycardia and recurrent paroxysmal atrial fibrillation status post permanent pacemaker in the remote past on long-term warfarin anticoagulation, hypertension, hyperlipidemia, borderline type 2 diabetes mellitus, stage II chronic kidney disease, and obstructive sleep apnea who has been referred for surgical consultation to discuss treatment options for management of severe symptomatic aortic stenosis.  Patient's cardiac history dates back more than 20 years ago when he first developed paroxysmal atrial fibrillation and symptomatic tachycardia.  He underwent permanent pacemaker placement and has since then undergone generator change at end-of-life.  He has been on long-term warfarin anticoagulation for many years.  Interrogation of his pacemaker demonstrates that he remains in atrial paced rhythm much of the time with occasional episodes of paroxysmal atrial fibrillation and PVCs, none of which are symptomatic.   He developed coronary artery disease and initially was treated with PCI and stenting but later underwent coronary artery bypass grafting x4 in 2015.  Early following his surgery he was noted to have severe left ventricular systolic dysfunction with ejection fraction estimated only 20 to 25%.  This gradually improved over time.  He was noted to have aortic stenosis which was initially felt to be mild and has gradually progressed in severity.  Recent follow-up echocardiogram performed January 31, 2019 revealed stable left ventricular systolic function with ejection fraction estimated 45 to 50%.  There was severe aortic stenosis with peak velocity across aortic valve reported 3.7 m/s corresponding to mean transvalvular gradient of 33 mmHg but aortic valve area calculated only 0.90 cm with DVI notably only 0.16.  The patient was referred to the multidisciplinary heart valve clinic and has been evaluated previously by Dr. Clifton Bertha.  He underwent diagnostic cardiac catheterization on March 21, 2019 which revealed stable coronary anatomy with severe three-vessel coronary artery disease but continued patency of 3 out of the 4 previously placed bypass grafts including the left internal mammary artery graft to the distal left anterior descending coronary artery and saphenous vein grafts to the diagonal branch and the obtuse marginal branch of the left circumflex system.  There was chronic occlusion of the saphenous vein graft to the posterior descending coronary artery.  The patient was noted to have severe stenosis of distal right coronary artery and underwent successful PCI and stenting of the native distal right coronary artery at that time.  Peak to peak and mean transvalvular gradients across the aortic valve were measured 32 and 23.3 mmHg, respectively.  Pulmonary artery pressures were mildly elevated.  CT angiography was performed and the patient referred for surgical consultation.  During the course of the  patient's preoperative work up they have  been evaluated comprehensively by a multidisciplinary team of specialists coordinated through the [facility] in the [facility].  They have been demonstrated to suffer from symptomatic severe aortic stenosis as noted above. The patient has been counseled extensively as to the relative risks and benefits of all options for the treatment of severe aortic stenosis including long term medical therapy, conventional surgery for aortic valve replacement, and transcatheter aortic valve replacement.  All questions have been answered, and the patient provides full informed consent for the operation as described.   DETAILS OF THE OPERATIVE PROCEDURE  PREPARATION:    The patient is brought to the operating room on the above mentioned date and appropriate monitoring was established by the anesthesia team. The patient is placed in the supine position on the operating table.  Intravenous antibiotics are administered. The patient is monitored closely throughout the procedure under conscious sedation.  Baseline transthoracic echocardiogram was performed. The patient's chest, abdomen, both groins, and both lower extremities are prepared and draped in a sterile manner. A time out procedure is performed.   PERIPHERAL ACCESS:    Using the modified Seldinger technique, femoral arterial and venous access was obtained with placement of 6 Fr sheaths on the left side.  A pigtail diagnostic catheter was passed through the left arterial sheath under fluoroscopic guidance into the aortic root.  A temporary transvenous pacemaker catheter was passed through the left femoral venous sheath under fluoroscopic guidance into the right ventricle.  The pacemaker was tested to ensure stable lead placement and pacemaker capture. Aortic root angiography was performed in order to determine the optimal angiographic angle for valve  deployment.   TRANSFEMORAL ACCESS:   Percutaneous transfemoral access and sheath placement was performed using ultrasound guidance.  The right common femoral artery was cannulated using a micropuncture needle and appropriate location was verified using hand injection angiogram.  A pair of Abbott Perclose percutaneous closure devices were placed and a 6 French sheath replaced into the femoral artery.  The patient was heparinized systemically and ACT verified > 250 seconds.    A 16 Fr transfemoral E-sheath was introduced into the right common femoral artery after progressively dilating over an Amplatz superstiff wire. An AL-2 catheter was used to direct a straight-tip exchange length wire across the native aortic valve into the left ventricle. This was exchanged out for a pigtail catheter and position was confirmed in the LV apex. Simultaneous LV and Ao pressures were recorded.  The pigtail catheter was exchanged for an Amplatz Extra-stiff wire in the LV apex.  Echocardiography was utilized to confirm appropriate wire position and no sign of entanglement in the mitral subvalvular apparatus.    TRANSCATHETER HEART VALVE DEPLOYMENT:   An Edwards Sapien 3 transcatheter heart valve (size 29 mm, model #[id number], serial #[id number]) was prepared and crimped per manufacturer's guidelines, and the proper orientation of the valve is confirmed on the [employer] delivery system. The valve was advanced through the introducer sheath using normal technique until in an appropriate position in the abdominal aorta beyond the sheath tip. The balloon was then retracted and using the fine-tuning wheel was centered on the valve. The valve was then advanced across the aortic arch using appropriate flexion of the catheter. The valve was carefully positioned across the aortic valve annulus. The Commander catheter was retracted using normal technique. Once final position of the valve has been confirmed by angiographic  assessment, the valve is deployed while temporarily holding ventilation and during  rapid ventricular pacing to maintain systolic blood pressure < 50 mmHg and pulse pressure < 10 mmHg. The balloon inflation is held for >3 seconds after reaching full deployment volume. Once the balloon has fully deflated the balloon is retracted into the ascending aorta and valve function is assessed using echocardiography. There is felt to be no paravalvular leak and no central aortic insufficiency.  The patient's hemodynamic recovery following valve deployment is good.  The deployment balloon and guidewire are both removed.    PROCEDURE COMPLETION:   The sheath was removed and femoral artery closure performed.  Protamine was administered once femoral arterial repair was complete. The temporary pacemaker, pigtail catheters and femoral sheaths were removed with manual pressure used for hemostasis.  A Mynx femoral closure device was utilized following removal of the diagnostic sheath in the left femoral artery.  The patient tolerated the procedure well and is transported to the surgical intensive care in stable condition. There were no immediate intraoperative complications. All sponge instrument and needle counts are verified correct at completion of the operation.   No blood products were administered during the operation.  The patient received a total of 89 mL of intravenous contrast during the procedure.   Purcell Nails, MD 04/08/2019 9:17 AM

## 2019-04-08 NOTE — Anesthesia Postprocedure Evaluation (Signed)
Anesthesia Post Note  Patient: Brandon Klein  Procedure(s) Performed: TRANSCATHETER AORTIC VALVE REPLACEMENT, TRANSFEMORAL (N/A Chest) TRANSESOPHAGEAL ECHOCARDIOGRAM (TEE) (N/A )     Anesthesia Post Evaluation  Last Vitals:  Vitals:   04/08/19 0601  BP: 130/72  Pulse: 72  Resp: 18  Temp: 36.9 C  SpO2: 97%    Last Pain:  Vitals:   04/08/19 0616  TempSrc:   PainSc: 0-No pain                 Karita Dralle

## 2019-04-08 NOTE — Progress Notes (Signed)
  Echocardiogram 2D Echocardiogram has been performed.  Brandon Klein 04/08/2019, 9:12 AM

## 2019-04-09 ENCOUNTER — Inpatient Hospital Stay (HOSPITAL_COMMUNITY): Payer: Medicare Other

## 2019-04-09 ENCOUNTER — Encounter (HOSPITAL_COMMUNITY): Payer: Self-pay | Admitting: Cardiovascular Disease

## 2019-04-09 DIAGNOSIS — Z952 Presence of prosthetic heart valve: Secondary | ICD-10-CM

## 2019-04-09 DIAGNOSIS — I350 Nonrheumatic aortic (valve) stenosis: Secondary | ICD-10-CM

## 2019-04-09 LAB — BASIC METABOLIC PANEL
Anion gap: 9 (ref 5–15)
BUN: 22 mg/dL (ref 8–23)
CO2: 24 mmol/L (ref 22–32)
Calcium: 8.9 mg/dL (ref 8.9–10.3)
Chloride: 103 mmol/L (ref 98–111)
Creatinine, Ser: 1.26 mg/dL — ABNORMAL HIGH (ref 0.61–1.24)
GFR calc Af Amer: >60 mL/min (ref >60)
GFR calc non Af Amer: 54 mL/min — ABNORMAL LOW (ref >60)
Glucose, Bld: 118 mg/dL — ABNORMAL HIGH (ref 70–99)
Potassium: 4.1 mmol/L (ref 3.5–5.1)
Sodium: 136 mmol/L (ref 135–145)

## 2019-04-09 LAB — MAGNESIUM: Magnesium: 2.1 mg/dL (ref 1.7–2.4)

## 2019-04-09 LAB — CBC
HCT: 42.2 % (ref 39.0–52.0)
Hemoglobin: 14.4 g/dL (ref 13.0–17.0)
MCH: 31 pg (ref 26.0–34.0)
MCHC: 34.1 g/dL (ref 30.0–36.0)
MCV: 90.9 fL (ref 80.0–100.0)
Platelets: 114 10*3/uL — ABNORMAL LOW (ref 150–400)
RBC: 4.64 MIL/uL (ref 4.22–5.81)
RDW: 13.3 % (ref 11.5–15.5)
WBC: 7.7 10*3/uL (ref 4.0–10.5)
nRBC: 0 % (ref 0.0–0.2)

## 2019-04-09 LAB — ECHOCARDIOGRAM COMPLETE
Height: 67 in
Weight: 3844.82 oz

## 2019-04-09 MED ORDER — ISOSORBIDE MONONITRATE ER 30 MG PO TB24
30.0000 mg | ORAL_TABLET | Freq: Every day | ORAL | Status: DC
  Administered 2019-04-09: 30 mg via ORAL
  Filled 2019-04-09: qty 1

## 2019-04-09 MED ORDER — SPIRONOLACTONE 25 MG PO TABS
25.0000 mg | ORAL_TABLET | Freq: Every day | ORAL | Status: DC
  Administered 2019-04-09: 25 mg via ORAL
  Filled 2019-04-09: qty 1

## 2019-04-09 MED ORDER — HYDRALAZINE HCL 25 MG PO TABS
25.0000 mg | ORAL_TABLET | Freq: Three times a day (TID) | ORAL | Status: DC
  Administered 2019-04-09: 25 mg via ORAL
  Filled 2019-04-09: qty 1

## 2019-04-09 MED ORDER — ASPIRIN 81 MG PO TBEC: 81.0000 mg | DELAYED_RELEASE_TABLET | Freq: Every day | ORAL | Status: AC

## 2019-04-09 MED ORDER — FUROSEMIDE 20 MG PO TABS
20.0000 mg | ORAL_TABLET | Freq: Two times a day (BID) | ORAL | Status: DC
  Administered 2019-04-09: 20 mg via ORAL
  Filled 2019-04-09: qty 1

## 2019-04-09 MED ORDER — POTASSIUM CHLORIDE CRYS ER 20 MEQ PO TBCR
40.0000 meq | EXTENDED_RELEASE_TABLET | Freq: Two times a day (BID) | ORAL | Status: DC
  Administered 2019-04-09: 40 meq via ORAL
  Filled 2019-04-09: qty 2

## 2019-04-09 MED FILL — Magnesium Sulfate Inj 50%: INTRAMUSCULAR | Qty: 10 | Status: AC

## 2019-04-09 MED FILL — Heparin Sodium (Porcine) Inj 1000 Unit/ML: INTRAMUSCULAR | Qty: 30 | Status: AC

## 2019-04-09 MED FILL — Potassium Chloride Inj 2 mEq/ML: INTRAVENOUS | Qty: 40 | Status: AC

## 2019-04-09 NOTE — Progress Notes (Signed)
  Echocardiogram 2D Echocardiogram has been performed.  Brandon Klein 04/09/2019, 10:43 AM

## 2019-04-09 NOTE — Progress Notes (Signed)
Discharge instructions given. Answered pt's questions and pt verbalized understanding. IVs removed and disconnected from cardiac monitor. No s/s of distress at this time. Awaiting pt's wife for discharge.

## 2019-04-09 NOTE — Discharge Instructions (Signed)
ACTIVITY AND EXERCISE °• Daily activity and exercise are an important part of your recovery. People recover at different rates depending on their general health and type of valve procedure. °• Most people recovering from TAVR feel better relatively quickly  °• No lifting, pushing, pulling more than 10 pounds (examples to avoid: groceries, vacuuming, gardening, golfing): °            - For one week with a procedure through the groin. °            - For six weeks for procedures through the chest wall or neck. °NOTE: You will typically see one of our providers 7-14 days after your procedure to discuss WHEN TO RESUME the above activities.  °  °  °DRIVING °• Do not drive until you are seen for follow up and cleared by a provider. Generally, we ask patient to not drive for 1 week after their procedure. °• If you have been told by your doctor in the past that you may not drive, you must talk with him/her before you begin driving again. °  °DRESSING °• Groin site: you may leave the clear dressing over the site for up to one week or until it falls off. °  °HYGIENE °• If you had a femoral (leg) procedure, you may take a shower when you return home. After the shower, pat the site dry. Do NOT use powder, oils or lotions in your groin area until the site has completely healed. °• If you had a chest procedure, you may shower when you return home unless specifically instructed not to by your discharging practitioner. °            - DO NOT scrub incision; pat dry with a towel. °            - DO NOT apply any lotions, oils, powders to the incision. °            - No tub baths / swimming for at least 2 weeks. °• If you notice any fevers, chills, increased pain, swelling, bleeding or pus, please contact your doctor. °  °ADDITIONAL INFORMATION °• If you are going to have an upcoming dental procedure, please contact our office as you will require antibiotics ahead of time to prevent infection on your heart valve.  ° ° °If you have any  questions or concerns you can call the structural heart phone during normal business hours 8am-4pm. If you have an urgent need after hours or weekends please call [phone] to talk to the on call provider for general cardiology. If you have an emergency that requires immediate attention, please call 911.  ° ° °After TAVR Checklist ° °Check  Test Description  ° Follow up appointment in 1-2 weeks  You will see our structural heart physician assistant, Katie Brayln Duque. Your incision sites will be checked and you will be cleared to drive and resume all normal activities if you are doing well.    ° 1 month echo and follow up  You will have an echo to check on your new heart valve and be seen back in the office by Katie Beautiful Pensyl. Many times the echo is not read by your appointment time, but Katie will call you later that day or the following day to report your results.  ° Follow up with your primary cardiologist You will need to be seen by your primary cardiologist in the following 3-6 months after your 1 month appointment in the valve   clinic. Often times your Plavix or Aspirin will be discontinued during this time, but this is decided on a case by case basis.   ° 1 year echo and follow up You will have another echo to check on your heart valve after 1 year and be seen back in the office by Katie Venie Montesinos. This your last structural heart visit.  ° Bacterial endocarditis prophylaxis  You will have to take antibiotics for the rest of your life before all dental procedures (even teeth cleanings) to protect your heart valve. Antibiotics are also required before some surgeries. Please check with your cardiologist before scheduling any surgeries. Also, please make sure to tell us if you have a penicillin allergy as you will require an alternative antibiotic.   ° ° °

## 2019-04-09 NOTE — Progress Notes (Signed)
CARDIAC REHAB PHASE I   PRE:  Rate/Rhythm: 78 SR  BP:  Supine: 149/70  Sitting:   Standing:    SaO2:   MODE:  Ambulation: 350 ft   POST:  Rate/Rhythm: 94 SR  BP:  Supine:   Sitting: 136/69  Standing:    SaO2: 96%RA [id number] Pt walked 350 ft on RA with rolling walker with steady gait. Tolerated well. Sats good on RA. Encouraged pt to walk as tolerated. 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   Graylon Good, RN BSN  04/09/2019 9:40 AM

## 2019-04-09 NOTE — Discharge Summary (Addendum)
[facility]   MULTIDISCIPLINARY HEART VALVE TEAM  Discharge Summary    Patient ID: Brandon Klein MRN: [id number]; DOB: September 09, 1940  Admit date: 04/08/2019 Discharge date: 04/09/2019  Primary Care Provider: Juluis Rainier, MD  Primary Cardiologist: Armanda Magic, MD / Dr. Clifton Fain & Dr. Cornelius Moras (TAVR)  Discharge Diagnoses    Principal Problem:   S/P TAVR (transcatheter aortic valve replacement) Active Problems:   Bradycardia s/p MDT pacemaker   Dyslipidemia   PAF (paroxysmal atrial fibrillation) (HCC)   Obesity (BMI 30-39.9)   OSA (obstructive sleep apnea)   Chronic combined systolic and diastolic CHF (congestive heart failure) (HCC)   History of GI bleed 2/2 duodenal polyp   Type 2 diabetes mellitus without complications (HCC)   Hypertension   CAD (coronary artery disease)   GERD (gastroesophageal reflux disease)   Chronic renal insufficiency   S/P CABG x 4   Allergies Allergies  Allergen Reactions   Acetaminophen-Codeine Other (See Comments)    Extreme constipation Extreme constipation    Amoxicillin Itching and Rash    Did it involve swelling of the face/tongue/throat, SOB, or low BP? No Did it involve sudden or severe rash/hives, skin peeling, or any reaction on the inside of your mouth or nose? Unknown  Did you need to seek medical attention at a hospital or doctor's office? No When did it last happen?More than 40 years ago  If all above answers are "NO", may proceed with cephalosporin use.    Diovan [Valsartan] Other (See Comments)    Unknown     Diagnostic Studies/Procedures    TAVR OPERATIVE NOTE   Date of Procedure:                04/08/2019  Preoperative Diagnosis:      Severe Aortic Stenosis   Postoperative Diagnosis:    Same   Procedure:        Transcatheter Aortic Valve Replacement - Percutaneous Right Transfemoral Approach             Edwards Sapien 3 THV (size 29 mm, model # 9600TFX, serial # [id number])               Co-Surgeons:                        Salvatore Decent. Cornelius Moras, MD and Verne Carrow, MD  Anesthesiologist:                  Dorris Singh, MD  Echocardiographer:              Charlton Haws, MD  Pre-operative Echo Findings: ? Severe aortic stenosis ? Normal left ventricular systolic function  Post-operative Echo Findings: ? No paravalvular leak ? Normal left ventricular systolic function  _____________    Echo 04/09/19: completed but pending formal read at the time of discharge     History of Present Illness     Brandon Klein is a 78 y.o. male with a history of carotid artery disease, CAD s/p 4V CABG (2015 PVT) s/p PCI to dRCA (10/23), chronic systolic CHF, NICM, HTN, HLD,borderlineDMon no therapy, PAF on coumadin, CKD stage II,sleep apnea,bradycardias/p PPM and severe AS who presented to [facility] on 04/08/19 for planned TAVR.   Patient's cardiac history dates back more than 20 years ago when he first developed paroxysmal atrial fibrillation and symptomatic tachycardia.  He underwent permanent pacemaker placement and has since then undergone generator change at end-of-life.  He has been on long-term  warfarin anticoagulation for many years.  Interrogation of his pacemaker demonstrates that he remains in atrial paced rhythm much of the time with occasional episodes of paroxysmal atrial fibrillation and PVCs, none of which are symptomatic.  He developed coronary artery disease and initially was treated with PCI and stenting but later underwent coronary artery bypass grafting x4 in 2015.  Early following his surgery he was noted to have severe left ventricular systolic dysfunction with ejection fraction estimated only 20 to 25%.  This gradually improved over time.  He was noted to have aortic stenosis which was initially felt to be mild and has gradually progressed in severity.  Recent follow-up echocardiogram performed January 31, 2019 revealed stable left ventricular systolic  function with ejection fraction estimated 45 to 50%.  There was severe aortic stenosis with peak velocity across aortic valve reported 3.7 m/s corresponding to mean transvalvular gradient of 33 mmHg but aortic valve area calculated only 0.90 cm with DVI notably only 0.16.  The patient was referred to the multidisciplinary heart valve clinic and has been evaluated previously by Dr. Clifton Sholom.  He underwent diagnostic cardiac catheterization on March 21, 2019 which revealed stable coronary anatomy with severe three-vessel coronary artery disease but continued patency of 3 out of the 4 previously placed bypass grafts including the left internal mammary artery graft to the distal left anterior descending coronary artery and saphenous vein grafts to the diagonal branch and the obtuse marginal branch of the left circumflex system.  There was chronic occlusion of the saphenous vein graft to the posterior descending coronary artery. The patient was noted to have severe stenosis of distal right coronary artery and underwent successful PCI and stenting of the native distal right coronary artery at that time.   The patient has been evaluated by the multidisciplinary valve team and felt to have severe, symptomatic aortic stenosis and to be a suitable candidate for TAVR, which was set up for 04/08/19.    Hospital Course     Consultants: none  Severe AS: s/p successful TAVR with a 29 mm Edwards Sapien 3 THV via the TF approach on 04/08/19. Post operative echo completed but pending formal read. Groin sites are stable. ECG with sinus and 1st deg AV block (has PPM). Will resume Coumadin at home dosing tonight. I have arranged for a coumadin clinic appointment after his follow up with Dr. Clifton Aleks on 11/20. He will continue on concurrent DAPT for 1 month after his stent placement on 10/23. The patient has been instructed to stop aspirin on 04/21/19.   CAD: pre TAVR cath showed severe triple vessel CAD s/p 4V CABG with  3/4 patent bypass grafts. There was severe stenosis distal RCA s/p successful PTCA/DES x 1 distal RCA. Continue medical therapy. As above, continue triple therapy x 1 month, then drop aspirin.   PAF: maintaining sinus during admission. Resumed on home Coumadin   Chronic combined S/D CHF: appears euvolemic. Pre admission lab work BNP was in the normal range. Will resume home lasix 100mg  BID, coreg, hydralazine/nitrate combo and spiro.   CKD stage III: creat has remained stable.   HTN: BP elevated, plan to resume home meds.  _____________  Discharge Vitals Blood pressure (!) 149/70, pulse 78, temperature 98.1 F (36.7 C), temperature source Oral, resp. rate 16, height 5\' 7"  (1.702 m), weight 109 kg, SpO2 99 %.  Filed Weights   04/08/19 0601 04/09/19 0500  Weight: 106.6 kg 109 kg    GEN: Well nourished, well developed, in no acute  distress HEENT: normal Neck: no JVD or masses Cardiac: RRR; no murmurs, rubs, or gallops,no edema  Respiratory:  clear to auscultation bilaterally, normal work of breathing GI: soft, nontender, nondistended, + BS MS: no deformity or atrophy Skin: warm and dry, no rash.  Groin sites clear without hematoma or ecchymosis  Neuro:  Alert and Oriented x 3, Strength and sensation are intact Psych: euthymic mood, full affect   Labs & Radiologic Studies    CBC Recent Labs    04/08/19 1034 04/09/19 0232  WBC  --  7.7  HGB 11.6* 14.4  HCT 34.0* 42.2  MCV  --  90.9  PLT  --  625*   Basic Metabolic Panel Recent Labs    04/08/19 1034 04/09/19 0232  NA 142 136  K 3.2* 4.1  CL 103 103  CO2  --  24  GLUCOSE 120* 118*  BUN 18 22  CREATININE 0.70 1.26*  CALCIUM  --  8.9  MG  --  2.1   Liver Function Tests No results for input(s): AST, ALT, ALKPHOS, BILITOT, PROT, ALBUMIN in the last 72 hours. No results for input(s): LIPASE, AMYLASE in the last 72 hours. Cardiac Enzymes No results for input(s): CKTOTAL, CKMB, CKMBINDEX, TROPONINI in the last 72  hours. BNP Invalid input(s): POCBNP D-Dimer No results for input(s): DDIMER in the last 72 hours. Hemoglobin A1C No results for input(s): HGBA1C in the last 72 hours. Fasting Lipid Panel No results for input(s): CHOL, HDL, LDLCALC, TRIG, CHOLHDL, LDLDIRECT in the last 72 hours. Thyroid Function Tests No results for input(s): TSH, T4TOTAL, T3FREE, THYROIDAB in the last 72 hours.  Invalid input(s): FREET3 _____________  Dg Chest 2 View  Result Date: 04/04/2019 CLINICAL DATA:  Preop TAVR. EXAM: CHEST - 2 VIEW COMPARISON:  05/09/2018 FINDINGS: Sternotomy wires and left-sided pacemaker unchanged. Lungs are adequately inflated without consolidation or effusion. Cardiomediastinal silhouette and remainder of the exam is unchanged. IMPRESSION: No active cardiopulmonary disease. Electronically Signed   By: Marin Olp M.D.   On: 04/04/2019 15:42   Ct Coronary Morph W/cta Cor W/score W/ca W/cm &/or Wo/cm  Addendum Date: 03/27/2019   ADDENDUM REPORT: 03/27/2019 14:21 CLINICAL DATA:  78 year old male with severe aortic stenosis being evaluated for a TAVR procedure. EXAM: Cardiac TAVR CT TECHNIQUE: The patient was scanned on a [employer]. A 120 kV retrospective scan was triggered in the descending thoracic aorta at 111 HU's. Gantry rotation speed was 250 msecs and collimation was .6 mm. No beta blockade or nitro were given. The 3D data set was reconstructed in 5% intervals of the R-R cycle. Systolic and diastolic phases were analyzed on a dedicated work station using MPR, MIP and VRT modes. The patient received 80 cc of contrast. FINDINGS: Aortic Valve: Trileaflet aortic valve with severely thickened and calcified leaflets and severely restricted leaflet openings. Only minimal calcifications are extending into the LVOT under the non-coronary leaflet. Aorta: Normal size with minimal atherosclerotic plaque and calcifications and no dissection. Sinotubular Junction: 30 x 28 mm Ascending Thoracic  Aorta: 34 x 34 mm Aortic Arch: 28 x 26 mm Descending Thoracic Aorta: 25 x 24 mm Sinus of Valsalva Measurements: Sinus are relatively shallow for annular size. Non-coronary: 33 mm Right -coronary: 33 mm Left -coronary: 33 mm Coronary Artery Height above Annulus: Left Main: 12 mm Right Coronary: 16 mm Virtual Basal Annulus Measurements: Maximum/Minimum Diameter: 29.3 x 26.2 mm Mean Diameter: 27.7 mm Perimeter: 89.5 mm Area: 604 mm2 Optimum Fluoroscopic Angle for Delivery: LAO 4  CAU 3 IMPRESSION: 1. Trileaflet aortic valve with severely thickened and calcified leaflets and severely restricted leaflet openings. Only minimal calcifications are extending into the LVOT under the non-coronary leaflet. Aortic valve calcium score 2974 consistent with severe aortic stenosis. Annular measurements suitable for delivery of a 29 mm Edwards-SAPIEN 3 valve. 2. Sufficient coronary to annulus distance. 3. Optimum Fluoroscopic Angle for Delivery: LAO 4 CAU 3. 4. No thrombus in the left atrial appendage. Electronically Signed   By: Tobias Alexander   On: 03/27/2019 14:21   Result Date: 03/27/2019 EXAM: OVER-READ INTERPRETATION  CT CHEST The following report is an over-read performed by radiologist Dr. Trudie Reed of [facility] on 03/27/2019. This over-read does not include interpretation of cardiac or coronary anatomy or pathology. The coronary calcium score/coronary CTA interpretation by the cardiologist is attached. COMPARISON:  None. FINDINGS: Extracardiac findings will be described separately under dictation for contemporaneously obtained CTA chest, abdomen and pelvis. Please see IMPRESSION: Separate dictation for contemporaneously obtained CTA chest, abdomen and pelvis dated 03/27/2019 for full description of relevant extracardiac findings. Electronically Signed: By: Trudie Reed M.D. On: 03/27/2019 11:54   Dg Chest Port 1 View  Result Date: 04/08/2019 CLINICAL DATA:  Status post transcatheter aortic  valve repair. EXAM: PORTABLE CHEST 1 VIEW COMPARISON:  April 04, 2019. FINDINGS: The heart size and mediastinal contours are within normal limits. Left-sided pacemaker is unchanged in position. Aortic valve prosthesis is noted. Sternotomy wires are noted. No pneumothorax or pleural effusion is noted. Both lungs are clear. The visualized skeletal structures are unremarkable. IMPRESSION: No acute cardiopulmonary abnormality seen. Electronically Signed   By: Lupita Raider M.D.   On: 04/08/2019 13:05   Ct Angio Chest Aorta W &/or Wo Contrast  Result Date: 03/27/2019 CLINICAL DATA:  78 year old male with history of severe aortic valve stenosis. Preprocedural study prior to potential transcatheter aortic valve replacement (TAVR) procedure. EXAM: CT ANGIOGRAPHY CHEST, ABDOMEN AND PELVIS TECHNIQUE: Multidetector CT imaging through the chest, abdomen and pelvis was performed using the standard protocol during bolus administration of intravenous contrast. Multiplanar reconstructed images and MIPs were obtained and reviewed to evaluate the vascular anatomy. CONTRAST:  OMNIPAQUE IOHEXOL 350 MG/ML SOLN COMPARISON:  Chest CTA 04/03/2014. FINDINGS: CTA CHEST FINDINGS Cardiovascular: Heart size is borderline enlarged. There is no significant pericardial fluid, thickening or pericardial calcification. There is aortic atherosclerosis, as well as atherosclerosis of the great vessels of the mediastinum and the coronary arteries, including calcified atherosclerotic plaque in the left main, left anterior descending, left circumflex and right coronary arteries. Status post median sternotomy for CABG including LIMA to the LAD. Severe thickening calcification of the aortic valve. Left-sided pacemaker device in place with lead tips terminating in the right atrium and right ventricular apex. Mediastinum/Lymph Nodes: No pathologically enlarged mediastinal or hilar lymph nodes. Please note that accurate exclusion of hilar  adenopathy is limited on noncontrast CT scans. Esophagus is unremarkable in appearance. No axillary lymphadenopathy. Lungs/Pleura: No suspicious appearing pulmonary nodules or masses are noted. No acute consolidative airspace disease. No pleural effusions. Musculoskeletal/Soft Tissues: Median sternotomy wires. There are no aggressive appearing lytic or blastic lesions noted in the visualized portions of the skeleton. CTA ABDOMEN AND PELVIS FINDINGS Hepatobiliary: No suspicious cystic or solid hepatic lesions. No intra or extrahepatic biliary ductal dilatation. Status post cholecystectomy. Pancreas: No pancreatic mass. No pancreatic ductal dilatation. No pancreatic or peripancreatic fluid collections or inflammatory changes. Spleen: Unremarkable. Adrenals/Urinary Tract: Multiple low-attenuation lesions are noted in both kidneys, compatible with  simple cysts, largest of which is in the medial aspect of the upper pole the right kidney measuring 3 cm. Mild multifocal cortical thinning in both kidneys. No hydroureteronephrosis. Urinary bladder is normal in appearance. Bilateral adrenal glands are normal in appearance. Stomach/Bowel: Normal appearance of the stomach. No pathologic dilatation of small bowel or colon. The appendix is not confidently identified and may be surgically absent. Regardless, there are no inflammatory changes noted adjacent to the cecum to suggest the presence of an acute appendicitis at this time. Vascular/Lymphatic: Aortic atherosclerosis, with vascular findings and measurements pertinent to potential TAVR procedure, as detailed below. No aneurysm or dissection noted in the abdominal or pelvic vasculature. No lymphadenopathy noted in the abdomen or pelvis. Reproductive: Prostate gland and seminal vesicles are unremarkable in appearance. Other: No significant volume of ascites.  No pneumoperitoneum. Musculoskeletal: There are no aggressive appearing lytic or blastic lesions noted in the visualized  portions of the skeleton. VASCULAR MEASUREMENTS PERTINENT TO TAVR: AORTA: Minimal Aortic Diameter-16 x 16 mm Severity of Aortic Calcification-mild-to-moderate RIGHT PELVIS: Right Common Iliac Artery - Minimal Diameter-11.1 x 10.6 mm Tortuosity-mild Calcification-mild Right External Iliac Artery - Minimal Diameter-8.8 x 9.3 mm Tortuosity-severe Calcification-none Right Common Femoral Artery - Minimal Diameter-9.0 x 8.8 mm Tortuosity-mild Calcification-none LEFT PELVIS: Left Common Iliac Artery - Minimal Diameter-8.2 x 10.4 mm Tortuosity-severe Calcification-mild Left External Iliac Artery - Minimal Diameter-9.1 x 9.0 mm Tortuosity-severe Calcification-none Left Common Femoral Artery - Minimal Diameter-8.5 x 9.0 mm Tortuosity-mild Calcification-none Review of the MIP images confirms the above findings. IMPRESSION: 1. Vascular findings and measurements pertinent to potential TAVR procedure, as detailed above. 2. Severe thickening calcification of the aortic valve, compatible with the reported clinical history of severe aortic stenosis. 3. Aortic atherosclerosis, in addition to left main and 3 vessel coronary artery disease. Status post median sternotomy for CABG including LIMA to the LAD. 4. Additional incidental findings, as above. Electronically Signed   By: Trudie Reed M.D.   On: 03/27/2019 14:37   Ct Angio Abd/pel W/ And/or W/o  Result Date: 03/27/2019 CLINICAL DATA:  78 year old male with history of severe aortic valve stenosis. Preprocedural study prior to potential transcatheter aortic valve replacement (TAVR) procedure. EXAM: CT ANGIOGRAPHY CHEST, ABDOMEN AND PELVIS TECHNIQUE: Multidetector CT imaging through the chest, abdomen and pelvis was performed using the standard protocol during bolus administration of intravenous contrast. Multiplanar reconstructed images and MIPs were obtained and reviewed to evaluate the vascular anatomy. CONTRAST:  OMNIPAQUE IOHEXOL 350 MG/ML SOLN COMPARISON:  Chest  CTA 04/03/2014. FINDINGS: CTA CHEST FINDINGS Cardiovascular: Heart size is borderline enlarged. There is no significant pericardial fluid, thickening or pericardial calcification. There is aortic atherosclerosis, as well as atherosclerosis of the great vessels of the mediastinum and the coronary arteries, including calcified atherosclerotic plaque in the left main, left anterior descending, left circumflex and right coronary arteries. Status post median sternotomy for CABG including LIMA to the LAD. Severe thickening calcification of the aortic valve. Left-sided pacemaker device in place with lead tips terminating in the right atrium and right ventricular apex. Mediastinum/Lymph Nodes: No pathologically enlarged mediastinal or hilar lymph nodes. Please note that accurate exclusion of hilar adenopathy is limited on noncontrast CT scans. Esophagus is unremarkable in appearance. No axillary lymphadenopathy. Lungs/Pleura: No suspicious appearing pulmonary nodules or masses are noted. No acute consolidative airspace disease. No pleural effusions. Musculoskeletal/Soft Tissues: Median sternotomy wires. There are no aggressive appearing lytic or blastic lesions noted in the visualized portions of the skeleton. CTA ABDOMEN AND PELVIS FINDINGS  Hepatobiliary: No suspicious cystic or solid hepatic lesions. No intra or extrahepatic biliary ductal dilatation. Status post cholecystectomy. Pancreas: No pancreatic mass. No pancreatic ductal dilatation. No pancreatic or peripancreatic fluid collections or inflammatory changes. Spleen: Unremarkable. Adrenals/Urinary Tract: Multiple low-attenuation lesions are noted in both kidneys, compatible with simple cysts, largest of which is in the medial aspect of the upper pole the right kidney measuring 3 cm. Mild multifocal cortical thinning in both kidneys. No hydroureteronephrosis. Urinary bladder is normal in appearance. Bilateral adrenal glands are normal in appearance. Stomach/Bowel:  Normal appearance of the stomach. No pathologic dilatation of small bowel or colon. The appendix is not confidently identified and may be surgically absent. Regardless, there are no inflammatory changes noted adjacent to the cecum to suggest the presence of an acute appendicitis at this time. Vascular/Lymphatic: Aortic atherosclerosis, with vascular findings and measurements pertinent to potential TAVR procedure, as detailed below. No aneurysm or dissection noted in the abdominal or pelvic vasculature. No lymphadenopathy noted in the abdomen or pelvis. Reproductive: Prostate gland and seminal vesicles are unremarkable in appearance. Other: No significant volume of ascites.  No pneumoperitoneum. Musculoskeletal: There are no aggressive appearing lytic or blastic lesions noted in the visualized portions of the skeleton. VASCULAR MEASUREMENTS PERTINENT TO TAVR: AORTA: Minimal Aortic Diameter-16 x 16 mm Severity of Aortic Calcification-mild-to-moderate RIGHT PELVIS: Right Common Iliac Artery - Minimal Diameter-11.1 x 10.6 mm Tortuosity-mild Calcification-mild Right External Iliac Artery - Minimal Diameter-8.8 x 9.3 mm Tortuosity-severe Calcification-none Right Common Femoral Artery - Minimal Diameter-9.0 x 8.8 mm Tortuosity-mild Calcification-none LEFT PELVIS: Left Common Iliac Artery - Minimal Diameter-8.2 x 10.4 mm Tortuosity-severe Calcification-mild Left External Iliac Artery - Minimal Diameter-9.1 x 9.0 mm Tortuosity-severe Calcification-none Left Common Femoral Artery - Minimal Diameter-8.5 x 9.0 mm Tortuosity-mild Calcification-none Review of the MIP images confirms the above findings. IMPRESSION: 1. Vascular findings and measurements pertinent to potential TAVR procedure, as detailed above. 2. Severe thickening calcification of the aortic valve, compatible with the reported clinical history of severe aortic stenosis. 3. Aortic atherosclerosis, in addition to left main and 3 vessel coronary artery disease. Status  post median sternotomy for CABG including LIMA to the LAD. 4. Additional incidental findings, as above. Electronically Signed   By: Trudie Reed M.D.   On: 03/27/2019 14:37   Disposition   Pt is being discharged home today in good condition.  Follow-up Plans & Appointments    Follow-up Information    Kathleene Hazel, MD. Go on 04/18/2019.   Specialty: Cardiology Why: @ 9:30am followed by a coumadin clinic appointment at 10:30am.  Contact information: [address] [place] [address] [phone]          Discharge Instructions    Amb Referral to Cardiac Rehabilitation   Complete by: As directed    Diagnosis: Valve Replacement   Valve: Aortic Comment - TAVR   After initial evaluation and assessments completed: Virtual Based Care may be provided alone or in conjunction with Phase 2 Cardiac Rehab based on patient barriers.: Yes      Discharge Medications   Allergies as of 04/09/2019      Reactions   Acetaminophen-codeine Other (See Comments)   Extreme constipation Extreme constipation   Amoxicillin Itching, Rash   Did it involve swelling of the face/tongue/throat, SOB, or low BP? No Did it involve sudden or severe rash/hives, skin peeling, or any reaction on the inside of your mouth or nose? Unknown  Did you need to seek medical attention at a hospital  or doctor's office? No When did it last happen?More than 40 years ago  If all above answers are "NO", may proceed with cephalosporin use.   Diovan [valsartan] Other (See Comments)   Unknown       Medication List    TAKE these medications   acetaminophen 500 MG tablet Commonly known as: TYLENOL Take 1,000 mg by mouth 3 (three) times daily.   Antivert 25 MG tablet Generic drug: meclizine Take 25 mg by mouth daily as needed for dizziness.   aspirin 81 MG EC tablet Take 1 tablet (81 mg total) by mouth daily for 12 days. Notes to patient: STOP ASPIRIN ON 04/21/19   atorvastatin  80 MG tablet Commonly known as: LIPITOR TAKE 1 TABLET BY MOUTH  DAILY   carvedilol 25 MG tablet Commonly known as: COREG TAKE 1 TABLET(25 MG) BY MOUTH TWICE DAILY WITH A MEAL What changed: See the new instructions.   clopidogrel 75 MG tablet Commonly known as: PLAVIX Take 1 tablet (75 mg total) by mouth daily with breakfast.   docusate sodium 100 MG capsule Commonly known as: COLACE Take 200 mg by mouth daily.   fexofenadine 180 MG tablet Commonly known as: ALLEGRA Take 180 mg by mouth daily.   Flonase 50 MCG/ACT nasal spray Generic drug: fluticasone Place 2 sprays into the nose daily.   furosemide 80 MG tablet Commonly known as: LASIX TAKE 1 TABLET BY MOUTH TWICE DAILY. TAKE WITH 20 MG TABLET What changed:   how much to take  how to take this  when to take this  additional instructions   furosemide 20 MG tablet Commonly known as: LASIX TAKE 1 TABLET BY MOUTH TWICE DAILY. WITH 80 MG TO TOTAL 100MG  What changed:   how much to take  how to take this  when to take this  additional instructions   Glucosamine Chondr 1500 Complx Caps Take 1 capsule by mouth every morning.   hydrALAZINE 25 MG tablet Commonly known as: APRESOLINE TAKE 1 TABLET(25 MG) BY MOUTH THREE TIMES DAILY What changed: See the new instructions.   isosorbide mononitrate 30 MG 24 hr tablet Commonly known as: IMDUR TAKE 1 TABLET BY MOUTH EVERY DAY   L-Lysine 1000 MG Tabs Take 1,000 mg by mouth daily.   multivitamin tablet Take 1 tablet by mouth daily. Centrum silver   nitroGLYCERIN 0.4 MG SL tablet Commonly known as: NITROSTAT Place 1 tablet (0.4 mg total) under the tongue every 5 (five) minutes as needed for chest pain.   ONE TOUCH ULTRA TEST test strip Generic drug: glucose blood 1 each by Other route every morning.   OneTouch Delica Lancets Fine Misc 1 each by Other route every morning.   pantoprazole 40 MG tablet Commonly known as: PROTONIX Take 40 mg by mouth daily.    potassium chloride SA 20 MEQ tablet Commonly known as: KLOR-CON TAKE 2 TABLETS BY MOUTH TWICE DAILY   RA Natural Magnesium 250 MG Tabs Generic drug: Magnesium TAKE 2 TABLETS TWICE DAILY. What changed: how much to take   spironolactone 25 MG tablet Commonly known as: ALDACTONE TAKE 1 TABLET(25 MG) BY MOUTH DAILY What changed: See the new instructions.   Vascepa 1 g Caps Generic drug: Icosapent Ethyl Take 2 capsules (2 g total) by mouth 2 (two) times daily.   warfarin 5 MG tablet Commonly known as: COUMADIN Take as directed. If you are unsure how to take this medication, talk to your nurse or doctor. Original instructions: Take 1 to 1.5 tablets daily  as directed by Coumadin clinic What changed:   how much to take  how to take this  when to take this  additional instructions           Outstanding Labs/Studies   INR 11/20  Duration of Discharge Encounter   Greater than 30 minutes including physician time.  Signed, Cline CrockKathryn Thompson, PA-C 04/09/2019, 10:56 AM [phone]  I have personally seen and examined this patient. I agree with the assessment and plan as outlined above.  He is doing well day one post TAVR. Groins stable. Echo with no PVL. Normal LV systolic function. Discharge on coumadin, ASA and Plavix. Will stop ASA one month post PCI.   Verne CarrowChristopher Gertha Lichtenberg 04/09/2019 12:01 PM

## 2019-04-10 ENCOUNTER — Telehealth: Payer: Self-pay | Admitting: Physician Assistant

## 2019-04-10 ENCOUNTER — Telehealth (HOSPITAL_COMMUNITY): Payer: Self-pay

## 2019-04-10 ENCOUNTER — Ambulatory Visit: Payer: Medicare Other | Admitting: Cardiovascular Disease

## 2019-04-10 NOTE — Telephone Encounter (Signed)
  [name] VALVE TEAM   Patient contacted regarding discharge from [facility] on 04/09/19  Patient understands to follow up with provider Lauree Chandler MD on 11/20 at [facility].  Patient understands discharge instructions? yes Patient understands medications and regimen? yes Patient understands to bring all medications to this visit? yes  Angelena Form PA-C  [facility]

## 2019-04-10 NOTE — Telephone Encounter (Signed)
Pt insurance is active and benefits verified through [facility]. Co-pay $20.00, DED $0.00/$0.00 met, out of pocket $3,900.00/$1,630.05 met, co-insurance 0%. No pre-authorization required. Passport, 04/10/2019 @ 9:37AM, MQK#[id number]  Will pass to RN Navigator for review.

## 2019-04-18 ENCOUNTER — Encounter: Payer: Self-pay | Admitting: Cardiovascular Disease

## 2019-04-18 ENCOUNTER — Ambulatory Visit (INDEPENDENT_AMBULATORY_CARE_PROVIDER_SITE_OTHER): Payer: Medicare Other | Admitting: Pharmacist

## 2019-04-18 ENCOUNTER — Ambulatory Visit: Payer: Medicare Other | Admitting: Cardiovascular Disease

## 2019-04-18 ENCOUNTER — Other Ambulatory Visit: Payer: Self-pay

## 2019-04-18 VITALS — BP 110/70 | HR 88 | Ht 67.0 in | Wt 243.8 lb

## 2019-04-18 DIAGNOSIS — Z952 Presence of prosthetic heart valve: Secondary | ICD-10-CM

## 2019-04-18 DIAGNOSIS — Z5181 Encounter for therapeutic drug level monitoring: Secondary | ICD-10-CM

## 2019-04-18 DIAGNOSIS — I350 Nonrheumatic aortic (valve) stenosis: Secondary | ICD-10-CM | POA: Diagnosis not present

## 2019-04-18 DIAGNOSIS — I480 Paroxysmal atrial fibrillation: Secondary | ICD-10-CM | POA: Diagnosis not present

## 2019-04-18 LAB — POCT INR: INR: 1.9 — AB (ref 2.0–3.0)

## 2019-04-18 MED ORDER — CLINDAMYCIN HCL 300 MG PO CAPS: ORAL_CAPSULE | ORAL | 0 refills | Status: DC

## 2019-04-18 NOTE — Progress Notes (Signed)
Structural Heart Clinic Note  Chief Complaint  Patient presents with  . Follow-up    Post TAVR follow up   History of Present Illness: 78 yo male with history of carotid artery disease, CAD s/p 4V CABG, chronic systolic CHF, non-ischemic cardiomyopathy, HTN, HLD, borderline DM on no therapy, paroxysmal atrial fibrillation, stage 2 CKD, sleep apnea, bradycardia s/p pacemaker and severe aortic stenosis now sp TAVR who is here today for one week TAVR follow up. I saw him as a new consult to discuss TAVR on 02/07/19. He was referred by Dr. Radford Pax to discuss his aortic stenosis and possible TAVR. He has CAD and underwent 4V CABG in 2015. He has had a pacemaker implanted remotely for bradycardia and syncope. He has paroxysmal atrial fibrillation and is on coumadin. His aortic stenosis has been followed for several years. Echo 01/31/19 showed LVEF=45-50% with severe AS. TAVR workup delayed while he completed his planned cataract surgery. Cardiac cath 03/21/19 with 3/4 patent bypass grafts. The SVG to the PDA is known to be occluded. A drug eluting stent was placed in the distal RCA.   He underwent TAVR on 04/08/19 with placement of a 29 mm Edwards Sapien 3 THV was implanted from the right femoral artery approach. He did well following the procedure. His echo on post operative day one showed normally functioning bioprosthetic AVR with no AI (mean gradient 12 mmHg).   He tells me today that he is doing well. The patient denies any chest pain, dyspnea, palpitations, lower extremity edema, orthopnea, PND, dizziness, near syncope or syncope.   Primary Care Physician: Leighton Ruff, MD Primary Cardiologist: Fransico Him Referring Cardiologist: Fransico Him  Past Medical History:  Diagnosis Date  . Chronic renal insufficiency   . Coronary artery disease    a. s/p PCI of LAD 1997. b. Cutting balloon angioplasty to LCx in 2002. c. s/p cath 11/2013 with severe 3 vessel ASCAD s/p CABG with LIMA to LAD, SVG  to diag, SVG to left circ and SVG to PDA. d. cath 07/07/2014 occluded SVG to PDA, all other grafts patent. 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 DJD (degenerative joint disease)   . Epistaxis   . GERD (gastroesophageal reflux disease)   . GI bleeding    a.  with benign gastric polypectomy 05/2014.  Marland Kitchen HLD (hyperlipidemia)   . HTN (hypertension)   . Obesity   . OSA (obstructive sleep apnea)   . Pacemaker    a. 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 b. MDT generator replacement 2012.  Marland Kitchen PAF (paroxysmal atrial fibrillation) (HCC)    on chronic systemic anticoagulation  . PVC's (premature ventricular contractions)   . 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 Severe aortic stenosis   . Syncope    a. vasovagal with documented bradycardia.  . Type 2 diabetes mellitus without complications ([place]) 10/28/6071    Past Surgical History:  Procedure Laterality Date  . ANGIOPLASTY  1997  . CHOLECYSTECTOMY    . COLONOSCOPY WITH PROPOFOL N/A 05/05/2014   Procedure: COLONOSCOPY WITH PROPOFOL;  Surgeon: Garlan Fair, MD;  Location: [facility] ENDOSCOPY;  Service: Endoscopy;  Laterality: N/A;  . CORONARY ARTERY BYPASS GRAFT N/A 12/01/2013   Procedure: CORONARY ARTERY BYPASS GRAFT TIMES FOUR USING LEFT INTERNAL  MAMMARY ARTERY TO LAD, SAPHENOUS VEIN GRAFTS TO DIAGONAL, CIRCUMFELX AND POSTERIOR DESCENDING;  Surgeon: Kerin Perna, MD;  Location: [facility] OR;  Service: Open Heart Surgery;  Laterality: N/A;  . CORONARY STENT INTERVENTION N/A 03/21/2019   Procedure: CORONARY STENT INTERVENTION;  Surgeon: Kathleene Hazel, MD;  Location: [facility] INVASIVE CV LAB;  Service: Cardiovascular;  Laterality: N/A;  Distal RCA  . ESOPHAGOGASTRODUODENOSCOPY (EGD) WITH PROPOFOL N/A 05/05/2014   Procedure: ESOPHAGOGASTRODUODENOSCOPY (EGD) WITH PROPOFOL;  Surgeon:  Charolett Bumpers, MD;  Location: [facility] ENDOSCOPY;  Service: Endoscopy;  Laterality: N/A;  . KNEE ARTHROSCOPY  2000   Left  . LEFT AND RIGHT HEART CATHETERIZATION WITH CORONARY/GRAFT ANGIOGRAM N/A 07/07/2014   Procedure: LEFT AND RIGHT HEART CATHETERIZATION WITH Isabel Caprice;  Surgeon: Kathleene Hazel, MD;  Location: [facility] CATH LAB;  Service: Cardiovascular;  Laterality: N/A;  . LEFT HEART CATHETERIZATION WITH CORONARY ANGIOGRAM N/A 11/25/2013   Procedure: LEFT HEART CATHETERIZATION WITH CORONARY ANGIOGRAM;  Surgeon: [name], MD;  Location: [facility] CATH LAB;  Service: Cardiovascular;  Laterality: N/A;  . PACEMAKER INSERTION  2002   generator change MDT by Dr Graciela Husbands in 2012  . RIGHT/LEFT HEART CATH AND CORONARY/GRAFT ANGIOGRAPHY N/A 03/21/2019   Procedure: RIGHT/LEFT HEART CATH AND CORONARY/GRAFT ANGIOGRAPHY;  Surgeon: Kathleene Hazel, MD;  Location: [facility] INVASIVE CV LAB;  Service: Cardiovascular;  Laterality: N/A;  . TEE WITHOUT CARDIOVERSION N/A 08/31/2014   Procedure: TRANSESOPHAGEAL ECHOCARDIOGRAM (TEE);  Surgeon: [name], MD;  Location: [facility] ENDOSCOPY;  Service: Cardiovascular;  Laterality: N/A;  . TEE WITHOUT CARDIOVERSION N/A 04/08/2019   Procedure: TRANSESOPHAGEAL ECHOCARDIOGRAM (TEE);  Surgeon: Kathleene Hazel, MD;  Location: [facility] OR;  Service: Open Heart Surgery;  Laterality: N/A;  . TRANSCATHETER AORTIC VALVE REPLACEMENT, TRANSFEMORAL  04/08/2019  . TRANSCATHETER AORTIC VALVE REPLACEMENT, TRANSFEMORAL N/A 04/08/2019   Procedure: TRANSCATHETER AORTIC VALVE REPLACEMENT, TRANSFEMORAL;  Surgeon: Kathleene Hazel, MD;  Location: [facility] OR;  Service: Open Heart Surgery;  Laterality: N/A;    Current Outpatient Medications  Medication Sig Dispense Refill  . acetaminophen (TYLENOL) 500 MG tablet Take 1,000 mg by mouth 3 (three) times daily.     Marland Kitchen aspirin 81 MG EC tablet Take 1 tablet (81 mg total) by mouth daily for 12 days.    Marland Kitchen atorvastatin (LIPITOR) 80 MG  tablet TAKE 1 TABLET BY MOUTH  DAILY 90 tablet 2  . carvedilol (COREG) 25 MG tablet TAKE 1 TABLET(25 MG) BY MOUTH TWICE DAILY WITH A MEAL 180 tablet 2  . clopidogrel (PLAVIX) 75 MG tablet Take 1 tablet (75 mg total) by mouth daily with breakfast. 90 tablet 3  . docusate sodium (COLACE) 100 MG capsule Take 200 mg by mouth daily.     . fexofenadine (ALLEGRA) 180 MG tablet Take 180 mg by mouth daily.     . fluticasone (FLONASE) 50 MCG/ACT nasal spray Place 2 sprays into the nose daily.     . furosemide (LASIX) 20 MG tablet TAKE 1 TABLET BY MOUTH TWICE DAILY. WITH 80 MG TO TOTAL 100MG  180 tablet 2  . furosemide (LASIX) 80 MG tablet TAKE 1 TABLET BY MOUTH TWICE DAILY. TAKE WITH 20 MG TABLET 180 tablet 2  . Glucosamine-Chondroit-Vit C-Mn (GLUCOSAMINE CHONDR 1500 COMPLX) CAPS Take 1 capsule by mouth every morning.     . hydrALAZINE (APRESOLINE) 25 MG tablet TAKE 1 TABLET(25 MG) BY MOUTH THREE TIMES DAILY 270 tablet 2  . Icosapent Ethyl (VASCEPA) 1 g CAPS Take 2 capsules (2 g total) by mouth  2 (two) times daily. 360 capsule 4  . isosorbide mononitrate (IMDUR) 30 MG 24 hr tablet TAKE 1 TABLET BY MOUTH EVERY DAY 90 tablet 2  . L-Lysine 1000 MG TABS Take 1,000 mg by mouth daily.     . meclizine (ANTIVERT) 25 MG tablet Take 25 mg by mouth daily as needed for dizziness.     . Multiple Vitamin (MULTIVITAMIN) tablet Take 1 tablet by mouth daily. Centrum silver    . nitroGLYCERIN (NITROSTAT) 0.4 MG SL tablet Place 1 tablet (0.4 mg total) under the tongue every 5 (five) minutes as needed for chest pain. 30 tablet 3  . ONE TOUCH ULTRA TEST test strip 1 each by Other route every morning.     Letta Pate DELICA LANCETS FINE MISC 1 each by Other route every morning.     . pantoprazole (PROTONIX) 40 MG tablet Take 40 mg by mouth daily.     . potassium chloride SA (K-DUR) 20 MEQ tablet TAKE 2 TABLETS BY MOUTH TWICE DAILY 360 tablet 2  . RA NATURAL MAGNESIUM 250 MG TABS TAKE 2 TABLETS TWICE DAILY. 120 tablet 5  .  spironolactone (ALDACTONE) 25 MG tablet TAKE 1 TABLET(25 MG) BY MOUTH DAILY 90 tablet 2  . warfarin (COUMADIN) 5 MG tablet Take 1 to 1.5 tablets daily as directed by Coumadin clinic (Patient taking differently: Take 5-7.5 mg by mouth at bedtime. Take 5 mg on Tuesday and Thursday Take 7.5 mg all the other days) 135 tablet 1  . clindamycin (CLEOCIN) 300 MG capsule Take 2 pills (600 mg) 2 hours before your dental procedure 12 capsule 0   No current facility-administered medications for this visit.     Allergies  Allergen Reactions  . Acetaminophen-Codeine Other (See Comments)    Extreme constipation Extreme constipation   . Amoxicillin Itching and Rash    Did it involve swelling of the face/tongue/throat, SOB, or low BP? No Did it involve sudden or severe rash/hives, skin peeling, or any reaction on the inside of your mouth or nose? Unknown  Did you need to seek medical attention at a hospital or doctor's office? No When did it last happen?More than 40 years ago  If all above answers are "NO", may proceed with cephalosporin use.   . Diovan [Valsartan] Other (See Comments)    Unknown     Social History   Socioeconomic History  . Marital status: Married    Spouse name: Not on file  . Number of children: Not on file  . Years of education: Not on file  . Highest education level: Not on file  Occupational History  . Not on file  Social Needs  . Financial resource strain: Not on file  . Food insecurity    Worry: Not on file    Inability: Not on file  . Transportation needs    Medical: Not on file    Non-medical: Not on file  Tobacco Use  . Smoking status: Former Smoker    Packs/day: 2.00    Years: 5.00    Pack years: 10.00    Types: Cigarettes    Quit date: 05/29/1966    Years since quitting: 52.9  . Smokeless tobacco: Never Used  Substance and Sexual Activity  . Alcohol use: Yes    Alcohol/week: 0.0 standard drinks    Comment: ocassional  . Drug use: No  . Sexual  activity: Not on file  Lifestyle  . Physical activity    Days per week: Not on file  Minutes per session: Not on file  . Stress: Not on file  Relationships  . Social Musicianconnections    Talks on phone: Not on file    Gets together: Not on file    Attends religious service: Not on file    Active member of club or organization: Not on file    Attends meetings of clubs or organizations: Not on file    Relationship status: Not on file  . Intimate partner violence    Fear of current or ex partner: Not on file    Emotionally abused: Not on file    Physically abused: Not on file    Forced sexual activity: Not on file  Other Topics Concern  . Not on file  Social History Narrative   [place].  Retired Medical illustratorsalesman    Family History  Problem Relation Age of Onset  . Colon cancer Mother        deceased  . Heart attack Mother   . Leukemia Father        deceased  . Heart attack Brother        deceased    Review of Systems:  As stated in the HPI and otherwise negative.   BP 110/70   Pulse 88   Ht 5\' 7"  (1.702 m)   Wt 243 lb 12.8 oz (110.6 kg)   SpO2 97%   BMI 38.18 kg/m   Physical Examination:  General: Well developed, well nourished, NAD  HEENT: OP clear, mucus membranes moist  SKIN: warm, dry. No rashes. Neuro: No focal deficits  Musculoskeletal: Muscle strength 5/5 all ext  Psychiatric: Mood and affect normal  Neck: No JVD, no carotid bruits, no thyromegaly, no lymphadenopathy.  Lungs:Clear bilaterally, no wheezes, rhonci, crackles Cardiovascular: Regular rate and rhythm. No murmurs, gallops or rubs. Abdomen:Soft. Bowel sounds present. Non-tender.  Extremities: No lower extremity edema. Mild bruising over both groins. Soft. No hematoma. Pulses are 2 + in the bilateral DP/PT.  EKG:  EKG is not ordered today. The ekg ordered today demonstrates   Echo 04/09/19:  1. Left ventricular ejection fraction, by visual estimation, is 50 to 55%. The left  ventricle has normal function. Left ventricular septal wall thickness was mildly increased. Mildly increased left ventricular posterior wall thickness. There is mildly  increased left ventricular hypertrophy.  2. Left ventricular diastolic parameters are consistent with Grade I diastolic dysfunction (impaired relaxation).  3. Global right ventricle has normal systolic function.The right ventricular size is normal. No increase in right ventricular wall thickness.  4. Left atrial size was mildly dilated.  5. Right atrial size was mildly dilated.  6. The mitral valve is normal in structure. Trace mitral valve regurgitation. No evidence of mitral stenosis.  7. The tricuspid valve is normal in structure. Tricuspid valve regurgitation is trivial.  8. Aortic valve regurgitation is not visualized. Mild aortic valve stenosis.  9. The pulmonic valve was normal in structure. Pulmonic valve regurgitation is not visualized. 10. Normal pulmonary artery systolic pressure. 11. The inferior vena cava is normal in size with greater than 50% respiratory variability, suggesting right atrial pressure of 3 mmHg.  Recent Labs: 04/04/2019: ALT 24; B Natriuretic Peptide 70.7 04/09/2019: BUN 22; Creatinine, Ser 1.26; Hemoglobin 14.4; Magnesium 2.1; Platelets 114; Potassium 4.1; Sodium 136    Wt Readings from Last 3 Encounters:  04/18/19 243 lb 12.8 oz (110.6 kg)  04/09/19 240 lb 4.8 oz (109 kg)  04/04/19 241 lb 7 oz (109.5 kg)  Other studies Reviewed: Additional studies/ records that were reviewed today include: Echo images Review of the above records demonstrates: severe AS   Assessment and Plan:   1. Severe Aortic Valve Stenosis: He is now one week out from placement of an Edwards Sapien 3 THV from the femoral approach. He is doing well. No groin issues, dyspnea or dizziness. Will continue ASA and Plavix until Monday (will stop ASA then, one month post coronary stent). He will continue Plavix for a total of  six months post TAVR. Continue coumadin. Will arrange echo one month post TAVR per protocol. Will give him a prescription for Clindamycin 600 mg po to use before his dental appt in January. (amoxicillin allergy).   The following changes have been made:  no change  Labs/ tests ordered today include:  No orders of the defined types were placed in this encounter.  Disposition:   FU with the Carlean JewsKatie Thompson, PA-C in 3 weeks (One month post TAVR follow up).   Signed, Verne Carrowhristopher Eydie Wormley, MD 04/18/2019 10:05 AM    [facility] [address], [place], [place]  [address] Phone: [phone]) [phone]; Fax: [phone]) [phone]

## 2019-04-18 NOTE — Patient Instructions (Signed)
Take 2 tablets tonight then continue taking same dosage 1.5 tablets every day except 1 tablet on Tuesdays and Thursdays.  Recheck INR 4 weeks.  Coumadin clinic [phone].

## 2019-04-18 NOTE — Patient Instructions (Signed)
Medication Instructions:  Your physician has recommended you make the following change in your medication:  TAKE Clindamycin 600 mg 1-2 hours before your dental procedure  *If you need a refill on your cardiac medications before your next appointment, please call your pharmacy*  Lab Work: None Ordered   Testing/Procedures: None Ordered   Follow-Up: At [facility], you and your health needs are our priority.  As part of our continuing mission to provide you with exceptional heart care, we have created designated Provider Care Teams.  These Care Teams include your primary Cardiologist (physician) and Advanced Practice Providers (APPs -  Physician Assistants and Nurse Practitioners) who all work together to provide you with the care you need, when you need it.  Your next appointment:   4 week(s) on December 17  The format for your next appointment:   In Person  Provider:   Angelena Form, PA

## 2019-04-21 ENCOUNTER — Telehealth (HOSPITAL_COMMUNITY): Payer: Self-pay | Admitting: *Deleted

## 2019-04-21 NOTE — Telephone Encounter (Signed)
-----   Message from Burnell Blanks, MD sent at 04/21/2019  9:48 AM EST ----- Regarding: RE: Ok to proceed with group exercise with High Covid Risk Score Ok to proceed. Thanks.  ----- Message ----- From: Rowe Pavy, RN Sent: 04/18/2019   2:10 PM EST To: Burnell Blanks, MD Subject: Ok to proceed with group exercise with High #  Dr. Angelena Form,    We are  seeing patients on-site for cardiac rehab . A great deal of planning with advisement from our Medical Director - Dr. Radford Pax, CV Service line leadership, Infection Disease Control, Facilities, security, recommendations from American Association of Cardiac and Pulmonary Rehab (AACVPR) with the goal for optimal patient safety. Patients will have strict guidelines and criteria they must adhere to and follow. Patients will wear a mask during exercise and practice social distancing. Patients will have to complete screening prior to entry into gym area.   Your patient expressed great interest in participating in facility cardiac rehab. Patient has a COVID-19 risk score of 8. Do you feel this patient is appropriate to resume exercise in facility cardiac rehab? Any additional restrictions you feel are appropriate for this patient?    Seen in follow up on 11/20   Thank you and we appreciate your input Looking forward to working with Clair Gulling again!!  [place], BSN Cardiac and Pulmonary Rehab Nurse Navigator  Cardiac Rehab Staff

## 2019-04-23 ENCOUNTER — Telehealth (HOSPITAL_COMMUNITY): Payer: Self-pay

## 2019-04-23 NOTE — Telephone Encounter (Signed)
Called patient to see if he was interested in participating in the Cardiac Rehab Program. Patient stated yes. Patient will come in for orientation on 05/27/2019 @ 130PM and will attend the 115PM exercise class.  Tourist information centre manager.

## 2019-05-01 ENCOUNTER — Telehealth: Payer: Self-pay | Admitting: Pharmacist

## 2019-05-01 NOTE — Telephone Encounter (Signed)
Patient had called clinic earlier today. Returning call. No answer. Left VM for patient to return call if he still needed anything.

## 2019-05-01 NOTE — Telephone Encounter (Signed)
Patient called to report issues with vision and burning in legs/ aching at back since restarting vascepa. Patient states he has an eye doctors appointment tomorrow and he isnt going to take the medication until he sees them. I suggested he could try taking a lower dose, 1g BID or if it caused him too much discomfort he could stop it. Muscle pain is a reported side effect but issues with vision is not. Patient states he will see eye doctor and then let us know where he is at at that time.

## 2019-05-06 ENCOUNTER — Ambulatory Visit (INDEPENDENT_AMBULATORY_CARE_PROVIDER_SITE_OTHER): Payer: Medicare Other | Admitting: *Deleted

## 2019-05-06 DIAGNOSIS — R001 Bradycardia, unspecified: Secondary | ICD-10-CM | POA: Diagnosis not present

## 2019-05-06 LAB — CUP PACEART REMOTE DEVICE CHECK
Battery Impedance: 907 Ohm
Battery Remaining Longevity: 69 mo
Battery Voltage: 2.77 V
Brady Statistic AP VP Percent: 0 %
Brady Statistic AP VS Percent: 69 %
Brady Statistic AS VP Percent: 0 %
Brady Statistic AS VS Percent: 30 %
Date Time Interrogation Session: 20201208135518
Implantable Lead Implant Date: 20020926
Implantable Lead Implant Date: 20020926
Implantable Lead Location: 753859
Implantable Lead Location: 753860
Implantable Lead Model: 5076
Implantable Lead Model: 5092
Implantable Pulse Generator Implant Date: 20120905
Lead Channel Impedance Value: 504 Ohm
Lead Channel Impedance Value: 951 Ohm
Lead Channel Pacing Threshold Amplitude: 0.625 V
Lead Channel Pacing Threshold Amplitude: 1 V
Lead Channel Pacing Threshold Pulse Width: 0.4 ms
Lead Channel Pacing Threshold Pulse Width: 0.4 ms
Lead Channel Setting Pacing Amplitude: 2 V
Lead Channel Setting Pacing Amplitude: 2.5 V
Lead Channel Setting Pacing Pulse Width: 0.4 ms
Lead Channel Setting Sensing Sensitivity: 4 mV

## 2019-05-13 ENCOUNTER — Telehealth (HOSPITAL_COMMUNITY): Payer: Self-pay

## 2019-05-13 NOTE — Telephone Encounter (Signed)
Cardiac Rehab Medication Review by a Pharmacist  Does the patient  feel that his/her medications are working for him/her?  yes Specifically, pt mentions his GERD symptoms are almost completely resolved with Protonix.   Has the patient been experiencing any side effects to the medications prescribed?  yes Pt had "terrible" side effect to Vascepa and had to stop taking it.  Does the patient measure his/her own blood pressure or blood glucose at home?  yes Pt checks blood glucose daily in the morning, usually between 130-150 mg/dL. Pt has pre-diabetes and does not take any medications for it. Pt does not check BP at home.  Does the patient have any problems obtaining medications due to transportation or finances?   no Pt did mention Vascepa was very expensive, but he can't take it any more due to side effects.  Understanding of regimen: excellent Understanding of indications: excellent Potential of compliance: excellent    Pharmacist comments: None    Berenice Bouton, PharmD PGY1 Pharmacy Resident Office phone: [phone] 05/13/2019 11:48 AM

## 2019-05-15 ENCOUNTER — Encounter (HOSPITAL_COMMUNITY): Payer: Self-pay | Admitting: Cardiovascular Disease

## 2019-05-15 ENCOUNTER — Ambulatory Visit (INDEPENDENT_AMBULATORY_CARE_PROVIDER_SITE_OTHER): Payer: Medicare Other | Admitting: *Deleted

## 2019-05-15 ENCOUNTER — Ambulatory Visit: Payer: Medicare Other | Admitting: Physician Assistant

## 2019-05-15 ENCOUNTER — Encounter: Payer: Self-pay | Admitting: Physician Assistant

## 2019-05-15 ENCOUNTER — Ambulatory Visit (HOSPITAL_COMMUNITY): Payer: Medicare Other | Attending: Cardiology

## 2019-05-15 ENCOUNTER — Telehealth: Payer: Self-pay | Admitting: Pharmacist

## 2019-05-15 ENCOUNTER — Other Ambulatory Visit: Payer: Self-pay

## 2019-05-15 VITALS — BP 128/68 | HR 66 | Ht 67.0 in | Wt 242.0 lb

## 2019-05-15 DIAGNOSIS — I480 Paroxysmal atrial fibrillation: Secondary | ICD-10-CM

## 2019-05-15 DIAGNOSIS — Z5181 Encounter for therapeutic drug level monitoring: Secondary | ICD-10-CM | POA: Diagnosis not present

## 2019-05-15 DIAGNOSIS — I2510 Atherosclerotic heart disease of native coronary artery without angina pectoris: Secondary | ICD-10-CM

## 2019-05-15 DIAGNOSIS — Z952 Presence of prosthetic heart valve: Secondary | ICD-10-CM | POA: Insufficient documentation

## 2019-05-15 DIAGNOSIS — I5042 Chronic combined systolic (congestive) and diastolic (congestive) heart failure: Secondary | ICD-10-CM | POA: Diagnosis not present

## 2019-05-15 DIAGNOSIS — I10 Essential (primary) hypertension: Secondary | ICD-10-CM

## 2019-05-15 LAB — POCT INR: INR: 3.2 — AB (ref 2.0–3.0)

## 2019-05-15 NOTE — Progress Notes (Signed)
[facility]   MULTIDISCIPLINARY HEART VALVE CLINIC                                       Cardiology Office Note    Date:  05/16/2019   ID:  Brandon Klein, DOB [birth year], MRN [id number]  PCP:  Brandon Klein, Elizabeth, MD  Cardiologist: Brandon Magicraci Turner, MD / Dr. Clifton JamesMcAlhany & Dr. Cornelius Moraswen (TAVR)  CC: 1 month s/p TAVR  History of Present Illness:  Brandon Klein is a 78 y.o. 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   He has a hx of CAD and underwent 4V CABG in 2015. He has had a pacemaker implanted remotely for bradycardia and syncope. He has paroxysmal atrial fibrillation and is on coumadin. His aortic stenosis has been followed for several years. Echo 01/31/19 showed LVEF=45-50% with severe AS. TAVR workup delayed while he completed his planned cataract surgery. Cardiac cath 03/21/19 with 3/4 patent bypass grafts. The SVG to the PDA is known to be occluded. A drug eluting stent was placed in the distal RCA.   He underwent successful TAVR with a 29 mm Edwards Sapien 3 THV via the TF approach on 04/08/19. Post operative echo showed EF 50-55% with normally functioning TAVR with a mean gradient of 12 mm Hg and no PVL. He has done well in follow up.  Today he presents to clinic for follow up. He is mostly limited by knee pain. No CP or SOB. No LE edema, orthopnea or PND. No dizziness or syncope. No blood in stool or urine. No palpitations.   Past Medical History:  Diagnosis Date  . Chronic renal insufficiency   . Coronary artery disease    a. s/p PCI of LAD 1997. b. Cutting balloon angioplasty to LCx in 2002. c. s/p cath 11/2013 with severe 3 vessel ASCAD s/p CABG with LIMA to LAD, SVG to diag, SVG to left circ and SVG to PDA. d. cath  07/07/2014 occluded SVG to PDA, all other grafts patent. 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 DJD (degenerative joint disease)   . Epistaxis   . GERD (gastroesophageal reflux disease)   . GI bleeding    a.  with benign gastric polypectomy 05/2014.  Marland Kitchen. HLD (hyperlipidemia)   . HTN (hypertension)   . Obesity   . OSA (obstructive sleep apnea)   . Pacemaker    a. 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 b. MDT generator replacement 2012.  Marland Kitchen. PAF (paroxysmal atrial fibrillation) (HCC)    on chronic systemic anticoagulation  . PVC's (premature ventricular contractions)   . 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 Severe aortic stenosis   . Syncope    a. vasovagal with documented bradycardia.  . Type 2 diabetes mellitus without complications (HCC) 12/02/2015    Past Surgical History:  Procedure Laterality Date  . ANGIOPLASTY  1997  . CHOLECYSTECTOMY    . COLONOSCOPY WITH PROPOFOL N/A 05/05/2014  Procedure: COLONOSCOPY WITH PROPOFOL;  Surgeon: Charolett BumpersMartin K Johnson, MD;  Location: [facility] ENDOSCOPY;  Service: Endoscopy;  Laterality: N/A;  . CORONARY ARTERY BYPASS GRAFT N/A 12/01/2013   Procedure: CORONARY ARTERY BYPASS GRAFT TIMES FOUR USING LEFT INTERNAL MAMMARY ARTERY TO LAD, SAPHENOUS VEIN GRAFTS TO DIAGONAL, CIRCUMFELX AND POSTERIOR DESCENDING;  Surgeon: Brandon PernaPeter Van Trigt, MD;  Location: [facility] OR;  Service: Open Heart Surgery;  Laterality: N/A;  . CORONARY STENT INTERVENTION N/A 03/21/2019   Procedure: CORONARY STENT INTERVENTION;  Surgeon: Kathleene HazelMcAlhany, Christopher D, MD;  Location: [facility] INVASIVE CV LAB;  Service: Cardiovascular;  Laterality: N/A;  Distal RCA  . ESOPHAGOGASTRODUODENOSCOPY (EGD) WITH PROPOFOL N/A 05/05/2014   Procedure: ESOPHAGOGASTRODUODENOSCOPY (EGD) WITH PROPOFOL;  Surgeon: Charolett BumpersMartin K Johnson, MD;  Location: [facility];   Service: Endoscopy;  Laterality: N/A;  . KNEE ARTHROSCOPY  2000   Left  . LEFT AND RIGHT HEART CATHETERIZATION WITH CORONARY/GRAFT ANGIOGRAM N/A 07/07/2014   Procedure: LEFT AND RIGHT HEART CATHETERIZATION WITH Brandon CapriceORONARY/GRAFT ANGIOGRAM;  Surgeon: Kathleene Hazelhristopher D McAlhany, MD;  [facility];  Service: Cardiovascular;  Laterality: N/A;  . LEFT HEART CATHETERIZATION WITH CORONARY ANGIOGRAM N/A 11/25/2013   Procedure: LEFT HEART CATHETERIZATION WITH CORONARY ANGIOGRAM;  Surgeon: Brandon Reichertraci R Turner, MD;  Location: [facility] CATH LAB;  Service: Cardiovascular;  Laterality: N/A;  . PACEMAKER INSERTION  2002   generator change MDT by Dr Brandon Klein in 2012  . RIGHT/LEFT HEART CATH AND CORONARY/GRAFT ANGIOGRAPHY N/A 03/21/2019   Procedure: RIGHT/LEFT HEART CATH AND CORONARY/GRAFT ANGIOGRAPHY;  Surgeon: Kathleene HazelMcAlhany, Christopher D, MD;  [facility]: [facility];  Service: Cardiovascular;  Laterality: N/A;  . TEE WITHOUT CARDIOVERSION N/A 08/31/2014   Procedure: TRANSESOPHAGEAL ECHOCARDIOGRAM (TEE);  Surgeon: Brandon Reichertraci R Turner, MD;  [facility];  Service: Cardiovascular;  Laterality: N/A;  . TEE WITHOUT CARDIOVERSION N/A 04/08/2019   Procedure: TRANSESOPHAGEAL ECHOCARDIOGRAM (TEE);  Surgeon: Kathleene HazelMcAlhany, Christopher D, MD;  [facility];  Service: Open Heart Surgery;  Laterality: N/A;  . TRANSCATHETER AORTIC VALVE REPLACEMENT, TRANSFEMORAL  04/08/2019  . TRANSCATHETER AORTIC VALVE REPLACEMENT, TRANSFEMORAL N/A 04/08/2019   Procedure: TRANSCATHETER AORTIC VALVE REPLACEMENT, TRANSFEMORAL;  Surgeon: Kathleene HazelMcAlhany, Christopher D, MD;  Location: [facility] OR;  Service: Open Heart Surgery;  Laterality: N/A;    Current Medications: Outpatient Medications Prior to Visit  Medication Sig Dispense Refill  . acetaminophen (TYLENOL) 500 MG tablet Take 1,000 mg by mouth 3 (three) times daily.     Marland Kitchen. atorvastatin (LIPITOR) 80 MG tablet TAKE 1 TABLET BY MOUTH  DAILY 90 tablet 2  . carvedilol (COREG) 25 MG tablet TAKE 1 TABLET(25 MG) BY  MOUTH TWICE DAILY WITH A MEAL 180 tablet 2  . clindamycin (CLEOCIN) 300 MG capsule Take 2 pills (600 mg) 2 hours before your dental procedure 12 capsule 0  . clopidogrel (PLAVIX) 75 MG tablet Take 1 tablet (75 mg total) by mouth daily with breakfast. 90 tablet 3  . docusate sodium (COLACE) 100 MG capsule Take 200 mg by mouth 2 (two) times daily as needed for mild constipation.     . fexofenadine (ALLEGRA) 180 MG tablet Take 180 mg by mouth daily.     . fluticasone (FLONASE) 50 MCG/ACT nasal spray Place 2 sprays into the nose daily.     . furosemide (LASIX) 20 MG tablet TAKE 1 TABLET BY MOUTH TWICE DAILY. WITH 80 MG TO TOTAL 100MG  180 tablet 2  . furosemide (LASIX) 80 MG tablet TAKE 1 TABLET BY MOUTH TWICE DAILY. TAKE WITH 20 MG TABLET 180 tablet 2  .  Glucosamine-Chondroit-Vit C-Mn (GLUCOSAMINE CHONDR 1500 COMPLX) CAPS Take 1 capsule by mouth every morning.     . hydrALAZINE (APRESOLINE) 25 MG tablet TAKE 1 TABLET(25 MG) BY MOUTH THREE TIMES DAILY 270 tablet 2  . isosorbide mononitrate (IMDUR) 30 MG 24 hr tablet TAKE 1 TABLET BY MOUTH EVERY DAY 90 tablet 2  . L-Lysine 1000 MG TABS Take 1,000 mg by mouth daily.     . meclizine (ANTIVERT) 25 MG tablet Take 25 mg by mouth daily as needed for dizziness.     . Multiple Vitamin (MULTIVITAMIN) tablet Take 1 tablet by mouth daily. Centrum silver    . nitroGLYCERIN (NITROSTAT) 0.4 MG SL tablet Place 1 tablet (0.4 mg total) under the tongue every 5 (five) minutes as needed for chest pain. 30 tablet 3  . ONE TOUCH ULTRA TEST test strip 1 each by Other route every morning.     Letta Pate DELICA LANCETS FINE MISC 1 each by Other route every morning.     . pantoprazole (PROTONIX) 40 MG tablet Take 40 mg by mouth daily.     . potassium chloride SA (K-DUR) 20 MEQ tablet TAKE 2 TABLETS BY MOUTH TWICE DAILY 360 tablet 2  . RA NATURAL MAGNESIUM 250 MG TABS TAKE 2 TABLETS TWICE DAILY. 120 tablet 5  . spironolactone (ALDACTONE) 25 MG tablet TAKE 1 TABLET(25 MG) BY  MOUTH DAILY 90 tablet 2  . warfarin (COUMADIN) 5 MG tablet Take 1 to 1.5 tablets daily as directed by Coumadin clinic (Patient taking differently: Take 5-7.5 mg by mouth at bedtime. Take 5 mg on Tuesday and Thursday Take 7.5 mg all the other days) 135 tablet 1  . Icosapent Ethyl (VASCEPA) 1 g CAPS Take 2 capsules (2 g total) by mouth 2 (two) times daily. (Patient not taking: Reported on 05/13/2019) 360 capsule 4   No facility-administered medications prior to visit.     Allergies:   Acetaminophen-codeine, Vascepa [icosapent ethyl], Amoxicillin, and Diovan [valsartan]   Social History   Socioeconomic History  . Marital status: Married    Spouse name: Not on file  . Number of children: Not on file  . Years of education: Not on file  . Highest education level: Not on file  Occupational History  . Not on file  Tobacco Use  . Smoking status: Former Smoker    Packs/day: 2.00    Years: 5.00    Pack years: 10.00    Types: Cigarettes    Quit date: 05/29/1966    Years since quitting: 53.0  . Smokeless tobacco: Never Used  Substance and Sexual Activity  . Alcohol use: Yes    Alcohol/week: 0.0 standard drinks    Comment: ocassional  . Drug use: No  . Sexual activity: Not on file  Other Topics Concern  . Not on file  Social History Narrative   Lives in [place] with spouse.  Retired Medical illustrator   Social Determinants of Corporate investment banker Strain:   . Difficulty of Paying Living Expenses: Not on file  Food Insecurity:   . Worried About Programme researcher, broadcasting/film/video in the Last Year: Not on file  . Ran Out of Food in the Last Year: Not on file  Transportation Needs:   . Lack of Transportation (Medical): Not on file  . Lack of Transportation (Non-Medical): Not on file  Physical Activity:   . Days of Exercise per Week: Not on file  . Minutes of Exercise per Session: Not on file  Stress:   .  Feeling of Stress : Not on file  Social Connections:   . Frequency of Communication with  Friends and Family: Not on file  . Frequency of Social Gatherings with Friends and Family: Not on file  . Attends Religious Services: Not on file  . Active Member of Clubs or Organizations: Not on file  . Attends Banker Meetings: Not on file  . Marital Status: Not on file     Family History:  The patient's family history includes Colon cancer in his mother; Heart attack in his brother and mother; Leukemia in his father.     ROS:   Please see the history of present illness.    ROS All other systems reviewed and are negative.   PHYSICAL EXAM:   VS:  BP 128/68   Pulse 66   Ht  (1.702 m)   Wt 242 lb (109.8 kg)   SpO2 95%   BMI 37.90 kg/m    GEN: Well nourished, well developed, in no acute distress HEENT: normal Neck: no JVD or masses Cardiac: RRR; no murmurs, rubs, or gallops,no edema  Respiratory:  clear to auscultation bilaterally, normal work of breathing GI: soft, nontender, nondistended, + BS MS: no deformity or atrophy Skin: warm and dry, no rash Neuro:  Alert and Oriented x 3, Strength and sensation are intact Psych: euthymic mood, full affect   Wt Readings from Last 3 Encounters:  05/15/19 242 lb (109.8 kg)  04/18/19 243 lb 12.8 oz (110.6 kg)  04/09/19 240 lb 4.8 oz (109 kg)      Studies/Labs Reviewed:   EKG:  EKG is NOT ordered today.    Recent Labs: 04/04/2019: ALT 24; B Natriuretic Peptide 70.7 04/09/2019: BUN 22; Creatinine, Ser 1.26; Hemoglobin 14.4; Magnesium 2.1; Platelets 114; Potassium 4.1; Sodium 136   Lipid Panel    Component Value Date/Time   CHOL 112 03/26/2019 0910   CHOL 113 07/22/2014 1226   TRIG 238 (H) 03/26/2019 0910   TRIG 91 07/22/2014 1226   HDL 39 (L) 03/26/2019 0910   HDL 40 07/22/2014 1226   CHOLHDL 2.9 03/26/2019 0910   CHOLHDL 3.4 06/04/2015 1216   VLDL 39 (H) 06/04/2015 1216   LDLCALC 36 03/26/2019 0910   LDLCALC 55 07/22/2014 1226   LDLDIRECT 47.9 07/14/2013 0937    Additional studies/ records that  were reviewed today include:  TAVR OPERATIVE NOTE   Date of Procedure:04/08/2019  Preoperative Diagnosis:Severe Aortic Stenosis   Postoperative Diagnosis:Same   Procedure:   Transcatheter Aortic Valve Replacement - PercutaneousRightTransfemoral Approach Edwards Sapien 3 THV (size 29mm, model # 9600TFX, serial [id number])  Co-Surgeons:Clarence H. Cornelius Moras, MD and Verne Carrow, MD  Anesthesiologist:Charlene Chilton Si, MD  Echocardiographer:Peter Eden Emms, MD  Pre-operative Echo Findings: ? Severe aortic stenosis ? Normalleft ventricular systolic function  Post-operative Echo Findings: ? Noparavalvular leak ? Normalleft ventricular systolic function  _____________    Echo 04/09/19: IMPRESSIONS  1. Left ventricular ejection fraction, by visual estimation, is 50 to 55%. The left ventricle has normal function. Left ventricular septal wall thickness was mildly increased. Mildly increased left ventricular posterior wall thickness. There is mildly  increased left ventricular hypertrophy.  2. Left ventricular diastolic parameters are consistent with Grade I diastolic dysfunction (impaired relaxation).  3. Global right ventricle has normal systolic function.The right ventricular size is normal. No increase in right ventricular wall thickness.  4. Left atrial size was mildly dilated.  5. Right atrial size was mildly dilated.  6. The mitral valve is normal in structure.  Trace mitral valve regurgitation. No evidence of mitral stenosis.  7. The tricuspid valve is normal in structure. Tricuspid valve regurgitation is trivial.  8. Aortic valve regurgitation is not visualized. Mild aortic valve stenosis.  9. The pulmonic valve was normal in structure. Pulmonic valve regurgitation is not visualized. 10. Normal pulmonary artery systolic pressure. 11. The inferior  vena cava is normal in size with greater than 50% respiratory variability, suggesting right atrial pressure of 3 mmHg.  Aortic Valve: The aortic valve has been repaired/replaced. Aortic valve regurgitation is not visualized. Mild aortic stenosis is present. Aortic valve mean gradient measures 12.0 mmHg. Aortic valve peak gradient measures 24.2 mmHg. Aortic valve area, by  VTI measures 2.44 cm. 58mm Edwards Sapien bioprosthetic, stented aortic valve (TAVR) valve is present in the aortic position.  _____________    Echo 05/09/19 IMPRESSIONS    1. Left ventricular ejection fraction, by visual estimation, is 40 to 45%. The left ventricle has mild to moderately decreased function. There is mildly increased left ventricular hypertrophy.  2. Left ventricular diastolic parameters are consistent with Grade I diastolic dysfunction (impaired relaxation).  3. The left ventricle demonstrates global hypokinesis.  4. Global right ventricle has normal systolic function.The right ventricular size is mildly enlarged. No increase in right ventricular wall thickness.  5. Left atrial size was moderately dilated.  6. Right atrial size was moderately dilated.  7. The mitral valve is normal in structure. Mild mitral valve regurgitation. No evidence of mitral stenosis.  8. The tricuspid valve is normal in structure. Tricuspid valve regurgitation is mild.  9. Aortic valve regurgitation is not visualized. No evidence of aortic valve sclerosis or stenosis. 10. The pulmonic valve was normal in structure. Pulmonic valve regurgitation is not visualized. 11. Normal pulmonary artery systolic pressure. 12. The tricuspid regurgitant velocity is 1.98 m/s, and with an assumed right atrial pressure of 3 mmHg, the estimated right ventricular systolic pressure is normal at 18.7 mmHg. 13. A pacer wire is visualized in the RV and RA. 14. The inferior vena cava is normal in size with greater than 50% respiratory variability,  suggesting right atrial pressure of 3 mmHg. 15. Normal transaortic gradients peak/mean 19/11 mmHg, trivial paravalvular leak. LVEF has decreased now 40-45% with diffuse hypokinesis.  FINDINGS  Left Ventricle: Left ventricular ejection fraction, by visual estimation, is 40 to 45%. The left ventricle has mild to moderately decreased function. The left ventricle demonstrates global hypokinesis. There is mildly increased left ventricular  hypertrophy. Concentric left ventricular hypertrophy. Left ventricular diastolic parameters are consistent with Grade I diastolic dysfunction (impaired relaxation). Normal left atrial pressure.  Right Ventricle: The right ventricular size is mildly enlarged. No increase in right ventricular wall thickness. Global RV systolic function is has normal systolic function. The tricuspid regurgitant velocity is 1.98 m/s, and with an assumed right atrial  pressure of 3 mmHg, the estimated right ventricular systolic pressure is normal at 18.7 mmHg.  Left Atrium: Left atrial size was moderately dilated.  Right Atrium: Right atrial size was moderately dilated  Pericardium: There is no evidence of pericardial effusion.  Mitral Valve: The mitral valve is normal in structure. Mild mitral valve regurgitation. No evidence of mitral valve stenosis by observation.  Tricuspid Valve: The tricuspid valve is normal in structure. Tricuspid valve regurgitation is mild.  Aortic Valve: The aortic valve has been repaired/replaced. Aortic valve regurgitation is not visualized. The aortic valve is structurally normal, with no evidence of sclerosis or stenosis. Aortic valve mean gradient measures 11.0 mmHg.  Aortic valve peak  gradient measures 18.3 mmHg. Aortic valve area, by VTI measures 1.94 cm. 93mm Edwards Sapien bioprosthetic, stented aortic valve (TAVR) valve is present in the aortic position.   ASSESSMENT & PLAN:   Severe AS s/p TAVR: echo today shows EF 40-45%, normally  functioning TAVR with mean gradient of 11 mm hg and no PVL. He has NYHA class I symptoms, but his mobility is mostly limited by knee pain. SBE prophylaxis discussed; he has clindamycin.  Continue plavix and coumadin for now. Plan to stop plavix after 6 months of therapy and restart a baby aspirin at that time.   CAD: pre TAVR cath showed severe triple vessel CAD s/p 4V CABG with 3/4 patent bypass grafts. There was severe stenosis distal RCA s/p successful PTCA/DES x 1 distal RCA. Continue medical therapy. He was treated with triple therapy x 1 month and dropped aspirin on 11/23. Continue on plavix and coumadin x 6 months. After 6 months, plavix can be dropped and will restart aspirin   PAF: sounds regular on exam. Continue on coumadin.   Chronic combined S/D CHF: EF 40-45% today. Continue spiro and lasix.  HTN: BP well controlled today.   Medication Adjustments/Labs and Tests Ordered: Current medicines are reviewed at length with the patient today.  Concerns regarding medicines are outlined above.  Medication changes, Labs and Tests ordered today are listed in the Patient Instructions below. Patient Instructions  Medication Instructions:  1) DISCONTINUE Plavix when you run out of pills. 2) START Aspirin 81mg  once daily when you stop the Plavix  *If you need a refill on your cardiac medications before your next appointment, please call your pharmacy*  Lab Work: None If you have labs (blood work) drawn today and your tests are completely normal, you will receive your results only by: Marland Kitchen MyChart Message (if you have MyChart) OR . A paper copy in the mail If you have any lab test that is abnormal or we need to change your treatment, we will call you to review the results.  Testing/Procedures: None  Follow-Up: At [facility], you and your health needs are our priority.  As part of our continuing mission to provide you with exceptional heart care, we have created designated Provider  Care Teams.  These Care Teams include your primary Cardiologist (physician) and Advanced Practice Providers (APPs -  Physician Assistants and Nurse Practitioners) who all work together to provide you with the care you need, when you need it.  Your next appointment:   12 month(s)  The format for your next appointment:   In Person  Provider:   Nell Range, PA-C  Other Instructions     Signed, Angelena Form, PA-C  05/16/2019 [address], [place], [place]  [address] Phone: [phone]; Fax: [phone]

## 2019-05-15 NOTE — Patient Instructions (Signed)
Description   Hold today, then continue taking same dosage 1.5 tablets every day except 1 tablet on Tuesdays and Thursdays.  Recheck INR 3 weeks.  Coumadin clinic #[phone].    

## 2019-05-15 NOTE — Patient Instructions (Signed)
Medication Instructions:  1) DISCONTINUE Plavix when you run out of pills. 2) START Aspirin 81mg  once daily when you stop the Plavix  *If you need a refill on your cardiac medications before your next appointment, please call your pharmacy*  Lab Work: None If you have labs (blood work) drawn today and your tests are completely normal, you will receive your results only by: Marland Kitchen MyChart Message (if you have MyChart) OR . A paper copy in the mail If you have any lab test that is abnormal or we need to change your treatment, we will call you to review the results.  Testing/Procedures: None  Follow-Up: At [facility], you and your health needs are our priority.  As part of our continuing mission to provide you with exceptional heart care, we have created designated Provider Care Teams.  These Care Teams include your primary Cardiologist (physician) and Advanced Practice Providers (APPs -  Physician Assistants and Nurse Practitioners) who all work together to provide you with the care you need, when you need it.  Your next appointment:   12 month(s)  The format for your next appointment:   In Person  Provider:   Nell Range, PA-C  Other Instructions

## 2019-05-15 NOTE — Telephone Encounter (Signed)
Patient requested to speak with me durning his coumadin visit. He told me about his reaction to Vascepa (he had already called and told me the same thing over the phone the other week). States he wont take. Advised that it was ok not to take. Provided him some literature on low carbs and low TG/fat diets

## 2019-05-16 ENCOUNTER — Telehealth (HOSPITAL_COMMUNITY): Payer: Self-pay

## 2019-05-16 NOTE — Telephone Encounter (Signed)
Called to advise pt that given the surge/increase in the Covid-19 cases and hospitalizations, our [place] senior leadership has asked [place] to halt onsite Cardiac and Pulmonary rehab exercise sessions and move patients to the Virtual app we have with [facility]. The reason for this is so that department staff can be deployed to the inpatient nursing floors to assist those teams in need. Leadership anticipates this need will be 30-60 days however, it could be extended if needed. Advised pt to attend the Orientation assessment appt for 05/27/2019@9 :30am, but pt cancel that because he is not interested in virtual cardiac rehab. Pt verbalized understanding and is in agreement. He would like a call back when we are open back with in house cardiac rehab.

## 2019-05-27 ENCOUNTER — Ambulatory Visit (HOSPITAL_COMMUNITY): Payer: Medicare Other

## 2019-05-28 ENCOUNTER — Other Ambulatory Visit: Payer: Self-pay

## 2019-05-28 ENCOUNTER — Ambulatory Visit: Payer: Medicare Other | Admitting: Pharmacist

## 2019-05-28 DIAGNOSIS — Z5181 Encounter for therapeutic drug level monitoring: Secondary | ICD-10-CM | POA: Diagnosis not present

## 2019-05-28 DIAGNOSIS — I480 Paroxysmal atrial fibrillation: Secondary | ICD-10-CM | POA: Diagnosis not present

## 2019-05-28 LAB — POCT INR: INR: 3.5 — AB (ref 2.0–3.0)

## 2019-05-28 NOTE — Patient Instructions (Signed)
Description   Hold today, then continue taking same dosage 1.5 tablets every day except 1 tablet on Tuesdays and Thursdays.  Recheck INR 3 weeks.  Coumadin clinic [phone].

## 2019-06-02 ENCOUNTER — Ambulatory Visit (HOSPITAL_COMMUNITY): Payer: Medicare Other

## 2019-06-04 ENCOUNTER — Ambulatory Visit (HOSPITAL_COMMUNITY): Payer: Medicare Other

## 2019-06-06 ENCOUNTER — Ambulatory Visit (HOSPITAL_COMMUNITY): Payer: Medicare Other

## 2019-06-06 NOTE — Progress Notes (Signed)
PPM remote 

## 2019-06-09 ENCOUNTER — Ambulatory Visit (HOSPITAL_COMMUNITY): Payer: Medicare Other

## 2019-06-11 ENCOUNTER — Ambulatory Visit (HOSPITAL_COMMUNITY): Payer: Medicare Other

## 2019-06-13 ENCOUNTER — Ambulatory Visit (HOSPITAL_COMMUNITY): Payer: Medicare Other

## 2019-06-16 ENCOUNTER — Ambulatory Visit (HOSPITAL_COMMUNITY): Payer: Medicare Other

## 2019-06-18 ENCOUNTER — Other Ambulatory Visit: Payer: Self-pay

## 2019-06-18 ENCOUNTER — Ambulatory Visit (HOSPITAL_COMMUNITY): Payer: Medicare Other

## 2019-06-18 ENCOUNTER — Ambulatory Visit (INDEPENDENT_AMBULATORY_CARE_PROVIDER_SITE_OTHER): Payer: Medicare Other | Admitting: *Deleted

## 2019-06-18 DIAGNOSIS — Z5181 Encounter for therapeutic drug level monitoring: Secondary | ICD-10-CM | POA: Diagnosis not present

## 2019-06-18 DIAGNOSIS — I480 Paroxysmal atrial fibrillation: Secondary | ICD-10-CM | POA: Diagnosis not present

## 2019-06-18 LAB — POCT INR: INR: 3.1 — AB (ref 2.0–3.0)

## 2019-06-18 NOTE — Patient Instructions (Addendum)
Description   Start taking same dosage 1.5 tablets every day except 1 tablet on Tuesdays, Thursdays and Saturdays.  Recheck INR 3 weeks.  Coumadin clinic [phone].

## 2019-06-19 ENCOUNTER — Ambulatory Visit: Payer: Medicare Other | Attending: Internal Medicine

## 2019-06-19 DIAGNOSIS — Z23 Encounter for immunization: Secondary | ICD-10-CM

## 2019-06-19 NOTE — Progress Notes (Signed)
   Covid-19 Vaccination Clinic  Name:  Brandon Klein    MRN: [id number] DOB: 1940/10/02  06/19/2019  [name] was observed post Covid-19 immunization for 15 minutes without incidence. He was provided with Vaccine Information Sheet and instruction to access the V-Safe system.   [name] was instructed to call 911 with any severe reactions post vaccine: Marland Kitchen Difficulty breathing  . Swelling of your face and throat  . A fast heartbeat  . A bad rash all over your body  . Dizziness and weakness    Immunizations Administered    Name Date Dose VIS Date Route   Pfizer COVID-19 Vaccine 06/19/2019  9:29 AM 0.3 mL 05/09/2019 Intramuscular   Manufacturer: [employer], [employer]   Lot: [id number]   NDC: [address]-4

## 2019-06-20 ENCOUNTER — Ambulatory Visit (HOSPITAL_COMMUNITY): Payer: Medicare Other

## 2019-06-23 ENCOUNTER — Ambulatory Visit (HOSPITAL_COMMUNITY): Payer: Medicare Other

## 2019-06-23 NOTE — Progress Notes (Signed)
Cardiology Office Note Date:  06/25/2019  Patient ID:  Brandon Klein, Brandon Klein Apr 21, 1941, MRN [id number] PCP:  Juluis Rainier, MD  Cardiologist:  Dr. [name] Electrophysiologist: Dr. Graciela Husbands    Chief Complaint:  Annual device visit  History of Present Illness: Brandon Klein is a 79 y.o. 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 Graciela Husbands, last seen by him Jan 2020.  At that visit he was doing well, no changes were made to his programming or medicines.  Since then he has undergone TAVR procedure Nov 2020.  He feels well.  Mentions he had cataract surgery and this has been huge for him, sees well has had a couple aura like symptoms the eye MD told his was likely a headache,  He has felt well since his PCI and TAVR.  Denies any CP, palpitations or cardiac awareness.  No dizziness (outside of his known occasional vertigo), no near syncope or syncope.   He says his knees are his primary exertional limitations, no SOB or DOE.  He will bleed more with trauma, minor skin scrapes on the Plavix with his warfarin, otherwise no bleeding or signs of bleeding.  Follows with the coumadin clinic    Device information: MDT dual chamber PPM, implanted 02/21/01, gen change 02/01/11, Dr. Ladona Ridgel, followed by Dr. Graciela Husbands   Past Medical History:  Diagnosis Date  . Chronic renal insufficiency   . Coronary artery disease    a. s/p PCI of LAD 1997. b. Cutting balloon angioplasty to LCx in 2002. c. s/p cath 11/2013 with severe 3 vessel ASCAD s/p CABG with LIMA to LAD, SVG to diag, SVG to left circ and SVG to PDA. d. cath 07/07/2014 occluded SVG to PDA, all other grafts patent. 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 DJD (degenerative joint disease)   . Epistaxis   . GERD  (gastroesophageal reflux disease)   . GI bleeding    a.  with benign gastric polypectomy 05/2014.  Marland Kitchen HLD (hyperlipidemia)   . HTN (hypertension)   . Obesity   . OSA (obstructive sleep apnea)   . Pacemaker    a. 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 b. MDT generator replacement 2012.  Marland Kitchen PAF (paroxysmal atrial fibrillation) (HCC)    on chronic systemic anticoagulation  . PVC's (premature ventricular contractions)   . 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 Severe aortic stenosis   . Syncope    a. vasovagal with documented bradycardia.  . Type 2 diabetes mellitus without complications (HCC) 12/02/2015    Past Surgical History:  Procedure Laterality Date  . ANGIOPLASTY  1997  . CHOLECYSTECTOMY    . COLONOSCOPY WITH PROPOFOL N/A 05/05/2014   Procedure: COLONOSCOPY WITH PROPOFOL;  Surgeon: Charolett Bumpers, MD;  Location: [facility] ENDOSCOPY;  Service: Endoscopy;  Laterality: N/A;  . CORONARY ARTERY BYPASS GRAFT N/A 12/01/2013   Procedure: CORONARY ARTERY BYPASS GRAFT TIMES FOUR USING LEFT INTERNAL MAMMARY ARTERY TO LAD, SAPHENOUS VEIN  GRAFTS TO DIAGONAL, CIRCUMFELX AND POSTERIOR DESCENDING;  Surgeon: Kerin Perna, MD;  Location: [facility] OR;  Service: Open Heart Surgery;  Laterality: N/A;  . CORONARY STENT INTERVENTION N/A 03/21/2019   Procedure: CORONARY STENT INTERVENTION;  Surgeon: Kathleene Hazel, MD;  Location: [facility] INVASIVE CV LAB;  Service: Cardiovascular;  Laterality: N/A;  Distal RCA  . ESOPHAGOGASTRODUODENOSCOPY (EGD) WITH PROPOFOL N/A 05/05/2014   Procedure: ESOPHAGOGASTRODUODENOSCOPY (EGD) WITH PROPOFOL;  Surgeon: Charolett Bumpers, MD;  Location: [facility] ENDOSCOPY;  Service: Endoscopy;  Laterality: N/A;  . KNEE ARTHROSCOPY  2000   Left  . LEFT AND RIGHT HEART CATHETERIZATION WITH CORONARY/GRAFT ANGIOGRAM  N/A 07/07/2014   Procedure: LEFT AND RIGHT HEART CATHETERIZATION WITH Isabel Caprice;  Surgeon: Kathleene Hazel, MD;  Location: [facility] CATH LAB;  Service: Cardiovascular;  Laterality: N/A;  . LEFT HEART CATHETERIZATION WITH CORONARY ANGIOGRAM N/A 11/25/2013   Procedure: LEFT HEART CATHETERIZATION WITH CORONARY ANGIOGRAM;  Surgeon: [name], MD;  Location: [facility] CATH LAB;  Service: Cardiovascular;  Laterality: N/A;  . PACEMAKER INSERTION  2002   generator change MDT by Dr Graciela Husbands in 2012  . RIGHT/LEFT HEART CATH AND CORONARY/GRAFT ANGIOGRAPHY N/A 03/21/2019   Procedure: RIGHT/LEFT HEART CATH AND CORONARY/GRAFT ANGIOGRAPHY;  Surgeon: Kathleene Hazel, MD;  Location: [facility] INVASIVE CV LAB;  Service: Cardiovascular;  Laterality: N/A;  . TEE WITHOUT CARDIOVERSION N/A 08/31/2014   Procedure: TRANSESOPHAGEAL ECHOCARDIOGRAM (TEE);  Surgeon: [name], MD;  Location: [facility] ENDOSCOPY;  Service: Cardiovascular;  Laterality: N/A;  . TEE WITHOUT CARDIOVERSION N/A 04/08/2019   Procedure: TRANSESOPHAGEAL ECHOCARDIOGRAM (TEE);  Surgeon: Kathleene Hazel, MD;  Location: [facility] OR;  Service: Open Heart Surgery;  Laterality: N/A;  . TRANSCATHETER AORTIC VALVE REPLACEMENT, TRANSFEMORAL  04/08/2019  . TRANSCATHETER AORTIC VALVE REPLACEMENT, TRANSFEMORAL N/A 04/08/2019   Procedure: TRANSCATHETER AORTIC VALVE REPLACEMENT, TRANSFEMORAL;  Surgeon: Kathleene Hazel, MD;  Location: [facility] OR;  Service: Open Heart Surgery;  Laterality: N/A;    Current Outpatient Medications  Medication Sig Dispense Refill  . acetaminophen (TYLENOL) 500 MG tablet Take 1,000 mg by mouth 3 (three) times daily.     Marland Kitchen atorvastatin (LIPITOR) 80 MG tablet TAKE 1 TABLET BY MOUTH  DAILY 90 tablet 2  . carvedilol (COREG) 25 MG tablet TAKE 1 TABLET(25 MG) BY MOUTH TWICE DAILY WITH A MEAL 180 tablet 2  . clindamycin (CLEOCIN) 300 MG capsule Take 2 pills (600 mg) 2 hours before your dental procedure 12 capsule 0  . clopidogrel  (PLAVIX) 75 MG tablet Take 1 tablet (75 mg total) by mouth daily with breakfast. 90 tablet 3  . docusate sodium (COLACE) 100 MG capsule Take 200 mg by mouth 2 (two) times daily as needed for mild constipation.     . fexofenadine (ALLEGRA) 180 MG tablet Take 180 mg by mouth daily.     . fluticasone (FLONASE) 50 MCG/ACT nasal spray Place 2 sprays into the nose daily.     . furosemide (LASIX) 20 MG tablet TAKE 1 TABLET BY MOUTH TWICE DAILY. WITH 80 MG TO TOTAL 100MG  180 tablet 2  . furosemide (LASIX) 80 MG tablet TAKE 1 TABLET BY MOUTH TWICE DAILY. TAKE WITH 20 MG TABLET 180 tablet 2  . Glucosamine-Chondroit-Vit C-Mn (GLUCOSAMINE CHONDR 1500 COMPLX) CAPS Take 1 capsule by mouth every morning.     . hydrALAZINE (APRESOLINE) 25 MG tablet TAKE 1 TABLET(25 MG) BY MOUTH THREE TIMES DAILY 270 tablet 2  . isosorbide mononitrate (IMDUR) 30 MG 24 hr tablet TAKE 1 TABLET BY  MOUTH EVERY DAY 90 tablet 2  . L-Lysine 1000 MG TABS Take 1,000 mg by mouth daily.     . meclizine (ANTIVERT) 25 MG tablet Take 25 mg by mouth daily as needed for dizziness.     . Multiple Vitamin (MULTIVITAMIN) tablet Take 1 tablet by mouth daily. Centrum silver    . nitroGLYCERIN (NITROSTAT) 0.4 MG SL tablet Place 1 tablet (0.4 mg total) under the tongue every 5 (five) minutes as needed for chest pain. 30 tablet 3  . ONE TOUCH ULTRA TEST test strip 1 each by Other route every morning.     Letta Pate DELICA LANCETS FINE MISC 1 each by Other route every morning.     . pantoprazole (PROTONIX) 40 MG tablet Take 40 mg by mouth daily.     . potassium chloride SA (K-DUR) 20 MEQ tablet TAKE 2 TABLETS BY MOUTH TWICE DAILY 360 tablet 2  . RA NATURAL MAGNESIUM 250 MG TABS TAKE 2 TABLETS TWICE DAILY. 120 tablet 5  . spironolactone (ALDACTONE) 25 MG tablet TAKE 1 TABLET(25 MG) BY MOUTH DAILY 90 tablet 2  . warfarin (COUMADIN) 5 MG tablet Take 1 to 1.5 tablets daily as directed by Coumadin clinic (Patient taking differently: Take 5-7.5 mg by mouth at  bedtime. Take 5 mg on Tuesday and Thursday Take 7.5 mg all the other days) 135 tablet 1   No current facility-administered medications for this visit.    Allergies:   Acetaminophen-codeine, Vascepa [icosapent ethyl], Amoxicillin, and Diovan [valsartan]   Social History:  The patient  reports that he quit smoking about 53 years ago. His smoking use included cigarettes. He has a 10.00 pack-year smoking history. He has never used smokeless tobacco. He reports current alcohol use. He reports that he does not use drugs.   Family History:  The patient's family history includes Colon cancer in his mother; Heart attack in his brother and mother; Leukemia in his father.  ROS:  Please see the history of present illness.  All other systems are reviewed and otherwise negative.   PHYSICAL EXAM:  VS:  BP 118/66   Pulse 68   Ht 5\' 7"  (1.702 m)   Wt 246 lb (111.6 kg)   SpO2 96%   BMI 38.53 kg/m  BMI: Body mass index is 38.53 kg/m. Well nourished, well developed, in no acute distress  HEENT: normocephalic, atraumatic  Neck: no JVD, carotid bruits or masses Cardiac:  RRR; no significant murmurs, no rubs, or gallops Lungs:  CTA b/l, no wheezing, rhonchi or rales  Abd: soft, non-tender, obese MS: no deformity or atrophy Ext:  no edema  Skin: warm and dry, no rash Neuro:  No gross deficits appreciated Psych: euthymic mood, full affect  PPM site: stable, no tethering, skin change, edema or tenderness   EKG:  Not done today   PPM interrogation done today and reviewed by myself:  Battery and lead measurements are good 4 NSVT episodes (one was his TAVR date, this was the longest episode), last was 04/27/2019, 4 seconds VP 0.5%   05/15/2019 TTE IMPRESSIONS 1. Left ventricular ejection fraction, by visual estimation, is 40 to 45%. The left ventricle has mild to moderately decreased function. There is mildly increased left ventricular hypertrophy.  2. Left ventricular diastolic parameters are  consistent with Grade I diastolic dysfunction (impaired relaxation).  3. The left ventricle demonstrates global hypokinesis.  4. Global right ventricle has normal systolic function.The right ventricular size is mildly enlarged. No increase in right ventricular wall thickness.  5. Left atrial size was moderately dilated.  6. Right atrial size was moderately dilated.  7. The mitral valve is normal in structure. Mild mitral valve regurgitation. No evidence of mitral stenosis.  8. The tricuspid valve is normal in structure. Tricuspid valve regurgitation is mild.  9. Aortic valve regurgitation is not visualized. No evidence of aortic valve sclerosis or stenosis. 10. The pulmonic valve was normal in structure. Pulmonic valve regurgitation is not visualized. 11. Normal pulmonary artery systolic pressure. 12. The tricuspid regurgitant velocity is 1.98 m/s, and with an assumed right atrial pressure of 3 mmHg, the estimated right ventricular systolic pressure is normal at 18.7 mmHg. 13. A pacer wire is visualized in the RV and RA. 14. The inferior vena cava is normal in size with greater than 50% respiratory variability, suggesting right atrial pressure of 3 mmHg. 15. Normal transaortic gradients peak/mean 19/11 mmHg, trivial paravalvular leak. LVEF has decreased now 40-45% with diffuse hypokinesis.    03/21/2019: LHC/PCI  Mid RCA lesion is 30% stenosed.  Dist RCA-1 lesion is 90% stenosed.  Dist RCA-2 lesion is 90% stenosed.  SVG graft was not injected.  Origin to Prox Graft lesion is 100% stenosed.  Ramus lesion is 80% stenosed.  Mid Cx lesion is 100% stenosed.  SVG graft was visualized by angiography.  SVG graft was visualized by angiography.  Mid LAD lesion is 100% stenosed.  A drug-eluting stent was successfully placed using a STENT SYNERGY DES [id number].  Post intervention, there is a 40% residual stenosis.  Post intervention, there is a 0% residual stenosis.   1. Severe triple  vessel CAD s/p 4V CABG with 3/4 patent bypass grafts 2. Known occlusion SVG to PDA. Severe stenosis distal RCA.  3. Successful PTCA/DES x 1 distal RCA.  4. Chronic occlusion mid LAD. Patent LIMA to mid LAD. Patent SVG to Diagonal 5. Chronic occlusion mid Circumflex. Patent SVG to OM.  6. Severe disease in the small to moderate caliber intermediate branch 7. Severe aortic stenosis  By echo. Cath data with mean gradient 23.3 mmHg, peak to peak gradient 32 mm Hg).   Will continue planning for TAVR. Will continue ASA, Plavix and coumadin for now. Will continue DAPT with ASA and Plavix until after his TAVR procedure.    10/216/18, lexiscan stress myoview  Nuclear stress EF: 42%. Ejection fraction on echocardiogram was 45%.  There was no ST segment deviation noted during stress.  This is a low risk study. No perfusion defects identified. Ejection fraction is mildly reduced, consistent with echocardiogram.  02/08/17: TTE Study Conclusions - Left ventricle: Abnormal septal motion mid and basal inferior   wall hypokinesis. Systolic function was mildly reduced. The   estimated ejection fraction was in the range of 45% to 50%.   Doppler parameters are consistent with abnormal left ventricular   relaxation (grade 1 diastolic dysfunction). - Aortic valve: Gradients actually appear lower than those recorded   on 02/07/16. There was moderate stenosis. Valve area (VTI): 0.74   cm^2. Valve area (Vmax): 0.7 cm^2. Valve area (Vmean): 0.74 cm^2. - Left atrium: The atrium was moderately dilated. - Atrial septum: No defect or patent foramen ovale was identified.  02/07/16: TTE, LVEF 40% 02/23/15: TTE, LVEF 35-40% 09/28/14: TTE, LVEF 25-30%, probable severe AS, dobutamine done described as moderate  07/07/14: LHC Impression: 1. 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 Moderately severe distal RCA stenosis, this vessel is no longer protected by the graft 3. Ischemic  cardiomyopathy  4. Moderate aortic stenosis although severity may be underestimated given low LVEF Recommendations: He appears to remain volume overloaded. I would resume diuresis today. I cannot explain the drop in his LVEF by the occlusion of the graft to the PDA. His native RCA has moderately severe distal disease but good flow. This could be easily approached with PCI in the future. For now, would not recommend PCI of the RCA.   Recent Labs: 04/04/2019: ALT 24; B Natriuretic Peptide 70.7 04/09/2019: BUN 22; Creatinine, Ser 1.26; Hemoglobin 14.4; Magnesium 2.1; Platelets 114; Potassium 4.1; Sodium 136  03/26/2019: Chol/HDL Ratio 2.9; Cholesterol, Total 112; HDL 39; LDL Chol Calc (NIH) 36; Triglycerides 238   CrCl cannot be calculated (Patient's most recent lab result is older than the maximum 21 days allowed.).   Wt Readings from Last 3 Encounters:  06/25/19 246 lb (111.6 kg)  05/15/19 242 lb (109.8 kg)  04/18/19 243 lb 12.8 oz (110.6 kg)     Other studies reviewed: Additional studies/records reviewed today include: summarized above  ASSESSMENT AND PLAN:  1. PPM     Intact function, no programming changes made  2. CAD     No anginal complaints     On Plavix, statin, BB     C/w Dr.Turner    3. ICM, LVEF has improved over the years     Exam appears euvolemic, no symptoms of fluid OL     pt weighs daily, reports stable     On BB, diuretic tx, no ACE/ARB, has hx of CRI, LVEF remains 45-50%  4. Paroxysmal Afib     CHA2DS2Vasc is at least 5, on warfarin     Monitored with the coumadin clinic     0% burden  5. HTN     Looks OK, no changes  6. 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 VHD     S/p TAVR Nov 2020     Echo Dec 2020 looked OK            Disposition:  He sees Dr. [name] in March, will continue remotes Q 3  Mo and in-clinic with EP in 1 year, sooner if needed   Current medicines are reviewed at length with the patient today.  The patient did  not have any concerns regarding medicines.  Norma Fredrickson, PA-C 06/25/2019 2:36 PM     [facility] [address] [place] [address] [phone] (office)  [phone] (fax)

## 2019-06-25 ENCOUNTER — Ambulatory Visit: Payer: Medicare Other | Admitting: Physician Assistant

## 2019-06-25 ENCOUNTER — Other Ambulatory Visit: Payer: Self-pay

## 2019-06-25 ENCOUNTER — Ambulatory Visit (HOSPITAL_COMMUNITY): Payer: Medicare Other

## 2019-06-25 VITALS — BP 118/66 | HR 68 | Ht 67.0 in | Wt 246.0 lb

## 2019-06-25 DIAGNOSIS — I480 Paroxysmal atrial fibrillation: Secondary | ICD-10-CM | POA: Diagnosis not present

## 2019-06-25 DIAGNOSIS — I4729 Other ventricular tachycardia: Secondary | ICD-10-CM

## 2019-06-25 DIAGNOSIS — Z950 Presence of cardiac pacemaker: Secondary | ICD-10-CM

## 2019-06-25 DIAGNOSIS — I2510 Atherosclerotic heart disease of native coronary artery without angina pectoris: Secondary | ICD-10-CM | POA: Diagnosis not present

## 2019-06-25 DIAGNOSIS — I472 Ventricular tachycardia: Secondary | ICD-10-CM

## 2019-06-25 DIAGNOSIS — Z952 Presence of prosthetic heart valve: Secondary | ICD-10-CM

## 2019-06-25 DIAGNOSIS — I255 Ischemic cardiomyopathy: Secondary | ICD-10-CM

## 2019-06-25 NOTE — Patient Instructions (Signed)
Medication Instructions:  Your physician recommends that you continue on your current medications as directed. Please refer to the Current Medication list given to you today.  *If you need a refill on your cardiac medications before your next appointment, please call your pharmacy*  Lab Work: NONE ORDERED  TODAY   If you have labs (blood work) drawn today and your tests are completely normal, you will receive your results only by: . MyChart Message (if you have MyChart) OR . A paper copy in the mail If you have any lab test that is abnormal or we need to change your treatment, we will call you to review the results.  Testing/Procedures: NONE ORDERED  TODAY   Follow-Up: At [facility], you and your health needs are our priority.  As part of our continuing mission to provide you with exceptional heart care, we have created designated Provider Care Teams.  These Care Teams include your primary Cardiologist (physician) and Advanced Practice Providers (APPs -  Physician Assistants and Nurse Practitioners) who all work together to provide you with the care you need, when you need it.  Your next appointment:   1 year(s)  The format for your next appointment:   In Person  Provider:   You may see Taylor or one of the following Advanced Practice Providers on your designated Care Team:    Amber Seiler, NP  Renee Ursuy, PA-C  Michael "Andy" Tillery, PA-C   Other Instructions   

## 2019-06-27 ENCOUNTER — Ambulatory Visit (HOSPITAL_COMMUNITY): Payer: Medicare Other

## 2019-06-28 ENCOUNTER — Ambulatory Visit: Payer: Medicare Other

## 2019-06-30 ENCOUNTER — Ambulatory Visit (HOSPITAL_COMMUNITY): Payer: Medicare Other

## 2019-07-02 ENCOUNTER — Ambulatory Visit (HOSPITAL_COMMUNITY): Payer: Medicare Other

## 2019-07-04 ENCOUNTER — Ambulatory Visit (HOSPITAL_COMMUNITY): Payer: Medicare Other

## 2019-07-07 ENCOUNTER — Ambulatory Visit (INDEPENDENT_AMBULATORY_CARE_PROVIDER_SITE_OTHER): Payer: Medicare Other | Admitting: *Deleted

## 2019-07-07 ENCOUNTER — Other Ambulatory Visit: Payer: Medicare Other | Admitting: *Deleted

## 2019-07-07 ENCOUNTER — Ambulatory Visit (HOSPITAL_COMMUNITY): Payer: Medicare Other

## 2019-07-07 ENCOUNTER — Other Ambulatory Visit: Payer: Self-pay

## 2019-07-07 DIAGNOSIS — E785 Hyperlipidemia, unspecified: Secondary | ICD-10-CM

## 2019-07-07 DIAGNOSIS — I480 Paroxysmal atrial fibrillation: Secondary | ICD-10-CM

## 2019-07-07 DIAGNOSIS — Z5181 Encounter for therapeutic drug level monitoring: Secondary | ICD-10-CM | POA: Diagnosis not present

## 2019-07-07 LAB — LIPID PANEL
Chol/HDL Ratio: 2.9 ratio (ref 0.0–5.0)
Cholesterol, Total: 129 mg/dL (ref 100–199)
HDL: 44 mg/dL (ref >39)
LDL Chol Calc (NIH): 56 mg/dL (ref 0–99)
Triglycerides: 173 mg/dL — ABNORMAL HIGH (ref 0–149)
VLDL Cholesterol Cal: 29 mg/dL (ref 5–40)

## 2019-07-07 LAB — HEPATIC FUNCTION PANEL
ALT: 20 IU/L (ref 0–44)
AST: 24 IU/L (ref 0–40)
Albumin: 4.5 g/dL (ref 3.7–4.7)
Alkaline Phosphatase: 77 IU/L (ref 39–117)
Bilirubin Total: 0.7 mg/dL (ref 0.0–1.2)
Bilirubin, Direct: 0.2 mg/dL (ref 0.00–0.40)
Total Protein: 7 g/dL (ref 6.0–8.5)

## 2019-07-07 LAB — POCT INR: INR: 2.5 (ref 2.0–3.0)

## 2019-07-07 NOTE — Progress Notes (Signed)
Contacted patient on 07/07/2019 to notify him of his lipid panel results. Lipid panel reflective of statin use and lifestyle changes considering patient trialed Vascepa for about 1 week in 03/2019 (discontinued due to experiencing heart palpations and back pain). Encouraged patient for efforts in eating a healthier diet (reflected by his TG). Plan for patient to continue atorvastatin 80 mg daily and implementing healthy lifestyle changes. Advised patient to follow up with lipid clinic with any other concerns related to his cholesterol medications.

## 2019-07-07 NOTE — Patient Instructions (Signed)
Description   Continue taking same dosage 1.5 tablets every day except 1 tablet on Tuesdays, Thursdays and Saturdays.  Recheck INR 4 weeks.  Coumadin clinic [phone].

## 2019-07-09 ENCOUNTER — Ambulatory Visit (HOSPITAL_COMMUNITY): Payer: Medicare Other

## 2019-07-10 ENCOUNTER — Telehealth (HOSPITAL_COMMUNITY): Payer: Self-pay

## 2019-07-10 ENCOUNTER — Ambulatory Visit: Payer: Medicare Other | Attending: Internal Medicine

## 2019-07-10 DIAGNOSIS — Z23 Encounter for immunization: Secondary | ICD-10-CM | POA: Insufficient documentation

## 2019-07-10 NOTE — Telephone Encounter (Signed)
Pt insurance is active and benefits verified through [facility] Medicare Co-pay 0, DED 0/0 met, out of pocket $3,900/$46.47 met, co-insurance 0%. no pre-authorization required. Passport, 07/10/2019@9 :58am, REF# [id number]  Will contact patient to see if he is interested in the Cardiac Rehab Program. If interested, patient will need to complete follow up appt. Once completed, patient will be contacted for scheduling upon review by the RN Navigator.

## 2019-07-10 NOTE — Progress Notes (Signed)
   Covid-19 Vaccination Clinic  Name:  Brandon Klein    MRN: [id number] DOB: [birth year]  07/10/2019  Brandon Klein was observed post Covid-19 immunization for 15 minutes without incidence. He was provided with Vaccine Information Sheet and instruction to access the V-Safe system.   Brandon Klein was instructed to call 911 with any severe reactions post vaccine: Marland Kitchen Difficulty breathing  . Swelling of your face and throat  . A fast heartbeat  . A bad rash all over your body  . Dizziness and weakness    Immunizations Administered    Name Date Dose VIS Date Route   Pfizer COVID-19 Vaccine 07/10/2019  9:41 AM 0.3 mL 05/09/2019 Intramuscular   Manufacturer: [employer], [employer]   Lot: [id number]   NDC: [address]-7

## 2019-07-11 ENCOUNTER — Ambulatory Visit (HOSPITAL_COMMUNITY): Payer: Medicare Other

## 2019-07-14 ENCOUNTER — Ambulatory Visit (HOSPITAL_COMMUNITY): Payer: Medicare Other

## 2019-07-15 ENCOUNTER — Telehealth (HOSPITAL_COMMUNITY): Payer: Self-pay | Admitting: Pharmacist

## 2019-07-15 NOTE — Telephone Encounter (Signed)
Called patient to see if he was interested in participating in the Cardiac Rehab Program. Patient stated yes. Patient will come in for orientation on 07/22/2019@1 :30pm and will attend the 1:15pm exercise class.  Mailed homework package.

## 2019-07-15 NOTE — Telephone Encounter (Signed)
Cardiac Rehab Medication Review by a Pharmacist  Does the patient  feel that his/her medications are working for him/her?  Yes. Pt believes meds are working are working because lipids are in range and can feel the diuresis effect of lasix. He is also happy that the medications have prevented heart attacks from occurring for him.  Has the patient been experiencing any side effects to the medications prescribed?  No side effects other than very bad aching from Vascepa. Pt is no longer taking Vascepa.  Does the patient measure his/her own blood pressure or blood glucose at home?  Patient does not check BP at home since he has very frequent MD visits and cardiac rehab appointments. He does perform glucose checks every morning. Most readings are 140s-150s. A1C is < 6.5. Pt has lost 50 lbs after heart surgery.  Does the patient have any problems obtaining medications due to transportation or finances?   No issues other than Vascepa, which he is no longer taking.  Understanding of regimen: excellent - Has been on stable regimen for 5 years. Understanding of indications: good - understands most medication uses Potential of compliance: excellent. Pt has pill boxes and very very rarely misses doses.   Mr. Akamine is in good spirits and doing well. He is looking forward to his cardiac rehab orientation 07/22/19.  Alvia Grove, PharmD PGY1 Acute Care Pharmacy Resident 07/15/2019 10:42 AM

## 2019-07-16 ENCOUNTER — Ambulatory Visit (HOSPITAL_COMMUNITY): Payer: Medicare Other

## 2019-07-18 ENCOUNTER — Encounter (HOSPITAL_COMMUNITY)
Admission: RE | Admit: 2019-07-18 | Discharge: 2019-07-18 | Disposition: A | Discharge status: Home / Self Care | Payer: Medicare Other | Source: Ambulatory Visit | Attending: Cardiovascular Disease | Admitting: Cardiovascular Disease

## 2019-07-18 ENCOUNTER — Ambulatory Visit (HOSPITAL_COMMUNITY): Payer: Medicare Other

## 2019-07-18 ENCOUNTER — Other Ambulatory Visit: Payer: Self-pay

## 2019-07-18 DIAGNOSIS — Z952 Presence of prosthetic heart valve: Secondary | ICD-10-CM | POA: Insufficient documentation

## 2019-07-18 NOTE — Progress Notes (Signed)
Cardiac Rehab Note:  Successful telephone encounter to Brandon Klein to confirm Cardiac Rehab orientation appointment for 07/22/19  at 1:30. Nursing assessment completed. Instructions for appointment provided. Patient screening for Covid-19 negative. Will discuss participation in the [facility] program at orientation.  Zebulun Deman E. Suzie Portela RN, BSN [place]. [facility]  Cardiac and Pulmonary Rehabilitation [phone] Dial Direct: [phone]

## 2019-07-21 ENCOUNTER — Ambulatory Visit (HOSPITAL_COMMUNITY): Payer: Medicare Other

## 2019-07-22 ENCOUNTER — Encounter (HOSPITAL_COMMUNITY)
Admission: RE | Admit: 2019-07-22 | Discharge: 2019-07-22 | Disposition: A | Discharge status: Home / Self Care | Payer: Medicare Other | Source: Ambulatory Visit | Attending: Cardiovascular Disease | Admitting: Cardiovascular Disease

## 2019-07-22 ENCOUNTER — Other Ambulatory Visit: Payer: Self-pay

## 2019-07-22 VITALS — BP 118/72 | HR 68 | Temp 97.5°F | Ht 67.0 in | Wt 247.1 lb

## 2019-07-22 DIAGNOSIS — Z952 Presence of prosthetic heart valve: Secondary | ICD-10-CM

## 2019-07-23 ENCOUNTER — Ambulatory Visit (HOSPITAL_COMMUNITY): Payer: Medicare Other

## 2019-07-23 NOTE — Progress Notes (Addendum)
Cardiac Individual Treatment Plan  Patient Details  Name: Brandon Klein MRN: [id number] Date of Birth: 02/18/1941 Referring Provider:     CARDIAC REHAB PHASE II ORIENTATION from 07/22/2019 in [facility] CARDIAC [facility]  Referring Provider  Verne Carrow, MD       Initial Encounter Date:    CARDIAC REHAB PHASE II ORIENTATION from 07/22/2019 in [facility] CARDIAC REHAB  Date  07/22/19       Visit Diagnosis: S/P TAVR (transcatheter aortic valve replacement)  Patient's Home Medications on Admission:  Current Outpatient Medications:  .  acetaminophen (TYLENOL) 500 MG tablet, Take 1,000 mg by mouth daily as needed. , Disp: , Rfl:  .  atorvastatin (LIPITOR) 80 MG tablet, TAKE 1 TABLET BY MOUTH  DAILY, Disp: 90 tablet, Rfl: 2 .  carvedilol (COREG) 25 MG tablet, TAKE 1 TABLET(25 MG) BY MOUTH TWICE DAILY WITH A MEAL, Disp: 180 tablet, Rfl: 2 .  clindamycin (CLEOCIN) 300 MG capsule, Take 2 pills (600 mg) 2 hours before your dental procedure, Disp: 12 capsule, Rfl: 0 .  clopidogrel (PLAVIX) 75 MG tablet, Take 1 tablet (75 mg total) by mouth daily with breakfast., Disp: 90 tablet, Rfl: 3 .  docusate sodium (COLACE) 100 MG capsule, Take 200 mg by mouth daily as needed for mild constipation. , Disp: , Rfl:  .  fexofenadine (ALLEGRA) 180 MG tablet, Take 180 mg by mouth daily. , Disp: , Rfl:  .  fluticasone (FLONASE) 50 MCG/ACT nasal spray, Place 2 sprays into the nose daily. , Disp: , Rfl:  .  furosemide (LASIX) 20 MG tablet, TAKE 1 TABLET BY MOUTH TWICE DAILY. WITH 80 MG TO TOTAL 100MG , Disp: 180 tablet, Rfl: 2 .  furosemide (LASIX) 80 MG tablet, TAKE 1 TABLET BY MOUTH TWICE DAILY. TAKE WITH 20 MG TABLET, Disp: 180 tablet, Rfl: 2 .  Glucosamine-Chondroit-Vit C-Mn (GLUCOSAMINE CHONDR 1500 COMPLX) CAPS, Take 1 capsule by mouth every morning. , Disp: , Rfl:  .  hydrALAZINE (APRESOLINE) 25 MG tablet, TAKE 1 TABLET(25 MG) BY MOUTH THREE TIMES DAILY, Disp: 270  tablet, Rfl: 2 .  isosorbide mononitrate (IMDUR) 30 MG 24 hr tablet, TAKE 1 TABLET BY MOUTH EVERY DAY, Disp: 90 tablet, Rfl: 2 .  L-Lysine 1000 MG TABS, Take 1,000 mg by mouth daily. , Disp: , Rfl:  .  meclizine (ANTIVERT) 25 MG tablet, Take 25 mg by mouth daily as needed for dizziness. , Disp: , Rfl:  .  Multiple Vitamin (MULTIVITAMIN) tablet, Take 1 tablet by mouth daily. Centrum silver, Disp: , Rfl:  .  nitroGLYCERIN (NITROSTAT) 0.4 MG SL tablet, Place 1 tablet (0.4 mg total) under the tongue every 5 (five) minutes as needed for chest pain., Disp: 30 tablet, Rfl: 3 .  ONE TOUCH ULTRA TEST test strip, 1 each by Other route every morning. , Disp: , Rfl:  .  ONETOUCH DELICA LANCETS FINE MISC, 1 each by Other route every morning. , Disp: , Rfl:  .  pantoprazole (PROTONIX) 40 MG tablet, Take 40 mg by mouth daily. , Disp: , Rfl:  .  potassium chloride SA (K-DUR) 20 MEQ tablet, TAKE 2 TABLETS BY MOUTH TWICE DAILY, Disp: 360 tablet, Rfl: 2 .  RA NATURAL MAGNESIUM 250 MG TABS, TAKE 2 TABLETS TWICE DAILY., Disp: 120 tablet, Rfl: 5 .  spironolactone (ALDACTONE) 25 MG tablet, TAKE 1 TABLET(25 MG) BY MOUTH DAILY, Disp: 90 tablet, Rfl: 2 .  warfarin (COUMADIN) 5 MG tablet, Take 1 to 1.5 tablets daily  as directed by Coumadin clinic (Patient taking differently: Take 5-7.5 mg by mouth at bedtime. Take 5 mg on Tuesday and Thursday Take 7.5 mg all the other days), Disp: 135 tablet, Rfl: 1  Past Medical History: Past Medical History:  Diagnosis Date  . Chronic renal insufficiency   . Coronary artery disease    a. s/p PCI of LAD 1997. b. Cutting balloon angioplasty to LCx in 2002. c. s/p cath 11/2013 with severe 3 vessel ASCAD s/p CABG with LIMA to LAD, SVG to diag, SVG to left circ and SVG to PDA. d. cath 07/07/2014 occluded SVG to PDA, all other grafts patent. 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 DJD (degenerative joint disease)   . Epistaxis   . GERD (gastroesophageal reflux disease)   . GI bleeding    a.  with benign  gastric polypectomy 05/2014.  Marland Kitchen HLD (hyperlipidemia)   . HTN (hypertension)   . Obesity   . OSA (obstructive sleep apnea)   . Pacemaker    a. 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 b. MDT generator replacement 2012.  Marland Kitchen PAF (paroxysmal atrial fibrillation) (HCC)    on chronic systemic anticoagulation  . PVC's (premature ventricular contractions)   . 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 Severe aortic stenosis   . Syncope    a. vasovagal with documented bradycardia.  . Type 2 diabetes mellitus without complications (HCC) 12/02/2015    Tobacco Use: Social History   Tobacco Use  Smoking Status Former Smoker  . Packs/day: 2.00  . Years: 5.00  . Pack years: 10.00  . Types: Cigarettes  . Quit date: 05/29/1966  . Years since quitting: 53.1  Smokeless Tobacco Never Used    Labs: Recent Review Flowsheet Data     Labs for ITP Cardiac and Pulmonary Rehab Latest Ref Rng & Units 04/08/2019 04/08/2019 04/08/2019 04/08/2019 07/07/2019   Cholestrol 100 - 199 mg/dL - - - - 409   LDLCALC 0 - 99 mg/dL - - - - 56   LDLDIRECT mg/dL - - - - -   HDL >81 mg/dL - - - - 44   Trlycerides 0 - 149 mg/dL - - - - 191(Y)   Hemoglobin A1c 4.8 - 5.6 % - - - - -   PHART 7.350 - 7.450 - - - - -   PCO2ART 32.0 - 48.0 mmHg - - - - -   HCO3 20.0 - 28.0 mmol/L - - - - -   TCO2 22 - 32 mmol/L -   ACIDBASEDEF 0.0 - 2.0 mmol/L - - - - -   O2SAT % - - - - -       Capillary Blood Glucose: Lab Results  Component Value Date   GLUCAP 147 (H) 04/08/2019   GLUCAP 133 (H) 04/04/2019   GLUCAP 142 (H) 03/21/2019   GLUCAP 138 (H) 12/05/2013   GLUCAP 133 (H) 12/05/2013      Exercise Target Goals: Exercise Program Goal: Individual exercise prescription set using results from initial 6 min walk test and THRR while  considering  patient's activity barriers and safety.   Exercise Prescription Goal: Initial exercise prescription builds to 30-45 minutes a day of aerobic activity, 2-3 days per week.  Home exercise guidelines  will be given to patient during program as part of exercise prescription that the participant will acknowledge.  Activity Barriers & Risk Stratification: Activity Barriers & Cardiac Risk Stratification - 07/22/19 1401       Activity Barriers & Cardiac Risk Stratification   Activity Barriers  Arthritis;Assistive Device;Balance Concerns;Other (comment)    Comments  Left knee meniscus surgery, right knee arthritis, vertigo.    Cardiac Risk Stratification  High        6 Minute Walk: 6 Minute Walk     Row Name 07/22/19 1423         6 Minute Walk   Phase  Initial     Distance  788 feet     Walk Time  6 minutes     # of Rest Breaks  0     MPH  1.49     METS  0.83     RPE  13     Perceived Dyspnea   0     VO2 Peak  2.92     Symptoms  No     Resting HR  68 bpm     Resting BP  118/72     Resting Oxygen Saturation   95 %     Exercise Oxygen Saturation  during 6 min walk  96 %     Max Ex. HR  96 bpm     Max Ex. BP  124/76     2 Minute Post BP  118/78         Oxygen Initial Assessment:    Oxygen Re-Evaluation:    Oxygen Discharge (Final Oxygen Re-Evaluation):    Initial Exercise Prescription: Initial Exercise Prescription - 07/22/19 1400       Date of Initial Exercise RX and Referring Provider   Date  07/22/19    Referring Provider  Verne Carrow, MD    Expected Discharge Date  09/19/19      Recumbant Bike   Level  1    Minutes  15    METs  1.8      NuStep   Level  2    SPM  85    Minutes  15    METs  1.8      Prescription Details   Frequency (times per week)  3    Duration  Progress to 30 minutes of continuous aerobic without signs/symptoms of physical distress      Intensity   THRR 40-80% of Max Heartrate  57-114    Ratings of  Perceived Exertion  11-13    Perceived Dyspnea  0-4      Progression   Progression  Continue to progress workloads to maintain intensity without signs/symptoms of physical distress.      Resistance Training   Training Prescription  Yes    Weight  3lbs.    Reps  10-15        Perform Capillary Blood Glucose checks as needed.  Exercise Prescription Changes:    Exercise Comments:    Exercise Goals and Review: Exercise Goals     Row Name 07/22/19 1403             Exercise Goals   Increase Physical Activity  Yes       Intervention  Provide advice, education, support and counseling about physical activity/exercise needs.;Develop an individualized exercise prescription for aerobic and resistive training based on initial evaluation findings, risk stratification, comorbidities and participant's personal goals.       Expected Outcomes  Short Term: Attend rehab on a regular basis to increase amount of physical activity.;Long Term: Exercising regularly at least 3-5 days a week.;Long Term: Add in home exercise to make exercise part of routine and to increase amount of physical activity.       Increase Strength and Stamina  Yes       Intervention  Provide advice, education, support and counseling about physical activity/exercise needs.;Develop an individualized exercise prescription for aerobic and resistive training based on initial evaluation findings, risk stratification, comorbidities and participant's personal goals.       Expected Outcomes  Short Term: Increase workloads from initial exercise prescription for resistance, speed, and METs.;Short Term: Perform resistance training exercises routinely during rehab and add in resistance training at home;Long Term: Improve cardiorespiratory fitness, muscular endurance and strength as measured by increased METs and functional capacity ( )       Able to understand and use rate of perceived exertion (RPE) scale  Yes       Intervention  Provide  education and explanation on how to use RPE scale       Expected Outcomes  Short Term: Able to use RPE daily in rehab to express subjective intensity level;Long Term:  Able to use RPE to guide intensity level when exercising independently       Knowledge and understanding of Target Heart Rate Range (THRR)  Yes       Intervention  Provide education and explanation of THRR including how the numbers were predicted and where they are located for reference       Expected Outcomes  Short Term: Able to state/look up THRR;Long Term: Able to use THRR to govern intensity when exercising independently;Short Term: Able to use daily as guideline for intensity in rehab       Able to check pulse independently  Yes       Intervention  Provide education and demonstration on how to check pulse in carotid and radial arteries.;Review the importance of being able to check your own pulse for safety during independent exercise       Expected Outcomes  Short Term: Able to explain why pulse checking is important during independent exercise;Long Term: Able to check pulse independently and accurately       Understanding of Exercise Prescription  Yes       Intervention  Provide education, explanation, and written materials on patient's individual exercise prescription       Expected Outcomes  Short Term: Able to explain program exercise prescription;Long Term: Able to explain home exercise prescription to exercise independently           Exercise Goals Re-Evaluation :    Discharge Exercise Prescription (Final Exercise Prescription Changes):    Nutrition:  Target Goals: Understanding of nutrition guidelines, daily intake of sodium 1500mg , cholesterol 200mg , calories 30% from fat and 7% or less from saturated fats, daily to have 5 or more servings of fruits and vegetables.  Biometrics: Pre Biometrics - 07/22/19 1355       Pre Biometrics   Height   (1.702 m)    Weight  112.1 kg    Waist Circumference  46.5  inches    Hip Circumference  47 inches    Waist to Hip Ratio  0.99 %    BMI (Calculated)  38.7    Triceps Skinfold  25 mm    % Body Fat  37.4 %    Grip Strength  32.5 kg    Flexibility  13.75 in  Single Leg Stand  1.93 seconds          Nutrition Therapy Plan and Nutrition Goals:    Nutrition Assessments:    Nutrition Goals Re-Evaluation:    Nutrition Goals Re-Evaluation:    Nutrition Goals Discharge (Final Nutrition Goals Re-Evaluation):    Psychosocial: Target Goals: Acknowledge presence or absence of significant depression and/or stress, maximize coping skills, provide positive support system. Participant is able to verbalize types and ability to use techniques and skills needed for reducing stress and depression.  Initial Review & Psychosocial Screening: Initial Psych Review & Screening - 07/22/19 1411       Initial Review   Current issues with  None Identified      Family Dynamics   Good Support System?  Yes   Rosanne Ashing states his wife is a soure of support.   Comments  Rosanne Ashing is eager to return to cardiac rehab.      Barriers   Psychosocial barriers to participate in program  There are no identifiable barriers or psychosocial needs.        Quality of Life Scores: Quality of Life - 07/22/19 1453       Quality of Life   Select  Quality of Life      Quality of Life Scores   Health/Function Pre  23.73 %    Socioeconomic Pre  26 %    Psych/Spiritual Pre  22 %    Family Pre  23.2 %    GLOBAL Pre  23.7 %       Scores of 19 and below usually indicate a poorer quality of life in these areas.  A difference of  2-3 points is a clinically meaningful difference.  A difference of 2-3 points in the total score of the Quality of Life Index has been associated with significant improvement in overall quality of life, self-image, physical symptoms, and general health in studies assessing change in quality of life.  PHQ-9: Recent Review Flowsheet Data     Depression  screen [facility] 2/9 07/22/2019   Decreased Interest 0   Down, Depressed, Hopeless 0   PHQ - 2 Score 0      Interpretation of Total Score  Total Score Depression Severity:  1-4 = Minimal depression, 5-9 = Mild depression, 10-14 = Moderate depression, 15-19 = Moderately severe depression, 20-27 = Severe depression   Psychosocial Evaluation and Intervention:    Psychosocial Re-Evaluation:    Psychosocial Discharge (Final Psychosocial Re-Evaluation):    Vocational Rehabilitation: Provide vocational rehab assistance to qualifying candidates.   Vocational Rehab Evaluation & Intervention: Vocational Rehab - 07/22/19 1412       Initial Vocational Rehab Evaluation & Intervention   Assessment shows need for Vocational Rehabilitation  No        Education: Education Goals: Education classes will be provided on a weekly basis, covering required topics. Participant will state understanding/return demonstration of topics presented.  Learning Barriers/Preferences: Learning Barriers/Preferences - 07/23/19 1344       Learning Barriers/Preferences   Learning Barriers  Sight    Learning Preferences  Written Material;Skilled Demonstration        Education Topics: Count Your Pulse:  -Group instruction provided by verbal instruction, demonstration, patient participation and written materials to support subject.  Instructors address importance of being able to find your pulse and how to count your pulse when at home without a heart monitor.  Patients get hands on experience counting their pulse with staff help and individually.    Heart  Attack, Angina, and Risk Factor Modification:  -Group instruction provided by verbal instruction, video, and written materials to support subject.  Instructors address signs and symptoms of angina and heart attacks.    Also discuss risk factors for heart disease and how to make changes to improve heart health risk factors.    Functional Fitness:  -Group  instruction provided by verbal instruction, demonstration, patient participation, and written materials to support subject.  Instructors address safety measures for doing things around the house.  Discuss how to get up and down off the floor, how to pick things up properly, how to safely get out of a chair without assistance, and balance training.    Meditation and Mindfulness:  -Group instruction provided by verbal instruction, patient participation, and written materials to support subject.  Instructor addresses importance of mindfulness and meditation practice to help reduce stress and improve awareness.  Instructor also leads participants through a meditation exercise.     Stretching for Flexibility and Mobility:  -Group instruction provided by verbal instruction, patient participation, and written materials to support subject.  Instructors lead participants through series of stretches that are designed to increase flexibility thus improving mobility.  These stretches are additional exercise for major muscle groups that are typically performed during regular warm up and cool down.    Hands Only CPR:  -Group verbal, video, and participation provides a basic overview of AHA guidelines for community CPR. Role-play of emergencies allow participants the opportunity to practice calling for help and chest compression technique with discussion of AED use.    Hypertension: -Group verbal and written instruction that provides a basic overview of hypertension including the most recent diagnostic guidelines, risk factor reduction with self-care instructions and medication management.     Nutrition I class: Heart Healthy Eating:  -Group instruction provided by PowerPoint slides, verbal discussion, and written materials to support subject matter. The instructor gives an explanation and review of the Therapeutic Lifestyle Changes diet recommendations, which includes a discussion on lipid goals, dietary fat,  sodium, fiber, plant stanol/sterol esters, sugar, and the components of a well-balanced, healthy diet.    Nutrition II class: Lifestyle Skills:  -Group instruction provided by PowerPoint slides, verbal discussion, and written materials to support subject matter. The instructor gives an explanation and review of label reading, grocery shopping for heart health, heart healthy recipe modifications, and ways to make healthier choices when eating out.    Diabetes Question & Answer:  -Group instruction provided by PowerPoint slides, verbal discussion, and written materials to support subject matter. The instructor gives an explanation and review of diabetes co-morbidities, pre- and post-prandial blood glucose goals, pre-exercise blood glucose goals, signs, symptoms, and treatment of hypoglycemia and hyperglycemia, and foot care basics.    Diabetes Blitz:  -Group instruction provided by PowerPoint slides, verbal discussion, and written materials to support subject matter. The instructor gives an explanation and review of the physiology behind type 1 and type 2 diabetes, diabetes medications and rational behind using different medications, pre- and post-prandial blood glucose recommendations and Hemoglobin A1c goals, diabetes diet, and exercise including blood glucose guidelines for exercising safely.     Portion Distortion:  -Group instruction provided by PowerPoint slides, verbal discussion, written materials, and food models to support subject matter. The instructor gives an explanation of serving size versus portion size, changes in portions sizes over the last 20 years, and what consists of a serving from each food group.    Stress Management:  -Group instruction provided by verbal  instruction, video, and written materials to support subject matter.  Instructors review role of stress in heart disease and how to cope with stress positively.      Exercising on Your Own:  -Group instruction  provided by verbal instruction, power point, and written materials to support subject.  Instructors discuss benefits of exercise, components of exercise, frequency and intensity of exercise, and end points for exercise.  Also discuss use of nitroglycerin and activating EMS.  Review options of places to exercise outside of rehab.  Review guidelines for sex with heart disease.    Cardiac Drugs I:  -Group instruction provided by verbal instruction and written materials to support subject.  Instructor reviews cardiac drug classes: antiplatelets, anticoagulants, beta blockers, and statins.  Instructor discusses reasons, side effects, and lifestyle considerations for each drug class.    Cardiac Drugs II:  -Group instruction provided by verbal instruction and written materials to support subject.  Instructor reviews cardiac drug classes: angiotensin converting enzyme inhibitors (ACE-I), angiotensin II receptor blockers (ARBs), nitrates, and calcium channel blockers.  Instructor discusses reasons, side effects, and lifestyle considerations for each drug class.    Anatomy and Physiology of the Circulatory System:  Group verbal and written instruction and models provide basic cardiac anatomy and physiology, with the coronary electrical and arterial systems. Review of: AMI, Angina, Valve disease, Heart Failure, Peripheral Artery Disease, Cardiac Arrhythmia, Pacemakers, and the ICD.    Other Education:  -Group or individual verbal, written, or video instructions that support the educational goals of the cardiac rehab program.    Holiday Eating Survival Tips:  -Group instruction provided by PowerPoint slides, verbal discussion, and written materials to support subject matter. The instructor gives patients tips, tricks, and techniques to help them not only survive but enjoy the holidays despite the onslaught of food that accompanies the holidays.    Knowledge Questionnaire Score: Knowledge Questionnaire  Score - 07/22/19 1443       Knowledge Questionnaire Score   Pre Score  21/24        Core Components/Risk Factors/Patient Goals at Admission: Personal Goals and Risk Factors at Admission - 07/22/19 1405       Core Components/Risk Factors/Patient Goals on Admission    Weight Management  Yes;Obesity    Intervention  Weight Management/Obesity: Establish reasonable short term and long term weight goals.;Obesity: Provide education and appropriate resources to help participant work on and attain dietary goals.    Admit Weight  247 lb 2.2 oz (112.1 kg)    Expected Outcomes  Short Term: Continue to assess and modify interventions until short term weight is achieved;Long Term: Adherence to nutrition and physical activity/exercise program aimed toward attainment of established weight goal;Weight Loss: Understanding of general recommendations for a balanced deficit meal plan, which promotes 1-2 lb weight loss per week and includes a negative energy balance of [phone] kcal/d;Understanding of distribution of calorie intake throughout the day with the consumption of 4-5 meals/snacks    Diabetes  Yes    Intervention  Provide education about signs/symptoms and action to take for hypo/hyperglycemia.;Provide education about proper nutrition, including hydration, and aerobic/resistive exercise prescription along with prescribed medications to achieve blood glucose in normal ranges: Fasting glucose 65-99 mg/dL    Expected Outcomes  Short Term: Participant verbalizes understanding of the signs/symptoms and immediate care of hyper/hypoglycemia, proper foot care and importance of medication, aerobic/resistive exercise and nutrition plan for blood glucose control.;Long Term: Attainment of HbA1C < 7%.    Hypertension  Yes  Intervention  Provide education on lifestyle modifcations including regular physical activity/exercise, weight management, moderate sodium restriction and increased consumption of fresh fruit,  vegetables, and low fat dairy, alcohol moderation, and smoking cessation.;Monitor prescription use compliance.    Expected Outcomes  Short Term: Continued assessment and intervention until BP is < 140/30mm HG in hypertensive participants. < 130/1mm HG in hypertensive participants with diabetes, heart failure or chronic kidney disease.;Long Term: Maintenance of blood pressure at goal levels.    Lipids  Yes    Intervention  Provide education and support for participant on nutrition & aerobic/resistive exercise along with prescribed medications to achieve LDL 70mg , HDL >40mg .    Expected Outcomes  Short Term: Participant states understanding of desired cholesterol values and is compliant with medications prescribed. Participant is following exercise prescription and nutrition guidelines.;Long Term: Cholesterol controlled with medications as prescribed, with individualized exercise RX and with personalized nutrition plan. Value goals: LDL < 70mg , HDL > 40 mg.        Core Components/Risk Factors/Patient Goals Review:     Core Components/Risk Factors/Patient Goals at Discharge (Final Review):     ITP Comments: ITP Comments     Row Name 07/22/19 1411           ITP Comments  Dr. Armanda Magic, Medical Director           Comments: Patient attended orientation on 07/23/2019 to review rules and guidelines for program.  Completed 6 minute walk test, Intitial ITP, and exercise prescription.  VSS. Telemetry-AV Paced.  Asymptomatic. Safety measures and social distancing in place per CDC guidelines.

## 2019-07-25 ENCOUNTER — Ambulatory Visit (HOSPITAL_COMMUNITY): Payer: Medicare Other

## 2019-07-28 ENCOUNTER — Encounter: Payer: Self-pay | Admitting: Cardiology

## 2019-07-28 ENCOUNTER — Ambulatory Visit: Payer: Medicare Other | Admitting: Cardiology

## 2019-07-28 ENCOUNTER — Other Ambulatory Visit: Payer: Self-pay

## 2019-07-28 VITALS — BP 112/70 | HR 71 | Ht 67.0 in | Wt 246.0 lb

## 2019-07-28 DIAGNOSIS — I350 Nonrheumatic aortic (valve) stenosis: Secondary | ICD-10-CM

## 2019-07-28 DIAGNOSIS — I10 Essential (primary) hypertension: Secondary | ICD-10-CM

## 2019-07-28 DIAGNOSIS — I5042 Chronic combined systolic (congestive) and diastolic (congestive) heart failure: Secondary | ICD-10-CM | POA: Diagnosis not present

## 2019-07-28 DIAGNOSIS — I480 Paroxysmal atrial fibrillation: Secondary | ICD-10-CM | POA: Diagnosis not present

## 2019-07-28 DIAGNOSIS — I2510 Atherosclerotic heart disease of native coronary artery without angina pectoris: Secondary | ICD-10-CM

## 2019-07-28 DIAGNOSIS — E785 Hyperlipidemia, unspecified: Secondary | ICD-10-CM

## 2019-07-28 NOTE — Addendum Note (Signed)
Addended by: Theresia Majors on: 07/28/2019 03:08 PM   Modules accepted: Orders

## 2019-07-28 NOTE — Progress Notes (Signed)
Cardiology Office Note:    Date:  07/28/2019   ID:  Brandon Klein, DOB [birth year], MRN [id number]  PCP:  Juluis Rainier, MD  Cardiologist:  Armanda Magic, MD    Referring MD: Juluis Rainier, MD   Chief Complaint  Patient presents with  . Coronary Artery Disease  . Hypertension  . Atrial Fibrillation  . Hyperlipidemia  . Aortic Stenosis  . Congestive Heart Failure    History of Present Illness:    Brandon Klein is a 79 y.o. 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  He has a hx of CAD and underwent 4V CABG in 2015. He has had a pacemaker implanted remotely for bradycardia and syncope. He has paroxysmal atrial fibrillation and is on coumadin. His aortic stenosis has been followed for several years.Echo 01/31/19 showed LVEF=45-50%with severe AS. TAVR workup delayed while he completed his planned cataract surgery. Cardiac cath 03/21/19 with 3/4 patent bypass grafts. The SVG to the PDA is known to be occluded. A drug eluting stent was placed in the distal RCA.   He underwent successful TAVR with a61mm Edwards Sapien 3 THV via the TF approach on 04/08/19. Post operative echo showed EF 50-55% with normally functioning TAVR with a mean gradient of 12 mm Hg and no PVL. He has done well in follow up.  He is here today for followup and is doing well.  He denies any chest pain or pressure, SOB, DOE, PND, orthopnea, LE edema, dizziness, palpitations or syncope. He is compliant with his meds and is tolerating meds with no SE.    Past Medical History:  Diagnosis Date  . Chronic renal insufficiency   . Coronary artery disease    a. s/p PCI of LAD 1997. b. Cutting balloon angioplasty to LCx in 2002. c. s/p cath 11/2013 with severe 3 vessel ASCAD s/p CABG with LIMA to LAD, SVG to  diag, SVG to left circ and SVG to PDA. d. cath 07/07/2014 occluded SVG to PDA, all other grafts patent. 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 DJD (degenerative joint disease)   . Epistaxis   . GERD (gastroesophageal reflux disease)   . GI bleeding    a.  with benign gastric polypectomy 05/2014.  Marland Kitchen HLD (hyperlipidemia)   . HTN (hypertension)   . Obesity   . OSA (obstructive sleep apnea)   . Pacemaker    a. 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 b. MDT generator replacement 2012.  Marland Kitchen PAF (paroxysmal atrial fibrillation) (HCC)    on chronic systemic anticoagulation  . PVC's (premature ventricular contractions)   . 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 Severe aortic stenosis   . Syncope    a. vasovagal with documented bradycardia.  . Type 2 diabetes mellitus without complications (HCC) 12/02/2015    Past Surgical History:  Procedure Laterality Date  . ANGIOPLASTY  1997  . CHOLECYSTECTOMY    . COLONOSCOPY WITH PROPOFOL N/A 05/05/2014   Procedure: COLONOSCOPY WITH PROPOFOL;  Surgeon: Charolett Bumpers, MD;  Location: [facility] ENDOSCOPY;  Service: Endoscopy;  Laterality: N/A;  .  CORONARY ARTERY BYPASS GRAFT N/A 12/01/2013   Procedure: CORONARY ARTERY BYPASS GRAFT TIMES FOUR USING LEFT INTERNAL MAMMARY ARTERY TO LAD, SAPHENOUS VEIN GRAFTS TO DIAGONAL, CIRCUMFELX AND POSTERIOR DESCENDING;  Surgeon: Kerin Perna, MD;  Location: [facility] OR;  Service: Open Heart Surgery;  Laterality: N/A;  . CORONARY STENT INTERVENTION N/A 03/21/2019   Procedure: CORONARY STENT INTERVENTION;  Surgeon: Kathleene Hazel, MD;  Location: [facility] INVASIVE CV LAB;  Service: Cardiovascular;  Laterality: N/A;  Distal RCA  . ESOPHAGOGASTRODUODENOSCOPY (EGD) WITH PROPOFOL N/A 05/05/2014   Procedure: ESOPHAGOGASTRODUODENOSCOPY (EGD) WITH PROPOFOL;  Surgeon:  Charolett Bumpers, MD;  Location: [facility] ENDOSCOPY;  Service: Endoscopy;  Laterality: N/A;  . KNEE ARTHROSCOPY  2000   Left  . LEFT AND RIGHT HEART CATHETERIZATION WITH CORONARY/GRAFT ANGIOGRAM N/A 07/07/2014   Procedure: LEFT AND RIGHT HEART CATHETERIZATION WITH Isabel Caprice;  Surgeon: Kathleene Hazel, MD;  Location: [facility] CATH LAB;  Service: Cardiovascular;  Laterality: N/A;  . LEFT HEART CATHETERIZATION WITH CORONARY ANGIOGRAM N/A 11/25/2013   Procedure: LEFT HEART CATHETERIZATION WITH CORONARY ANGIOGRAM;  Surgeon: [name], MD;  Location: [facility] CATH LAB;  Service: Cardiovascular;  Laterality: N/A;  . PACEMAKER INSERTION  2002   generator change MDT by Dr Graciela Husbands in 2012  . RIGHT/LEFT HEART CATH AND CORONARY/GRAFT ANGIOGRAPHY N/A 03/21/2019   Procedure: RIGHT/LEFT HEART CATH AND CORONARY/GRAFT ANGIOGRAPHY;  Surgeon: Kathleene Hazel, MD;  Location: [facility] INVASIVE CV LAB;  Service: Cardiovascular;  Laterality: N/A;  . TEE WITHOUT CARDIOVERSION N/A 08/31/2014   Procedure: TRANSESOPHAGEAL ECHOCARDIOGRAM (TEE);  Surgeon: [name], MD;  Location: [facility] ENDOSCOPY;  Service: Cardiovascular;  Laterality: N/A;  . TEE WITHOUT CARDIOVERSION N/A 04/08/2019   Procedure: TRANSESOPHAGEAL ECHOCARDIOGRAM (TEE);  Surgeon: Kathleene Hazel, MD;  Location: [facility] OR;  Service: Open Heart Surgery;  Laterality: N/A;  . TRANSCATHETER AORTIC VALVE REPLACEMENT, TRANSFEMORAL  04/08/2019  . TRANSCATHETER AORTIC VALVE REPLACEMENT, TRANSFEMORAL N/A 04/08/2019   Procedure: TRANSCATHETER AORTIC VALVE REPLACEMENT, TRANSFEMORAL;  Surgeon: Kathleene Hazel, MD;  Location: [facility] OR;  Service: Open Heart Surgery;  Laterality: N/A;    Current Medications: Current Meds  Medication Sig  . acetaminophen (TYLENOL) 500 MG tablet Take 1,000 mg by mouth daily as needed.   Marland Kitchen atorvastatin (LIPITOR) 80 MG tablet TAKE 1 TABLET BY MOUTH  DAILY  . carvedilol (COREG) 25 MG tablet TAKE 1 TABLET(25 MG) BY MOUTH TWICE  DAILY WITH A MEAL  . clindamycin (CLEOCIN) 300 MG capsule Take 2 pills (600 mg) 2 hours before your dental procedure  . clopidogrel (PLAVIX) 75 MG tablet Take 1 tablet (75 mg total) by mouth daily with breakfast.  . docusate sodium (COLACE) 100 MG capsule Take 200 mg by mouth daily as needed for mild constipation.   . fexofenadine (ALLEGRA) 180 MG tablet Take 180 mg by mouth daily.   . fluticasone (FLONASE) 50 MCG/ACT nasal spray Place 2 sprays into the nose daily.   . furosemide (LASIX) 20 MG tablet TAKE 1 TABLET BY MOUTH TWICE DAILY. WITH 80 MG TO TOTAL 100MG   . furosemide (LASIX) 80 MG tablet TAKE 1 TABLET BY MOUTH TWICE DAILY. TAKE WITH 20 MG TABLET  . Glucosamine-Chondroit-Vit C-Mn (GLUCOSAMINE CHONDR 1500 COMPLX) CAPS Take 1 capsule by mouth every morning.   . hydrALAZINE (APRESOLINE) 25 MG tablet TAKE 1 TABLET(25 MG) BY MOUTH THREE TIMES DAILY  . isosorbide mononitrate (IMDUR) 30 MG 24 hr tablet TAKE 1 TABLET BY MOUTH EVERY DAY  . L-Lysine 1000 MG TABS Take 1,000 mg  by mouth daily.   . meclizine (ANTIVERT) 25 MG tablet Take 25 mg by mouth daily as needed for dizziness.   . Multiple Vitamin (MULTIVITAMIN) tablet Take 1 tablet by mouth daily. Centrum silver  . nitroGLYCERIN (NITROSTAT) 0.4 MG SL tablet Place 1 tablet (0.4 mg total) under the tongue every 5 (five) minutes as needed for chest pain.  . ONE TOUCH ULTRA TEST test strip 1 each by Other route every morning.   Glory Rosebush DELICA LANCETS FINE MISC 1 each by Other route every morning.   . pantoprazole (PROTONIX) 40 MG tablet Take 40 mg by mouth daily.   . potassium chloride SA (K-DUR) 20 MEQ tablet TAKE 2 TABLETS BY MOUTH TWICE DAILY  . RA NATURAL MAGNESIUM 250 MG TABS TAKE 2 TABLETS TWICE DAILY.  Marland Kitchen spironolactone (ALDACTONE) 25 MG tablet TAKE 1 TABLET(25 MG) BY MOUTH DAILY  . warfarin (COUMADIN) 5 MG tablet Take 1 to 1.5 tablets daily as directed by Coumadin clinic     Allergies:   Acetaminophen-codeine, Vascepa [icosapent ethyl],  Amoxicillin, and Diovan [valsartan]   Social History   Socioeconomic History  . Marital status: Married    Spouse name: Not on file  . Number of children: Not on file  . Years of education: Not on file  . Highest education level: Not on file  Occupational History  . Not on file  Tobacco Use  . Smoking status: Former Smoker    Packs/day: 2.00    Years: 5.00    Pack years: 10.00    Types: Cigarettes    Quit date: 05/29/1966    Years since quitting: 53.2  . Smokeless tobacco: Never Used  Substance and Sexual Activity  . Alcohol use: Yes    Alcohol/week: 0.0 standard drinks    Comment: ocassional  . Drug use: No  . Sexual activity: Not on file  Other Topics Concern  . Not on file  Social History Narrative   Lives in [place] with spouse.  Retired Hotel manager   Social Determinants of Radio broadcast assistant Strain:   . Difficulty of Paying Living Expenses: Not on file  Food Insecurity:   . Worried About Charity fundraiser in the Last Year: Not on file  . Ran Out of Food in the Last Year: Not on file  Transportation Needs:   . Lack of Transportation (Medical): Not on file  . Lack of Transportation (Non-Medical): Not on file  Physical Activity:   . Days of Exercise per Week: Not on file  . Minutes of Exercise per Session: Not on file  Stress:   . Feeling of Stress : Not on file  Social Connections:   . Frequency of Communication with Friends and Family: Not on file  . Frequency of Social Gatherings with Friends and Family: Not on file  . Attends Religious Services: Not on file  . Active Member of Clubs or Organizations: Not on file  . Attends Archivist Meetings: Not on file  . Marital Status: Not on file     Family History: The patient's family history includes Colon cancer in his mother; Heart attack in his brother and mother; Leukemia in his father.  ROS:   Please see the history of present illness.    ROS  All other systems reviewed and negative.    EKGs/Labs/Other Studies Reviewed:    The following studies were reviewed today: 2D echo  EKG:  EKG is not ordered today.    Recent Labs: 04/04/2019:  B Natriuretic Peptide 70.7 04/09/2019: BUN 22; Creatinine, Ser 1.26; Hemoglobin 14.4; Magnesium 2.1; Platelets 114; Potassium 4.1; Sodium 136 07/07/2019: ALT 20   Recent Lipid Panel    Component Value Date/Time   CHOL 129 07/07/2019 1053   CHOL 113 07/22/2014 1226   TRIG 173 (H) 07/07/2019 1053   TRIG 91 07/22/2014 1226   HDL 44 07/07/2019 1053   HDL 40 07/22/2014 1226   CHOLHDL 2.9 07/07/2019 1053   CHOLHDL 3.4 06/04/2015 1216   VLDL 39 (H) 06/04/2015 1216   LDLCALC 56 07/07/2019 1053   LDLCALC 55 07/22/2014 1226   LDLDIRECT 47.9 07/14/2013 0937    Physical Exam:    VS:  BP 112/70   Pulse 71   Ht 5\' 7"  (1.702 m)   Wt 246 lb (111.6 kg)   SpO2 98%   BMI 38.53 kg/m     Wt Readings from Last 3 Encounters:  07/28/19 246 lb (111.6 kg)  07/22/19 247 lb 2.2 oz (112.1 kg)  06/25/19 246 lb (111.6 kg)     GEN:  Well nourished, well developed in no acute distress HEENT: Normal NECK: No JVD; No carotid bruits LYMPHATICS: No lymphadenopathy CARDIAC: RRR, no murmurs, rubs, gallops RESPIRATORY:  Clear to auscultation without rales, wheezing or rhonchi  ABDOMEN: Soft, non-tender, non-distended MUSCULOSKELETAL:  No edema; No deformity  SKIN: Warm and dry NEUROLOGIC:  Alert and oriented x 3 PSYCHIATRIC:  Normal affect   ASSESSMENT:    1. Severe aortic stenosis   2. Coronary artery disease involving native coronary artery of native heart without angina pectoris   3. PAF (paroxysmal atrial fibrillation) (HCC)   4. Chronic combined systolic and diastolic CHF (congestive heart failure) (HCC)   5. Essential hypertension   6. Hyperlipidemia, unspecified hyperlipidemia type    PLAN:    In order of problems listed above:  1.  Severe AS -40mm Edwards Sapien 3 THV via the TF approach on 04/08/19.  -followed in Structural  heart clinic -2D echo 04/2019 showed AVA 1.91cm2, AV mean gradient 05/2019 and dimensionless index 0.37. -reiterated need for SBE prophylaxis for dental procedures -continue Plavix 75mg  daily - will stop Plavix in April and restart ASA 81mg  daily -repeat echo in 1 year  2.  ASCAD -s/p 4V CABG 2015  -Cardiac cath 03/21/19 with 3/4 patent bypass grafts. The SVG to the PDA is known to be occluded. A drug eluting stent was placed in the distal RCA. -denies any anginal sx -continue Plavix 75mg  daily, statin and BB  3.  PAF -maintaining NSR on exam -continue Carvedilol 25mg  BID and warfarin -denies any bleeding problems on warfarin  4.  Chronic combined S/D CHF -he appears euvolemic on exam -he denies any SOB or LE edema -continue Lasix 20mg  BID -creatinine stable at 1.26 in Nov 2020  5.  HTN -BP well controlled -continue Carvedilol 25mg  BID, Hydralazine 25mg  TID and spiro 25mg  daily  6.  HLD -LDL goal < 70 -LDL was 56 earlier this month -continue atorvastatin 80mg  daily   Medication Adjustments/Labs and Tests Ordered: Current medicines are reviewed at length with the patient today.  Concerns regarding medicines are outlined above.  No orders of the defined types were placed in this encounter.  No orders of the defined types were placed in this encounter.   Signed, 2016, MD  07/28/2019 3:01 PM    [facility]

## 2019-07-28 NOTE — Patient Instructions (Signed)
Medication Instructions:  Your physician recommends that you continue on your current medications as directed. Please refer to the Current Medication list given to you today.  *If you need a refill on your cardiac medications before your next appointment, please call your pharmacy*   Testing/Procedures: Your physician has requested that you have an echocardiogram in one year. Echocardiography is a painless test that uses sound waves to create images of your heart. It provides your doctor with information about the size and shape of your heart and how well your heart's chambers and valves are working. This procedure takes approximately one hour. There are no restrictions for this procedure.   Follow-Up: At [facility], you and your health needs are our priority.  As part of our continuing mission to provide you with exceptional heart care, we have created designated Provider Care Teams.  These Care Teams include your primary Cardiologist (physician) and Advanced Practice Providers (APPs -  Physician Assistants and Nurse Practitioners) who all work together to provide you with the care you need, when you need it.  We recommend signing up for the patient portal called "MyChart".  Sign up information is provided on this After Visit Summary.  MyChart is used to connect with patients for Virtual Visits (Telemedicine).  Patients are able to view lab/test results, encounter notes, upcoming appointments, etc.  Non-urgent messages can be sent to your provider as well.   To learn more about what you can do with MyChart, go to [email or web address].    Your next appointment:   6 month(s)  The format for your next appointment:   In Person  Provider:   Armanda Magic, MD

## 2019-07-30 ENCOUNTER — Encounter (HOSPITAL_COMMUNITY)
Admission: RE | Admit: 2019-07-30 | Discharge: 2019-07-30 | Disposition: A | Discharge status: Home / Self Care | Payer: Medicare Other | Source: Ambulatory Visit | Attending: Cardiovascular Disease | Admitting: Cardiovascular Disease

## 2019-07-30 ENCOUNTER — Other Ambulatory Visit: Payer: Self-pay

## 2019-07-30 DIAGNOSIS — Z952 Presence of prosthetic heart valve: Secondary | ICD-10-CM | POA: Diagnosis present

## 2019-07-30 NOTE — Progress Notes (Signed)
Camelia Phenes 79 y.o. male Nutrition Note  Visit Diagnosis: S/P TAVR (transcatheter aortic valve replacement)   Past Medical History:  Diagnosis Date  . Chronic renal insufficiency   . Coronary artery disease    a. s/p PCI of LAD 1997. b. Cutting balloon angioplasty to LCx in 2002. c. s/p cath 11/2013 with severe 3 vessel ASCAD s/p CABG with LIMA to LAD, SVG to diag, SVG to left circ and SVG to PDA. d. cath 07/07/2014 occluded SVG to PDA, all other grafts patent. 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 DJD (degenerative joint disease)   . Epistaxis   . GERD (gastroesophageal reflux disease)   . GI bleeding    a.  with benign gastric polypectomy 05/2014.  Marland Kitchen HLD (hyperlipidemia)   . HTN (hypertension)   . Obesity   . OSA (obstructive sleep apnea)   . Pacemaker    a. 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 b. MDT generator replacement 2012.  Marland Kitchen PAF (paroxysmal atrial fibrillation) (HCC)    on chronic systemic anticoagulation  . PVC's (premature ventricular contractions)   . 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 Severe aortic stenosis   . Syncope    a. vasovagal with documented bradycardia.  . Type 2 diabetes mellitus without complications ([place]) 11/29/2593     Medications reviewed.   Current Outpatient Medications:  .  acetaminophen (TYLENOL) 500 MG tablet, Take 1,000 mg by mouth daily as needed. , Disp: , Rfl:  .  atorvastatin (LIPITOR) 80 MG tablet, TAKE 1 TABLET BY MOUTH  DAILY, Disp: 90 tablet, Rfl: 2 .  carvedilol (COREG) 25 MG tablet, TAKE 1 TABLET(25 MG) BY MOUTH TWICE DAILY WITH A MEAL, Disp: 180 tablet, Rfl: 2 .  clindamycin (CLEOCIN) 300 MG capsule, Take 2 pills (600 mg) 2 hours before your dental procedure, Disp: 12 capsule, Rfl: 0 .  clopidogrel (PLAVIX) 75 MG tablet, Take 1 tablet (75 mg total) by  mouth daily with breakfast., Disp: 90 tablet, Rfl: 3 .  docusate sodium (COLACE) 100 MG capsule, Take 200 mg by mouth daily as needed for mild constipation. , Disp: , Rfl:  .  fexofenadine (ALLEGRA) 180 MG tablet, Take 180 mg by mouth daily. , Disp: , Rfl:  .  fluticasone (FLONASE) 50 MCG/ACT nasal spray, Place 2 sprays into the nose daily. , Disp: , Rfl:  .  furosemide (LASIX) 20 MG tablet, TAKE 1 TABLET BY MOUTH TWICE DAILY. WITH 80 MG TO TOTAL 100MG , Disp: 180 tablet, Rfl: 2 .  furosemide (LASIX) 80 MG tablet, TAKE 1 TABLET BY MOUTH TWICE DAILY. TAKE WITH 20 MG TABLET, Disp: 180 tablet, Rfl: 2 .  Glucosamine-Chondroit-Vit C-Mn (GLUCOSAMINE CHONDR 1500 COMPLX) CAPS, Take 1 capsule by mouth every morning. , Disp: , Rfl:  .  hydrALAZINE (APRESOLINE) 25 MG tablet, TAKE 1 TABLET(25 MG) BY MOUTH THREE TIMES DAILY, Disp: 270 tablet, Rfl: 2 .  isosorbide mononitrate (IMDUR) 30 MG 24 hr tablet, TAKE 1 TABLET BY MOUTH EVERY DAY, Disp: 90 tablet, Rfl: 2 .  L-Lysine 1000 MG TABS, Take 1,000 mg by mouth daily. , Disp: , Rfl:  .  meclizine (ANTIVERT) 25 MG tablet, Take 25 mg by mouth daily as needed for dizziness. , Disp: , Rfl:  .  Multiple  Vitamin (MULTIVITAMIN) tablet, Take 1 tablet by mouth daily. Centrum silver, Disp: , Rfl:  .  nitroGLYCERIN (NITROSTAT) 0.4 MG SL tablet, Place 1 tablet (0.4 mg total) under the tongue every 5 (five) minutes as needed for chest pain., Disp: 30 tablet, Rfl: 3 .  ONE TOUCH ULTRA TEST test strip, 1 each by Other route every morning. , Disp: , Rfl:  .  ONETOUCH DELICA LANCETS FINE MISC, 1 each by Other route every morning. , Disp: , Rfl:  .  pantoprazole (PROTONIX) 40 MG tablet, Take 40 mg by mouth daily. , Disp: , Rfl:  .  potassium chloride SA (K-DUR) 20 MEQ tablet, TAKE 2 TABLETS BY MOUTH TWICE DAILY, Disp: 360 tablet, Rfl: 2 .  RA NATURAL MAGNESIUM 250 MG TABS, TAKE 2 TABLETS TWICE DAILY., Disp: 120 tablet, Rfl: 5 .  spironolactone (ALDACTONE) 25 MG tablet, TAKE 1  TABLET(25 MG) BY MOUTH DAILY, Disp: 90 tablet, Rfl: 2 .  warfarin (COUMADIN) 5 MG tablet, Take 1 to 1.5 tablets daily as directed by Coumadin clinic, Disp: 135 tablet, Rfl: 1   Ht Readings from Last 1 Encounters:  07/28/19 5\' 7"  (1.702 m)     Wt Readings from Last 3 Encounters:  07/28/19 246 lb (111.6 kg)  07/22/19 247 lb 2.2 oz (112.1 kg)  06/25/19 246 lb (111.6 kg)     There is no height or weight on file to calculate BMI.   Social History   Tobacco Use  Smoking Status Former Smoker  . Packs/day: 2.00  . Years: 5.00  . Pack years: 10.00  . Types: Cigarettes  . Quit date: 05/29/1966  . Years since quitting: 53.2  Smokeless Tobacco Never Used     Lab Results  Component Value Date   CHOL 129 07/07/2019   Lab Results  Component Value Date   HDL 44 07/07/2019   Lab Results  Component Value Date   LDLCALC 56 07/07/2019   Lab Results  Component Value Date   TRIG 173 (H) 07/07/2019   Lab Results  Component Value Date   CHOLHDL 2.9 07/07/2019     Lab Results  Component Value Date   HGBA1C 6.3 (H) 04/04/2019     CBG (last 3)  No results for input(s): GLUCAP in the last 72 hours.   Nutrition Note  Spoke with pt. Nutrition Plan and Nutrition Survey goals reviewed with pt.   13/10/2018 has lost weight previously. He lost about 40 lbs a few years ago. He made some adjustments to his diet including decreased portions and eating 2 meals per day. He does snack between meals.  Pt has Type 2 Diabetes. Last A1c indicates blood glucose well-controlled. Pt checks CBG's 1 times a day. Fasting CBG's reportedly 140-150 mg/dL.   Pt with dx of CHF. Per discussion, pt does use canned/convenience foods often. Pt does not add salt to food. Pt does not eat out frequently.   Pt expressed understanding of the information reviewed.    Nutrition Diagnosis ? Food-and nutrition-related knowledge deficit related to lack of exposure to information as related to diagnosis of: ? CVD ? Type 2  Diabetes  Nutrition Intervention ? Pt's individual nutrition plan reviewed with pt. ? Benefits of adopting Heart Healthy diet discussed when Medficts reviewed.   ? Continue client-centered nutrition education by RD, as part of interdisciplinary care.  Goal(s) ? Pt to build a healthy plate including vegetables, fruits, whole grains, and low-fat dairy products in a heart healthy meal plan. ? CBG concentrations in  the normal range or as close to normal as is safely possible.   Plan:   Will provide client-centered nutrition education as part of interdisciplinary care  Monitor and evaluate progress toward nutrition goal with team.   Andrey Campanile, MS, RDN, LDN

## 2019-07-30 NOTE — Progress Notes (Signed)
Daily Session Note  Patient Details  Name: Brandon Klein MRN: [id number] Date of Birth: 1940/09/22 Referring Provider:     CARDIAC REHAB PHASE II ORIENTATION from 07/22/2019 in [facility]  Referring Provider  Lauree Chandler, MD      Encounter Date: 07/30/2019  Check In: Session Check In - 07/30/19 1324      Check-In   Supervising physician immediately available to respond to emergencies  [facility] immediately available    Physician(s)  Dr. Durenda Hurt    Location  [facility]    Staff Present  Jiles Garter, RN, Deland Pretty, MS, ACSM CEP, Exercise Physiologist;Brittany Durene Fruits, BS, ACSM CEP, Exercise Physiologist;Tyara Carol Ada, MS,ACSM CEP, Exercise Physiologist    Virtual Visit  No    Medication changes reported      No    Fall or balance concerns reported     No    Tobacco Cessation  No Change    Warm-up and Cool-down  Performed on first and last piece of equipment    Resistance Training Performed  No    VAD Patient?  No    PAD/SET Patient?  No      Pain Assessment   Currently in Pain?  No/denies    Pain Score  0-No pain    Multiple Pain Sites  No       Capillary Blood Glucose: No results found for this or any previous visit (from the past 24 hour(s)).  Exercise Prescription Changes - 07/30/19 1500      Response to Exercise   Blood Pressure (Admit)  110/62    Blood Pressure (Exercise)  118/64    Blood Pressure (Exit)  124/68    Heart Rate (Admit)  84 bpm    Heart Rate (Exercise)  83 bpm    Heart Rate (Exit)  81 bpm    Rating of Perceived Exertion (Exercise)  11    Symptoms  None    Comments  Pt first day exercise.     Duration  Continue with 30 min of aerobic exercise without signs/symptoms of physical distress.    Intensity  THRR unchanged      Progression   Progression  Continue to progress workloads to maintain intensity without signs/symptoms of physical distress.    Average METs  2      Resistance Training   Training Prescription  No      Interval Training   Interval Training  No      NuStep   Level  2    SPM  85    Minutes  30    METs  2       Social History   Tobacco Use  Smoking Status Former Smoker  . Packs/day: 2.00  . Years: 5.00  . Pack years: 10.00  . Types: Cigarettes  . Quit date: 05/29/1966  . Years since quitting: 53.2  Smokeless Tobacco Never Used    Goals Met:  Exercise tolerated well  Goals Unmet:  Not Applicable  Comments: Pt started cardiac rehab today.  Pt tolerated light exercise without difficulty. VSS, telemetry-AV paced, asymptomatic.  Medication list reconciled. Pt denies barriers to medicaiton compliance.  PSYCHOSOCIAL ASSESSMENT:  PHQ-0. Pt exhibits positive coping skills, hopeful outlook with supportive family. No psychosocial needs identified at this time, no psychosocial interventions necessary.  Pt oriented to exercise equipment and routine.    Understanding verbalized.   Dr. Fransico Him is Medical Director for Cardiac Rehab at [facility]  Hospital.

## 2019-08-01 ENCOUNTER — Encounter (HOSPITAL_COMMUNITY)
Admission: RE | Admit: 2019-08-01 | Discharge: 2019-08-01 | Disposition: A | Discharge status: Home / Self Care | Payer: Medicare Other | Source: Ambulatory Visit | Attending: Cardiovascular Disease | Admitting: Cardiovascular Disease

## 2019-08-01 ENCOUNTER — Other Ambulatory Visit: Payer: Self-pay

## 2019-08-01 DIAGNOSIS — Z952 Presence of prosthetic heart valve: Secondary | ICD-10-CM | POA: Diagnosis not present

## 2019-08-04 ENCOUNTER — Other Ambulatory Visit: Payer: Self-pay

## 2019-08-04 ENCOUNTER — Encounter (HOSPITAL_COMMUNITY)
Admission: RE | Admit: 2019-08-04 | Discharge: 2019-08-04 | Disposition: A | Discharge status: Home / Self Care | Payer: Medicare Other | Source: Ambulatory Visit | Attending: Cardiovascular Disease | Admitting: Cardiovascular Disease

## 2019-08-04 ENCOUNTER — Ambulatory Visit: Payer: Medicare Other | Admitting: *Deleted

## 2019-08-04 DIAGNOSIS — Z5181 Encounter for therapeutic drug level monitoring: Secondary | ICD-10-CM

## 2019-08-04 DIAGNOSIS — Z952 Presence of prosthetic heart valve: Secondary | ICD-10-CM | POA: Diagnosis not present

## 2019-08-04 DIAGNOSIS — I480 Paroxysmal atrial fibrillation: Secondary | ICD-10-CM | POA: Diagnosis not present

## 2019-08-04 LAB — POCT INR: INR: 1.8 — AB (ref 2.0–3.0)

## 2019-08-04 NOTE — Patient Instructions (Signed)
Description   Today take 2 tablets then continue taking same dosage 1.5 tablets every day except 1 tablet on Tuesdays, Thursdays and Saturdays.  Recheck INR 4 weeks.  Coumadin clinic [phone].

## 2019-08-05 ENCOUNTER — Ambulatory Visit (INDEPENDENT_AMBULATORY_CARE_PROVIDER_SITE_OTHER): Payer: Medicare Other | Admitting: *Deleted

## 2019-08-05 DIAGNOSIS — R001 Bradycardia, unspecified: Secondary | ICD-10-CM

## 2019-08-05 LAB — CUP PACEART REMOTE DEVICE CHECK
Battery Impedance: 1065 Ohm
Battery Remaining Longevity: 65 mo
Battery Voltage: 2.77 V
Brady Statistic AP VP Percent: 1 %
Brady Statistic AP VS Percent: 50 %
Brady Statistic AS VP Percent: 1 %
Brady Statistic AS VS Percent: 48 %
Date Time Interrogation Session: 20210309115211
Implantable Lead Implant Date: 20020926
Implantable Lead Implant Date: 20020926
Implantable Lead Location: 753859
Implantable Lead Location: 753860
Implantable Lead Model: 5076
Implantable Lead Model: 5092
Implantable Pulse Generator Implant Date: 20120905
Lead Channel Impedance Value: 511 Ohm
Lead Channel Impedance Value: 943 Ohm
Lead Channel Pacing Threshold Amplitude: 0.625 V
Lead Channel Pacing Threshold Amplitude: 1 V
Lead Channel Pacing Threshold Pulse Width: 0.4 ms
Lead Channel Pacing Threshold Pulse Width: 0.4 ms
Lead Channel Setting Pacing Amplitude: 2 V
Lead Channel Setting Pacing Amplitude: 2.5 V
Lead Channel Setting Pacing Pulse Width: 0.4 ms
Lead Channel Setting Sensing Sensitivity: 4 mV

## 2019-08-05 NOTE — Progress Notes (Signed)
PPM Remote  

## 2019-08-06 ENCOUNTER — Other Ambulatory Visit: Payer: Self-pay

## 2019-08-06 ENCOUNTER — Encounter (HOSPITAL_COMMUNITY)
Admission: RE | Admit: 2019-08-06 | Discharge: 2019-08-06 | Disposition: A | Discharge status: Home / Self Care | Payer: Medicare Other | Source: Ambulatory Visit | Attending: Cardiovascular Disease | Admitting: Cardiovascular Disease

## 2019-08-06 DIAGNOSIS — Z952 Presence of prosthetic heart valve: Secondary | ICD-10-CM | POA: Diagnosis not present

## 2019-08-07 NOTE — Progress Notes (Signed)
I have reviewed a Home Exercise Prescription with Brandon Klein . Brandon Klein is not currently exercising at home.  The patient was advised to walk 5-7 days a week for 5-10 minutes.  Brandon Klein and I discussed how to progress their exercise prescription.  The patient stated that their goals were to start walking 5-10 minutes once it starts to get warmer outside.  The patient stated that they understand the exercise prescription.  We reviewed exercise guidelines, target heart rate during exercise, RPE Scale, weather conditions, NTG use, endpoints for exercise, warmup and cool down.  Patient is encouraged to come to me with any questions. I will continue to follow up with the patient to assist them with progression and safety.    Prentice Docker BS, ACSM CEP 08/06/2019 1315

## 2019-08-07 NOTE — Progress Notes (Signed)
Cardiac Individual Treatment Plan  Patient Details  Klein: Brandon Klein MRN: [id number] Date of Birth: 01-21-1941 Referring Provider:     CARDIAC REHAB PHASE II ORIENTATION from 07/22/2019 in [facility] CARDIAC [facility]  Referring Provider  Verne Carrow, MD      Initial Encounter Date:    CARDIAC REHAB PHASE II ORIENTATION from 07/22/2019 in [facility] CARDIAC REHAB  Date  07/22/19      Visit Diagnosis: S/P TAVR (transcatheter aortic valve replacement)  Patient's Home Medications on Admission:  Current Outpatient Medications:  .  acetaminophen (TYLENOL) 500 MG tablet, Take 1,000 mg by mouth daily as needed. , Disp: , Rfl:  .  atorvastatin (LIPITOR) 80 MG tablet, TAKE 1 TABLET BY MOUTH  DAILY, Disp: 90 tablet, Rfl: 2 .  carvedilol (COREG) 25 MG tablet, TAKE 1 TABLET(25 MG) BY MOUTH TWICE DAILY WITH A MEAL, Disp: 180 tablet, Rfl: 2 .  clindamycin (CLEOCIN) 300 MG capsule, Take 2 pills (600 mg) 2 hours before your dental procedure, Disp: 12 capsule, Rfl: 0 .  clopidogrel (PLAVIX) 75 MG tablet, Take 1 tablet (75 mg total) by mouth daily with breakfast., Disp: 90 tablet, Rfl: 3 .  docusate sodium (COLACE) 100 MG capsule, Take 200 mg by mouth daily as needed for mild constipation. , Disp: , Rfl:  .  fexofenadine (ALLEGRA) 180 MG tablet, Take 180 mg by mouth daily. , Disp: , Rfl:  .  fluticasone (FLONASE) 50 MCG/ACT nasal spray, Place 2 sprays into the nose daily. , Disp: , Rfl:  .  furosemide (LASIX) 20 MG tablet, TAKE 1 TABLET BY MOUTH TWICE DAILY. WITH 80 MG TO TOTAL 100MG , Disp: 180 tablet, Rfl: 2 .  furosemide (LASIX) 80 MG tablet, TAKE 1 TABLET BY MOUTH TWICE DAILY. TAKE WITH 20 MG TABLET, Disp: 180 tablet, Rfl: 2 .  Glucosamine-Chondroit-Vit C-Mn (GLUCOSAMINE CHONDR 1500 COMPLX) CAPS, Take 1 capsule by mouth every morning. , Disp: , Rfl:  .  hydrALAZINE (APRESOLINE) 25 MG tablet, TAKE 1 TABLET(25 MG) BY MOUTH THREE TIMES DAILY, Disp: 270 tablet,  Rfl: 2 .  isosorbide mononitrate (IMDUR) 30 MG 24 hr tablet, TAKE 1 TABLET BY MOUTH EVERY DAY, Disp: 90 tablet, Rfl: 2 .  L-Lysine 1000 MG TABS, Take 1,000 mg by mouth daily. , Disp: , Rfl:  .  meclizine (ANTIVERT) 25 MG tablet, Take 25 mg by mouth daily as needed for dizziness. , Disp: , Rfl:  .  Multiple Vitamin (MULTIVITAMIN) tablet, Take 1 tablet by mouth daily. Centrum silver, Disp: , Rfl:  .  nitroGLYCERIN (NITROSTAT) 0.4 MG SL tablet, Place 1 tablet (0.4 mg total) under the tongue every 5 (five) minutes as needed for chest pain., Disp: 30 tablet, Rfl: 3 .  ONE TOUCH ULTRA TEST test strip, 1 each by Other route every morning. , Disp: , Rfl:  .  ONETOUCH DELICA LANCETS FINE MISC, 1 each by Other route every morning. , Disp: , Rfl:  .  pantoprazole (PROTONIX) 40 MG tablet, Take 40 mg by mouth daily. , Disp: , Rfl:  .  potassium chloride SA (K-DUR) 20 MEQ tablet, TAKE 2 TABLETS BY MOUTH TWICE DAILY, Disp: 360 tablet, Rfl: 2 .  RA NATURAL MAGNESIUM 250 MG TABS, TAKE 2 TABLETS TWICE DAILY., Disp: 120 tablet, Rfl: 5 .  spironolactone (ALDACTONE) 25 MG tablet, TAKE 1 TABLET(25 MG) BY MOUTH DAILY, Disp: 90 tablet, Rfl: 2 .  warfarin (COUMADIN) 5 MG tablet, Take 1 to 1.5 tablets daily as directed  by Coumadin clinic, Disp: 135 tablet, Rfl: 1  Past Medical History: Past Medical History:  Diagnosis Date  . Chronic renal insufficiency   . Coronary artery disease    a. s/p PCI of LAD 1997. b. Cutting balloon angioplasty to LCx in 2002. c. s/p cath 11/2013 with severe 3 vessel ASCAD s/p CABG with LIMA to LAD, SVG to diag, SVG to left circ and SVG to PDA. d. cath 07/07/2014 occluded SVG to PDA, all other grafts patent. 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 DJD (degenerative joint disease)   . Epistaxis   . GERD (gastroesophageal reflux disease)   . GI bleeding    a.  with benign gastric polypectomy 05/2014.  Marland Kitchen HLD (hyperlipidemia)   . HTN (hypertension)   . Obesity   . OSA (obstructive sleep apnea)   . Pacemaker     a. 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 b. MDT generator replacement 2012.  Marland Kitchen PAF (paroxysmal atrial fibrillation) (HCC)    on chronic systemic anticoagulation  . PVC's (premature ventricular contractions)   . 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 Severe aortic stenosis   . Syncope    a. vasovagal with documented bradycardia.  . Type 2 diabetes mellitus without complications (HCC) 12/02/2015    Tobacco Use: Social History   Tobacco Use  Smoking Status Former Smoker  . Packs/day: 2.00  . Years: 5.00  . Pack years: 10.00  . Types: Cigarettes  . Quit date: 05/29/1966  . Years since quitting: 53.2  Smokeless Tobacco Never Used    Labs: Recent Review Flowsheet Data    Labs for ITP Cardiac and Pulmonary Rehab Latest Ref Rng & Units 04/08/2019 04/08/2019 04/08/2019 04/08/2019 07/07/2019   Cholestrol 100 - 199 mg/dL - - - - 811   LDLCALC 0 - 99 mg/dL - - - - 56   LDLDIRECT mg/dL - - - - -   HDL >91 mg/dL - - - - 44   Trlycerides 0 - 149 mg/dL - - - - 478(G)   Hemoglobin A1c 4.8 - 5.6 % - - - - -   PHART 7.350 - 7.450 - - - - -   PCO2ART 32.0 - 48.0 mmHg - - - - -   HCO3 20.0 - 28.0 mmol/L - - - - -   TCO2 22 - 32 mmol/L -   ACIDBASEDEF 0.0 - 2.0 mmol/L - - - - -   O2SAT % - - - - -      Capillary Blood Glucose: Lab Results  Component Value Date   GLUCAP 147 (H) 04/08/2019   GLUCAP 133 (H) 04/04/2019   GLUCAP 142 (H) 03/21/2019   GLUCAP 138 (H) 12/05/2013   GLUCAP 133 (H) 12/05/2013     Exercise Target Goals: Exercise Program Goal: Individual exercise prescription set using results from initial 6 min walk test and THRR while considering  patient's activity barriers and safety.   Exercise Prescription Goal: Initial exercise prescription builds to 30-45 minutes a day of  aerobic activity, 2-3 days per week.  Home exercise guidelines will be given to patient during program as part of exercise prescription that the participant will acknowledge.  Activity Barriers & Risk Stratification: Activity Barriers & Cardiac Risk Stratification -  07/22/19 1401      Activity Barriers & Cardiac Risk Stratification   Activity Barriers  Arthritis;Assistive Device;Balance Concerns;Other (comment)    Comments  Left knee meniscus surgery, right knee arthritis, vertigo.    Cardiac Risk Stratification  High       6 Minute Walk: 6 Minute Walk    Row Klein 07/22/19 1423         6 Minute Walk   Phase  Initial     Distance  788 feet     Walk Time  6 minutes     # of Rest Breaks  0     MPH  1.49     METS  0.83     RPE  13     Perceived Dyspnea   0     VO2 Peak  2.92     Symptoms  No     Resting HR  68 bpm     Resting BP  118/72     Resting Oxygen Saturation   95 %     Exercise Oxygen Saturation  during 6 min walk  96 %     Max Ex. HR  96 bpm     Max Ex. BP  124/76     2 Minute Post BP  118/78        Oxygen Initial Assessment:   Oxygen Re-Evaluation:   Oxygen Discharge (Final Oxygen Re-Evaluation):   Initial Exercise Prescription: Initial Exercise Prescription - 07/22/19 1400      Date of Initial Exercise RX and Referring Provider   Date  07/22/19    Referring Provider  Verne Carrow, MD    Expected Discharge Date  09/19/19      Recumbant Bike   Level  1    Minutes  15    METs  1.8      NuStep   Level  2    SPM  85    Minutes  15    METs  1.8      Prescription Details   Frequency (times per week)  3    Duration  Progress to 30 minutes of continuous aerobic without signs/symptoms of physical distress      Intensity   THRR 40-80% of Max Heartrate  57-114    Ratings of Perceived Exertion  11-13    Perceived Dyspnea  0-4      Progression   Progression  Continue to progress workloads to maintain intensity without signs/symptoms of  physical distress.      Resistance Training   Training Prescription  Yes    Weight  3lbs.    Reps  10-15       Perform Capillary Blood Glucose checks as needed.  Exercise Prescription Changes: Exercise Prescription Changes    Row Klein 07/30/19 1500 08/04/19 1315           Response to Exercise   Blood Pressure (Admit)  110/62  114/72      Blood Pressure (Exercise)  118/64  128/70      Blood Pressure (Exit)  124/68  104/62      Heart Rate (Admit)  84 bpm  92 bpm      Heart Rate (Exercise)  83 bpm  92 bpm      Heart Rate (Exit)  81 bpm  65 bpm      Rating of Perceived Exertion (Exercise)  11  12      Symptoms  None  None      Comments  Pt first day exercise.   --      Duration  Continue with 30 min of aerobic exercise without signs/symptoms of physical distress.  Continue with 30 min of aerobic exercise without signs/symptoms of physical distress.      Intensity  THRR unchanged  THRR unchanged        Progression   Progression  Continue to progress workloads to maintain intensity without signs/symptoms of physical distress.  Continue to progress workloads to maintain intensity without signs/symptoms of physical distress.      Average METs  2  2.3        Resistance Training   Training Prescription  No  Yes      Weight  --  3 lbs.       Reps  --  10-15      Time  --  10 Minutes        Interval Training   Interval Training  No  No        NuStep   Level  2  3      SPM  85  85      Minutes  30  15      METs  2  2.8        Arm Ergometer   Level  --  2      Minutes  --  15      METs  --  1.9         Exercise Comments: Exercise Comments    Row Klein 07/30/19 1512 08/06/19 1315         Exercise Comments  Pt first day of exercise. Pt tolerated exercise well.  Reviewed HEP with Pt. Pt is progressing well but not exericisng at home.         Exercise Goals and Review: Exercise Goals    Row Klein 07/22/19 1403             Exercise Goals   Increase Physical  Activity  Yes       Intervention  Provide advice, education, support and counseling about physical activity/exercise needs.;Develop an individualized exercise prescription for aerobic and resistive training based on initial evaluation findings, risk stratification, comorbidities and participant's personal goals.       Expected Outcomes  Short Term: Attend rehab on a regular basis to increase amount of physical activity.;Long Term: Exercising regularly at least 3-5 days a week.;Long Term: Add in home exercise to make exercise part of routine and to increase amount of physical activity.       Increase Strength and Stamina  Yes       Intervention  Provide advice, education, support and counseling about physical activity/exercise needs.;Develop an individualized exercise prescription for aerobic and resistive training based on initial evaluation findings, risk stratification, comorbidities and participant's personal goals.       Expected Outcomes  Short Term: Increase workloads from initial exercise prescription for resistance, speed, and METs.;Short Term: Perform resistance training exercises routinely during rehab and add in resistance training at home;Long Term: Improve cardiorespiratory fitness, muscular endurance and strength as measured by increased METs and functional capacity ( )       Able to understand and use rate of perceived exertion (RPE) scale  Yes       Intervention  Provide education and explanation on how to use RPE scale       Expected Outcomes  Short Term: Able to use RPE daily in rehab to express subjective intensity level;Long Term:  Able to use RPE to guide intensity level when exercising independently       Knowledge and understanding of Target Heart Rate Range (THRR)  Yes       Intervention  Provide education and explanation of THRR including how the numbers were predicted and where they are located for reference       Expected Outcomes  Short Term: Able to state/look up THRR;Long  Term: Able to use THRR to govern intensity when exercising independently;Short Term: Able to use daily as guideline for intensity in rehab       Able to check pulse independently  Yes       Intervention  Provide education and demonstration on how to check pulse in carotid and radial arteries.;Review the importance of being able to check your own pulse for safety during independent exercise       Expected Outcomes  Short Term: Able to explain why pulse checking is important during independent exercise;Long Term: Able to check pulse independently and accurately       Understanding of Exercise Prescription  Yes       Intervention  Provide education, explanation, and written materials on patient's individual exercise prescription       Expected Outcomes  Short Term: Able to explain program exercise prescription;Long Term: Able to explain home exercise prescription to exercise independently          Exercise Goals Re-Evaluation : Exercise Goals Re-Evaluation    Row Klein 07/30/19 1512 08/06/19 1315           Exercise Goal Re-Evaluation   Exercise Goals Review  Increase Physical Activity;Understanding of Exercise Prescription;Increase Strength and Stamina;Able to understand and use rate of perceived exertion (RPE) scale  Increase Physical Activity;Increase Strength and Stamina;Able to understand and use rate of perceived exertion (RPE) scale;Knowledge and understanding of Target Heart Rate Range (THRR);Able to check pulse independently;Understanding of Exercise Prescription      Comments  Pt first day of exercise in CR program. Pt tolerated exercise well and understands RPE scale.  Reviewed HEP with Pt. Pt is not currently exercising at home. Pt stated he is waiting for the temperatures to be warmer before wlaking outside. Encouraged Pt to start walking in 5 minute intervals because of his knee pain. Pt understands THRR, RPE scale, weather precautions, end points of exercise, and NTG use.      Expected  Outcomes  Will continue to monitor and progress Pt as tolerated.  Will continue to monitor and porgress Pt as tolerated.         Discharge Exercise Prescription (Final Exercise Prescription Changes): Exercise Prescription Changes - 08/04/19 1315      Response to Exercise   Blood Pressure (Admit)  114/72    Blood Pressure (Exercise)  128/70    Blood Pressure (Exit)  104/62    Heart Rate (Admit)  92 bpm    Heart Rate (Exercise)  92 bpm    Heart Rate (Exit)  65 bpm    Rating of Perceived Exertion (Exercise)  12    Symptoms  None    Duration  Continue with 30 min of aerobic exercise without signs/symptoms of physical distress.    Intensity  THRR unchanged      Progression   Progression  Continue to progress workloads to maintain intensity without signs/symptoms of physical distress.    Average METs  2.3      Resistance Training   Training Prescription  Yes    Weight  3 lbs.  Reps  10-15    Time  10 Minutes      Interval Training   Interval Training  No      NuStep   Level  3    SPM  85    Minutes  15    METs  2.8      Arm Ergometer   Level  2    Minutes  15    METs  1.9       Nutrition:  Target Goals: Understanding of nutrition guidelines, daily intake of sodium 1500mg , cholesterol 200mg , calories 30% from fat and 7% or less from saturated fats, daily to have 5 or more servings of fruits and vegetables.  Biometrics: Pre Biometrics - 07/22/19 1355      Pre Biometrics   Height   (1.702 m)    Weight  112.1 kg    Waist Circumference  46.5 inches    Hip Circumference  47 inches    Waist to Hip Ratio  0.99 %    BMI (Calculated)  38.7    Triceps Skinfold  25 mm    % Body Fat  37.4 %    Grip Strength  32.5 kg    Flexibility  13.75 in    Single Leg Stand  1.93 seconds        Nutrition Therapy Plan and Nutrition Goals: Nutrition Therapy & Goals - 07/30/19 1438      Nutrition Therapy   Diet  Heart healthy/carb modified    Drug/Food Interactions   Statins/Certain Fruits      Personal Nutrition Goals   Nutrition Goal  Pt to build a healthy plate including vegetables, fruits, whole grains, and low-fat dairy products in a heart healthy meal plan.    Personal Goal #2  CBG concentrations in the normal range or as close to normal as is safely possible.      Intervention Plan   Intervention  Prescribe, educate and counsel regarding individualized specific dietary modifications aiming towards targeted core components such as weight, hypertension, lipid management, diabetes, heart failure and other comorbidities.    Expected Outcomes  Short Term Goal: Understand basic principles of dietary content, such as calories, fat, sodium, cholesterol and nutrients.       Nutrition Assessments: Nutrition Assessments - 07/30/19 1439      MEDFICTS Scores   Pre Score  31       Nutrition Goals Re-Evaluation: Nutrition Goals Re-Evaluation    Row Klein 07/30/19 1439             Goals   Current Weight  246 lb (111.6 kg)       Nutrition Goal  Pt to build a healthy plate including vegetables, fruits, whole grains, and low-fat dairy products in a heart healthy meal plan.         Personal Goal #2 Re-Evaluation   Personal Goal #2  CBG concentrations in the normal range or as close to normal as is safely possible.          Nutrition Goals Re-Evaluation: Nutrition Goals Re-Evaluation    Row Klein 07/30/19 1439             Goals   Current Weight  246 lb (111.6 kg)       Nutrition Goal  Pt to build a healthy plate including vegetables, fruits, whole grains, and low-fat dairy products in a heart healthy meal plan.         Personal Goal #2 Re-Evaluation   Personal  Goal #2  CBG concentrations in the normal range or as close to normal as is safely possible.          Nutrition Goals Discharge (Final Nutrition Goals Re-Evaluation): Nutrition Goals Re-Evaluation - 07/30/19 1439      Goals   Current Weight  246 lb (111.6 kg)    Nutrition Goal  Pt  to build a healthy plate including vegetables, fruits, whole grains, and low-fat dairy products in a heart healthy meal plan.      Personal Goal #2 Re-Evaluation   Personal Goal #2  CBG concentrations in the normal range or as close to normal as is safely possible.       Psychosocial: Target Goals: Acknowledge presence or absence of significant depression and/or stress, maximize coping skills, provide positive support system. Participant is able to verbalize types and ability to use techniques and skills needed for reducing stress and depression.  Initial Review & Psychosocial Screening: Initial Psych Review & Screening - 07/22/19 1411      Initial Review   Current issues with  None Identified      Family Dynamics   Good Support System?  Yes   Brandon Klein states his wife is a soure of support.   Comments  Brandon Klein is eager to return to cardiac rehab.      Barriers   Psychosocial barriers to participate in program  There are no identifiable barriers or psychosocial needs.       Quality of Life Scores: Quality of Life - 07/22/19 1453      Quality of Life   Select  Quality of Life      Quality of Life Scores   Health/Function Pre  23.73 %    Socioeconomic Pre  26 %    Psych/Spiritual Pre  22 %    Family Pre  23.2 %    GLOBAL Pre  23.7 %      Scores of 19 and below usually indicate a poorer quality of life in these areas.  A difference of  2-3 points is a clinically meaningful difference.  A difference of 2-3 points in the total score of the Quality of Life Index has been associated with significant improvement in overall quality of life, self-image, physical symptoms, and general health in studies assessing change in quality of life.  PHQ-9: Recent Review Flowsheet Data    Depression screen [facility] 2/9 07/22/2019   Decreased Interest 0   Down, Depressed, Hopeless 0   PHQ - 2 Score 0     Interpretation of Total Score  Total Score Depression Severity:  1-4 = Minimal depression, 5-9 =  Mild depression, 10-14 = Moderate depression, 15-19 = Moderately severe depression, 20-27 = Severe depression   Psychosocial Evaluation and Intervention: Psychosocial Evaluation - 07/30/19 1658      Psychosocial Evaluation & Interventions   Interventions  Encouraged to exercise with the program and follow exercise prescription    Comments  No psychosocial barriers. Brandon Klein enjoys watching sports on TV and reading.    Expected Outcomes  Brandon Klein will maintain a positive outlook with good coping skills.    Continue Psychosocial Services   No Follow up required       Psychosocial Re-Evaluation: Psychosocial Re-Evaluation    Brandon Klein 07/30/19 1700             Psychosocial Re-Evaluation   Current issues with  None Identified       Comments  No psychosocial barriers necessary.  Expected Outcomes  Brandon Klein will maintain a positive outlook with good coping skills.       Interventions  Encouraged to attend Cardiac Rehabilitation for the exercise;Stress management education;Relaxation education       Continue Psychosocial Services   No Follow up required          Psychosocial Discharge (Final Psychosocial Re-Evaluation): Psychosocial Re-Evaluation - 07/30/19 1700      Psychosocial Re-Evaluation   Current issues with  None Identified    Comments  No psychosocial barriers necessary.    Expected Outcomes  Brandon Klein will maintain a positive outlook with good coping skills.    Interventions  Encouraged to attend Cardiac Rehabilitation for the exercise;Stress management education;Relaxation education    Continue Psychosocial Services   No Follow up required       Vocational Rehabilitation: Provide vocational rehab assistance to qualifying candidates.   Vocational Rehab Evaluation & Intervention: Vocational Rehab - 07/22/19 1412      Initial Vocational Rehab Evaluation & Intervention   Assessment shows need for Vocational Rehabilitation  No       Education: Education Goals: Education classes  will be provided on a weekly basis, covering required topics. Participant will state understanding/return demonstration of topics presented.  Learning Barriers/Preferences: Learning Barriers/Preferences - 07/23/19 1344      Learning Barriers/Preferences   Learning Barriers  Sight    Learning Preferences  Written Material;Skilled Demonstration       Education Topics: Count Your Pulse:  -Group instruction provided by verbal instruction, demonstration, patient participation and written materials to support subject.  Instructors address importance of being able to find your pulse and how to count your pulse when at home without a heart monitor.  Patients get hands on experience counting their pulse with staff help and individually.   Heart Attack, Angina, and Risk Factor Modification:  -Group instruction provided by verbal instruction, video, and written materials to support subject.  Instructors address signs and symptoms of angina and heart attacks.    Also discuss risk factors for heart disease and how to make changes to improve heart health risk factors.   Functional Fitness:  -Group instruction provided by verbal instruction, demonstration, patient participation, and written materials to support subject.  Instructors address safety measures for doing things around the house.  Discuss how to get up and down off the floor, how to pick things up properly, how to safely get out of a chair without assistance, and balance training.   Meditation and Mindfulness:  -Group instruction provided by verbal instruction, patient participation, and written materials to support subject.  Instructor addresses importance of mindfulness and meditation practice to help reduce stress and improve awareness.  Instructor also leads participants through a meditation exercise.    Stretching for Flexibility and Mobility:  -Group instruction provided by verbal instruction, patient participation, and written  materials to support subject.  Instructors lead participants through series of stretches that are designed to increase flexibility thus improving mobility.  These stretches are additional exercise for major muscle groups that are typically performed during regular warm up and cool down.   Hands Only CPR:  -Group verbal, video, and participation provides a basic overview of AHA guidelines for community CPR. Role-play of emergencies allow participants the opportunity to practice calling for help and chest compression technique with discussion of AED use.   Hypertension: -Group verbal and written instruction that provides a basic overview of hypertension including the most recent diagnostic guidelines, risk factor reduction with self-care instructions and  medication management.    Nutrition I class: Heart Healthy Eating:  -Group instruction provided by PowerPoint slides, verbal discussion, and written materials to support subject matter. The instructor gives an explanation and review of the Therapeutic Lifestyle Changes diet recommendations, which includes a discussion on lipid goals, dietary fat, sodium, fiber, plant stanol/sterol esters, sugar, and the components of a well-balanced, healthy diet.   Nutrition II class: Lifestyle Skills:  -Group instruction provided by PowerPoint slides, verbal discussion, and written materials to support subject matter. The instructor gives an explanation and review of label reading, grocery shopping for heart health, heart healthy recipe modifications, and ways to make healthier choices when eating out.   Diabetes Question & Answer:  -Group instruction provided by PowerPoint slides, verbal discussion, and written materials to support subject matter. The instructor gives an explanation and review of diabetes co-morbidities, pre- and post-prandial blood glucose goals, pre-exercise blood glucose goals, signs, symptoms, and treatment of hypoglycemia and  hyperglycemia, and foot care basics.   Diabetes Blitz:  -Group instruction provided by PowerPoint slides, verbal discussion, and written materials to support subject matter. The instructor gives an explanation and review of the physiology behind type 1 and type 2 diabetes, diabetes medications and rational behind using different medications, pre- and post-prandial blood glucose recommendations and Hemoglobin A1c goals, diabetes diet, and exercise including blood glucose guidelines for exercising safely.    Portion Distortion:  -Group instruction provided by PowerPoint slides, verbal discussion, written materials, and food models to support subject matter. The instructor gives an explanation of serving size versus portion size, changes in portions sizes over the last 20 years, and what consists of a serving from each food group.   Stress Management:  -Group instruction provided by verbal instruction, video, and written materials to support subject matter.  Instructors review role of stress in heart disease and how to cope with stress positively.     Exercising on Your Own:  -Group instruction provided by verbal instruction, power point, and written materials to support subject.  Instructors discuss benefits of exercise, components of exercise, frequency and intensity of exercise, and end points for exercise.  Also discuss use of nitroglycerin and activating EMS.  Review options of places to exercise outside of rehab.  Review guidelines for sex with heart disease.   Cardiac Drugs I:  -Group instruction provided by verbal instruction and written materials to support subject.  Instructor reviews cardiac drug classes: antiplatelets, anticoagulants, beta blockers, and statins.  Instructor discusses reasons, side effects, and lifestyle considerations for each drug class.   Cardiac Drugs II:  -Group instruction provided by verbal instruction and written materials to support subject.  Instructor  reviews cardiac drug classes: angiotensin converting enzyme inhibitors (ACE-I), angiotensin II receptor blockers (ARBs), nitrates, and calcium channel blockers.  Instructor discusses reasons, side effects, and lifestyle considerations for each drug class.   Anatomy and Physiology of the Circulatory System:  Group verbal and written instruction and models provide basic cardiac anatomy and physiology, with the coronary electrical and arterial systems. Review of: AMI, Angina, Valve disease, Heart Failure, Peripheral Artery Disease, Cardiac Arrhythmia, Pacemakers, and the ICD.   Other Education:  -Group or individual verbal, written, or video instructions that support the educational goals of the cardiac rehab program.   Holiday Eating Survival Tips:  -Group instruction provided by PowerPoint slides, verbal discussion, and written materials to support subject matter. The instructor gives patients tips, tricks, and techniques to help them not only survive but enjoy the holidays despite  the onslaught of food that accompanies the holidays.   Knowledge Questionnaire Score: Knowledge Questionnaire Score - 07/22/19 1443      Knowledge Questionnaire Score   Pre Score  21/24       Core Components/Risk Factors/Patient Goals at Admission: Personal Goals and Risk Factors at Admission - 07/22/19 1405      Core Components/Risk Factors/Patient Goals on Admission    Weight Management  Yes;Obesity    Intervention  Weight Management/Obesity: Establish reasonable short term and long term weight goals.;Obesity: Provide education and appropriate resources to help participant work on and attain dietary goals.    Admit Weight  247 lb 2.2 oz (112.1 kg)    Expected Outcomes  Short Term: Continue to assess and modify interventions until short term weight is achieved;Long Term: Adherence to nutrition and physical activity/exercise program aimed toward attainment of established weight goal;Weight Loss: Understanding  of general recommendations for a balanced deficit meal plan, which promotes 1-2 lb weight loss per week and includes a negative energy balance of [phone] kcal/d;Understanding of distribution of calorie intake throughout the day with the consumption of 4-5 meals/snacks    Diabetes  Yes    Intervention  Provide education about signs/symptoms and action to take for hypo/hyperglycemia.;Provide education about proper nutrition, including hydration, and aerobic/resistive exercise prescription along with prescribed medications to achieve blood glucose in normal ranges: Fasting glucose 65-99 mg/dL    Expected Outcomes  Short Term: Participant verbalizes understanding of the signs/symptoms and immediate care of hyper/hypoglycemia, proper foot care and importance of medication, aerobic/resistive exercise and nutrition plan for blood glucose control.;Long Term: Attainment of HbA1C < 7%.    Hypertension  Yes    Intervention  Provide education on lifestyle modifcations including regular physical activity/exercise, weight management, moderate sodium restriction and increased consumption of fresh fruit, vegetables, and low fat dairy, alcohol moderation, and smoking cessation.;Monitor prescription use compliance.    Expected Outcomes  Short Term: Continued assessment and intervention until BP is < 140/69mm HG in hypertensive participants. < 130/27mm HG in hypertensive participants with diabetes, heart failure or chronic kidney disease.;Long Term: Maintenance of blood pressure at goal levels.    Lipids  Yes    Intervention  Provide education and support for participant on nutrition & aerobic/resistive exercise along with prescribed medications to achieve LDL 70mg , HDL >40mg .    Expected Outcomes  Short Term: Participant states understanding of desired cholesterol values and is compliant with medications prescribed. Participant is following exercise prescription and nutrition guidelines.;Long Term: Cholesterol controlled  with medications as prescribed, with individualized exercise RX and with personalized nutrition plan. Value goals: LDL < , HDL > 40 mg.       Core Components/Risk Factors/Patient Goals Review:  Goals and Risk Factor Review    Row Klein 07/30/19 1701             Core Components/Risk Factors/Patient Goals Review   Personal Goals Review  Weight Management/Obesity;Diabetes;Hypertension;Lipids       Review  Pt with multiple CAD RFs willing to participate in CR.  Brandon Klein would like to get back into exercise and delay any decline.       Expected Outcomes  Brandon Klein will continue to participate in CR exercise, nutrition, and lifestyle modification opportunities.          Core Components/Risk Factors/Patient Goals at Discharge (Final Review):  Goals and Risk Factor Review - 07/30/19 1701      Core Components/Risk Factors/Patient Goals Review   Personal Goals Review  Weight Management/Obesity;Diabetes;Hypertension;Lipids  Review  Pt with multiple CAD RFs willing to participate in CR.  Brandon Klein would like to get back into exercise and delay any decline.    Expected Outcomes  Brandon Klein will continue to participate in CR exercise, nutrition, and lifestyle modification opportunities.       ITP Comments: ITP Comments    Row Klein 07/22/19 1411 07/30/19 1657         ITP Comments  Dr. Armanda Magic, Medical Director  30 Day ITP Review.  Brandon Klein started exercise today and tolerated it well.         Comments: See ITP Comments.

## 2019-08-08 ENCOUNTER — Other Ambulatory Visit: Payer: Self-pay

## 2019-08-08 ENCOUNTER — Encounter (HOSPITAL_COMMUNITY)
Admission: RE | Admit: 2019-08-08 | Discharge: 2019-08-08 | Disposition: A | Discharge status: Home / Self Care | Payer: Medicare Other | Source: Ambulatory Visit | Attending: Cardiovascular Disease | Admitting: Cardiovascular Disease

## 2019-08-08 DIAGNOSIS — Z952 Presence of prosthetic heart valve: Secondary | ICD-10-CM

## 2019-08-11 ENCOUNTER — Other Ambulatory Visit: Payer: Self-pay

## 2019-08-11 ENCOUNTER — Encounter (HOSPITAL_COMMUNITY)
Admission: RE | Admit: 2019-08-11 | Discharge: 2019-08-11 | Disposition: A | Discharge status: Home / Self Care | Payer: Medicare Other | Source: Ambulatory Visit | Attending: Cardiovascular Disease | Admitting: Cardiovascular Disease

## 2019-08-11 DIAGNOSIS — Z952 Presence of prosthetic heart valve: Secondary | ICD-10-CM

## 2019-08-13 ENCOUNTER — Encounter (HOSPITAL_COMMUNITY)
Admission: RE | Admit: 2019-08-13 | Discharge: 2019-08-13 | Disposition: A | Discharge status: Home / Self Care | Payer: Medicare Other | Source: Ambulatory Visit | Attending: Cardiovascular Disease | Admitting: Cardiovascular Disease

## 2019-08-13 ENCOUNTER — Other Ambulatory Visit: Payer: Self-pay

## 2019-08-13 DIAGNOSIS — Z952 Presence of prosthetic heart valve: Secondary | ICD-10-CM | POA: Diagnosis not present

## 2019-08-15 ENCOUNTER — Other Ambulatory Visit: Payer: Self-pay

## 2019-08-15 ENCOUNTER — Encounter (HOSPITAL_COMMUNITY)
Admission: RE | Admit: 2019-08-15 | Discharge: 2019-08-15 | Disposition: A | Discharge status: Home / Self Care | Payer: Medicare Other | Source: Ambulatory Visit | Attending: Cardiovascular Disease | Admitting: Cardiovascular Disease

## 2019-08-15 DIAGNOSIS — Z952 Presence of prosthetic heart valve: Secondary | ICD-10-CM

## 2019-08-18 ENCOUNTER — Other Ambulatory Visit: Payer: Self-pay

## 2019-08-18 ENCOUNTER — Encounter (HOSPITAL_COMMUNITY)
Admission: RE | Admit: 2019-08-18 | Discharge: 2019-08-18 | Disposition: A | Discharge status: Home / Self Care | Payer: Medicare Other | Source: Ambulatory Visit | Attending: Cardiovascular Disease | Admitting: Cardiovascular Disease

## 2019-08-18 DIAGNOSIS — Z952 Presence of prosthetic heart valve: Secondary | ICD-10-CM

## 2019-08-20 ENCOUNTER — Encounter (HOSPITAL_COMMUNITY)
Admission: RE | Admit: 2019-08-20 | Discharge: 2019-08-20 | Disposition: A | Discharge status: Home / Self Care | Payer: Medicare Other | Source: Ambulatory Visit | Attending: Cardiovascular Disease | Admitting: Cardiovascular Disease

## 2019-08-20 ENCOUNTER — Other Ambulatory Visit: Payer: Self-pay

## 2019-08-20 DIAGNOSIS — Z952 Presence of prosthetic heart valve: Secondary | ICD-10-CM

## 2019-08-22 ENCOUNTER — Encounter (HOSPITAL_COMMUNITY)
Admission: RE | Admit: 2019-08-22 | Discharge: 2019-08-22 | Disposition: A | Discharge status: Home / Self Care | Payer: Medicare Other | Source: Ambulatory Visit | Attending: Cardiovascular Disease | Admitting: Cardiovascular Disease

## 2019-08-22 ENCOUNTER — Other Ambulatory Visit: Payer: Self-pay

## 2019-08-22 DIAGNOSIS — Z952 Presence of prosthetic heart valve: Secondary | ICD-10-CM | POA: Diagnosis not present

## 2019-08-25 ENCOUNTER — Encounter (HOSPITAL_COMMUNITY)
Admission: RE | Admit: 2019-08-25 | Discharge: 2019-08-25 | Disposition: A | Discharge status: Home / Self Care | Payer: Medicare Other | Source: Ambulatory Visit | Attending: Cardiovascular Disease | Admitting: Cardiovascular Disease

## 2019-08-25 ENCOUNTER — Other Ambulatory Visit: Payer: Self-pay

## 2019-08-25 DIAGNOSIS — Z952 Presence of prosthetic heart valve: Secondary | ICD-10-CM | POA: Diagnosis not present

## 2019-08-25 MED ORDER — ISOSORBIDE MONONITRATE ER 30 MG PO TB24: 30.0000 mg | ORAL_TABLET | Freq: Every day | ORAL | 2 refills | Status: DC

## 2019-08-25 MED ORDER — CARVEDILOL 25 MG PO TABS: ORAL_TABLET | ORAL | 2 refills | Status: DC

## 2019-08-25 MED ORDER — SPIRONOLACTONE 25 MG PO TABS: ORAL_TABLET | ORAL | 2 refills | Status: DC

## 2019-08-25 MED ORDER — WARFARIN SODIUM 5 MG PO TABS: ORAL_TABLET | ORAL | 1 refills | Status: DC

## 2019-08-27 ENCOUNTER — Other Ambulatory Visit: Payer: Self-pay

## 2019-08-27 ENCOUNTER — Encounter (HOSPITAL_COMMUNITY)
Admission: RE | Admit: 2019-08-27 | Discharge: 2019-08-27 | Disposition: A | Discharge status: Home / Self Care | Payer: Medicare Other | Source: Ambulatory Visit | Attending: Cardiovascular Disease | Admitting: Cardiovascular Disease

## 2019-08-27 DIAGNOSIS — Z952 Presence of prosthetic heart valve: Secondary | ICD-10-CM | POA: Diagnosis not present

## 2019-08-29 ENCOUNTER — Other Ambulatory Visit: Payer: Self-pay

## 2019-08-29 ENCOUNTER — Encounter (HOSPITAL_COMMUNITY)
Admission: RE | Admit: 2019-08-29 | Discharge: 2019-08-29 | Disposition: A | Discharge status: Home / Self Care | Payer: Medicare Other | Source: Ambulatory Visit | Attending: Cardiovascular Disease | Admitting: Cardiovascular Disease

## 2019-08-29 DIAGNOSIS — Z952 Presence of prosthetic heart valve: Secondary | ICD-10-CM | POA: Insufficient documentation

## 2019-09-01 ENCOUNTER — Other Ambulatory Visit: Payer: Self-pay

## 2019-09-01 ENCOUNTER — Ambulatory Visit: Payer: Medicare Other | Admitting: *Deleted

## 2019-09-01 ENCOUNTER — Encounter (HOSPITAL_COMMUNITY)
Admission: RE | Admit: 2019-09-01 | Discharge: 2019-09-01 | Disposition: A | Discharge status: Home / Self Care | Payer: Medicare Other | Source: Ambulatory Visit | Attending: Cardiovascular Disease | Admitting: Cardiovascular Disease

## 2019-09-01 DIAGNOSIS — I480 Paroxysmal atrial fibrillation: Secondary | ICD-10-CM | POA: Diagnosis not present

## 2019-09-01 DIAGNOSIS — Z952 Presence of prosthetic heart valve: Secondary | ICD-10-CM

## 2019-09-01 DIAGNOSIS — Z5181 Encounter for therapeutic drug level monitoring: Secondary | ICD-10-CM

## 2019-09-01 LAB — POCT INR: INR: 2.6 (ref 2.0–3.0)

## 2019-09-01 NOTE — Patient Instructions (Signed)
Description   Continue taking same dosage 1.5 tablets every day except 1 tablet on Tuesdays, Thursdays and Saturdays.  Recheck INR 5 weeks.  Coumadin clinic [phone].

## 2019-09-03 ENCOUNTER — Encounter (HOSPITAL_COMMUNITY)
Admission: RE | Admit: 2019-09-03 | Discharge: 2019-09-03 | Disposition: A | Discharge status: Home / Self Care | Payer: Medicare Other | Source: Ambulatory Visit | Attending: Cardiovascular Disease | Admitting: Cardiovascular Disease

## 2019-09-03 ENCOUNTER — Other Ambulatory Visit: Payer: Self-pay

## 2019-09-03 DIAGNOSIS — Z952 Presence of prosthetic heart valve: Secondary | ICD-10-CM

## 2019-09-04 NOTE — Progress Notes (Signed)
Cardiac Individual Treatment Plan  Patient Details  Name: Brandon Klein MRN: [id number] Date of Birth: 07/10/1940 Referring Provider:     CARDIAC REHAB PHASE II ORIENTATION from 07/22/2019 in [place]  Referring Provider  Lauree Chandler, MD      Initial Encounter Date:    CARDIAC REHAB PHASE II ORIENTATION from 07/22/2019 in [place]  Date  07/22/19      Visit Diagnosis: S/P TAVR (transcatheter aortic valve replacement)  Patient's Home Medications on Admission:  Current Outpatient Medications:  .  acetaminophen (TYLENOL) 500 MG tablet, Take 1,000 mg by mouth daily as needed. , Disp: , Rfl:  .  atorvastatin (LIPITOR) 80 MG tablet, TAKE 1 TABLET BY MOUTH  DAILY, Disp: 90 tablet, Rfl: 2 .  carvedilol (COREG) 25 MG tablet, TAKE 1 TABLET(25 MG) BY MOUTH TWICE DAILY WITH A MEAL, Disp: 180 tablet, Rfl: 2 .  clindamycin (CLEOCIN) 300 MG capsule, Take 2 pills (600 mg) 2 hours before your dental procedure, Disp: 12 capsule, Rfl: 0 .  clopidogrel (PLAVIX) 75 MG tablet, Take 1 tablet (75 mg total) by mouth daily with breakfast., Disp: 90 tablet, Rfl: 3 .  docusate sodium (COLACE) 100 MG capsule, Take 200 mg by mouth daily as needed for mild constipation. , Disp: , Rfl:  .  fexofenadine (ALLEGRA) 180 MG tablet, Take 180 mg by mouth daily. , Disp: , Rfl:  .  fluticasone (FLONASE) 50 MCG/ACT nasal spray, Place 2 sprays into the nose daily. , Disp: , Rfl:  .  furosemide (LASIX) 20 MG tablet, TAKE 1 TABLET BY MOUTH TWICE DAILY. WITH 80 MG TO TOTAL 100MG, Disp: 180 tablet, Rfl: 2 .  furosemide (LASIX) 80 MG tablet, TAKE 1 TABLET BY MOUTH TWICE DAILY. TAKE WITH 20 MG TABLET, Disp: 180 tablet, Rfl: 2 .  Glucosamine-Chondroit-Vit C-Mn (GLUCOSAMINE CHONDR 1500 COMPLX) CAPS, Take 1 capsule by mouth every morning. , Disp: , Rfl:  .  hydrALAZINE (APRESOLINE) 25 MG tablet, TAKE 1 TABLET(25 MG) BY MOUTH THREE TIMES DAILY, Disp: 270 tablet,  Rfl: 2 .  isosorbide mononitrate (IMDUR) 30 MG 24 hr tablet, Take 1 tablet (30 mg total) by mouth daily., Disp: 90 tablet, Rfl: 2 .  L-Lysine 1000 MG TABS, Take 1,000 mg by mouth daily. , Disp: , Rfl:  .  meclizine (ANTIVERT) 25 MG tablet, Take 25 mg by mouth daily as needed for dizziness. , Disp: , Rfl:  .  Multiple Vitamin (MULTIVITAMIN) tablet, Take 1 tablet by mouth daily. Centrum silver, Disp: , Rfl:  .  nitroGLYCERIN (NITROSTAT) 0.4 MG SL tablet, Place 1 tablet (0.4 mg total) under the tongue every 5 (five) minutes as needed for chest pain., Disp: 30 tablet, Rfl: 3 .  ONE TOUCH ULTRA TEST test strip, 1 each by Other route every morning. , Disp: , Rfl:  .  ONETOUCH DELICA LANCETS FINE MISC, 1 each by Other route every morning. , Disp: , Rfl:  .  pantoprazole (PROTONIX) 40 MG tablet, Take 40 mg by mouth daily. , Disp: , Rfl:  .  potassium chloride SA (K-DUR) 20 MEQ tablet, TAKE 2 TABLETS BY MOUTH TWICE DAILY, Disp: 360 tablet, Rfl: 2 .  RA NATURAL MAGNESIUM 250 MG TABS, TAKE 2 TABLETS TWICE DAILY., Disp: 120 tablet, Rfl: 5 .  spironolactone (ALDACTONE) 25 MG tablet, TAKE 1 TABLET(25 MG) BY MOUTH DAILY, Disp: 90 tablet, Rfl: 2 .  warfarin (COUMADIN) 5 MG tablet, Take 1 to 1.5 tablets daily  as directed by Coumadin clinic, Disp: 130 tablet, Rfl: 1  Past Medical History: Past Medical History:  Diagnosis Date  . Chronic renal insufficiency   . Coronary artery disease    a. s/p PCI of LAD 1997. b. Cutting balloon angioplasty to LCx in 2002. c. s/p cath 11/2013 with severe 3 vessel ASCAD s/p CABG with LIMA to LAD, SVG to diag, SVG to left circ and SVG to PDA. d. cath 07/07/2014 occluded SVG to PDA, all other grafts patent. 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 DJD (degenerative joint disease)   . Epistaxis   . GERD (gastroesophageal reflux disease)   . GI bleeding    a.  with benign gastric polypectomy 05/2014.  Marland Kitchen HLD (hyperlipidemia)   . HTN (hypertension)   . Obesity   . OSA (obstructive sleep apnea)    . Pacemaker    a. 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 b. MDT generator replacement 2012.  Marland Kitchen PAF (paroxysmal atrial fibrillation) (HCC)    on chronic systemic anticoagulation  . PVC's (premature ventricular contractions)   . 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 Severe aortic stenosis   . Syncope    a. vasovagal with documented bradycardia.  . [place] 2 [place] mellitus [place] complications [place]   Tobacco Use: Social History   Tobacco Use  Smoking Status Former Smoker  . Packs/day: 2.00  . Years: 5.00  . Pack years: 10.00  . Types: Cigarettes  . Quit date: 05/29/1966  . Years since quitting: 53.3  Smokeless Tobacco Never Used    Labs: Recent Review Flowsheet Data    Labs for ITP Cardiac and Pulmonary Rehab Latest Ref Rng & Units 04/08/2019 04/08/2019 04/08/2019 04/08/2019 07/07/2019   Cholestrol 100 - 199 mg/dL - - - - 129   LDLCALC 0 - 99 mg/dL - - - - 56   LDLDIRECT mg/dL - - - - -   HDL >39 mg/dL - - - - 44   Trlycerides 0 - 149 mg/dL - - - - 173(H)   Hemoglobin A1c 4.8 - 5.6 % - - - - -   PHART 7.350 - 7.450 - - - - -   PCO2ART 32.0 - 48.0 mmHg - - - - -   HCO3 20.0 - 28.0 mmol/L - - - - -   TCO2 22 - 32 mmol/L 28 22 29 28  -   ACIDBASEDEF 0.0 - 2.0 mmol/L - - - - -   O2SAT % - - - - -      Capillary Blood Glucose: Lab Results  Component Value Date   GLUCAP 147 (H) 04/08/2019   GLUCAP 133 (H) 04/04/2019   GLUCAP 142 (H) 03/21/2019   GLUCAP 138 (H) 12/05/2013   GLUCAP 133 (H) 12/05/2013     Exercise Target Goals: Exercise Program Goal: Individual exercise prescription set using results from initial 6 min walk test and THRR while considering  patient's activity barriers and safety.   Exercise Prescription Goal: Initial exercise prescription builds to 30-45  minutes a day of aerobic activity, 2-3 days per week.  Home exercise guidelines will be given to patient during program as part of exercise prescription that the participant will acknowledge.  Activity Barriers & Risk Stratification: Activity Barriers & Cardiac  Risk Stratification - 07/22/19 1401      Activity Barriers & Cardiac Risk Stratification   Activity Barriers  Arthritis;Assistive Device;Balance Concerns;Other (comment)    Comments  Left knee meniscus surgery, right knee arthritis, vertigo.    Cardiac Risk Stratification  High       6 Minute Walk: 6 Minute Walk    Row Name 07/22/19 1423         6 Minute Walk   Phase  Initial     Distance  788 feet     Walk Time  6 minutes     # of Rest Breaks  0     MPH  1.49     METS  0.83     RPE  13     Perceived Dyspnea   0     VO2 Peak  2.92     Symptoms  No     Resting HR  68 bpm     Resting BP  118/72     Resting Oxygen Saturation   95 %     Exercise Oxygen Saturation  during 6 min walk  96 %     Max Ex. HR  96 bpm     Max Ex. BP  124/76     2 Minute Post BP  118/78        Oxygen Initial Assessment:   Oxygen Re-Evaluation:   Oxygen Discharge (Final Oxygen Re-Evaluation):   Initial Exercise Prescription: Initial Exercise Prescription - 07/22/19 1400      Date of Initial Exercise RX and Referring Provider   Date  07/22/19    Referring Provider  Lauree Chandler, MD    Expected Discharge Date  09/19/19      Recumbant Bike   Level  1    Minutes  15    METs  1.8      NuStep   Level  2    SPM  85    Minutes  15    METs  1.8      Prescription Details   Frequency (times per week)  3    Duration  Progress to 30 minutes of continuous aerobic without signs/symptoms of physical distress      Intensity   THRR 40-80% of Max Heartrate  57-114    Ratings of Perceived Exertion  11-13    Perceived Dyspnea  0-4      Progression   Progression  Continue to progress workloads to maintain intensity without  signs/symptoms of physical distress.      Resistance Training   Training Prescription  Yes    Weight  3lbs.    Reps  10-15       Perform Capillary Blood Glucose checks as needed.  Exercise Prescription Changes: Exercise Prescription Changes    Row Name 07/30/19 1500 08/04/19 1315 08/20/19 1324 09/01/19 1326       Response to Exercise   Blood Pressure (Admit)  110/62  114/72  104/70  102/60    Blood Pressure (Exercise)  118/64  128/70  122/62  120/64    Blood Pressure (Exit)  124/68  104/62  94/58 recheck 106/69  100/62    Heart Rate (Admit)  84 bpm  92 bpm  75 bpm  77 bpm    Heart Rate (Exercise)  83 bpm  92 bpm  94 bpm  95 bpm    Heart Rate (Exit)  81 bpm  65 bpm  73 bpm  72 bpm    Rating of  Perceived Exertion (Exercise)  11  12  12  12     Symptoms  None  None  None  None    Comments  Pt first day exercise.   --  --  --    Duration  Continue with 30 min of aerobic exercise without signs/symptoms of physical distress.  Continue with 30 min of aerobic exercise without signs/symptoms of physical distress.  Continue with 30 min of aerobic exercise without signs/symptoms of physical distress.  Continue with 30 min of aerobic exercise without signs/symptoms of physical distress.    Intensity  THRR unchanged  THRR unchanged  THRR unchanged  THRR unchanged      Progression   Progression  Continue to progress workloads to maintain intensity without signs/symptoms of physical distress.  Continue to progress workloads to maintain intensity without signs/symptoms of physical distress.  Continue to progress workloads to maintain intensity without signs/symptoms of physical distress.  Continue to progress workloads to maintain intensity without signs/symptoms of physical distress.    Average METs  2  2.3  2.3  2.2      Resistance Training   Training Prescription  No  Yes  No  Yes    Weight  --  3 lbs.   --  4lbs.    Reps  --  10-15  --  10-15    Time  --  10 Minutes  --  10 Minutes       Interval Training   Interval Training  No  No  No  No      NuStep   Level  2  3  3  4     SPM  85  85  85  85    Minutes  30  15  15  15     METs  2  2.8  2.5  2.3      Arm Ergometer   Level  --  2  2  2     Minutes  --  15  15  15     METs  --  1.9  2  2       Home Exercise Plan   Plans to continue exercise at  --  --  Home (comment)  Home (comment)    Frequency  --  --  Add 1 additional day to program exercise sessions.  Add 1 additional day to program exercise sessions.    Initial Home Exercises Provided  --  --  08/06/19  08/06/19       Exercise Comments: Exercise Comments    Row Name 07/30/19 1512 08/06/19 1315 08/20/19 1324 08/22/19 1325 09/01/19 1330   Exercise Comments  Pt first day of exercise. Pt tolerated exercise well.  Reviewed HEP with Pt. Pt is progressing well but not exericisng at home.  Reviewed goals with patient.  Reviewed METs with patient.  Reviewed METs and goals with patient.      Exercise Goals and Review: Exercise Goals    Row Name 07/22/19 1403             Exercise Goals   Increase Physical Activity  Yes       Intervention  Provide advice, education, support and counseling about physical activity/exercise needs.;Develop an individualized exercise prescription for aerobic and resistive training based on initial evaluation findings, risk stratification, comorbidities and participant's personal goals.       Expected Outcomes  Short Term: Attend rehab on a regular basis to increase amount of physical activity.;Long Term: Exercising regularly at  least 3-5 days a week.;Long Term: Add in home exercise to make exercise part of routine and to increase amount of physical activity.       Increase Strength and Stamina  Yes       Intervention  Provide advice, education, support and counseling about physical activity/exercise needs.;Develop an individualized exercise prescription for aerobic and resistive training based on initial evaluation findings, risk  stratification, comorbidities and participant's personal goals.       Expected Outcomes  Short Term: Increase workloads from initial exercise prescription for resistance, speed, and METs.;Short Term: Perform resistance training exercises routinely during rehab and add in resistance training at home;Long Term: Improve cardiorespiratory fitness, muscular endurance and strength as measured by increased METs and functional capacity (6MWT)       Able to understand and use rate of perceived exertion (RPE) scale  Yes       Intervention  Provide education and explanation on how to use RPE scale       Expected Outcomes  Short Term: Able to use RPE daily in rehab to express subjective intensity level;Long Term:  Able to use RPE to guide intensity level when exercising independently       Knowledge and understanding of Target Heart Rate Range (THRR)  Yes       Intervention  Provide education and explanation of THRR including how the numbers were predicted and where they are located for reference       Expected Outcomes  Short Term: Able to state/look up THRR;Long Term: Able to use THRR to govern intensity when exercising independently;Short Term: Able to use daily as guideline for intensity in rehab       Able to check pulse independently  Yes       Intervention  Provide education and demonstration on how to check pulse in carotid and radial arteries.;Review the importance of being able to check your own pulse for safety during independent exercise       Expected Outcomes  Short Term: Able to explain why pulse checking is important during independent exercise;Long Term: Able to check pulse independently and accurately       Understanding of Exercise Prescription  Yes       Intervention  Provide education, explanation, and written materials on patient's individual exercise prescription       Expected Outcomes  Short Term: Able to explain program exercise prescription;Long Term: Able to explain home exercise  prescription to exercise independently          Exercise Goals Re-Evaluation : Exercise Goals Re-Evaluation    Row Name 07/30/19 1512 08/06/19 1315 08/20/19 1324 09/01/19 1330       Exercise Goal Re-Evaluation   Exercise Goals Review  Increase Physical Activity;Understanding of Exercise Prescription;Increase Strength and Stamina;Able to understand and use rate of perceived exertion (RPE) scale  Increase Physical Activity;Increase Strength and Stamina;Able to understand and use rate of perceived exertion (RPE) scale;Knowledge and understanding of Target Heart Rate Range (THRR);Able to check pulse independently;Understanding of Exercise Prescription  Increase Physical Activity;Increase Strength and Stamina;Able to understand and use rate of perceived exertion (RPE) scale;Knowledge and understanding of Target Heart Rate Range (THRR);Able to check pulse independently;Understanding of Exercise Prescription  Increase Physical Activity;Increase Strength and Stamina;Able to understand and use rate of perceived exertion (RPE) scale;Knowledge and understanding of Target Heart Rate Range (THRR);Able to check pulse independently;Understanding of Exercise Prescription    Comments  Pt first day of exercise in CR program. Pt tolerated exercise well and  understands RPE scale.  Reviewed HEP with Pt. Pt is not currently exercising at home. Pt stated he is waiting for the temperatures to be warmer before wlaking outside. Encouraged Pt to start walking in 5 minute intervals because of his knee pain. Pt understands THRR, RPE scale, weather precautions, end points of exercise, and NTG use.  Patient feels stronger since starting exercise in the cardiac rehab program. Patient is making slow steady progress with exercise. Patient isn't walking at home yet, he wants to wait until it gets warmer but plans to start walking at home using his cane.  Patient has increased workload on Nustep and hand weights from 3lbs to 4lbs. Patient  continues to do well with low intensity exercise. Patient is not walking at home but plans to walk as the weather has gotten warmer. Walking is limited by chronic knee pain.    Expected Outcomes  Will continue to monitor and progress Pt as tolerated.  Will continue to monitor and porgress Pt as tolerated.  Continue to increase workloads as tolerated to help increase strength and stamina. Patient will start walking at home as tolerated.  Continue to increase workloads as tolerated. Begin walking at home as tolerated.       Discharge Exercise Prescription (Final Exercise Prescription Changes): Exercise Prescription Changes - 09/01/19 1326      Response to Exercise   Blood Pressure (Admit)  102/60    Blood Pressure (Exercise)  120/64    Blood Pressure (Exit)  100/62    Heart Rate (Admit)  77 bpm    Heart Rate (Exercise)  95 bpm    Heart Rate (Exit)  72 bpm    Rating of Perceived Exertion (Exercise)  12    Symptoms  None    Duration  Continue with 30 min of aerobic exercise without signs/symptoms of physical distress.    Intensity  THRR unchanged      Progression   Progression  Continue to progress workloads to maintain intensity without signs/symptoms of physical distress.    Average METs  2.2      Resistance Training   Training Prescription  Yes    Weight  4lbs.    Reps  10-15    Time  10 Minutes      Interval Training   Interval Training  No      NuStep   Level  4    SPM  85    Minutes  15    METs  2.3      Arm Ergometer   Level  2    Minutes  15    METs  2      Home Exercise Plan   Plans to continue exercise at  Home (comment)    Frequency  Add 1 additional day to program exercise sessions.    Initial Home Exercises Provided  08/06/19       Nutrition:  Target Goals: Understanding of nutrition guidelines, daily intake of sodium <1549m, cholesterol <2058m calories 30% from fat and 7% or less from saturated fats, daily to have 5 or more servings of fruits and  vegetables.  Biometrics: Pre Biometrics - 07/22/19 1355      Pre Biometrics   Height  5' 7"  (1.702 m)    Weight  112.1 kg    Waist Circumference  46.5 inches    Hip Circumference  47 inches    Waist to Hip Ratio  0.99 %    BMI (Calculated)  38.7  Triceps Skinfold  25 mm    % Body Fat  37.4 %    Grip Strength  32.5 kg    Flexibility  13.75 in    Single Leg Stand  1.93 seconds        Nutrition Therapy Plan and Nutrition Goals: Nutrition Therapy & Goals - 07/30/19 1438      Nutrition Therapy   Diet  Heart healthy/carb modified    Drug/Food Interactions  Statins/Certain Fruits      Personal Nutrition Goals   Nutrition Goal  Pt to build a healthy plate including vegetables, fruits, whole grains, and low-fat dairy products in a heart healthy meal plan.    Personal Goal #2  CBG concentrations in the normal range or as close to normal as is safely possible.      Intervention Plan   Intervention  Prescribe, educate and counsel regarding individualized specific dietary modifications aiming towards targeted core components such as weight, hypertension, lipid management, diabetes, heart failure and other comorbidities.    Expected Outcomes  Short Term Goal: Understand basic principles of dietary content, such as calories, fat, sodium, cholesterol and nutrients.       Nutrition Assessments: Nutrition Assessments - 07/30/19 1439      MEDFICTS Scores   Pre Score  31       Nutrition Goals Re-Evaluation: Nutrition Goals Re-Evaluation    Row Name 07/30/19 1439 09/02/19 1458           Goals   Current Weight  246 lb (111.6 kg)  246 lb (111.6 kg)      Nutrition Goal  Pt to build a healthy plate including vegetables, fruits, whole grains, and low-fat dairy products in a heart healthy meal plan.  Pt to build a healthy plate including vegetables, fruits, whole grains, and low-fat dairy products in a heart healthy meal plan.        Personal Goal #2 Re-Evaluation   Personal Goal #2   CBG concentrations in the normal range or as close to normal as is safely possible.  CBG concentrations in the normal range or as close to normal as is safely possible.         Nutrition Goals Re-Evaluation: Nutrition Goals Re-Evaluation    [place] Name 07/30/19 1439 09/02/19 1458           Goals   Current Weight  246 lb (111.6 kg)  246 lb (111.6 kg)      Nutrition Goal  Pt to build a healthy plate including vegetables, fruits, whole grains, and low-fat dairy products in a heart healthy meal plan.  Pt to build a healthy plate including vegetables, fruits, whole grains, and low-fat dairy products in a heart healthy meal plan.        Personal Goal #2 Re-Evaluation   Personal Goal #2  CBG concentrations in the normal range or as close to normal as is safely possible.  CBG concentrations in the normal range or as close to normal as is safely possible.         Nutrition Goals Discharge (Final Nutrition Goals Re-Evaluation): Nutrition Goals Re-Evaluation - 09/02/19 1458      Goals   Current Weight  246 lb (111.6 kg)    Nutrition Goal  Pt to build a healthy plate including vegetables, fruits, whole grains, and low-fat dairy products in a heart healthy meal plan.      Personal Goal #2 Re-Evaluation   Personal Goal #2  CBG concentrations in the normal range or  as close to normal as is safely possible.       Psychosocial: Target Goals: Acknowledge presence or absence of significant depression and/or stress, maximize coping skills, provide positive support system. Participant is able to verbalize types and ability to use techniques and skills needed for reducing stress and depression.  Initial Review & Psychosocial Screening: Initial Psych Review & Screening - 07/22/19 1411      Initial Review   Current issues with  None Identified      Family Dynamics   Good Support System?  Yes   Clair Gulling states his wife is a soure of support.   Comments  Clair Gulling is eager to return to cardiac rehab.       Barriers   Psychosocial barriers to participate in program  There are no identifiable barriers or psychosocial needs.       Quality of Life Scores: Quality of Life - 07/22/19 1453      Quality of Life   Select  Quality of Life      Quality of Life Scores   Health/Function Pre  23.73 %    Socioeconomic Pre  26 %    Psych/Spiritual Pre  22 %    Family Pre  23.2 %    GLOBAL Pre  23.7 %      Scores of 19 and below usually indicate a poorer quality of life in these areas.  A difference of  2-3 points is a clinically meaningful difference.  A difference of 2-3 points in the total score of the Quality of Life Index has been associated with significant improvement in overall quality of life, self-image, physical symptoms, and general health in studies assessing change in quality of life.  PHQ-9: Recent Review Flowsheet Data    Depression screen [facility] 2/9 07/22/2019   Decreased Interest 0   Down, Depressed, Hopeless 0   PHQ - 2 Score 0     Interpretation of Total Score  Total Score Depression Severity:  1-4 = Minimal depression, 5-9 = Mild depression, 10-14 = Moderate depression, 15-19 = Moderately severe depression, 20-27 = Severe depression   Psychosocial Evaluation and Intervention: Psychosocial Evaluation - 07/30/19 1658      Psychosocial Evaluation & Interventions   Interventions  Encouraged to exercise with the program and follow exercise prescription    Comments  No psychosocial barriers. Clair Gulling enjoys watching sports on TV and reading.    Expected Outcomes  Clair Gulling will maintain a positive outlook with good coping skills.    Continue Psychosocial Services   No Follow up required       Psychosocial Re-Evaluation: Psychosocial Re-Evaluation    [place] Name 07/30/19 1700 09/01/19 1653           Psychosocial Re-Evaluation   Current issues with  None Identified  None Identified      Comments  No psychosocial barriers necessary.  No psychosocial barriers necessary.      Expected  Outcomes  Clair Gulling will maintain a positive outlook with good coping skills.  Clair Gulling will maintain a positive outlook with good coping skills.      Interventions  Encouraged to attend Cardiac Rehabilitation for the exercise;Stress management education;Relaxation education  Encouraged to attend Cardiac Rehabilitation for the exercise;Stress management education;Relaxation education      Continue Psychosocial Services   No Follow up required  No Follow up required         Psychosocial Discharge (Final Psychosocial Re-Evaluation): Psychosocial Re-Evaluation - 09/01/19 1653      Psychosocial  Re-Evaluation   Current issues with  None Identified    Comments  No psychosocial barriers necessary.    Expected Outcomes  Clair Gulling will maintain a positive outlook with good coping skills.    Interventions  Encouraged to attend Cardiac Rehabilitation for the exercise;Stress management education;Relaxation education    Continue Psychosocial Services   No Follow up required       Vocational Rehabilitation: Provide vocational rehab assistance to qualifying candidates.   Vocational Rehab Evaluation & Intervention: Vocational Rehab - 07/22/19 1412      Initial Vocational Rehab Evaluation & Intervention   Assessment shows need for Vocational Rehabilitation  No       Education: Education Goals: Education classes will be provided on a weekly basis, covering required topics. Participant will state understanding/return demonstration of topics presented.  Learning Barriers/Preferences: Learning Barriers/Preferences - 07/23/19 1344      Learning Barriers/Preferences   Learning Barriers  Sight    Learning Preferences  Written Material;Skilled Demonstration       Education Topics: Count Your Pulse:  -Group instruction provided by verbal instruction, demonstration, patient participation and written materials to support subject.  Instructors address importance of being able to find your pulse and how to count your  pulse when at home without a heart monitor.  Patients get hands on experience counting their pulse with staff help and individually.   Heart Attack, Angina, and Risk Factor Modification:  -Group instruction provided by verbal instruction, video, and written materials to support subject.  Instructors address signs and symptoms of angina and heart attacks.    Also discuss risk factors for heart disease and how to make changes to improve heart health risk factors.   Functional Fitness:  -Group instruction provided by verbal instruction, demonstration, patient participation, and written materials to support subject.  Instructors address safety measures for doing things around the house.  Discuss how to get up and down off the floor, how to pick things up properly, how to safely get out of a chair without assistance, and balance training.   Meditation and Mindfulness:  -Group instruction provided by verbal instruction, patient participation, and written materials to support subject.  Instructor addresses importance of mindfulness and meditation practice to help reduce stress and improve awareness.  Instructor also leads participants through a meditation exercise.    Stretching for Flexibility and Mobility:  -Group instruction provided by verbal instruction, patient participation, and written materials to support subject.  Instructors lead participants through series of stretches that are designed to increase flexibility thus improving mobility.  These stretches are additional exercise for major muscle groups that are typically performed during regular warm up and cool down.   Hands Only CPR:  -Group verbal, video, and participation provides a basic overview of AHA guidelines for community CPR. Role-play of emergencies allow participants the opportunity to practice calling for help and chest compression technique with discussion of AED use.   Hypertension: -Group verbal and written instruction that  provides a basic overview of hypertension including the most recent diagnostic guidelines, risk factor reduction with self-care instructions and medication management.    Nutrition I class: Heart Healthy Eating:  -Group instruction provided by PowerPoint slides, verbal discussion, and written materials to support subject matter. The instructor gives an explanation and review of the Therapeutic Lifestyle Changes diet recommendations, which includes a discussion on lipid goals, dietary fat, sodium, fiber, plant stanol/sterol esters, sugar, and the components of a well-balanced, healthy diet.   Nutrition II class: Lifestyle Skills:  -  Group instruction provided by PowerPoint slides, verbal discussion, and written materials to support subject matter. The instructor gives an explanation and review of label reading, grocery shopping for heart health, heart healthy recipe modifications, and ways to make healthier choices when eating out.   Diabetes Question & Answer:  -Group instruction provided by PowerPoint slides, verbal discussion, and written materials to support subject matter. The instructor gives an explanation and review of diabetes co-morbidities, pre- and post-prandial blood glucose goals, pre-exercise blood glucose goals, signs, symptoms, and treatment of hypoglycemia and hyperglycemia, and foot care basics.   Diabetes Blitz:  -Group instruction provided by PowerPoint slides, verbal discussion, and written materials to support subject matter. The instructor gives an explanation and review of the physiology behind type 1 and type 2 diabetes, diabetes medications and rational behind using different medications, pre- and post-prandial blood glucose recommendations and Hemoglobin A1c goals, diabetes diet, and exercise including blood glucose guidelines for exercising safely.    Portion Distortion:  -Group instruction provided by PowerPoint slides, verbal discussion, written materials, and food  models to support subject matter. The instructor gives an explanation of serving size versus portion size, changes in portions sizes over the last 20 years, and what consists of a serving from each food group.   Stress Management:  -Group instruction provided by verbal instruction, video, and written materials to support subject matter.  Instructors review role of stress in heart disease and how to cope with stress positively.     Exercising on Your Own:  -Group instruction provided by verbal instruction, power point, and written materials to support subject.  Instructors discuss benefits of exercise, components of exercise, frequency and intensity of exercise, and end points for exercise.  Also discuss use of nitroglycerin and activating EMS.  Review options of places to exercise outside of rehab.  Review guidelines for sex with heart disease.   Cardiac Drugs I:  -Group instruction provided by verbal instruction and written materials to support subject.  Instructor reviews cardiac drug classes: antiplatelets, anticoagulants, beta blockers, and statins.  Instructor discusses reasons, side effects, and lifestyle considerations for each drug class.   Cardiac Drugs II:  -Group instruction provided by verbal instruction and written materials to support subject.  Instructor reviews cardiac drug classes: angiotensin converting enzyme inhibitors (ACE-I), angiotensin II receptor blockers (ARBs), nitrates, and calcium channel blockers.  Instructor discusses reasons, side effects, and lifestyle considerations for each drug class.   Anatomy and Physiology of the Circulatory System:  Group verbal and written instruction and models provide basic cardiac anatomy and physiology, with the coronary electrical and arterial systems. Review of: AMI, Angina, Valve disease, Heart Failure, Peripheral Artery Disease, Cardiac Arrhythmia, Pacemakers, and the ICD.   Other Education:  -Group or individual verbal,  written, or video instructions that support the educational goals of the cardiac rehab program.   Holiday Eating Survival Tips:  -Group instruction provided by PowerPoint slides, verbal discussion, and written materials to support subject matter. The instructor gives patients tips, tricks, and techniques to help them not only survive but enjoy the holidays despite the onslaught of food that accompanies the holidays.   Knowledge Questionnaire Score: Knowledge Questionnaire Score - 07/22/19 1443      Knowledge Questionnaire Score   Pre Score  21/24       Core Components/Risk Factors/Patient Goals at Admission: Personal Goals and Risk Factors at Admission - 07/22/19 1405      Core Components/Risk Factors/Patient Goals on Admission    Weight Management  Yes;Obesity    Intervention  Weight Management/Obesity: Establish reasonable short term and long term weight goals.;Obesity: Provide education and appropriate resources to help participant work on and attain dietary goals.    Admit Weight  247 lb 2.2 oz (112.1 kg)    Expected Outcomes  Short Term: Continue to assess and modify interventions until short term weight is achieved;Long Term: Adherence to nutrition and physical activity/exercise program aimed toward attainment of established weight goal;Weight Loss: Understanding of general recommendations for a balanced deficit meal plan, which promotes 1-2 lb weight loss per week and includes a negative energy balance of [id number] kcal/d;Understanding of distribution of calorie intake throughout the day with the consumption of 4-5 meals/snacks    Diabetes  Yes    Intervention  Provide education about signs/symptoms and action to take for hypo/hyperglycemia.;Provide education about proper nutrition, including hydration, and aerobic/resistive exercise prescription along with prescribed medications to achieve blood glucose in normal ranges: Fasting glucose 65-99 mg/dL    Expected Outcomes  Short Term:  Participant verbalizes understanding of the signs/symptoms and immediate care of hyper/hypoglycemia, proper foot care and importance of medication, aerobic/resistive exercise and nutrition plan for blood glucose control.;Long Term: Attainment of HbA1C < 7%.    Hypertension  Yes    Intervention  Provide education on lifestyle modifcations including regular physical activity/exercise, weight management, moderate sodium restriction and increased consumption of fresh fruit, vegetables, and low fat dairy, alcohol moderation, and smoking cessation.;Monitor prescription use compliance.    Expected Outcomes  Short Term: Continued assessment and intervention until BP is < 140/1m HG in hypertensive participants. < 130/841mHG in hypertensive participants with diabetes, heart failure or chronic kidney disease.;Long Term: Maintenance of blood pressure at goal levels.    Lipids  Yes    Intervention  Provide education and support for participant on nutrition & aerobic/resistive exercise along with prescribed medications to achieve LDL <7065mHDL >66m71m  Expected Outcomes  Short Term: Participant states understanding of desired cholesterol values and is compliant with medications prescribed. Participant is following exercise prescription and nutrition guidelines.;Long Term: Cholesterol controlled with medications as prescribed, with individualized exercise RX and with personalized nutrition plan. Value goals: LDL < 70mg71mL > 40 mg.       Core Components/Risk Factors/Patient Goals Review:  Goals and Risk Factor Review    Row Name 07/30/19 1701 09/01/19 1653           Core Components/Risk Factors/Patient Goals Review   Personal Goals Review  Weight Management/Obesity;Diabetes;Hypertension;Lipids  Weight Management/Obesity;Diabetes;Hypertension;Lipids      Review  Pt with multiple CAD RFs willing to participate in CR.  Jim wClair Gullingd like to get back into exercise and delay any decline.  Pt with multiple CAD RFs  willing to participate in CR.  Jim cClair Gullinginues to tolerate exercise well.  He is maintaining his MET average where he feels he is working at a moderate level.  He is pleased with being able to return to this level of exercise as he was previously completing when in the maintenance program. VSS.      Expected Outcomes  Jim wClair Gulling continue to participate in CR exercise, nutrition, and lifestyle modification opportunities.  Jim wClair Gulling continue to participate in CR exercise, nutrition, and lifestyle modification opportunities.         Core Components/Risk Factors/Patient Goals at Discharge (Final Review):  Goals and Risk Factor Review - 09/01/19 1653      Core Components/Risk Factors/Patient Goals Review   Personal Goals Review  Weight Management/Obesity;Diabetes;Hypertension;Lipids    Review  Pt with multiple CAD RFs willing to participate in CR.  Clair Gulling continues to tolerate exercise well.  He is maintaining his MET average where he feels he is working at a moderate level.  He is pleased with being able to return to this level of exercise as he was previously completing when in the maintenance program. VSS.    Expected Outcomes  Clair Gulling will continue to participate in CR exercise, nutrition, and lifestyle modification opportunities.       ITP Comments: ITP Comments    Row Name 07/22/19 1411 07/30/19 1657 09/01/19 1652       ITP Comments  Dr. Fransico Him, Medical Director  30 Day ITP Review.  Clair Gulling started exercise today and tolerated it well.  30 Day ITP Review.  Clair Gulling continues to tolerate exercise well.  He is maintaining his MET average where he feels he is working at a moderate level.  He is pleased with being able to return to this level of exercise as he was previously completing when in the maintenance program.  VSS.        Comments: See ITP Comments.

## 2019-09-05 ENCOUNTER — Other Ambulatory Visit: Payer: Self-pay

## 2019-09-05 ENCOUNTER — Encounter (HOSPITAL_COMMUNITY)
Admission: RE | Admit: 2019-09-05 | Discharge: 2019-09-05 | Disposition: A | Discharge status: Home / Self Care | Payer: Medicare Other | Source: Ambulatory Visit | Attending: Cardiovascular Disease | Admitting: Cardiovascular Disease

## 2019-09-05 DIAGNOSIS — Z952 Presence of prosthetic heart valve: Secondary | ICD-10-CM

## 2019-09-08 ENCOUNTER — Other Ambulatory Visit: Payer: Self-pay

## 2019-09-08 ENCOUNTER — Encounter (HOSPITAL_COMMUNITY)
Admission: RE | Admit: 2019-09-08 | Discharge: 2019-09-08 | Disposition: A | Discharge status: Home / Self Care | Payer: Medicare Other | Source: Ambulatory Visit | Attending: Cardiovascular Disease | Admitting: Cardiovascular Disease

## 2019-09-08 DIAGNOSIS — Z952 Presence of prosthetic heart valve: Secondary | ICD-10-CM | POA: Diagnosis not present

## 2019-09-10 ENCOUNTER — Other Ambulatory Visit: Payer: Self-pay

## 2019-09-10 ENCOUNTER — Encounter (HOSPITAL_COMMUNITY)
Admission: RE | Admit: 2019-09-10 | Discharge: 2019-09-10 | Disposition: A | Discharge status: Home / Self Care | Payer: Medicare Other | Source: Ambulatory Visit | Attending: Cardiovascular Disease | Admitting: Cardiovascular Disease

## 2019-09-10 DIAGNOSIS — Z952 Presence of prosthetic heart valve: Secondary | ICD-10-CM

## 2019-09-12 ENCOUNTER — Other Ambulatory Visit: Payer: Self-pay

## 2019-09-12 ENCOUNTER — Encounter (HOSPITAL_COMMUNITY)
Admission: RE | Admit: 2019-09-12 | Discharge: 2019-09-12 | Disposition: A | Discharge status: Home / Self Care | Payer: Medicare Other | Source: Ambulatory Visit | Attending: Cardiovascular Disease | Admitting: Cardiovascular Disease

## 2019-09-12 DIAGNOSIS — Z952 Presence of prosthetic heart valve: Secondary | ICD-10-CM | POA: Diagnosis not present

## 2019-09-15 ENCOUNTER — Encounter (HOSPITAL_COMMUNITY)
Admission: RE | Admit: 2019-09-15 | Discharge: 2019-09-15 | Disposition: A | Discharge status: Home / Self Care | Payer: Medicare Other | Source: Ambulatory Visit | Attending: Cardiovascular Disease | Admitting: Cardiovascular Disease

## 2019-09-15 ENCOUNTER — Other Ambulatory Visit: Payer: Self-pay

## 2019-09-15 DIAGNOSIS — Z952 Presence of prosthetic heart valve: Secondary | ICD-10-CM | POA: Diagnosis not present

## 2019-09-17 ENCOUNTER — Encounter (HOSPITAL_COMMUNITY)
Admission: RE | Admit: 2019-09-17 | Discharge: 2019-09-17 | Disposition: A | Discharge status: Home / Self Care | Payer: Medicare Other | Source: Ambulatory Visit | Attending: Cardiovascular Disease | Admitting: Cardiovascular Disease

## 2019-09-17 ENCOUNTER — Other Ambulatory Visit: Payer: Self-pay

## 2019-09-17 DIAGNOSIS — Z952 Presence of prosthetic heart valve: Secondary | ICD-10-CM

## 2019-09-19 ENCOUNTER — Other Ambulatory Visit: Payer: Self-pay

## 2019-09-19 ENCOUNTER — Encounter (HOSPITAL_COMMUNITY)
Admission: RE | Admit: 2019-09-19 | Discharge: 2019-09-19 | Disposition: A | Discharge status: Home / Self Care | Payer: Medicare Other | Source: Ambulatory Visit | Attending: Cardiovascular Disease | Admitting: Cardiovascular Disease

## 2019-09-19 DIAGNOSIS — Z952 Presence of prosthetic heart valve: Secondary | ICD-10-CM

## 2019-09-22 ENCOUNTER — Other Ambulatory Visit: Payer: Self-pay

## 2019-09-22 ENCOUNTER — Encounter (HOSPITAL_COMMUNITY)
Admission: RE | Admit: 2019-09-22 | Discharge: 2019-09-22 | Disposition: A | Discharge status: Home / Self Care | Payer: Medicare Other | Source: Ambulatory Visit | Attending: Cardiovascular Disease | Admitting: Cardiovascular Disease

## 2019-09-22 VITALS — BP 120/70 | HR 82 | Temp 97.2°F | Ht 67.0 in | Wt 245.8 lb

## 2019-09-22 DIAGNOSIS — Z952 Presence of prosthetic heart valve: Secondary | ICD-10-CM

## 2019-09-22 NOTE — Progress Notes (Signed)
Discharge Progress Report  Patient Details  Name: Brandon Klein MRN: [id number] Date of Birth: 1941-01-01 Referring Provider:     CARDIAC REHAB PHASE II ORIENTATION from 07/22/2019 in [place]  Referring Provider  Lauree Chandler, MD       Number of Visits: 24  Reason for Discharge:  Patient reached a stable level of exercise. Patient independent in their exercise. Patient has met program and personal goals.  Smoking History:  Social History   Tobacco Use  Smoking Status Former Smoker  . Packs/day: 2.00  . Years: 5.00  . Pack years: 10.00  . Types: Cigarettes  . Quit date: 05/29/1966  . Years since quitting: 53.3  Smokeless Tobacco Never Used    Diagnosis:  S/P TAVR (transcatheter aortic valve replacement)  ADL UCSD:   Initial Exercise Prescription: Initial Exercise Prescription - 07/22/19 1400      Date of Initial Exercise RX and Referring Provider   Date  07/22/19    Referring Provider  Lauree Chandler, MD    Expected Discharge Date  09/19/19      Recumbant Bike   Level  1    Minutes  15    METs  1.8      NuStep   Level  2    SPM  85    Minutes  15    METs  1.8      Prescription Details   Frequency (times per week)  3    Duration  Progress to 30 minutes of continuous aerobic without signs/symptoms of physical distress      Intensity   THRR 40-80% of Max Heartrate  57-114    Ratings of Perceived Exertion  11-13    Perceived Dyspnea  0-4      Progression   Progression  Continue to progress workloads to maintain intensity without signs/symptoms of physical distress.      Resistance Training   Training Prescription  Yes    Weight  3lbs.    Reps  10-15       Discharge Exercise Prescription (Final Exercise Prescription Changes): Exercise Prescription Changes - 09/22/19 1325      Response to Exercise   Blood Pressure (Admit)  120/70    Blood Pressure (Exercise)  138/68    Blood Pressure (Exit)   98/52    Heart Rate (Admit)  82 bpm    Heart Rate (Exercise)  92 bpm    Heart Rate (Exit)  65 bpm    Rating of Perceived Exertion (Exercise)  12    Symptoms  None    Duration  Continue with 30 min of aerobic exercise without signs/symptoms of physical distress.    Intensity  THRR unchanged      Progression   Progression  Continue to progress workloads to maintain intensity without signs/symptoms of physical distress.    Average METs  2.2      Resistance Training   Training Prescription  Yes    Weight  4lbs.    Reps  10-15    Time  10 Minutes      Interval Training   Interval Training  No      NuStep   Level  4    SPM  85    Minutes  15    METs  2.4      Arm Ergometer   Level  2    Minutes  15    METs  1.9      Home  Exercise Plan   Plans to continue exercise at  Home (comment)    Frequency  Add 1 additional day to program exercise sessions.    Initial Home Exercises Provided  08/06/19       Functional Capacity: 6 Minute Walk    Row Name 07/22/19 1423 09/10/19 1317       6 Minute Walk   Phase  Initial  Discharge    Distance  788 feet  824 feet    Distance % Change  --  4.57 %    Distance Feet Change  --  36 ft    Walk Time  6 minutes  6 minutes    # of Rest Breaks  0  0    MPH  1.49  1.56    METS  0.83  0.9    RPE  13  13    Perceived Dyspnea   0  0    VO2 Peak  2.92  3.15    Symptoms  No  No    Resting HR  68 bpm  88 bpm    Resting BP  118/72  136/64    Resting Oxygen Saturation   95 %  --    Exercise Oxygen Saturation  during 6 min walk  96 %  --    Max Ex. HR  96 bpm  103 bpm    Max Ex. BP  124/76  118/62    2 Minute Post BP  118/78  98/60       Psychological, QOL, Others - Outcomes: PHQ 2/9: Depression screen PHQ 2/9 07/22/2019  Decreased Interest 0  Down, Depressed, Hopeless 0  PHQ - 2 Score 0  Some recent data might be hidden    Quality of Life: Quality of Life - 09/17/19 1656      Quality of Life   Select  Quality of Life       Quality of Life Scores   Health/Function Pre  23.73 %    Health/Function Post  24.23 %    Health/Function % Change  2.11 %    Socioeconomic Pre  26 %    Socioeconomic Post  27.67 %    Socioeconomic % Change   6.42 %    Psych/Spiritual Pre  22 %    Psych/Spiritual Post  22.71 %    Psych/Spiritual % Change  3.23 %    Family Pre  23.2 %    Family Post  26.2 %    Family % Change  12.93 %    GLOBAL Pre  23.7 %    GLOBAL Post  24.83 %    GLOBAL % Change  4.77 %       Personal Goals: Goals established at orientation with interventions provided to work toward goal. Personal Goals and Risk Factors at Admission - 07/22/19 1405      Core Components/Risk Factors/Patient Goals on Admission    Weight Management  Yes;Obesity    Intervention  Weight Management/Obesity: Establish reasonable short term and long term weight goals.;Obesity: Provide education and appropriate resources to help participant work on and attain dietary goals.    Admit Weight  247 lb 2.2 oz (112.1 kg)    Expected Outcomes  Short Term: Continue to assess and modify interventions until short term weight is achieved;Long Term: Adherence to nutrition and physical activity/exercise program aimed toward attainment of established weight goal;Weight Loss: Understanding of general recommendations for a balanced deficit meal plan, which promotes 1-2 lb weight loss per  week and includes a negative energy balance of [phone] kcal/d;Understanding of distribution of calorie intake throughout the day with the consumption of 4-5 meals/snacks    Diabetes  Yes    Intervention  Provide education about signs/symptoms and action to take for hypo/hyperglycemia.;Provide education about proper nutrition, including hydration, and aerobic/resistive exercise prescription along with prescribed medications to achieve blood glucose in normal ranges: Fasting glucose 65-99 mg/dL    Expected Outcomes  Short Term: Participant verbalizes understanding of the  signs/symptoms and immediate care of hyper/hypoglycemia, proper foot care and importance of medication, aerobic/resistive exercise and nutrition plan for blood glucose control.;Long Term: Attainment of HbA1C < 7%.    Hypertension  Yes    Intervention  Provide education on lifestyle modifcations including regular physical activity/exercise, weight management, moderate sodium restriction and increased consumption of fresh fruit, vegetables, and low fat dairy, alcohol moderation, and smoking cessation.;Monitor prescription use compliance.    Expected Outcomes  Short Term: Continued assessment and intervention until BP is < 140/29m HG in hypertensive participants. < 130/879mHG in hypertensive participants with diabetes, heart failure or chronic kidney disease.;Long Term: Maintenance of blood pressure at goal levels.    Lipids  Yes    Intervention  Provide education and support for participant on nutrition & aerobic/resistive exercise along with prescribed medications to achieve LDL <7052mHDL >86m42m  Expected Outcomes  Short Term: Participant states understanding of desired cholesterol values and is compliant with medications prescribed. Participant is following exercise prescription and nutrition guidelines.;Long Term: Cholesterol controlled with medications as prescribed, with individualized exercise RX and with personalized nutrition plan. Value goals: LDL < 70mg89mL > 40 mg.        Personal Goals Discharge: Goals and Risk Factor Review    Row Name 07/30/19 1701 09/01/19 1653 09/22/19 1600         Core Components/Risk Factors/Patient Goals Review   Personal Goals Review  Weight Management/Obesity;Diabetes;Hypertension;Lipids  Weight Management/Obesity;Diabetes;Hypertension;Lipids  --     Review  Pt with multiple CAD RFs willing to participate in CR.  Jim wClair Gullingd like to get back into exercise and delay any decline.  Pt with multiple CAD RFs willing to participate in CR.  Jim cClair Gullinginues to tolerate  exercise well.  He is maintaining his MET average where he feels he is working at a moderate level.  He is pleased with being able to return to this level of exercise as he was previously completing when in the maintenance program. VSS.  --     Expected Outcomes  Jim wClair Gulling continue to participate in CR exercise, nutrition, and lifestyle modification opportunities.  Jim wClair Gulling continue to participate in CR exercise, nutrition, and lifestyle modification opportunities.  Jim wClair Gulling continue lifestyle modifications and exercise post discharge from the cardiac rehab program. See ITP comments.        Exercise Goals and Review: Exercise Goals    Row Name 07/22/19 1403             Exercise Goals   Increase Physical Activity  Yes       Intervention  Provide advice, education, support and counseling about physical activity/exercise needs.;Develop an individualized exercise prescription for aerobic and resistive training based on initial evaluation findings, risk stratification, comorbidities and participant's personal goals.       Expected Outcomes  Short Term: Attend rehab on a regular basis to increase amount of physical activity.;Long Term: Exercising regularly at least 3-5 days a week.;Long Term: Add in home exercise to make  exercise part of routine and to increase amount of physical activity.       Increase Strength and Stamina  Yes       Intervention  Provide advice, education, support and counseling about physical activity/exercise needs.;Develop an individualized exercise prescription for aerobic and resistive training based on initial evaluation findings, risk stratification, comorbidities and participant's personal goals.       Expected Outcomes  Short Term: Increase workloads from initial exercise prescription for resistance, speed, and METs.;Short Term: Perform resistance training exercises routinely during rehab and add in resistance training at home;Long Term: Improve cardiorespiratory fitness,  muscular endurance and strength as measured by increased METs and functional capacity (6MWT)       Able to understand and use rate of perceived exertion (RPE) scale  Yes       Intervention  Provide education and explanation on how to use RPE scale       Expected Outcomes  Short Term: Able to use RPE daily in rehab to express subjective intensity level;Long Term:  Able to use RPE to guide intensity level when exercising independently       Knowledge and understanding of Target Heart Rate Range (THRR)  Yes       Intervention  Provide education and explanation of THRR including how the numbers were predicted and where they are located for reference       Expected Outcomes  Short Term: Able to state/look up THRR;Long Term: Able to use THRR to govern intensity when exercising independently;Short Term: Able to use daily as guideline for intensity in rehab       Able to check pulse independently  Yes       Intervention  Provide education and demonstration on how to check pulse in carotid and radial arteries.;Review the importance of being able to check your own pulse for safety during independent exercise       Expected Outcomes  Short Term: Able to explain why pulse checking is important during independent exercise;Long Term: Able to check pulse independently and accurately       Understanding of Exercise Prescription  Yes       Intervention  Provide education, explanation, and written materials on patient's individual exercise prescription       Expected Outcomes  Short Term: Able to explain program exercise prescription;Long Term: Able to explain home exercise prescription to exercise independently          Exercise Goals Re-Evaluation: Exercise Goals Re-Evaluation    Row Name 07/30/19 1512 08/06/19 1315 08/20/19 1324 09/01/19 1330 09/10/19 1546     Exercise Goal Re-Evaluation   Exercise Goals Review  Increase Physical Activity;Understanding of Exercise Prescription;Increase Strength and  Stamina;Able to understand and use rate of perceived exertion (RPE) scale  Increase Physical Activity;Increase Strength and Stamina;Able to understand and use rate of perceived exertion (RPE) scale;Knowledge and understanding of Target Heart Rate Range (THRR);Able to check pulse independently;Understanding of Exercise Prescription  Increase Physical Activity;Increase Strength and Stamina;Able to understand and use rate of perceived exertion (RPE) scale;Knowledge and understanding of Target Heart Rate Range (THRR);Able to check pulse independently;Understanding of Exercise Prescription  Increase Physical Activity;Increase Strength and Stamina;Able to understand and use rate of perceived exertion (RPE) scale;Knowledge and understanding of Target Heart Rate Range (THRR);Able to check pulse independently;Understanding of Exercise Prescription  Increase Physical Activity;Increase Strength and Stamina;Able to understand and use rate of perceived exertion (RPE) scale;Knowledge and understanding of Target Heart Rate Range (THRR);Able to check pulse independently;Understanding of Exercise  Prescription   Comments  Pt first day of exercise in CR program. Pt tolerated exercise well and understands RPE scale.  Reviewed HEP with Pt. Pt is not currently exercising at home. Pt stated he is waiting for the temperatures to be warmer before wlaking outside. Encouraged Pt to start walking in 5 minute intervals because of his knee pain. Pt understands THRR, RPE scale, weather precautions, end points of exercise, and NTG use.  Patient feels stronger since starting exercise in the cardiac rehab program. Patient is making slow steady progress with exercise. Patient isn't walking at home yet, he wants to wait until it gets warmer but plans to start walking at home using his cane.  Patient has increased workload on Nustep and hand weights from 3lbs to 4lbs. Patient continues to do well with low intensity exercise. Patient is not walking at  home but plans to walk as the weather has gotten warmer. Walking is limited by chronic knee pain.  Patient's functional capacity improved 4.6% as measured by 6MWT, strength increased 24.6% as measured by grip strength test, and flexibility increased 3.6% as measured by the sit and reach test. [facility].   Expected Outcomes  Will continue to monitor and progress Pt as tolerated.  Will continue to monitor and porgress Pt as tolerated.  Continue to increase workloads as tolerated to help increase strength and stamina. Patient will start walking at home as tolerated.  Continue to increase workloads as tolerated. Begin walking at home as tolerated.  Continue to increase workloads as tolerated. Begin walking at home as tolerated.   [place] Name 09/22/19 1325             Exercise Goal Re-Evaluation   Exercise Goals Review  Increase Physical Activity;Increase Strength and Stamina;Able to understand and use rate of perceived exertion (RPE) scale;Knowledge and understanding of Target Heart Rate Range (THRR);Able to check pulse independently;Understanding of Exercise Prescription       Comments  Patient completed the cardiac rehab program and tolerated low intensity exercise well.  [facility].       Expected Outcomes  Continue to increase workloads as tolerated. Begin walking at home as tolerated.          Nutrition & Weight - Outcomes: Pre Biometrics - 07/22/19 1355      Pre Biometrics   Height  5' 7" (1.702 m)    Weight  112.1 kg    Waist Circumference  46.5 inches    Hip Circumference  47 inches    Waist to Hip Ratio  0.99 %    BMI (Calculated)  38.7    Triceps Skinfold  25 mm    % Body Fat  37.4 %    Grip Strength  32.5 kg    Flexibility  13.75 in    Single Leg Stand  1.93 seconds      Post Biometrics - 09/22/19 1324       Post  Biometrics   Waist  Circumference  46.5 inches    Hip Circumference  47 inches    Waist to Hip Ratio  0.99 %    Triceps Skinfold  22 mm    % Body Fat  36.8 %    Grip Strength  40.5 kg    Flexibility  14 in    Single Leg Stand  5.06 seconds  Nutrition: Nutrition Therapy & Goals - 07/30/19 1438      Nutrition Therapy   Diet  Heart healthy/carb modified    Drug/Food Interactions  Statins/Certain Fruits      Personal Nutrition Goals   Nutrition Goal  Pt to build a healthy plate including vegetables, fruits, whole grains, and low-fat dairy products in a heart healthy meal plan.    Personal Goal #2  CBG concentrations in the normal range or as close to normal as is safely possible.      Intervention Plan   Intervention  Prescribe, educate and counsel regarding individualized specific dietary modifications aiming towards targeted core components such as weight, hypertension, lipid management, diabetes, heart failure and other comorbidities.    Expected Outcomes  Short Term Goal: Understand basic principles of dietary content, such as calories, fat, sodium, cholesterol and nutrients.       Nutrition Discharge: Nutrition Assessments - 09/18/19 1224      MEDFICTS Scores   Post Score  21       Education Questionnaire Score: Knowledge Questionnaire Score - 09/17/19 1648      Knowledge Questionnaire Score   Pre Score  21/24    Post Score  24/24       Goals reviewed with patient; copy given to patient.

## 2019-09-23 ENCOUNTER — Other Ambulatory Visit: Payer: Self-pay | Admitting: Cardiology

## 2019-09-24 ENCOUNTER — Telehealth: Payer: Self-pay

## 2019-09-24 NOTE — Telephone Encounter (Signed)
Returned call to patient who was given information from cardiac rehab. Finishing up his cardiac rehab and is interested in PREP. Explained program and intent to move towards lifestyle change. Will recall him when the next class date is set for intake.

## 2019-10-03 ENCOUNTER — Other Ambulatory Visit: Payer: Self-pay

## 2019-10-03 MED ORDER — POTASSIUM CHLORIDE CRYS ER 20 MEQ PO TBCR: 40.0000 meq | EXTENDED_RELEASE_TABLET | Freq: Two times a day (BID) | ORAL | 3 refills | Status: DC

## 2019-10-03 MED ORDER — HYDRALAZINE HCL 25 MG PO TABS: ORAL_TABLET | ORAL | 3 refills | Status: DC

## 2019-10-03 MED ORDER — FUROSEMIDE 20 MG PO TABS: ORAL_TABLET | ORAL | 3 refills | Status: DC

## 2019-10-03 MED ORDER — FUROSEMIDE 80 MG PO TABS: ORAL_TABLET | ORAL | 3 refills | Status: DC

## 2019-10-03 NOTE — Addendum Note (Signed)
Addended by: Demetrios Loll on: 10/03/2019 12:13 PM   Modules accepted: Orders

## 2019-10-06 ENCOUNTER — Other Ambulatory Visit: Payer: Self-pay

## 2019-10-06 ENCOUNTER — Ambulatory Visit: Payer: Medicare Other | Admitting: *Deleted

## 2019-10-06 DIAGNOSIS — Z5181 Encounter for therapeutic drug level monitoring: Secondary | ICD-10-CM | POA: Diagnosis not present

## 2019-10-06 DIAGNOSIS — I480 Paroxysmal atrial fibrillation: Secondary | ICD-10-CM

## 2019-10-06 LAB — POCT INR: INR: 2.9 (ref 2.0–3.0)

## 2019-10-06 NOTE — Patient Instructions (Addendum)
Description   Continue taking same dosage 1.5 tablets every day except 1 tablet on Tuesdays, Thursdays and Saturdays.  Recheck INR 5 weeks.  Coumadin clinic #[phone].     

## 2019-10-28 NOTE — Progress Notes (Signed)
[facility] PREP Progress Report   Patient Details  Name: Brandon Klein MRN: [id number] Date of Birth: 07-31-1940 Age: 79 y.o. PCP: Juluis Rainier, MD  Vitals:   10/28/19 1625  BP: 122/72  Pulse: 72  SpO2: 97%  Weight: 239 lb (108.4 kg)     [facility] Eval - 10/28/19 1600      Referral    Referring Provider  --   Cardiac Rehab    Reason for referral  Inactivity    Program Start Date  11/04/19      Measurement   Neck measurement  18 Inches    Waist Circumference  50.5 inches    Body fat  41.4 percent      Information for Trainer   Goals  exer 2-3x per week    Current Exercise  none    Orthopedic Concerns  Knee, shoulders     Pertinent Medical History  Pacer, AVR, CABG x 4, Prediabetes, AFIB, OSA     Current Barriers  none     Restrictions/Precautions  Assistive device    Medications that affect exercise  Beta blocker;Medication causing dizziness/drowsiness      Timed Up and Go (TUGS)   Timed Up and Go  --   walks with cane, high fall risk per chart      Mobility and Daily Activities   I find it easy to walk up or down two or more flights of stairs.  1    I have no trouble taking out the trash.  1    I do housework such as vacuuming and dusting on my own without difficulty.  4    I can easily lift a gallon of milk (8lbs).  4    I can easily walk a mile.  1    I have no trouble reaching into high cupboards or reaching down to pick up something from the floor.  3    I do not have trouble doing out-door work such as Loss adjuster, chartered, raking leaves, or gardening.  3      Mobility and Daily Activities   I feel younger than my age.  4    I feel independent.  4    I feel energetic.  2    I live an active life.   2    I feel strong.  2    I feel healthy.  4    I feel active as other people my age.  4      How fit and strong are you.   Fit and Strong Total Score  39      Past Medical History:  Diagnosis Date  . Chronic renal insufficiency   . Coronary  artery disease    a. s/p PCI of LAD 1997. b. Cutting balloon angioplasty to LCx in 2002. c. s/p cath 11/2013 with severe 3 vessel ASCAD s/p CABG with LIMA to LAD, SVG to diag, SVG to left circ and SVG to PDA. d. cath 07/07/2014 occluded SVG to PDA, all other grafts patent. 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 DJD (degenerative joint disease)   . Epistaxis   . GERD (gastroesophageal reflux disease)   . GI bleeding    a.  with benign gastric polypectomy 05/2014.  Marland Kitchen HLD (hyperlipidemia)   . HTN (hypertension)   . Obesity   . OSA (obstructive sleep apnea)   . Pacemaker    a. 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 b. MDT  generator replacement 2012.  Marland Kitchen PAF (paroxysmal atrial fibrillation) (HCC)    on chronic systemic anticoagulation  . PVC's (premature ventricular contractions)   . 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 Severe aortic stenosis   . Syncope    a. vasovagal with documented bradycardia.  . Type 2 diabetes mellitus without complications ([place]) 0/12/6759   Past Surgical History:  Procedure Laterality Date  . ANGIOPLASTY  1997  . CHOLECYSTECTOMY    . COLONOSCOPY WITH PROPOFOL N/A 05/05/2014   Procedure: COLONOSCOPY WITH PROPOFOL;  Surgeon: Garlan Fair, MD;  Location: [facility];  Service: Endoscopy;  Laterality: N/A;  . CORONARY ARTERY BYPASS GRAFT N/A 12/01/2013   Procedure: CORONARY ARTERY BYPASS GRAFT TIMES FOUR USING LEFT INTERNAL MAMMARY ARTERY TO LAD, SAPHENOUS VEIN GRAFTS TO DIAGONAL, CIRCUMFELX AND POSTERIOR DESCENDING;  Surgeon: Ivin Poot, MD;  Location: [place];  Service: Open Heart Surgery;  Laterality: N/A;  . CORONARY STENT INTERVENTION N/A 03/21/2019   Procedure: CORONARY STENT INTERVENTION;  Surgeon: Burnell Blanks, MD;  Location: [place] CV [facility];  Service: Cardiovascular;   Laterality: N/A;  Distal RCA  . ESOPHAGOGASTRODUODENOSCOPY (EGD) WITH PROPOFOL N/A 05/05/2014   Procedure: ESOPHAGOGASTRODUODENOSCOPY (EGD) WITH PROPOFOL;  Surgeon: Garlan Fair, MD;  Location: [facility];  Service: Endoscopy;  Laterality: N/A;  . KNEE ARTHROSCOPY  2000   Left  . LEFT AND RIGHT HEART CATHETERIZATION WITH CORONARY/GRAFT ANGIOGRAM N/A 07/07/2014   Procedure: LEFT AND RIGHT HEART CATHETERIZATION WITH Beatrix Fetters;  Surgeon: Burnell Blanks, MD;  Location: [facility] C[facility] LAB;  Service: Cardiovascular;  Laterality: N/A;  . LEFT HEART CATHETERIZATION WITH CORONARY ANGIOGRAM N/A 11/25/2013   Procedure: LEFT HEART CATHETERIZATION WITH CORONARY ANGIOGRAM;  Surgeon: Sueanne Margarita, MD;  Location: [facility] C[facility] LAB;  Service: Cardiovascular;  Laterality: N/A;  . PACEMAKER INSERTION  2002   generator change MDT by Dr Caryl Comes in 2012  . RIGHT/LEFT HEART CATH AND CORONARY/GRAFT ANGIOGRAPHY N/A 03/21/2019   Procedure: RIGHT/LEFT HEART CATH AND CORONARY/GRAFT ANGIOGRAPHY;  Surgeon: Burnell Blanks, MD;  Location: [place] [facility];  Service: Cardiovascular;  Laterality: N/A;  . TEE WITHOUT CARDIOVERSION N/A 08/31/2014   Procedure: TRANSESOPHAGEAL ECHOCARDIOGRAM (TEE);  Surgeon: Sueanne Margarita, MD;  Location: [facility] ENDOSCOPY;  Service: Cardiovascular;  Laterality: N/A;  . TEE WITHOUT CARDIOVERSION N/A 04/08/2019   Procedure: TRANSESOPHAGEAL ECHOCARDIOGRAM (TEE);  Surgeon: Burnell Blanks, MD;  Location: [place];  Service: Open Heart Surgery;  Laterality: N/A;  . TRANSCATHETER AORTIC VALVE REPLACEMENT, TRANSFEMORAL  04/08/2019  . TRANSCATHETER AORTIC VALVE REPLACEMENT, TRANSFEMORAL N/A 04/08/2019   Procedure: TRANSCATHETER AORTIC VALVE REPLACEMENT, TRANSFEMORAL;  Surgeon: Burnell Blanks, MD;  Location: [place];  Service: Open Heart Surgery;  Laterality: N/A;   Social History   Tobacco Use  Smoking Status Former Smoker  . Packs/day: 2.00  . Years: 5.00  . Pack years:  10.00  . Types: Cigarettes  . Quit date: 05/29/1966  . Years since quitting: 53.4  Smokeless Tobacco Never Used  Will begin PREP on 6/8 and will meet every Tues/thurs from 1-215pm  Barnett Hatter 10/28/2019, 4:30 PM

## 2019-11-04 ENCOUNTER — Ambulatory Visit (INDEPENDENT_AMBULATORY_CARE_PROVIDER_SITE_OTHER): Payer: Medicare Other | Admitting: *Deleted

## 2019-11-04 DIAGNOSIS — I442 Atrioventricular block, complete: Secondary | ICD-10-CM | POA: Diagnosis not present

## 2019-11-04 LAB — CUP PACEART REMOTE DEVICE CHECK
Battery Impedance: 1198 Ohm
Battery Remaining Longevity: 59 mo
Battery Voltage: 2.76 V
Brady Statistic AP VP Percent: 1 %
Brady Statistic AP VS Percent: 61 %
Brady Statistic AS VP Percent: 1 %
Brady Statistic AS VS Percent: 38 %
Date Time Interrogation Session: 20210608112613
Implantable Lead Implant Date: 20020926
Implantable Lead Implant Date: 20020926
Implantable Lead Location: 753859
Implantable Lead Location: 753860
Implantable Lead Model: 5076
Implantable Lead Model: 5092
Implantable Pulse Generator Implant Date: 20120905
Lead Channel Impedance Value: 1005 Ohm
Lead Channel Impedance Value: 511 Ohm
Lead Channel Pacing Threshold Amplitude: 0.625 V
Lead Channel Pacing Threshold Amplitude: 1 V
Lead Channel Pacing Threshold Pulse Width: 0.4 ms
Lead Channel Pacing Threshold Pulse Width: 0.4 ms
Lead Channel Setting Pacing Amplitude: 2 V
Lead Channel Setting Pacing Amplitude: 2.5 V
Lead Channel Setting Pacing Pulse Width: 0.4 ms
Lead Channel Setting Sensing Sensitivity: 4 mV

## 2019-11-05 NOTE — Progress Notes (Signed)
Remote pacemaker transmission.   

## 2019-11-10 ENCOUNTER — Other Ambulatory Visit: Payer: Self-pay

## 2019-11-10 ENCOUNTER — Ambulatory Visit: Payer: Medicare Other | Admitting: *Deleted

## 2019-11-10 DIAGNOSIS — I480 Paroxysmal atrial fibrillation: Secondary | ICD-10-CM | POA: Diagnosis not present

## 2019-11-10 DIAGNOSIS — Z5181 Encounter for therapeutic drug level monitoring: Secondary | ICD-10-CM

## 2019-11-10 LAB — POCT INR: INR: 2.8 (ref 2.0–3.0)

## 2019-11-10 NOTE — Patient Instructions (Signed)
Description   Continue taking same dosage 1.5 tablets every day except 1 tablet on Tuesdays, Thursdays and Saturdays.  Recheck INR 6 weeks.  Coumadin clinic [phone].

## 2019-11-11 NOTE — Progress Notes (Signed)
[facility] PREP Weekly Session   Patient Details  Name: Brandon Klein MRN: [id number] Date of Birth: 1940/11/15 Age: 79 y.o. PCP: Juluis Rainier, MD  Vitals:   11/11/19 1610  Weight: 245 lb (111.1 kg)     [facility] Weekly seesion - 11/11/19 1600      Weekly Session   Topic Discussed Importance of resistance training;Other ways to be active    Minutes exercised this week 75 minutes    Classes attended to date 3          Fun things since last meeting: sit on porch and watched rain Grateful for: warm weather and sports on TV Nutrition celebration: $2.99 cherrios at [employer] (usually $6.99) really good Barriers/struggles: knees-always the knees, so far so good. Reviewing sodium consumption   Bonnye Fava 11/11/2019, 4:11 PM

## 2019-11-18 NOTE — Progress Notes (Signed)
[facility] PREP Weekly Session   Patient Details  Name: Brandon Klein MRN: [id number] Date of Birth: 1940-11-03 Age: 79 y.o. PCP: Juluis Rainier, MD  Vitals:   11/18/19 1448  Weight: 244 lb 9.6 oz (110.9 kg)     [facility] Weekly seesion - 11/18/19 1400      Weekly Session   Topic Discussed Healthy eating tips    Minutes exercised this week 190 minutes    Classes attended to date 5         Sugar brochure given  Fun things since last meeting: watched one of the best [place] opens Grateful for: had friends over for dinner first time since start of Covid 19 Nutrition celebration: Think I did at least one day of <1500 mgs of Sodium Barriers/struggles: Nothing now , knees so far so good    Bonnye Fava 11/18/2019, 2:49 PM

## 2019-11-25 NOTE — Progress Notes (Signed)
Attended PREP Class Fun things since last meeting: golf, hockey, racing on TV Grateful for : warm weather Nutrition celebration: went to [employer] and go a medium cone instead of a large Barriers/struggles: did 4 days in a row--too much--need to pace myself better

## 2019-12-02 NOTE — Progress Notes (Signed)
[facility] PREP Weekly Session   Patient Details  Name: Brandon Klein MRN: [id number] Date of Birth: 1941-04-08 Age: 79 y.o. PCP: Juluis Rainier, MD  Vitals:   12/02/19 1651  Weight: 243 lb 9.6 oz (110.5 kg)     [facility] Weekly seesion - 12/02/19 1600      Weekly Session   Topic Discussed Restaurant Eating    Minutes exercised this week 180 minutes    Classes attended to date 66          Fun things since last meeting: out to dinner with friend, good weather Nutrition celebration: don't think I have a cookie since this started Barriers/struggles: major dental coming need to work in schedule    Bonnye Fava 12/02/2019, 4:52 PM

## 2019-12-09 NOTE — Progress Notes (Signed)
[facility] PREP Weekly Session   Patient Details  Name: Brandon Klein MRN: [id number] Date of Birth: Aug 18, 1940 Age: 79 y.o. PCP: Juluis Rainier, MD  Vitals:   12/09/19 1643  Weight: 243 lb 3.2 oz (110.3 kg)    Fun Things since last  Meeting: had 19 year old grandson over weekend Nutrition celebration: ate quite a bit of fruit Barriers/struggles: same old stuff knees and teeth   Brandon Klein 12/09/2019, 4:44 PM

## 2019-12-16 NOTE — Progress Notes (Signed)
[facility] PREP Weekly Session   Patient Details  Name: Brandon Klein MRN: [id number] Date of Birth: Jul 12, 1940 Age: 79 y.o. PCP: Juluis Rainier, MD  There were no vitals filed for this visit.   [facility] Weekly seesion - 12/16/19 1500      Weekly Session   Topic Discussed Expectations and non-scale victories    Minutes exercised this week 150 minutes    Classes attended to date 60          Fun things since last meeting: golf, racing on TV Grateful for: 17 year old grandson was so much fun  Nutrition celebration: have continue to cut out most desserts Barriers/struggles: bad knees, dental, hurt back, same old stuff   Bonnye Fava 12/16/2019, 3:50 PM

## 2019-12-22 ENCOUNTER — Ambulatory Visit: Payer: Medicare Other | Admitting: *Deleted

## 2019-12-22 ENCOUNTER — Other Ambulatory Visit: Payer: Self-pay

## 2019-12-22 DIAGNOSIS — Z5181 Encounter for therapeutic drug level monitoring: Secondary | ICD-10-CM | POA: Diagnosis not present

## 2019-12-22 DIAGNOSIS — I480 Paroxysmal atrial fibrillation: Secondary | ICD-10-CM | POA: Diagnosis not present

## 2019-12-22 LAB — POCT INR: INR: 2.1 (ref 2.0–3.0)

## 2019-12-22 NOTE — Patient Instructions (Addendum)
Description   Continue taking Warfarin 1.5 tablets every day except 1 tablet on Tuesdays, Thursdays and Saturdays.  Recheck INR 7 weeks.  Coumadin clinic [phone].

## 2019-12-23 NOTE — Progress Notes (Signed)
[facility] PREP Weekly Session   Patient Details  Name: MAXXIMUS GOTAY MRN: [id number] Date of Birth: 11/28/1940 Age: 79 y.o. PCP: Juluis Rainier, MD  Vitals:   12/23/19 1624  Weight: (!) 241 lb 12.8 oz (109.7 kg)     [facility] Weekly seesion - 12/23/19 1600      Weekly Session   Topic Discussed --   portion control   Minutes exercised this week 95 minutes    Classes attended to date 80          Fun things since last meeting: dinner Birthday at [facility] for: kids, grandkids calling by video for birthday nurtrition celebration: probably bad a <1500 mg Na day this week Barriers/struggles: back and knees    Bonnye Fava 12/23/2019, 4:24 PM

## 2019-12-30 NOTE — Progress Notes (Signed)
[facility] PREP Weekly Session   Patient Details  Name: Brandon Klein MRN: [id number] Date of Birth: June 23, 1940 Age: 79 y.o. PCP: Juluis Rainier, MD  Vitals:   12/30/19 1606  Weight: 241 lb 6.4 oz (109.5 kg)     [facility] Weekly seesion - 12/30/19 1600      Weekly Session   Topic Discussed Finding support    Minutes exercised this week 80 minutes    Classes attended to date 53          Fun things since last meeting: 25 year old grandson visit Grateful for: see above and going on vacation Nutrition celebration: found substitute for orange juice for breakfast, 10 cal, no fat, 10 mg sodium, 2 gm sugar Barriers/struggles: back, knees, foot, ear. Everything hurts.   Bonnye Fava 12/30/2019, 4:07 PM

## 2020-01-29 NOTE — Progress Notes (Signed)
[facility] PREP Progress Report   Patient Details  Name: Brandon Klein MRN: [id number] Date of Birth: 05/22/1941 Age: 79 y.o. PCP: Juluis Rainier, MD  Vitals:   01/29/20 1403  BP: 110/70  Pulse: 68  Weight: 235 lb 3.2 oz (106.7 kg)      [facility] Eval - 01/29/20 1400      Information for Trainer   Goals longer times on cardio machines, more reps and wt/strength training       Mobility and Daily Activities   I find it easy to walk up or down two or more flights of stairs. 1    I have no trouble taking out the trash. 3    I do housework such as vacuuming and dusting on my own without difficulty. 2    I can easily lift a gallon of milk (8lbs). 4    I can easily walk a mile. 1    I have no trouble reaching into high cupboards or reaching down to pick up something from the floor. 3    I do not have trouble doing out-door work such as Loss adjuster, chartered, raking leaves, or gardening. 1      Mobility and Daily Activities   I feel younger than my age. 4    I feel independent. 4    I feel energetic. 2    I live an active life.  2    I feel strong. 3    I feel healthy. 3    I feel active as other people my age. 2      How fit and strong are you.   Fit and Strong Total Score 35          Past Medical History:  Diagnosis Date  . Chronic renal insufficiency   . Coronary artery disease    a. s/p PCI of LAD 1997. b. Cutting balloon angioplasty to LCx in 2002. c. s/p cath 11/2013 with severe 3 vessel ASCAD s/p CABG with LIMA to LAD, SVG to diag, SVG to left circ and SVG to PDA. d. cath 07/07/2014 occluded SVG to PDA, all other grafts patent. 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 DJD (degenerative joint disease)   . Epistaxis   . GERD (gastroesophageal reflux disease)   . GI bleeding    a.  with benign gastric polypectomy 05/2014.  Marland Kitchen HLD (hyperlipidemia)   . HTN (hypertension)   . Obesity   . OSA (obstructive sleep apnea)   . Pacemaker    a. s/p pacemaker in 2002 for vasovagal syncope  with bradycardia. b. MDT generator replacement 2012.  Marland Kitchen PAF (paroxysmal atrial fibrillation) (HCC)    on chronic systemic anticoagulation  . PVC's (premature ventricular contractions)   . 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 Severe aortic stenosis   . Syncope    a. vasovagal with documented bradycardia.  . Type 2 diabetes mellitus without complications (HCC) 12/02/2015   Past Surgical History:  Procedure Laterality Date  . ANGIOPLASTY  1997  . CHOLECYSTECTOMY    . COLONOSCOPY WITH PROPOFOL N/A 05/05/2014   Procedure: COLONOSCOPY WITH PROPOFOL;  Surgeon: Charolett Bumpers, MD;  Location: [facility] ENDOSCOPY;  Service: Endoscopy;  Laterality: N/A;  . CORONARY ARTERY BYPASS GRAFT N/A 12/01/2013  Procedure: CORONARY ARTERY BYPASS GRAFT TIMES FOUR USING LEFT INTERNAL MAMMARY ARTERY TO LAD, SAPHENOUS VEIN GRAFTS TO DIAGONAL, CIRCUMFELX AND POSTERIOR DESCENDING;  Surgeon: Kerin Perna, MD;  Location: [facility] OR;  Service: Open Heart Surgery;  Laterality: N/A;  . CORONARY STENT INTERVENTION N/A 03/21/2019   Procedure: CORONARY STENT INTERVENTION;  Surgeon: Kathleene Hazel, MD;  Location: [facility] INVASIVE CV LAB;  Service: Cardiovascular;  Laterality: N/A;  Distal RCA  . ESOPHAGOGASTRODUODENOSCOPY (EGD) WITH PROPOFOL N/A 05/05/2014   Procedure: ESOPHAGOGASTRODUODENOSCOPY (EGD) WITH PROPOFOL;  Surgeon: Charolett Bumpers, MD;  Location: [facility] ENDOSCOPY;  Service: Endoscopy;  Laterality: N/A;  . KNEE ARTHROSCOPY  2000   Left  . LEFT AND RIGHT HEART CATHETERIZATION WITH CORONARY/GRAFT ANGIOGRAM N/A 07/07/2014   Procedure: LEFT AND RIGHT HEART CATHETERIZATION WITH Isabel Caprice;  Surgeon: Kathleene Hazel, MD;  Location: [facility] CATH LAB;  Service: Cardiovascular;  Laterality: N/A;  . LEFT HEART CATHETERIZATION WITH CORONARY ANGIOGRAM N/A  11/25/2013   Procedure: LEFT HEART CATHETERIZATION WITH CORONARY ANGIOGRAM;  Surgeon: [name], MD;  Location: [facility] CATH LAB;  Service: Cardiovascular;  Laterality: N/A;  . PACEMAKER INSERTION  2002   generator change MDT by Dr Graciela Husbands in 2012  . RIGHT/LEFT HEART CATH AND CORONARY/GRAFT ANGIOGRAPHY N/A 03/21/2019   Procedure: RIGHT/LEFT HEART CATH AND CORONARY/GRAFT ANGIOGRAPHY;  Surgeon: Kathleene Hazel, MD;  Location: [facility] INVASIVE CV LAB;  Service: Cardiovascular;  Laterality: N/A;  . TEE WITHOUT CARDIOVERSION N/A 08/31/2014   Procedure: TRANSESOPHAGEAL ECHOCARDIOGRAM (TEE);  Surgeon: [name], MD;  Location: [facility] ENDOSCOPY;  Service: Cardiovascular;  Laterality: N/A;  . TEE WITHOUT CARDIOVERSION N/A 04/08/2019   Procedure: TRANSESOPHAGEAL ECHOCARDIOGRAM (TEE);  Surgeon: Kathleene Hazel, MD;  Location: [facility] OR;  Service: Open Heart Surgery;  Laterality: N/A;  . TRANSCATHETER AORTIC VALVE REPLACEMENT, TRANSFEMORAL  04/08/2019  . TRANSCATHETER AORTIC VALVE REPLACEMENT, TRANSFEMORAL N/A 04/08/2019   Procedure: TRANSCATHETER AORTIC VALVE REPLACEMENT, TRANSFEMORAL;  Surgeon: Kathleene Hazel, MD;  Location: [facility] OR;  Service: Open Heart Surgery;  Laterality: N/A;   Social History   Tobacco Use  Smoking Status Former Smoker  . Packs/day: 2.00  . Years: 5.00  . Pack years: 10.00  . Types: Cigarettes  . Quit date: 05/29/1966  . Years since quitting: 53.7  Smokeless Tobacco Never Used     Last day of PREP 01/29/20 Improved in cardio, strength and in balance    Pam M Huan Pollok 01/29/2020, 2:06 PM

## 2020-02-03 ENCOUNTER — Ambulatory Visit (INDEPENDENT_AMBULATORY_CARE_PROVIDER_SITE_OTHER): Payer: Medicare Other | Admitting: *Deleted

## 2020-02-03 DIAGNOSIS — I442 Atrioventricular block, complete: Secondary | ICD-10-CM | POA: Diagnosis not present

## 2020-02-04 LAB — CUP PACEART REMOTE DEVICE CHECK
Battery Impedance: 1278 Ohm
Battery Remaining Longevity: 56 mo
Battery Voltage: 2.76 V
Brady Statistic AP VP Percent: 1 %
Brady Statistic AP VS Percent: 66 %
Brady Statistic AS VP Percent: 1 %
Brady Statistic AS VS Percent: 32 %
Date Time Interrogation Session: 20210907111732
Implantable Lead Implant Date: 20020926
Implantable Lead Implant Date: 20020926
Implantable Lead Location: 753859
Implantable Lead Location: 753860
Implantable Lead Model: 5076
Implantable Lead Model: 5092
Implantable Pulse Generator Implant Date: 20120905
Lead Channel Impedance Value: 504 Ohm
Lead Channel Impedance Value: 965 Ohm
Lead Channel Pacing Threshold Amplitude: 0.75 V
Lead Channel Pacing Threshold Amplitude: 1.125 V
Lead Channel Pacing Threshold Pulse Width: 0.4 ms
Lead Channel Pacing Threshold Pulse Width: 0.4 ms
Lead Channel Setting Pacing Amplitude: 2 V
Lead Channel Setting Pacing Amplitude: 2.5 V
Lead Channel Setting Pacing Pulse Width: 0.4 ms
Lead Channel Setting Sensing Sensitivity: 4 mV

## 2020-02-04 NOTE — Progress Notes (Signed)
Remote pacemaker transmission.   

## 2020-02-10 NOTE — Progress Notes (Signed)
[facility]   MULTIDISCIPLINARY HEART VALVE CLINIC                                       Cardiology Office Note    Date:  02/11/2020   ID:  Brandon Klein, DOB May 29, 1941, MRN [id number]  PCP:  Juluis Rainier, MD  Cardiologist: Armanda Magic, MD / Dr. Clifton Arley & Dr. Cornelius Moras (TAVR)  CC: 1 year s/p TAVR  History of Present Illness:  Brandon Klein is a 79 y.o. male with a history of carotid artery disease, CAD s/p 4V CABG (2015 PVT) s/p PCI to dRCA (10/23), chronic systolic CHF, NICM, HTN, HLD, PAF on coumadin, CKD stage II,sleep apnea,bradycardias/p PPM and severe AS s/p TAVR (04/08/19) who presents to clinic for follow up.   He has a hx of CAD and underwent 4V CABG in 2015. He has had a pacemaker implanted remotely for bradycardia and syncope. He has paroxysmal atrial fibrillation and is on coumadin. His aortic stenosis has been followed for several years. Echo 01/31/19 showed LVEF=45-50% with severe AS. TAVR workup delayed while he completed his planned cataract surgery. Cardiac cath 03/21/19 with 3/4 patent bypass grafts. The SVG to the PDA is known to be occluded. A drug eluting stent was placed in the distal RCA.   He underwent successful TAVR with a 29 mm Edwards Sapien 3 THV via the TF approach on 04/08/19. Post operative echo showed EF 50-55% with normally functioning TAVR with a mean gradient of 12 mm Hg and no PVL. He has done well in follow up. 1 month follow up echo showed EF 40-45%, normally functioning TAVR with mean gradient of 11 mm hg and no PVL.  Today he presents to clinic for follow up. No CP or SOB. No LE edema, orthopnea or PND. No dizziness or syncope. No blood in stool or urine. No palpitations. Mostly limited by knee pain. Is vaccinated by [employer]. Worked out with the [employer] program at [employer]. Still exercising with silver sneakers and on his own. Goes 4 days a week. Lost 12 lbs.   Past Medical History:  Diagnosis Date  . Chronic renal insufficiency   .  Coronary artery disease    a. s/p PCI of LAD 1997. b. Cutting balloon angioplasty to LCx in 2002. c. s/p cath 11/2013 with severe 3 vessel ASCAD s/p CABG with LIMA to LAD, SVG to diag, SVG to left circ and SVG to PDA. d. cath 07/07/2014 occluded SVG to PDA, all other grafts patent. 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 DJD (degenerative joint disease)   . Epistaxis   . GERD (gastroesophageal reflux disease)   . GI bleeding    a.  with benign gastric polypectomy 05/2014.  Marland Kitchen HLD (hyperlipidemia)   . HTN (hypertension)   . Obesity   . OSA (obstructive sleep apnea)   . Pacemaker    a. 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 b. MDT generator replacement 2012.  Marland Kitchen PAF (paroxysmal atrial fibrillation) (HCC)    on chronic systemic anticoagulation  . PVC's (premature ventricular contractions)   . 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 Severe aortic stenosis   .  Syncope    a. vasovagal with documented bradycardia.  . Type 2 diabetes mellitus without complications (HCC) 12/02/2015    Past Surgical History:  Procedure Laterality Date  . ANGIOPLASTY  1997  . CHOLECYSTECTOMY    . COLONOSCOPY WITH PROPOFOL N/A 05/05/2014   Procedure: COLONOSCOPY WITH PROPOFOL;  Surgeon: Charolett Bumpers, MD;  Location: [facility] ENDOSCOPY;  Service: Endoscopy;  Laterality: N/A;  . CORONARY ARTERY BYPASS GRAFT N/A 12/01/2013   Procedure: CORONARY ARTERY BYPASS GRAFT TIMES FOUR USING LEFT INTERNAL MAMMARY ARTERY TO LAD, SAPHENOUS VEIN GRAFTS TO DIAGONAL, CIRCUMFELX AND POSTERIOR DESCENDING;  Surgeon: Kerin Perna, MD;  Location: [facility] OR;  Service: Open Heart Surgery;  Laterality: N/A;  . CORONARY STENT INTERVENTION N/A 03/21/2019   Procedure: CORONARY STENT INTERVENTION;  Surgeon: Kathleene Hazel, MD;  Location: [facility] INVASIVE CV LAB;  Service:  Cardiovascular;  Laterality: N/A;  Distal RCA  . ESOPHAGOGASTRODUODENOSCOPY (EGD) WITH PROPOFOL N/A 05/05/2014   Procedure: ESOPHAGOGASTRODUODENOSCOPY (EGD) WITH PROPOFOL;  Surgeon: Charolett Bumpers, MD;  Location: [facility] END[facility];  Service: Endoscopy;  Laterality: N/A;  . KNEE ARTHROSCOPY  2000   Left  . LEFT AND RIGHT HEART CATHETERIZATION WITH CORONARY/GRAFT ANGIOGRAM N/A 07/07/2014   Procedure: LEFT AND RIGHT HEART CATHETERIZATION WITH Isabel Caprice;  Surgeon: Kathleene Hazel, MD;  Location: [facility] CATH LAB;  Service: Cardiovascular;  Laterality: N/A;  . LEFT HEART CATHETERIZATION WITH CORONARY ANGIOGRAM N/A 11/25/2013   Procedure: LEFT HEART CATHETERIZATION WITH CORONARY ANGIOGRAM;  Surgeon: [name], MD;  Location: [facility] CATH LAB;  Service: Cardiovascular;  Laterality: N/A;  . PACEMAKER INSERTION  2002   generator change MDT by Dr Graciela Husbands in 2012  . RIGHT/LEFT HEART CATH AND CORONARY/GRAFT ANGIOGRAPHY N/A 03/21/2019   Procedure: RIGHT/LEFT HEART CATH AND CORONARY/GRAFT ANGIOGRAPHY;  Surgeon: Kathleene Hazel, MD;  Location: [facility] INVAS[facility] CV LAB;  Service: Cardiovascular;  Laterality: N/A;  . TEE WITHOUT CARDIOVERSION N/A 08/31/2014   Procedure: TRANSESOPHAGEAL ECHOCARDIOGRAM (TEE);  Surgeon: [name], MD;  Location: [facility] ENDOSCOPY;  Service: Cardiovascular;  Laterality: N/A;  . TEE WITHOUT CARDIOVERSION N/A 04/08/2019   Procedure: TRANSESOPHAGEAL ECHOCARDIOGRAM (TEE);  Surgeon: Kathleene Hazel, MD;  Location: [facility] OR;  Service: Open Heart Surgery;  Laterality: N/A;  . TRANSCATHETER AORTIC VALVE REPLACEMENT, TRANSFEMORAL  04/08/2019  . TRANSCATHETER AORTIC VALVE REPLACEMENT, TRANSFEMORAL N/A 04/08/2019   Procedure: TRANSCATHETER AORTIC VALVE REPLACEMENT, TRANSFEMORAL;  Surgeon: Kathleene Hazel, MD;  Location: [facility] OR;  Service: Open Heart Surgery;  Laterality: N/A;    Current Medications: Outpatient Medications Prior to Visit  Medication Sig Dispense Refill  .  Acetaminophen (TYLENOL ARTHRITIS PAIN PO) Take 1,300 mg by mouth 3 (three) times daily as needed.    Marland Kitchen aspirin 81 MG EC tablet Take 81 mg by mouth daily. Swallow whole.    Marland Kitchen atorvastatin (LIPITOR) 80 MG tablet TAKE 1 TABLET BY MOUTH  DAILY 90 tablet 2  . carvedilol (COREG) 25 MG tablet TAKE 1 TABLET(25 MG) BY MOUTH TWICE DAILY WITH A MEAL 180 tablet 2  . clindamycin (CLEOCIN) 300 MG capsule Take 2 pills (600 mg) 2 hours before your dental procedure 12 capsule 0  . docusate sodium (COLACE) 100 MG capsule Take 200 mg by mouth daily as needed for mild constipation.     . fexofenadine (ALLEGRA) 180 MG tablet Take 180 mg by mouth daily.     . fluticasone (FLONASE) 50 MCG/ACT nasal spray Place 2 sprays into the nose daily.     . furosemide (LASIX) 20  MG tablet TAKE 1 TABLET BY MOUTH TWICE DAILY. WITH 80 MG TO TOTAL 100MG  180 tablet 3  . furosemide (LASIX) 80 MG tablet TAKE 1 TABLET BY MOUTH TWICE DAILY. TAKE WITH 20 MG TABLET 180 tablet 3  . Glucosamine-Chondroitin 500-400 MG CAPS Take 1 tablet by mouth daily.    . hydrALAZINE (APRESOLINE) 25 MG tablet TAKE 1 TABLET(25 MG) BY MOUTH THREE TIMES DAILY 270 tablet 3  . isosorbide mononitrate (IMDUR) 30 MG 24 hr tablet Take 1 tablet (30 mg total) by mouth daily. 90 tablet 2  . L-Lysine 1000 MG TABS Take 1,000 mg by mouth daily.     . Magnesium Oxide 250 MG TABS Take 1 tablet by mouth daily.    . meclizine (ANTIVERT) 25 MG tablet Take 25 mg by mouth daily as needed for dizziness.     . Multiple Vitamin (MULTIVITAMIN) tablet Take 1 tablet by mouth daily. Centrum silver    . nitroGLYCERIN (NITROSTAT) 0.4 MG SL tablet Place 1 tablet (0.4 mg total) under the tongue every 5 (five) minutes as needed for chest pain. 30 tablet 3  . ONE TOUCH ULTRA TEST test strip 1 each by Other route every morning.     DELICA LANCETS FINE MISC 1 each by Other route every morning.     . pantoprazole (PROTONIX) 40 MG tablet Take 40 mg by mouth daily.     . potassium  chloride SA (KLOR-CON) 20 MEQ tablet Take 2 tablets (40 mEq total) by mouth 2 (two) times daily. 360 tablet 3  . RA NATURAL MAGNESIUM 250 MG TABS TAKE 2 TABLETS TWICE DAILY. 120 tablet 5  . spironolactone (ALDACTONE) 25 MG tablet TAKE 1 TABLET(25 MG) BY MOUTH DAILY 90 tablet 2  . warfarin (COUMADIN) 5 MG tablet Take 1 to 1.5 tablets daily as directed by Coumadin clinic 130 tablet 1  . acetaminophen (TYLENOL) 500 MG tablet Take 1,000 mg by mouth daily as needed.  (Patient not taking: Reported on 02/11/2020)    . Glucosamine-Chondroit-Vit C-Mn (GLUCOSAMINE CHONDR 1500 COMPLX) CAPS Take 1 capsule by mouth every morning.      No facility-administered medications prior to visit.     Allergies:   Acetaminophen-codeine, Vascepa [icosapent ethyl], Amoxicillin, and Diovan [valsartan]   Social History   Socioeconomic History  . Marital status: Married    Spouse name: Not on file  . Number of children: Not on file  . Years of education: Not on file  . Highest education level: Not on file  Occupational History  . Not on file  Tobacco Use  . Smoking status: Former Smoker    Packs/day: 2.00    Years: 5.00    Pack years: 10.00    Types: Cigarettes    Quit date: 05/29/1966    Years since quitting: 53.7  . Smokeless tobacco: Never Used  Vaping Use  . Vaping Use: Never used  Substance and Sexual Activity  . Alcohol use: Yes    Alcohol/week: 0.0 standard drinks    Comment: ocassional  . Drug use: No  . Sexual activity: Not on file  Other Topics Concern  . Not on file  Social History Narrative   Lives in [place] with spouse.  Retired [employer]   Social Determinants of Medical illustrator Strain:   . Difficulty of Paying Living Expenses: Not on file  Food Insecurity:   . Worried About Corporate investment banker in the Last Year: Not on file  . Ran Out of  Food in the Last Year: Not on file  Transportation Needs:   . Lack of Transportation (Medical): Not on file  . Lack of  Transportation (Non-Medical): Not on file  Physical Activity:   . Days of Exercise per Week: Not on file  . Minutes of Exercise per Session: Not on file  Stress:   . Feeling of Stress : Not on file  Social Connections:   . Frequency of Communication with Friends and Family: Not on file  . Frequency of Social Gatherings with Friends and Family: Not on file  . Attends Religious Services: Not on file  . Active Member of Clubs or Organizations: Not on file  . Attends Banker Meetings: Not on file  . Marital Status: Not on file     Family History:  The patient's family history includes Colon cancer in his mother; Heart attack in his brother and mother; Leukemia in his father.     ROS:   Please see the history of present illness.    ROS All other systems reviewed and are negative.   PHYSICAL EXAM:   VS:  BP 104/68   Pulse 78   Ht  (1.702 m)   Wt 233 lb (105.7 kg)   SpO2 97%   BMI 36.49 kg/m    GEN: Well nourished, well developed, in no acute distress HEENT: normal Neck: no JVD or masses Cardiac: RRR; no murmurs, rubs, or gallops,no edema  Respiratory:  clear to auscultation bilaterally, normal work of breathing GI: soft, nontender, nondistended, + BS MS: no deformity or atrophy Skin: warm and dry, no rash Neuro:  Alert and Oriented x 3, Strength and sensation are intact Psych: euthymic mood, full affect   Wt Readings from Last 3 Encounters:  02/11/20 233 lb (105.7 kg)  01/29/20 235 lb 3.2 oz (106.7 kg)  12/30/19 241 lb 6.4 oz (109.5 kg)      Studies/Labs Reviewed:   EKG:  EKG is NOT ordered today.    Recent Labs: 04/04/2019: B Natriuretic Peptide 70.7 04/09/2019: BUN 22; Creatinine, Ser 1.26; Hemoglobin 14.4; Magnesium 2.1; Platelets 114; Potassium 4.1; Sodium 136 07/07/2019: ALT 20   Lipid Panel    Component Value Date/Time   CHOL 129 07/07/2019 1053   CHOL 113 07/22/2014 1226   TRIG 173 (H) 07/07/2019 1053   TRIG 91 07/22/2014 1226   HDL 44  07/07/2019 1053   HDL 40 07/22/2014 1226   CHOLHDL 2.9 07/07/2019 1053   CHOLHDL 3.4 06/04/2015 1216   VLDL 39 (H) 06/04/2015 1216   LDLCALC 56 07/07/2019 1053   LDLCALC 55 07/22/2014 1226   LDLDIRECT 47.9 07/14/2013 0937    Additional studies/ records that were reviewed today include:  TAVR OPERATIVE NOTE   Date of Procedure:04/08/2019  Preoperative Diagnosis:Severe Aortic Stenosis   Postoperative Diagnosis:Same   Procedure:   Transcatheter Aortic Valve Replacement - PercutaneousRightTransfemoral Approach Edwards Sapien 3 THV (size 29mm, model # 9600TFX, serial [id number])  Co-Surgeons:Clarence H. Cornelius Moras, MD and Verne Carrow, MD  Anesthesiologist:Charlene Chilton Si, MD  Echocardiographer:Peter Eden Emms, MD  Pre-operative Echo Findings: ? Severe aortic stenosis ? Normalleft ventricular systolic function  Post-operative Echo Findings: ? Noparavalvular leak ? Normalleft ventricular systolic function  _____________    Echo 04/09/19: IMPRESSIONS  1. Left ventricular ejection fraction, by visual estimation, is 50 to 55%. The left ventricle has normal function. Left ventricular septal wall thickness was mildly increased. Mildly increased left ventricular posterior wall thickness. There is mildly  increased left ventricular hypertrophy.  2.  Left ventricular diastolic parameters are consistent with Grade I diastolic dysfunction (impaired relaxation).  3. Global right ventricle has normal systolic function.The right ventricular size is normal. No increase in right ventricular wall thickness.  4. Left atrial size was mildly dilated.  5. Right atrial size was mildly dilated.  6. The mitral valve is normal in structure. Trace mitral valve regurgitation. No evidence of mitral stenosis.  7. The tricuspid valve is normal in structure. Tricuspid valve  regurgitation is trivial.  8. Aortic valve regurgitation is not visualized. Mild aortic valve stenosis.  9. The pulmonic valve was normal in structure. Pulmonic valve regurgitation is not visualized. 10. Normal pulmonary artery systolic pressure. 11. The inferior vena cava is normal in size with greater than 50% respiratory variability, suggesting right atrial pressure of 3 mmHg.  Aortic Valve: The aortic valve has been repaired/replaced. Aortic valve regurgitation is not visualized. Mild aortic stenosis is present. Aortic valve mean gradient measures 12.0 mmHg. Aortic valve peak gradient measures 24.2 mmHg. Aortic valve area, by  VTI measures 2.44 cm. 29mm Edwards Sapien bioprosthetic, stented aortic valve (TAVR) valve is present in the aortic position.  _____________    Echo 05/09/19 IMPRESSIONS  1. Left ventricular ejection fraction, by visual estimation, is 40 to 45%. The left ventricle has mild to moderately decreased function. There is mildly increased left ventricular hypertrophy.  2. Left ventricular diastolic parameters are consistent with Grade I diastolic dysfunction (impaired relaxation).  3. The left ventricle demonstrates global hypokinesis.  4. Global right ventricle has normal systolic function.The right ventricular size is mildly enlarged. No increase in right ventricular wall thickness.  5. Left atrial size was moderately dilated.  6. Right atrial size was moderately dilated.  7. The mitral valve is normal in structure. Mild mitral valve regurgitation. No evidence of mitral stenosis.  8. The tricuspid valve is normal in structure. Tricuspid valve regurgitation is mild.  9. Aortic valve regurgitation is not visualized. No evidence of aortic valve sclerosis or stenosis. 10. The pulmonic valve was normal in structure. Pulmonic valve regurgitation is not visualized. 11. Normal pulmonary artery systolic pressure. 12. The tricuspid regurgitant velocity is 1.98 m/s, and with an  assumed right atrial pressure of 3 mmHg, the estimated right ventricular systolic pressure is normal at 18.7 mmHg. 13. A pacer wire is visualized in the RV and RA. 14. The inferior vena cava is normal in size with greater than 50% respiratory variability, suggesting right atrial pressure of 3 mmHg. 15. Normal transaortic gradients peak/mean 19/11 mmHg, trivial paravalvular leak. LVEF has decreased now 40-45% with diffuse hypokinesis.  _______________  Echo 02/11/20  IMPRESSIONS    1. Left ventricular ejection fraction, by estimation, is 40 to 45%. The  left ventricle has mildly decreased function. The left ventricle  demonstrates global hypokinesis. Left ventricular diastolic parameters are  consistent with Grade I diastolic  dysfunction (impaired relaxation).  2. Right ventricular systolic function is mildly reduced. The right  ventricular size is normal.  3. The mitral valve is abnormal. Trivial mitral valve regurgitation.  4. The aortic valve has been repaired/replaced. Aortic valve  regurgitation is trivial. There is a 29 mm Sapien prosthetic (TAVR) valve  present in the aortic position. Echo findings are consistent with  perivalvular leak of the aortic prosthesis. Aortic  valve mean gradient measures 8.2 mmHg. Aortic valve Vmax measures 1.77  m/s. Peak gradient 12.5 mmHg. DI 0.41.  5. Aortic dilatation noted. There is borderline dilatation of the  ascending aorta, measuring 38 mm.  6. The inferior vena cava is normal in size with greater than 50%  respiratory variability, suggesting right atrial pressure of 3 mmHg.   Comparison(s): Changes from prior study are noted. 05/15/2019: LVEF  40-45%, mild LVH, grade 1 DD, 29 mm Edwards Sapien TAVR - no AR, mean  gradient 11 mmHg.   ASSESSMENT & PLAN:   Severe AS s/p TAVR: echo today shows EF 40-45%, normally functioning TAVR with mean gradient of 11 mm hg and no PVL. He has NYHA class I symptoms, but his mobility is mostly  limited by knee pain. SBE prophylaxis discussed; he has clindamycin. I have asked him to take azithromycin instead of clinda as this is not recommended any longer because of increased risk of C Dif. Continue on coumadin and aspirin.   CAD: pre TAVR cath showed severe triple vessel CAD s/p 4V CABG with 3/4 patent bypass grafts. There was severe stenosis distal RCA s/p successful PTCA/DES x 1 distal RCA. Continue medical therapy. He was treated with triple therapy x 1 month and dropped aspirin on 04/21/19. He was treated with plavix and coumadin x 6 months. After 6 months, plavix was converted to a baby aspirin.   PAF: sounds regular on exam. Continue on coumadin.   Chronic combined S/D CHF: EF 40-45% today. Continue spiro and lasix.  HTN: Bp well controlled today. No changes made.   Medication Adjustments/Labs and Tests Ordered: Current medicines are reviewed at length with the patient today.  Concerns regarding medicines are outlined above.  Medication changes, Labs and Tests ordered today are listed in the Patient Instructions below. Patient Instructions  Medication Instructions:  Your provider recommends that you continue on your current medications as directed. Please refer to the Current Medication list given to you today.   *If you need a refill on your cardiac medications before your next appointment, please call your pharmacy*   Follow-Up: Your provider recommends that you schedule a follow-up appointment in 6 months with Dr. [name].    Signed, Cline Crock, PA-C  02/11/2020 2:47 PM    [facility] [address] [place], [place], [place]  [address] Phone: [phone]; Fax: [phone]

## 2020-02-11 ENCOUNTER — Other Ambulatory Visit: Payer: Self-pay | Admitting: Physician Assistant

## 2020-02-11 ENCOUNTER — Other Ambulatory Visit: Payer: Self-pay

## 2020-02-11 ENCOUNTER — Encounter: Payer: Self-pay | Admitting: Physician Assistant

## 2020-02-11 ENCOUNTER — Ambulatory Visit (INDEPENDENT_AMBULATORY_CARE_PROVIDER_SITE_OTHER): Payer: Medicare Other | Admitting: *Deleted

## 2020-02-11 ENCOUNTER — Ambulatory Visit: Payer: Medicare Other | Admitting: Physician Assistant

## 2020-02-11 ENCOUNTER — Ambulatory Visit (HOSPITAL_COMMUNITY): Payer: Medicare Other | Attending: Internal Medicine

## 2020-02-11 VITALS — BP 104/68 | HR 78 | Ht 67.0 in | Wt 233.0 lb

## 2020-02-11 DIAGNOSIS — Z5181 Encounter for therapeutic drug level monitoring: Secondary | ICD-10-CM | POA: Diagnosis not present

## 2020-02-11 DIAGNOSIS — I2510 Atherosclerotic heart disease of native coronary artery without angina pectoris: Secondary | ICD-10-CM | POA: Diagnosis not present

## 2020-02-11 DIAGNOSIS — I10 Essential (primary) hypertension: Secondary | ICD-10-CM

## 2020-02-11 DIAGNOSIS — Z952 Presence of prosthetic heart valve: Secondary | ICD-10-CM

## 2020-02-11 DIAGNOSIS — I350 Nonrheumatic aortic (valve) stenosis: Secondary | ICD-10-CM | POA: Insufficient documentation

## 2020-02-11 DIAGNOSIS — I480 Paroxysmal atrial fibrillation: Secondary | ICD-10-CM

## 2020-02-11 DIAGNOSIS — I5042 Chronic combined systolic (congestive) and diastolic (congestive) heart failure: Secondary | ICD-10-CM | POA: Diagnosis not present

## 2020-02-11 LAB — POCT INR: INR: 2.7 (ref 2.0–3.0)

## 2020-02-11 LAB — ECHOCARDIOGRAM COMPLETE
AR max vel: 2.06 cm2
AV Area VTI: 2.17 cm2
AV Area mean vel: 1.97 cm2
AV Mean grad: 8.2 mmHg
AV Peak grad: 12.5 mmHg
Ao pk vel: 1.77 m/s
Area-P 1/2: 2 cm2
Height: 67 in
S' Lateral: 4 cm
Weight: 3728 oz

## 2020-02-11 MED ORDER — AZITHROMYCIN 500 MG PO TABS: 500.0000 mg | ORAL_TABLET | ORAL | 12 refills | Status: DC

## 2020-02-11 NOTE — Patient Instructions (Signed)
Medication Instructions:  Your provider recommends that you continue on your current medications as directed. Please refer to the Current Medication list given to you today.   *If you need a refill on your cardiac medications before your next appointment, please call your pharmacy*   Follow-Up: Your provider recommends that you schedule a follow-up appointment in 6 months with Dr. [name].

## 2020-02-11 NOTE — Patient Instructions (Signed)
Description   Continue taking Warfarin 1.5 tablets every day except 1 tablet on Tuesdays, Thursdays and Saturdays.  Recheck INR 8 weeks.  Coumadin clinic [phone].

## 2020-02-17 ENCOUNTER — Other Ambulatory Visit: Payer: Self-pay | Admitting: Cardiology

## 2020-04-07 ENCOUNTER — Ambulatory Visit: Payer: Medicare Other | Admitting: *Deleted

## 2020-04-07 ENCOUNTER — Other Ambulatory Visit: Payer: Self-pay

## 2020-04-07 DIAGNOSIS — I480 Paroxysmal atrial fibrillation: Secondary | ICD-10-CM

## 2020-04-07 DIAGNOSIS — Z5181 Encounter for therapeutic drug level monitoring: Secondary | ICD-10-CM

## 2020-04-07 LAB — POCT INR: INR: 2.7 (ref 2.0–3.0)

## 2020-04-07 NOTE — Patient Instructions (Signed)
Description   Continue taking Warfarin 1.5 tablets every day except 1 tablet on Tuesdays, Thursdays and Saturdays.  Recheck INR 8 weeks.  Coumadin clinic [phone].

## 2020-05-04 ENCOUNTER — Ambulatory Visit (INDEPENDENT_AMBULATORY_CARE_PROVIDER_SITE_OTHER): Payer: Medicare Other

## 2020-05-04 DIAGNOSIS — I442 Atrioventricular block, complete: Secondary | ICD-10-CM

## 2020-05-04 LAB — CUP PACEART REMOTE DEVICE CHECK
Battery Impedance: 1415 Ohm
Battery Remaining Longevity: 51 mo
Battery Voltage: 2.76 V
Brady Statistic AP VP Percent: 1 %
Brady Statistic AP VS Percent: 71 %
Brady Statistic AS VP Percent: 1 %
Brady Statistic AS VS Percent: 28 %
Date Time Interrogation Session: 20211207112502
Implantable Lead Implant Date: 20020926
Implantable Lead Implant Date: 20020926
Implantable Lead Location: 753859
Implantable Lead Location: 753860
Implantable Lead Model: 5076
Implantable Lead Model: 5092
Implantable Pulse Generator Implant Date: 20120905
Lead Channel Impedance Value: 519 Ohm
Lead Channel Impedance Value: 992 Ohm
Lead Channel Pacing Threshold Amplitude: 0.5 V
Lead Channel Pacing Threshold Amplitude: 1 V
Lead Channel Pacing Threshold Pulse Width: 0.4 ms
Lead Channel Pacing Threshold Pulse Width: 0.4 ms
Lead Channel Setting Pacing Amplitude: 2 V
Lead Channel Setting Pacing Amplitude: 2.5 V
Lead Channel Setting Pacing Pulse Width: 0.4 ms
Lead Channel Setting Sensing Sensitivity: 4 mV

## 2020-05-13 NOTE — Progress Notes (Signed)
Remote pacemaker transmission.   

## 2020-05-15 ENCOUNTER — Other Ambulatory Visit: Payer: Self-pay | Admitting: Cardiology

## 2020-06-02 ENCOUNTER — Ambulatory Visit: Payer: Medicare Other | Admitting: *Deleted

## 2020-06-02 ENCOUNTER — Other Ambulatory Visit: Payer: Self-pay

## 2020-06-02 DIAGNOSIS — Z5181 Encounter for therapeutic drug level monitoring: Secondary | ICD-10-CM | POA: Diagnosis not present

## 2020-06-02 DIAGNOSIS — I480 Paroxysmal atrial fibrillation: Secondary | ICD-10-CM

## 2020-06-02 LAB — POCT INR: INR: 3.2 — AB (ref 2.0–3.0)

## 2020-06-02 NOTE — Patient Instructions (Signed)
Description   Hold warfarin today, then Continue taking Warfarin 1.5 tablets every day except 1 tablet on Tuesdays, Thursdays and Saturdays.  Recheck INR in 4 weeks.  Coumadin clinic [phone].

## 2020-06-22 ENCOUNTER — Other Ambulatory Visit: Payer: Self-pay | Admitting: Cardiology

## 2020-06-24 NOTE — Progress Notes (Unsigned)
Cardiology Office Note Date:  06/24/2020  Patient ID:  Brandon Klein, Brandon Klein 02-Nov-1940, MRN [id number] PCP:  Sigmund Hazel, MD  Cardiologist:  Dr. [name] Electrophysiologist: Dr. Graciela Husbands    Chief Complaint:  Annual device visit  History of Present Illness: Brandon Klein is a 80 y.o. 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 Graciela Husbands, last seen by him Jan 2020.  At that visit he was doing well, no changes were made to his programming or medicines.  Since then he has undergone TAVR procedure Nov 2020.  I saw him Jan 2021 He feels well.  Mentions he had cataract surgery and this has been huge for him, sees well has had a couple aura like symptoms the eye MD told his was likely a headache,  He has felt well since his PCI and TAVR.  Denies any CP, palpitations or cardiac awareness.  No dizziness (outside of his known occasional vertigo), no near syncope or syncope.   He says his knees are his primary exertional limitations, no SOB or DOE. He will bleed more with trauma, minor skin scrapes on the Plavix with his warfarin, otherwise no bleeding or signs of bleeding.  Follows with the coumadin clinic No changes were made, planned to see Dr. [name] as scheduled, remotes as usual and EP annually.  He saw K. Janee Morn, [place] 02/12/20, doing very well, exercising regularly, lost weight.  TTE again with LVEF 40-45%, grade I DD, valve was OK.  Updated endocarditis prophylaxis with the patient, otherwise no changes.  TODAY He is doing quite well. Continues to exercise at the [facility] regularly with very good exertional capacity. Home weight is stable No CP. He has PVCs that he feels, has had them for many years, uhanged behavier, here and there No  palpitations otherwise. No dizzy spells, near syncope or syncope. No bleeding or signs of bleeding PMD manages  Check labs/lipids.   Device information: MDT dual chamber PPM, implanted 02/21/01, gen change 02/01/11, Dr. Ladona Ridgel, followed by Dr. Graciela Husbands   Past Medical History:  Diagnosis Date  . Chronic renal insufficiency   . Coronary artery disease    a. s/p PCI of LAD 1997. b. Cutting balloon angioplasty to LCx in 2002. c. s/p cath 11/2013 with severe 3 vessel ASCAD s/p CABG with LIMA to LAD, SVG to diag, SVG to left circ and SVG to PDA. d. cath 07/07/2014 occluded SVG to PDA, all other grafts patent. 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 DJD (degenerative joint disease)   . Epistaxis   . GERD (gastroesophageal reflux disease)   . GI bleeding    a.  with benign gastric polypectomy 05/2014.  Marland Kitchen HLD (hyperlipidemia)   . HTN (hypertension)   . Obesity   . OSA (obstructive sleep apnea)   . Pacemaker    a. 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 b. MDT generator replacement 2012.  Marland Kitchen PAF (paroxysmal atrial fibrillation) (HCC)    on chronic systemic anticoagulation  . PVC's (premature ventricular contractions)   . 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 Severe aortic stenosis   . Syncope    a. vasovagal with documented bradycardia.  . Type 2 diabetes mellitus without complications (HCC) 12/02/2015    Past Surgical History:  Procedure Laterality Date  . ANGIOPLASTY  1997  . CHOLECYSTECTOMY    . COLONOSCOPY WITH PROPOFOL N/A 05/05/2014   Procedure: COLONOSCOPY WITH PROPOFOL;  Surgeon: Charolett Bumpers, MD;  Location: [facility] ENDOSCOPY;  Service: Endoscopy;  Laterality: N/A;  . CORONARY ARTERY BYPASS GRAFT N/A 12/01/2013   Procedure: CORONARY ARTERY BYPASS GRAFT TIMES FOUR USING LEFT INTERNAL MAMMARY ARTERY TO LAD, SAPHENOUS  VEIN GRAFTS TO DIAGONAL, CIRCUMFELX AND POSTERIOR DESCENDING;  Surgeon: Kerin Perna, MD;  Location: [facility] OR;  Service: Open Heart Surgery;  Laterality: N/A;  . CORONARY STENT INTERVENTION N/A 03/21/2019   Procedure: CORONARY STENT INTERVENTION;  Surgeon: Kathleene Hazel, MD;  Location: [facility] INVASIVE CV LAB;  Service: Cardiovascular;  Laterality: N/A;  Distal RCA  . ESOPHAGOGASTRODUODENOSCOPY (EGD) WITH PROPOFOL N/A 05/05/2014   Procedure: ESOPHAGOGASTRODUODENOSCOPY (EGD) WITH PROPOFOL;  Surgeon: Charolett Bumpers, MD;  Location: [facility] ENDOSCOPY;  Service: Endoscopy;  Laterality: N/A;  . KNEE ARTHROSCOPY  2000   Left  . LEFT AND RIGHT HEART CATHETERIZATION WITH CORONARY/GRAFT ANGIOGRAM N/A 07/07/2014   Procedure: LEFT AND RIGHT HEART CATHETERIZATION WITH Isabel Caprice;  Surgeon: Kathleene Hazel, MD;  Location: [facility] CATH LAB;  Service: Cardiovascular;  Laterality: N/A;  . LEFT HEART CATHETERIZATION WITH CORONARY ANGIOGRAM N/A 11/25/2013   Procedure: LEFT HEART CATHETERIZATION WITH CORONARY ANGIOGRAM;  Surgeon: [name], MD;  Location: [facility] CATH LAB;  Service: Cardiovascular;  Laterality: N/A;  . PACEMAKER INSERTION  2002   generator change MDT by Dr Graciela Husbands in 2012  . RIGHT/LEFT HEART CATH AND CORONARY/GRAFT ANGIOGRAPHY N/A 03/21/2019   Procedure: RIGHT/LEFT HEART CATH AND CORONARY/GRAFT ANGIOGRAPHY;  Surgeon: Kathleene Hazel, MD;  Location: [facility] INVASIVE CV LAB;  Service: Cardiovascular;  Laterality: N/A;  . TEE WITHOUT CARDIOVERSION N/A 08/31/2014   Procedure: TRANSESOPHAGEAL ECHOCARDIOGRAM (TEE);  Surgeon: [name], MD;  Location: [facility] ENDOSCOPY;  Service: Cardiovascular;  Laterality: N/A;  . TEE WITHOUT CARDIOVERSION N/A 04/08/2019   Procedure: TRANSESOPHAGEAL ECHOCARDIOGRAM (TEE);  Surgeon: Kathleene Hazel, MD;  [facility] OR;  Service: Open Heart Surgery;  Laterality: N/A;  . TRANSCATHETER AORTIC VALVE REPLACEMENT, TRANSFEMORAL  04/08/2019  . TRANSCATHETER  AORTIC VALVE REPLACEMENT, TRANSFEMORAL N/A 04/08/2019   Procedure: TRANSCATHETER AORTIC VALVE REPLACEMENT, TRANSFEMORAL;  Surgeon: Kathleene Hazel, MD;  Location: [facility] OR;  Service: Open Heart Surgery;  Laterality: N/A;    Current Outpatient Medications  Medication Sig Dispense Refill  . Acetaminophen (TYLENOL ARTHRITIS PAIN PO) Take 1,300 mg by mouth 3 (three) times daily as needed.    Marland Kitchen aspirin 81 MG EC tablet Take 81 mg by mouth daily. Swallow whole.    Marland Kitchen atorvastatin (LIPITOR) 80 MG tablet TAKE 1 TABLET BY MOUTH  DAILY 90 tablet 3  . azithromycin (ZITHROMAX) 500 MG tablet Take 1 tablet (500 mg total) by mouth as directed. Take one tablet 1 hour before any dental work including cleanings. 6 tablet 12  . carvedilol (COREG) 25 MG tablet TAKE 1 TABLET(25 MG) BY MOUTH TWICE DAILY WITH A MEAL 180 tablet 0  . docusate sodium (COLACE) 100 MG capsule Take 200 mg by mouth daily as needed for mild constipation.     . fexofenadine (ALLEGRA) 180 MG tablet Take  180 mg by mouth daily.     . fluticasone (FLONASE) 50 MCG/ACT nasal spray Place 2 sprays into the nose daily.     . furosemide (LASIX) 20 MG tablet TAKE 1 TABLET BY MOUTH TWICE DAILY. WITH 80 MG TO TOTAL 100MG  180 tablet 3  . furosemide (LASIX) 80 MG tablet TAKE 1 TABLET BY MOUTH TWICE DAILY. TAKE WITH 20 MG TABLET 180 tablet 3  . Glucosamine-Chondroitin 500-400 MG CAPS Take 1 tablet by mouth daily.    . hydrALAZINE (APRESOLINE) 25 MG tablet TAKE 1 TABLET(25 MG) BY MOUTH THREE TIMES DAILY 270 tablet 3  . isosorbide mononitrate (IMDUR) 30 MG 24 hr tablet TAKE 1 TABLET(30 MG) BY MOUTH DAILY 90 tablet 0  . L-Lysine 1000 MG TABS Take 1,000 mg by mouth daily.     . meclizine (ANTIVERT) 25 MG tablet Take 25 mg by mouth daily as needed for dizziness.     . Multiple Vitamin (MULTIVITAMIN) tablet Take 1 tablet by mouth daily. Centrum silver    . nitroGLYCERIN (NITROSTAT) 0.4 MG SL tablet Place 1 tablet (0.4 mg total) under the tongue every 5 (five)  minutes as needed for chest pain. 30 tablet 3  . ONE TOUCH ULTRA TEST test strip 1 each by Other route every morning.     DELICA LANCETS FINE MISC 1 each by Other route every morning.     . pantoprazole (PROTONIX) 40 MG tablet Take 40 mg by mouth daily.     . potassium chloride SA (KLOR-CON) 20 MEQ tablet Take 2 tablets (40 mEq total) by mouth 2 (two) times daily. 360 tablet 3  . RA NATURAL MAGNESIUM 250 MG TABS TAKE 2 TABLETS TWICE DAILY. 120 tablet 5  . spironolactone (ALDACTONE) 25 MG tablet TAKE 1 TABLET(25 MG) BY MOUTH DAILY 90 tablet 0  . warfarin (COUMADIN) 5 MG tablet TAKE 1 TO 1 AND 1/2 TABLETS BY MOUTH DAILY AS DIRECTED BY COUMADIN CLINIC 130 tablet 1   No current facility-administered medications for this visit.    Allergies:   Acetaminophen-codeine, Vascepa [icosapent ethyl], Amoxicillin, and Diovan [valsartan]   Social History:  The patient  reports that he quit smoking about 54 years ago. His smoking use included cigarettes. He has a 10.00 pack-year smoking history. He has never used smokeless tobacco. He reports current alcohol use. He reports that he does not use drugs.   Family History:  The patient's family history includes Colon cancer in his mother; Heart attack in his brother and mother; Leukemia in his father.  ROS:  Please see the history of present illness.  All other systems are reviewed and otherwise negative.   PHYSICAL EXAM:  VS:  There were no vitals taken for this visit. BMI: There is no height or weight on file to calculate BMI. Well nourished, well developed, in no acute distress  HEENT: normocephalic, atraumatic  Neck: no JVD, carotid bruits or masses Cardiac:  RRR; no significant murmurs, no rubs, or gallops Lungs:  CTA b/l, no wheezing, rhonchi or rales  Abd: soft, non-tender, obese MS: no deformity or atrophy Ext: no edema  Skin: warm and dry, no rash Neuro:  No gross deficits appreciated Psych: euthymic mood, full affect  PPM site:  stable, no tethering, skin change, edema or tenderness   EKG:  Done today and reviewed by myself AP/VS, 73bpm, RBBB   PPM interrogation done today and reviewed by myself:  Battery and lead measurements are good AMS noted 1.0%, back in may 2021  were his episodes HVR episodes noted as well, not new for him, longest 7 seconds, EGMs reviewed, c/w NSVT No programming changes made   02/11/2020: TTE IMPRESSIONS  1. Left ventricular ejection fraction, by estimation, is 40 to 45%. The  left ventricle has mildly decreased function. The left ventricle  demonstrates global hypokinesis. Left ventricular diastolic parameters are  consistent with Grade I diastolic  dysfunction (impaired relaxation).  2. Right ventricular systolic function is mildly reduced. The right  ventricular size is normal.  3. The mitral valve is abnormal. Trivial mitral valve regurgitation.  4. The aortic valve has been repaired/replaced. Aortic valve  regurgitation is trivial. There is a 29 mm Sapien prosthetic (TAVR) valve  present in the aortic position. Echo findings are consistent with  perivalvular leak of the aortic prosthesis. Aortic  valve mean gradient measures 8.2 mmHg. Aortic valve Vmax measures 1.77  m/s. Peak gradient 12.5 mmHg. DI 0.41.  5. Aortic dilatation noted. There is borderline dilatation of the  ascending aorta, measuring 38 mm.  6. The inferior vena cava is normal in size with greater than 50%  respiratory variability, suggesting right atrial pressure of 3 mmHg.    05/15/2019 TTE IMPRESSIONS 1. Left ventricular ejection fraction, by visual estimation, is 40 to 45%. The left ventricle has mild to moderately decreased function. There is mildly increased left ventricular hypertrophy.  2. Left ventricular diastolic parameters are consistent with Grade I diastolic dysfunction (impaired relaxation).  3. The left ventricle demonstrates global hypokinesis.  4. Global right ventricle has normal  systolic function.The right ventricular size is mildly enlarged. No increase in right ventricular wall thickness.  5. Left atrial size was moderately dilated.  6. Right atrial size was moderately dilated.  7. The mitral valve is normal in structure. Mild mitral valve regurgitation. No evidence of mitral stenosis.  8. The tricuspid valve is normal in structure. Tricuspid valve regurgitation is mild.  9. Aortic valve regurgitation is not visualized. No evidence of aortic valve sclerosis or stenosis. 10. The pulmonic valve was normal in structure. Pulmonic valve regurgitation is not visualized. 11. Normal pulmonary artery systolic pressure. 12. The tricuspid regurgitant velocity is 1.98 m/s, and with an assumed right atrial pressure of 3 mmHg, the estimated right ventricular systolic pressure is normal at 18.7 mmHg. 13. A pacer wire is visualized in the RV and RA. 14. The inferior vena cava is normal in size with greater than 50% respiratory variability, suggesting right atrial pressure of 3 mmHg. 15. Normal transaortic gradients peak/mean 19/11 mmHg, trivial paravalvular leak. LVEF has decreased now 40-45% with diffuse hypokinesis.    03/21/2019: LHC/PCI  Mid RCA lesion is 30% stenosed.  Dist RCA-1 lesion is 90% stenosed.  Dist RCA-2 lesion is 90% stenosed.  SVG graft was not injected.  Origin to Prox Graft lesion is 100% stenosed.  Ramus lesion is 80% stenosed.  Mid Cx lesion is 100% stenosed.  SVG graft was visualized by angiography.  SVG graft was visualized by angiography.  Mid LAD lesion is 100% stenosed.  A drug-eluting stent was successfully placed using a STENT SYNERGY DES [id number].  Post intervention, there is a 40% residual stenosis.  Post intervention, there is a 0% residual stenosis.   1. 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 Known occlusion SVG to PDA. Severe stenosis distal RCA.  3. Successful PTCA/DES x 1 distal RCA.  4.  Chronic occlusion mid LAD. Patent LIMA to mid LAD. Patent SVG to Diagonal 5.  Chronic occlusion mid Circumflex. Patent SVG to OM.  6. Severe disease in the small to moderate caliber intermediate branch 7. Severe aortic stenosis  By echo. Cath data with mean gradient 23.3 mmHg, peak to peak gradient 32 mm Hg).   Will continue planning for TAVR. Will continue ASA, Plavix and coumadin for now. Will continue DAPT with ASA and Plavix until after his TAVR procedure.    10/216/18, lexiscan stress myoview  Nuclear stress EF: 42%. Ejection fraction on echocardiogram was 45%.  There was no ST segment deviation noted during stress.  This is a low risk study. No perfusion defects identified. Ejection fraction is mildly reduced, consistent with echocardiogram.  02/08/17: TTE Study Conclusions - Left ventricle: Abnormal septal motion mid and basal inferior   wall hypokinesis. Systolic function was mildly reduced. The   estimated ejection fraction was in the range of 45% to 50%.   Doppler parameters are consistent with abnormal left ventricular   relaxation (grade 1 diastolic dysfunction). - Aortic valve: Gradients actually appear lower than those recorded   on 02/07/16. There was moderate stenosis. Valve area (VTI): 0.74   cm^2. Valve area (Vmax): 0.7 cm^2. Valve area (Vmean): 0.74 cm^2. - Left atrium: The atrium was moderately dilated. - Atrial septum: No defect or patent foramen ovale was identified.  02/07/16: TTE, LVEF 40% 02/23/15: TTE, LVEF 35-40% 09/28/14: TTE, LVEF 25-30%, probable severe AS, dobutamine done described as moderate  07/07/14: LHC Impression: 1. 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 Moderately severe distal RCA stenosis, this vessel is no longer protected by the graft 3. Ischemic cardiomyopathy 4. Moderate aortic stenosis although severity may be underestimated given low LVEF Recommendations: He appears to remain volume overloaded. I would  resume diuresis today. I cannot explain the drop in his LVEF by the occlusion of the graft to the PDA. His native RCA has moderately severe distal disease but good flow. This could be easily approached with PCI in the future. For now, would not recommend PCI of the RCA.   Recent Labs: 07/07/2019: ALT 20  07/07/2019: Chol/HDL Ratio 2.9; Cholesterol, Total 129; HDL 44; LDL Chol Calc (NIH) 56; Triglycerides 173   CrCl cannot be calculated (Patient's most recent lab result is older than the maximum 21 days allowed.).   Wt Readings from Last 3 Encounters:  02/11/20 233 lb (105.7 kg)  01/29/20 235 lb 3.2 oz (106.7 kg)  12/30/19 241 lb 6.4 oz (109.5 kg)     Other studies reviewed: Additional studies/records reviewed today include: summarized above  ASSESSMENT AND PLAN:  1. PPM     Intact function, no programming changes made  2. CAD     No anginal complaints     On statin, BB, nitrate     C/w Dr.Turner    3. ICM, LVEF has improved over the years     Exam appears euvolemic, no symptoms of fluid OL     pt weighs daily, reports stable     On BB, diuretic tx, no ACE/ARB, has hx of CRI, LVEF remains 45-50%  4. Paroxysmal Afib     CHA2DS2Vasc is at least 5, on warfarin     Monitored with the coumadin clinic     1.0% burden  5. HTN     Looks OK, no changes  6. 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 VHD  S/p TAVR Nov 2020     Echo Sept 2021 looked OK            Disposition:  Remotes as usual, EP in c linic in a year, sooner if needed.  He sees Dr. Mayford Knifeurner in March   Current medicines are reviewed at length with the patient today.  The patient did not have any concerns regarding medicines.  Norma FredricksonSigned, Renee Ursuy, PA-C 06/24/2020 6:24 PM     [facility] [address] [place] [address] [phone] (office)  [phone] (fax)

## 2020-06-25 ENCOUNTER — Ambulatory Visit: Payer: Medicare Other | Admitting: Physician Assistant

## 2020-06-25 ENCOUNTER — Other Ambulatory Visit: Payer: Self-pay

## 2020-06-25 ENCOUNTER — Encounter: Payer: Self-pay | Admitting: Physician Assistant

## 2020-06-25 VITALS — BP 110/58 | HR 73 | Ht 67.0 in | Wt 235.0 lb

## 2020-06-25 DIAGNOSIS — I2510 Atherosclerotic heart disease of native coronary artery without angina pectoris: Secondary | ICD-10-CM

## 2020-06-25 DIAGNOSIS — I480 Paroxysmal atrial fibrillation: Secondary | ICD-10-CM

## 2020-06-25 DIAGNOSIS — Z952 Presence of prosthetic heart valve: Secondary | ICD-10-CM

## 2020-06-25 DIAGNOSIS — I472 Ventricular tachycardia: Secondary | ICD-10-CM

## 2020-06-25 DIAGNOSIS — I4729 Other ventricular tachycardia: Secondary | ICD-10-CM

## 2020-06-25 DIAGNOSIS — I255 Ischemic cardiomyopathy: Secondary | ICD-10-CM

## 2020-06-25 DIAGNOSIS — Z950 Presence of cardiac pacemaker: Secondary | ICD-10-CM | POA: Diagnosis not present

## 2020-06-25 DIAGNOSIS — I10 Essential (primary) hypertension: Secondary | ICD-10-CM

## 2020-06-25 LAB — CUP PACEART INCLINIC DEVICE CHECK
Battery Impedance: 1470 Ohm
Battery Remaining Longevity: 53 mo
Battery Voltage: 2.76 V
Brady Statistic AP VP Percent: 1 %
Brady Statistic AP VS Percent: 71 %
Brady Statistic AS VP Percent: 1 %
Brady Statistic AS VS Percent: 28 %
Date Time Interrogation Session: 20220128141734
Implantable Lead Implant Date: 20020926
Implantable Lead Implant Date: 20020926
Implantable Lead Location: 753859
Implantable Lead Location: 753860
Implantable Lead Model: 5076
Implantable Lead Model: 5092
Implantable Pulse Generator Implant Date: 20120905
Lead Channel Impedance Value: 535 Ohm
Lead Channel Impedance Value: 865 Ohm
Lead Channel Pacing Threshold Amplitude: 0.625 V
Lead Channel Pacing Threshold Amplitude: 0.75 V
Lead Channel Pacing Threshold Amplitude: 1 V
Lead Channel Pacing Threshold Amplitude: 1.375 V
Lead Channel Pacing Threshold Pulse Width: 0.4 ms
Lead Channel Pacing Threshold Pulse Width: 0.4 ms
Lead Channel Pacing Threshold Pulse Width: 0.4 ms
Lead Channel Pacing Threshold Pulse Width: 0.4 ms
Lead Channel Sensing Intrinsic Amplitude: 11.2 mV
Lead Channel Sensing Intrinsic Amplitude: 2 mV
Lead Channel Setting Pacing Amplitude: 2 V
Lead Channel Setting Pacing Amplitude: 2.75 V
Lead Channel Setting Pacing Pulse Width: 0.4 ms
Lead Channel Setting Sensing Sensitivity: 4 mV

## 2020-06-25 NOTE — Patient Instructions (Addendum)
Medication Instructions:   Your physician recommends that you continue on your current medications as directed. Please refer to the Current Medication list given to you today.]  *If you need a refill on your cardiac medications before your next appointment, please call your pharmacy*   Lab Work: BMET AND MAG TODAY   If you have labs (blood work) drawn today and your tests are completely normal, you will receive your results only by: Marland Kitchen MyChart Message (if you have MyChart) OR . A paper copy in the mail If you have any lab test that is abnormal or we need to change your treatment, we will call you to review the results.   Testing/Procedures: NONE ORDERED  TODAY     Follow-Up: At [facility], you and your health needs are our priority.  As part of our continuing mission to provide you with exceptional heart care, we have created designated Provider Care Teams.  These Care Teams include your primary Cardiologist (physician) and Advanced Practice Providers (APPs -  Physician Assistants and Nurse Practitioners) who all work together to provide you with the care you need, when you need it.  We recommend signing up for the patient portal called "MyChart".  Sign up information is provided on this After Visit Summary.  MyChart is used to connect with patients for Virtual Visits (Telemedicine).  Patients are able to view lab/test results, encounter notes, upcoming appointments, etc.  Non-urgent messages can be sent to your provider as well.   To learn more about what you can do with MyChart, go to [email or web address].    Your next appointment:   1 year(s)  The format for your next appointment:   In Person  Provider:   You may see Dr. Ladona Ridgel  or one of the following Advanced Practice Providers on your designated Care Team:    Gypsy Balsam, NP  [name], PA-C  Casimiro Needle "Otilio Saber, [place]    Other Instructions

## 2020-06-26 LAB — BASIC METABOLIC PANEL
BUN/Creatinine Ratio: 21 (ref 10–24)
BUN: 30 mg/dL — ABNORMAL HIGH (ref 8–27)
CO2: 24 mmol/L (ref 20–29)
Calcium: 9.2 mg/dL (ref 8.6–10.2)
Chloride: 101 mmol/L (ref 96–106)
Creatinine, Ser: 1.43 mg/dL — ABNORMAL HIGH (ref 0.76–1.27)
GFR calc Af Amer: 53 mL/min/{1.73_m2} — ABNORMAL LOW (ref >59)
GFR calc non Af Amer: 46 mL/min/{1.73_m2} — ABNORMAL LOW (ref >59)
Glucose: 118 mg/dL — ABNORMAL HIGH (ref 65–99)
Potassium: 4.7 mmol/L (ref 3.5–5.2)
Sodium: 139 mmol/L (ref 134–144)

## 2020-06-26 LAB — MAGNESIUM: Magnesium: 2.2 mg/dL (ref 1.6–2.3)

## 2020-06-28 ENCOUNTER — Other Ambulatory Visit: Payer: Self-pay | Admitting: *Deleted

## 2020-06-28 DIAGNOSIS — Z79899 Other long term (current) drug therapy: Secondary | ICD-10-CM

## 2020-06-28 NOTE — Progress Notes (Signed)
Spoke with patient and patient aware of results and verbalized understanding. Pt states he  gets about 64 oz of water or more  in a day. Repeat lab work 07-02-20(7days)

## 2020-06-28 NOTE — Progress Notes (Signed)
REPEAT BMET/ URSUY

## 2020-06-30 ENCOUNTER — Other Ambulatory Visit: Payer: Self-pay

## 2020-06-30 ENCOUNTER — Ambulatory Visit: Payer: Medicare Other | Admitting: *Deleted

## 2020-06-30 DIAGNOSIS — Z5181 Encounter for therapeutic drug level monitoring: Secondary | ICD-10-CM

## 2020-06-30 DIAGNOSIS — I480 Paroxysmal atrial fibrillation: Secondary | ICD-10-CM | POA: Diagnosis not present

## 2020-06-30 LAB — POCT INR: INR: 3.2 — AB (ref 2.0–3.0)

## 2020-06-30 NOTE — Patient Instructions (Addendum)
Description   Hold today's dose of Warfarin, then start taking Warfarin 1 tablet everyday except 1.5 tablet on Mondays, Wednesdays, and Fridays.  Recheck INR in 4 weeks.  Coumadin clinic [phone].

## 2020-07-02 ENCOUNTER — Other Ambulatory Visit: Payer: Self-pay

## 2020-07-02 ENCOUNTER — Other Ambulatory Visit: Payer: Medicare Other | Admitting: *Deleted

## 2020-07-02 DIAGNOSIS — Z79899 Other long term (current) drug therapy: Secondary | ICD-10-CM | POA: Diagnosis not present

## 2020-07-03 LAB — BASIC METABOLIC PANEL
BUN/Creatinine Ratio: 20 (ref 10–24)
BUN: 27 mg/dL (ref 8–27)
CO2: 24 mmol/L (ref 20–29)
Calcium: 9.1 mg/dL (ref 8.6–10.2)
Chloride: 101 mmol/L (ref 96–106)
Creatinine, Ser: 1.36 mg/dL — ABNORMAL HIGH (ref 0.76–1.27)
GFR calc Af Amer: 57 mL/min/{1.73_m2} — ABNORMAL LOW (ref >59)
GFR calc non Af Amer: 49 mL/min/{1.73_m2} — ABNORMAL LOW (ref >59)
Glucose: 125 mg/dL — ABNORMAL HIGH (ref 65–99)
Potassium: 4.3 mmol/L (ref 3.5–5.2)
Sodium: 137 mmol/L (ref 134–144)

## 2020-07-20 DIAGNOSIS — M1990 Unspecified osteoarthritis, unspecified site: Secondary | ICD-10-CM | POA: Diagnosis not present

## 2020-07-20 DIAGNOSIS — G8929 Other chronic pain: Secondary | ICD-10-CM | POA: Diagnosis not present

## 2020-07-20 DIAGNOSIS — I2510 Atherosclerotic heart disease of native coronary artery without angina pectoris: Secondary | ICD-10-CM | POA: Diagnosis not present

## 2020-07-20 DIAGNOSIS — I130 Hypertensive heart and chronic kidney disease with heart failure and stage 1 through stage 4 chronic kidney disease, or unspecified chronic kidney disease: Secondary | ICD-10-CM | POA: Diagnosis not present

## 2020-07-20 DIAGNOSIS — I4891 Unspecified atrial fibrillation: Secondary | ICD-10-CM | POA: Diagnosis not present

## 2020-07-20 DIAGNOSIS — K219 Gastro-esophageal reflux disease without esophagitis: Secondary | ICD-10-CM | POA: Diagnosis not present

## 2020-07-20 DIAGNOSIS — I509 Heart failure, unspecified: Secondary | ICD-10-CM | POA: Diagnosis not present

## 2020-07-20 DIAGNOSIS — I10 Essential (primary) hypertension: Secondary | ICD-10-CM | POA: Diagnosis not present

## 2020-07-20 DIAGNOSIS — N183 Chronic kidney disease, stage 3 unspecified: Secondary | ICD-10-CM | POA: Diagnosis not present

## 2020-07-20 DIAGNOSIS — E1169 Type 2 diabetes mellitus with other specified complication: Secondary | ICD-10-CM | POA: Diagnosis not present

## 2020-07-20 DIAGNOSIS — E782 Mixed hyperlipidemia: Secondary | ICD-10-CM | POA: Diagnosis not present

## 2020-07-20 DIAGNOSIS — E1122 Type 2 diabetes mellitus with diabetic chronic kidney disease: Secondary | ICD-10-CM | POA: Diagnosis not present

## 2020-07-28 ENCOUNTER — Ambulatory Visit (INDEPENDENT_AMBULATORY_CARE_PROVIDER_SITE_OTHER): Payer: Medicare Other | Admitting: *Deleted

## 2020-07-28 ENCOUNTER — Other Ambulatory Visit: Payer: Self-pay

## 2020-07-28 DIAGNOSIS — I480 Paroxysmal atrial fibrillation: Secondary | ICD-10-CM

## 2020-07-28 DIAGNOSIS — Z5181 Encounter for therapeutic drug level monitoring: Secondary | ICD-10-CM | POA: Diagnosis not present

## 2020-07-28 LAB — POCT INR: INR: 2.1 (ref 2.0–3.0)

## 2020-07-28 NOTE — Patient Instructions (Addendum)
Description   Continue taking Warfarin 1 tablet everyday except 1.5 tablets on Mondays, Wednesdays, and Fridays.  Recheck INR in 3 weeks with Dr. [name] (normally 5-6 weeks).  Coumadin clinic [phone].

## 2020-08-03 ENCOUNTER — Ambulatory Visit (INDEPENDENT_AMBULATORY_CARE_PROVIDER_SITE_OTHER): Payer: Medicare Other

## 2020-08-03 DIAGNOSIS — I255 Ischemic cardiomyopathy: Secondary | ICD-10-CM | POA: Diagnosis not present

## 2020-08-03 LAB — CUP PACEART REMOTE DEVICE CHECK
Battery Impedance: 1527 Ohm
Battery Remaining Longevity: 51 mo
Battery Voltage: 2.76 V
Brady Statistic AP VP Percent: 0 %
Brady Statistic AP VS Percent: 76 %
Brady Statistic AS VP Percent: 0 %
Brady Statistic AS VS Percent: 23 %
Date Time Interrogation Session: 20220308113138
Implantable Lead Implant Date: 20020926
Implantable Lead Implant Date: 20020926
Implantable Lead Location: 753859
Implantable Lead Location: 753860
Implantable Lead Model: 5076
Implantable Lead Model: 5092
Implantable Pulse Generator Implant Date: 20120905
Lead Channel Impedance Value: 511 Ohm
Lead Channel Impedance Value: 934 Ohm
Lead Channel Pacing Threshold Amplitude: 0.625 V
Lead Channel Pacing Threshold Amplitude: 1.375 V
Lead Channel Pacing Threshold Pulse Width: 0.4 ms
Lead Channel Pacing Threshold Pulse Width: 0.4 ms
Lead Channel Setting Pacing Amplitude: 2 V
Lead Channel Setting Pacing Amplitude: 2.75 V
Lead Channel Setting Pacing Pulse Width: 0.4 ms
Lead Channel Setting Sensing Sensitivity: 4 mV

## 2020-08-08 ENCOUNTER — Other Ambulatory Visit: Payer: Self-pay | Admitting: Cardiology

## 2020-08-11 DIAGNOSIS — L608 Other nail disorders: Secondary | ICD-10-CM | POA: Diagnosis not present

## 2020-08-11 DIAGNOSIS — L600 Ingrowing nail: Secondary | ICD-10-CM | POA: Diagnosis not present

## 2020-08-11 NOTE — Progress Notes (Signed)
Remote pacemaker transmission.   

## 2020-08-13 ENCOUNTER — Other Ambulatory Visit: Payer: Self-pay | Admitting: Cardiology

## 2020-08-17 ENCOUNTER — Ambulatory Visit (INDEPENDENT_AMBULATORY_CARE_PROVIDER_SITE_OTHER): Payer: Medicare Other | Admitting: *Deleted

## 2020-08-17 ENCOUNTER — Other Ambulatory Visit: Payer: Self-pay

## 2020-08-17 ENCOUNTER — Ambulatory Visit: Payer: Medicare Other | Admitting: Cardiology

## 2020-08-17 ENCOUNTER — Encounter: Payer: Self-pay | Admitting: Cardiology

## 2020-08-17 VITALS — BP 119/76 | HR 80 | Ht 67.0 in | Wt 235.0 lb

## 2020-08-17 DIAGNOSIS — I350 Nonrheumatic aortic (valve) stenosis: Secondary | ICD-10-CM

## 2020-08-17 DIAGNOSIS — E785 Hyperlipidemia, unspecified: Secondary | ICD-10-CM

## 2020-08-17 DIAGNOSIS — I2510 Atherosclerotic heart disease of native coronary artery without angina pectoris: Secondary | ICD-10-CM | POA: Diagnosis not present

## 2020-08-17 DIAGNOSIS — I5042 Chronic combined systolic (congestive) and diastolic (congestive) heart failure: Secondary | ICD-10-CM | POA: Diagnosis not present

## 2020-08-17 DIAGNOSIS — I10 Essential (primary) hypertension: Secondary | ICD-10-CM | POA: Diagnosis not present

## 2020-08-17 DIAGNOSIS — I480 Paroxysmal atrial fibrillation: Secondary | ICD-10-CM

## 2020-08-17 DIAGNOSIS — Z5181 Encounter for therapeutic drug level monitoring: Secondary | ICD-10-CM

## 2020-08-17 LAB — POCT INR: INR: 2 (ref 2.0–3.0)

## 2020-08-17 NOTE — Progress Notes (Signed)
Cardiology Office Note:    Date:  08/17/2020   ID:  Brandon Klein, DOB 12-21-1940, MRN [id number]  PCP:  Sigmund Hazel, MD  Cardiologist:  Armanda Magic, MD    Referring MD: Juluis Rainier, MD   Chief Complaint  Patient presents with   Coronary Artery Disease   Hypertension   Atrial Fibrillation   Hyperlipidemia   Congestive Heart Failure    History of Present Illness:    Brandon Klein is a 80 y.o. 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  He has a hx of CAD and underwent 4V CABG in 2015. He has had a pacemaker implanted remotely for bradycardia and syncope. He has paroxysmal atrial fibrillation and is on coumadin. His aortic stenosis has been followed for several years.Echo 01/31/19 showed LVEF=45-50%with severe AS. TAVR workup delayed while he completed his planned cataract surgery. Cardiac cath 03/21/19 with 3/4 patent bypass grafts. The SVG to the PDA is known to be occluded. A drug eluting stent was placed in the distal RCA.   He underwent successful TAVR with a80mm Edwards Sapien 3 THV via the TF approach on 04/08/19. Post operative echo showed EF 50-55% with normally functioning TAVR with a mean gradient of 12 mm Hg and no PVL. He has done well in follow up.  He is here today for followup and is doing well.  He denies any chest pain or pressure, SOB, DOE, PND, orthopnea, LE edema, dizziness, palpitations or syncope. He is compliant with his meds and is tolerating meds with no SE.    Past Medical History:  Diagnosis Date   Chronic renal insufficiency    Coronary artery disease    a. s/p PCI of LAD 1997. b. Cutting balloon angioplasty to LCx in 2002. c. s/p cath 11/2013 with severe 3 vessel ASCAD s/p CABG with LIMA to LAD, SVG to diag, SVG to left circ  and SVG to PDA. d. cath 07/07/2014 occluded SVG to PDA, all other grafts patent. 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  with benign gastric polypectomy 05/2014.   HLD (hyperlipidemia)    HTN (hypertension)    Obesity    OSA (obstructive sleep apnea)    Pacemaker    a. 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 b. MDT generator replacement 2012.   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 vasovagal with documented bradycardia.   Type 2 diabetes mellitus without complications (HCC) 12/02/2015    Past Surgical History:  Procedure Laterality Date   ANGIOPLASTY  1997   CHOLECYSTECTOMY     COLONOSCOPY WITH PROPOFOL N/A 05/05/2014   Procedure: COLONOSCOPY WITH PROPOFOL;  Surgeon: Charolett Bumpers, MD;  Location: [facility] ENDOSCOPY;  Service: Endoscopy;  Laterality: N/A;   CORONARY ARTERY BYPASS GRAFT  N/A 12/01/2013   Procedure: CORONARY ARTERY BYPASS GRAFT TIMES FOUR USING LEFT INTERNAL MAMMARY ARTERY TO LAD, SAPHENOUS VEIN GRAFTS TO DIAGONAL, CIRCUMFELX AND POSTERIOR DESCENDING;  Surgeon: Kerin Perna, MD;  Location: [facility] OR;  Service: Open Heart Surgery;  Laterality: N/A;   CORONARY STENT INTERVENTION N/A 03/21/2019   Procedure: CORONARY STENT INTERVENTION;  Surgeon: Kathleene Hazel, MD;  Location: [facility] INVASIVE CV LAB;  Service: Cardiovascular;  Laterality: N/A;  Distal RCA   ESOPHAGOGASTRODUODENOSCOPY (EGD) WITH PROPOFOL N/A 05/05/2014   Procedure: ESOPHAGOGASTRODUODENOSCOPY (EGD) WITH PROPOFOL;  Surgeon: Charolett Bumpers, MD;   Location: [facility] ENDOSCOPY;  Service: Endoscopy;  Laterality: N/A;   KNEE ARTHROSCOPY  2000   Left   LEFT AND RIGHT HEART CATHETERIZATION WITH CORONARY/GRAFT ANGIOGRAM N/A 07/07/2014   Procedure: LEFT AND RIGHT HEART CATHETERIZATION WITH Isabel Caprice;  Surgeon: Kathleene Hazel, MD;  Location: [facility] CATH LAB;  Service: Cardiovascular;  Laterality: N/A;   LEFT HEART CATHETERIZATION WITH CORONARY ANGIOGRAM N/A 11/25/2013   Procedure: LEFT HEART CATHETERIZATION WITH CORONARY ANGIOGRAM;  Surgeon: [name], MD;  Location: [facility] CATH LAB;  Service: Cardiovascular;  Laterality: N/A;   PACEMAKER INSERTION  2002   generator change MDT by Dr Graciela Husbands in 2012   RIGHT/LEFT HEART CATH AND CORONARY/GRAFT ANGIOGRAPHY N/A 03/21/2019   Procedure: RIGHT/LEFT HEART CATH AND CORONARY/GRAFT ANGIOGRAPHY;  Surgeon: Kathleene Hazel, MD;  Location: [facility] INVAS[facility] CV [facility];  Service: Cardiovascular;  Laterality: N/A;   TEE WITHOUT CARDIOVERSION N/A 08/31/2014   Procedure: TRANSESOPHAGEAL ECHOCARDIOGRAM (TEE);  Surgeon: [name], MD;  Location: [facility] ENDOSCOPY;  Service: Cardiovascular;  Laterality: N/A;   TEE WITHOUT CARDIOVERSION N/A 04/08/2019   Procedure: TRANSESOPHAGEAL ECHOCARDIOGRAM (TEE);  Surgeon: Kathleene Hazel, MD;  Location: [facility] OR;  Service: Open Heart Surgery;  Laterality: N/A;   TRANSCATHETER AORTIC VALVE REPLACEMENT, TRANSFEMORAL  04/08/2019   TRANSCATHETER AORTIC VALVE REPLACEMENT, TRANSFEMORAL N/A 04/08/2019   Procedure: TRANSCATHETER AORTIC VALVE REPLACEMENT, TRANSFEMORAL;  Surgeon: Kathleene Hazel, MD;  Location: [facility] OR;  Service: Open Heart Surgery;  Laterality: N/A;    Current Medications: Current Meds  Medication Sig   Acetaminophen (TYLENOL ARTHRITIS PAIN PO) Take 1,300 mg by mouth 3 (three) times daily as needed.   aspirin 81 MG EC tablet Take 81 mg by mouth daily. Swallow whole.   atorvastatin (LIPITOR) 80 MG tablet TAKE 1 TABLET BY MOUTH  DAILY    azithromycin (ZITHROMAX) 500 MG tablet Take 1 tablet (500 mg total) by mouth as directed. Take one tablet 1 hour before any dental work including cleanings.   carvedilol (COREG) 25 MG tablet TAKE 1 TABLET(25 MG) BY MOUTH TWICE DAILY WITH A MEAL   docusate sodium (COLACE) 100 MG capsule Take 200 mg by mouth daily as needed for mild constipation.    fexofenadine (ALLEGRA) 180 MG tablet Take 180 mg by mouth daily.    fluticasone (FLONASE) 50 MCG/ACT nasal spray Place 2 sprays into the nose daily.    furosemide (LASIX) 20 MG tablet TAKE 1 TABLET BY MOUTH TWICE DAILY. WITH 80 MG TO TOTAL 100MG    furosemide (LASIX) 80 MG tablet TAKE 1 TABLET BY MOUTH TWICE DAILY. TAKE WITH 20 MG TABLET   Glucosamine-Chondroitin 500-400 MG CAPS Take 1 tablet by mouth daily.   hydrALAZINE (APRESOLINE) 25 MG tablet TAKE 1 TABLET(25 MG) BY MOUTH THREE TIMES DAILY   isosorbide mononitrate (IMDUR) 30 MG 24 hr tablet TAKE 1 TABLET(30 MG) BY MOUTH DAILY   L-Lysine 1000 MG TABS Take 1,000 mg  by mouth daily.    meclizine (ANTIVERT) 25 MG tablet Take 25 mg by mouth daily as needed for dizziness.   Multiple Vitamin (MULTIVITAMIN) tablet Take 1 tablet by mouth daily. Centrum silver   nitroGLYCERIN (NITROSTAT) 0.4 MG SL tablet Place 1 tablet (0.4 mg total) under the tongue every 5 (five) minutes as needed for chest pain.   ONE TOUCH ULTRA TEST test strip 1 each by Other route every morning.    ONETOUCH DELICA LANCETS FINE MISC 1 each by Other route every morning.    pantoprazole (PROTONIX) 40 MG tablet Take 40 mg by mouth daily.    potassium chloride SA (KLOR-CON) 20 MEQ tablet Take 2 tablets (40 mEq total) by mouth 2 (two) times daily.   RA NATURAL MAGNESIUM 250 MG TABS TAKE 2 TABLETS TWICE DAILY.   spironolactone (ALDACTONE) 25 MG tablet TAKE 1 TABLET(25 MG) BY MOUTH DAILY   warfarin (COUMADIN) 5 MG tablet TAKE 1 TO 1 AND 1/2 TABLETS BY MOUTH DAILY AS DIRECTED BY COUMADIN CLINIC     Allergies:    Acetaminophen-codeine, Vascepa [icosapent ethyl], Amoxicillin, and Diovan [valsartan]   Social History   Socioeconomic History   Marital status: Married    Spouse name: Not on file   Number of children: Not on file   Years of education: Not on file   Highest education level: Not on file  Occupational History   Not on file  Tobacco Use   Smoking status: Former Smoker    Packs/day: 2.00    Years: 5.00    Pack years: 10.00    Types: Cigarettes    Quit date: 05/29/1966    Years since quitting: 54.2   Smokeless tobacco: Never Used  Vaping Use   Vaping Use: Never used  Substance and Sexual Activity   Alcohol use: Yes    Alcohol/week: 0.0 standard drinks    Comment: ocassional   Drug use: No   Sexual activity: Not on file  Other Topics Concern   Not on file  Social History Narrative   [place]  Retired Theatre [place]   Social Determinants of Corporate investment bankerHealth   Financial Resource Strain: Not on [employer] Corporationfile  Food Insecurity: Not on file  Transportation Needs: Not on file  Physical Activity: Not on file  Stress: Not on file  Social Connections: Not on file     Family History: The patient's family history includes Colon cancer in his mother; Heart attack in his brother and mother; Leukemia in his father.  ROS:   Please see the history of present illness.    ROS  All other systems reviewed and negative.   EKGs/Labs/Other Studies Reviewed:    The following studies were reviewed today: 2D echo 02/11/2020 IMPRESSIONS   1. Left ventricular ejection fraction, by estimation, is 40 to 45%. The  left ventricle has mildly decreased function. The left ventricle  demonstrates global hypokinesis. Left ventricular diastolic parameters are  consistent with Grade I diastolic  dysfunction (impaired relaxation).  2. Right ventricular systolic function is mildly reduced. The right  ventricular size is normal.  3. The mitral valve is abnormal. Trivial mitral valve  regurgitation.  4. The aortic valve has been repaired/replaced. Aortic valve  regurgitation is trivial. There is a 29 mm Sapien prosthetic (TAVR) valve  present in the aortic position. Echo findings are consistent with  perivalvular leak of the aortic prosthesis. Aortic  valve mean gradient measures 8.2 mmHg. Aortic valve Vmax measures 1.77  m/s. Peak gradient 12.5  mmHg. DI 0.41.  5. Aortic dilatation noted. There is borderline dilatation of the  ascending aorta, measuring 38 mm.  6. The inferior vena cava is normal in size with greater than 50%  respiratory variability, suggesting right atrial pressure of 3 mmHg.   EKG:  EKG is not ordered today.    Recent Labs: 06/25/2020: Magnesium 2.2 07/02/2020: BUN 27; Creatinine, Ser 1.36; Potassium 4.3; Sodium 137   Recent Lipid Panel    Component Value Date/Time   CHOL 129 07/07/2019 1053   CHOL 113 07/22/2014 1226   TRIG 173 (H) 07/07/2019 1053   TRIG 91 07/22/2014 1226   HDL 44 07/07/2019 1053   HDL 40 07/22/2014 1226   CHOLHDL 2.9 07/07/2019 1053   CHOLHDL 3.4 06/04/2015 1216   VLDL 39 (H) 06/04/2015 1216   LDLCALC 56 07/07/2019 1053   LDLCALC 55 07/22/2014 1226   LDLDIRECT 47.9 07/14/2013 0937    Physical Exam:    VS:  BP 119/76    Pulse 80    Ht 5\' 7"  (1.702 m)    Wt 235 lb (106.6 kg)    SpO2 97%    BMI 36.81 kg/m     Wt Readings from Last 3 Encounters:  08/17/20 235 lb (106.6 kg)  06/25/20 235 lb (106.6 kg)  02/11/20 233 lb (105.7 kg)     GEN:  Well nourished, well developed in no acute distress HEENT: Normal NECK: No JVD; No carotid bruits LYMPHATICS: No lymphadenopathy CARDIAC: RRR, no murmurs, rubs, gallops RESPIRATORY:  Clear to auscultation without rales, wheezing or rhonchi  ABDOMEN: Soft, non-tender, non-distended MUSCULOSKELETAL:  No edema; No deformity  SKIN: Warm and dry NEUROLOGIC:  Alert and oriented x 3 PSYCHIATRIC:  Normal affect   ASSESSMENT:    1. Severe aortic stenosis   2. Coronary artery  disease involving native coronary artery of native heart without angina pectoris   3. PAF (paroxysmal atrial fibrillation) (HCC)   4. Chronic combined systolic and diastolic CHF (congestive heart failure) (HCC)   5. Primary hypertension   6. Dyslipidemia    PLAN:    In order of problems listed above:  1.  Severe AS -72mm Edwards Sapien 3 THV via the TF approach on 04/08/19.  -followed in Structural heart clinic -2D echo 01/2020 with mean AVG 8.34mmHg, DI 0.41>>this is stable -reiterated need for SBE prophylaxis for dental procedures -continue ASA 81mg  daily, Imdur 30mg  daily, Carvedilol and statin  2.  ASCAD -s/p 4V CABG 2015  -Cardiac cath 03/21/19 with 3/4 patent bypass grafts. The SVG to the PDA is known to be occluded. A drug eluting stent was placed in the distal RCA. -he has not had any anginal symptoms in the past year -continue ASA 81mg  daily, statin, long acting nitrate and BB  3.  PAF -he is maintaining NSR on exam today and denies any palpitations -continue Carvedilol 25mg  BID and warfarin -denies any bleeding problems on warfarin  4.  Chronic combined S/D CHF -he does not appear volume overloaded on exam today -weight is very stable -he denies any SOB or LE edema -continue Lasix 100mg  BID -creatinine stable at 1.36 in Feb 2022  5.  HTN -BP is adequately controlled on exam today -continue Carvedilol 25mg  BID, Hydralazine 25mg  TID and spiro 25mg  daily  6.  HLD -LDL goal < 70 -LDL was 59 in Nov 2021 -continue atorvastatin 80mg  daily   Medication Adjustments/Labs and Tests Ordered: Current medicines are reviewed at length with the patient today.  Concerns regarding  medicines are outlined above.  No orders of the defined types were placed in this encounter.  No orders of the defined types were placed in this encounter.   Signed, Armanda Magic, MD  08/17/2020 2:41 PM    [facility]

## 2020-08-17 NOTE — Patient Instructions (Addendum)
Description   Continue taking Warfarin 1 tablet everyday except 1.5 tablets on Mondays, Wednesdays, and Fridays.  Recheck INR in 5 weeks. Coumadin clinic [phone].

## 2020-08-17 NOTE — Patient Instructions (Addendum)
Medication Instructions:  Your physician recommends that you continue on your current medications as directed. Please refer to the Current Medication list given to you today.  *If you need a refill on your cardiac medications before your next appointment, please call your pharmacy*  Follow-Up: At [facility], you and your health needs are our priority.  As part of our continuing mission to provide you with exceptional heart care, we have created designated Provider Care Teams.  These Care Teams include your primary Cardiologist (physician) and Advanced Practice Providers (APPs -  Physician Assistants and Nurse Practitioners) who all work together to provide you with the care you need, when you need it.  Your next appointment:   6 months  The format for your next appointment:   In Person  Provider:     Ronie Spies, PA-C  Jacolyn Reedy, PA-C    Follow up with Dr. [name] in one year

## 2020-08-26 DIAGNOSIS — E1169 Type 2 diabetes mellitus with other specified complication: Secondary | ICD-10-CM | POA: Diagnosis not present

## 2020-08-26 DIAGNOSIS — K219 Gastro-esophageal reflux disease without esophagitis: Secondary | ICD-10-CM | POA: Diagnosis not present

## 2020-08-26 DIAGNOSIS — I4891 Unspecified atrial fibrillation: Secondary | ICD-10-CM | POA: Diagnosis not present

## 2020-08-26 DIAGNOSIS — I130 Hypertensive heart and chronic kidney disease with heart failure and stage 1 through stage 4 chronic kidney disease, or unspecified chronic kidney disease: Secondary | ICD-10-CM | POA: Diagnosis not present

## 2020-08-26 DIAGNOSIS — G8929 Other chronic pain: Secondary | ICD-10-CM | POA: Diagnosis not present

## 2020-08-26 DIAGNOSIS — I509 Heart failure, unspecified: Secondary | ICD-10-CM | POA: Diagnosis not present

## 2020-08-26 DIAGNOSIS — E782 Mixed hyperlipidemia: Secondary | ICD-10-CM | POA: Diagnosis not present

## 2020-08-26 DIAGNOSIS — M1990 Unspecified osteoarthritis, unspecified site: Secondary | ICD-10-CM | POA: Diagnosis not present

## 2020-08-26 DIAGNOSIS — I10 Essential (primary) hypertension: Secondary | ICD-10-CM | POA: Diagnosis not present

## 2020-08-26 DIAGNOSIS — E1122 Type 2 diabetes mellitus with diabetic chronic kidney disease: Secondary | ICD-10-CM | POA: Diagnosis not present

## 2020-09-18 ENCOUNTER — Other Ambulatory Visit: Payer: Self-pay | Admitting: Cardiology

## 2020-09-21 ENCOUNTER — Other Ambulatory Visit: Payer: Self-pay

## 2020-09-21 ENCOUNTER — Ambulatory Visit (INDEPENDENT_AMBULATORY_CARE_PROVIDER_SITE_OTHER): Payer: Medicare Other

## 2020-09-21 DIAGNOSIS — Z5181 Encounter for therapeutic drug level monitoring: Secondary | ICD-10-CM | POA: Diagnosis not present

## 2020-09-21 DIAGNOSIS — I480 Paroxysmal atrial fibrillation: Secondary | ICD-10-CM

## 2020-09-21 LAB — POCT INR: INR: 2.7 (ref 2.0–3.0)

## 2020-09-21 NOTE — Patient Instructions (Signed)
Description   Continue taking Warfarin 1 tablet everyday except 1.5 tablets on Mondays, Wednesdays, and Fridays. Recheck INR in 6 weeks. Coumadin clinic #[phone].     

## 2020-10-06 DIAGNOSIS — Z952 Presence of prosthetic heart valve: Secondary | ICD-10-CM | POA: Diagnosis not present

## 2020-10-06 DIAGNOSIS — I130 Hypertensive heart and chronic kidney disease with heart failure and stage 1 through stage 4 chronic kidney disease, or unspecified chronic kidney disease: Secondary | ICD-10-CM | POA: Diagnosis not present

## 2020-10-06 DIAGNOSIS — D6869 Other thrombophilia: Secondary | ICD-10-CM | POA: Diagnosis not present

## 2020-10-06 DIAGNOSIS — K219 Gastro-esophageal reflux disease without esophagitis: Secondary | ICD-10-CM | POA: Diagnosis not present

## 2020-10-06 DIAGNOSIS — M1711 Unilateral primary osteoarthritis, right knee: Secondary | ICD-10-CM | POA: Diagnosis not present

## 2020-10-06 DIAGNOSIS — E1169 Type 2 diabetes mellitus with other specified complication: Secondary | ICD-10-CM | POA: Diagnosis not present

## 2020-10-06 DIAGNOSIS — E1122 Type 2 diabetes mellitus with diabetic chronic kidney disease: Secondary | ICD-10-CM | POA: Diagnosis not present

## 2020-10-06 DIAGNOSIS — Z23 Encounter for immunization: Secondary | ICD-10-CM | POA: Diagnosis not present

## 2020-10-06 DIAGNOSIS — J302 Other seasonal allergic rhinitis: Secondary | ICD-10-CM | POA: Diagnosis not present

## 2020-11-02 ENCOUNTER — Ambulatory Visit: Payer: Medicare Other | Admitting: *Deleted

## 2020-11-02 ENCOUNTER — Ambulatory Visit (INDEPENDENT_AMBULATORY_CARE_PROVIDER_SITE_OTHER): Payer: Medicare Other

## 2020-11-02 ENCOUNTER — Other Ambulatory Visit: Payer: Self-pay

## 2020-11-02 DIAGNOSIS — I480 Paroxysmal atrial fibrillation: Secondary | ICD-10-CM

## 2020-11-02 DIAGNOSIS — Z5181 Encounter for therapeutic drug level monitoring: Secondary | ICD-10-CM

## 2020-11-02 DIAGNOSIS — I255 Ischemic cardiomyopathy: Secondary | ICD-10-CM

## 2020-11-02 LAB — POCT INR: INR: 2.7 (ref 2.0–3.0)

## 2020-11-02 NOTE — Patient Instructions (Signed)
Description   Continue taking Warfarin 1 tablet everyday except 1.5 tablets on Mondays, Wednesdays, and Fridays. Recheck INR in 6 weeks. Coumadin clinic #[phone].     

## 2020-11-03 DIAGNOSIS — Z961 Presence of intraocular lens: Secondary | ICD-10-CM | POA: Diagnosis not present

## 2020-11-03 DIAGNOSIS — H26493 Other secondary cataract, bilateral: Secondary | ICD-10-CM | POA: Diagnosis not present

## 2020-11-03 DIAGNOSIS — H43812 Vitreous degeneration, left eye: Secondary | ICD-10-CM | POA: Diagnosis not present

## 2020-11-03 LAB — CUP PACEART REMOTE DEVICE CHECK
Battery Impedance: 1666 Ohm
Battery Remaining Longevity: 45 mo
Battery Voltage: 2.76 V
Brady Statistic AP VP Percent: 0 %
Brady Statistic AP VS Percent: 72 %
Brady Statistic AS VP Percent: 0 %
Brady Statistic AS VS Percent: 28 %
Date Time Interrogation Session: 20220607121037
Implantable Lead Implant Date: 20020926
Implantable Lead Implant Date: 20020926
Implantable Lead Location: 753859
Implantable Lead Location: 753860
Implantable Lead Model: 5076
Implantable Lead Model: 5092
Implantable Pulse Generator Implant Date: 20120905
Lead Channel Impedance Value: 512 Ohm
Lead Channel Impedance Value: 983 Ohm
Lead Channel Pacing Threshold Amplitude: 0.5 V
Lead Channel Pacing Threshold Amplitude: 1.25 V
Lead Channel Pacing Threshold Pulse Width: 0.4 ms
Lead Channel Pacing Threshold Pulse Width: 0.4 ms
Lead Channel Setting Pacing Amplitude: 2 V
Lead Channel Setting Pacing Amplitude: 2.5 V
Lead Channel Setting Pacing Pulse Width: 0.4 ms
Lead Channel Setting Sensing Sensitivity: 4 mV

## 2020-11-09 ENCOUNTER — Other Ambulatory Visit: Payer: Self-pay | Admitting: Cardiology

## 2020-11-09 DIAGNOSIS — G43109 Migraine with aura, not intractable, without status migrainosus: Secondary | ICD-10-CM | POA: Diagnosis not present

## 2020-11-09 DIAGNOSIS — E119 Type 2 diabetes mellitus without complications: Secondary | ICD-10-CM | POA: Diagnosis not present

## 2020-11-09 DIAGNOSIS — H35413 Lattice degeneration of retina, bilateral: Secondary | ICD-10-CM | POA: Diagnosis not present

## 2020-11-09 DIAGNOSIS — Z961 Presence of intraocular lens: Secondary | ICD-10-CM | POA: Diagnosis not present

## 2020-11-09 DIAGNOSIS — H26493 Other secondary cataract, bilateral: Secondary | ICD-10-CM | POA: Diagnosis not present

## 2020-11-09 DIAGNOSIS — H43812 Vitreous degeneration, left eye: Secondary | ICD-10-CM | POA: Diagnosis not present

## 2020-11-09 DIAGNOSIS — H527 Unspecified disorder of refraction: Secondary | ICD-10-CM | POA: Diagnosis not present

## 2020-11-12 DIAGNOSIS — H26493 Other secondary cataract, bilateral: Secondary | ICD-10-CM | POA: Diagnosis not present

## 2020-11-19 DIAGNOSIS — H26491 Other secondary cataract, right eye: Secondary | ICD-10-CM | POA: Diagnosis not present

## 2020-11-23 NOTE — Progress Notes (Signed)
Remote pacemaker transmission.   

## 2020-12-07 DIAGNOSIS — M1990 Unspecified osteoarthritis, unspecified site: Secondary | ICD-10-CM | POA: Diagnosis not present

## 2020-12-07 DIAGNOSIS — G8929 Other chronic pain: Secondary | ICD-10-CM | POA: Diagnosis not present

## 2020-12-07 DIAGNOSIS — E1122 Type 2 diabetes mellitus with diabetic chronic kidney disease: Secondary | ICD-10-CM | POA: Diagnosis not present

## 2020-12-07 DIAGNOSIS — E782 Mixed hyperlipidemia: Secondary | ICD-10-CM | POA: Diagnosis not present

## 2020-12-07 DIAGNOSIS — I2510 Atherosclerotic heart disease of native coronary artery without angina pectoris: Secondary | ICD-10-CM | POA: Diagnosis not present

## 2020-12-07 DIAGNOSIS — I10 Essential (primary) hypertension: Secondary | ICD-10-CM | POA: Diagnosis not present

## 2020-12-07 DIAGNOSIS — E1169 Type 2 diabetes mellitus with other specified complication: Secondary | ICD-10-CM | POA: Diagnosis not present

## 2020-12-07 DIAGNOSIS — I4891 Unspecified atrial fibrillation: Secondary | ICD-10-CM | POA: Diagnosis not present

## 2020-12-07 DIAGNOSIS — I509 Heart failure, unspecified: Secondary | ICD-10-CM | POA: Diagnosis not present

## 2020-12-07 DIAGNOSIS — K219 Gastro-esophageal reflux disease without esophagitis: Secondary | ICD-10-CM | POA: Diagnosis not present

## 2020-12-07 DIAGNOSIS — N183 Chronic kidney disease, stage 3 unspecified: Secondary | ICD-10-CM | POA: Diagnosis not present

## 2020-12-07 DIAGNOSIS — I130 Hypertensive heart and chronic kidney disease with heart failure and stage 1 through stage 4 chronic kidney disease, or unspecified chronic kidney disease: Secondary | ICD-10-CM | POA: Diagnosis not present

## 2020-12-10 DIAGNOSIS — M109 Gout, unspecified: Secondary | ICD-10-CM | POA: Diagnosis not present

## 2020-12-14 ENCOUNTER — Ambulatory Visit: Payer: Medicare Other | Admitting: *Deleted

## 2020-12-14 ENCOUNTER — Other Ambulatory Visit: Payer: Self-pay

## 2020-12-14 DIAGNOSIS — Z5181 Encounter for therapeutic drug level monitoring: Secondary | ICD-10-CM | POA: Diagnosis not present

## 2020-12-14 DIAGNOSIS — I480 Paroxysmal atrial fibrillation: Secondary | ICD-10-CM

## 2020-12-14 LAB — POCT INR: INR: 2.3 (ref 2.0–3.0)

## 2020-12-14 NOTE — Patient Instructions (Signed)
Description   Continue taking Warfarin 1 tablet everyday except 1.5 tablets on Mondays, Wednesdays, and Fridays. Recheck INR in 6 weeks. Coumadin clinic #[phone].     

## 2020-12-15 DIAGNOSIS — M79674 Pain in right toe(s): Secondary | ICD-10-CM | POA: Diagnosis not present

## 2020-12-23 DIAGNOSIS — H26493 Other secondary cataract, bilateral: Secondary | ICD-10-CM | POA: Diagnosis not present

## 2020-12-23 DIAGNOSIS — H43812 Vitreous degeneration, left eye: Secondary | ICD-10-CM | POA: Diagnosis not present

## 2020-12-23 DIAGNOSIS — Z961 Presence of intraocular lens: Secondary | ICD-10-CM | POA: Diagnosis not present

## 2021-01-17 DIAGNOSIS — I2510 Atherosclerotic heart disease of native coronary artery without angina pectoris: Secondary | ICD-10-CM | POA: Diagnosis not present

## 2021-01-17 DIAGNOSIS — M1990 Unspecified osteoarthritis, unspecified site: Secondary | ICD-10-CM | POA: Diagnosis not present

## 2021-01-17 DIAGNOSIS — E1122 Type 2 diabetes mellitus with diabetic chronic kidney disease: Secondary | ICD-10-CM | POA: Diagnosis not present

## 2021-01-17 DIAGNOSIS — E782 Mixed hyperlipidemia: Secondary | ICD-10-CM | POA: Diagnosis not present

## 2021-01-17 DIAGNOSIS — N183 Chronic kidney disease, stage 3 unspecified: Secondary | ICD-10-CM | POA: Diagnosis not present

## 2021-01-17 DIAGNOSIS — E1169 Type 2 diabetes mellitus with other specified complication: Secondary | ICD-10-CM | POA: Diagnosis not present

## 2021-01-17 DIAGNOSIS — I10 Essential (primary) hypertension: Secondary | ICD-10-CM | POA: Diagnosis not present

## 2021-01-17 DIAGNOSIS — I5042 Chronic combined systolic (congestive) and diastolic (congestive) heart failure: Secondary | ICD-10-CM | POA: Diagnosis not present

## 2021-01-17 DIAGNOSIS — M1711 Unilateral primary osteoarthritis, right knee: Secondary | ICD-10-CM | POA: Diagnosis not present

## 2021-01-17 DIAGNOSIS — K219 Gastro-esophageal reflux disease without esophagitis: Secondary | ICD-10-CM | POA: Diagnosis not present

## 2021-01-17 DIAGNOSIS — I130 Hypertensive heart and chronic kidney disease with heart failure and stage 1 through stage 4 chronic kidney disease, or unspecified chronic kidney disease: Secondary | ICD-10-CM | POA: Diagnosis not present

## 2021-01-17 DIAGNOSIS — I480 Paroxysmal atrial fibrillation: Secondary | ICD-10-CM | POA: Diagnosis not present

## 2021-01-25 ENCOUNTER — Ambulatory Visit: Payer: Medicare Other | Admitting: *Deleted

## 2021-01-25 ENCOUNTER — Other Ambulatory Visit: Payer: Self-pay

## 2021-01-25 DIAGNOSIS — I480 Paroxysmal atrial fibrillation: Secondary | ICD-10-CM

## 2021-01-25 DIAGNOSIS — Z5181 Encounter for therapeutic drug level monitoring: Secondary | ICD-10-CM | POA: Diagnosis not present

## 2021-01-25 LAB — POCT INR: INR: 2.3 (ref 2.0–3.0)

## 2021-01-25 NOTE — Patient Instructions (Signed)
Description   Continue taking Warfarin 1 tablet everyday except 1.5 tablets on Mondays, Wednesdays, and Fridays. Recheck INR in 6 weeks. Coumadin clinic #[phone].     

## 2021-01-28 ENCOUNTER — Other Ambulatory Visit: Payer: Self-pay | Admitting: Cardiology

## 2021-01-28 NOTE — Telephone Encounter (Signed)
Prescription refill request received for warfarin  Lov:  Turner, 08/17/2020 Next INR check: 01/25/2021 Warfarin tablet strength: 5 mg   Refill sent.

## 2021-02-01 ENCOUNTER — Ambulatory Visit (INDEPENDENT_AMBULATORY_CARE_PROVIDER_SITE_OTHER): Payer: Medicare Other

## 2021-02-01 DIAGNOSIS — I255 Ischemic cardiomyopathy: Secondary | ICD-10-CM

## 2021-02-01 LAB — CUP PACEART REMOTE DEVICE CHECK
Battery Impedance: 1724 Ohm
Battery Remaining Longevity: 44 mo
Battery Voltage: 2.75 V
Brady Statistic AP VP Percent: 0 %
Brady Statistic AP VS Percent: 71 %
Brady Statistic AS VP Percent: 0 %
Brady Statistic AS VS Percent: 29 %
Date Time Interrogation Session: 20220906113416
Implantable Lead Implant Date: 20020926
Implantable Lead Implant Date: 20020926
Implantable Lead Location: 753859
Implantable Lead Location: 753860
Implantable Lead Model: 5076
Implantable Lead Model: 5092
Implantable Pulse Generator Implant Date: 20120905
Lead Channel Impedance Value: 1052 Ohm
Lead Channel Impedance Value: 527 Ohm
Lead Channel Pacing Threshold Amplitude: 0.625 V
Lead Channel Pacing Threshold Amplitude: 1 V
Lead Channel Pacing Threshold Pulse Width: 0.4 ms
Lead Channel Pacing Threshold Pulse Width: 0.4 ms
Lead Channel Setting Pacing Amplitude: 2 V
Lead Channel Setting Pacing Amplitude: 2.5 V
Lead Channel Setting Pacing Pulse Width: 0.4 ms
Lead Channel Setting Sensing Sensitivity: 4 mV

## 2021-02-02 NOTE — Progress Notes (Signed)
Cardiology Office Note    Date:  02/15/2021   ID:  Francee GentileJames F Nagorski, DOB 03-25-1941, MRN [id number]   PCP:  Sigmund HazelMiller, Lisa, MD   [facility]  Cardiologist:  Armanda Magicraci Turner, MD   Advanced Practice Provider:  No care team member to display Electrophysiologist:  None   [phone] [id number]   Chief Complaint  Patient presents with   Follow-up     History of Present Illness:  Brandon Klein is a 80 y.o. male with history of CAD status post CABG times 08/2013, DES to the distal RCA 03/21/2019, chronic systolic CHF, nonischemic cardiomyopathy last echo 02/11/20 EF 40-45%, hypertension, HLD, PAF on Coumadin, CKD stage II, OSA, severe aortic stenosis status post TAVR 04/08/2019, permanent pacemaker for bradycardia.  Patient last saw Dr. Mayford Knifeurner 08/17/2020 and was doing well.  Patient turned 80 in July. Denies chest pain, dyspnea. Does cardio 4-5 times/week. Has had urinary/urgency and  freq with lasix. Can't take when he drives. Had trouble with gout in July. Has gained some weight. Has had migraines and vertigo. Also has dizziness if he lays on his right side that improves with change of position.     Past Medical History:  Diagnosis Date   Chronic renal insufficiency    Coronary artery disease    a. s/p PCI of LAD 1997. b. Cutting balloon angioplasty to LCx in 2002. c. s/p cath 11/2013 with severe 3 vessel ASCAD s/p CABG with LIMA to LAD, SVG to diag, SVG to left circ and SVG to PDA. d. cath 07/07/2014 occluded SVG to PDA, all other grafts patent. 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  with benign gastric polypectomy 05/2014.   HLD (hyperlipidemia)    HTN (hypertension)    Obesity    OSA (obstructive sleep apnea)    Pacemaker    a. 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 b. MDT generator replacement 2012.   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 vasovagal with documented bradycardia.   Type 2 diabetes mellitus without complications (HCC) 12/02/2015    Past Surgical History:  Procedure Laterality Date   ANGIOPLASTY  1997   CHOLECYSTECTOMY     COLONOSCOPY WITH PROPOFOL N/A 05/05/2014   Procedure: COLONOSCOPY WITH PROPOFOL;  Surgeon: Charolett BumpersMartin K Johnson, MD;  Location: [facility] ENDOSCOPY;  Service: Endoscopy;  Laterality: N/A;   CORONARY ARTERY BYPASS GRAFT N/A 12/01/2013   Procedure: CORONARY ARTERY BYPASS GRAFT TIMES FOUR USING LEFT INTERNAL MAMMARY ARTERY TO LAD, SAPHENOUS VEIN GRAFTS TO DIAGONAL, CIRCUMFELX AND POSTERIOR DESCENDING;  Surgeon: Kerin PernaPeter Van Trigt, MD;  Location: [facility] OR;  Service: Open Heart Surgery;  Laterality: N/A;   CORONARY STENT INTERVENTION N/A 03/21/2019   Procedure: CORONARY STENT INTERVENTION;  Surgeon: Kathleene HazelMcAlhany, Christopher D, MD;  Location: [facility] INVASIVE CV LAB;  Service: Cardiovascular;  Laterality: N/A;  Distal RCA   ESOPHAGOGASTRODUODENOSCOPY (EGD) WITH PROPOFOL N/A 05/05/2014   Procedure: ESOPHAGOGASTRODUODENOSCOPY (EGD) WITH PROPOFOL;  Surgeon:  Charolett Bumpers, MD;  Location: [facility];  Service: Endoscopy;  Laterality: N/A;   KNEE ARTHROSCOPY  2000   Left   LEFT AND RIGHT HEART CATHETERIZATION WITH CORONARY/GRAFT ANGIOGRAM N/A 07/07/2014   Procedure: LEFT AND RIGHT HEART CATHETERIZATION WITH Isabel Caprice;  Surgeon: Kathleene Hazel, MD;  Location: [facility] CATH LAB;  Service: Cardiovascular;  Laterality: N/A;   LEFT HEART CATHETERIZATION WITH CORONARY ANGIOGRAM N/A 11/25/2013   Procedure: LEFT HEART CATHETERIZATION WITH CORONARY ANGIOGRAM;  Surgeon: [name], MD;  Location: [facility] CATH  LAB;  Service: Cardiovascular;  Laterality: N/A;   PACEMAKER INSERTION  2002   generator change MDT by Dr Graciela Husbands in 2012   RIGHT/LEFT HEART CATH AND CORONARY/GRAFT ANGIOGRAPHY N/A 03/21/2019   Procedure: RIGHT/LEFT HEART CATH AND CORONARY/GRAFT ANGIOGRAPHY;  Surgeon: Kathleene Hazel, MD;  Location: [facility] INVASIVE CV LAB;  Service: Cardiovascular;  Laterality: N/A;   TEE WITHOUT CARDIOVERSION N/A 08/31/2014   Procedure: TRANSESOPHAGEAL ECHOCARDIOGRAM (TEE);  Surgeon: [name], MD;  Location: [facility] ENDOSCOPY;  Service: Cardiovascular;  Laterality: N/A;   TEE WITHOUT CARDIOVERSION N/A 04/08/2019   Procedure: TRANSESOPHAGEAL ECHOCARDIOGRAM (TEE);  Surgeon: Kathleene Hazel, MD;  Location: [facility] OR;  Service: Open Heart Surgery;  Laterality: N/A;   TRANSCATHETER AORTIC VALVE REPLACEMENT, TRANSFEMORAL  04/08/2019   TRANSCATHETER AORTIC VALVE REPLACEMENT, TRANSFEMORAL N/A 04/08/2019   Procedure: TRANSCATHETER AORTIC VALVE REPLACEMENT, TRANSFEMORAL;  Surgeon: Kathleene Hazel, MD;  Location: [facility] OR;  Service: Open Heart Surgery;  Laterality: N/A;    Current Medications: Current Meds  Medication Sig   Acetaminophen (TYLENOL ARTHRITIS PAIN PO) Take 1,300 mg by mouth 3 (three) times daily as needed.   aspirin 81 MG EC tablet Take 81 mg by mouth daily. Swallow whole.   atorvastatin (LIPITOR) 80 MG tablet TAKE 1 TABLET BY MOUTH  DAILY   azithromycin (ZITHROMAX) 500 MG tablet Take 1 tablet (500 mg total) by mouth as directed. Take one tablet 1 hour before any dental work including cleanings.   carvedilol (COREG) 25 MG tablet TAKE 1 TABLET(25 MG) BY MOUTH TWICE DAILY WITH A MEAL   docusate sodium (COLACE) 100 MG capsule Take 200 mg by mouth daily as needed for mild constipation.    fexofenadine (ALLEGRA) 180 MG tablet Take 180 mg by mouth daily.    fluticasone (FLONASE) 50 MCG/ACT nasal spray Place 2 sprays into the nose daily.    furosemide (LASIX) 20 MG tablet TAKE 1 TABLET BY MOUTH TWICE  DAILY WITH 80 MG TABLET   furosemide (LASIX) 80 MG tablet TAKE 1 TABLET BY MOUTH TWICE DAILY WITH 20MG  TABLET   Glucosamine-Chondroitin 500-400 MG CAPS Take 1 tablet by mouth daily.   hydrALAZINE (APRESOLINE) 25 MG tablet TAKE 1 TABLET(25 MG) BY MOUTH THREE TIMES DAILY   isosorbide mononitrate (IMDUR) 30 MG 24 hr tablet TAKE 1 TABLET(30 MG) BY MOUTH DAILY   L-Lysine 1000 MG TABS Take 1,000 mg by mouth daily.    meclizine (ANTIVERT) 25 MG tablet Take 25 mg by mouth daily as needed for dizziness.   Multiple Vitamin (MULTIVITAMIN) tablet Take 1 tablet by mouth daily. Centrum silver   nitroGLYCERIN (NITROSTAT) 0.4 MG SL tablet Place 1 tablet (0.4 mg total) under the tongue every 5 (five) minutes as needed for chest pain.   ONE TOUCH ULTRA TEST test strip 1 each by Other route every morning.    ONETOUCH DELICA LANCETS FINE MISC 1 each by Other route every morning.    pantoprazole (PROTONIX) 40 MG tablet Take  40 mg by mouth daily.    potassium chloride SA (KLOR-CON) 20 MEQ tablet TAKE 2 TABLETS(40 MEQ) BY MOUTH TWICE DAILY   RA NATURAL MAGNESIUM 250 MG TABS TAKE 2 TABLETS TWICE DAILY.   spironolactone (ALDACTONE) 25 MG tablet TAKE 1 TABLET(25 MG) BY MOUTH DAILY   warfarin (COUMADIN) 5 MG tablet TAKE 1 TO 1 AND 1/2 TABLETS BY MOUTH DAILY AS DIRECTED BY COUMADIN CLINIC     Allergies:   Acetaminophen-codeine, Vascepa [icosapent ethyl], Amoxicillin, Codeine, and Diovan [valsartan]   Social History   Socioeconomic History   Marital status: Married    Spouse name: Not on file   Number of children: Not on file   Years of education: Not on file   Highest education level: Not on file  Occupational History   Not on file  Tobacco Use   Smoking status: Former    Packs/day: 2.00    Years: 5.00    Pack years: 10.00    Types: Cigarettes    Quit date: 05/29/1966    Years since quitting: 54.7   Smokeless tobacco: Never  Vaping Use   Vaping Use: Never used  Substance and Sexual Activity   Alcohol  use: Yes    Alcohol/week: 0.0 standard drinks    Comment: ocassional   Drug use: No   Sexual activity: Not on file  Other Topics Concern   Not on file  Social History Narrative   Lives in [place] with spouse.  Retired Theatre manager of [employer] Strain: Not on [employer] Insecurity: Not on file  Transportation Needs: Not on file  Physical Activity: Not on file  Stress: Not on file  Social Connections: Not on file     Family History:  The patient's  family history includes Colon cancer in his mother; Heart attack in his brother and mother; Leukemia in his father.   ROS:   Please see the history of present illness.    ROS All other systems reviewed and are negative.   PHYSICAL EXAM:   VS:  BP 132/76   Pulse 86   Ht 5\' 7"  (1.702 m)   Wt 238 lb 9.6 oz (108.2 kg)   SpO2 94%   BMI 37.37 kg/m   Physical Exam  GEN: Obese, in no acute distress  Neck: no JVD, carotid bruits, or masses Cardiac:RRR; no murmurs, rubs, or gallops  Respiratory:  clear to auscultation bilaterally, normal work of breathing GI: soft, nontender, nondistended, + BS Ext: without cyanosis, clubbing, or edema, Good distal pulses bilaterally Neuro:  Alert and Oriented x 3 Psych: euthymic mood, full affect  Wt Readings from Last 3 Encounters:  02/15/21 238 lb 9.6 oz (108.2 kg)  08/17/20 235 lb (106.6 kg)  06/25/20 235 lb (106.6 kg)      Studies/Labs Reviewed:   EKG:  EKG is not ordered today.     Recent Labs: 06/25/2020: Magnesium 2.2 07/02/2020: BUN 27; Creatinine, Ser 1.36; Potassium 4.3; Sodium 137   Lipid Panel    Component Value Date/Time   CHOL 129 07/07/2019 1053   CHOL 113 07/22/2014 1226   TRIG 173 (H) 07/07/2019 1053   TRIG 91 07/22/2014 1226   HDL 44 07/07/2019 1053   HDL 40 07/22/2014 1226   CHOLHDL 2.9 07/07/2019 1053   CHOLHDL 3.4 06/04/2015 1216   VLDL 39 (H) 06/04/2015 1216   LDLCALC 56 07/07/2019 1053   LDLCALC 55 07/22/2014 1226    LDLDIRECT 47.9 07/14/2013 8119  Additional studies/ records that were reviewed today include:  2D echo 02/11/2020 IMPRESSIONS    1. Left ventricular ejection fraction, by estimation, is 40 to 45%. The  left ventricle has mildly decreased function. The left ventricle  demonstrates global hypokinesis. Left ventricular diastolic parameters are  consistent with Grade I diastolic  dysfunction (impaired relaxation).   2. Right ventricular systolic function is mildly reduced. The right  ventricular size is normal.   3. The mitral valve is abnormal. Trivial mitral valve regurgitation.   4. The aortic valve has been repaired/replaced. Aortic valve  regurgitation is trivial. There is a 29 mm Sapien prosthetic (TAVR) valve  present in the aortic position. Echo findings are consistent with  perivalvular leak of the aortic prosthesis. Aortic  valve mean gradient measures 8.2 mmHg. Aortic valve Vmax measures 1.77  m/s. Peak gradient 12.5 mmHg. DI 0.41.   5. Aortic dilatation noted. There is borderline dilatation of the  ascending aorta, measuring 38 mm.   6. The inferior vena cava is normal in size with greater than 50%  respiratory variability, suggesting right atrial pressure of 3 mmHg.      Risk Assessment/Calculations:    CHA2DS2-VASc Score = 5   This indicates a 7.2% annual risk of stroke. The patient's score is based upon: CHF History: 1 HTN History: 1 Diabetes History: 0 Stroke History: 0 Vascular Disease History: 1 Age Score: 2 Gender Score: 0       ASSESSMENT:    1. Severe aortic stenosis   2. Coronary artery disease involving native coronary artery of native heart without angina pectoris   3. PAF (paroxysmal atrial fibrillation) (HCC)   4. Chronic combined systolic and diastolic CHF (congestive heart failure) (HCC)   5. Essential hypertension   6. Hyperlipidemia, unspecified hyperlipidemia type      PLAN:  In order of problems listed above:  Severe AS status  post TAVR 04/08/2019 2D echo 01/2020 mean AV gradient 8.5 mmHg.  Needs SBE prophylaxis for dental procedures  CAD status post CABG times 08/2013, cardiac cath 03/20/2022 3/4 bypass grafts patent SVG to PDA known to be occluded, DES placed to the distal RCA, no angina  PAF on Coumadin and carvedilol  Chronic combined systolic and diastolic CHF on Lasix 100 mg twice daily but urinary freq. Will try to decrease to 80 mg bid. Watch weight and 2 gm sodium. LVEF 40-45% 01/2020  Hypertension controlled  HLD LDL 59 03/2020-PCP to do labs in Nov  Shared Decision Making/Informed Consent        Medication Adjustments/Labs and Tests Ordered: Current medicines are reviewed at length with the patient today.  Concerns regarding medicines are outlined above.  Medication changes, Labs and Tests ordered today are listed in the Patient Instructions below. Patient Instructions  Medication Instructions:  Decrease Lasix to 80 mg twice a day. If your weight increases 2-3 pounds in a day or 5 pounds in a week, increase back to 100 mg twice a day.   *If you need a refill on your cardiac medications before your next appointment, please call your pharmacy*   Lab Work: None ordered   If you have labs (blood work) drawn today and your tests are completely normal, you will receive your results only by: MyChart Message (if you have MyChart) OR A paper copy in the mail If you have any lab test that is abnormal or we need to change your treatment, we will call you to review the results.   Testing/Procedures: None ordered  Follow-Up: At [facility], you and your health needs are our priority.  As part of our continuing mission to provide you with exceptional heart care, we have created designated Provider Care Teams.  These Care Teams include your primary Cardiologist (physician) and Advanced Practice Providers (APPs -  Physician Assistants and Nurse Practitioners) who all work together to provide you with  the care you need, when you need it.  We recommend signing up for the patient portal called "MyChart".  Sign up information is provided on this After Visit Summary.  MyChart is used to connect with patients for Virtual Visits (Telemedicine).  Patients are able to view lab/test results, encounter notes, upcoming appointments, etc.  Non-urgent messages can be sent to your provider as well.   To learn more about what you can do with MyChart, go to [email or web address].    Your next appointment:   6 month(s)  The format for your next appointment:   In Person  Provider:   You may see Armanda Magic, MD or one of the following Advanced Practice Providers on your designated Care Team:   Ronie Spies, PA-C Jacolyn Reedy, PA-C   Other Instructions Two Gram Sodium Diet 2000 mg  What is Sodium? Sodium is a mineral found naturally in many foods. The most significant source of sodium in the diet is table salt, which is about 40% sodium.  Processed, convenience, and preserved foods also contain a large amount of sodium.  The body needs only 500 mg of sodium daily to function,  A normal diet provides more than enough sodium even if you do not use salt.  Why Limit Sodium? A build up of sodium in the body can cause thirst, increased blood pressure, shortness of breath, and water retention.  Decreasing sodium in the diet can reduce edema and risk of heart attack or stroke associated with high blood pressure.  Keep in mind that there are many other factors involved in these health problems.  Heredity, obesity, lack of exercise, cigarette smoking, stress and what you eat all play a role.  General Guidelines: Do not add salt at the table or in cooking.  One teaspoon of salt contains over 2 grams of sodium. Read food labels Avoid processed and convenience foods Ask your dietitian before eating any foods not dicussed in the menu planning guidelines Consult your physician if you wish to use a salt substitute or  a sodium containing medication such as antacids.  Limit milk and milk products to 16 oz (2 cups) per day.  Shopping Hints: READ LABELS!! "Dietetic" does not necessarily mean low sodium. Salt and other sodium ingredients are often added to foods during processing.    Menu Planning Guidelines Food Group Choose More Often Avoid  Beverages (see also the milk group All fruit juices, low-sodium, salt-free vegetables juices, low-sodium carbonated beverages Regular vegetable or tomato juices, commercially softened water used for drinking or cooking  Breads and Cereals Enriched white, wheat, rye and pumpernickel bread, hard rolls and dinner rolls; muffins, cornbread and waffles; most dry cereals, cooked cereal without added salt; unsalted crackers and breadsticks; low sodium or homemade bread crumbs Bread, rolls and crackers with salted tops; quick breads; instant hot cereals; pancakes; commercial bread stuffing; self-rising flower and biscuit mixes; regular bread crumbs or cracker crumbs  Desserts and Sweets Desserts and sweets mad with mild should be within allowance Instant pudding mixes and cake mixes  Fats Butter or margarine; vegetable oils; unsalted salad dressings, regular salad dressings limited to 1  Tbs; light, sour and heavy cream Regular salad dressings containing bacon fat, bacon bits, and salt pork; snack dips made with instant soup mixes or processed cheese; salted nuts  Fruits Most fresh, frozen and canned fruits Fruits processed with salt or sodium-containing ingredient (some dried fruits are processed with sodium sulfites        Vegetables Fresh, frozen vegetables and low- sodium canned vegetables Regular canned vegetables, sauerkraut, pickled vegetables, and others prepared in brine; frozen vegetables in sauces; vegetables seasoned with ham, bacon or salt pork  Condiments, Sauces, Miscellaneous  Salt substitute with physician's approval; pepper, herbs, spices; vinegar, lemon or lime  juice; hot pepper sauce; garlic powder, onion powder, low sodium soy sauce (1 Tbs.); low sodium condiments (ketchup, chili sauce, mustard) in limited amounts (1 tsp.) fresh ground horseradish; unsalted tortilla chips, pretzels, potato chips, popcorn, salsa (1/4 cup) Any seasoning made with salt including garlic salt, celery salt, onion salt, and seasoned salt; sea salt, rock salt, kosher salt; meat tenderizers; monosodium glutamate; mustard, regular soy sauce, barbecue, sauce, chili sauce, teriyaki sauce, steak sauce, Worcestershire sauce, and most flavored vinegars; canned gravy and mixes; regular condiments; salted snack foods, olives, picles, relish, horseradish sauce, catsup   Food preparation: Try these seasonings Meats:    Pork Sage, onion Serve with applesauce  Chicken Poultry seasoning, thyme, parsley Serve with cranberry sauce  Lamb Curry powder, rosemary, garlic, thyme Serve with mint sauce or jelly  Veal Marjoram, basil Serve with current jelly, cranberry sauce  Beef Pepper, bay leaf Serve with dry mustard, unsalted chive butter  Fish [place] leaf, dill Serve with unsalted lemon butter, unsalted parsley butter  Vegetables:    Asparagus Lemon juice   Broccoli Lemon juice   Carrots Mustard dressing parsley, mint, nutmeg, glazed with unsalted butter and sugar   Green beans Marjoram, lemon juice, nutmeg,dill seed   Tomatoes Basil, marjoram, onion   Spice /blend for [employer]" 4 tsp ground thyme 1 tsp ground sage 3 tsp ground rosemary 4 tsp ground marjoram   Test your knowledge A product that says "Salt Free" may still contain sodium. True or False Garlic Powder and Hot Pepper Sauce an be used as alternative seasonings.True or False Processed foods have more sodium than fresh foods.  True or False Canned Vegetables have less sodium than froze True or False   WAYS TO DECREASE YOUR SODIUM INTAKE Avoid the use of added salt in cooking and at the table.  Table salt (and other prepared  seasonings which contain salt) is probably one of the greatest sources of sodium in the diet.  Unsalted foods can gain flavor from the sweet, sour, and butter taste sensations of herbs and spices.  Instead of using salt for seasoning, try the following seasonings with the foods listed.  Remember: how you use them to enhance natural food flavors is limited only by your creativity... Allspice-Meat, fish, eggs, fruit, peas, red and yellow vegetables Almond Extract-Fruit baked goods Anise Seed-Sweet breads, fruit, carrots, beets, cottage cheese, cookies (tastes like licorice) Basil-Meat, fish, eggs, vegetables, rice, vegetables salads, soups, sauces Bay Leaf-Meat, fish, stews, poultry Burnet-Salad, vegetables (cucumber-like flavor) Caraway Seed-Bread, cookies, cottage cheese, meat, vegetables, cheese, rice Cardamon-Baked goods, fruit, soups Celery Powder or seed-Salads, salad dressings, sauces, meatloaf, soup, bread.Do not use  celery salt Chervil-Meats, salads, fish, eggs, vegetables, cottage cheese (parsley-like flavor) Chili Power-Meatloaf, chicken cheese, corn, eggplant, egg dishes Chives-Salads cottage cheese, egg dishes, soups, vegetables, sauces Cilantro-Salsa, casseroles Cinnamon-Baked goods, fruit, pork, lamb, chicken, carrots Cloves-Fruit, baked  goods, fish, pot roast, green beans, beets, carrots Coriander-Pastry, cookies, meat, salads, cheese (lemon-orange flavor) Cumin-Meatloaf, fish,cheese, eggs, cabbage,fruit pie (caraway flavor) [employer], fruit, eggs, fish, poultry, cottage cheese, vegetables Dill Seed-Meat, cottage cheese, poultry, vegetables, fish, salads, bread Fennel Seed-Bread, cookies, apples, pork, eggs, fish, beets, cabbage, cheese, Licorice-like flavor Garlic-(buds or powder) Salads, meat, poultry, fish, bread, butter, vegetables, potatoes.Do not  use garlic salt Ginger-Fruit, vegetables, baked goods, meat, fish, poultry Horseradish Root-Meet, vegetables,  butter Lemon Juice or Extract-Vegetables, fruit, tea, baked goods, fish salads Mace-Baked goods fruit, vegetables, fish, poultry (taste like nutmeg) Maple Extract-Syrups Marjoram-Meat, chicken, fish, vegetables, breads, green salads (taste like Sage) Mint-Tea, lamb, sherbet, vegetables, desserts, carrots, cabbage Mustard, Dry or Seed-Cheese, eggs, meats, vegetables, poultry Nutmeg-Baked goods, fruit, chicken, eggs, vegetables, desserts Onion Powder-Meat, fish, poultry, vegetables, cheese, eggs, bread, rice salads (Do not use   Onion salt) Orange Extract-Desserts, baked goods Oregano-Pasta, eggs, cheese, onions, pork, lamb, fish, chicken, vegetables, green salads Paprika-Meat, fish, poultry, eggs, cheese, vegetables Parsley Flakes-Butter, vegetables, meat fish, poultry, eggs, bread, salads (certain forms may   Contain sodium Pepper-Meat fish, poultry, vegetables, eggs Peppermint Extract-Desserts, baked goods Poppy Seed-Eggs, bread, cheese, fruit dressings, baked goods, noodles, vegetables, cottage  [employer], poultry, meat, fish, cauliflower, turnips,eggs bread Saffron-Rice, bread, veal, chicken, fish, eggs Sage-Meat, fish, poultry, onions, eggplant, tomateos, pork, stews Savory-Eggs, salads, poultry, meat, rice, vegetables, soups, pork Tarragon-Meat, poultry, fish, eggs, butter, vegetables (licorice-like flavor)  Thyme-Meat, poultry, fish, eggs, vegetables, (clover-like flavor), sauces, soups Tumeric-Salads, butter, eggs, fish, rice, vegetables (saffron-like flavor) Vanilla Extract-Baked goods, candy Vinegar-Salads, vegetables, meat marinades Walnut Extract-baked goods, candy   2. Choose your Foods Wisely   The following is a list of foods to avoid which are high in sodium:  Meats-Avoid all smoked, canned, salt cured, dried and kosher meat and fish as well as Anchovies   [employer] meats:Bologna, Liverwurst, Pastrami Canned  meat or fish  Marinated herring Caviar    Pepperoni Corned Beef   Pizza Dried chipped beef  Salami Frozen breaded fish or meat Salt pork Frankfurters or hot dogs  Sardines Gefilte fish   Sausage Ham (boiled ham, Proscuitto Smoked butt    spiced ham)   Spam      TV Dinners Vegetables Canned vegetables (Regular) Relish Canned mushrooms  Sauerkraut Olives    Tomato juice Pickles  Bakery and Dessert Products Canned puddings  Cream pies Cheesecake   Decorated cakes Cookies  Beverages/Juices Tomato juice, regular  Gatorade   V-8 vegetable juice, regular  Breads and Cereals Biscuit mixes   Salted potato chips, corn chips, pretzels Bread stuffing mixes  Salted crackers and rolls Pancake and waffle mixes Self-rising flour  Seasonings Accent    Meat sauces Barbecue sauce  Meat tenderizer Catsup    Monosodium glutamate (MSG) Celery salt   Onion salt Chili sauce   Prepared mustard Garlic salt   Salt, seasoned salt, sea salt Gravy mixes   Soy sauce Horseradish   Steak sauce Ketchup   Tartar sauce Lite salt    Teriyaki sauce Marinade mixes   Worcestershire sauce  Others Baking powder   Cocoa and cocoa mixes Baking soda   Commercial casserole mixes Candy-caramels, chocolate  Dehydrated soups    Bars, fudge,nougats  Instant rice and pasta mixes Canned broth or soup  Maraschino cherries Cheese, aged and processed cheese and cheese spreads  Learning Assessment Quiz  Indicated T (for True) or F (for False) for each of the following  statements:  _____ Fresh fruits and vegetables and unprocessed grains are generally low in sodium _____ Water may contain a considerable amount of sodium, depending on the source _____ You can always tell if a food is high in sodium by tasting it _____ Certain laxatives my be high in sodium and should be avoided unless prescribed   by a physician or pharmacist _____ Salt substitutes may be used freely by anyone on a sodium restricted  diet _____ Sodium is present in table salt, food additives and as a natural component of   most foods _____ Table salt is approximately 90% sodium _____ Limiting sodium intake may help prevent excess fluid accumulation in the body _____ On a sodium-restricted diet, seasonings such as bouillon soy sauce, and    cooking wine should be used in place of table salt _____ On an ingredient list, a product which lists monosodium glutamate as the first   ingredient is an appropriate food to include on a low sodium diet  Circle the best answer(s) to the following statements (Hint: there may be more than one correct answer)  11. On a low-sodium diet, some acceptable snack items are:    A. Olives  F. Bean dip   K. Grapefruit juice    B. Salted Pretzels G. Commercial Popcorn   L. Canned peaches    C. Carrot Sticks  H. Bouillon   M. Unsalted nuts   D. [place] fries  I. Peanut butter crackers N. Salami   E. Sweet pickles J. Tomato Juice   O. Pizza  12.  Seasonings that may be used freely on a reduced - sodium diet include   A. Lemon wedges F.Monosodium glutamate K. Celery seed    B.Soysauce   G. Pepper   L. Mustard powder   C. Sea salt  H. Cooking wine  M. Onion flakes   D. Vinegar  E. Prepared horseradish N. Salsa   E. Sage   J. Worcestershire sauce  O. Chutney     Signed, Jacolyn Reedy, PA-C  02/15/2021 1:51 PM    [facility] [address] [place], [place], [place]  [address] Phone: [phone]; Fax: [phone]

## 2021-02-09 NOTE — Progress Notes (Signed)
Remote pacemaker transmission.   

## 2021-02-15 ENCOUNTER — Encounter: Payer: Self-pay | Admitting: Physician Assistant

## 2021-02-15 ENCOUNTER — Ambulatory Visit: Payer: Medicare Other | Admitting: Physician Assistant

## 2021-02-15 ENCOUNTER — Other Ambulatory Visit: Payer: Self-pay

## 2021-02-15 VITALS — BP 132/76 | HR 86 | Ht 67.0 in | Wt 238.6 lb

## 2021-02-15 DIAGNOSIS — I480 Paroxysmal atrial fibrillation: Secondary | ICD-10-CM | POA: Diagnosis not present

## 2021-02-15 DIAGNOSIS — I5042 Chronic combined systolic (congestive) and diastolic (congestive) heart failure: Secondary | ICD-10-CM

## 2021-02-15 DIAGNOSIS — I350 Nonrheumatic aortic (valve) stenosis: Secondary | ICD-10-CM

## 2021-02-15 DIAGNOSIS — E785 Hyperlipidemia, unspecified: Secondary | ICD-10-CM

## 2021-02-15 DIAGNOSIS — I2510 Atherosclerotic heart disease of native coronary artery without angina pectoris: Secondary | ICD-10-CM | POA: Diagnosis not present

## 2021-02-15 DIAGNOSIS — I10 Essential (primary) hypertension: Secondary | ICD-10-CM

## 2021-02-15 NOTE — Patient Instructions (Addendum)
Medication Instructions:  Decrease Lasix to 80 mg twice a day. If your weight increases 2-3 pounds in a day or 5 pounds in a week, increase back to 100 mg twice a day.   *If you need a refill on your cardiac medications before your next appointment, please call your pharmacy*   Lab Work: None ordered   If you have labs (blood work) drawn today and your tests are completely normal, you will receive your results only by: MyChart Message (if you have MyChart) OR A paper copy in the mail If you have any lab test that is abnormal or we need to change your treatment, we will call you to review the results.   Testing/Procedures: None ordered    Follow-Up: At [facility], you and your health needs are our priority.  As part of our continuing mission to provide you with exceptional heart care, we have created designated Provider Care Teams.  These Care Teams include your primary Cardiologist (physician) and Advanced Practice Providers (APPs -  Physician Assistants and Nurse Practitioners) who all work together to provide you with the care you need, when you need it.  We recommend signing up for the patient portal called "MyChart".  Sign up information is provided on this After Visit Summary.  MyChart is used to connect with patients for Virtual Visits (Telemedicine).  Patients are able to view lab/test results, encounter notes, upcoming appointments, etc.  Non-urgent messages can be sent to your provider as well.   To learn more about what you can do with MyChart, go to [email or web address].    Your next appointment:   6 month(s)  The format for your next appointment:   In Person  Provider:   You may see Armanda Magic, MD or one of the following Advanced Practice Providers on your designated Care Team:   Ronie Spies, PA-C Jacolyn Reedy, PA-C   Other Instructions Two Gram Sodium Diet 2000 mg  What is Sodium? Sodium is a mineral found naturally in many foods. The most significant  source of sodium in the diet is table salt, which is about 40% sodium.  Processed, convenience, and preserved foods also contain a large amount of sodium.  The body needs only 500 mg of sodium daily to function,  A normal diet provides more than enough sodium even if you do not use salt.  Why Limit Sodium? A build up of sodium in the body can cause thirst, increased blood pressure, shortness of breath, and water retention.  Decreasing sodium in the diet can reduce edema and risk of heart attack or stroke associated with high blood pressure.  Keep in mind that there are many other factors involved in these health problems.  Heredity, obesity, lack of exercise, cigarette smoking, stress and what you eat all play a role.  General Guidelines: Do not add salt at the table or in cooking.  One teaspoon of salt contains over 2 grams of sodium. Read food labels Avoid processed and convenience foods Ask your dietitian before eating any foods not dicussed in the menu planning guidelines Consult your physician if you wish to use a salt substitute or a sodium containing medication such as antacids.  Limit milk and milk products to 16 oz (2 cups) per day.  Shopping Hints: READ LABELS!! "Dietetic" does not necessarily mean low sodium. Salt and other sodium ingredients are often added to foods during processing.    Menu Planning Guidelines Food Group Choose More Often Avoid  Beverages (see also the  milk group All fruit juices, low-sodium, salt-free vegetables juices, low-sodium carbonated beverages Regular vegetable or tomato juices, commercially softened water used for drinking or cooking  Breads and Cereals Enriched white, wheat, rye and pumpernickel bread, hard rolls and dinner rolls; muffins, cornbread and waffles; most dry cereals, cooked cereal without added salt; unsalted crackers and breadsticks; low sodium or homemade bread crumbs Bread, rolls and crackers with salted tops; quick breads; instant hot  cereals; pancakes; commercial bread stuffing; self-rising flower and biscuit mixes; regular bread crumbs or cracker crumbs  Desserts and Sweets Desserts and sweets mad with mild should be within allowance Instant pudding mixes and cake mixes  Fats Butter or margarine; vegetable oils; unsalted salad dressings, regular salad dressings limited to 1 Tbs; light, sour and heavy cream Regular salad dressings containing bacon fat, bacon bits, and salt pork; snack dips made with instant soup mixes or processed cheese; salted nuts  Fruits Most fresh, frozen and canned fruits Fruits processed with salt or sodium-containing ingredient (some dried fruits are processed with sodium sulfites        Vegetables Fresh, frozen vegetables and low- sodium canned vegetables Regular canned vegetables, sauerkraut, pickled vegetables, and others prepared in brine; frozen vegetables in sauces; vegetables seasoned with ham, bacon or salt pork  Condiments, Sauces, Miscellaneous  Salt substitute with physician's approval; pepper, herbs, spices; vinegar, lemon or lime juice; hot pepper sauce; garlic powder, onion powder, low sodium soy sauce (1 Tbs.); low sodium condiments (ketchup, chili sauce, mustard) in limited amounts (1 tsp.) fresh ground horseradish; unsalted tortilla chips, pretzels, potato chips, popcorn, salsa (1/4 cup) Any seasoning made with salt including garlic salt, celery salt, onion salt, and seasoned salt; sea salt, rock salt, kosher salt; meat tenderizers; monosodium glutamate; mustard, regular soy sauce, barbecue, sauce, chili sauce, teriyaki sauce, steak sauce, Worcestershire sauce, and most flavored vinegars; canned gravy and mixes; regular condiments; salted snack foods, olives, picles, relish, horseradish sauce, catsup   Food preparation: Try these seasonings Meats:    Pork Sage, onion Serve with applesauce  Chicken Poultry seasoning, thyme, parsley Serve with cranberry sauce  Lamb Curry powder, rosemary,  garlic, thyme Serve with mint sauce or jelly  Veal Marjoram, basil Serve with current jelly, cranberry sauce  Beef Pepper, bay leaf Serve with dry mustard, unsalted chive butter  Fish [place] leaf, dill Serve with unsalted lemon butter, unsalted parsley butter  Vegetables:    Asparagus Lemon juice   Broccoli Lemon juice   Carrots Mustard dressing parsley, mint, nutmeg, glazed with unsalted butter and sugar   Green beans Marjoram, lemon juice, nutmeg,dill seed   Tomatoes Basil, marjoram, onion   Spice /blend for [employer]" 4 tsp ground thyme 1 tsp ground sage 3 tsp ground rosemary 4 tsp ground marjoram   Test your knowledge A product that says "Salt Free" may still contain sodium. True or False Garlic Powder and Hot Pepper Sauce an be used as alternative seasonings.True or False Processed foods have more sodium than fresh foods.  True or False Canned Vegetables have less sodium than froze True or False   WAYS TO DECREASE YOUR SODIUM INTAKE Avoid the use of added salt in cooking and at the table.  Table salt (and other prepared seasonings which contain salt) is probably one of the greatest sources of sodium in the diet.  Unsalted foods can gain flavor from the sweet, sour, and butter taste sensations of herbs and spices.  Instead of using salt for seasoning, try the following seasonings with the  foods listed.  Remember: how you use them to enhance natural food flavors is limited only by your creativity... Allspice-Meat, fish, eggs, fruit, peas, red and yellow vegetables Almond Extract-Fruit baked goods Anise Seed-Sweet breads, fruit, carrots, beets, cottage cheese, cookies (tastes like licorice) Basil-Meat, fish, eggs, vegetables, rice, vegetables salads, soups, sauces Bay Leaf-Meat, fish, stews, poultry Burnet-Salad, vegetables (cucumber-like flavor) Caraway Seed-Bread, cookies, cottage cheese, meat, vegetables, cheese, rice Cardamon-Baked goods, fruit, soups Celery Powder or  seed-Salads, salad dressings, sauces, meatloaf, soup, bread.Do not use  celery salt Chervil-Meats, salads, fish, eggs, vegetables, cottage cheese (parsley-like flavor) Chili Power-Meatloaf, chicken cheese, corn, eggplant, egg dishes Chives-Salads cottage cheese, egg dishes, soups, vegetables, sauces Cilantro-Salsa, casseroles Cinnamon-Baked goods, fruit, pork, lamb, chicken, carrots Cloves-Fruit, baked goods, fish, pot roast, green beans, beets, carrots Coriander-Pastry, cookies, meat, salads, cheese (lemon-orange flavor) Cumin-Meatloaf, fish,cheese, eggs, cabbage,fruit pie (caraway flavor) [employer], fruit, eggs, fish, poultry, cottage cheese, vegetables Dill Seed-Meat, cottage cheese, poultry, vegetables, fish, salads, bread Fennel Seed-Bread, cookies, apples, pork, eggs, fish, beets, cabbage, cheese, Licorice-like flavor Garlic-(buds or powder) Salads, meat, poultry, fish, bread, butter, vegetables, potatoes.Do not  use garlic salt Ginger-Fruit, vegetables, baked goods, meat, fish, poultry Horseradish Root-Meet, vegetables, butter Lemon Juice or Extract-Vegetables, fruit, tea, baked goods, fish salads Mace-Baked goods fruit, vegetables, fish, poultry (taste like nutmeg) Maple Extract-Syrups Marjoram-Meat, chicken, fish, vegetables, breads, green salads (taste like Sage) Mint-Tea, lamb, sherbet, vegetables, desserts, carrots, cabbage Mustard, Dry or Seed-Cheese, eggs, meats, vegetables, poultry Nutmeg-Baked goods, fruit, chicken, eggs, vegetables, desserts Onion Powder-Meat, fish, poultry, vegetables, cheese, eggs, bread, rice salads (Do not use   Onion salt) Orange Extract-Desserts, baked goods Oregano-Pasta, eggs, cheese, onions, pork, lamb, fish, chicken, vegetables, green salads Paprika-Meat, fish, poultry, eggs, cheese, vegetables Parsley Flakes-Butter, vegetables, meat fish, poultry, eggs, bread, salads (certain forms may   Contain sodium Pepper-Meat fish, poultry,  vegetables, eggs Peppermint Extract-Desserts, baked goods Poppy Seed-Eggs, bread, cheese, fruit dressings, baked goods, noodles, vegetables, cottage  [employer], poultry, meat, fish, cauliflower, turnips,eggs bread Saffron-Rice, bread, veal, chicken, fish, eggs Sage-Meat, fish, poultry, onions, eggplant, tomateos, pork, stews Savory-Eggs, salads, poultry, meat, rice, vegetables, soups, pork Tarragon-Meat, poultry, fish, eggs, butter, vegetables (licorice-like flavor)  Thyme-Meat, poultry, fish, eggs, vegetables, (clover-like flavor), sauces, soups Tumeric-Salads, butter, eggs, fish, rice, vegetables (saffron-like flavor) Vanilla Extract-Baked goods, candy Vinegar-Salads, vegetables, meat marinades Walnut Extract-baked goods, candy   2. Choose your Foods Wisely   The following is a list of foods to avoid which are high in sodium:  Meats-Avoid all smoked, canned, salt cured, dried and kosher meat and fish as well as Anchovies   [employer] meats:Bologna, Liverwurst, Pastrami Canned meat or fish  Marinated herring Caviar    Pepperoni Corned Beef   Pizza Dried chipped beef  Salami Frozen breaded fish or meat Salt pork Frankfurters or hot dogs  Sardines Gefilte fish   Sausage Ham (boiled ham, Proscuitto Smoked butt    spiced ham)   Spam      TV Dinners Vegetables Canned vegetables (Regular) Relish Canned mushrooms  Sauerkraut Olives    Tomato juice Pickles  Bakery and Dessert Products Canned puddings  Cream pies Cheesecake   Decorated cakes Cookies  Beverages/Juices Tomato juice, regular  Gatorade   V-8 vegetable juice, regular  Breads and Cereals Biscuit mixes   Salted potato chips, corn chips, pretzels Bread stuffing mixes  Salted crackers and rolls Pancake and waffle mixes Self-rising flour  Seasonings Accent    Meat sauces Barbecue sauce  Meat tenderizer Catsup    Monosodium glutamate (MSG) Celery salt   Onion  salt Chili sauce   Prepared mustard Garlic salt   Salt, seasoned salt, sea salt Gravy mixes   Soy sauce Horseradish   Steak sauce Ketchup   Tartar sauce Lite salt    Teriyaki sauce Marinade mixes   Worcestershire sauce  Others Baking powder   Cocoa and cocoa mixes Baking soda   Commercial casserole mixes Candy-caramels, chocolate  Dehydrated soups    Bars, fudge,nougats  Instant rice and pasta mixes Canned broth or soup  Maraschino cherries Cheese, aged and processed cheese and cheese spreads  Learning Assessment Quiz  Indicated T (for True) or F (for False) for each of the following statements:  _____ Fresh fruits and vegetables and unprocessed grains are generally low in sodium _____ Water may contain a considerable amount of sodium, depending on the source _____ You can always tell if a food is high in sodium by tasting it _____ Certain laxatives my be high in sodium and should be avoided unless prescribed   by a physician or pharmacist _____ Salt substitutes may be used freely by anyone on a sodium restricted diet _____ Sodium is present in table salt, food additives and as a natural component of   most foods _____ Table salt is approximately 90% sodium _____ Limiting sodium intake may help prevent excess fluid accumulation in the body _____ On a sodium-restricted diet, seasonings such as bouillon soy sauce, and    cooking wine should be used in place of table salt _____ On an ingredient list, a product which lists monosodium glutamate as the first   ingredient is an appropriate food to include on a low sodium diet  Circle the best answer(s) to the following statements (Hint: there may be more than one correct answer)  11. On a low-sodium diet, some acceptable snack items are:    A. Olives  F. Bean dip   K. Grapefruit juice    B. Salted Pretzels G. Commercial Popcorn   L. Canned peaches    C. Carrot Sticks  H. Bouillon   M. Unsalted nuts   D. [place] fries  I. Peanut  butter crackers N. Salami   E. Sweet pickles J. Tomato Juice   O. Pizza  12.  Seasonings that may be used freely on a reduced - sodium diet include   A. Lemon wedges F.Monosodium glutamate K. Celery seed    B.Soysauce   G. Pepper   L. Mustard powder   C. Sea salt  H. Cooking wine  M. Onion flakes   D. Vinegar  E. Prepared horseradish N. Salsa   E. Sage   J. Worcestershire sauce  O. Chutney

## 2021-03-08 ENCOUNTER — Other Ambulatory Visit: Payer: Self-pay

## 2021-03-08 ENCOUNTER — Ambulatory Visit: Payer: Medicare Other | Admitting: *Deleted

## 2021-03-08 DIAGNOSIS — I480 Paroxysmal atrial fibrillation: Secondary | ICD-10-CM

## 2021-03-08 DIAGNOSIS — Z5181 Encounter for therapeutic drug level monitoring: Secondary | ICD-10-CM

## 2021-03-08 LAB — POCT INR: INR: 3 (ref 2.0–3.0)

## 2021-03-08 NOTE — Patient Instructions (Signed)
Description   Continue taking Warfarin 1 tablet everyday except 1.5 tablets on Mondays, Wednesdays, and Fridays. Please have your salad today and remain consistent. Recheck INR in 6 weeks. Coumadin clinic [phone].

## 2021-03-13 DIAGNOSIS — N183 Chronic kidney disease, stage 3 unspecified: Secondary | ICD-10-CM | POA: Diagnosis not present

## 2021-03-13 DIAGNOSIS — E782 Mixed hyperlipidemia: Secondary | ICD-10-CM | POA: Diagnosis not present

## 2021-03-13 DIAGNOSIS — M1990 Unspecified osteoarthritis, unspecified site: Secondary | ICD-10-CM | POA: Diagnosis not present

## 2021-03-13 DIAGNOSIS — I130 Hypertensive heart and chronic kidney disease with heart failure and stage 1 through stage 4 chronic kidney disease, or unspecified chronic kidney disease: Secondary | ICD-10-CM | POA: Diagnosis not present

## 2021-03-13 DIAGNOSIS — M1711 Unilateral primary osteoarthritis, right knee: Secondary | ICD-10-CM | POA: Diagnosis not present

## 2021-03-13 DIAGNOSIS — K219 Gastro-esophageal reflux disease without esophagitis: Secondary | ICD-10-CM | POA: Diagnosis not present

## 2021-03-13 DIAGNOSIS — E1122 Type 2 diabetes mellitus with diabetic chronic kidney disease: Secondary | ICD-10-CM | POA: Diagnosis not present

## 2021-03-13 DIAGNOSIS — G8929 Other chronic pain: Secondary | ICD-10-CM | POA: Diagnosis not present

## 2021-03-13 DIAGNOSIS — E1169 Type 2 diabetes mellitus with other specified complication: Secondary | ICD-10-CM | POA: Diagnosis not present

## 2021-03-13 DIAGNOSIS — I10 Essential (primary) hypertension: Secondary | ICD-10-CM | POA: Diagnosis not present

## 2021-03-13 DIAGNOSIS — I2510 Atherosclerotic heart disease of native coronary artery without angina pectoris: Secondary | ICD-10-CM | POA: Diagnosis not present

## 2021-03-13 DIAGNOSIS — I480 Paroxysmal atrial fibrillation: Secondary | ICD-10-CM | POA: Diagnosis not present

## 2021-03-17 ENCOUNTER — Other Ambulatory Visit: Payer: Self-pay | Admitting: Physician Assistant

## 2021-04-07 DIAGNOSIS — E1122 Type 2 diabetes mellitus with diabetic chronic kidney disease: Secondary | ICD-10-CM | POA: Diagnosis not present

## 2021-04-07 DIAGNOSIS — N183 Chronic kidney disease, stage 3 unspecified: Secondary | ICD-10-CM | POA: Diagnosis not present

## 2021-04-07 DIAGNOSIS — D692 Other nonthrombocytopenic purpura: Secondary | ICD-10-CM | POA: Diagnosis not present

## 2021-04-07 DIAGNOSIS — Z0000 Encounter for general adult medical examination without abnormal findings: Secondary | ICD-10-CM | POA: Diagnosis not present

## 2021-04-07 DIAGNOSIS — Z23 Encounter for immunization: Secondary | ICD-10-CM | POA: Diagnosis not present

## 2021-04-07 DIAGNOSIS — K219 Gastro-esophageal reflux disease without esophagitis: Secondary | ICD-10-CM | POA: Diagnosis not present

## 2021-04-07 DIAGNOSIS — I480 Paroxysmal atrial fibrillation: Secondary | ICD-10-CM | POA: Diagnosis not present

## 2021-04-19 ENCOUNTER — Other Ambulatory Visit: Payer: Self-pay

## 2021-04-19 ENCOUNTER — Ambulatory Visit: Payer: Medicare Other

## 2021-04-19 DIAGNOSIS — I480 Paroxysmal atrial fibrillation: Secondary | ICD-10-CM | POA: Diagnosis not present

## 2021-04-19 DIAGNOSIS — Z5181 Encounter for therapeutic drug level monitoring: Secondary | ICD-10-CM | POA: Diagnosis not present

## 2021-04-19 LAB — POCT INR: INR: 2.4 (ref 2.0–3.0)

## 2021-04-19 NOTE — Patient Instructions (Signed)
Description   Continue taking Warfarin 1 tablet everyday except 1.5 tablets on Mondays, Wednesdays, and Fridays. Please have your salad today and remain consistent. Recheck INR in 6 weeks. Coumadin clinic [phone].

## 2021-05-03 ENCOUNTER — Ambulatory Visit (INDEPENDENT_AMBULATORY_CARE_PROVIDER_SITE_OTHER): Payer: Medicare Other

## 2021-05-03 DIAGNOSIS — I255 Ischemic cardiomyopathy: Secondary | ICD-10-CM | POA: Diagnosis not present

## 2021-05-03 LAB — CUP PACEART REMOTE DEVICE CHECK
Battery Impedance: 1929 Ohm
Battery Remaining Longevity: 39 mo
Battery Voltage: 2.75 V
Brady Statistic AP VP Percent: 0 %
Brady Statistic AP VS Percent: 72 %
Brady Statistic AS VP Percent: 0 %
Brady Statistic AS VS Percent: 27 %
Date Time Interrogation Session: 20221206113543
Implantable Lead Implant Date: 20020926
Implantable Lead Implant Date: 20020926
Implantable Lead Location: 753859
Implantable Lead Location: 753860
Implantable Lead Model: 5076
Implantable Lead Model: 5092
Implantable Pulse Generator Implant Date: 20120905
Lead Channel Impedance Value: 1005 Ohm
Lead Channel Impedance Value: 490 Ohm
Lead Channel Pacing Threshold Amplitude: 0.625 V
Lead Channel Pacing Threshold Amplitude: 1.125 V
Lead Channel Pacing Threshold Pulse Width: 0.4 ms
Lead Channel Pacing Threshold Pulse Width: 0.4 ms
Lead Channel Setting Pacing Amplitude: 2 V
Lead Channel Setting Pacing Amplitude: 2.5 V
Lead Channel Setting Pacing Pulse Width: 0.4 ms
Lead Channel Setting Sensing Sensitivity: 4 mV

## 2021-05-05 ENCOUNTER — Other Ambulatory Visit: Payer: Self-pay | Admitting: Cardiology

## 2021-05-05 NOTE — Telephone Encounter (Signed)
Prescription refill request received for warfarin Lov: 02/15/21 Geni Bers)  Next INR check: 05/31/21 Warfarin tablet strength: 5mg   Appropriate dose and refill sent to requested pharmacy.

## 2021-05-11 NOTE — Progress Notes (Signed)
Remote pacemaker transmission.   

## 2021-05-31 ENCOUNTER — Telehealth: Payer: Self-pay

## 2021-05-31 ENCOUNTER — Other Ambulatory Visit: Payer: Self-pay

## 2021-05-31 ENCOUNTER — Ambulatory Visit: Payer: Medicare Other

## 2021-05-31 DIAGNOSIS — I480 Paroxysmal atrial fibrillation: Secondary | ICD-10-CM | POA: Diagnosis not present

## 2021-05-31 DIAGNOSIS — Z5181 Encounter for therapeutic drug level monitoring: Secondary | ICD-10-CM | POA: Diagnosis not present

## 2021-05-31 LAB — POCT INR: INR: 2.9 (ref 2.0–3.0)

## 2021-05-31 NOTE — Telephone Encounter (Signed)
I spoke with the patient and asked him for a manual transmission. He agreed to send one later today or tomorrow.

## 2021-05-31 NOTE — Telephone Encounter (Signed)
-----   Message from Deboraha Sprang, MD sent at 05/30/2021  4:34 PM EST ----- Device remote reviewed. Remote is normal.  Battery status is good.  Lead measurements unchanged.  Histograms are appropriate.   LADIES  can you get him to transmit and see if still in afib and IF YES, bring me the report in clinic Thanks SK

## 2021-05-31 NOTE — Telephone Encounter (Signed)
The patient states he will send a transmission later today or tomorrow.

## 2021-05-31 NOTE — Patient Instructions (Signed)
Continue taking Warfarin 1 tablet everyday except 1.5 tablets on Mondays, Wednesdays, and Fridays. Recheck INR in 6 weeks. Coumadin clinic #[phone]. 

## 2021-05-31 NOTE — Telephone Encounter (Signed)
Attempted phone call to pt and left voicemail message to contact device clinic at [phone] per Dr Odessa Fleming request for pt to send a device transmission.

## 2021-06-01 NOTE — Telephone Encounter (Signed)
Manual transmission received/ reviewed.  Pt is no longer in AF.  Episode resolved after 26 hours on 12/7.

## 2021-06-01 NOTE — Telephone Encounter (Signed)
Transmission received 05-31-2021

## 2021-06-09 ENCOUNTER — Other Ambulatory Visit: Payer: Self-pay | Admitting: Cardiology

## 2021-06-10 ENCOUNTER — Telehealth: Payer: Self-pay

## 2021-06-10 NOTE — Telephone Encounter (Signed)
-----   Message from Steven C Klein, MD sent at 05/30/2021  4:34 PM EST ----- °Device remote reviewed. °Remote is normal.  Battery status is good.  Lead measurements unchanged.  Histograms are appropriate.  ° °LADIES  can you get him to transmit and see if still in afib and IF YES, bring me the report in clinic °Thanks SK  ° °  °

## 2021-06-10 NOTE — Telephone Encounter (Signed)
Called patient to send another transmission. States he will send one today.

## 2021-06-11 ENCOUNTER — Other Ambulatory Visit: Payer: Self-pay | Admitting: Cardiology

## 2021-06-13 MED ORDER — FUROSEMIDE 80 MG PO TABS: ORAL_TABLET | ORAL | 2 refills | Status: DC

## 2021-06-13 NOTE — Telephone Encounter (Signed)
Transmission reviewed. Presenting rhythm AS/VS 71. No new episodes since 05/03/22 which lasted 28 hours and 59 min with Peak A/V >400/145. Routing to Dr. Caryl Comes as Juluis Rainier per his request.

## 2021-06-21 ENCOUNTER — Other Ambulatory Visit: Payer: Self-pay | Admitting: Cardiology

## 2021-06-25 NOTE — Telephone Encounter (Signed)
M  please let the pt know of the input from [facility] and TT and that they would like him, as long as no bleeding issues, to stay on ASA Thanks SK

## 2021-07-01 NOTE — Telephone Encounter (Signed)
Attempted phone call to pt and left voicemail message to contact RN at [phone]. 

## 2021-07-04 NOTE — Telephone Encounter (Signed)
Patient called back returning your call.

## 2021-07-04 NOTE — Telephone Encounter (Signed)
Spoke with pt and advised of recommendation per Dr Caryl Comes as below.  Pt verbalizes understanding and agrees with current plan. Pt states he received a reminder letter to schedule appointment.  Pt advised will forward to scheduler for appointment.

## 2021-07-12 ENCOUNTER — Other Ambulatory Visit: Payer: Self-pay

## 2021-07-12 ENCOUNTER — Ambulatory Visit: Payer: Medicare Other

## 2021-07-12 DIAGNOSIS — I480 Paroxysmal atrial fibrillation: Secondary | ICD-10-CM | POA: Diagnosis not present

## 2021-07-12 DIAGNOSIS — Z5181 Encounter for therapeutic drug level monitoring: Secondary | ICD-10-CM | POA: Diagnosis not present

## 2021-07-12 LAB — POCT INR: INR: 2.6 (ref 2.0–3.0)

## 2021-07-12 NOTE — Patient Instructions (Signed)
Continue taking Warfarin 1 tablet everyday except 1.5 tablets on Mondays, Wednesdays, and Fridays. Recheck INR in 6 weeks. Coumadin clinic #[phone]. 

## 2021-07-20 DIAGNOSIS — E782 Mixed hyperlipidemia: Secondary | ICD-10-CM | POA: Diagnosis not present

## 2021-07-20 DIAGNOSIS — N183 Chronic kidney disease, stage 3 unspecified: Secondary | ICD-10-CM | POA: Diagnosis not present

## 2021-07-20 DIAGNOSIS — I5042 Chronic combined systolic (congestive) and diastolic (congestive) heart failure: Secondary | ICD-10-CM | POA: Diagnosis not present

## 2021-07-20 DIAGNOSIS — E1122 Type 2 diabetes mellitus with diabetic chronic kidney disease: Secondary | ICD-10-CM | POA: Diagnosis not present

## 2021-07-20 DIAGNOSIS — K219 Gastro-esophageal reflux disease without esophagitis: Secondary | ICD-10-CM | POA: Diagnosis not present

## 2021-07-20 DIAGNOSIS — I130 Hypertensive heart and chronic kidney disease with heart failure and stage 1 through stage 4 chronic kidney disease, or unspecified chronic kidney disease: Secondary | ICD-10-CM | POA: Diagnosis not present

## 2021-07-20 DIAGNOSIS — I480 Paroxysmal atrial fibrillation: Secondary | ICD-10-CM | POA: Diagnosis not present

## 2021-07-29 ENCOUNTER — Other Ambulatory Visit: Payer: Self-pay | Admitting: Cardiology

## 2021-07-29 DIAGNOSIS — I480 Paroxysmal atrial fibrillation: Secondary | ICD-10-CM

## 2021-08-01 NOTE — Progress Notes (Signed)
Cardiology Office Note Date:  08/01/2021  Patient ID:  Brandon Klein, Brandon Klein April 18, 1941, MRN [id number] PCP:  Kathyrn Lass, MD  Cardiologist:  Dr. Radford Pax Electrophysiologist: Dr. Caryl Comes    Chief Complaint:  Annual device visit  History of Present Illness: Brandon Klein is a 81 y.o. 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 Caryl Comes, last seen by him Jan 2020.  At that visit he was doing well, no changes were made to his programming or medicines.  Since then he has undergone TAVR procedure Nov 2020.  I saw him Jan 2021 He feels well.  Mentions he had cataract surgery and this has been huge for him, sees well has had a couple aura like symptoms the eye MD told his was likely a headache,  He has felt well since his PCI and TAVR.  Denies any CP, palpitations or cardiac awareness.  No dizziness (outside of his known occasional vertigo), no near syncope or syncope.   He says his knees are his primary exertional limitations, no SOB or DOE. He will bleed more with trauma, minor skin scrapes on the Plavix with his warfarin, otherwise no bleeding or signs of bleeding.  Follows with the coumadin clinic No changes were made, planned to see Dr. Radford Pax as scheduled, remotes as usual and EP annually.  He saw K. Grandville Silos, [place] 02/12/20, doing very well, exercising regularly, lost weight.  TTE again with LVEF 40-45%, grade I DD, valve was OK.  Updated endocarditis prophylaxis with the patient, otherwise no changes.  I saw him Jan 2022 He is doing quite well. Continues to exercise at the [facility] regularly with very good exertional capacity. Home weight is stable No CP. He has PVCs that he feels, has had them for many years, uhanged behavier, here and  there No palpitations otherwise. No dizzy spells, near syncope or syncope. No bleeding or signs of bleeding PMD manages labs No changes were made, planned for labs  He saw Dr. Radford Pax March 2022, again feeling well, no changes were made.  He saw Gerrianne Scale, PA-C, Sept 2022, urinary frequency/urgency prompted him not using lasix as often and dose reduced some for him  TODAY He is doing well, when he reduced his lasix dose, his weight started to creep back up and he resumed his usual dosing.  Working on the urinary urgency and slight incontinence. No CP, denies SOB He does not think he has had any AFib, but perhaps some PVCs now and then. No near syncope or syncope. No bleeding or signs of bleeding  Had labs with his PMD Nov 2022   Device information: MDT dual chamber PPM, implanted 02/21/01, gen change 02/01/11, Dr. Lovena Le, followed by Dr. Caryl Comes   Past Medical History:  Diagnosis Date   Chronic renal insufficiency    Coronary artery disease    a. s/p PCI of LAD 1997. b. Cutting balloon angioplasty to LCx in 2002. c. s/p cath 11/2013 with severe 3 vessel ASCAD s/p CABG with LIMA to LAD, SVG to diag, SVG to left circ and SVG to PDA. d. cath 07/07/2014 occluded SVG to PDA, all other grafts patent. EF down for likely NICM   DJD (degenerative  joint disease)    Epistaxis    GERD (gastroesophageal reflux disease)    GI bleeding    a.  with benign gastric polypectomy 05/2014.   HLD (hyperlipidemia)    HTN (hypertension)    Obesity    OSA (obstructive sleep apnea)    Pacemaker    a. 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 b. MDT generator replacement 2012.   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 vasovagal with documented bradycardia.   Type 2 diabetes mellitus without complications ([place]) 0000000    Past Surgical History:  Procedure Laterality Date   ANGIOPLASTY  1997   CHOLECYSTECTOMY     COLONOSCOPY WITH PROPOFOL N/A 05/05/2014   Procedure: COLONOSCOPY WITH PROPOFOL;  Surgeon: Garlan Fair, MD;  Location: [facility];  Service: Endoscopy;  Laterality: N/A;   CORONARY ARTERY BYPASS GRAFT N/A 12/01/2013   Procedure: CORONARY ARTERY BYPASS GRAFT TIMES FOUR USING LEFT INTERNAL MAMMARY ARTERY TO LAD, SAPHENOUS VEIN GRAFTS TO DIAGONAL, CIRCUMFELX AND POSTERIOR DESCENDING;  Surgeon: Ivin Poot, MD;  Location: [facility];  Service: Open Heart Surgery;  Laterality: N/A;   CORONARY STENT INTERVENTION N/A 03/21/2019   Procedure: CORONARY STENT INTERVENTION;  Surgeon: Burnell Blanks, MD;  Location: [facility];  Service: Cardiovascular;  Laterality: N/A;  Distal RCA   ESOPHAGOGASTRODUODENOSCOPY (EGD) WITH PROPOFOL N/A 05/05/2014   Procedure: ESOPHAGOGASTRODUODENOSCOPY (EGD) WITH PROPOFOL;  Surgeon: Garlan Fair, MD;  Location: [facility];  Service: Endoscopy;  Laterality: N/A;   KNEE ARTHROSCOPY  2000   Left   LEFT AND RIGHT HEART CATHETERIZATION WITH CORONARY/GRAFT ANGIOGRAM N/A 07/07/2014   Procedure: LEFT AND RIGHT HEART CATHETERIZATION WITH Beatrix Fetters;  Surgeon: Burnell Blanks, MD;  Location: [facility] CATH LAB;  Service: Cardiovascular;  Laterality: N/A;   LEFT HEART CATHETERIZATION WITH CORONARY ANGIOGRAM N/A 11/25/2013   Procedure: LEFT HEART CATHETERIZATION WITH CORONARY ANGIOGRAM;  Surgeon: Sueanne Margarita, MD;  Location: [facility] CATH LAB;  Service: Cardiovascular;  Laterality: N/A;   PACEMAKER INSERTION  2002   generator change MDT by Dr Caryl Comes in 2012   RIGHT/LEFT HEART CATH AND CORONARY/GRAFT ANGIOGRAPHY N/A 03/21/2019   Procedure: RIGHT/LEFT HEART CATH AND CORONARY/GRAFT ANGIOGRAPHY;   Surgeon: Burnell Blanks, MD;  Location: [facility];  Service: Cardiovascular;  Laterality: N/A;   TEE WITHOUT CARDIOVERSION N/A 08/31/2014   Procedure: TRANSESOPHAGEAL ECHOCARDIOGRAM (TEE);  Surgeon: Sueanne Margarita, MD;  Location: [facility] ENDOSCOPY;  Service: Cardiovascular;  Laterality: N/A;   TEE WITHOUT CARDIOVERSION N/A 04/08/2019   Procedure: TRANSESOPHAGEAL ECHOCARDIOGRAM (TEE);  Surgeon: Burnell Blanks, MD;  Location: [facility];  Service: Open Heart Surgery;  Laterality: N/A;   TRANSCATHETER AORTIC VALVE REPLACEMENT, TRANSFEMORAL  04/08/2019   TRANSCATHETER AORTIC VALVE REPLACEMENT, TRANSFEMORAL N/A 04/08/2019   Procedure: TRANSCATHETER AORTIC VALVE REPLACEMENT, TRANSFEMORAL;  Surgeon: Burnell Blanks, MD;  Location: [facility];  Service: Open Heart Surgery;  Laterality: N/A;    Current Outpatient Medications  Medication Sig Dispense Refill   Acetaminophen (TYLENOL ARTHRITIS PAIN PO) Take 1,300 mg by mouth 3 (three) times daily as  needed.     aspirin 81 MG EC tablet Take 81 mg by mouth daily. Swallow whole.     atorvastatin (LIPITOR) 80 MG tablet TAKE 1 TABLET BY MOUTH  DAILY 90 tablet 3   azithromycin (ZITHROMAX) 500 MG tablet TAKE 1 TABLET BY MOUTH 1 HOUR BEFORE ANY DENTAL WORK INCLUDING CLEANINGS 6 tablet 6   carvedilol (COREG) 25 MG tablet TAKE 1 TABLET(25 MG) BY MOUTH TWICE DAILY WITH A MEAL 180 tablet 3   docusate sodium (COLACE) 100 MG capsule Take 200 mg by mouth daily as needed for mild constipation.      fexofenadine (ALLEGRA) 180 MG tablet Take 180 mg by mouth daily.      fluticasone (FLONASE) 50 MCG/ACT nasal spray Place 2 sprays into the nose daily.      furosemide (LASIX) 20 MG tablet TAKE 1 TABLET BY MOUTH TWICE DAILY ALONG WITH 80 MG TABLET 180 tablet 2   furosemide (LASIX) 80 MG tablet TAKE 1 TABLET BY MOUTH TWICE DAILY WITH 20 MG TABLET 180 tablet 2   Glucosamine-Chondroitin 500-400 MG CAPS Take 1 tablet by mouth daily.     hydrALAZINE (APRESOLINE) 25  MG tablet TAKE 1 TABLET(25 MG) BY MOUTH THREE TIMES DAILY 270 tablet 2   isosorbide mononitrate (IMDUR) 30 MG 24 hr tablet TAKE 1 TABLET(30 MG) BY MOUTH DAILY 90 tablet 3   L-Lysine 1000 MG TABS Take 1,000 mg by mouth daily.      meclizine (ANTIVERT) 25 MG tablet Take 25 mg by mouth daily as needed for dizziness.     Multiple Vitamin (MULTIVITAMIN) tablet Take 1 tablet by mouth daily. Centrum silver     nitroGLYCERIN (NITROSTAT) 0.4 MG SL tablet Place 1 tablet (0.4 mg total) under the tongue every 5 (five) minutes as needed for chest pain. 30 tablet 3   ONE TOUCH ULTRA TEST test strip 1 each by Other route every morning.      ONETOUCH DELICA LANCETS FINE MISC 1 each by Other route every morning.      pantoprazole (PROTONIX) 40 MG tablet Take 40 mg by mouth daily.      potassium chloride SA (KLOR-CON M) 20 MEQ tablet TAKE 2 TABLETS(40 MEQ) BY MOUTH TWICE DAILY 360 tablet 2   RA NATURAL MAGNESIUM 250 MG TABS TAKE 2 TABLETS TWICE DAILY. 120 tablet 5   spironolactone (ALDACTONE) 25 MG tablet TAKE 1 TABLET(25 MG) BY MOUTH DAILY 90 tablet 3   warfarin (COUMADIN) 5 MG tablet TAKE 1 TO 1 AND 1/2 TABLETS BY MOUTH DAILY AS DIRECTED BY COUMADIN CLINIC 120 tablet 1   No current facility-administered medications for this visit.    Allergies:   Acetaminophen-codeine, Vascepa [icosapent ethyl], Amoxicillin, Codeine, and Diovan [valsartan]   Social History:  The patient  reports that he quit smoking about 55 years ago. His smoking use included cigarettes. He has a 10.00 pack-year smoking history. He has never used smokeless tobacco. He reports current alcohol use. He reports that he does not use drugs.   Family History:  The patient's family history includes Colon cancer in his mother; Heart attack in his brother and mother; Leukemia in his father.  ROS:  Please see the history of present illness.  All other systems are reviewed and otherwise negative.   PHYSICAL EXAM:  VS:  There were no vitals taken for  this visit. BMI: There is no height or weight on file to calculate BMI. Well nourished, well developed, in no acute distress  HEENT: normocephalic, atraumatic  Neck: no JVD, carotid bruits or masses Cardiac:  RRR; no significant murmurs, no rubs, or gallops Lungs:  CTA b/l, no wheezing, rhonchi or rales  Abd: soft, non-tender, obese MS: no deformity or atrophy Ext: no edema  Skin: warm and dry, no rash Neuro:  No gross deficits appreciated Psych: euthymic mood, full affect  PPM site: stable, no tethering, skin change, edema or tenderness   EKG:  Done today and reviewed by myself AP/VS, RBBB 65bpm    PPM interrogation done today and reviewed by myself:  Battery and lead measurements are good AF burden 0.9% One Afib episode near 29hours   02/11/2020: TTE IMPRESSIONS   1. Left ventricular ejection fraction, by estimation, is 40 to 45%. The  left ventricle has mildly decreased function. The left ventricle  demonstrates global hypokinesis. Left ventricular diastolic parameters are  consistent with Grade I diastolic  dysfunction (impaired relaxation).   2. Right ventricular systolic function is mildly reduced. The right  ventricular size is normal.   3. The mitral valve is abnormal. Trivial mitral valve regurgitation.   4. The aortic valve has been repaired/replaced. Aortic valve  regurgitation is trivial. There is a 29 mm Sapien prosthetic (TAVR) valve  present in the aortic position. Echo findings are consistent with  perivalvular leak of the aortic prosthesis. Aortic  valve mean gradient measures 8.2 mmHg. Aortic valve Vmax measures 1.77  m/s. Peak gradient 12.5 mmHg. DI 0.41.   5. Aortic dilatation noted. There is borderline dilatation of the  ascending aorta, measuring 38 mm.   6. The inferior vena cava is normal in size with greater than 50%  respiratory variability, suggesting right atrial pressure of 3 mmHg.    05/15/2019 TTE IMPRESSIONS 1. Left ventricular  ejection fraction, by visual estimation, is 40 to 45%. The left ventricle has mild to moderately decreased function. There is mildly increased left ventricular hypertrophy.  2. Left ventricular diastolic parameters are consistent with Grade I diastolic dysfunction (impaired relaxation).  3. The left ventricle demonstrates global hypokinesis.  4. Global right ventricle has normal systolic function.The right ventricular size is mildly enlarged. No increase in right ventricular wall thickness.  5. Left atrial size was moderately dilated.  6. Right atrial size was moderately dilated.  7. The mitral valve is normal in structure. Mild mitral valve regurgitation. No evidence of mitral stenosis.  8. The tricuspid valve is normal in structure. Tricuspid valve regurgitation is mild.  9. Aortic valve regurgitation is not visualized. No evidence of aortic valve sclerosis or stenosis. 10. The pulmonic valve was normal in structure. Pulmonic valve regurgitation is not visualized. 11. Normal pulmonary artery systolic pressure. 12. The tricuspid regurgitant velocity is 1.98 m/s, and with an assumed right atrial pressure of 3 mmHg, the estimated right ventricular systolic pressure is normal at 18.7 mmHg. 13. A pacer wire is visualized in the RV and RA. 14. The inferior vena cava is normal in size with greater than 50% respiratory variability, suggesting right atrial pressure of 3 mmHg. 15. Normal transaortic gradients peak/mean 19/11 mmHg, trivial paravalvular leak. LVEF has decreased now 40-45% with diffuse hypokinesis.    03/21/2019: LHC/PCI Mid RCA lesion is 30% stenosed. Dist RCA-1 lesion is 90% stenosed. Dist RCA-2 lesion is 90% stenosed. SVG graft was not injected. Origin to Prox Graft lesion is 100% stenosed. Ramus lesion is 80% stenosed. Mid Cx lesion is 100% stenosed. SVG graft was visualized by angiography. SVG graft was visualized by angiography. Mid LAD lesion is 100% stenosed. A  drug-eluting  stent was successfully placed using a STENT SYNERGY DES [id number]. Post intervention, there is a 40% residual stenosis. Post intervention, there is a 0% residual stenosis.   1. 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 Known occlusion SVG to PDA. Severe stenosis distal RCA.  3. Successful PTCA/DES x 1 distal RCA.  4. Chronic occlusion mid LAD. Patent LIMA to mid LAD. Patent SVG to Diagonal 5. Chronic occlusion mid Circumflex. Patent SVG to OM.  6. Severe disease in the small to moderate caliber intermediate branch 7. Severe aortic stenosis  By echo. Cath data with mean gradient 23.3 mmHg, peak to peak gradient 32 mm Hg).    Will continue planning for TAVR. Will continue ASA, Plavix and coumadin for now. Will continue DAPT with ASA and Plavix until after his TAVR procedure.     10/216/18, lexiscan stress myoview Nuclear stress EF: 42%. Ejection fraction on echocardiogram was 45%. There was no ST segment deviation noted during stress. This is a low risk study. No perfusion defects identified. Ejection fraction is mildly reduced, consistent with echocardiogram.  02/08/17: TTE Study Conclusions - Left ventricle: Abnormal septal motion mid and basal inferior   wall hypokinesis. Systolic function was mildly reduced. The   estimated ejection fraction was in the range of 45% to 50%.   Doppler parameters are consistent with abnormal left ventricular   relaxation (grade 1 diastolic dysfunction). - Aortic valve: Gradients actually appear lower than those recorded   on 02/07/16. There was moderate stenosis. Valve area (VTI): 0.74   cm^2. Valve area (Vmax): 0.7 cm^2. Valve area (Vmean): 0.74 cm^2. - Left atrium: The atrium was moderately dilated. - Atrial septum: No defect or patent foramen ovale was identified.  02/07/16: TTE, LVEF 40% 02/23/15: TTE, LVEF 35-40% 09/28/14: TTE, LVEF 25-30%, probable severe AS, dobutamine done described as moderate  07/07/14:  LHC Impression: 1. 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 Moderately severe distal RCA stenosis, this vessel is no longer protected by the graft 3. Ischemic cardiomyopathy 4. Moderate aortic stenosis although severity may be underestimated given low LVEF Recommendations: He appears to remain volume overloaded. I would resume diuresis today. I cannot explain the drop in his LVEF by the occlusion of the graft to the PDA. His native RCA has moderately severe distal disease but good flow. This could be easily approached with PCI in the future. For now, would not recommend PCI of the RCA.   Recent Labs: No results found for requested labs within last 8760 hours.  No results found for requested labs within last 8760 hours.   CrCl cannot be calculated (Patient's most recent lab result is older than the maximum 21 days allowed.).   Wt Readings from Last 3 Encounters:  02/15/21 238 lb 9.6 oz (108.2 kg)  08/17/20 235 lb (106.6 kg)  06/25/20 235 lb (106.6 kg)     Other studies reviewed: Additional studies/records reviewed today include: summarized above  ASSESSMENT AND PLAN:  1. PPM     Intact function, no programming changes made  2. CAD     No anginal complaints     On statin, BB, nitrate     C/w Dr.[facility]  3. ICM, LVEF has improved over the years     Exam appears euvolemic, no symptoms of fluid OL     pt weighs daily, reports stable back on his usual lasix dosing     On BB, diuretic tx, no  ACE/ARB, has hx of CRI, LVEF remains 45-50%  4. Paroxysmal Afib     CHA2DS2Vasc is at least 5, on warfarin     Monitored with the coumadin clinic     0.9 % burden  5. HTN     Looks OK, no changes  6. 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 VHD     S/p TAVR Nov 2020     Echo Sept 2021 looked OK                 Disposition:  Remotes as usual, EP in clinic in a year, sooner if needed.     Current  medicines are reviewed at length with the patient today.  The patient did not have any concerns regarding medicines.  Venetia Night, PA-C 08/01/2021 1:12 PM     [address] [place] [address] [phone] (office)  [phone] (fax)

## 2021-08-02 ENCOUNTER — Ambulatory Visit (INDEPENDENT_AMBULATORY_CARE_PROVIDER_SITE_OTHER): Payer: Medicare Other

## 2021-08-02 DIAGNOSIS — I255 Ischemic cardiomyopathy: Secondary | ICD-10-CM | POA: Diagnosis not present

## 2021-08-02 LAB — CUP PACEART REMOTE DEVICE CHECK
Battery Impedance: 2016 Ohm
Battery Remaining Longevity: 37 mo
Battery Voltage: 2.75 V
Brady Statistic AP VP Percent: 0 %
Brady Statistic AP VS Percent: 73 %
Brady Statistic AS VP Percent: 0 %
Brady Statistic AS VS Percent: 26 %
Date Time Interrogation Session: 20230307114321
Implantable Lead Implant Date: 20020926
Implantable Lead Implant Date: 20020926
Implantable Lead Location: 753859
Implantable Lead Location: 753860
Implantable Lead Model: 5076
Implantable Lead Model: 5092
Implantable Pulse Generator Implant Date: 20120905
Lead Channel Impedance Value: 1002 Ohm
Lead Channel Impedance Value: 528 Ohm
Lead Channel Pacing Threshold Amplitude: 0.625 V
Lead Channel Pacing Threshold Amplitude: 1.125 V
Lead Channel Pacing Threshold Pulse Width: 0.4 ms
Lead Channel Pacing Threshold Pulse Width: 0.4 ms
Lead Channel Setting Pacing Amplitude: 2 V
Lead Channel Setting Pacing Amplitude: 2.5 V
Lead Channel Setting Pacing Pulse Width: 0.4 ms
Lead Channel Setting Sensing Sensitivity: 4 mV

## 2021-08-03 ENCOUNTER — Encounter: Payer: Self-pay | Admitting: Physician Assistant

## 2021-08-03 ENCOUNTER — Ambulatory Visit: Payer: Medicare Other | Admitting: Physician Assistant

## 2021-08-03 ENCOUNTER — Other Ambulatory Visit: Payer: Self-pay

## 2021-08-03 VITALS — BP 112/74 | HR 65 | Ht 67.0 in | Wt 237.4 lb

## 2021-08-03 DIAGNOSIS — Z952 Presence of prosthetic heart valve: Secondary | ICD-10-CM | POA: Diagnosis not present

## 2021-08-03 DIAGNOSIS — Z950 Presence of cardiac pacemaker: Secondary | ICD-10-CM

## 2021-08-03 DIAGNOSIS — I10 Essential (primary) hypertension: Secondary | ICD-10-CM

## 2021-08-03 DIAGNOSIS — I4729 Other ventricular tachycardia: Secondary | ICD-10-CM

## 2021-08-03 DIAGNOSIS — I5042 Chronic combined systolic (congestive) and diastolic (congestive) heart failure: Secondary | ICD-10-CM

## 2021-08-03 DIAGNOSIS — I5022 Chronic systolic (congestive) heart failure: Secondary | ICD-10-CM | POA: Diagnosis not present

## 2021-08-03 DIAGNOSIS — I2510 Atherosclerotic heart disease of native coronary artery without angina pectoris: Secondary | ICD-10-CM | POA: Diagnosis not present

## 2021-08-03 DIAGNOSIS — I480 Paroxysmal atrial fibrillation: Secondary | ICD-10-CM

## 2021-08-03 LAB — CUP PACEART INCLINIC DEVICE CHECK
Battery Impedance: 1958 Ohm
Battery Remaining Longevity: 38 mo
Battery Voltage: 2.75 V
Brady Statistic AP VP Percent: 0 %
Brady Statistic AP VS Percent: 73 %
Brady Statistic AS VP Percent: 0 %
Brady Statistic AS VS Percent: 26 %
Date Time Interrogation Session: 20230308175510
Implantable Lead Implant Date: 20020926
Implantable Lead Implant Date: 20020926
Implantable Lead Location: 753859
Implantable Lead Location: 753860
Implantable Lead Model: 5076
Implantable Lead Model: 5092
Implantable Pulse Generator Implant Date: 20120905
Lead Channel Impedance Value: 535 Ohm
Lead Channel Impedance Value: 946 Ohm
Lead Channel Pacing Threshold Amplitude: 0.625 V
Lead Channel Pacing Threshold Amplitude: 1 V
Lead Channel Pacing Threshold Amplitude: 1.125 V
Lead Channel Pacing Threshold Amplitude: 1.5 V
Lead Channel Pacing Threshold Pulse Width: 0.4 ms
Lead Channel Pacing Threshold Pulse Width: 0.4 ms
Lead Channel Pacing Threshold Pulse Width: 0.4 ms
Lead Channel Pacing Threshold Pulse Width: 0.4 ms
Lead Channel Sensing Intrinsic Amplitude: 11.2 mV
Lead Channel Sensing Intrinsic Amplitude: 2 mV
Lead Channel Setting Pacing Amplitude: 2 V
Lead Channel Setting Pacing Amplitude: 2.5 V
Lead Channel Setting Pacing Pulse Width: 0.4 ms
Lead Channel Setting Sensing Sensitivity: 4 mV

## 2021-08-03 NOTE — Patient Instructions (Signed)
Medication Instructions:   Your physician recommends that you continue on your current medications as directed. Please refer to the Current Medication list given to you today.  *If you need a refill on your cardiac medications before your next appointment, please call your pharmacy*   Lab Work: NONE ORDERED  TODAY   If you have labs (blood work) drawn today and your tests are completely normal, you will receive your results only by: MyChart Message (if you have MyChart) OR A paper copy in the mail If you have any lab test that is abnormal or we need to change your treatment, we will call you to review the results.   Testing/Procedures: NONE ORDERED  TODAY     Follow-Up: At [facility], you and your health needs are our priority.  As part of our continuing mission to provide you with exceptional heart care, we have created designated Provider Care Teams.  These Care Teams include your primary Cardiologist (physician) and Advanced Practice Providers (APPs -  Physician Assistants and Nurse Practitioners) who all work together to provide you with the care you need, when you need it.  We recommend signing up for the patient portal called "MyChart".  Sign up information is provided on this After Visit Summary.  MyChart is used to connect with patients for Virtual Visits (Telemedicine).  Patients are able to view lab/test results, encounter notes, upcoming appointments, etc.  Non-urgent messages can be sent to your provider as well.   To learn more about what you can do with MyChart, go to [email or web address].    Your next appointment:   1 year(s)  The format for your next appointment:   In Person  Provider:   You may see Dr. Klein or one of the following Advanced Practice Providers on your designated Care Team:   Renee Ursuy, PA-C Michael "Andy" Tillery, PA-C    Other Instructions  

## 2021-08-15 NOTE — Progress Notes (Signed)
Remote pacemaker transmission.   

## 2021-08-19 ENCOUNTER — Other Ambulatory Visit: Payer: Self-pay

## 2021-08-19 ENCOUNTER — Ambulatory Visit (INDEPENDENT_AMBULATORY_CARE_PROVIDER_SITE_OTHER): Payer: Medicare Other

## 2021-08-19 ENCOUNTER — Ambulatory Visit: Payer: Medicare Other | Admitting: Cardiology

## 2021-08-19 ENCOUNTER — Encounter: Payer: Self-pay | Admitting: Cardiology

## 2021-08-19 VITALS — BP 116/70 | HR 65 | Ht 67.0 in | Wt 238.2 lb

## 2021-08-19 DIAGNOSIS — E785 Hyperlipidemia, unspecified: Secondary | ICD-10-CM

## 2021-08-19 DIAGNOSIS — I2510 Atherosclerotic heart disease of native coronary artery without angina pectoris: Secondary | ICD-10-CM | POA: Diagnosis not present

## 2021-08-19 DIAGNOSIS — I350 Nonrheumatic aortic (valve) stenosis: Secondary | ICD-10-CM

## 2021-08-19 DIAGNOSIS — Z5181 Encounter for therapeutic drug level monitoring: Secondary | ICD-10-CM

## 2021-08-19 DIAGNOSIS — I5042 Chronic combined systolic (congestive) and diastolic (congestive) heart failure: Secondary | ICD-10-CM

## 2021-08-19 DIAGNOSIS — I480 Paroxysmal atrial fibrillation: Secondary | ICD-10-CM

## 2021-08-19 DIAGNOSIS — I10 Essential (primary) hypertension: Secondary | ICD-10-CM | POA: Diagnosis not present

## 2021-08-19 LAB — POCT INR: INR: 2.2 (ref 2.0–3.0)

## 2021-08-19 NOTE — Patient Instructions (Signed)
Description   Continue taking Warfarin 1 tablet everyday except 1.5 tablets on Mondays, Wednesdays, and Fridays. Recheck INR in 6 weeks. Coumadin clinic #[phone].     

## 2021-08-19 NOTE — Progress Notes (Signed)
?Cardiology Office Note:   ? ?Date:  08/19/2021  ? ?ID:  Brandon Klein, DOB March 11, 1941, MRN [id number] ? ?PCP:  Kathyrn Lass, MD  ?Cardiologist:  Fransico Him, MD   ? ?Referring MD: Kathyrn Lass, MD  ? ?Chief Complaint  ?Patient presents with  ? Coronary Artery Disease  ? Hypertension  ? Hyperlipidemia  ? Aortic Stenosis  ? Congestive Heart Failure  ? ? ?History of Present Illness:   ? ?Brandon Klein is a 81 y.o. male with a hx of carotid artery disease, CAD s/p 4V CABG (2015 PVT) s/p PCI to dRCA ([id number]), chronic systolic CHF, NICM, HTN, HLD, borderline DM on no therapy, PAF on coumadin, CKD stage II, sleep apnea, bradycardia s/p PPM and severe AS s/p TAVR (04/08/19). ?  ?He has a hx of CAD and underwent 4V CABG in 2015. He has had a pacemaker implanted remotely for bradycardia and syncope. He has paroxysmal atrial fibrillation and is on coumadin. His aortic stenosis has been followed for several years. Echo 01/31/19 showed LVEF=45-50% with severe AS. TAVR workup delayed while he completed his planned cataract surgery. Cardiac cath 03/21/19 with 3/4 patent bypass grafts. The SVG to the PDA is known to be occluded. A drug eluting stent was placed in the distal RCA.  ?  ?He underwent successful TAVR with a 29 mm Edwards Sapien 3 THV via the TF approach on 04/08/19. Post operative echo showed EF 50-55% with normally functioning TAVR with a mean gradient of 12 mm Hg and no PVL. He has done well in follow up. ? ?He is here today for followup and is doing well.  He denies any chest pain or pressure, SOB, DOE (except for some recent allergies), PND, orthopnea, LE edema, dizziness, palpitations or syncope. he is compliant with his meds and is tolerating meds with no SE.   He goes to the [facility] with 40 min of cardio and has no problems with his heart. ? ?Past Medical History:  ?Diagnosis Date  ? Chronic renal insufficiency   ? Coronary artery disease   ? a. s/p PCI of LAD 1997. b. Cutting balloon angioplasty to LCx in 2002. c. s/p  cath 11/2013 with severe 3 vessel ASCAD s/p CABG with LIMA to LAD, SVG to diag, SVG to left circ and SVG to PDA. d. cath 07/07/2014 occluded SVG to PDA, all other grafts patent. EF down for likely NICM  ? DJD (degenerative joint disease)   ? Epistaxis   ? GERD (gastroesophageal reflux disease)   ? GI bleeding   ? a.  with benign gastric polypectomy 05/2014.  ? HLD (hyperlipidemia)   ? HTN (hypertension)   ? Obesity   ? OSA (obstructive sleep apnea)   ? Pacemaker   ? a. 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 b. MDT generator replacement 2012.  ? PAF (paroxysmal atrial fibrillation) ([place])   ? on chronic systemic anticoagulation  ? PVC's (premature ventricular contractions)   ? S/P CABG x 4 12/01/2013  ? LIMA to LAD, SVG to Diag, SVG to OM, SVG to PDA  ? 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 s/p TAVR with a 37mm Edwards Sapien 3 THV via the TF approach  ? Severe aortic stenosis   ? Syncope   ? a. vasovagal with documented bradycardia.  ? Type 2 diabetes mellitus without complications ([place]) 0000000  ? ? ?Past Surgical History:  ?Procedure Laterality Date  ? ANGIOPLASTY  1997  ? CHOLECYSTECTOMY    ?  COLONOSCOPY WITH PROPOFOL N/A 05/05/2014  ? Procedure: COLONOSCOPY WITH PROPOFOL;  Surgeon: Garlan Fair, MD;  Location: [facility];  Service: Endoscopy;  Laterality: N/A;  ? CORONARY ARTERY BYPASS GRAFT N/A 12/01/2013  ? Procedure: CORONARY ARTERY BYPASS GRAFT TIMES FOUR USING LEFT INTERNAL MAMMARY ARTERY TO LAD, SAPHENOUS VEIN GRAFTS TO DIAGONAL, CIRCUMFELX AND POSTERIOR DESCENDING;  Surgeon: Ivin Poot, MD;  Location: [place];  Service: Open Heart Surgery;  Laterality: N/A;  ? CORONARY STENT INTERVENTION N/A 03/21/2019  ? Procedure: CORONARY STENT INTERVENTION;  Surgeon: Burnell Blanks, MD;  Location: [place] CV LAB;  Service: Cardiovascular;  Laterality: N/A;  Distal RCA  ? ESOPHAGOGASTRODUODENOSCOPY (EGD) WITH PROPOFOL N/A 05/05/2014  ? Procedure:  ESOPHAGOGASTRODUODENOSCOPY (EGD) WITH PROPOFOL;  Surgeon: Garlan Fair, MD;  Location: [facility];  Service: Endoscopy;  Laterality: N/A;  ? KNEE ARTHROSCOPY  2000  ? Left  ? LEFT AND RIGHT HEART CATHETERIZATION WITH CORONARY/GRAFT ANGIOGRAM N/A 07/07/2014  ? Procedure: LEFT AND RIGHT HEART CATHETERIZATION WITH Beatrix Fetters;  Surgeon: Burnell Blanks, MD;  Location: [facility] CATH LAB;  Service: Cardiovascular;  Laterality: N/A;  ? LEFT HEART CATHETERIZATION WITH CORONARY ANGIOGRAM N/A 11/25/2013  ? Procedure: LEFT HEART CATHETERIZATION WITH CORONARY ANGIOGRAM;  Surgeon: Sueanne Margarita, MD;  Location: [place] CATH LAB;  Service: Cardiovascular;  Laterality: N/A;  ? PACEMAKER INSERTION  2002  ? generator change MDT by Dr Caryl Comes in 2012  ? RIGHT/LEFT HEART CATH AND CORONARY/GRAFT ANGIOGRAPHY N/A 03/21/2019  ? Procedure: RIGHT/LEFT HEART CATH AND CORONARY/GRAFT ANGIOGRAPHY;  Surgeon: Burnell Blanks, MD;  Location: [place] CV LAB;  Service: Cardiovascular;  Laterality: N/A;  ? TEE WITHOUT CARDIOVERSION N/A 08/31/2014  ? Procedure: TRANSESOPHAGEAL ECHOCARDIOGRAM (TEE);  Surgeon: Sueanne Margarita, MD;  Location: [place];  Service: Cardiovascular;  Laterality: N/A;  ? TEE WITHOUT CARDIOVERSION N/A 04/08/2019  ? Procedure: TRANSESOPHAGEAL ECHOCARDIOGRAM (TEE);  Surgeon: Burnell Blanks, MD;  Location: [place];  Service: Open Heart Surgery;  Laterality: N/A;  ? TRANSCATHETER AORTIC VALVE REPLACEMENT, TRANSFEMORAL  04/08/2019  ? TRANSCATHETER AORTIC VALVE REPLACEMENT, TRANSFEMORAL N/A 04/08/2019  ? Procedure: TRANSCATHETER AORTIC VALVE REPLACEMENT, TRANSFEMORAL;  Surgeon: Burnell Blanks, MD;  Location: [place];  Service: Open Heart Surgery;  Laterality: N/A;  ? ? ?Current Medications: ?Current Meds  ?Medication Sig  ? Acetaminophen (TYLENOL ARTHRITIS PAIN PO) Take 1,300 mg by mouth 3 (three) times daily as needed.  ? aspirin 81 MG EC tablet Take 81 mg by mouth daily. Swallow whole.  ?  atorvastatin (LIPITOR) 80 MG tablet TAKE 1 TABLET BY MOUTH  DAILY  ? azithromycin (ZITHROMAX) 500 MG tablet TAKE 1 TABLET BY MOUTH 1 HOUR BEFORE ANY DENTAL WORK INCLUDING CLEANINGS  ? carvedilol (COREG) 25 MG tablet TAKE 1 TABLET(25 MG) BY MOUTH TWICE DAILY WITH A MEAL  ? colchicine 0.6 MG tablet as directed. Take 1 tablet by mouth twice daily and 2 tablets at onset of Gout  ? docusate sodium (COLACE) 100 MG capsule Take 200 mg by mouth daily as needed for mild constipation.   ? fexofenadine (ALLEGRA) 180 MG tablet Take 180 mg by mouth daily.   ? fluticasone (FLONASE) 50 MCG/ACT nasal spray Place 2 sprays into the nose daily.   ? furosemide (LASIX) 20 MG tablet TAKE 1 TABLET BY MOUTH TWICE DAILY ALONG WITH 80 MG TABLET  ? furosemide (LASIX) 80 MG tablet TAKE 1 TABLET BY MOUTH TWICE DAILY WITH 20 MG TABLET  ? Glucosamine-Chondroitin 500-400 MG CAPS Take 1 tablet  by mouth daily.  ? hydrALAZINE (APRESOLINE) 25 MG tablet TAKE 1 TABLET(25 MG) BY MOUTH THREE TIMES DAILY  ? isosorbide mononitrate (IMDUR) 30 MG 24 hr tablet TAKE 1 TABLET(30 MG) BY MOUTH DAILY  ? L-Lysine 1000 MG TABS Take 1,000 mg by mouth daily.   ? meclizine (ANTIVERT) 25 MG tablet Take 25 mg by mouth daily as needed for dizziness.  ? Multiple Vitamin (MULTIVITAMIN) tablet Take 1 tablet by mouth daily. Centrum silver  ? nitroGLYCERIN (NITROSTAT) 0.4 MG SL tablet Place 1 tablet (0.4 mg total) under the tongue every 5 (five) minutes as needed for chest pain.  ? ONE TOUCH ULTRA TEST test strip 1 each by Other route every morning.   ? ONETOUCH DELICA LANCETS FINE MISC 1 each by Other route every morning.   ? pantoprazole (PROTONIX) 40 MG tablet Take 40 mg by mouth daily.   ? potassium chloride SA (KLOR-CON M) 20 MEQ tablet TAKE 2 TABLETS(40 MEQ) BY MOUTH TWICE DAILY  ? RA NATURAL MAGNESIUM 250 MG TABS TAKE 2 TABLETS TWICE DAILY.  ? spironolactone (ALDACTONE) 25 MG tablet TAKE 1 TABLET(25 MG) BY MOUTH DAILY  ? warfarin (COUMADIN) 5 MG tablet TAKE 1 TO 1 AND  1/2 TABLETS BY MOUTH DAILY AS DIRECTED BY COUMADIN CLINIC  ?  ? ?Allergies:   Vascepa [icosapent ethyl], Acetaminophen-codeine, Amoxicillin, Codeine, and Diovan [valsartan]  ? ?Social History  ? ?Socioeconomic History

## 2021-08-19 NOTE — Patient Instructions (Signed)
Medication Instructions:  ?Your physician recommends that you continue on your current medications as directed. Please refer to the Current Medication list given to you today. ? ?*If you need a refill on your cardiac medications before your next appointment, please call your pharmacy* ? ? ?Lab Work: ?Come back for fasting lipid and ALT ?If you have labs (blood work) drawn today and your tests are completely normal, you will receive your results only by: ?MyChart Message (if you have MyChart) OR ?A paper copy in the mail ?If you have any lab test that is abnormal or we need to change your treatment, we will call you to review the results. ? ? ?Testing/Procedures: ?Your physician has requested that you have an echocardiogram. Echocardiography is a painless test that uses sound waves to create images of your heart. It provides your doctor with information about the size and shape of your heart and how well your heart?s chambers and valves are working. This procedure takes approximately one hour. There are no restrictions for this procedure. ? ?Follow-Up: ?At [facility], you and your health needs are our priority.  As part of our continuing mission to provide you with exceptional heart care, we have created designated Provider Care Teams.  These Care Teams include your primary Cardiologist (physician) and Advanced Practice Providers (APPs -  Physician Assistants and Nurse Practitioners) who all work together to provide you with the care you need, when you need it. ? ?Your next appointment:   ?1 year(s) ? ?The format for your next appointment:   ?In Person ? ?Provider:   ?Armanda Magic, MD   ? ? ?

## 2021-08-19 NOTE — Addendum Note (Signed)
Addended by: Theresia Majors on: 08/19/2021 02:21 PM ? ? Modules accepted: Orders ? ?

## 2021-08-25 IMAGING — CR DG CHEST 2V
2 series · 2 of 2 positions shown · non-contrast
Comparison: 05/09/2018

CLINICAL DATA: Preop TAVR.

EXAM:
CHEST - 2 VIEW

[w chest pa]
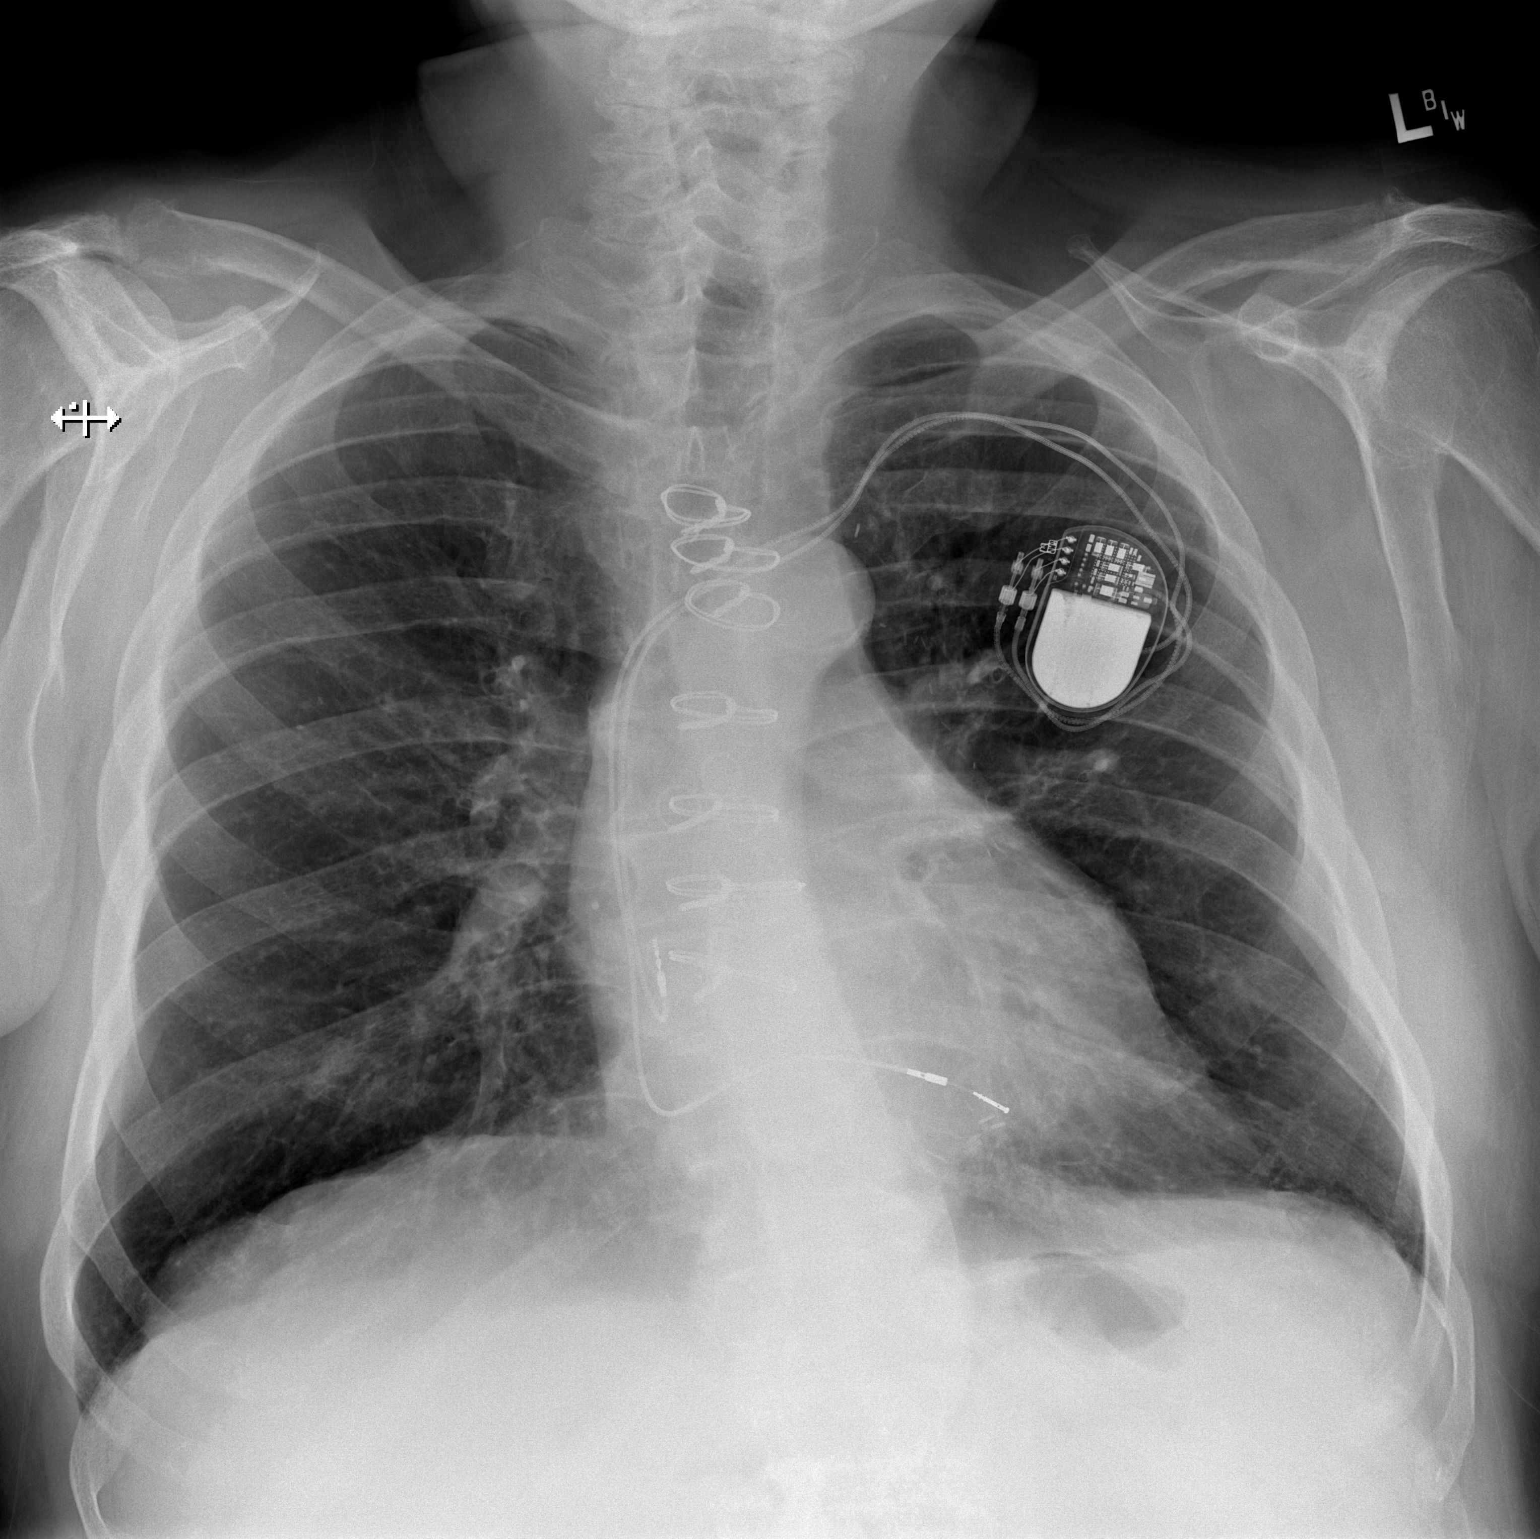

[w chest lat]
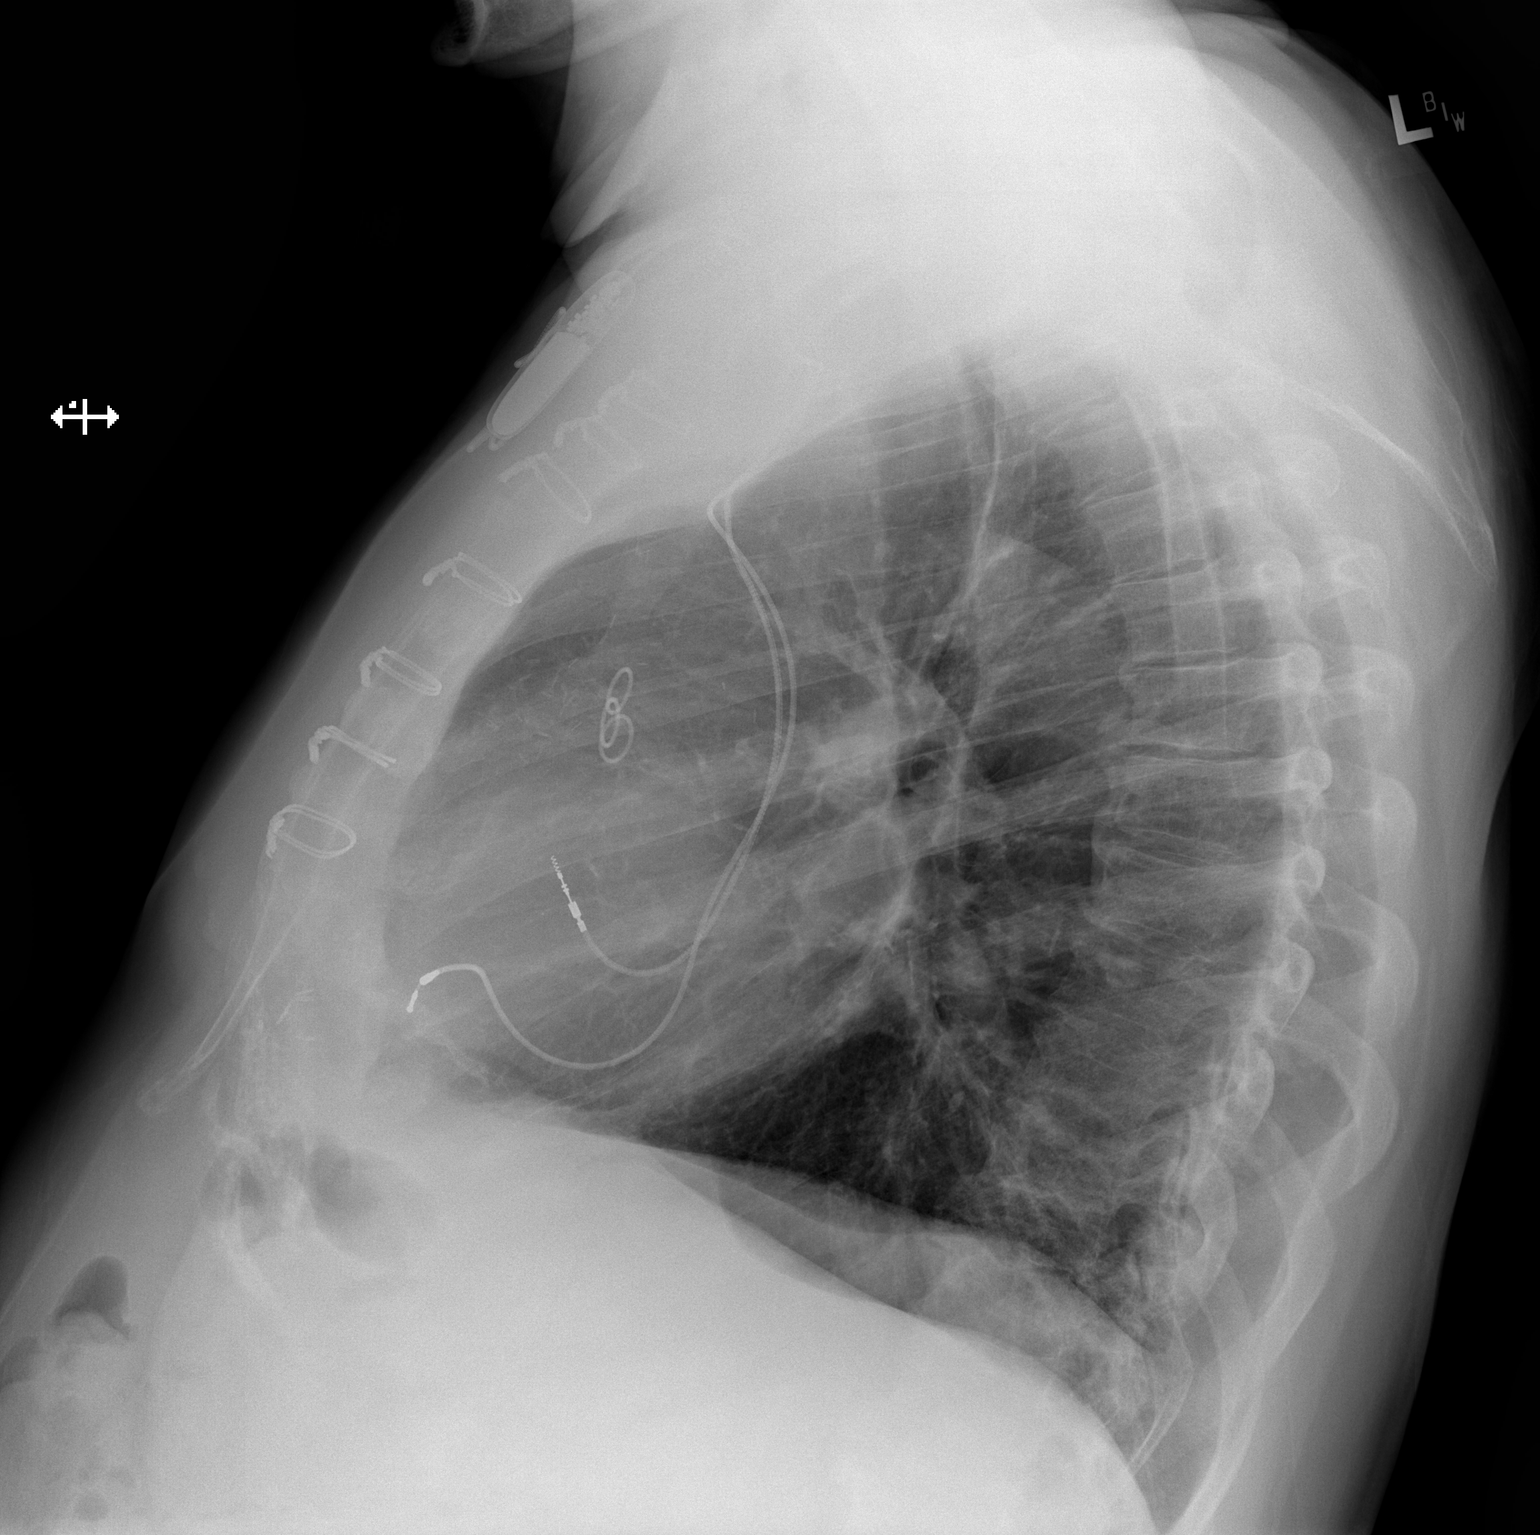

[2 of 2 positions shown; findings below may reference images not displayed]

FINDINGS: Sternotomy wires and left-sided pacemaker unchanged. Lungs are
adequately inflated without consolidation or effusion.
Cardiomediastinal silhouette and remainder of the exam is unchanged.
IMPRESSION: No active cardiopulmonary disease.

## 2021-09-06 ENCOUNTER — Other Ambulatory Visit: Payer: Medicare Other

## 2021-09-06 ENCOUNTER — Ambulatory Visit (HOSPITAL_COMMUNITY): Payer: Medicare Other | Attending: Cardiology

## 2021-09-06 DIAGNOSIS — E785 Hyperlipidemia, unspecified: Secondary | ICD-10-CM | POA: Diagnosis not present

## 2021-09-06 DIAGNOSIS — I10 Essential (primary) hypertension: Secondary | ICD-10-CM | POA: Diagnosis not present

## 2021-09-06 DIAGNOSIS — I5042 Chronic combined systolic (congestive) and diastolic (congestive) heart failure: Secondary | ICD-10-CM | POA: Insufficient documentation

## 2021-09-06 DIAGNOSIS — I350 Nonrheumatic aortic (valve) stenosis: Secondary | ICD-10-CM

## 2021-09-06 DIAGNOSIS — I480 Paroxysmal atrial fibrillation: Secondary | ICD-10-CM

## 2021-09-06 DIAGNOSIS — I2510 Atherosclerotic heart disease of native coronary artery without angina pectoris: Secondary | ICD-10-CM | POA: Diagnosis not present

## 2021-09-06 LAB — ECHOCARDIOGRAM COMPLETE
AR max vel: 1.55 cm2
AV Mean grad: 7.2 mmHg
AV Peak grad: 14.1 mmHg
Ao pk vel: 1.88 m/s
Area-P 1/2: 1.97 cm2
S' Lateral: 4.2 cm

## 2021-09-07 ENCOUNTER — Telehealth: Payer: Self-pay | Admitting: Cardiology

## 2021-09-07 DIAGNOSIS — E785 Hyperlipidemia, unspecified: Secondary | ICD-10-CM

## 2021-09-07 LAB — LIPID PANEL
Chol/HDL Ratio: 3.1 ratio (ref 0.0–5.0)
Cholesterol, Total: 129 mg/dL (ref 100–199)
HDL: 41 mg/dL (ref >39)
LDL Chol Calc (NIH): 56 mg/dL (ref 0–99)
Triglycerides: 196 mg/dL — ABNORMAL HIGH (ref 0–149)
VLDL Cholesterol Cal: 32 mg/dL (ref 5–40)

## 2021-09-07 LAB — ALT: ALT: 22 IU/L (ref 0–44)

## 2021-09-07 NOTE — Telephone Encounter (Signed)
Left message for patient advising him that Dr. [name] has referred him to Dr. Rennis Golden. Advised to call back with any questions.  ?

## 2021-09-07 NOTE — Telephone Encounter (Signed)
-----   Message from [name], MD sent at 09/07/2021  8:57 AM EDT ----- ?LDL is at goal but triglycerides are too high.  Please start Vascepa 2 g twice daily and repeat FLP and ALT in 3 months ?

## 2021-09-07 NOTE — Telephone Encounter (Signed)
?  Pt is returning call. He said, he is leaving in an hour and requested if Brandon Klein can call him back before that ?

## 2021-09-07 NOTE — Telephone Encounter (Signed)
The patient has been notified of the result and verbalized understanding.  All questions (if any) were answered. ?Theresia Majors, RN 09/07/2021 12:27 PM  ? ?Patient cannot tolerate Vascepa, it is listed as an allergy - caused myalgias.  ?

## 2021-09-27 ENCOUNTER — Ambulatory Visit: Payer: Medicare Other | Admitting: *Deleted

## 2021-09-27 DIAGNOSIS — Z5181 Encounter for therapeutic drug level monitoring: Secondary | ICD-10-CM

## 2021-09-27 DIAGNOSIS — I480 Paroxysmal atrial fibrillation: Secondary | ICD-10-CM

## 2021-09-27 LAB — POCT INR: INR: 2.4 (ref 2.0–3.0)

## 2021-09-27 NOTE — Patient Instructions (Signed)
Description   Continue taking Warfarin 1 tablet everyday except 1.5 tablets on Mondays, Wednesdays, and Fridays. Recheck INR in 6 weeks. Coumadin clinic #[phone].     

## 2021-11-01 ENCOUNTER — Ambulatory Visit (INDEPENDENT_AMBULATORY_CARE_PROVIDER_SITE_OTHER): Payer: Medicare Other

## 2021-11-01 DIAGNOSIS — I480 Paroxysmal atrial fibrillation: Secondary | ICD-10-CM | POA: Diagnosis not present

## 2021-11-01 LAB — CUP PACEART REMOTE DEVICE CHECK
Battery Impedance: 2112 Ohm
Battery Remaining Longevity: 35 mo
Battery Voltage: 2.75 V
Brady Statistic AP VP Percent: 1 %
Brady Statistic AP VS Percent: 84 %
Brady Statistic AS VP Percent: 0 %
Brady Statistic AS VS Percent: 16 %
Date Time Interrogation Session: 20230606124136
Implantable Lead Implant Date: 20020926
Implantable Lead Implant Date: 20020926
Implantable Lead Location: 753859
Implantable Lead Location: 753860
Implantable Lead Model: 5076
Implantable Lead Model: 5092
Implantable Pulse Generator Implant Date: 20120905
Lead Channel Impedance Value: 1071 Ohm
Lead Channel Impedance Value: 523 Ohm
Lead Channel Pacing Threshold Amplitude: 0.625 V
Lead Channel Pacing Threshold Amplitude: 1 V
Lead Channel Pacing Threshold Pulse Width: 0.4 ms
Lead Channel Pacing Threshold Pulse Width: 0.4 ms
Lead Channel Setting Pacing Amplitude: 2 V
Lead Channel Setting Pacing Amplitude: 2.5 V
Lead Channel Setting Pacing Pulse Width: 0.4 ms
Lead Channel Setting Sensing Sensitivity: 4 mV

## 2021-11-03 ENCOUNTER — Other Ambulatory Visit: Payer: Self-pay | Admitting: Cardiology

## 2021-11-08 ENCOUNTER — Ambulatory Visit: Payer: Medicare Other | Admitting: *Deleted

## 2021-11-08 DIAGNOSIS — I480 Paroxysmal atrial fibrillation: Secondary | ICD-10-CM | POA: Diagnosis not present

## 2021-11-08 DIAGNOSIS — Z5181 Encounter for therapeutic drug level monitoring: Secondary | ICD-10-CM

## 2021-11-08 LAB — POCT INR: INR: 2.4 (ref 2.0–3.0)

## 2021-11-08 NOTE — Patient Instructions (Signed)
Description   Continue taking Warfarin 1 tablet everyday except 1.5 tablets on Mondays, Wednesdays, and Fridays. Recheck INR in 6 weeks. Coumadin clinic [phone].

## 2021-11-15 NOTE — Progress Notes (Signed)
Remote pacemaker transmission.   

## 2021-12-20 ENCOUNTER — Ambulatory Visit: Payer: Medicare Other

## 2021-12-20 DIAGNOSIS — Z5181 Encounter for therapeutic drug level monitoring: Secondary | ICD-10-CM

## 2021-12-20 DIAGNOSIS — I480 Paroxysmal atrial fibrillation: Secondary | ICD-10-CM

## 2021-12-20 LAB — POCT INR: INR: 2.1 (ref 2.0–3.0)

## 2021-12-20 NOTE — Patient Instructions (Signed)
Continue taking Warfarin 1 tablet everyday except 1.5 tablets on Mondays, Wednesdays, and Fridays. Recheck INR in 6 weeks. Coumadin clinic #[phone]. 

## 2021-12-22 DIAGNOSIS — L089 Local infection of the skin and subcutaneous tissue, unspecified: Secondary | ICD-10-CM | POA: Diagnosis not present

## 2022-01-24 ENCOUNTER — Other Ambulatory Visit: Payer: Self-pay | Admitting: Cardiology

## 2022-01-24 ENCOUNTER — Telehealth: Payer: Self-pay

## 2022-01-24 DIAGNOSIS — I480 Paroxysmal atrial fibrillation: Secondary | ICD-10-CM

## 2022-01-24 NOTE — Telephone Encounter (Signed)
Received a call from the patient stating he got a call from [facility] pharmacy stating we denied his rx for coumadin. He then stated " y'all must want me to die". I told the patient that I would send a message to the coumadin nurses and have them give him a call. He voiced understanding.

## 2022-01-24 NOTE — Telephone Encounter (Signed)
I spoke to the patient and informed him that we refilled his Warfarin prescription.  I do not know anything about the denial.

## 2022-01-31 ENCOUNTER — Ambulatory Visit (INDEPENDENT_AMBULATORY_CARE_PROVIDER_SITE_OTHER): Payer: Medicare Other

## 2022-01-31 ENCOUNTER — Ambulatory Visit: Payer: Medicare Other | Attending: Cardiology

## 2022-01-31 DIAGNOSIS — I480 Paroxysmal atrial fibrillation: Secondary | ICD-10-CM | POA: Diagnosis not present

## 2022-01-31 DIAGNOSIS — Z5181 Encounter for therapeutic drug level monitoring: Secondary | ICD-10-CM

## 2022-01-31 LAB — POCT INR: INR: 2.3 (ref 2.0–3.0)

## 2022-01-31 NOTE — Patient Instructions (Signed)
Continue taking Warfarin 1 tablet everyday except 1.5 tablets on Mondays, Wednesdays, and Fridays. Recheck INR in 6 weeks. Coumadin clinic #[phone]. 

## 2022-02-03 LAB — CUP PACEART REMOTE DEVICE CHECK
Battery Impedance: 2112 Ohm
Battery Remaining Longevity: 35 mo
Battery Voltage: 2.74 V
Brady Statistic AP VP Percent: 1 %
Brady Statistic AP VS Percent: 81 %
Brady Statistic AS VP Percent: 0 %
Brady Statistic AS VS Percent: 18 %
Date Time Interrogation Session: 20230905131819
Implantable Lead Implant Date: 20020926
Implantable Lead Implant Date: 20020926
Implantable Lead Location: 753859
Implantable Lead Location: 753860
Implantable Lead Model: 5076
Implantable Lead Model: 5092
Implantable Pulse Generator Implant Date: 20120905
Lead Channel Impedance Value: 505 Ohm
Lead Channel Impedance Value: 996 Ohm
Lead Channel Pacing Threshold Amplitude: 0.625 V
Lead Channel Pacing Threshold Amplitude: 1.125 V
Lead Channel Pacing Threshold Pulse Width: 0.4 ms
Lead Channel Pacing Threshold Pulse Width: 0.4 ms
Lead Channel Setting Pacing Amplitude: 2 V
Lead Channel Setting Pacing Amplitude: 2.5 V
Lead Channel Setting Pacing Pulse Width: 0.4 ms
Lead Channel Setting Sensing Sensitivity: 4 mV

## 2022-02-07 DIAGNOSIS — L089 Local infection of the skin and subcutaneous tissue, unspecified: Secondary | ICD-10-CM | POA: Diagnosis not present

## 2022-02-14 DIAGNOSIS — K219 Gastro-esophageal reflux disease without esophagitis: Secondary | ICD-10-CM | POA: Diagnosis not present

## 2022-02-14 DIAGNOSIS — E782 Mixed hyperlipidemia: Secondary | ICD-10-CM | POA: Diagnosis not present

## 2022-02-14 DIAGNOSIS — M1990 Unspecified osteoarthritis, unspecified site: Secondary | ICD-10-CM | POA: Diagnosis not present

## 2022-02-14 DIAGNOSIS — E1122 Type 2 diabetes mellitus with diabetic chronic kidney disease: Secondary | ICD-10-CM | POA: Diagnosis not present

## 2022-02-14 DIAGNOSIS — S61209D Unspecified open wound of unspecified finger without damage to nail, subsequent encounter: Secondary | ICD-10-CM | POA: Diagnosis not present

## 2022-02-14 DIAGNOSIS — G8929 Other chronic pain: Secondary | ICD-10-CM | POA: Diagnosis not present

## 2022-02-14 DIAGNOSIS — I10 Essential (primary) hypertension: Secondary | ICD-10-CM | POA: Diagnosis not present

## 2022-02-22 NOTE — Progress Notes (Signed)
Remote pacemaker transmission.   

## 2022-03-02 ENCOUNTER — Encounter (HOSPITAL_BASED_OUTPATIENT_CLINIC_OR_DEPARTMENT_OTHER): Payer: Self-pay | Admitting: Internal Medicine

## 2022-03-02 ENCOUNTER — Ambulatory Visit (HOSPITAL_BASED_OUTPATIENT_CLINIC_OR_DEPARTMENT_OTHER): Payer: Medicare Other | Admitting: Internal Medicine

## 2022-03-02 VITALS — BP 123/78 | HR 75 | Ht 67.0 in | Wt 236.4 lb

## 2022-03-02 DIAGNOSIS — I2510 Atherosclerotic heart disease of native coronary artery without angina pectoris: Secondary | ICD-10-CM

## 2022-03-02 DIAGNOSIS — E785 Hyperlipidemia, unspecified: Secondary | ICD-10-CM | POA: Diagnosis not present

## 2022-03-02 DIAGNOSIS — E781 Pure hyperglyceridemia: Secondary | ICD-10-CM | POA: Diagnosis not present

## 2022-03-02 DIAGNOSIS — I10 Essential (primary) hypertension: Secondary | ICD-10-CM

## 2022-03-02 MED ORDER — EZETIMIBE 10 MG PO TABS: 10.0000 mg | ORAL_TABLET | Freq: Every day | ORAL | 3 refills | Status: DC

## 2022-03-02 MED ORDER — ATORVASTATIN CALCIUM 40 MG PO TABS: 40.0000 mg | ORAL_TABLET | Freq: Every day | ORAL | 3 refills | Status: DC

## 2022-03-02 NOTE — Patient Instructions (Signed)
Medication Instructions:  Your physician has recommended you make the following change in your medication:   Change: Atorvastatin 40mg  daily   Start: Zetia 10mg  daily   *If you need a refill on your cardiac medications before your next appointment, please call your pharmacy*   Lab Work: Please return for Lab work in about 3 months- Fasting Lipid Panel, nmr, APOB and LPa. You may come to the...   [facility] (3rd floor) [address], [place], [place] [address]  Open: 8am-Noon and 1pm-4:30pm  Please ring the doorbell on the small table when you exit the elevator and the Lab Tech will come get you  [facility] [address] [place], [place], [place] [address] Open: 8am-1pm, then 2pm-4:30pm   [place]- Please see attached locations sheet stapled to your lab work with address and hours.   If you have labs (blood work) drawn today and your tests are completely normal, you will receive your results only by: [place] (if you have MyChart) OR A paper copy in the mail If you have any lab test that is abnormal or we need to change your treatment, we will call you to review the results.  Follow-Up: At [facility], you and your health needs are our priority.  As part of our continuing mission to provide you with exceptional heart care, we have created designated Provider Care Teams.  These Care Teams include your primary Cardiologist (physician) and Advanced Practice Providers (APPs -  Physician Assistants and Nurse Practitioners) who all work together to provide you with the care you need, when you need it.  We recommend signing up for the patient portal called "MyChart".  Sign up information is provided on this After Visit Summary.  MyChart is used to connect with patients for Virtual Visits (Telemedicine).  Patients are able to view lab/test results, encounter notes, upcoming appointments, etc.  Non-urgent messages can be  sent to your provider as well.   To learn more about what you can do with MyChart, go to [email or web address].    Your next appointment:   4 month(s)  The format for your next appointment:   In Person  Provider:   K. Mali Hilty, MD

## 2022-03-02 NOTE — Progress Notes (Signed)
LIPID CLINIC CONSULT NOTE  Chief Complaint:  High triglycerides  Primary Care Physician: Kathyrn Lass, MD  Primary Cardiologist:  Fransico Him, MD  HPI:  Brandon Klein is a 81 y.o. male who is being seen today for the evaluation of high triglycerides at the request of Turner, Eber Hong, MD. this is a pleasant 81 year old male kindly referred by Dr. Radford Pax for evaluation of high triglycerides.  He has a history of extensive coronary artery disease with prior CABG and PCI, chronic kidney disease, hypertension, dyslipidemia, prior pacemaker or any atrial fibrillation, and more recently TAVR.  He reports longstanding high triglycerides.  He notes that despite multiple medications his triglycerides have always remain elevated.  Currently he is on 80 mg of atorvastatin daily which recently had been increased from 40 mg.  He had previously seen a several of the Pharm.D.'s in our lipid clinic.  He was trialed on Vascepa which he tried 3 different times but had severe reactions including myalgias and other side effects and said that he would absolutely never take that medication again.  This is the only specific triglyceride lowering medication that has cardiovascular risk reduction data.  We did discuss his triglycerides and I agree that persistently elevated triglycerides are associated with increased cardiovascular risk.  We also discussed possible options today including a clinical trial to lower triglycerides as well as alternative therapies.  He has reported some leg pain issues which might be related to his atorvastatin especially at very high dose.  PMHx:  Past Medical History:  Diagnosis Date   Chronic renal insufficiency    Coronary artery disease    a. s/p PCI of LAD 1997. b. Cutting balloon angioplasty to LCx in 2002. c. s/p cath 11/2013 with severe 3 vessel ASCAD s/p CABG with LIMA to LAD, SVG to diag, SVG to left circ and SVG to PDA. d. cath 07/07/2014 occluded SVG to PDA, all other grafts  patent. 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  with benign gastric polypectomy 05/2014.   HLD (hyperlipidemia)    HTN (hypertension)    Obesity    OSA (obstructive sleep apnea)    Pacemaker    a. 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 b. MDT generator replacement 2012.   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 vasovagal with documented bradycardia.   Type 2 diabetes mellitus without complications ([place]) 07/05/7410    Past Surgical History:  Procedure Laterality Date   ANGIOPLASTY  1997   CHOLECYSTECTOMY     COLONOSCOPY WITH PROPOFOL N/A 05/05/2014   Procedure: COLONOSCOPY WITH PROPOFOL;  Surgeon: Garlan Fair, MD;  Location: [facility] ENDOS[facility];  Service: Endoscopy;  Laterality: N/A;   CORONARY ARTERY BYPASS GRAFT N/A 12/01/2013   Procedure: CORONARY ARTERY BYPASS GRAFT TIMES FOUR USING LEFT INTERNAL MAMMARY ARTERY TO LAD, SAPHENOUS VEIN GRAFTS TO DIAGONAL, CIRCUMFELX AND POSTERIOR DESCENDING;  Surgeon: Ivin Poot, MD;  Location: [facility];  Service: Open  Heart Surgery;  Laterality: N/A;   CORONARY STENT INTERVENTION N/A 03/21/2019   Procedure: CORONARY STENT INTERVENTION;  Surgeon: Kathleene Hazel, MD;  Location: [facility] INVASIVE CV LAB;  Service: Cardiovascular;  Laterality: N/A;  Distal RCA   ESOPHAGOGASTRODUODENOSCOPY (EGD) WITH PROPOFOL N/A 05/05/2014   Procedure: ESOPHAGOGASTRODUODENOSCOPY (EGD) WITH PROPOFOL;  Surgeon: Charolett Bumpers, MD;  Location: [facility];  Service: Endoscopy;  Laterality: N/A;   KNEE ARTHROSCOPY  2000   Left    LEFT AND RIGHT HEART CATHETERIZATION WITH CORONARY/GRAFT ANGIOGRAM N/A 07/07/2014   Procedure: LEFT AND RIGHT HEART CATHETERIZATION WITH Isabel Caprice;  Surgeon: Kathleene Hazel, MD;  Location[facility] CATH LAB;  Service: Cardiovascular;  Laterality: N/A;   LEFT HEART CATHETERIZATION WITH CORONARY ANGIOGRAM N/A 11/25/2013   Procedure: LEFT HEART CATHETERIZATION WITH CORONARY ANGIOGRAM;  Surgeon: [name], MD;  Location: [facility] CATH LAB;  Service: Cardiovascular;  Laterality: N/A;   PACEMAKER INSERTION  2002   generator change MDT by Dr Graciela Husbands in 2012   RIGHT/LEFT HEART CATH AND CORONARY/GRAFT ANGIOGRAPHY N/A 03/21/2019   Procedure: RIGHT/LEFT HEART CATH AND CORONARY/GRAFT ANGIOGRAPHY;  Surgeon: Kathleene Hazel, MD;  Location: [facility];  Service: Cardiovascular;  Laterality: N/A;   TEE WITHOUT CARDIOVERSION N/A 08/31/2014   Procedure: TRANSESOPHAGEAL ECHOCARDIOGRAM (TEE);  Surgeon: [name], MD;  Location: [facility] ENDOSCOPY;  Service: Cardiovascular;  Laterality: N/A;   TEE WITHOUT CARDIOVERSION N/A 04/08/2019   Procedure: TRANSESOPHAGEAL ECHOCARDIOGRAM (TEE);  Surgeon: Kathleene Hazel, MD;  Location[facility] OR;  Service: Open Heart Surgery;  Laterality: N/A;   TRANSCATHETER AORTIC VALVE REPLACEMENT, TRANSFEMORAL  04/08/2019   TRANSCATHETER AORTIC VALVE REPLACEMENT, TRANSFEMORAL N/A 04/08/2019   Procedure: TRANSCATHETER AORTIC VALVE REPLACEMENT, TRANSFEMORAL;  Surgeon: Kathleene Hazel, MD;  Location: [facility] OR;  Service: Open Heart Surgery;  Laterality: N/A;    FAMHx:  Family History  Problem Relation Age of Onset   Colon cancer Mother        deceased   Heart attack Mother    Leukemia Father        deceased   Heart attack Brother        deceased    SOCHx:   reports that he quit smoking about 55 years ago. His smoking use included cigarettes. He has a 10.00 pack-year smoking history. He has never used smokeless tobacco. He reports current alcohol  use. He reports that he does not use drugs.  ALLERGIES:  Allergies  Allergen Reactions   Vascepa [Icosapent Ethyl]     Myalgias, TRIED 3 TIMES   Acetaminophen-Codeine Other (See Comments)    Extreme constipation Extreme constipation    Amoxicillin Itching and Rash    Did it involve swelling of the face/tongue/throat, SOB, or low BP? No Did it involve sudden or severe rash/hives, skin peeling, or any reaction on the inside of your mouth or nose? Unknown  Did you need to seek medical attention at a hospital or doctor's office? No When did it last happen?More than 40 years ago        If all above answers are "NO", may proceed with cephalosporin use.    Codeine Nausea And Vomiting   Diovan [Valsartan] Other (See Comments)    Unknown     ROS: Pertinent items noted in HPI and remainder of comprehensive ROS otherwise negative.  HOME MEDS: Current Outpatient Medications on File Prior to Visit  Medication Sig Dispense Refill   Acetaminophen (TYLENOL ARTHRITIS PAIN PO) Take 1,300 mg by mouth 3 (three)  times daily as needed.     aspirin 81 MG EC tablet Take 81 mg by mouth daily. Swallow whole.     azithromycin (ZITHROMAX) 500 MG tablet TAKE 1 TABLET BY MOUTH 1 HOUR BEFORE ANY DENTAL WORK INCLUDING CLEANINGS 6 tablet 6   carvedilol (COREG) 25 MG tablet TAKE 1 TABLET(25 MG) BY MOUTH TWICE DAILY WITH A MEAL 180 tablet 3   colchicine 0.6 MG tablet as directed. Take 1 tablet by mouth twice daily and 2 tablets at onset of Gout     docusate sodium (COLACE) 100 MG capsule Take 200 mg by mouth daily as needed for mild constipation.      fexofenadine (ALLEGRA) 180 MG tablet Take 180 mg by mouth daily.      fluticasone (FLONASE) 50 MCG/ACT nasal spray Place 2 sprays into the nose daily.      furosemide (LASIX) 20 MG tablet TAKE 1 TABLET BY MOUTH TWICE DAILY ALONG WITH 80 MG TABLET 180 tablet 2   furosemide (LASIX) 80 MG tablet TAKE 1 TABLET BY MOUTH TWICE DAILY WITH 20 MG TABLET 180 tablet 2    Glucosamine-Chondroitin 500-400 MG CAPS Take 1 tablet by mouth daily.     hydrALAZINE (APRESOLINE) 25 MG tablet TAKE 1 TABLET(25 MG) BY MOUTH THREE TIMES DAILY 270 tablet 2   isosorbide mononitrate (IMDUR) 30 MG 24 hr tablet TAKE 1 TABLET(30 MG) BY MOUTH DAILY 90 tablet 3   L-Lysine 1000 MG TABS Take 1,000 mg by mouth daily.      meclizine (ANTIVERT) 25 MG tablet Take 25 mg by mouth daily as needed for dizziness.     Multiple Vitamin (MULTIVITAMIN) tablet Take 1 tablet by mouth daily. Centrum silver     nitroGLYCERIN (NITROSTAT) 0.4 MG SL tablet Place 1 tablet (0.4 mg total) under the tongue every 5 (five) minutes as needed for chest pain. 30 tablet 3   ONE TOUCH ULTRA TEST test strip 1 each by Other route every morning.      ONETOUCH DELICA LANCETS FINE MISC 1 each by Other route every morning.      pantoprazole (PROTONIX) 40 MG tablet Take 40 mg by mouth daily.      potassium chloride SA (KLOR-CON M) 20 MEQ tablet TAKE 2 TABLETS(40 MEQ) BY MOUTH TWICE DAILY 360 tablet 2   RA NATURAL MAGNESIUM 250 MG TABS TAKE 2 TABLETS TWICE DAILY. 120 tablet 5   spironolactone (ALDACTONE) 25 MG tablet TAKE 1 TABLET(25 MG) BY MOUTH DAILY 90 tablet 3   warfarin (COUMADIN) 5 MG tablet TAKE 1 TO 1 AND 1/2 TABLETS BY MOUTH DAILY AS DIRECTED BY COUMADIN CLINIC 120 tablet 1   No current facility-administered medications on file prior to visit.    LABS/IMAGING: No results found for this or any previous visit (from the past 48 hour(s)). No results found.  LIPID PANEL:    Component Value Date/Time   CHOL 129 09/06/2021 1246   CHOL 113 07/22/2014 1226   TRIG 196 (H) 09/06/2021 1246   TRIG 91 07/22/2014 1226   HDL 41 09/06/2021 1246   HDL 40 07/22/2014 1226   CHOLHDL 3.1 09/06/2021 1246   CHOLHDL 3.4 06/04/2015 1216   VLDL 39 (H) 06/04/2015 1216   LDLCALC 56 09/06/2021 1246   LDLCALC 55 07/22/2014 1226   LDLDIRECT 47.9 07/14/2013 0937    WEIGHTS: Wt Readings from Last 3 Encounters:  03/02/22 236 lb  6.4 oz (107.2 kg)  08/19/21 238 lb 3.2 oz (108 kg)  08/19/21 238 lb 3.2  oz (108 kg)    VITALS: BP 123/78 (BP Location: Left Arm, Patient Position: Sitting, Cuff Size: Large)   Pulse 75   Ht 5\' 7"  (1.702 m)   Wt 236 lb 6.4 oz (107.2 kg)   SpO2 95%   BMI 37.03 kg/m   EXAM: Deferred  EKG: Deferred  ASSESSMENT: Mixed dyslipidemia with high triglycerides, goal LDL less than 55 (very high risk) CAD status post CABG and PCI Status post TAVR PAF with pacemaker  PLAN: 1.   Mr. Broz has a mixed dyslipidemia with high triglycerides that persist despite high potency statin.  He was intolerant of Vascepa.  While we could lower his triglycerides with the fibrate, there is no cardiovascular risk reduction associated with this.  His LDL is just slightly above target if 1 would consider him very high risk which I think he is.  That would suggest that he could possibly benefit from additional therapy.  He is also been having leg pain issues which could be related to higher dose atorvastatin.  I have suggested that we try lowering the dose of atorvastatin from 80 to 40 mg (which may be associated with a 6% increase in LDL) and adding ezetimibe 10 mg daily (associated with a 20 to 25% additional LDL reduction and up to 30% reduction in triglycerides).  Hopefully this combination therapy would be more beneficial for him.  We will check a lipid NMR and APO B in 3 to 4 months and follow-up at that time.  He should contact Maryjane Hurter if he has any concerns or side effects with the addition of ezetimibe.  If this is the case, then we might consider clinical trial.  Thanks for the kind referral.  [place], MD, [facility], FACP  [facility]  Medical Director of the [facility]  [facility] Diplomate of the American Board of Clinical Lipidology Attending Cardiologist  Direct Dial: [phone]  Fax: [phone]  Website:  [email or web address]  Brandon Klein 03/02/2022, 4:54 PM

## 2022-03-06 ENCOUNTER — Other Ambulatory Visit: Payer: Self-pay | Admitting: Cardiology

## 2022-03-14 ENCOUNTER — Ambulatory Visit: Payer: Medicare Other | Attending: Cardiology | Admitting: *Deleted

## 2022-03-14 ENCOUNTER — Other Ambulatory Visit: Payer: Self-pay

## 2022-03-14 DIAGNOSIS — Z5181 Encounter for therapeutic drug level monitoring: Secondary | ICD-10-CM | POA: Diagnosis not present

## 2022-03-14 DIAGNOSIS — I480 Paroxysmal atrial fibrillation: Secondary | ICD-10-CM | POA: Diagnosis not present

## 2022-03-14 LAB — POCT INR: INR: 2.4 (ref 2.0–3.0)

## 2022-03-14 MED ORDER — POTASSIUM CHLORIDE CRYS ER 20 MEQ PO TBCR: EXTENDED_RELEASE_TABLET | ORAL | 1 refills | Status: DC

## 2022-03-14 MED ORDER — FUROSEMIDE 20 MG PO TABS: ORAL_TABLET | ORAL | 1 refills | Status: DC

## 2022-03-14 MED ORDER — FUROSEMIDE 80 MG PO TABS: ORAL_TABLET | ORAL | 1 refills | Status: DC

## 2022-03-14 NOTE — Addendum Note (Signed)
Addended by: Carter Kitten D on: 03/14/2022 03:34 PM   Modules accepted: Orders

## 2022-03-14 NOTE — Patient Instructions (Signed)
Description   Continue taking Warfarin 1 tablet everyday except 1.5 tablets on Mondays, Wednesdays, and Fridays. Recheck INR in 6 weeks. Coumadin clinic [phone].

## 2022-04-02 ENCOUNTER — Other Ambulatory Visit: Payer: Self-pay | Admitting: Cardiology

## 2022-04-08 DIAGNOSIS — S40022A Contusion of left upper arm, initial encounter: Secondary | ICD-10-CM | POA: Diagnosis not present

## 2022-04-11 DIAGNOSIS — Z23 Encounter for immunization: Secondary | ICD-10-CM | POA: Diagnosis not present

## 2022-04-11 DIAGNOSIS — Z0000 Encounter for general adult medical examination without abnormal findings: Secondary | ICD-10-CM | POA: Diagnosis not present

## 2022-04-13 DIAGNOSIS — Z03818 Encounter for observation for suspected exposure to other biological agents ruled out: Secondary | ICD-10-CM | POA: Diagnosis not present

## 2022-04-13 DIAGNOSIS — S40022S Contusion of left upper arm, sequela: Secondary | ICD-10-CM | POA: Diagnosis not present

## 2022-04-13 DIAGNOSIS — R0989 Other specified symptoms and signs involving the circulatory and respiratory systems: Secondary | ICD-10-CM | POA: Diagnosis not present

## 2022-04-13 DIAGNOSIS — R350 Frequency of micturition: Secondary | ICD-10-CM | POA: Diagnosis not present

## 2022-04-14 DIAGNOSIS — Z9181 History of falling: Secondary | ICD-10-CM | POA: Diagnosis not present

## 2022-04-14 DIAGNOSIS — D6869 Other thrombophilia: Secondary | ICD-10-CM | POA: Diagnosis not present

## 2022-04-14 DIAGNOSIS — D692 Other nonthrombocytopenic purpura: Secondary | ICD-10-CM | POA: Diagnosis not present

## 2022-04-14 DIAGNOSIS — T148XXA Other injury of unspecified body region, initial encounter: Secondary | ICD-10-CM | POA: Diagnosis not present

## 2022-04-14 DIAGNOSIS — N1832 Chronic kidney disease, stage 3b: Secondary | ICD-10-CM | POA: Diagnosis not present

## 2022-04-14 DIAGNOSIS — R051 Acute cough: Secondary | ICD-10-CM | POA: Diagnosis not present

## 2022-04-14 DIAGNOSIS — E1122 Type 2 diabetes mellitus with diabetic chronic kidney disease: Secondary | ICD-10-CM | POA: Diagnosis not present

## 2022-04-25 ENCOUNTER — Ambulatory Visit: Payer: Medicare Other | Attending: Cardiology

## 2022-04-25 DIAGNOSIS — I480 Paroxysmal atrial fibrillation: Secondary | ICD-10-CM | POA: Diagnosis not present

## 2022-04-25 DIAGNOSIS — Z5181 Encounter for therapeutic drug level monitoring: Secondary | ICD-10-CM

## 2022-04-25 LAB — POCT INR: INR: 2.8 (ref 2.0–3.0)

## 2022-04-25 NOTE — Patient Instructions (Signed)
Continue taking Warfarin 1 tablet everyday except 1.5 tablets on Mondays, Wednesdays, and Fridays. Recheck INR in 6 weeks. Coumadin clinic [phone].

## 2022-05-02 ENCOUNTER — Ambulatory Visit (INDEPENDENT_AMBULATORY_CARE_PROVIDER_SITE_OTHER): Payer: Medicare Other

## 2022-05-02 DIAGNOSIS — I480 Paroxysmal atrial fibrillation: Secondary | ICD-10-CM | POA: Diagnosis not present

## 2022-05-02 LAB — CUP PACEART REMOTE DEVICE CHECK
Battery Impedance: 2246 Ohm
Battery Remaining Longevity: 33 mo
Battery Voltage: 2.74 V
Brady Statistic AP VP Percent: 0 %
Brady Statistic AP VS Percent: 80 %
Brady Statistic AS VP Percent: 0 %
Brady Statistic AS VS Percent: 19 %
Date Time Interrogation Session: 20231205115437
Implantable Lead Connection Status: 753985
Implantable Lead Connection Status: 753985
Implantable Lead Implant Date: 20020926
Implantable Lead Implant Date: 20020926
Implantable Lead Location: 753859
Implantable Lead Location: 753860
Implantable Lead Model: 5076
Implantable Lead Model: 5092
Implantable Pulse Generator Implant Date: 20120905
Lead Channel Impedance Value: 523 Ohm
Lead Channel Impedance Value: 989 Ohm
Lead Channel Pacing Threshold Amplitude: 0.625 V
Lead Channel Pacing Threshold Amplitude: 1.125 V
Lead Channel Pacing Threshold Pulse Width: 0.4 ms
Lead Channel Pacing Threshold Pulse Width: 0.4 ms
Lead Channel Setting Pacing Amplitude: 2 V
Lead Channel Setting Pacing Amplitude: 2.5 V
Lead Channel Setting Pacing Pulse Width: 0.4 ms
Lead Channel Setting Sensing Sensitivity: 4 mV
Zone Setting Status: 755011
Zone Setting Status: 755011

## 2022-05-30 NOTE — Progress Notes (Signed)
Remote pacemaker transmission.   

## 2022-06-06 ENCOUNTER — Ambulatory Visit: Payer: Medicare Other | Attending: Cardiology | Admitting: *Deleted

## 2022-06-06 DIAGNOSIS — Z5181 Encounter for therapeutic drug level monitoring: Secondary | ICD-10-CM | POA: Diagnosis not present

## 2022-06-06 DIAGNOSIS — I480 Paroxysmal atrial fibrillation: Secondary | ICD-10-CM | POA: Diagnosis not present

## 2022-06-06 LAB — POCT INR: INR: 2 (ref 2.0–3.0)

## 2022-06-06 NOTE — Patient Instructions (Signed)
Description   Today take 1.5 tablets then continue taking Warfarin 1 tablet everyday except 1.5 tablets on Mondays, Wednesdays, and Fridays. Recheck INR in 6 weeks. Coumadin clinic [phone].

## 2022-06-26 DIAGNOSIS — D485 Neoplasm of uncertain behavior of skin: Secondary | ICD-10-CM | POA: Diagnosis not present

## 2022-07-11 DIAGNOSIS — E785 Hyperlipidemia, unspecified: Secondary | ICD-10-CM | POA: Diagnosis not present

## 2022-07-12 LAB — NMR, LIPOPROFILE
Cholesterol, Total: 127 mg/dL (ref 100–199)
HDL Particle Number: 39.4 umol/L (ref >=30.5)
HDL-C: 48 mg/dL (ref >39)
LDL Particle Number: 820 nmol/L (ref <1000)
LDL Size: 19.8 nm — ABNORMAL LOW (ref >20.5)
LDL-C (NIH Calc): 48 mg/dL (ref 0–99)
LP-IR Score: 70 — ABNORMAL HIGH (ref <=45)
Small LDL Particle Number: 579 nmol/L — ABNORMAL HIGH (ref <=527)
Triglycerides: 192 mg/dL — ABNORMAL HIGH (ref 0–149)

## 2022-07-12 LAB — LIPID PANEL
Chol/HDL Ratio: 2.8 ratio (ref 0.0–5.0)
Cholesterol, Total: 131 mg/dL (ref 100–199)
HDL: 47 mg/dL (ref >39)
LDL Chol Calc (NIH): 53 mg/dL (ref 0–99)
Triglycerides: 190 mg/dL — ABNORMAL HIGH (ref 0–149)
VLDL Cholesterol Cal: 31 mg/dL (ref 5–40)

## 2022-07-12 LAB — APOLIPOPROTEIN B: Apolipoprotein B: 65 mg/dL (ref <90)

## 2022-07-18 ENCOUNTER — Ambulatory Visit: Payer: Medicare Other | Attending: Cardiology | Admitting: *Deleted

## 2022-07-18 DIAGNOSIS — I480 Paroxysmal atrial fibrillation: Secondary | ICD-10-CM | POA: Diagnosis not present

## 2022-07-18 DIAGNOSIS — Z5181 Encounter for therapeutic drug level monitoring: Secondary | ICD-10-CM | POA: Diagnosis not present

## 2022-07-18 LAB — POCT INR: POC INR: 1.9

## 2022-07-18 NOTE — Patient Instructions (Signed)
Description   Today take 1.5 tablets then continue taking Warfarin 1 tablet everyday except 1.5 tablets on Mondays, Wednesdays, and Fridays. Recheck INR in 4 weeks. Coumadin clinic [phone].

## 2022-07-25 ENCOUNTER — Ambulatory Visit (HOSPITAL_BASED_OUTPATIENT_CLINIC_OR_DEPARTMENT_OTHER): Payer: Medicare Other | Admitting: Internal Medicine

## 2022-07-25 ENCOUNTER — Encounter (HOSPITAL_BASED_OUTPATIENT_CLINIC_OR_DEPARTMENT_OTHER): Payer: Self-pay | Admitting: Internal Medicine

## 2022-07-25 VITALS — BP 127/77 | HR 85 | Ht 67.0 in | Wt 235.2 lb

## 2022-07-25 DIAGNOSIS — I2510 Atherosclerotic heart disease of native coronary artery without angina pectoris: Secondary | ICD-10-CM

## 2022-07-25 DIAGNOSIS — E785 Hyperlipidemia, unspecified: Secondary | ICD-10-CM | POA: Diagnosis not present

## 2022-07-25 DIAGNOSIS — E781 Pure hyperglyceridemia: Secondary | ICD-10-CM

## 2022-07-25 NOTE — Progress Notes (Signed)
LIPID CLINIC CONSULT NOTE  Chief Complaint:  Follow-up triglycerides  Primary Care Physician: Kathyrn Lass, MD  Primary Cardiologist:  Fransico Him, MD  HPI:  Brandon Klein is a 82 y.o. male who is being seen today for the evaluation of high triglycerides at the request of Kathyrn Lass, MD. this is a pleasant 82 year old male kindly referred by Dr. Radford Pax for evaluation of high triglycerides.  He has a history of extensive coronary artery disease with prior CABG and PCI, chronic kidney disease, hypertension, dyslipidemia, prior pacemaker or any atrial fibrillation, and more recently TAVR.  He reports longstanding high triglycerides.  He notes that despite multiple medications his triglycerides have always remain elevated.  Currently he is on 80 mg of atorvastatin daily which recently had been increased from 40 mg.  He had previously seen a several of the Pharm.D.'s in our lipid clinic.  He was trialed on Vascepa which he tried 3 different times but had severe reactions including myalgias and other side effects and said that he would absolutely never take that medication again.  This is the only specific triglyceride lowering medication that has cardiovascular risk reduction data.  We did discuss his triglycerides and I agree that persistently elevated triglycerides are associated with increased cardiovascular risk.  We also discussed possible options today including a clinical trial to lower triglycerides as well as alternative therapies.  He has reported some leg pain issues which might be related to his atorvastatin especially at very high dose.  07/25/2022  [name] returns today for follow-up.  He seems to be tolerating the adjustment in his medications.  Since he has been having some leg pain, I felt that it might be related to his statin.  I advise decreasing the atorvastatin from 80 to 40 mg daily but adding ezetimibe.  Hoping that combination therapy would give him similar to increase  benefits.  He is repeat lipids show total cholesterol 237, triglycerides 190, HDL 47 and LDL 53.  Overall this is reasonably well treated and his triglycerides remain somewhat in range of where they have been in the past.  As mentioned previously other than Vascepa no other triglyceride lowering medication show cardiovascular risk reduction.  Currently his APO B is at target at 9.  NMR lipid profile also shows an LDL particle number of 820 with a slightly lower LDL-C at 48.  PMHx:  Past Medical History:  Diagnosis Date   Chronic renal insufficiency    Coronary artery disease    a. s/p PCI of LAD 1997. b. Cutting balloon angioplasty to LCx in 2002. c. s/p cath 11/2013 with severe 3 vessel ASCAD s/p CABG with LIMA to LAD, SVG to diag, SVG to left circ and SVG to PDA. d. cath 07/07/2014 occluded SVG to PDA, all other grafts patent. 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  with benign gastric polypectomy 05/2014.   HLD (hyperlipidemia)    HTN (hypertension)    Obesity    OSA (obstructive sleep apnea)    Pacemaker    a. 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 b. MDT generator replacement 2012.   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 vasovagal with documented bradycardia.   Type 2 diabetes mellitus without complications ([place] [id number]   Past Surgical History:  Procedure Laterality Date   ANGIOPLASTY  1997   CHOLECYSTECTOMY     COLONOSCOPY WITH PROPOFOL N/A 05/05/2014   Procedure: COLONOSCOPY WITH PROPOFOL;  Surgeon: MGarlan Fair MD;   Location: [facility];  Service: Endoscopy;  Laterality: N/A;   CORONARY ARTERY BYPASS GRAFT N/A 12/01/2013   Procedure: CORONARY ARTERY BYPASS GRAFT TIMES FOUR USING LEFT INTERNAL MAMMARY ARTERY TO LAD, SAPHENOUS VEIN GRAFTS TO DIAGONAL, CIRCUMFELX AND POSTERIOR DESCENDING;  Surgeon: PIvin Poot MD;  [place] [facility]  Service: Open Heart Surgery;  Laterality: N/A;   CORONARY STENT INTERVENTION N/A 03/21/2019   Procedure: CORONARY STENT INTERVENTION;  Surgeon: MBurnell Blanks MD;  [facility];  Service: Cardiovascular;  Laterality: N/A;  Distal RCA   ESOPHAGOGASTRODUODENOSCOPY (EGD) WITH PROPOFOL N/A 05/05/2014   Procedure: ESOPHAGOGASTRODUODENOSCOPY (EGD) WITH PROPOFOL;  Surgeon: MGarlan Fair MD;  Location: [facility];  Service: Endoscopy;  Laterality: N/A;   KNEE ARTHROSCOPY  2000   Left   LEFT AND RIGHT HEART CATHETERIZATION WITH CORONARY/GRAFT ANGIOGRAM N/A 07/07/2014   Procedure: LEFT AND RIGHT HEART CATHETERIZATION WITH CBeatrix Klein  Surgeon: CBurnell Blanks MD;  [facility];  Service: Cardiovascular;  Laterality: N/A;   LEFT HEART CATHETERIZATION WITH CORONARY ANGIOGRAM N/A 11/25/2013   Procedure: LEFT HEART CATHETERIZATION WITH CORONARY ANGIOGRAM;  Surgeon: TSueanne Margarita MD;  [facility];  Service: Cardiovascular;  Laterality: N/A;   PACEMAKER INSERTION  2002   generator change MDT by Dr KCaryl Comesin 2012   RIGHT/LEFT HEART CATH AND CORONARY/GRAFT ANGIOGRAPHY N/A 03/21/2019   Procedure: RIGHT/LEFT HEART CATH AND CORONARY/GRAFT ANGIOGRAPHY;  Surgeon: MBurnell Blanks MD;  [facility];  Service: Cardiovascular;  Laterality: N/A;   TEE WITHOUT CARDIOVERSION N/A 08/31/2014   Procedure: TRANSESOPHAGEAL ECHOCARDIOGRAM (TEE);  Surgeon: TSueanne Margarita MD;  [facility];  Service: Cardiovascular;  Laterality: N/A;   TEE WITHOUT CARDIOVERSION N/A 04/08/2019   Procedure: TRANSESOPHAGEAL  ECHOCARDIOGRAM (TEE);  Surgeon: MBurnell Blanks MD;  [place] [facility]  Service: Open Heart Surgery;  Laterality: N/A;   TRANSCATHETER AORTIC VALVE REPLACEMENT, TRANSFEMORAL  04/08/2019   TRANSCATHETER AORTIC VALVE REPLACEMENT, TRANSFEMORAL N/A 04/08/2019   Procedure: TRANSCATHETER AORTIC VALVE REPLACEMENT, TRANSFEMORAL;  Surgeon: MBurnell Blanks MD;  [facility]  Service: Open Heart Surgery;  Laterality: N/A;    FAMHx:  Family History  Problem Relation Age of Onset   Colon cancer Mother        deceased   Heart attack Mother    Leukemia Father        deceased   Heart attack Brother        deceased    SOCHx:   reports that he quit smoking about 56 years ago. His smoking use included cigarettes. He has a 10.00 pack-year smoking history. He has never used smokeless tobacco. He reports current alcohol use. He reports that he does not use drugs.  ALLERGIES:  Allergies  Allergen Reactions   Vascepa [Icosapent Ethyl]     Myalgias, TRIED 3 TIMES   Acetaminophen-Codeine Other (See Comments)    Extreme constipation Extreme constipation  Amoxicillin Itching and Rash    Did it involve swelling of the face/tongue/throat, SOB, or low BP? No Did it involve sudden or severe rash/hives, skin peeling, or any reaction on the inside of your mouth or nose? Unknown  Did you need to seek medical attention at a hospital or doctor's office? No When did it last happen?More than 40 years ago        If all above answers are "NO", may proceed with cephalosporin use.    Codeine Nausea And Vomiting   Diovan [Valsartan] Other (See Comments)    Unknown     ROS: Pertinent items noted in HPI and remainder of comprehensive ROS otherwise negative.  HOME MEDS: Current Outpatient Medications on File Prior to Visit  Medication Sig Dispense Refill   Acetaminophen (TYLENOL ARTHRITIS PAIN PO) Take 1,300 mg by mouth 3 (three) times daily as needed.     aspirin 81 MG EC tablet Take 81 mg  by mouth daily. Swallow whole.     atorvastatin (LIPITOR) 40 MG tablet Take 1 tablet (40 mg total) by mouth daily. 90 tablet 3   azithromycin (ZITHROMAX) 500 MG tablet TAKE 1 TABLET BY MOUTH 1 HOUR BEFORE ANY DENTAL WORK INCLUDING CLEANINGS 6 tablet 6   carvedilol (COREG) 25 MG tablet TAKE 1 TABLET(25 MG) BY MOUTH TWICE DAILY WITH A MEAL 180 tablet 3   colchicine 0.6 MG tablet as directed. Take 1 tablet by mouth twice daily and 2 tablets at onset of Gout     docusate sodium (COLACE) 100 MG capsule Take 200 mg by mouth daily as needed for mild constipation.      ezetimibe (ZETIA) 10 MG tablet Take 1 tablet (10 mg total) by mouth daily. 90 tablet 3   fexofenadine (ALLEGRA) 180 MG tablet Take 180 mg by mouth daily.      fluticasone (FLONASE) 50 MCG/ACT nasal spray Place 2 sprays into the nose daily.      furosemide (LASIX) 20 MG tablet TAKE 1 TABLET BY MOUTH TWICE DAILY ALONG WITH 80 MG TABLET 180 tablet 1   furosemide (LASIX) 80 MG tablet TAKE 1 TABLET BY MOUTH TWICE DAILY WITH 20 MG TABLET 180 tablet 1   Glucosamine-Chondroitin 500-400 MG CAPS Take 1 tablet by mouth daily.     hydrALAZINE (APRESOLINE) 25 MG tablet TAKE 1 TABLET(25 MG) BY MOUTH THREE TIMES DAILY 270 tablet 1   isosorbide mononitrate (IMDUR) 30 MG 24 hr tablet TAKE 1 TABLET(30 MG) BY MOUTH DAILY 90 tablet 3   L-Lysine 1000 MG TABS Take 1,000 mg by mouth daily.      meclizine (ANTIVERT) 25 MG tablet Take 25 mg by mouth daily as needed for dizziness.     Multiple Vitamin (MULTIVITAMIN) tablet Take 1 tablet by mouth daily. Centrum silver     nitroGLYCERIN (NITROSTAT) 0.4 MG SL tablet Place 1 tablet (0.4 mg total) under the tongue every 5 (five) minutes as needed for chest pain. 30 tablet 3   ONE TOUCH ULTRA TEST test strip 1 each by Other route every morning.      ONETOUCH DELICA LANCETS FINE MISC 1 each by Other route every morning.      pantoprazole (PROTONIX) 40 MG tablet Take 40 mg by mouth daily.      potassium chloride SA  (KLOR-CON M) 20 MEQ tablet TAKE 2 TABLETS(40 MEQ) BY MOUTH TWICE DAILY 360 tablet 1   RA NATURAL MAGNESIUM 250 MG TABS TAKE 2 TABLETS TWICE DAILY. 120 tablet 5   spironolactone (  ALDACTONE) 25 MG tablet TAKE 1 TABLET(25 MG) BY MOUTH DAILY 90 tablet 3   warfarin (COUMADIN) 5 MG tablet TAKE 1 TO 1 AND 1/2 TABLETS BY MOUTH DAILY AS DIRECTED BY COUMADIN CLINIC 120 tablet 1   No current facility-administered medications on file prior to visit.    LABS/IMAGING: No results found for this or any previous visit (from the past 48 hour(s)). No results found.  LIPID PANEL:    Component Value Date/Time   CHOL 131 07/11/2022 1118   CHOL 113 07/22/2014 1226   TRIG 190 (H) 07/11/2022 1118   TRIG 91 07/22/2014 1226   HDL 47 07/11/2022 1118   HDL 40 07/22/2014 1226   CHOLHDL 2.8 07/11/2022 1118   CHOLHDL 3.4 06/04/2015 1216   VLDL 39 (H) 06/04/2015 1216   LDLCALC 53 07/11/2022 1118   LDLCALC 55 07/22/2014 1226   LDLDIRECT 47.9 07/14/2013 0937    WEIGHTS: Wt Readings from Last 3 Encounters:  07/25/22 235 lb 3.2 oz (106.7 kg)  03/02/22 236 lb 6.4 oz (107.2 kg)  08/19/21 238 lb 3.2 oz (108 kg)    VITALS: BP 127/77 (BP Location: Left Arm, Patient Position: Sitting, Cuff Size: Large)   Pulse 85   Ht '5\' 7"'$  (1.702 m)   Wt 235 lb 3.2 oz (106.7 kg)   SpO2 95%   BMI 36.84 kg/m   EXAM: Deferred  EKG: Deferred  ASSESSMENT: Mixed dyslipidemia with high triglycerides, goal LDL less than 55 (very high risk) CAD status post CABG and PCI Status post TAVR PAF with pacemaker  PLAN: 1.   Mr. Bernick reports he does not note a significant difference in leg pain symptoms after reducing the dose of his statin but seems to also be tolerating combination therapy of atorvastatin and ezetimibe and has had significant improvements in his lipids.  Triglycerides remain mildly elevated but I am not particularly concerned with that.  He is LDL-C is 48 based on his NMR profile with a particle number of 820 which  represents good control.  I would continue with his current combination therapy and he can follow-up with his primary cardiologist.  Thanks again for allow me to participate in his care.  Pixie Casino, MD, [facility], [place] Director of the Advanced Lipid Disorders &  Cardiovascular Risk Reduction Clinic Diplomate of the American Board of Clinical Lipidology Attending Cardiologist  Direct Dial: [phone]  Fax: [phone]  Website:  [email or web address] 07/25/2022, 2:45 PM

## 2022-07-25 NOTE — Patient Instructions (Signed)
Medication Instructions:  Continue current medications  *If you need a refill on your cardiac medications before your next appointment, please call your pharmacy*   Lab Work: None Ordered    Testing/Procedures: None Ordered   Follow-Up: At [facility], you and your health needs are our priority.  As part of our continuing mission to provide you with exceptional heart care, we have created designated Provider Care Teams.  These Care Teams include your primary Cardiologist (physician) and Advanced Practice Providers (APPs -  Physician Assistants and Nurse Practitioners) who all work together to provide you with the care you need, when you need it.  We recommend signing up for the patient portal called "MyChart".  Sign up information is provided on this After Visit Summary.  MyChart is used to connect with patients for Virtual Visits (Telemedicine).  Patients are able to view lab/test results, encounter notes, upcoming appointments, etc.  Non-urgent messages can be sent to your provider as well.   To learn more about what you can do with MyChart, go to [email or web address].    Your next appointment:   As Needed       

## 2022-07-27 DIAGNOSIS — L570 Actinic keratosis: Secondary | ICD-10-CM | POA: Diagnosis not present

## 2022-07-27 DIAGNOSIS — T1490XD Injury, unspecified, subsequent encounter: Secondary | ICD-10-CM | POA: Diagnosis not present

## 2022-07-27 DIAGNOSIS — L821 Other seborrheic keratosis: Secondary | ICD-10-CM | POA: Diagnosis not present

## 2022-07-30 ENCOUNTER — Other Ambulatory Visit: Payer: Self-pay | Admitting: Cardiology

## 2022-07-30 DIAGNOSIS — I480 Paroxysmal atrial fibrillation: Secondary | ICD-10-CM

## 2022-07-31 NOTE — Telephone Encounter (Signed)
Prescription refill request received for warfarin Lov: hilty, 07/25/2022  Next INR check: 3/19 Warfarin tablet strength: '5mg'$ 

## 2022-08-01 ENCOUNTER — Ambulatory Visit (INDEPENDENT_AMBULATORY_CARE_PROVIDER_SITE_OTHER): Payer: Medicare Other

## 2022-08-01 DIAGNOSIS — I5042 Chronic combined systolic (congestive) and diastolic (congestive) heart failure: Secondary | ICD-10-CM | POA: Diagnosis not present

## 2022-08-02 LAB — CUP PACEART REMOTE DEVICE CHECK
Battery Impedance: 2276 Ohm
Battery Remaining Longevity: 33 mo
Battery Voltage: 2.74 V
Brady Statistic AP VP Percent: 0 %
Brady Statistic AP VS Percent: 81 %
Brady Statistic AS VP Percent: 0 %
Brady Statistic AS VS Percent: 19 %
Date Time Interrogation Session: 20240305161900
Implantable Lead Connection Status: 753985
Implantable Lead Connection Status: 753985
Implantable Lead Implant Date: 20020926
Implantable Lead Implant Date: 20020926
Implantable Lead Location: 753859
Implantable Lead Location: 753860
Implantable Lead Model: 5076
Implantable Lead Model: 5092
Implantable Pulse Generator Implant Date: 20120905
Lead Channel Impedance Value: 507 Ohm
Lead Channel Impedance Value: 989 Ohm
Lead Channel Pacing Threshold Amplitude: 0.625 V
Lead Channel Pacing Threshold Amplitude: 1.125 V
Lead Channel Pacing Threshold Pulse Width: 0.4 ms
Lead Channel Pacing Threshold Pulse Width: 0.4 ms
Lead Channel Setting Pacing Amplitude: 2 V
Lead Channel Setting Pacing Amplitude: 2.5 V
Lead Channel Setting Pacing Pulse Width: 0.4 ms
Lead Channel Setting Sensing Sensitivity: 4 mV
Zone Setting Status: 755011
Zone Setting Status: 755011

## 2022-08-06 NOTE — Progress Notes (Signed)
Cardiology Office Note Date:  08/06/2022  Patient ID:  Brandon Klein, Brandon Klein 07/29/1940, MRN [id number] PCP:  Kathyrn Lass, MD  Cardiologist:  Dr. Radford Pax Electrophysiologist: Dr. Caryl Comes Lipid clinic: Dr. Debara Pickett   Chief Complaint:   *** Annual device visit  History of Present Illness: Brandon Klein is a 82 y.o. 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 Caryl Comes, last seen by him Jan 2020.  At that visit he was doing well, no changes were made to his programming or medicines.  Since then he has undergone TAVR procedure Nov 2020.  I saw him Jan 2021 He feels well.  Mentions he had cataract surgery and this has been huge for him, sees well has had a couple aura like symptoms the eye MD told his was likely a headache,  He has felt well since his PCI and TAVR.  Denies any CP, palpitations or cardiac awareness.  No dizziness (outside of his known occasional vertigo), no near syncope or syncope.   He says his knees are his primary exertional limitations, no SOB or DOE. He will bleed more with trauma, minor skin scrapes on the Plavix with his warfarin, otherwise no bleeding or signs of bleeding.  Follows with the coumadin clinic No changes were made, planned to see Dr. Radford Pax as scheduled, remotes as usual and EP annually.  He saw K. Grandville Silos, [place] 02/12/20, doing very well, exercising regularly, lost weight.  TTE again with LVEF 40-45%, grade I DD, valve was OK.  Updated endocarditis prophylaxis with the patient, otherwise no changes.  I saw him Jan 2022 He is doing quite well. Continues to exercise at the [facility] regularly with very good exertional capacity. Home weight is stable No CP. He has PVCs that he feels, has had them for many years,  uhanged behavier, here and there No palpitations otherwise. No dizzy spells, near syncope or syncope. No bleeding or signs of bleeding PMD manages labs No changes were made, planned for labs  He saw Dr. Radford Pax March 2022, again feeling well, no changes were made.  He saw Gerrianne Scale, PA-C, Sept 2022, urinary frequency/urgency prompted him not using lasix as often and dose reduced some for him  I saw him 08/03/21 He is doing well, when he reduced his lasix dose, his weight started to creep back up and he resumed his usual dosing.  Working on the urinary urgency and slight incontinence. No CP, denies SOB He does not think he has had any AFib, but perhaps some PVCs now and then. No near syncope or syncope. No bleeding or signs of bleeding Had labs with his PMD Nov 2022 No changes were made.  He saw Dr. Radford Pax 08/19/21, doing well, [facility], planned for an updated echo and lipids/labs  Echo was stable,  LVEF 40-45%, mild RV dysfunction, noted perivalvular leak (not new),  trivial AI  *** device only *** needs to see SK next *** symptoms *** AF burden *** RV pacing burden *** volume *** sees TT in summer *** warfarin, bleeding,, [place]   Device information: MDT dual chamber PPM, implanted 02/21/01, gen change 02/01/11, Dr. Lovena Le, followed by Dr. Caryl Comes   Past Medical  History:  Diagnosis Date   Chronic renal insufficiency    Coronary artery disease    a. s/p PCI of LAD 1997. b. Cutting balloon angioplasty to LCx in 2002. c. s/p cath 11/2013 with severe 3 vessel ASCAD s/p CABG with LIMA to LAD, SVG to diag, SVG to left circ and SVG to PDA. d. cath 07/07/2014 occluded SVG to PDA, all other grafts patent. 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  with benign gastric polypectomy 05/2014.   HLD (hyperlipidemia)    HTN (hypertension)    Obesity    OSA (obstructive sleep apnea)    Pacemaker     a. 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 b. MDT generator replacement 2012.   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 vasovagal with documented bradycardia.   [place] 2 [place] mellitus [place] complications [place] [id number]   Past Surgical History:  Procedure Laterality Date   ANGIOPLASTY  1997   CHOLECYSTECTOMY     COLONOSCOPY WITH PROPOFOL N/A 05/05/2014   Procedure: COLONOSCOPY WITH PROPOFOL;  Surgeon: MGarlan Fair MD;  Location: [facility];  Service: Endoscopy;  Laterality: N/A;   CORONARY ARTERY BYPASS GRAFT N/A 12/01/2013   Procedure: CORONARY ARTERY BYPASS GRAFT TIMES FOUR USING LEFT INTERNAL MAMMARY ARTERY TO LAD, SAPHENOUS VEIN GRAFTS TO DIAGONAL, CIRCUMFELX AND POSTERIOR DESCENDING;  Surgeon: PIvin Poot MD;  [facility]  Service: Open Heart Surgery;  Laterality: N/A;   CORONARY STENT INTERVENTION N/A 03/21/2019   Procedure: CORONARY STENT INTERVENTION;  Surgeon: MBurnell Blanks MD;  [facility];  Service: Cardiovascular;  Laterality: N/A;  Distal RCA   ESOPHAGOGASTRODUODENOSCOPY (EGD) WITH PROPOFOL N/A 05/05/2014   Procedure: ESOPHAGOGASTRODUODENOSCOPY (EGD) WITH PROPOFOL;  Surgeon: MGarlan Fair MD;  Location: [facility];  Service: Endoscopy;  Laterality: N/A;   KNEE ARTHROSCOPY  2000   Left   LEFT AND RIGHT HEART CATHETERIZATION WITH CORONARY/GRAFT ANGIOGRAM N/A 07/07/2014   Procedure: LEFT AND RIGHT HEART CATHETERIZATION WITH CBeatrix Fetters  Surgeon: CBurnell Blanks MD;  [facility];  Service: Cardiovascular;  Laterality: N/A;   LEFT HEART  CATHETERIZATION WITH CORONARY ANGIOGRAM N/A 11/25/2013   Procedure: LEFT HEART CATHETERIZATION WITH CORONARY ANGIOGRAM;  Surgeon: TSueanne Margarita MD;  [facility];  Service: Cardiovascular;  Laterality: N/A;   PACEMAKER INSERTION  2002   generator change MDT by Dr KCaryl Comesin 2012   RIGHT/LEFT HEART CATH AND CORONARY/GRAFT ANGIOGRAPHY N/A 03/21/2019   Procedure: RIGHT/LEFT HEART CATH AND CORONARY/GRAFT ANGIOGRAPHY;  Surgeon: MBurnell Blanks MD;  [facility];  Service: Cardiovascular;  Laterality: N/A;   TEE WITHOUT CARDIOVERSION N/A 08/31/2014   Procedure: TRANSESOPHAGEAL ECHOCARDIOGRAM (TEE);  Surgeon: TSueanne Margarita MD;  [facility];  Service: Cardiovascular;  Laterality: N/A;   TEE WITHOUT CARDIOVERSION N/A 04/08/2019   Procedure: TRANSESOPHAGEAL ECHOCARDIOGRAM (TEE);  Surgeon: MBurnell Blanks MD;  [facility] [facility]  Service:  Open Heart Surgery;  Laterality: N/A;   TRANSCATHETER AORTIC VALVE REPLACEMENT, TRANSFEMORAL  04/08/2019   TRANSCATHETER AORTIC VALVE REPLACEMENT, TRANSFEMORAL N/A 04/08/2019   Procedure: TRANSCATHETER AORTIC VALVE REPLACEMENT, TRANSFEMORAL;  Surgeon: Burnell Blanks, MD;  Location: [facility];  Service: Open Heart Surgery;  Laterality: N/A;    Current Outpatient Medications  Medication Sig Dispense Refill   Acetaminophen (TYLENOL ARTHRITIS PAIN PO) Take 1,300 mg by mouth 3 (three) times daily as needed.     aspirin 81 MG EC tablet Take 81 mg by mouth daily. Swallow whole.     atorvastatin (LIPITOR) 40 MG tablet Take 1 tablet (40 mg total) by mouth daily. 90 tablet 3   azithromycin (ZITHROMAX) 500 MG tablet TAKE 1 TABLET BY MOUTH 1 HOUR BEFORE ANY DENTAL WORK INCLUDING CLEANINGS 6 tablet 6   carvedilol (COREG) 25 MG tablet TAKE 1 TABLET(25 MG) BY MOUTH TWICE DAILY WITH A MEAL 180 tablet 3   colchicine 0.6 MG tablet as directed. Take 1 tablet by mouth twice daily and 2 tablets at onset of Gout     docusate sodium  (COLACE) 100 MG capsule Take 200 mg by mouth daily as needed for mild constipation.      ezetimibe (ZETIA) 10 MG tablet Take 1 tablet (10 mg total) by mouth daily. 90 tablet 3   fexofenadine (ALLEGRA) 180 MG tablet Take 180 mg by mouth daily.      fluticasone (FLONASE) 50 MCG/ACT nasal spray Place 2 sprays into the nose daily.      furosemide (LASIX) 20 MG tablet TAKE 1 TABLET BY MOUTH TWICE DAILY ALONG WITH 80 MG TABLET 180 tablet 1   furosemide (LASIX) 80 MG tablet TAKE 1 TABLET BY MOUTH TWICE DAILY WITH 20 MG TABLET 180 tablet 1   Glucosamine-Chondroitin 500-400 MG CAPS Take 1 tablet by mouth daily.     hydrALAZINE (APRESOLINE) 25 MG tablet TAKE 1 TABLET(25 MG) BY MOUTH THREE TIMES DAILY 270 tablet 1   isosorbide mononitrate (IMDUR) 30 MG 24 hr tablet TAKE 1 TABLET(30 MG) BY MOUTH DAILY 90 tablet 3   L-Lysine 1000 MG TABS Take 1,000 mg by mouth daily.      meclizine (ANTIVERT) 25 MG tablet Take 25 mg by mouth daily as needed for dizziness.     Multiple Vitamin (MULTIVITAMIN) tablet Take 1 tablet by mouth daily. Centrum silver     nitroGLYCERIN (NITROSTAT) 0.4 MG SL tablet Place 1 tablet (0.4 mg total) under the tongue every 5 (five) minutes as needed for chest pain. 30 tablet 3   ONE TOUCH ULTRA TEST test strip 1 each by Other route every morning.      ONETOUCH DELICA LANCETS FINE MISC 1 each by Other route every morning.      pantoprazole (PROTONIX) 40 MG tablet Take 40 mg by mouth daily.      potassium chloride SA (KLOR-CON M) 20 MEQ tablet TAKE 2 TABLETS(40 MEQ) BY MOUTH TWICE DAILY 360 tablet 1   RA NATURAL MAGNESIUM 250 MG TABS TAKE 2 TABLETS TWICE DAILY. 120 tablet 5   spironolactone (ALDACTONE) 25 MG tablet TAKE 1 TABLET(25 MG) BY MOUTH DAILY 90 tablet 3   warfarin (COUMADIN) 5 MG tablet TAKE 1 TO 1 AND 1/2 TABLETS BY MOUTH DAILY AS DIRECTED BY COUMADIN CLINIC 120 tablet 0   No current facility-administered medications for this visit.    Allergies:   Vascepa [icosapent ethyl],  Acetaminophen-codeine, Amoxicillin, Codeine, and Diovan [valsartan]   Social History:  The patient  reports  that he quit smoking about 56 years ago. His smoking use included cigarettes. He has a 10.00 pack-year smoking history. He has never used smokeless tobacco. He reports current alcohol use. He reports that he does not use drugs.   Family History:  The patient's family history includes Colon cancer in his mother; Heart attack in his brother and mother; Leukemia in his father.  ROS:  Please see the history of present illness.  All other systems are reviewed and otherwise negative.   PHYSICAL EXAM:  VS:  There were no vitals taken for this visit. BMI: There is no height or weight on file to calculate BMI. Well nourished, well developed, in no acute distress  HEENT: normocephalic, atraumatic  Neck: no JVD, carotid bruits or masses Cardiac:  *** RRR; no significant murmurs, no rubs, or gallops Lungs: *** CTA b/l, no wheezing, rhonchi or rales  Abd: soft, non-tender, obese MS: no deformity or atrophy Ext: *** no edema  Skin: warm and dry, no rash Neuro:  No gross deficits appreciated Psych: euthymic mood, full affect  *** PPM site: stable, no tethering, skin change, edema or tenderness   EKG:  Done today and reviewed by myself ***     PPM interrogation done today and reviewed by myself:  ***   09/06/21: TTE  1. Left ventricular ejection fraction, by estimation, is 40 to 45%. The  left ventricle has mildly decreased function. The left ventricle  demonstrates global hypokinesis. There is mild concentric left ventricular  hypertrophy. Left ventricular diastolic  parameters are consistent with Grade I diastolic dysfunction (impaired  relaxation).   2. Right ventricular systolic function is mildly reduced. The right  ventricular size is mildly enlarged. There is normal pulmonary artery  systolic pressure. The estimated right ventricular systolic pressure is  99991111 mmHg.   3.  Left atrial size was mildly dilated.   4. Right atrial size was mildly dilated.   5. The mitral valve is normal in structure. Trivial mitral valve  regurgitation. No evidence of mitral stenosis.   6. The aortic valve has been repaired/replaced. There is a 29 mm Sapien  prosthetic (TAVR) valve present in the aortic position. Echo findings are  consistent with normal structure and function of the aortic valve  prosthesis. AoV mean gradient 7.91mHg,  Vmax 1.9568m, DI 0.44. There is trivial paravalvular leak.   7. Aortic dilatation noted. There is borderline dilatation of the  ascending aorta, measuring 37 mm.   8. The inferior vena cava is normal in size with greater than 50%  respiratory variability, suggesting right atrial pressure of 3 mmHg.   Comparison(s): Compared to prior TTE in 01/2020, there is no significant  change. Prior mean AoV gradient was 8.68m43m.    02/11/2020: TTE IMPRESSIONS   1. Left ventricular ejection fraction, by estimation, is 40 to 45%. The  left ventricle has mildly decreased function. The left ventricle  demonstrates global hypokinesis. Left ventricular diastolic parameters are  consistent with Grade I diastolic  dysfunction (impaired relaxation).   2. Right ventricular systolic function is mildly reduced. The right  ventricular size is normal.   3. The mitral valve is abnormal. Trivial mitral valve regurgitation.   4. The aortic valve has been repaired/replaced. Aortic valve  regurgitation is trivial. There is a 29 mm Sapien prosthetic (TAVR) valve  present in the aortic position. Echo findings are consistent with  perivalvular leak of the aortic prosthesis. Aortic  valve mean gradient measures 8.2 mmHg. Aortic valve Vmax measures 1.77  m/s. Peak gradient 12.5 mmHg. DI 0.41.   5. Aortic dilatation noted. There is borderline dilatation of the  ascending aorta, measuring 38 mm.   6. The inferior vena cava is normal in size with greater than 50%  respiratory  variability, suggesting right atrial pressure of 3 mmHg.    05/15/2019 TTE IMPRESSIONS 1. Left ventricular ejection fraction, by visual estimation, is 40 to 45%. The left ventricle has mild to moderately decreased function. There is mildly increased left ventricular hypertrophy.  2. Left ventricular diastolic parameters are consistent with Grade I diastolic dysfunction (impaired relaxation).  3. The left ventricle demonstrates global hypokinesis.  4. Global right ventricle has normal systolic function.The right ventricular size is mildly enlarged. No increase in right ventricular wall thickness.  5. Left atrial size was moderately dilated.  6. Right atrial size was moderately dilated.  7. The mitral valve is normal in structure. Mild mitral valve regurgitation. No evidence of mitral stenosis.  8. The tricuspid valve is normal in structure. Tricuspid valve regurgitation is mild.  9. Aortic valve regurgitation is not visualized. No evidence of aortic valve sclerosis or stenosis. 10. The pulmonic valve was normal in structure. Pulmonic valve regurgitation is not visualized. 11. Normal pulmonary artery systolic pressure. 12. The tricuspid regurgitant velocity is 1.98 m/s, and with an assumed right atrial pressure of 3 mmHg, the estimated right ventricular systolic pressure is normal at 18.7 mmHg. 13. A pacer wire is visualized in the RV and RA. 14. The inferior vena cava is normal in size with greater than 50% respiratory variability, suggesting right atrial pressure of 3 mmHg. 15. Normal transaortic gradients peak/mean 19/11 mmHg, trivial paravalvular leak. LVEF has decreased now 40-45% with diffuse hypokinesis.    03/21/2019: LHC/PCI Mid RCA lesion is 30% stenosed. Dist RCA-1 lesion is 90% stenosed. Dist RCA-2 lesion is 90% stenosed. SVG graft was not injected. Origin to Prox Graft lesion is 100% stenosed. Ramus lesion is 80% stenosed. Mid Cx lesion is 100% stenosed. SVG graft was  visualized by angiography. SVG graft was visualized by angiography. Mid LAD lesion is 100% stenosed. A drug-eluting stent was successfully placed using a STENT SYNERGY DES [id number]. Post intervention, there is a 40% residual stenosis. Post intervention, there is a 0% residual stenosis.   1. 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 Known occlusion SVG to PDA. Severe stenosis distal RCA.  3. Successful PTCA/DES x 1 distal RCA.  4. Chronic occlusion mid LAD. Patent LIMA to mid LAD. Patent SVG to Diagonal 5. Chronic occlusion mid Circumflex. Patent SVG to OM.  6. Severe disease in the small to moderate caliber intermediate branch 7. Severe aortic stenosis  By echo. Cath data with mean gradient 23.3 mmHg, peak to peak gradient 32 mm Hg).    Will continue planning for TAVR. Will continue ASA, Plavix and coumadin for now. Will continue DAPT with ASA and Plavix until after his TAVR procedure.     10/216/18, lexiscan stress myoview Nuclear stress EF: 42%. Ejection fraction on echocardiogram was 45%. There was no ST segment deviation noted during stress. This is a low risk study. No perfusion defects identified. Ejection fraction is mildly reduced, consistent with echocardiogram.  02/08/17: TTE Study Conclusions - Left ventricle: Abnormal septal motion mid and basal inferior   wall hypokinesis. Systolic function was mildly reduced. The   estimated ejection fraction was in the range of 45% to 50%.   Doppler parameters are consistent with abnormal left ventricular  relaxation (grade 1 diastolic dysfunction). - Aortic valve: Gradients actually appear lower than those recorded   on 02/07/16. There was moderate stenosis. Valve area (VTI): 0.74   cm^2. Valve area (Vmax): 0.7 cm^2. Valve area (Vmean): 0.74 cm^2. - Left atrium: The atrium was moderately dilated. - Atrial septum: No defect or patent foramen ovale was identified.  02/07/16: TTE, LVEF 40% 02/23/15: TTE, LVEF  35-40% 09/28/14: TTE, LVEF 25-30%, probable severe AS, dobutamine done described as moderate  07/07/14: LHC Impression: 1. 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 Moderately severe distal RCA stenosis, this vessel is no longer protected by the graft 3. Ischemic cardiomyopathy 4. Moderate aortic stenosis although severity may be underestimated given low LVEF Recommendations: He appears to remain volume overloaded. I would resume diuresis today. I cannot explain the drop in his LVEF by the occlusion of the graft to the PDA. His native RCA has moderately severe distal disease but good flow. This could be easily approached with PCI in the future. For now, would not recommend PCI of the RCA.   Recent Labs: 09/06/2021: ALT 22  07/11/2022: Chol/HDL Ratio 2.8; Cholesterol, Total 131; HDL 47; LDL Chol Calc (NIH) 53; Triglycerides 190   CrCl cannot be calculated (Patient's most recent lab result is older than the maximum 21 days allowed.).   Wt Readings from Last 3 Encounters:  07/25/22 235 lb 3.2 oz (106.7 kg)  03/02/22 236 lb 6.4 oz (107.2 kg)  08/19/21 238 lb 3.2 oz (108 kg)     Other studies reviewed: Additional studies/records reviewed today include: summarized above  ASSESSMENT AND PLAN:  1. PPM     *** Intact function     *** no programming changes made  2. CAD     *** No anginal complaints     *** On statin, BB, nitrate     C/w Dr.Turner  3. ICM, LVEF has improved over the years     *** Exam appears euvolemic, no symptoms of fluid OL     pt weighs daily, reports stable back on his usual lasix dosing     *** On BB, diuretic tx, no ACE/ARB, has hx of CRI, LVEF remains 45-50%  4. Paroxysmal Afib     CHA2DS2Vasc is at least 5, *** on warfarin     Monitored with the coumadin clinic     *** % burden  5. HTN     Looks OK, no changes  6. 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 VHD     S/p TAVR Nov 2020     Echo Sept  2021 looked OK                 Disposition:  ***     Current medicines are reviewed at length with the patient today.  The patient did not have any concerns regarding medicines.  Venetia Night, PA-C 08/06/2022 11:12 AM     [facility] [address] [place]  [address] [phone] (office)  [phone] (fax)

## 2022-08-07 ENCOUNTER — Ambulatory Visit: Payer: Medicare Other | Attending: Physician Assistant | Admitting: Physician Assistant

## 2022-08-07 ENCOUNTER — Encounter: Payer: Self-pay | Admitting: Physician Assistant

## 2022-08-07 VITALS — BP 126/68 | HR 86 | Ht 67.0 in | Wt 233.2 lb

## 2022-08-07 DIAGNOSIS — I4729 Other ventricular tachycardia: Secondary | ICD-10-CM | POA: Diagnosis not present

## 2022-08-07 DIAGNOSIS — I480 Paroxysmal atrial fibrillation: Secondary | ICD-10-CM

## 2022-08-07 DIAGNOSIS — Z79899 Other long term (current) drug therapy: Secondary | ICD-10-CM

## 2022-08-07 DIAGNOSIS — I2510 Atherosclerotic heart disease of native coronary artery without angina pectoris: Secondary | ICD-10-CM | POA: Diagnosis not present

## 2022-08-07 DIAGNOSIS — Z952 Presence of prosthetic heart valve: Secondary | ICD-10-CM | POA: Diagnosis not present

## 2022-08-07 DIAGNOSIS — Z950 Presence of cardiac pacemaker: Secondary | ICD-10-CM

## 2022-08-07 DIAGNOSIS — I10 Essential (primary) hypertension: Secondary | ICD-10-CM

## 2022-08-07 DIAGNOSIS — I255 Ischemic cardiomyopathy: Secondary | ICD-10-CM | POA: Diagnosis not present

## 2022-08-07 DIAGNOSIS — D6869 Other thrombophilia: Secondary | ICD-10-CM

## 2022-08-07 LAB — CUP PACEART INCLINIC DEVICE CHECK
Battery Impedance: 2380 Ohm
Battery Remaining Longevity: 31 mo
Battery Voltage: 2.74 V
Brady Statistic AP VP Percent: 0 %
Brady Statistic AP VS Percent: 81 %
Brady Statistic AS VP Percent: 0 %
Brady Statistic AS VS Percent: 18 %
Date Time Interrogation Session: 20240311180242
Implantable Lead Connection Status: 753985
Implantable Lead Connection Status: 753985
Implantable Lead Implant Date: 20020926
Implantable Lead Implant Date: 20020926
Implantable Lead Location: 753859
Implantable Lead Location: 753860
Implantable Lead Model: 5076
Implantable Lead Model: 5092
Implantable Pulse Generator Implant Date: 20120905
Lead Channel Impedance Value: 520 Ohm
Lead Channel Impedance Value: 889 Ohm
Lead Channel Pacing Threshold Amplitude: 0.5 V
Lead Channel Pacing Threshold Amplitude: 0.625 V
Lead Channel Pacing Threshold Amplitude: 1 V
Lead Channel Pacing Threshold Amplitude: 1.125 V
Lead Channel Pacing Threshold Pulse Width: 0.4 ms
Lead Channel Pacing Threshold Pulse Width: 0.4 ms
Lead Channel Pacing Threshold Pulse Width: 0.4 ms
Lead Channel Pacing Threshold Pulse Width: 0.4 ms
Lead Channel Sensing Intrinsic Amplitude: 1.4 mV
Lead Channel Sensing Intrinsic Amplitude: 11.2 mV
Lead Channel Setting Pacing Amplitude: 2 V
Lead Channel Setting Pacing Amplitude: 2.5 V
Lead Channel Setting Pacing Pulse Width: 0.4 ms
Lead Channel Setting Sensing Sensitivity: 4 mV
Zone Setting Status: 755011
Zone Setting Status: 755011

## 2022-08-07 NOTE — Patient Instructions (Addendum)
Medication Instructions:   Your physician recommends that you continue on your current medications as directed. Please refer to the Current Medication list given to you today.  *If you need a refill on your cardiac medications before your next appointment, please call your pharmacy*   Lab Work:   [facility] [place]   If you have labs (blood work) drawn today and your tests are completely normal, you will receive your results only by: [facility] (if you have MyChart) OR A paper copy in the mail If you have any lab test that is abnormal or we need to change your treatment, we will call you to review the results.   Testing/Procedures: NONE ORDERED  TODAY     Follow-Up: At [facility], you and your health needs are our priority.  As part of our continuing mission to provide you with exceptional heart care, we have created designated Provider Care Teams.  These Care Teams include your primary Cardiologist (physician) and Advanced Practice Providers (APPs -  Physician Assistants and Nurse Practitioners) who all work together to provide you with the care you need, when you need it.  We recommend signing up for the patient portal called "MyChart".  Sign up information is provided on this After Visit Summary.  MyChart is used to connect with patients for Virtual Visits (Telemedicine).  Patients are able to view lab/test results, encounter notes, upcoming appointments, etc.  Non-urgent messages can be sent to your provider as well.   To learn more about what you can do with MyChart, go to [email or web address].    Your next appointment:   1 year(s)  Provider:   Virl Axe, MD    Other Instructions

## 2022-08-08 LAB — CBC
Hematocrit: 43.2 % (ref 37.5–51.0)
Hemoglobin: 15 g/dL (ref 13.0–17.7)
MCH: 30.1 pg (ref 26.6–33.0)
MCHC: 34.7 g/dL (ref 31.5–35.7)
MCV: 87 fL (ref 79–97)
Platelets: 153 10*3/uL (ref 150–450)
RBC: 4.99 x10E6/uL (ref 4.14–5.80)
RDW: 13.6 % (ref 11.6–15.4)
WBC: 5.4 10*3/uL (ref 3.4–10.8)

## 2022-08-08 LAB — BASIC METABOLIC PANEL
BUN/Creatinine Ratio: 19 (ref 10–24)
BUN: 22 mg/dL (ref 8–27)
CO2: 21 mmol/L (ref 20–29)
Calcium: 8.8 mg/dL (ref 8.6–10.2)
Chloride: 103 mmol/L (ref 96–106)
Creatinine, Ser: 1.13 mg/dL (ref 0.76–1.27)
Glucose: 103 mg/dL — ABNORMAL HIGH (ref 70–99)
Potassium: 4.4 mmol/L (ref 3.5–5.2)
Sodium: 138 mmol/L (ref 134–144)
eGFR: 65 mL/min/{1.73_m2} (ref >59)

## 2022-08-08 LAB — MAGNESIUM: Magnesium: 2.1 mg/dL (ref 1.6–2.3)

## 2022-08-15 ENCOUNTER — Ambulatory Visit: Payer: Medicare Other | Attending: Cardiovascular Disease | Admitting: *Deleted

## 2022-08-15 DIAGNOSIS — Z5181 Encounter for therapeutic drug level monitoring: Secondary | ICD-10-CM

## 2022-08-15 DIAGNOSIS — I480 Paroxysmal atrial fibrillation: Secondary | ICD-10-CM | POA: Diagnosis not present

## 2022-08-15 LAB — POCT INR: INR: 2.5 (ref 2.0–3.0)

## 2022-08-15 NOTE — Patient Instructions (Signed)
Description   Continue taking Warfarin 1 tablet everyday except 1.5 tablets on Mondays, Wednesdays, and Fridays. Recheck INR in 6 weeks. Coumadin clinic [phone].

## 2022-08-31 ENCOUNTER — Other Ambulatory Visit: Payer: Self-pay | Admitting: Cardiology

## 2022-09-06 NOTE — Progress Notes (Signed)
Remote pacemaker transmission.   

## 2022-09-15 ENCOUNTER — Other Ambulatory Visit: Payer: Self-pay | Admitting: Cardiology

## 2022-09-26 ENCOUNTER — Ambulatory Visit: Payer: Medicare Other | Attending: Cardiovascular Disease | Admitting: *Deleted

## 2022-09-26 DIAGNOSIS — Z5181 Encounter for therapeutic drug level monitoring: Secondary | ICD-10-CM | POA: Diagnosis not present

## 2022-09-26 DIAGNOSIS — I480 Paroxysmal atrial fibrillation: Secondary | ICD-10-CM

## 2022-09-26 LAB — POCT INR: INR: 2.3 (ref 2.0–3.0)

## 2022-09-26 NOTE — Patient Instructions (Addendum)
Description   Continue taking Warfarin 1 tablet everyday except 1.5 tablets on Mondays, Wednesdays, and Fridays. Recheck INR in 6 weeks. Coumadin clinic [phone] or [phone].

## 2022-09-28 ENCOUNTER — Other Ambulatory Visit: Payer: Self-pay | Admitting: Physician Assistant

## 2022-09-29 ENCOUNTER — Other Ambulatory Visit: Payer: Self-pay

## 2022-09-29 MED ORDER — NITROGLYCERIN 0.4 MG SL SUBL: 0.4000 mg | SUBLINGUAL_TABLET | SUBLINGUAL | 1 refills | Status: AC | PRN

## 2022-09-29 NOTE — Telephone Encounter (Signed)
Pt's medication was sent to pt's pharmacy as requested. Confirmation received.  °

## 2022-10-10 DIAGNOSIS — E1122 Type 2 diabetes mellitus with diabetic chronic kidney disease: Secondary | ICD-10-CM | POA: Diagnosis not present

## 2022-10-10 DIAGNOSIS — N1832 Chronic kidney disease, stage 3b: Secondary | ICD-10-CM | POA: Diagnosis not present

## 2022-10-10 DIAGNOSIS — I5042 Chronic combined systolic (congestive) and diastolic (congestive) heart failure: Secondary | ICD-10-CM | POA: Diagnosis not present

## 2022-10-10 DIAGNOSIS — Z9989 Dependence on other enabling machines and devices: Secondary | ICD-10-CM | POA: Diagnosis not present

## 2022-10-10 DIAGNOSIS — E1159 Type 2 diabetes mellitus with other circulatory complications: Secondary | ICD-10-CM | POA: Diagnosis not present

## 2022-10-10 DIAGNOSIS — I480 Paroxysmal atrial fibrillation: Secondary | ICD-10-CM | POA: Diagnosis not present

## 2022-10-10 DIAGNOSIS — I25119 Atherosclerotic heart disease of native coronary artery with unspecified angina pectoris: Secondary | ICD-10-CM | POA: Diagnosis not present

## 2022-10-10 DIAGNOSIS — D6869 Other thrombophilia: Secondary | ICD-10-CM | POA: Diagnosis not present

## 2022-10-13 ENCOUNTER — Other Ambulatory Visit: Payer: Self-pay | Admitting: Family Medicine

## 2022-10-13 ENCOUNTER — Encounter: Payer: Self-pay | Admitting: Family Medicine

## 2022-10-13 DIAGNOSIS — I6521 Occlusion and stenosis of right carotid artery: Secondary | ICD-10-CM

## 2022-10-28 ENCOUNTER — Other Ambulatory Visit: Payer: Self-pay | Admitting: Cardiology

## 2022-10-28 DIAGNOSIS — I480 Paroxysmal atrial fibrillation: Secondary | ICD-10-CM

## 2022-10-30 NOTE — Telephone Encounter (Signed)
Prescription refill request received for warfarin Lov: Brandon Klein, 08/07/2022 Next INR check: 6/11 Warfarin tablet strength: 5mg    Refill sent.

## 2022-10-31 ENCOUNTER — Other Ambulatory Visit: Payer: Self-pay | Admitting: Cardiology

## 2022-10-31 ENCOUNTER — Ambulatory Visit (INDEPENDENT_AMBULATORY_CARE_PROVIDER_SITE_OTHER): Payer: Medicare Other

## 2022-10-31 DIAGNOSIS — I480 Paroxysmal atrial fibrillation: Secondary | ICD-10-CM

## 2022-10-31 LAB — CUP PACEART REMOTE DEVICE CHECK
Battery Impedance: 2350 Ohm
Battery Remaining Longevity: 32 mo
Battery Voltage: 2.74 V
Brady Statistic AP VP Percent: 0 %
Brady Statistic AP VS Percent: 86 %
Brady Statistic AS VP Percent: 0 %
Brady Statistic AS VS Percent: 13 %
Date Time Interrogation Session: 20240604153740
Implantable Lead Connection Status: 753985
Implantable Lead Connection Status: 753985
Implantable Lead Implant Date: 20020926
Implantable Lead Implant Date: 20020926
Implantable Lead Location: 753859
Implantable Lead Location: 753860
Implantable Lead Model: 5076
Implantable Lead Model: 5092
Implantable Pulse Generator Implant Date: 20120905
Lead Channel Impedance Value: 525 Ohm
Lead Channel Impedance Value: 983 Ohm
Lead Channel Pacing Threshold Amplitude: 0.625 V
Lead Channel Pacing Threshold Amplitude: 1.125 V
Lead Channel Pacing Threshold Pulse Width: 0.4 ms
Lead Channel Pacing Threshold Pulse Width: 0.4 ms
Lead Channel Setting Pacing Amplitude: 2 V
Lead Channel Setting Pacing Amplitude: 2.5 V
Lead Channel Setting Pacing Pulse Width: 0.4 ms
Lead Channel Setting Sensing Sensitivity: 4 mV
Zone Setting Status: 755011
Zone Setting Status: 755011

## 2022-10-31 MED ORDER — ISOSORBIDE MONONITRATE ER 30 MG PO TB24: ORAL_TABLET | ORAL | 3 refills | Status: DC

## 2022-11-03 ENCOUNTER — Encounter: Payer: Self-pay | Admitting: Cardiology

## 2022-11-03 ENCOUNTER — Ambulatory Visit: Payer: Medicare Other | Attending: Cardiology | Admitting: Cardiology

## 2022-11-03 VITALS — BP 102/60 | HR 72 | Ht 67.0 in | Wt 232.8 lb

## 2022-11-03 DIAGNOSIS — I2510 Atherosclerotic heart disease of native coronary artery without angina pectoris: Secondary | ICD-10-CM

## 2022-11-03 DIAGNOSIS — I255 Ischemic cardiomyopathy: Secondary | ICD-10-CM | POA: Diagnosis not present

## 2022-11-03 DIAGNOSIS — I350 Nonrheumatic aortic (valve) stenosis: Secondary | ICD-10-CM | POA: Diagnosis not present

## 2022-11-03 DIAGNOSIS — E785 Hyperlipidemia, unspecified: Secondary | ICD-10-CM | POA: Diagnosis not present

## 2022-11-03 DIAGNOSIS — I480 Paroxysmal atrial fibrillation: Secondary | ICD-10-CM

## 2022-11-03 DIAGNOSIS — I5042 Chronic combined systolic (congestive) and diastolic (congestive) heart failure: Secondary | ICD-10-CM

## 2022-11-03 DIAGNOSIS — I10 Essential (primary) hypertension: Secondary | ICD-10-CM | POA: Diagnosis not present

## 2022-11-03 NOTE — Patient Instructions (Signed)
Medication Instructions:  Your physician recommends that you continue on your current medications as directed. Please refer to the Current Medication list given to you today.  *If you need a refill on your cardiac medications before your next appointment, please call your pharmacy*   Lab Work: None.  If you have labs (blood work) drawn today and your tests are completely normal, you will receive your results only by: MyChart Message (if you have MyChart) OR A paper copy in the mail If you have any lab test that is abnormal or we need to change your treatment, we will call you to review the results.   Testing/Procedures: None.   Follow-Up: At [facility], you and your health needs are our priority.  As part of our continuing mission to provide you with exceptional heart care, we have created designated Provider Care Teams.  These Care Teams include your primary Cardiologist (physician) and Advanced Practice Providers (APPs -  Physician Assistants and Nurse Practitioners) who all work together to provide you with the care you need, when you need it.  We recommend signing up for the patient portal called "MyChart".  Sign up information is provided on this After Visit Summary.  MyChart is used to connect with patients for Virtual Visits (Telemedicine).  Patients are able to view lab/test results, encounter notes, upcoming appointments, etc.  Non-urgent messages can be sent to your provider as well.   To learn more about what you can do with MyChart, go to [email or web address].    Your next appointment:   1 year(s)  Provider:   Traci Turner, MD     

## 2022-11-03 NOTE — Progress Notes (Signed)
Cardiology Office Note:    Date:  11/03/2022   ID:  Brandon Klein, DOB 01-09-1941, MRN [id number]  PCP:  Sigmund Hazel, MD  Cardiologist:  Armanda Magic, MD    Referring MD: Sigmund Hazel, MD   Chief Complaint  Patient presents with   Aortic Stenosis   Coronary Artery Disease   Atrial Fibrillation   Hypertension   Hyperlipidemia    History of Present Illness:    Brandon Klein is a 82 y.o. male with a hx of carotid artery disease, CAD s/p 4V CABG (2015 PVT) s/p PCI to dRCA (10/23), chronic systolic CHF, NICM, HTN, HLD, borderline DM on no therapy, PAF on coumadin, CKD stage II, sleep apnea, bradycardia s/p PPM and severe AS s/p TAVR (04/08/19).   He has a hx of CAD and underwent 4V CABG in 2015. He has had a pacemaker implanted remotely for bradycardia and syncope. He has paroxysmal atrial fibrillation and is on coumadin. His aortic stenosis has been followed for several years. Echo 01/31/19 showed LVEF=45-50% with severe AS.  Cardiac cath 03/21/19 with 3/4 patent bypass grafts. The SVG to the PDA is known to be occluded. A drug eluting stent was placed in the distal RCA.    He underwent successful TAVR with a 29 mm Edwards Sapien 3 THV via the TF approach on 04/08/19. Post operative echo showed EF 50-55% with normally functioning TAVR with a mean gradient of 12 mm Hg and no PVL. He has done well in follow up.  He is here today for followup and is doing well.  He denies any chest pain or pressure, SOB, DOE, PND, orthopnea, LE edema, dizziness, palpitations or syncope. He is compliant with his meds and is tolerating meds with no SE.    Past Medical History:  Diagnosis Date   Chronic renal insufficiency    Coronary artery disease    a. s/p PCI of LAD 1997. b. Cutting balloon angioplasty to LCx in 2002. c. s/p cath 11/2013 with severe 3 vessel ASCAD s/p CABG with LIMA to LAD, SVG to diag, SVG to left circ and SVG to PDA. d. cath 07/07/2014 occluded SVG to PDA, all other grafts patent. EF down for  likely NICM   DJD (degenerative joint disease)    Epistaxis    GERD (gastroesophageal reflux disease)    GI bleeding    a.  with benign gastric polypectomy 05/2014.   HLD (hyperlipidemia)    HTN (hypertension)    Obesity    OSA (obstructive sleep apnea)    Pacemaker    a. 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 b. MDT generator replacement 2012.   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 vasovagal with documented bradycardia.   Type 2 diabetes mellitus without complications (HCC) 12/02/2015    Past Surgical History:  Procedure Laterality Date   ANGIOPLASTY  1997   CHOLECYSTECTOMY     COLONOSCOPY WITH PROPOFOL N/A 05/05/2014   Procedure: COLONOSCOPY WITH PROPOFOL;  Surgeon: Charolett Bumpers, MD;  Location: [facility] ENDOSCOPY;  Service: Endoscopy;  Laterality: N/A;   CORONARY  ARTERY BYPASS GRAFT N/A 12/01/2013   Procedure: CORONARY ARTERY BYPASS GRAFT TIMES FOUR USING LEFT INTERNAL MAMMARY ARTERY TO LAD, SAPHENOUS VEIN GRAFTS TO DIAGONAL, CIRCUMFELX AND POSTERIOR DESCENDING;  Surgeon: Kerin Perna, MD;  Location: [facility] OR;  Service: Open Heart Surgery;  Laterality: N/A;   CORONARY STENT INTERVENTION N/A 03/21/2019   Procedure: CORONARY STENT INTERVENTION;  Surgeon: Kathleene Hazel, MD;  Location: [facility] INVASIVE CV LAB;  Service: Cardiovascular;  Laterality: N/A;  Distal RCA   ESOPHAGOGASTRODUODENOSCOPY (EGD) WITH PROPOFOL N/A 05/05/2014   Procedure: ESOPHAGOGASTRODUODENOSCOPY (EGD) WITH PROPOFOL;  Surgeon: Charolett Bumpers, MD;  Location: [facility] END[facility];  Service: Endoscopy;  Laterality: N/A;   KNEE ARTHROSCOPY  2000   Left   LEFT AND RIGHT  HEART CATHETERIZATION WITH CORONARY/GRAFT ANGIOGRAM N/A 07/07/2014   Procedure: LEFT AND RIGHT HEART CATHETERIZATION WITH Isabel Caprice;  Surgeon: Kathleene Hazel, MD;  Location: [facility] CATH LAB;  Service: Cardiovascular;  Laterality: N/A;   LEFT HEART CATHETERIZATION WITH CORONARY ANGIOGRAM N/A 11/25/2013   Procedure: LEFT HEART CATHETERIZATION WITH CORONARY ANGIOGRAM;  Surgeon: [name], MD;  Location: [facility] CATH LAB;  Service: Cardiovascular;  Laterality: N/A;   PACEMAKER INSERTION  2002   generator change MDT by Dr Graciela Husbands in 2012   RIGHT/LEFT HEART CATH AND CORONARY/GRAFT ANGIOGRAPHY N/A 03/21/2019   Procedure: RIGHT/LEFT HEART CATH AND CORONARY/GRAFT ANGIOGRAPHY;  Surgeon: Kathleene Hazel, MD;  Location: [facility] INVASIVE CV LAB;  Service: Cardiovascular;  Laterality: N/A;   TEE WITHOUT CARDIOVERSION N/A 08/31/2014   Procedure: TRANSESOPHAGEAL ECHOCARDIOGRAM (TEE);  Surgeon: [name], MD;  Location: [facility] ENDOSCOPY;  Service: Cardiovascular;  Laterality: N/A;   TEE WITHOUT CARDIOVERSION N/A 04/08/2019   Procedure: TRANSESOPHAGEAL ECHOCARDIOGRAM (TEE);  Surgeon: Kathleene Hazel, MD;  Location: [facility] OR;  Service: Open Heart Surgery;  Laterality: N/A;   TRANSCATHETER AORTIC VALVE REPLACEMENT, TRANSFEMORAL  04/08/2019   TRANSCATHETER AORTIC VALVE REPLACEMENT, TRANSFEMORAL N/A 04/08/2019   Procedure: TRANSCATHETER AORTIC VALVE REPLACEMENT, TRANSFEMORAL;  Surgeon: Kathleene Hazel, MD;  Location: [facility] OR;  Service: Open Heart Surgery;  Laterality: N/A;    Current Medications: Current Meds  Medication Sig   Acetaminophen (TYLENOL ARTHRITIS PAIN PO) Take 1,300 mg by mouth 3 (three) times daily as needed.   aspirin 81 MG EC tablet Take 81 mg by mouth daily. Swallow whole.   atorvastatin (LIPITOR) 40 MG tablet Take 1 tablet (40 mg total) by mouth daily.   azithromycin (ZITHROMAX) 500 MG tablet TAKE 1 TABLET BY MOUTH 1 HR BEFORE ANY DENTAL WORK INCLUDING CLEANINGS.    carvedilol (COREG) 25 MG tablet TAKE 1 TABLET(25 MG) BY MOUTH TWICE DAILY WITH A MEAL   colchicine 0.6 MG tablet as directed. Take 1 tablet by mouth twice daily and 2 tablets at onset of Gout   docusate sodium (COLACE) 100 MG capsule Take 200 mg by mouth daily as needed for mild constipation.    ezetimibe (ZETIA) 10 MG tablet Take 1 tablet (10 mg total) by mouth daily.   fexofenadine (ALLEGRA) 180 MG tablet Take 180 mg by mouth daily.    fluticasone (FLONASE) 50 MCG/ACT nasal spray Place 2 sprays into the nose daily.    furosemide (LASIX) 20 MG tablet TAKE 1 TABLET BY MOUTH TWICE DAILY ALONG WITH 80 MG TABLET   furosemide (LASIX) 80 MG tablet TAKE 1 TABLET BY MOUTH TWICE DAILY WITH 20 MG TABLET   Glucosamine-Chondroitin 500-400 MG CAPS Take 1 tablet by mouth daily.   hydrALAZINE (APRESOLINE) 25 MG tablet TAKE 1  TABLET(25 MG) BY MOUTH THREE TIMES DAILY   isosorbide mononitrate (IMDUR) 30 MG 24 hr tablet TAKE 1 TABLET(30 MG) BY MOUTH DAILY   L-Lysine 1000 MG TABS Take 1,000 mg by mouth daily.    meclizine (ANTIVERT) 25 MG tablet Take 25 mg by mouth daily as needed for dizziness.   Multiple Vitamin (MULTIVITAMIN) tablet Take 1 tablet by mouth daily. Centrum silver   nitroGLYCERIN (NITROSTAT) 0.4 MG SL tablet Place 1 tablet (0.4 mg total) under the tongue every 5 (five) minutes as needed for chest pain.   ONE TOUCH ULTRA TEST test strip 1 each by Other route every morning.    ONETOUCH DELICA LANCETS FINE MISC 1 each by Other route every morning.    pantoprazole (PROTONIX) 40 MG tablet Take 40 mg by mouth daily.    potassium chloride SA (KLOR-CON M) 20 MEQ tablet TAKE 2 TABLETS(40 MEQ) BY MOUTH TWICE DAILY   RA NATURAL MAGNESIUM 250 MG TABS TAKE 2 TABLETS TWICE DAILY.   spironolactone (ALDACTONE) 25 MG tablet TAKE 1 TABLET(25 MG) BY MOUTH DAILY   warfarin (COUMADIN) 5 MG tablet TAKE 1 TO 1 AND 1/2 TABLETS BY MOUTH DAILY AS DIRECTED BY COUMADIN CLINIC     Allergies:   Vascepa [icosapent ethyl],  Acetaminophen-codeine, Amoxicillin, Codeine, and Diovan [valsartan]   Social History   Socioeconomic History   Marital status: Married    Spouse name: Not on file   Number of children: Not on file   Years of education: Not on file   Highest education level: Not on file  Occupational History   Not on file  Tobacco Use   Smoking status: Former    Packs/day: 2.00    Years: 5.00    Additional pack years: 0.00    Total pack years: 10.00    Types: Cigarettes    Quit date: 05/29/1966    Years since quitting: 56.4   Smokeless tobacco: Never  Vaping Use   Vaping Use: Never used  Substance and Sexual Activity   Alcohol use: Yes    Alcohol/week: 0.0 standard drinks of alcohol    Comment: ocassional   Drug use: No   Sexual activity: Not on file  Other Topics Concern   Not on file  Social History Narrative   Lives in [place] with spouse.  Retired Theatre manager of [employer] Strain: Not on [employer] Insecurity: Not on file  Transportation Needs: Not on file  Physical Activity: Not on file  Stress: Not on file  Social Connections: Not on file     Family History: The patient's family history includes Colon cancer in his mother; Heart attack in his brother and mother; Leukemia in his father.  ROS:   Please see the history of present illness.    ROS  All other systems reviewed and negative.   EKGs/Labs/Other Studies Reviewed:    The following studies were reviewed today: 2D echo 02/11/2020 IMPRESSIONS    1. Left ventricular ejection fraction, by estimation, is 40 to 45%. The  left ventricle has mildly decreased function. The left ventricle  demonstrates global hypokinesis. Left ventricular diastolic parameters are  consistent with Grade I diastolic  dysfunction (impaired relaxation).   2. Right ventricular systolic function is mildly reduced. The right  ventricular size is normal.   3. The mitral valve is abnormal. Trivial mitral  valve regurgitation.   4. The aortic valve has been repaired/replaced. Aortic valve  regurgitation is trivial. There is  a 29 mm Sapien prosthetic (TAVR) valve  present in the aortic position. Echo findings are consistent with  perivalvular leak of the aortic prosthesis. Aortic  valve mean gradient measures 8.2 mmHg. Aortic valve Vmax measures 1.77  m/s. Peak gradient 12.5 mmHg. DI 0.41.   5. Aortic dilatation noted. There is borderline dilatation of the  ascending aorta, measuring 38 mm.   6. The inferior vena cava is normal in size with greater than 50%  respiratory variability, suggesting right atrial pressure of 3 mmHg.   EKG:  EKG is not ordered today.    Recent Labs: 08/07/2022: BUN 22; Creatinine, Ser 1.13; Hemoglobin 15.0; Magnesium 2.1; Platelets 153; Potassium 4.4; Sodium 138   Recent Lipid Panel    Component Value Date/Time   CHOL 131 07/11/2022 1118   CHOL 113 07/22/2014 1226   TRIG 190 (H) 07/11/2022 1118   TRIG 91 07/22/2014 1226   HDL 47 07/11/2022 1118   HDL 40 07/22/2014 1226   CHOLHDL 2.8 07/11/2022 1118   CHOLHDL 3.4 06/04/2015 1216   VLDL 39 (H) 06/04/2015 1216   LDLCALC 53 07/11/2022 1118   LDLCALC 55 07/22/2014 1226   LDLDIRECT 47.9 07/14/2013 0937    Physical Exam:    VS:  BP 102/60   Pulse 72   Ht 5\' 7"  (1.702 m)   Wt 232 lb 12.8 oz (105.6 kg)   SpO2 95%   BMI 36.46 kg/m     Wt Readings from Last 3 Encounters:  11/03/22 232 lb 12.8 oz (105.6 kg)  08/07/22 233 lb 3.2 oz (105.8 kg)  07/25/22 235 lb 3.2 oz (106.7 kg)    GEN: Well nourished, well developed in no acute distress HEENT: Normal NECK: No JVD; No carotid bruits LYMPHATICS: No lymphadenopathy CARDIAC:RRR, no murmurs, rubs, gallops RESPIRATORY:  Clear to auscultation without rales, wheezing or rhonchi  ABDOMEN: Soft, non-tender, non-distended MUSCULOSKELETAL:  No edema; No deformity  SKIN: Warm and dry NEUROLOGIC:  Alert and oriented x 3 PSYCHIATRIC:  Normal affect  ASSESSMENT:     1. Nonrheumatic aortic valve stenosis   2. Coronary artery disease involving native coronary artery of native heart without angina pectoris   3. Paroxysmal atrial fibrillation (HCC)   4. Ischemic cardiomyopathy   5. Chronic combined systolic and diastolic CHF (congestive heart failure) (HCC)   6. Primary hypertension   7. Dyslipidemia    PLAN:    In order of problems listed above:  1.  Severe AS -29 mm Edwards Sapien 3 THV via the TF approach on 04/08/19.  -followed in Structural heart clinic -2D echo 01/2020 with mean AVG 8.77mmHg, DI 0.41>>this is stable -He is following SBE prophylaxis for dental procedures -Repeat 2D echo 09/15/2021 showed EF 40 to 45% with stable TAVR with mean aortic valve gradient 7.2 mmHg and trivial perivalvular leak  2.  ASCAD -s/p 4V CABG 2015  -Cardiac cath 03/21/19 with 3/4 patent bypass grafts. The SVG to the PDA is known to be occluded. A drug eluting stent was placed in the distal RCA. -He has not had any general symptoms since I saw him last -Continue prescription drug management with aspirin 81 mg daily, carvedilol 25 mg twice daily, atorvastatin 40 mg daily and Imdur 30 mg daily with as needed refills  3.  PAF -He is maintaining normal sinus rhythm on exam and denies any palpitations -He has not had any bleeding issues on warfarin -Continue drug management with carvedilol 25 mg twice daily and warfarin therapy followed in Coumadin clinic  4.  Chronic combined S/D CHF -He does not appear volume overloaded on exam today -His weight is stable and he has not had any problems with shortness of breath or lower extremity edema -Continue prescription drug management with Lasix 100 mg twice daily, carvedilol 25 mg twice daily, hydralazine 25 mg 3 times daily, Imdur 30 mg daily and spironolactone 25 mg daily with as needed refills -I have personally reviewed and interpreted outside labs performed by patient's PCP which showed serum creatinine 1.13 and  potassium 4.4 on 08/07/2022 -2D echo 08/2021 showed stable EF at 40 to 45% unchanged from 2021  5.  HTN -BP is adequately controlled on exam today -Continue prescription drug management with carvedilol 25 mg twice daily, Imdur 30 mg daily, Hydralazine 25mg  TID, spironolactone 25 mg daily with as needed refills  6.  HLD -LDL goal < 70 -I have personally reviewed and interpreted outside labs performed by patient's PCP which showed LDL 53 and HDL 48 on 07/11/2022 and ALT normal at 36 on 04/14/2022 -Continue prescription drug with atorvastatin 40 mg daily and Zetia 10 mg daily with as needed refills  Medication Adjustments/Labs and Tests Ordered: Current medicines are reviewed at length with the patient today.  Concerns regarding medicines are outlined above.  No orders of the defined types were placed in this encounter.  No orders of the defined types were placed in this encounter.  Followup with me in 1 year  Signed, Armanda Magic, MD  11/03/2022 1:21 PM    [facility]

## 2022-11-07 ENCOUNTER — Ambulatory Visit: Payer: Medicare Other | Attending: Internal Medicine

## 2022-11-07 DIAGNOSIS — I480 Paroxysmal atrial fibrillation: Secondary | ICD-10-CM | POA: Diagnosis not present

## 2022-11-07 DIAGNOSIS — Z5181 Encounter for therapeutic drug level monitoring: Secondary | ICD-10-CM

## 2022-11-07 LAB — POCT INR: INR: 2.4 (ref 2.0–3.0)

## 2022-11-07 NOTE — Patient Instructions (Signed)
Continue taking Warfarin 1 tablet everyday except 1.5 tablets on Mondays, Wednesdays, and Fridays. Recheck INR in 6 weeks. Coumadin clinic [phone] or [phone].

## 2022-11-17 ENCOUNTER — Ambulatory Visit
Admission: RE | Admit: 2022-11-17 | Discharge: 2022-11-17 | Disposition: A | Discharge status: Home / Self Care | Payer: Medicare Other | Source: Ambulatory Visit | Attending: Family Medicine | Admitting: Family Medicine

## 2022-11-17 DIAGNOSIS — I6523 Occlusion and stenosis of bilateral carotid arteries: Secondary | ICD-10-CM | POA: Diagnosis not present

## 2022-11-17 DIAGNOSIS — I6521 Occlusion and stenosis of right carotid artery: Secondary | ICD-10-CM

## 2022-11-23 NOTE — Progress Notes (Signed)
Remote pacemaker transmission.   

## 2022-12-12 ENCOUNTER — Other Ambulatory Visit: Payer: Self-pay | Admitting: Cardiology

## 2022-12-19 ENCOUNTER — Ambulatory Visit: Payer: Medicare Other | Attending: Cardiovascular Disease

## 2022-12-19 DIAGNOSIS — Z5181 Encounter for therapeutic drug level monitoring: Secondary | ICD-10-CM

## 2022-12-19 DIAGNOSIS — I480 Paroxysmal atrial fibrillation: Secondary | ICD-10-CM

## 2022-12-19 LAB — POCT INR: INR: 2 (ref 2.0–3.0)

## 2022-12-19 NOTE — Patient Instructions (Signed)
Continue taking Warfarin 1 tablet everyday except 1.5 tablets on Mondays, Wednesdays, and Fridays. Recheck INR in 6 weeks. Coumadin clinic [phone] or [phone].

## 2023-01-19 ENCOUNTER — Other Ambulatory Visit: Payer: Self-pay | Admitting: Cardiology

## 2023-01-19 DIAGNOSIS — I480 Paroxysmal atrial fibrillation: Secondary | ICD-10-CM

## 2023-01-30 ENCOUNTER — Ambulatory Visit (INDEPENDENT_AMBULATORY_CARE_PROVIDER_SITE_OTHER): Payer: Medicare Other

## 2023-01-30 ENCOUNTER — Ambulatory Visit: Payer: Medicare Other | Attending: Cardiology

## 2023-01-30 DIAGNOSIS — Z5181 Encounter for therapeutic drug level monitoring: Secondary | ICD-10-CM

## 2023-01-30 DIAGNOSIS — I480 Paroxysmal atrial fibrillation: Secondary | ICD-10-CM

## 2023-01-30 DIAGNOSIS — I5042 Chronic combined systolic (congestive) and diastolic (congestive) heart failure: Secondary | ICD-10-CM

## 2023-01-30 LAB — CUP PACEART REMOTE DEVICE CHECK
Battery Impedance: 2353 Ohm
Battery Remaining Longevity: 32 mo
Battery Voltage: 2.73 V
Brady Statistic AP VP Percent: 0 %
Brady Statistic AP VS Percent: 87 %
Brady Statistic AS VP Percent: 0 %
Brady Statistic AS VS Percent: 13 %
Date Time Interrogation Session: 20240903114330
Implantable Lead Connection Status: 753985
Implantable Lead Connection Status: 753985
Implantable Lead Implant Date: 20020926
Implantable Lead Implant Date: 20020926
Implantable Lead Location: 753859
Implantable Lead Location: 753860
Implantable Lead Model: 5076
Implantable Lead Model: 5092
Implantable Pulse Generator Implant Date: 20120905
Lead Channel Impedance Value: 1012 Ohm
Lead Channel Impedance Value: 514 Ohm
Lead Channel Pacing Threshold Amplitude: 0.625 V
Lead Channel Pacing Threshold Amplitude: 1.125 V
Lead Channel Pacing Threshold Pulse Width: 0.4 ms
Lead Channel Pacing Threshold Pulse Width: 0.4 ms
Lead Channel Setting Pacing Amplitude: 2 V
Lead Channel Setting Pacing Amplitude: 2.5 V
Lead Channel Setting Pacing Pulse Width: 0.4 ms
Lead Channel Setting Sensing Sensitivity: 4 mV
Zone Setting Status: 755011
Zone Setting Status: 755011

## 2023-01-30 LAB — POCT INR: INR: 2.2 (ref 2.0–3.0)

## 2023-01-30 NOTE — Patient Instructions (Signed)
Continue taking Warfarin 1 tablet everyday except 1.5 tablets on Mondays, Wednesdays, and Fridays. Recheck INR in 6 weeks. Coumadin clinic [phone] or [phone].

## 2023-02-07 NOTE — Progress Notes (Signed)
Remote pacemaker transmission.   

## 2023-02-09 ENCOUNTER — Other Ambulatory Visit: Payer: Self-pay | Admitting: Cardiology

## 2023-02-09 ENCOUNTER — Other Ambulatory Visit (HOSPITAL_BASED_OUTPATIENT_CLINIC_OR_DEPARTMENT_OTHER): Payer: Self-pay | Admitting: Internal Medicine

## 2023-02-13 ENCOUNTER — Other Ambulatory Visit (HOSPITAL_BASED_OUTPATIENT_CLINIC_OR_DEPARTMENT_OTHER): Payer: Self-pay | Admitting: Internal Medicine

## 2023-03-13 ENCOUNTER — Ambulatory Visit: Payer: Medicare Other | Attending: Cardiology | Admitting: *Deleted

## 2023-03-13 DIAGNOSIS — Z5181 Encounter for therapeutic drug level monitoring: Secondary | ICD-10-CM

## 2023-03-13 DIAGNOSIS — I480 Paroxysmal atrial fibrillation: Secondary | ICD-10-CM | POA: Diagnosis not present

## 2023-03-13 LAB — POCT INR: INR: 1.8 — AB (ref 2.0–3.0)

## 2023-03-13 NOTE — Patient Instructions (Addendum)
Description   Today take 1.5 tablets then continue taking Warfarin 1 tablet everyday except 1.5 tablets on Mondays, Wednesdays, and Fridays. Recheck INR in 4 weeks. Coumadin clinic [phone] or [phone].

## 2023-03-16 ENCOUNTER — Other Ambulatory Visit: Payer: Self-pay | Admitting: Cardiology

## 2023-04-07 ENCOUNTER — Other Ambulatory Visit: Payer: Self-pay | Admitting: Cardiology

## 2023-04-07 DIAGNOSIS — I480 Paroxysmal atrial fibrillation: Secondary | ICD-10-CM

## 2023-04-10 ENCOUNTER — Ambulatory Visit: Payer: Medicare Other | Attending: Cardiology

## 2023-04-10 DIAGNOSIS — Z5181 Encounter for therapeutic drug level monitoring: Secondary | ICD-10-CM

## 2023-04-10 DIAGNOSIS — I480 Paroxysmal atrial fibrillation: Secondary | ICD-10-CM | POA: Diagnosis not present

## 2023-04-10 LAB — POCT INR: INR: 2.4 (ref 2.0–3.0)

## 2023-04-10 NOTE — Patient Instructions (Signed)
continue taking Warfarin 1 tablet everyday except 1.5 tablets on Mondays, Wednesdays, and Fridays. Recheck INR in 7 weeks. Coumadin clinic [phone] or [phone].

## 2023-04-16 DIAGNOSIS — I130 Hypertensive heart and chronic kidney disease with heart failure and stage 1 through stage 4 chronic kidney disease, or unspecified chronic kidney disease: Secondary | ICD-10-CM | POA: Diagnosis not present

## 2023-04-16 DIAGNOSIS — Z23 Encounter for immunization: Secondary | ICD-10-CM | POA: Diagnosis not present

## 2023-04-16 DIAGNOSIS — Z0000 Encounter for general adult medical examination without abnormal findings: Secondary | ICD-10-CM | POA: Diagnosis not present

## 2023-04-16 DIAGNOSIS — E1122 Type 2 diabetes mellitus with diabetic chronic kidney disease: Secondary | ICD-10-CM | POA: Diagnosis not present

## 2023-04-16 DIAGNOSIS — I509 Heart failure, unspecified: Secondary | ICD-10-CM | POA: Diagnosis not present

## 2023-04-16 DIAGNOSIS — E782 Mixed hyperlipidemia: Secondary | ICD-10-CM | POA: Diagnosis not present

## 2023-04-16 DIAGNOSIS — N1831 Chronic kidney disease, stage 3a: Secondary | ICD-10-CM | POA: Diagnosis not present

## 2023-05-01 ENCOUNTER — Ambulatory Visit (INDEPENDENT_AMBULATORY_CARE_PROVIDER_SITE_OTHER): Payer: Medicare Other

## 2023-05-01 DIAGNOSIS — I255 Ischemic cardiomyopathy: Secondary | ICD-10-CM | POA: Diagnosis not present

## 2023-05-02 LAB — CUP PACEART REMOTE DEVICE CHECK
Battery Impedance: 2520 Ohm
Battery Remaining Longevity: 30 mo
Battery Voltage: 2.73 V
Brady Statistic AP VP Percent: 0 %
Brady Statistic AP VS Percent: 87 %
Brady Statistic AS VP Percent: 0 %
Brady Statistic AS VS Percent: 12 %
Date Time Interrogation Session: 20241203120251
Implantable Lead Connection Status: 753985
Implantable Lead Connection Status: 753985
Implantable Lead Implant Date: 20020926
Implantable Lead Implant Date: 20020926
Implantable Lead Location: 753859
Implantable Lead Location: 753860
Implantable Lead Model: 5076
Implantable Lead Model: 5092
Implantable Pulse Generator Implant Date: 20120905
Lead Channel Impedance Value: 1044 Ohm
Lead Channel Impedance Value: 522 Ohm
Lead Channel Pacing Threshold Amplitude: 0.625 V
Lead Channel Pacing Threshold Amplitude: 1.125 V
Lead Channel Pacing Threshold Pulse Width: 0.4 ms
Lead Channel Pacing Threshold Pulse Width: 0.4 ms
Lead Channel Setting Pacing Amplitude: 2 V
Lead Channel Setting Pacing Amplitude: 2.5 V
Lead Channel Setting Pacing Pulse Width: 0.4 ms
Lead Channel Setting Sensing Sensitivity: 4 mV
Zone Setting Status: 755011
Zone Setting Status: 755011

## 2023-05-14 DIAGNOSIS — M79632 Pain in left forearm: Secondary | ICD-10-CM | POA: Diagnosis not present

## 2023-05-14 DIAGNOSIS — M79622 Pain in left upper arm: Secondary | ICD-10-CM | POA: Diagnosis not present

## 2023-05-15 DIAGNOSIS — Z23 Encounter for immunization: Secondary | ICD-10-CM | POA: Diagnosis not present

## 2023-05-31 ENCOUNTER — Ambulatory Visit: Payer: Medicare Other | Attending: Internal Medicine

## 2023-05-31 DIAGNOSIS — I480 Paroxysmal atrial fibrillation: Secondary | ICD-10-CM | POA: Diagnosis not present

## 2023-05-31 DIAGNOSIS — Z5181 Encounter for therapeutic drug level monitoring: Secondary | ICD-10-CM

## 2023-05-31 LAB — POCT INR: INR: 2.3 (ref 2.0–3.0)

## 2023-05-31 NOTE — Patient Instructions (Signed)
 Description   Continue taking Warfarin 1 tablet everyday except 1.5 tablets on Mondays, Wednesdays, and Fridays. Recheck INR in 7 weeks.  Coumadin clinic [phone] or [phone].

## 2023-06-25 ENCOUNTER — Other Ambulatory Visit: Payer: Self-pay | Admitting: Cardiology

## 2023-06-25 DIAGNOSIS — I480 Paroxysmal atrial fibrillation: Secondary | ICD-10-CM

## 2023-07-02 ENCOUNTER — Other Ambulatory Visit: Payer: Self-pay | Admitting: Cardiology

## 2023-07-24 ENCOUNTER — Ambulatory Visit: Payer: Medicare Other | Attending: Cardiology

## 2023-07-24 DIAGNOSIS — Z5181 Encounter for therapeutic drug level monitoring: Secondary | ICD-10-CM | POA: Diagnosis not present

## 2023-07-24 DIAGNOSIS — I480 Paroxysmal atrial fibrillation: Secondary | ICD-10-CM

## 2023-07-24 LAB — POCT INR: INR: 2.7 (ref 2.0–3.0)

## 2023-07-24 NOTE — Patient Instructions (Signed)
Continue taking Warfarin 1 tablet everyday except 1.5 tablets on Mondays, Wednesdays, and Fridays. Recheck INR in 6 weeks. Coumadin clinic [phone] or [phone].

## 2023-07-31 ENCOUNTER — Ambulatory Visit (INDEPENDENT_AMBULATORY_CARE_PROVIDER_SITE_OTHER): Payer: Medicare Other

## 2023-07-31 DIAGNOSIS — I480 Paroxysmal atrial fibrillation: Secondary | ICD-10-CM | POA: Diagnosis not present

## 2023-07-31 DIAGNOSIS — I5042 Chronic combined systolic (congestive) and diastolic (congestive) heart failure: Secondary | ICD-10-CM

## 2023-08-02 LAB — CUP PACEART REMOTE DEVICE CHECK
Battery Impedance: 2710 Ohm
Battery Remaining Longevity: 27 mo
Battery Voltage: 2.73 V
Brady Statistic AP VP Percent: 1 %
Brady Statistic AP VS Percent: 87 %
Brady Statistic AS VP Percent: 0 %
Brady Statistic AS VS Percent: 12 %
Date Time Interrogation Session: 20250304120913
Implantable Lead Connection Status: 753985
Implantable Lead Connection Status: 753985
Implantable Lead Implant Date: 20020926
Implantable Lead Implant Date: 20020926
Implantable Lead Location: 753859
Implantable Lead Location: 753860
Implantable Lead Model: 5076
Implantable Lead Model: 5092
Implantable Pulse Generator Implant Date: 20120905
Lead Channel Impedance Value: 1040 Ohm
Lead Channel Impedance Value: 503 Ohm
Lead Channel Pacing Threshold Amplitude: 0.625 V
Lead Channel Pacing Threshold Amplitude: 1.125 V
Lead Channel Pacing Threshold Pulse Width: 0.4 ms
Lead Channel Pacing Threshold Pulse Width: 0.4 ms
Lead Channel Setting Pacing Amplitude: 2 V
Lead Channel Setting Pacing Amplitude: 2.5 V
Lead Channel Setting Pacing Pulse Width: 0.4 ms
Lead Channel Setting Sensing Sensitivity: 4 mV
Zone Setting Status: 755011
Zone Setting Status: 755011

## 2023-08-21 ENCOUNTER — Encounter: Payer: Self-pay | Admitting: Internal Medicine

## 2023-08-21 ENCOUNTER — Ambulatory Visit: Payer: Medicare Other | Attending: Internal Medicine | Admitting: Internal Medicine

## 2023-08-21 VITALS — BP 110/68 | HR 74 | Resp 16 | Ht 67.0 in | Wt 236.8 lb

## 2023-08-21 DIAGNOSIS — I255 Ischemic cardiomyopathy: Secondary | ICD-10-CM | POA: Diagnosis not present

## 2023-08-21 DIAGNOSIS — I4729 Other ventricular tachycardia: Secondary | ICD-10-CM

## 2023-08-21 DIAGNOSIS — R001 Bradycardia, unspecified: Secondary | ICD-10-CM

## 2023-08-21 DIAGNOSIS — I480 Paroxysmal atrial fibrillation: Secondary | ICD-10-CM | POA: Diagnosis not present

## 2023-08-21 NOTE — Progress Notes (Unsigned)
 Patient Care Team: Sigmund Hazel, MD as PCP - General (Family Medicine) [name], MD as PCP - Cardiology (Cardiology) [name], MD as Consulting Physician (Cardiology) Duke Salvia, MD as Consulting Physician (Clinical Cardiac Electrophysiology)   HPI  Brandon Klein is a 83 y.o. male Seen in followup for pacemaker  Medtronic implanted in 09-Sep-2000 with generator replacement Sep 10, 2010. He was implanted initially for bradycardia attributed to vasovagal syncope.  PAF  on coumadin   He has a history of coronary disease and underwent stenting of his LAD 10-Sep-1995 and cutting balloon angioplasty of the circumflex in 09-09-00. CABG 09-09-2013 Interval TAVR   Wife died about 2 years ago.  Married 53 years.  That at the gym 5 days a week now just 3.  Feels like he is living too long.  Ailments back knees orthopedic things.  The patient denies chest pain, shortness of breath, nocturnal dyspnea, orthopnea or peripheral edema.  There have been no palpitations, lightheadedness or syncope.   DATE TEST EF   6/13 ECHO  55%   2/16 LHC  LIMA-LAD;SVG--D; SVG-OM  Patent SVG--PDA 100%  5/16 Echo   25-30%   9/16 Echo  35-40 %   9/17 Echo   40 %   10/18 Myoview  42% No ischemia  9/19 Echo  45% AS mod Grad mean 27  4/23 Echo  40-45%      Thromboembolic risk factors ( age  -2, HTN-1, DM-1, Vasc disease -1, CHF-1) for a CHADSVASc Score of >6   Past Medical History:  Diagnosis Date   Chronic renal insufficiency    Coronary artery disease    a. s/p PCI of LAD 09/10/95. b. Cutting balloon angioplasty to LCx in 2000/09/09. c. s/p cath 11/2013 with severe 3 vessel ASCAD s/p CABG with LIMA to LAD, SVG to diag, SVG to left circ and SVG to PDA. d. cath 07/07/2014 occluded SVG to PDA, all other grafts patent. 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  with benign gastric polypectomy 05/2014.   HLD (hyperlipidemia)    HTN  (hypertension)    Obesity    OSA (obstructive sleep apnea)    Pacemaker    a. s/p pacemaker in 09-Sep-2000 for vasovagal syncope with bradycardia. b. MDT generator replacement 2010/09/10.   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 vasovagal with documented bradycardia.   Type 2 diabetes mellitus without complications (HCC) 12/02/2015    Past Surgical History:  Procedure Laterality Date   ANGIOPLASTY  09/10/95   CHOLECYSTECTOMY     COLONOSCOPY WITH PROPOFOL N/A 05/05/2014   Procedure: COLONOSCOPY WITH PROPOFOL;  Surgeon: Charolett Bumpers, MD;  Location: [facility] ENDOSCOPY;  Service: Endoscopy;  Laterality: N/A;   CORONARY ARTERY BYPASS GRAFT N/A 12/01/2013   Procedure: CORONARY ARTERY BYPASS GRAFT TIMES FOUR USING LEFT INTERNAL MAMMARY ARTERY TO LAD, SAPHENOUS VEIN GRAFTS TO DIAGONAL, CIRCUMFELX AND POSTERIOR DESCENDING;  Surgeon: Kerin Perna, MD;  Location:  [facility] OR;  Service: Open Heart Surgery;  Laterality: N/A;   CORONARY STENT INTERVENTION N/A 03/21/2019   Procedure: CORONARY STENT INTERVENTION;  Surgeon: Kathleene Hazel, MD;  Location: [facility] INVASIVE CV LAB;  Service: Cardiovascular;  Laterality: N/A;  Distal RCA   ESOPHAGOGASTRODUODENOSCOPY (EGD) WITH PROPOFOL N/A 05/05/2014   Procedure: ESOPHAGOGASTRODUODENOSCOPY (EGD) WITH PROPOFOL;  Surgeon: Charolett Bumpers, MD;  Location: [facility] ENDOSCOPY;  Service: Endoscopy;  Laterality: N/A;   KNEE ARTHROSCOPY  2000   Left   LEFT AND RIGHT HEART CATHETERIZATION WITH CORONARY/GRAFT ANGIOGRAM N/A 07/07/2014   Procedure: LEFT AND RIGHT HEART CATHETERIZATION WITH Isabel Caprice;  Surgeon: Kathleene Hazel, MD;  Location[facility] CATH  LAB;  Service: Cardiovascular;  Laterality: N/A;   LEFT HEART CATHETERIZATION WITH CORONARY ANGIOGRAM N/A 11/25/2013   Procedure: LEFT HEART CATHETERIZATION WITH CORONARY ANGIOGRAM;  Surgeon: [name], MD;  Location: [facility] CATH LAB;  Service: Cardiovascular;  Laterality: N/A;   PACEMAKER INSERTION  2002   generator change MDT by Dr Graciela Husbands in 2012   RIGHT/LEFT HEART CATH AND CORONARY/GRAFT ANGIOGRAPHY N/A 03/21/2019   Procedure: RIGHT/LEFT HEART CATH AND CORONARY/GRAFT ANGIOGRAPHY;  Surgeon: Kathleene Hazel, MD;  Location: [facility] INVASIVE CV LAB;  Service: Cardiovascular;  Laterality: N/A;   TEE WITHOUT CARDIOVERSION N/A 08/31/2014   Procedure: TRANSESOPHAGEAL ECHOCARDIOGRAM (TEE);  Surgeon: [name], MD;  Location: [facility] ENDOSCOPY;  Service: Cardiovascular;  Laterality: N/A;   TEE WITHOUT CARDIOVERSION N/A 04/08/2019   Procedure: TRANSESOPHAGEAL ECHOCARDIOGRAM (TEE);  Surgeon: Kathleene Hazel, MD;  Location: [facility] OR;  Service: Open Heart Surgery;  Laterality: N/A;   TRANSCATHETER AORTIC VALVE REPLACEMENT, TRANSFEMORAL  04/08/2019   TRANSCATHETER AORTIC VALVE REPLACEMENT, TRANSFEMORAL N/A 04/08/2019   Procedure: TRANSCATHETER AORTIC VALVE REPLACEMENT, TRANSFEMORAL;  Surgeon: Kathleene Hazel, MD;  Location: [facility] OR;  Service: Open Heart Surgery;  Laterality: N/A;    Current Outpatient Medications  Medication Sig Dispense Refill   Acetaminophen (TYLENOL ARTHRITIS PAIN PO) Take 1,300 mg by mouth 3 (three) times daily as needed.     aspirin 81 MG EC tablet Take 81 mg by mouth daily. Swallow whole.     atorvastatin (LIPITOR) 40 MG tablet TAKE 1 TABLET(40 MG) BY MOUTH DAILY 90 tablet 3   azithromycin (ZITHROMAX) 500 MG tablet TAKE 1 TABLET BY MOUTH 1 HR BEFORE ANY DENTAL WORK INCLUDING CLEANINGS. 6 tablet 6   carvedilol (COREG) 25 MG tablet TAKE 1 TABLET(25 MG) BY MOUTH TWICE DAILY WITH A MEAL 180 tablet 3   colchicine 0.6 MG tablet as directed. Take 1 tablet by mouth twice daily  and 2 tablets at onset of Gout     docusate sodium (COLACE) 100 MG capsule Take 200 mg by mouth daily as needed for mild constipation.      ezetimibe (ZETIA) 10 MG tablet TAKE 1 TABLET(10 MG) BY MOUTH DAILY 90 tablet 2   fexofenadine (ALLEGRA) 180 MG tablet Take 180 mg by mouth daily.      fluticasone (FLONASE) 50 MCG/ACT nasal spray Place 2 sprays into the nose daily.      furosemide (LASIX) 20 MG tablet TAKE 1 TABLET BY MOUTH TWICE DAILY ALONG WITH 80MG  TABLET 180 tablet 1   furosemide (LASIX) 80 MG tablet TAKE 1 TABLET BY MOUTH TWICE DAILY WITH 20MG  180 tablet 1   Glucosamine-Chondroitin 500-400 MG CAPS Take 1 tablet by mouth daily.     hydrALAZINE (APRESOLINE) 25 MG tablet TAKE 1 TABLET(25 MG) BY MOUTH THREE TIMES DAILY 270 tablet 1  isosorbide mononitrate (IMDUR) 30 MG 24 hr tablet TAKE 1 TABLET(30 MG) BY MOUTH DAILY 90 tablet 3   L-Lysine 1000 MG TABS Take 1,000 mg by mouth daily.      meclizine (ANTIVERT) 25 MG tablet Take 25 mg by mouth daily as needed for dizziness.     Multiple Vitamin (MULTIVITAMIN) tablet Take 1 tablet by mouth daily. Centrum silver     nitroGLYCERIN (NITROSTAT) 0.4 MG SL tablet Place 1 tablet (0.4 mg total) under the tongue every 5 (five) minutes as needed for chest pain. 25 tablet 1   ONE TOUCH ULTRA TEST test strip 1 each by Other route every morning.      ONETOUCH DELICA LANCETS FINE MISC 1 each by Other route every morning.      pantoprazole (PROTONIX) 40 MG tablet Take 40 mg by mouth daily.      potassium chloride SA (KLOR-CON M) 20 MEQ tablet TAKE 2 TABLETS(40 MEQ) BY MOUTH TWICE DAILY 360 tablet 1   RA NATURAL MAGNESIUM 250 MG TABS TAKE 2 TABLETS TWICE DAILY. 120 tablet 5   spironolactone (ALDACTONE) 25 MG tablet TAKE 1 TABLET(25 MG) BY MOUTH DAILY 90 tablet 3   warfarin (COUMADIN) 5 MG tablet TAKE 1 TO 1 AND 1/2 TABLETS BY MOUTH DAILY AS DIRECTED BY COUMADIN CLINIC 120 tablet 0   No current facility-administered medications for this visit.    Allergies   Allergen Reactions   Vascepa [Icosapent Ethyl (Epa Ethyl Ester) (Fish)]     Myalgias, TRIED 3 TIMES   Acetaminophen-Codeine Other (See Comments)    Extreme constipation Extreme constipation    Amoxicillin Itching and Rash    Did it involve swelling of the face/tongue/throat, SOB, or low BP? No Did it involve sudden or severe rash/hives, skin peeling, or any reaction on the inside of your mouth or nose? Unknown  Did you need to seek medical attention at a hospital or doctor's office? No When did it last happen?More than 40 years ago        If all above answers are "NO", may proceed with cephalosporin use.    Codeine Nausea And Vomiting   Diovan [Valsartan] Other (See Comments)    Unknown     Review of Systems negative except from HPI and PMH  Physical Exam BP 110/68   Pulse 74   Resp 16   Ht 5\' 7"  (1.702 m)   Wt 236 lb 12.8 oz (107.4 kg)   SpO2 97%   BMI 37.09 kg/m  Well developed and well nourished in no acute distress HENT normal Neck supple with JVP-flat Clear Device pocket well healed; without hematoma or erythema.  There is no tethering  Regular rate and rhythm, no  gallop No  murmur Abd-soft with active BS No Clubbing cyanosis  edema Skin-warm and dry A & Oriented  Grossly normal sensory and motor function  ECG A pacing 28/14/40 RBBB  Device function is normal. Programming changes   See Paceart for details    Assessment and  Plan  Atrial fibrillation paroxysmal  Coronary artery disease with prior PCI and bypass surgery 2015  Pacemaker -Medtronic   T   Congestive heart failure-chronic-class II  Cardiomyopathy question mechanism with concomitant coronary disease  Aortic stenosis-moderate  Sinus node dysfunction   No significant interval atrial fibrillation.  Remains on warfarin. Suspect the aspirin is in conjunction with his coronary disease; not familiar with the data regarding the need for adjunctive aspirin with warfarin versus  NOACs. Reached out to DR  CMac  he would like him to continue on ASA

## 2023-08-21 NOTE — Patient Instructions (Signed)

## 2023-08-28 LAB — CUP PACEART INCLINIC DEVICE CHECK
Battery Impedance: 2820 Ohm
Battery Remaining Longevity: 26 mo
Battery Voltage: 2.73 V
Brady Statistic AP VP Percent: 1 %
Brady Statistic AP VS Percent: 87 %
Brady Statistic AS VP Percent: 0 %
Brady Statistic AS VS Percent: 12 %
Date Time Interrogation Session: 20250325161700
Implantable Lead Connection Status: 753985
Implantable Lead Connection Status: 753985
Implantable Lead Implant Date: 20020926
Implantable Lead Implant Date: 20020926
Implantable Lead Location: 753859
Implantable Lead Location: 753860
Implantable Lead Model: 5076
Implantable Lead Model: 5092
Implantable Pulse Generator Implant Date: 20120905
Lead Channel Impedance Value: 514 Ohm
Lead Channel Impedance Value: 939 Ohm
Lead Channel Pacing Threshold Amplitude: 0.625 V
Lead Channel Pacing Threshold Amplitude: 0.75 V
Lead Channel Pacing Threshold Amplitude: 1 V
Lead Channel Pacing Threshold Amplitude: 1.125 V
Lead Channel Pacing Threshold Pulse Width: 0.4 ms
Lead Channel Pacing Threshold Pulse Width: 0.4 ms
Lead Channel Pacing Threshold Pulse Width: 0.4 ms
Lead Channel Pacing Threshold Pulse Width: 0.4 ms
Lead Channel Sensing Intrinsic Amplitude: 11.2 mV
Lead Channel Setting Pacing Amplitude: 2 V
Lead Channel Setting Pacing Amplitude: 2.5 V
Lead Channel Setting Pacing Pulse Width: 0.4 ms
Lead Channel Setting Sensing Sensitivity: 4 mV
Zone Setting Status: 755011
Zone Setting Status: 755011

## 2023-08-29 ENCOUNTER — Other Ambulatory Visit: Payer: Self-pay | Admitting: Cardiology

## 2023-09-04 ENCOUNTER — Ambulatory Visit: Payer: Medicare Other | Attending: Cardiology | Admitting: *Deleted

## 2023-09-04 DIAGNOSIS — I480 Paroxysmal atrial fibrillation: Secondary | ICD-10-CM | POA: Diagnosis not present

## 2023-09-04 DIAGNOSIS — Z5181 Encounter for therapeutic drug level monitoring: Secondary | ICD-10-CM

## 2023-09-04 LAB — POCT INR: INR: 2 (ref 2.0–3.0)

## 2023-09-04 NOTE — Progress Notes (Signed)
 Remote pacemaker transmission.

## 2023-09-04 NOTE — Addendum Note (Signed)
 Addended by: Geralyn Flash D on: 09/04/2023 09:05 AM   Modules accepted: Orders

## 2023-09-04 NOTE — Patient Instructions (Signed)
 Description   Today take 1.5 tablets of warfarin then continue taking Warfarin 1 tablet everyday except 1.5 tablets on Mondays, Wednesdays, and Fridays. Recheck INR in 6 weeks.  Coumadin clinic [phone] or [phone].     New Address: [address] [place] [address]

## 2023-09-11 ENCOUNTER — Other Ambulatory Visit: Payer: Self-pay | Admitting: Cardiology

## 2023-09-11 DIAGNOSIS — I480 Paroxysmal atrial fibrillation: Secondary | ICD-10-CM

## 2023-09-30 ENCOUNTER — Other Ambulatory Visit: Payer: Self-pay | Admitting: Physician Assistant

## 2023-10-02 ENCOUNTER — Other Ambulatory Visit: Payer: Self-pay

## 2023-10-02 DIAGNOSIS — N1831 Chronic kidney disease, stage 3a: Secondary | ICD-10-CM | POA: Diagnosis not present

## 2023-10-02 DIAGNOSIS — E1122 Type 2 diabetes mellitus with diabetic chronic kidney disease: Secondary | ICD-10-CM | POA: Diagnosis not present

## 2023-10-02 LAB — LAB REPORT - SCANNED: A1c: 6.9

## 2023-10-02 NOTE — Telephone Encounter (Signed)
 Error

## 2023-10-02 NOTE — Telephone Encounter (Signed)
 Pt of Jeronimo Moors [place]. She prescribed this RX and he's not seen her since 2021. Does Jeronimo Moors PA want to refill? Please advise.

## 2023-10-16 ENCOUNTER — Ambulatory Visit: Attending: Cardiology | Admitting: *Deleted

## 2023-10-16 DIAGNOSIS — I480 Paroxysmal atrial fibrillation: Secondary | ICD-10-CM

## 2023-10-16 DIAGNOSIS — Z5181 Encounter for therapeutic drug level monitoring: Secondary | ICD-10-CM | POA: Diagnosis not present

## 2023-10-16 LAB — POCT INR: INR: 2.3 (ref 2.0–3.0)

## 2023-10-16 NOTE — Patient Instructions (Signed)
Description   Continue taking Warfarin 1 tablet everyday except 1.5 tablets on Mondays, Wednesdays, and Fridays. Recheck INR in 6 weeks. Coumadin clinic [phone] or [phone].

## 2023-10-30 ENCOUNTER — Ambulatory Visit: Payer: Medicare Other | Admitting: Cardiology

## 2023-10-30 ENCOUNTER — Ambulatory Visit (INDEPENDENT_AMBULATORY_CARE_PROVIDER_SITE_OTHER): Payer: Medicare Other

## 2023-10-30 DIAGNOSIS — I255 Ischemic cardiomyopathy: Secondary | ICD-10-CM

## 2023-10-31 ENCOUNTER — Ambulatory Visit: Payer: Medicare Other | Attending: Cardiology | Admitting: Cardiology

## 2023-10-31 ENCOUNTER — Encounter: Payer: Self-pay | Admitting: Cardiology

## 2023-10-31 VITALS — BP 95/63 | HR 65 | Ht 67.0 in | Wt 229.8 lb

## 2023-10-31 DIAGNOSIS — I350 Nonrheumatic aortic (valve) stenosis: Secondary | ICD-10-CM

## 2023-10-31 DIAGNOSIS — I255 Ischemic cardiomyopathy: Secondary | ICD-10-CM | POA: Diagnosis not present

## 2023-10-31 DIAGNOSIS — I10 Essential (primary) hypertension: Secondary | ICD-10-CM | POA: Diagnosis not present

## 2023-10-31 DIAGNOSIS — I4729 Other ventricular tachycardia: Secondary | ICD-10-CM | POA: Diagnosis not present

## 2023-10-31 DIAGNOSIS — I480 Paroxysmal atrial fibrillation: Secondary | ICD-10-CM

## 2023-10-31 DIAGNOSIS — E785 Hyperlipidemia, unspecified: Secondary | ICD-10-CM | POA: Diagnosis not present

## 2023-10-31 DIAGNOSIS — I2510 Atherosclerotic heart disease of native coronary artery without angina pectoris: Secondary | ICD-10-CM | POA: Diagnosis not present

## 2023-10-31 DIAGNOSIS — I5042 Chronic combined systolic (congestive) and diastolic (congestive) heart failure: Secondary | ICD-10-CM | POA: Diagnosis not present

## 2023-10-31 DIAGNOSIS — R32 Unspecified urinary incontinence: Secondary | ICD-10-CM

## 2023-10-31 LAB — CUP PACEART REMOTE DEVICE CHECK
Battery Impedance: 3039 Ohm
Battery Remaining Longevity: 24 mo
Battery Voltage: 2.72 V
Brady Statistic AP VP Percent: 1 %
Brady Statistic AP VS Percent: 43 %
Brady Statistic AS VP Percent: 12 %
Brady Statistic AS VS Percent: 45 %
Date Time Interrogation Session: 20250603121128
Implantable Lead Connection Status: 753985
Implantable Lead Connection Status: 753985
Implantable Lead Implant Date: 20020926
Implantable Lead Implant Date: 20020926
Implantable Lead Location: 753859
Implantable Lead Location: 753860
Implantable Lead Model: 5076
Implantable Lead Model: 5092
Implantable Pulse Generator Implant Date: 20120905
Lead Channel Impedance Value: 1034 Ohm
Lead Channel Impedance Value: 514 Ohm
Lead Channel Pacing Threshold Amplitude: 0.625 V
Lead Channel Pacing Threshold Amplitude: 1.125 V
Lead Channel Pacing Threshold Pulse Width: 0.4 ms
Lead Channel Pacing Threshold Pulse Width: 0.4 ms
Lead Channel Setting Pacing Amplitude: 2 V
Lead Channel Setting Pacing Amplitude: 2.5 V
Lead Channel Setting Pacing Pulse Width: 0.4 ms
Lead Channel Setting Sensing Sensitivity: 4 mV
Zone Setting Status: 755011
Zone Setting Status: 755011

## 2023-10-31 NOTE — Patient Instructions (Signed)
 Medication Instructions:  Your physician recommends that you continue on your current medications as directed. Please refer to the Current Medication list given to you today.  *If you need a refill on your cardiac medications before your next appointment, please call your pharmacy*  Lab Work: Please ask your PCP to send us  a copy of your most recent lab work, including your recent creatinine level.   If you have labs (blood work) drawn today and your tests are completely normal, you will receive your results only by: MyChart Message (if you have MyChart) OR A paper copy in the mail If you have any lab test that is abnormal or we need to change your treatment, we will call you to review the results.  Testing/Procedures: Your physician has requested that you have an echocardiogram. Echocardiography is a painless test that uses sound waves to create images of your heart. It provides your doctor with information about the size and shape of your heart and how well your heart's chambers and valves are working. This procedure takes approximately one hour. There are no restrictions for this procedure. Please do NOT wear cologne, perfume, aftershave, or lotions (deodorant is allowed). Please arrive 15 minutes prior to your appointment time.  Please note: We ask at that you not bring children with you during ultrasound (echo/ vascular) testing. Due to room size and safety concerns, children are not allowed in the ultrasound rooms during exams. Our front office staff cannot provide observation of children in our lobby area while testing is being conducted. An adult accompanying a patient to their appointment will only be allowed in the ultrasound room at the discretion of the ultrasound technician under special circumstances. We apologize for any inconvenience.   Follow-Up: At [facility], you and your health needs are our priority.  As part of our continuing mission to provide you with  exceptional heart care, our providers are all part of one team.  This team includes your primary Cardiologist (physician) and Advanced Practice Providers or APPs (Physician Assistants and Nurse Practitioners) who all work together to provide you with the care you need, when you need it.  Your next appointment:   1 year(s)  Provider:   Gaylyn Keas, MD    We recommend signing up for the patient portal called "MyChart".  Sign up information is provided on this After Visit Summary.  MyChart is used to connect with patients for Virtual Visits (Telemedicine).  Patients are able to view lab/test results, encounter notes, upcoming appointments, etc.  Non-urgent messages can be sent to your provider as well.   To learn more about what you can do with MyChart, go to [email or web address].   Other Instructions Dr. [name] Adas has referred you to Urology. Someone will call you to set up an appointment.

## 2023-10-31 NOTE — Progress Notes (Signed)
 Cardiology Office Note:    Date:  10/31/2023   ID:  Brandon Klein, DOB August 11, 1940, MRN [id number]  PCP:  Brandon Bradley, MD  Cardiologist:  Gaylyn Keas, MD    Referring MD: Brandon Bradley, MD   Chief Complaint  Patient presents with   Coronary Artery Disease   Hypertension   Hyperlipidemia   Congestive Heart Failure   Cardiomyopathy   Aortic Stenosis    History of Present Illness:    Brandon Klein is a 83 y.o. male with a hx of carotid artery disease, CAD s/p 4V CABG (2015 PVT) s/p PCI to dRCA (10/23), chronic systolic CHF, NICM, HTN, HLD, borderline DM on no therapy, PAF on coumadin , CKD stage II, sleep apnea, bradycardia s/p PPM and severe AS s/p TAVR (04/08/19).   He has a hx of CAD and underwent 4V CABG in 2015. He has had a pacemaker implanted remotely for bradycardia and syncope. He has paroxysmal atrial fibrillation and is on coumadin . His aortic stenosis has been followed for several years. Echo 01/31/19 showed LVEF=45-50% with severe AS.  Cardiac cath 03/21/19 with 3/4 patent bypass grafts. The SVG to the PDA is known to be occluded. A drug eluting stent was placed in the distal RCA.    He underwent successful TAVR with a 29 mm Edwards Sapien 3 THV via the TF approach on 04/08/19. Post operative echo showed EF 50-55% with normally functioning TAVR with a mean gradient of 12 mm Hg and no PVL. He has done well in follow up.  He is here today for followup and is doing well.  He tells me that every spring he gets SOB that he thinks is related to allergies. He takes chronic allergy  medication. He is still having itchy eyes as well.  He denies any chest pain or pressure,  PND, orthopnea, LE edema, dizziness, palpitations or syncope. He is compliant with HIS meds and is tolerating meds with no SE.     Past Medical History:  Diagnosis Date   Chronic renal insufficiency    Coronary artery disease    a. s/p PCI of LAD 1997. b. Cutting balloon angioplasty to LCx in 2002. c. s/p cath 11/2013  with severe 3 vessel ASCAD s/p CABG with LIMA to LAD, SVG to diag, SVG to left circ and SVG to PDA. d. cath 07/07/2014 occluded SVG to PDA, all other grafts patent. 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  with benign gastric polypectomy 05/2014.   HLD (hyperlipidemia)    HTN (hypertension)    Obesity    OSA (obstructive sleep apnea)    Pacemaker    a. 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 b. MDT generator replacement 2012.   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 vasovagal with documented bradycardia.   Type 2 diabetes mellitus without complications (HCC) 12/02/2015    Past Surgical History:  Procedure Laterality Date   ANGIOPLASTY  1997   CHOLECYSTECTOMY  COLONOSCOPY WITH PROPOFOL  N/A 05/05/2014   Procedure: COLONOSCOPY WITH PROPOFOL ;  Surgeon: Garrett Kallman, MD;  Location: [facility] ENDOSCOPY;  Service: Endoscopy;  Laterality: N/A;   CORONARY ARTERY BYPASS GRAFT N/A 12/01/2013   Procedure: CORONARY ARTERY BYPASS GRAFT TIMES FOUR USING LEFT INTERNAL MAMMARY ARTERY TO LAD, SAPHENOUS VEIN GRAFTS TO DIAGONAL, CIRCUMFELX AND POSTERIOR DESCENDING;  Surgeon: Heriberto London, MD;  Location: [facility] OR;  Service: Open Heart Surgery;  Laterality: N/A;   CORONARY STENT INTERVENTION N/A 03/21/2019   Procedure: CORONARY STENT INTERVENTION;  Surgeon: Odie Benne, MD;  Location: [facility] INVASIVE CV LAB;  Service: Cardiovascular;  Laterality: N/A;  Distal RCA   ESOPHAGOGASTRODUODENOSCOPY (EGD) WITH PROPOFOL  N/A 05/05/2014   Procedure:  ESOPHAGOGASTRODUODENOSCOPY (EGD) WITH PROPOFOL ;  Surgeon: Garrett Kallman, MD;  Location: [facility] ENDOS[facility];  Service: Endoscopy;  Laterality: N/A;   KNEE ARTHROSCOPY  2000   Left   LEFT AND RIGHT HEART CATHETERIZATION WITH CORONARY/GRAFT ANGIOGRAM N/A 07/07/2014   Procedure: LEFT AND RIGHT HEART CATHETERIZATION WITH Estella Helling;  Surgeon: Odie Benne, MD;  Location: [facility] CATH LAB;  Service: Cardiovascular;  Laterality: N/A;   LEFT HEART CATHETERIZATION WITH CORONARY ANGIOGRAM N/A 11/25/2013   Procedure: LEFT HEART CATHETERIZATION WITH CORONARY ANGIOGRAM;  Surgeon: Jacqueline Matsu, MD;  Location: [facility] CATH LAB;  Service: Cardiovascular;  Laterality: N/A;   PACEMAKER INSERTION  2002   generator change MDT by Dr Rodolfo Clan in 2012   RIGHT/LEFT HEART CATH AND CORONARY/GRAFT ANGIOGRAPHY N/A 03/21/2019   Procedure: RIGHT/LEFT HEART CATH AND CORONARY/GRAFT ANGIOGRAPHY;  Surgeon: Odie Benne, MD;  Location: [facility] INVAS[facility] CV LAB;  Service: Cardiovascular;  Laterality: N/A;   TEE WITHOUT CARDIOVERSION N/A 08/31/2014   Procedure: TRANSESOPHAGEAL ECHOCARDIOGRAM (TEE);  Surgeon: Jacqueline Matsu, MD;  Location: [facility] ENDOSCOPY;  Service: Cardiovascular;  Laterality: N/A;   TEE WITHOUT CARDIOVERSION N/A 04/08/2019   Procedure: TRANSESOPHAGEAL ECHOCARDIOGRAM (TEE);  Surgeon: Odie Benne, MD;  Location: [facility] OR;  Service: Open Heart Surgery;  Laterality: N/A;   TRANSCATHETER AORTIC VALVE REPLACEMENT, TRANSFEMORAL  04/08/2019   TRANSCATHETER AORTIC VALVE REPLACEMENT, TRANSFEMORAL N/A 04/08/2019   Procedure: TRANSCATHETER AORTIC VALVE REPLACEMENT, TRANSFEMORAL;  Surgeon: Odie Benne, MD;  Location: [facility] OR;  Service: Open Heart Surgery;  Laterality: N/A;    Current Medications: Current Meds  Medication Sig   Acetaminophen  (TYLENOL  ARTHRITIS PAIN PO) Take 1,300 mg by mouth 3 (three) times daily as needed.   aspirin  81 MG EC tablet Take 81 mg by mouth daily. Swallow whole.    atorvastatin  (LIPITOR ) 40 MG tablet TAKE 1 TABLET(40 MG) BY MOUTH DAILY   azithromycin  (ZITHROMAX ) 500 MG tablet TAKE 1 TABLET BY MOUTH 1 HOURS PRIOR TO ANY DENTAL WORK INCLUDING CLEANINGS   carvedilol  (COREG ) 25 MG tablet TAKE 1 TABLET(25 MG) BY MOUTH TWICE DAILY WITH A MEAL   colchicine 0.6 MG tablet as directed. Take 1 tablet by mouth twice daily and 2 tablets at onset of Gout   docusate sodium  (COLACE) 100 MG capsule Take 200 mg by mouth daily as needed for mild constipation.    ezetimibe  (ZETIA ) 10 MG tablet TAKE 1 TABLET(10 MG) BY MOUTH DAILY   fexofenadine (ALLEGRA) 180 MG tablet Take 180 mg by mouth daily.    fluticasone  (FLONASE ) 50 MCG/ACT nasal spray Place 2 sprays into the nose daily.    furosemide  (LASIX ) 20 MG tablet TAKE 1 TABLET BY MOUTH TWICE DAILY ALONG WITH 80MG  TABLET   furosemide  (LASIX ) 80 MG tablet TAKE 1 TABLET BY MOUTH TWICE  DAILY WITH 20MG    Glucosamine-Chondroitin 500-400 MG CAPS Take 1 tablet by mouth daily.   hydrALAZINE  (APRESOLINE ) 25 MG tablet TAKE 1 TABLET(25 MG) BY MOUTH THREE TIMES DAILY   isosorbide  mononitrate (IMDUR ) 30 MG 24 hr tablet TAKE 1 TABLET(30 MG) BY MOUTH DAILY   L-Lysine 1000 MG TABS Take 1,000 mg by mouth daily.    meclizine (ANTIVERT) 25 MG tablet Take 25 mg by mouth daily as needed for dizziness.   Multiple Vitamin (MULTIVITAMIN) tablet Take 1 tablet by mouth daily. Centrum silver   nitroGLYCERIN  (NITROSTAT ) 0.4 MG SL tablet Place 1 tablet (0.4 mg total) under the tongue every 5 (five) minutes as needed for chest pain.   ONE TOUCH ULTRA TEST test strip 1 each by Other route every morning.    ONETOUCH DELICA LANCETS FINE MISC 1 each by Other route every morning.    pantoprazole  (PROTONIX ) 40 MG tablet Take 40 mg by mouth daily.    potassium chloride  SA (KLOR-CON  M) 20 MEQ tablet TAKE 2 TABLETS(40 MEQ) BY MOUTH TWICE DAILY   RA NATURAL MAGNESIUM  250 MG TABS TAKE 2 TABLETS TWICE DAILY.   spironolactone  (ALDACTONE ) 25 MG tablet TAKE 1 TABLET(25  MG) BY MOUTH DAILY   warfarin (COUMADIN ) 5 MG tablet TAKE 1 TO 1 AND 1/2 TABLETS BY MOUTH DAILY AS DIRECTED BY COUMADIN  CLINIC     Allergies:   Icosapent  ethyl (epa ethyl ester) (fish), Acetaminophen -codeine, Amoxicillin, Codeine, and Valsartan   Social History   Socioeconomic History   Marital status: Married    Spouse name: Not on file   Number of children: Not on file   Years of education: Not on file   Highest education level: Not on file  Occupational History   Not on file  Tobacco Use   Smoking status: Former    Current packs/day: 0.00    Average packs/day: 2.0 packs/day for 5.0 years (10.0 ttl pk-yrs)    Types: Cigarettes    Start date: 05/29/1961    Quit date: 05/29/1966    Years since quitting: 57.4   Smokeless tobacco: Never  Vaping Use   Vaping status: Never Used  Substance and Sexual Activity   Alcohol use: Yes    Alcohol/week: 0.0 standard drinks of alcohol    Comment: ocassional   Drug use: No   Sexual activity: Not on file  Other Topics Concern   Not on file  Social History Narrative   Lives in Home with spouse.  Retired Medical illustrator   Social Drivers of Corporate investment banker Strain: Not on [employer] Insecurity: Not on file  Transportation Needs: Not on file  Physical Activity: Not on file  Stress: Not on file  Social Connections: Unknown (10/11/2021)   [facility]   Social Network    Social Network: Not on file     Family History: The patient's family history includes Colon cancer in his mother; Heart attack in his brother and mother; Leukemia in his father.  ROS:   Please see the history of present illness.    ROS  All other systems reviewed and negative.   EKGs/Labs/Other Studies Reviewed:    The following studies were reviewed today: 2D echo 08/2021 IMPRESSIONS    1. Left ventricular ejection fraction, by estimation, is 40 to 45%. The  left ventricle has mildly decreased function. The left ventricle   demonstrates global hypokinesis. There is mild concentric left ventricular  hypertrophy. Left ventricular diastolic  parameters are consistent  with Grade I diastolic dysfunction (impaired  relaxation).   2. Right ventricular systolic function is mildly reduced. The right  ventricular size is mildly enlarged. There is normal pulmonary artery  systolic pressure. The estimated right ventricular systolic pressure is  25.3 mmHg.   3. Left atrial size was mildly dilated.   4. Right atrial size was mildly dilated.   5. The mitral valve is normal in structure. Trivial mitral valve  regurgitation. No evidence of mitral stenosis.   6. The aortic valve has been repaired/replaced. There is a 29 mm Sapien  prosthetic (TAVR) valve present in the aortic position. Echo findings are  consistent with normal structure and function of the aortic valve  prosthesis. AoV mean gradient 7.14mmHg,  Vmax 1.39m/s, DI 0.44. There is trivial paravalvular leak.   7. Aortic dilatation noted. There is borderline dilatation of the  ascending aorta, measuring 37 mm.   8. The inferior vena cava is normal in size with greater than 50%  respiratory variability, suggesting right atrial pressure of 3 mmHg.   Comparison(s): Compared to prior TTE in 01/2020, there is no significant  change. Prior mean AoV gradient was 8.41mmHg.   Recent Labs: No results found for requested labs within last 365 days.   Recent Lipid Panel    Component Value Date/Time   CHOL 131 07/11/2022 1118   CHOL 113 07/22/2014 1226   TRIG 190 (H) 07/11/2022 1118   TRIG 91 07/22/2014 1226   HDL 47 07/11/2022 1118   HDL 40 07/22/2014 1226   CHOLHDL 2.8 07/11/2022 1118   CHOLHDL 3.4 06/04/2015 1216   VLDL 39 (H) 06/04/2015 1216   LDLCALC 53 07/11/2022 1118   LDLCALC 55 07/22/2014 1226   LDLDIRECT 47.9 07/14/2013 0937    Physical Exam:    VS:  BP 95/63 (BP Location: Left Arm, Patient Position: Sitting, Cuff Size: Large)   Pulse 65   Ht 5\' 7"   (1.702 m)   Wt 229 lb 12.8 oz (104.2 kg)   SpO2 93%   BMI 35.99 kg/m     Wt Readings from Last 3 Encounters:  10/31/23 229 lb 12.8 oz (104.2 kg)  08/21/23 236 lb 12.8 oz (107.4 kg)  11/03/22 232 lb 12.8 oz (105.6 kg)    GEN: Well nourished, well developed in no acute distress HEENT: Normal NECK: No JVD; No carotid bruits LYMPHATICS: No lymphadenopathy CARDIAC:RRR, no murmurs, rubs, gallops RESPIRATORY:  Clear to auscultation without rales, wheezing or rhonchi  ABDOMEN: Soft, non-tender, non-distended MUSCULOSKELETAL:  No edema; No deformity  SKIN: Warm and dry NEUROLOGIC:  Alert and oriented x 3 PSYCHIATRIC:  Normal affect  ASSESSMENT:    1. Nonrheumatic aortic valve stenosis   2. Coronary artery disease involving native coronary artery of native heart without angina pectoris   3. PAF (paroxysmal atrial fibrillation) (HCC)   4. Chronic combined systolic and diastolic CHF (congestive heart failure) (HCC)   5. Ischemic cardiomyopathy   6. NSVT (nonsustained ventricular tachycardia) (HCC)   7. Primary hypertension   8. Dyslipidemia     PLAN:    In order of problems listed above:  #Severe AS -29 mm Edwards Sapien 3 THV via the TF approach on 04/08/19.  -followed in Structural heart clinic -He is following SBE prophylaxis for dental procedures -2D echo 09/15/2021 showed EF 40 to 45% with stable TAVR with mean aortic valve gradient 7.2 mmHg and trivial perivalvular leak -Since he is now 5 years out from his TAVR so I will repeat 2D echo  #  ASCAD -s/p 4V CABG 2015  -Cardiac cath 03/21/19 with 3/4 patent bypass grafts. The SVG to the PDA is known to be occluded. A drug eluting stent was placed in the distal RCA. -He denies anginal symptoms since he was seen last -Continue aspirin  81 mg daily, carvedilol  25 mg twice daily, Imdur  30 mg daily and Zetia  10 mg daily as well as atorvastatin  40 mg daily with as needed refills  #PAF - He is maintaining normal sinus rhythm on exam  today. - He denies any bleeding problems on warfarin and denies any palpitations - Continue carvedilol  25 mg twice daily and warfarin - INR's/warfarin managed by Coumadin  clinic  #Chronic combined S/D CHF #Ischemic cardiomyopathy  #Nonsustained ventricular tachycardia -Appears euvolemic on exam today -Continue carvedilol  25 mg twice daily, Lasix  100 mg twice daily, hydralazine  25 mg 3 times daily, Imdur  30 mg daily, spironolactone  25 mg daily with as needed refills -I have personally reviewed and interpreted outside labs performed by patient's PCP which showed hemoglobin 16.6 on 11/18 2024 -2D echo 08/2021 showed stable EF at 40 to 45% unchanged from 2021 -titration of GDMT limited by soft BP -he has had some problems with incontinence>>likely related to diuretics but may have issues with not emptying his bladder completely so will refer to Urology  #HTN - BP controlled on exam today - Continue carvedilol  25 mg twice daily, hydralazine  25 mg 3 times daily, spironolactone  25 mg daily with as needed refills - I will get a copy of BMET from PCP  #HLD -LDL goal < 70 -I have personally reviewed and interpreted outside labs performed by patient's PCP which showed LDL 50, HDL 40 and ALT 22 on 04/16/2023 -Continue Zetia  10 mg daily and atorvastatin  40 mg daily with as needed refills  Medication Adjustments/Labs and Tests Ordered: Current medicines are reviewed at length with the patient today.  Concerns regarding medicines are outlined above.  No orders of the defined types were placed in this encounter.  No orders of the defined types were placed in this encounter.  Followup with me in 1 year  Signed, Gaylyn Keas, MD  10/31/2023 11:20 AM    [facility]

## 2023-11-06 ENCOUNTER — Other Ambulatory Visit: Payer: Self-pay

## 2023-11-06 ENCOUNTER — Ambulatory Visit: Payer: Self-pay | Admitting: Cardiology

## 2023-11-06 DIAGNOSIS — I5042 Chronic combined systolic (congestive) and diastolic (congestive) heart failure: Secondary | ICD-10-CM

## 2023-11-06 DIAGNOSIS — R32 Unspecified urinary incontinence: Secondary | ICD-10-CM

## 2023-11-06 NOTE — Progress Notes (Signed)
 Updated urology referral to urologist of choice.

## 2023-11-06 NOTE — Telephone Encounter (Signed)
 Call to patient to discuss lab results. Patient verbalizes understanding of stable labs. Results forwarded to PCP.

## 2023-11-06 NOTE — Telephone Encounter (Signed)
-----   Message from Gaylyn Keas sent at 11/06/2023  9:08 AM EDT ----- Stable labs - continue current meds and forward to PCP

## 2023-11-08 ENCOUNTER — Other Ambulatory Visit: Payer: Self-pay | Admitting: Cardiology

## 2023-11-08 DIAGNOSIS — I480 Paroxysmal atrial fibrillation: Secondary | ICD-10-CM

## 2023-11-09 ENCOUNTER — Other Ambulatory Visit: Payer: Self-pay

## 2023-11-09 ENCOUNTER — Ambulatory Visit: Payer: Self-pay | Admitting: Cardiology

## 2023-11-09 MED ORDER — EZETIMIBE 10 MG PO TABS
10.0000 mg | ORAL_TABLET | Freq: Every day | ORAL | 3 refills | Status: AC
Start: 2023-11-09 — End: ?

## 2023-11-27 ENCOUNTER — Ambulatory Visit: Attending: Cardiology | Admitting: *Deleted

## 2023-11-27 DIAGNOSIS — I480 Paroxysmal atrial fibrillation: Secondary | ICD-10-CM | POA: Diagnosis not present

## 2023-11-27 DIAGNOSIS — Z5181 Encounter for therapeutic drug level monitoring: Secondary | ICD-10-CM | POA: Diagnosis not present

## 2023-11-27 LAB — POCT INR: INR: 3.2 — AB (ref 2.0–3.0)

## 2023-11-27 NOTE — Progress Notes (Signed)
Please see anticoagulation encounter.

## 2023-11-27 NOTE — Patient Instructions (Addendum)
 Description   Today take 1/2 tablet of warfarin then continue taking Warfarin 1 tablet everyday except 1.5 tablets on Mondays, Wednesdays, and Fridays. Recheck INR in 5 weeks.  Coumadin  clinic # [phone].

## 2023-11-28 DIAGNOSIS — Z23 Encounter for immunization: Secondary | ICD-10-CM | POA: Diagnosis not present

## 2023-11-29 ENCOUNTER — Other Ambulatory Visit (HOSPITAL_BASED_OUTPATIENT_CLINIC_OR_DEPARTMENT_OTHER): Payer: Self-pay | Admitting: Internal Medicine

## 2023-12-10 ENCOUNTER — Ambulatory Visit (HOSPITAL_COMMUNITY)
Admission: RE | Admit: 2023-12-10 | Discharge: 2023-12-10 | Disposition: A | Discharge status: Home / Self Care | Source: Ambulatory Visit | Attending: Internal Medicine | Admitting: Internal Medicine

## 2023-12-10 VITALS — BP 100/62 | HR 62 | Ht 67.0 in | Wt 225.6 lb

## 2023-12-10 DIAGNOSIS — I480 Paroxysmal atrial fibrillation: Secondary | ICD-10-CM | POA: Diagnosis not present

## 2023-12-10 DIAGNOSIS — D6869 Other thrombophilia: Secondary | ICD-10-CM

## 2023-12-10 DIAGNOSIS — I4891 Unspecified atrial fibrillation: Secondary | ICD-10-CM

## 2023-12-10 NOTE — Progress Notes (Signed)
 Primary Care Physician: Cleotilde Planas, MD Primary Cardiologist: Wilbert Bihari, MD Electrophysiologist: None     Referring Physician: Device clinic     Brandon Klein is a 83 y.o. male with a history of CAD s/p stent, HFrEF, HTN, history of GI bleed, s/p TAVR, OSA, T2DM, and atrial fibrillation who presents for consultation in the [facility]. Remote PPM interrogation on 6/13 by Dr. Inocencio showed patient in Afib. Patient is on coumadin  for a CHADS2VASC score of 6.  On evaluation today, he is currently in AV paced rhythm. Patient is unsure as to exact etiology of Afib increase since April. He is surprised about increase in Afib burden; he does not have cardiac awareness. He does not have caffeine as part of his diet and maybe has 1 alcoholic beverage weekly. He notes to have a diagnosis of OSA but could not tolerate wearing the mask.   Today, he denies symptoms of palpitations, chest pain, shortness of breath, orthopnea, PND, lower extremity edema, dizziness, presyncope, syncope, snoring, daytime somnolence, bleeding, or neurologic sequela. The patient is tolerating medications without difficulties and is otherwise without complaint today.    he has a BMI of Body mass index is 35.33 kg/m.SABRA Filed Weights   12/10/23 1347  Weight: 102.3 kg    Current Outpatient Medications  Medication Sig Dispense Refill   atorvastatin  (LIPITOR ) 40 MG tablet TAKE 1 TABLET(40 MG) BY MOUTH DAILY 90 tablet 3   azithromycin  (ZITHROMAX ) 500 MG tablet TAKE 1 TABLET BY MOUTH 1 HOURS PRIOR TO ANY DENTAL WORK INCLUDING CLEANINGS 12 tablet 6   carvedilol  (COREG ) 25 MG tablet TAKE 1 TABLET(25 MG) BY MOUTH TWICE DAILY WITH A MEAL 180 tablet 3   colchicine 0.6 MG tablet as directed. Take 1 tablet by mouth twice daily and 2 tablets at onset of Gout     docusate sodium  (COLACE) 100 MG capsule Take 200 mg by mouth daily as needed for mild constipation.      ezetimibe  (ZETIA ) 10 MG tablet Take  1 tablet (10 mg total) by mouth daily. 90 tablet 3   fexofenadine (ALLEGRA) 180 MG tablet Take 180 mg by mouth daily.      fluticasone  (FLONASE ) 50 MCG/ACT nasal spray Place 2 sprays into the nose daily.      furosemide  (LASIX ) 20 MG tablet TAKE 1 TABLET BY MOUTH TWICE DAILY ALONG WITH 80MG  TABLET 180 tablet 3   furosemide  (LASIX ) 80 MG tablet TAKE 1 TABLET BY MOUTH TWICE DAILY WITH 20MG  180 tablet 3   Glucosamine-Chondroitin 500-400 MG CAPS Take 1 tablet by mouth daily.     hydrALAZINE  (APRESOLINE ) 25 MG tablet TAKE 1 TABLET(25 MG) BY MOUTH THREE TIMES DAILY 270 tablet 3   isosorbide  mononitrate (IMDUR ) 30 MG 24 hr tablet TAKE 1 TABLET(30 MG) BY MOUTH DAILY 90 tablet 3   L-Lysine 1000 MG TABS Take 1,000 mg by mouth daily.      meclizine (ANTIVERT) 25 MG tablet Take 25 mg by mouth daily as needed for dizziness.     Multiple Vitamin (MULTIVITAMIN) tablet Take 1 tablet by mouth daily. Centrum silver     nitroGLYCERIN  (NITROSTAT ) 0.4 MG SL tablet Place 1 tablet (0.4 mg total) under the tongue every 5 (five) minutes as needed for chest pain. 25 tablet 1   ONE TOUCH ULTRA TEST test strip 1 each by Other route every morning.      ONETOUCH DELICA LANCETS FINE MISC 1 each by Other route every morning.  pantoprazole  (PROTONIX ) 40 MG tablet Take 40 mg by mouth daily.      potassium chloride  SA (KLOR-CON  M) 20 MEQ tablet TAKE 2 TABLETS(40 MEQ) BY MOUTH TWICE DAILY 360 tablet 3   RA NATURAL MAGNESIUM  250 MG TABS TAKE 2 TABLETS TWICE DAILY. 120 tablet 5   spironolactone  (ALDACTONE ) 25 MG tablet TAKE 1 TABLET(25 MG) BY MOUTH DAILY 90 tablet 3   warfarin (COUMADIN ) 5 MG tablet TAKE 1 TO 1 AND 1/2 TABLETS BY MOUTH DAILY AS DIRECTED BY COUMADIN  CLINIC 120 tablet 0   Acetaminophen  (TYLENOL  ARTHRITIS PAIN PO) Take 1,300 mg by mouth 3 (three) times daily as needed.     aspirin  81 MG EC tablet Take 81 mg by mouth daily. Swallow whole.     No current facility-administered medications for this encounter.     Atrial Fibrillation Management history:  Previous antiarrhythmic drugs: none Previous cardioversions: none Previous ablations: none Anticoagulation history: coumadin    ROS- All systems are reviewed and negative except as per the HPI above.  Physical Exam: BP 100/62   Pulse 62   Ht 5' 7 (1.702 m)   Wt 102.3 kg   BMI 35.33 kg/m   GEN: Well nourished, well developed in no acute distress NECK: No JVD; No carotid bruits CARDIAC: Regular rate and rhythm, no murmurs, rubs, gallops RESPIRATORY:  Clear to auscultation without rales, wheezing or rhonchi  ABDOMEN: Soft, non-tender, non-distended EXTREMITIES:  No edema; No deformity   EKG today demonstrates  Vent. rate 62 BPM PR interval 260 ms QRS duration 142 ms QT/QTcB 422/428 ms P-R-T axes -9 -68 10 Atrial-paced rhythm with prolonged AV conduction Right bundle branch block Left anterior fascicular block Bifascicular block Abnormal ECG When compared with ECG of 21-Aug-2023 15:40, No significant change was found  Echo 09/06/21 demonstrated  1. Left ventricular ejection fraction, by estimation, is 40 to 45%. The  left ventricle has mildly decreased function. The left ventricle  demonstrates global hypokinesis. There is mild concentric left ventricular  hypertrophy. Left ventricular diastolic  parameters are consistent with Grade I diastolic dysfunction (impaired  relaxation).   2. Right ventricular systolic function is mildly reduced. The right  ventricular size is mildly enlarged. There is normal pulmonary artery  systolic pressure. The estimated right ventricular systolic pressure is  25.3 mmHg.   3. Left atrial size was mildly dilated.   4. Right atrial size was mildly dilated.   5. The mitral valve is normal in structure. Trivial mitral valve  regurgitation. No evidence of mitral stenosis.   6. The aortic valve has been repaired/replaced. There is a 29 mm Sapien  prosthetic (TAVR) valve present in the aortic  position. Echo findings are  consistent with normal structure and function of the aortic valve  prosthesis. AoV mean gradient 7.37mmHg,  Vmax 1.37m/s, DI 0.44. There is trivial paravalvular leak.   7. Aortic dilatation noted. There is borderline dilatation of the  ascending aorta, measuring 37 mm.   8. The inferior vena cava is normal in size with greater than 50%  respiratory variability, suggesting right atrial pressure of 3 mmHg.   Comparison(s): Compared to prior TTE in 01/2020, there is no significant  change. Prior mean AoV gradient was 8.32mmHg.   ASSESSMENT & PLAN CHA2DS2-VASc Score = 6  The patient's score is based upon: CHF History: 1 HTN History: 1 Diabetes History: 1 Stroke History: 0 Vascular Disease History: 1 Age Score: 2 Gender Score: 0       ASSESSMENT AND PLAN: Paroxysmal  Atrial Fibrillation (ICD10:  I48.0) The patient's CHA2DS2-VASc score is 6, indicating a 9.7% annual risk of stroke.    He is currently in AV paced rhythm. We had a discussion about medication treatments and ablation in detail. We discussed potential options such as Tikosyn, and amiodarone . We talked about the monitoring required for these medications, hospital admission for Tikosyn, and potential adverse effects. After discussion, patient would like to have follow up appt in a few months to look again at Afib burden trend. I did advise patient due to his mildly decreased LVEF if next PPM check shows significant burden my recommendation would be to begin treatment as noted above. He understands this.  Qtc 428 ms CrCl 73 mL/min  Secondary Hypercoagulable State (ICD10:  D68.69) The patient is at significant risk for stroke/thromboembolism based upon his CHA2DS2-VASc Score of 6.  Continue Warfarin (Coumadin ).  Patient is on coumadin .    Follow up September Afib clinic.    Terra Pac, PA-C  Afib Clinic [facility] [address] [place], [place] [address] [phone]

## 2023-12-17 ENCOUNTER — Ambulatory Visit (HOSPITAL_COMMUNITY)
Admission: RE | Admit: 2023-12-17 | Discharge: 2023-12-17 | Disposition: A | Discharge status: Home / Self Care | Source: Ambulatory Visit | Attending: Cardiology | Admitting: Cardiology

## 2023-12-17 ENCOUNTER — Ambulatory Visit: Payer: Self-pay | Admitting: Cardiology

## 2023-12-17 DIAGNOSIS — I255 Ischemic cardiomyopathy: Secondary | ICD-10-CM | POA: Diagnosis not present

## 2023-12-17 DIAGNOSIS — I5042 Chronic combined systolic (congestive) and diastolic (congestive) heart failure: Secondary | ICD-10-CM | POA: Insufficient documentation

## 2023-12-17 DIAGNOSIS — I350 Nonrheumatic aortic (valve) stenosis: Secondary | ICD-10-CM | POA: Insufficient documentation

## 2023-12-17 DIAGNOSIS — I480 Paroxysmal atrial fibrillation: Secondary | ICD-10-CM | POA: Insufficient documentation

## 2023-12-17 LAB — ECHOCARDIOGRAM COMPLETE
AR max vel: 2.05 cm2
AV Area VTI: 2.29 cm2
AV Area mean vel: 1.73 cm2
AV Mean grad: 6 mmHg
AV Peak grad: 8.8 mmHg
Ao pk vel: 1.48 m/s
Area-P 1/2: 2.57 cm2
S' Lateral: 4.5 cm

## 2023-12-18 DIAGNOSIS — M62830 Muscle spasm of back: Secondary | ICD-10-CM | POA: Diagnosis not present

## 2023-12-20 NOTE — Telephone Encounter (Signed)
 Call to patient to advise 2D echo showed mildly reduced function EF 45-50%, mild right atrial enlargement, trivial leakiness of the mitral valve, stable TAVR with mean aortic valve gradient normal at 5 mmHg. Compared to echo 2023 heart function is slightly improved. Patient verbalizes understanding.

## 2023-12-20 NOTE — Telephone Encounter (Signed)
-----   Message from Brandon Klein sent at 12/17/2023  5:36 PM EDT ----- 2D echo showed mildly reduced function EF 45-50%, mild right atrial enlargement, trivial leakiness of the mitral valve, stable TAVR with mean aortic valve gradient normal at 5 mmHg.  Compared to echo  2023 heart function is slightly improved ----- Message ----- From: Interface, [employer] Sent: 12/17/2023   5:14 PM EDT To: Brandon JONELLE Bihari, MD

## 2023-12-25 NOTE — Addendum Note (Signed)
 Addended by: VICCI SELLER A on: 12/25/2023 01:38 PM   Modules accepted: Orders

## 2023-12-25 NOTE — Progress Notes (Signed)
 Remote pacemaker transmission.

## 2023-12-27 DIAGNOSIS — E782 Mixed hyperlipidemia: Secondary | ICD-10-CM | POA: Diagnosis not present

## 2023-12-27 DIAGNOSIS — I25119 Atherosclerotic heart disease of native coronary artery with unspecified angina pectoris: Secondary | ICD-10-CM | POA: Diagnosis not present

## 2023-12-27 DIAGNOSIS — E1159 Type 2 diabetes mellitus with other circulatory complications: Secondary | ICD-10-CM | POA: Diagnosis not present

## 2023-12-27 DIAGNOSIS — N1832 Chronic kidney disease, stage 3b: Secondary | ICD-10-CM | POA: Diagnosis not present

## 2024-01-01 ENCOUNTER — Ambulatory Visit: Attending: Cardiology | Admitting: *Deleted

## 2024-01-01 DIAGNOSIS — I480 Paroxysmal atrial fibrillation: Secondary | ICD-10-CM

## 2024-01-01 DIAGNOSIS — Z5181 Encounter for therapeutic drug level monitoring: Secondary | ICD-10-CM | POA: Diagnosis not present

## 2024-01-01 LAB — POCT INR: INR: 2.5 (ref 2.0–3.0)

## 2024-01-01 NOTE — Progress Notes (Signed)
 INR 2.5. Please see anticoagulation encounter

## 2024-01-01 NOTE — Patient Instructions (Signed)
 Description   Continue taking Warfarin 1 tablet everyday except 1.5 tablets on Mondays, Wednesdays, and Fridays. Recheck INR in 6 weeks.  Coumadin  clinic # [phone].

## 2024-01-09 DIAGNOSIS — R350 Frequency of micturition: Secondary | ICD-10-CM | POA: Diagnosis not present

## 2024-01-15 DIAGNOSIS — T148XXA Other injury of unspecified body region, initial encounter: Secondary | ICD-10-CM | POA: Diagnosis not present

## 2024-01-27 DIAGNOSIS — N1832 Chronic kidney disease, stage 3b: Secondary | ICD-10-CM | POA: Diagnosis not present

## 2024-01-27 DIAGNOSIS — I25119 Atherosclerotic heart disease of native coronary artery with unspecified angina pectoris: Secondary | ICD-10-CM | POA: Diagnosis not present

## 2024-01-27 DIAGNOSIS — E782 Mixed hyperlipidemia: Secondary | ICD-10-CM | POA: Diagnosis not present

## 2024-01-27 DIAGNOSIS — E1159 Type 2 diabetes mellitus with other circulatory complications: Secondary | ICD-10-CM | POA: Diagnosis not present

## 2024-01-28 ENCOUNTER — Other Ambulatory Visit: Payer: Self-pay | Admitting: Cardiology

## 2024-01-28 DIAGNOSIS — I480 Paroxysmal atrial fibrillation: Secondary | ICD-10-CM

## 2024-01-29 ENCOUNTER — Ambulatory Visit (INDEPENDENT_AMBULATORY_CARE_PROVIDER_SITE_OTHER): Payer: Medicare Other

## 2024-01-29 DIAGNOSIS — I480 Paroxysmal atrial fibrillation: Secondary | ICD-10-CM

## 2024-01-31 ENCOUNTER — Ambulatory Visit: Payer: Self-pay | Admitting: Cardiology

## 2024-01-31 DIAGNOSIS — J3489 Other specified disorders of nose and nasal sinuses: Secondary | ICD-10-CM | POA: Diagnosis not present

## 2024-01-31 DIAGNOSIS — E1169 Type 2 diabetes mellitus with other specified complication: Secondary | ICD-10-CM | POA: Diagnosis not present

## 2024-01-31 LAB — CUP PACEART REMOTE DEVICE CHECK
Battery Impedance: 3072 Ohm
Battery Remaining Longevity: 23 mo
Battery Voltage: 2.71 V
Brady Statistic AP VP Percent: 1 %
Brady Statistic AP VS Percent: 79 %
Brady Statistic AS VP Percent: 5 %
Brady Statistic AS VS Percent: 16 %
Date Time Interrogation Session: 20250902125218
Implantable Lead Connection Status: 753985
Implantable Lead Connection Status: 753985
Implantable Lead Implant Date: 20020926
Implantable Lead Implant Date: 20020926
Implantable Lead Location: 753859
Implantable Lead Location: 753860
Implantable Lead Model: 5076
Implantable Lead Model: 5092
Implantable Pulse Generator Implant Date: 20120905
Lead Channel Impedance Value: 1030 Ohm
Lead Channel Impedance Value: 532 Ohm
Lead Channel Pacing Threshold Amplitude: 0.625 V
Lead Channel Pacing Threshold Amplitude: 1.125 V
Lead Channel Pacing Threshold Pulse Width: 0.4 ms
Lead Channel Pacing Threshold Pulse Width: 0.4 ms
Lead Channel Setting Pacing Amplitude: 2 V
Lead Channel Setting Pacing Amplitude: 2.5 V
Lead Channel Setting Pacing Pulse Width: 0.4 ms
Lead Channel Setting Sensing Sensitivity: 4 mV
Zone Setting Status: 755011
Zone Setting Status: 755011

## 2024-02-05 ENCOUNTER — Ambulatory Visit (HOSPITAL_COMMUNITY)
Admission: RE | Admit: 2024-02-05 | Discharge: 2024-02-05 | Disposition: A | Discharge status: Home / Self Care | Source: Ambulatory Visit | Attending: Internal Medicine | Admitting: Internal Medicine

## 2024-02-05 ENCOUNTER — Encounter (HOSPITAL_COMMUNITY): Payer: Self-pay | Admitting: Internal Medicine

## 2024-02-05 VITALS — BP 102/68 | HR 65 | Ht 67.0 in | Wt 218.4 lb

## 2024-02-05 DIAGNOSIS — D6869 Other thrombophilia: Secondary | ICD-10-CM

## 2024-02-05 DIAGNOSIS — I480 Paroxysmal atrial fibrillation: Secondary | ICD-10-CM | POA: Diagnosis not present

## 2024-02-05 NOTE — Progress Notes (Signed)
 Primary Care Physician: Cleotilde Planas, MD Primary Cardiologist: Wilbert Bihari, MD Electrophysiologist: None     Referring Physician: Device clinic     Brandon Klein is a 83 y.o. male with a history of CAD s/p stent, HFrEF, HTN, history of GI bleed, s/p TAVR, OSA, T2DM, and atrial fibrillation who presents for consultation in the [facility]. Remote PPM interrogation on 6/13 by Dr. Inocencio showed patient in Afib. Patient is on coumadin  for a CHADS2VASC score of 6.  On evaluation today, he is currently in AV paced rhythm. Patient is unsure as to exact etiology of Afib increase since April. He is surprised about increase in Afib burden; he does not have cardiac awareness. He does not have caffeine as part of his diet and maybe has 1 alcoholic beverage weekly. He notes to have a diagnosis of OSA but could not tolerate wearing the mask.   On follow up 02/05/24, patient is currently in A paced rhythm. Review of device check by Dr. Inocencio on 01/31/24 shows overall improvement in Afib burden since ~middle of June with no episodes since then which is reassuring.   Today, he denies symptoms of palpitations, chest pain, shortness of breath, orthopnea, PND, lower extremity edema, dizziness, presyncope, syncope, snoring, daytime somnolence, bleeding, or neurologic sequela. The patient is tolerating medications without difficulties and is otherwise without complaint today.    he has a BMI of Body mass index is 34.21 kg/m.SABRA Filed Weights   02/05/24 1330  Weight: 99.1 kg     Current Outpatient Medications  Medication Sig Dispense Refill   Acetaminophen  (TYLENOL  ARTHRITIS PAIN PO) Take 1,300 mg by mouth 3 (three) times daily as needed.     aspirin  81 MG EC tablet Take 81 mg by mouth daily. Swallow whole.     atorvastatin  (LIPITOR ) 40 MG tablet TAKE 1 TABLET(40 MG) BY MOUTH DAILY 90 tablet 3   azithromycin  (ZITHROMAX ) 500 MG tablet TAKE 1 TABLET BY MOUTH 1 HOURS PRIOR TO ANY  DENTAL WORK INCLUDING CLEANINGS 12 tablet 6   carvedilol  (COREG ) 25 MG tablet TAKE 1 TABLET(25 MG) BY MOUTH TWICE DAILY WITH A MEAL 180 tablet 3   colchicine 0.6 MG tablet as directed. Take 1 tablet by mouth twice daily and 2 tablets at onset of Gout     docusate sodium  (COLACE) 100 MG capsule Take 200 mg by mouth daily as needed for mild constipation.      ezetimibe  (ZETIA ) 10 MG tablet Take 1 tablet (10 mg total) by mouth daily. 90 tablet 3   fexofenadine (ALLEGRA) 180 MG tablet Take 180 mg by mouth daily.      fluticasone  (FLONASE ) 50 MCG/ACT nasal spray Place 2 sprays into the nose daily.      furosemide  (LASIX ) 20 MG tablet TAKE 1 TABLET BY MOUTH TWICE DAILY ALONG WITH 80MG  TABLET 180 tablet 3   furosemide  (LASIX ) 80 MG tablet TAKE 1 TABLET BY MOUTH TWICE DAILY WITH 20MG  180 tablet 3   Glucosamine-Chondroitin 500-400 MG CAPS Take 1 tablet by mouth daily.     hydrALAZINE  (APRESOLINE ) 25 MG tablet TAKE 1 TABLET(25 MG) BY MOUTH THREE TIMES DAILY 270 tablet 3   isosorbide  mononitrate (IMDUR ) 30 MG 24 hr tablet TAKE 1 TABLET(30 MG) BY MOUTH DAILY 90 tablet 3   L-Lysine 1000 MG TABS Take 1,000 mg by mouth daily.      meclizine (ANTIVERT) 25 MG tablet Take 25 mg by mouth daily as needed for dizziness.  Multiple Vitamin (MULTIVITAMIN) tablet Take 1 tablet by mouth daily. Centrum silver     nitroGLYCERIN  (NITROSTAT ) 0.4 MG SL tablet Place 1 tablet (0.4 mg total) under the tongue every 5 (five) minutes as needed for chest pain. 25 tablet 1   ONE TOUCH ULTRA TEST test strip 1 each by Other route every morning.      ONETOUCH DELICA LANCETS FINE MISC 1 each by Other route every morning.      pantoprazole  (PROTONIX ) 40 MG tablet Take 40 mg by mouth daily.      potassium chloride  SA (KLOR-CON  M) 20 MEQ tablet TAKE 2 TABLETS(40 MEQ) BY MOUTH TWICE DAILY 360 tablet 3   RA NATURAL MAGNESIUM  250 MG TABS TAKE 2 TABLETS TWICE DAILY. 120 tablet 5   spironolactone  (ALDACTONE ) 25 MG tablet TAKE 1 TABLET(25 MG)  BY MOUTH DAILY 90 tablet 3   tamsulosin (FLOMAX) 0.4 MG CAPS capsule Take 0.4 mg by mouth daily.     warfarin (COUMADIN ) 5 MG tablet TAKE 1 TO 1 AND 1/2 TABLETS BY MOUTH DAILY AS DIRECTED BY COUMADIN  CLINIC 120 tablet 0   No current facility-administered medications for this encounter.    Atrial Fibrillation Management history:  Previous antiarrhythmic drugs: none Previous cardioversions: none Previous ablations: none Anticoagulation history: coumadin    ROS- All systems are reviewed and negative except as per the HPI above.  Physical Exam: BP 102/68   Pulse 65   Ht 5' 7 (1.702 m)   Wt 99.1 kg   BMI 34.21 kg/m   GEN- The patient is well appearing, alert and oriented x 3 today.   Neck - no JVD or carotid bruit noted Lungs- Clear to ausculation bilaterally, normal work of breathing Heart- Regular rate and rhythm, no murmurs, rubs or gallops, PMI not laterally displaced Extremities- no clubbing, cyanosis, or edema Skin - no rash or ecchymosis noted   EKG today demonstrates  Vent. rate 65 BPM PR interval 246 ms QRS duration 140 ms QT/QTcB 404/420 ms P-R-T axes * -44 11 Atrial-paced rhythm with prolonged AV conduction Left axis deviation Right bundle branch block Abnormal ECG When compared with ECG of 10-Dec-2023 13:59, No significant change was found  Echo 09/06/21 demonstrated  1. Left ventricular ejection fraction, by estimation, is 40 to 45%. The  left ventricle has mildly decreased function. The left ventricle  demonstrates global hypokinesis. There is mild concentric left ventricular  hypertrophy. Left ventricular diastolic  parameters are consistent with Grade I diastolic dysfunction (impaired  relaxation).   2. Right ventricular systolic function is mildly reduced. The right  ventricular size is mildly enlarged. There is normal pulmonary artery  systolic pressure. The estimated right ventricular systolic pressure is  25.3 mmHg.   3. Left atrial size was  mildly dilated.   4. Right atrial size was mildly dilated.   5. The mitral valve is normal in structure. Trivial mitral valve  regurgitation. No evidence of mitral stenosis.   6. The aortic valve has been repaired/replaced. There is a 29 mm Sapien  prosthetic (TAVR) valve present in the aortic position. Echo findings are  consistent with normal structure and function of the aortic valve  prosthesis. AoV mean gradient 7.96mmHg,  Vmax 1.2m/s, DI 0.44. There is trivial paravalvular leak.   7. Aortic dilatation noted. There is borderline dilatation of the  ascending aorta, measuring 37 mm.   8. The inferior vena cava is normal in size with greater than 50%  respiratory variability, suggesting right atrial pressure of 3 mmHg.  Comparison(s): Compared to prior TTE in 01/2020, there is no significant  change. Prior mean AoV gradient was 8.69mmHg.   ASSESSMENT & PLAN CHA2DS2-VASc Score = 6  The patient's score is based upon: CHF History: 1 HTN History: 1 Diabetes History: 1 Stroke History: 0 Vascular Disease History: 1 Age Score: 2 Gender Score: 0       ASSESSMENT AND PLAN: Paroxysmal Atrial Fibrillation (ICD10:  I48.0) The patient's CHA2DS2-VASc score is 6, indicating a 9.7% annual risk of stroke.    He is currently in A paced rhythm. Review of most recent device check is reassuring with no Afib burden since June. Continue conservative observation via subsequent device checks.    Secondary Hypercoagulable State (ICD10:  D68.69) The patient is at significant risk for stroke/thromboembolism based upon his CHA2DS2-VASc Score of 6.  Continue Warfarin (Coumadin ).  Continue coumadin  as directed.     Follow up 6 months Afib clinic.    Terra Pac, PA-C  Afib Clinic [facility] [address] [place], [place] [address] [phone]

## 2024-02-06 NOTE — Progress Notes (Signed)
 Remote PPM Transmission

## 2024-02-11 ENCOUNTER — Other Ambulatory Visit (HOSPITAL_BASED_OUTPATIENT_CLINIC_OR_DEPARTMENT_OTHER): Payer: Self-pay | Admitting: Internal Medicine

## 2024-02-12 ENCOUNTER — Ambulatory Visit: Attending: Cardiology | Admitting: *Deleted

## 2024-02-12 DIAGNOSIS — Z5181 Encounter for therapeutic drug level monitoring: Secondary | ICD-10-CM | POA: Diagnosis not present

## 2024-02-12 DIAGNOSIS — I480 Paroxysmal atrial fibrillation: Secondary | ICD-10-CM | POA: Diagnosis not present

## 2024-02-12 LAB — POCT INR: INR: 2.5 (ref 2.0–3.0)

## 2024-02-12 NOTE — Patient Instructions (Signed)
 Description   INR-2.5; Continue taking Warfarin 1 tablet everyday except 1.5 tablets on Mondays, Wednesdays, and Fridays. Recheck INR in 6 weeks.  Coumadin  clinic # [phone].

## 2024-02-12 NOTE — Progress Notes (Signed)
 Description   INR-2.5; Continue taking Warfarin 1 tablet everyday except 1.5 tablets on Mondays, Wednesdays, and Fridays. Recheck INR in 6 weeks.  Coumadin  clinic # [phone].

## 2024-02-14 DIAGNOSIS — R350 Frequency of micturition: Secondary | ICD-10-CM | POA: Diagnosis not present

## 2024-02-22 DIAGNOSIS — J3089 Other allergic rhinitis: Secondary | ICD-10-CM | POA: Diagnosis not present

## 2024-02-22 DIAGNOSIS — J301 Allergic rhinitis due to pollen: Secondary | ICD-10-CM | POA: Diagnosis not present

## 2024-02-22 DIAGNOSIS — H1045 Other chronic allergic conjunctivitis: Secondary | ICD-10-CM | POA: Diagnosis not present

## 2024-02-26 DIAGNOSIS — I25119 Atherosclerotic heart disease of native coronary artery with unspecified angina pectoris: Secondary | ICD-10-CM | POA: Diagnosis not present

## 2024-02-26 DIAGNOSIS — E782 Mixed hyperlipidemia: Secondary | ICD-10-CM | POA: Diagnosis not present

## 2024-02-26 DIAGNOSIS — E1159 Type 2 diabetes mellitus with other circulatory complications: Secondary | ICD-10-CM | POA: Diagnosis not present

## 2024-02-26 DIAGNOSIS — N1832 Chronic kidney disease, stage 3b: Secondary | ICD-10-CM | POA: Diagnosis not present

## 2024-03-25 ENCOUNTER — Ambulatory Visit: Attending: Cardiology | Admitting: *Deleted

## 2024-03-25 DIAGNOSIS — I480 Paroxysmal atrial fibrillation: Secondary | ICD-10-CM

## 2024-03-25 DIAGNOSIS — Z5181 Encounter for therapeutic drug level monitoring: Secondary | ICD-10-CM

## 2024-03-25 LAB — POCT INR: INR: 2.2 (ref 2.0–3.0)

## 2024-03-25 NOTE — Progress Notes (Signed)
 Description   INR-2.2; Continue taking Warfarin 1 tablet everyday except 1.5 tablets on Mondays, Wednesdays, and Fridays. Recheck INR in 6 weeks.  Coumadin  clinic # [phone].

## 2024-03-25 NOTE — Patient Instructions (Signed)
 Description   INR-2.2; Continue taking Warfarin 1 tablet everyday except 1.5 tablets on Mondays, Wednesdays, and Fridays. Recheck INR in 6 weeks.  Coumadin  clinic # [phone].

## 2024-04-18 ENCOUNTER — Other Ambulatory Visit: Payer: Self-pay | Admitting: Cardiology

## 2024-04-18 DIAGNOSIS — I480 Paroxysmal atrial fibrillation: Secondary | ICD-10-CM

## 2024-04-18 NOTE — Telephone Encounter (Signed)
 Warfarin 5mg  refill Afib Last INR 03/25/24 Last OV 02/05/24 Afib Clinic

## 2024-04-29 ENCOUNTER — Ambulatory Visit: Payer: Medicare Other

## 2024-04-29 DIAGNOSIS — I480 Paroxysmal atrial fibrillation: Secondary | ICD-10-CM

## 2024-04-30 LAB — CUP PACEART REMOTE DEVICE CHECK
Battery Impedance: 3465 Ohm
Battery Remaining Longevity: 20 mo
Battery Voltage: 2.72 V
Brady Statistic AP VP Percent: 0 %
Brady Statistic AP VS Percent: 85 %
Brady Statistic AS VP Percent: 2 %
Brady Statistic AS VS Percent: 12 %
Date Time Interrogation Session: 20251202122111
Implantable Lead Connection Status: 753985
Implantable Lead Connection Status: 753985
Implantable Lead Implant Date: 20020926
Implantable Lead Implant Date: 20020926
Implantable Lead Location: 753859
Implantable Lead Location: 753860
Implantable Lead Model: 5076
Implantable Lead Model: 5092
Implantable Pulse Generator Implant Date: 20120905
Lead Channel Impedance Value: 1060 Ohm
Lead Channel Impedance Value: 527 Ohm
Lead Channel Pacing Threshold Amplitude: 0.625 V
Lead Channel Pacing Threshold Amplitude: 1.125 V
Lead Channel Pacing Threshold Pulse Width: 0.4 ms
Lead Channel Pacing Threshold Pulse Width: 0.4 ms
Lead Channel Setting Pacing Amplitude: 2 V
Lead Channel Setting Pacing Amplitude: 2.5 V
Lead Channel Setting Pacing Pulse Width: 0.4 ms
Lead Channel Setting Sensing Sensitivity: 4 mV
Zone Setting Status: 755011
Zone Setting Status: 755011

## 2024-05-01 ENCOUNTER — Ambulatory Visit: Payer: Self-pay | Admitting: Cardiology

## 2024-05-02 NOTE — Progress Notes (Signed)
 Remote PPM Transmission

## 2024-05-06 ENCOUNTER — Ambulatory Visit: Attending: Cardiology | Admitting: *Deleted

## 2024-05-06 DIAGNOSIS — Z5181 Encounter for therapeutic drug level monitoring: Secondary | ICD-10-CM

## 2024-05-06 DIAGNOSIS — I480 Paroxysmal atrial fibrillation: Secondary | ICD-10-CM

## 2024-05-06 LAB — POCT INR: INR: 2.3 (ref 2.0–3.0)

## 2024-05-06 NOTE — Progress Notes (Signed)
 Description   INR-2.3; Continue taking Warfarin 1 tablet everyday except 1.5 tablets on Mondays, Wednesdays, and Fridays. Recheck INR in 6 weeks.  Coumadin  clinic # [phone].

## 2024-05-06 NOTE — Patient Instructions (Signed)
 Description   INR-2.3; Continue taking Warfarin 1 tablet everyday except 1.5 tablets on Mondays, Wednesdays, and Fridays. Recheck INR in 6 weeks.  Coumadin  clinic # [phone].

## 2024-06-17 ENCOUNTER — Ambulatory Visit: Attending: Cardiology | Admitting: Pharmacist

## 2024-06-17 DIAGNOSIS — I480 Paroxysmal atrial fibrillation: Secondary | ICD-10-CM

## 2024-06-17 DIAGNOSIS — Z952 Presence of prosthetic heart valve: Secondary | ICD-10-CM

## 2024-06-17 DIAGNOSIS — Z7901 Long term (current) use of anticoagulants: Secondary | ICD-10-CM | POA: Insufficient documentation

## 2024-06-17 LAB — POCT INR: INR: 2.1 (ref 2.0–3.0)

## 2024-06-17 NOTE — Patient Instructions (Signed)
 Description   INR-2.1; Continue taking Warfarin 1 tablet everyday except 1.5 tablets on Mondays, Wednesdays, and Fridays. Recheck INR in 6 weeks.  Coumadin  clinic # [phone].

## 2024-06-17 NOTE — Progress Notes (Signed)
 Description   INR-2.1; Continue taking Warfarin 1 tablet everyday except 1.5 tablets on Mondays, Wednesdays, and Fridays. Recheck INR in 6 weeks.  Coumadin  clinic # [phone].

## 2024-07-29 ENCOUNTER — Ambulatory Visit

## 2024-07-31 ENCOUNTER — Ambulatory Visit (HOSPITAL_COMMUNITY): Admitting: Internal Medicine

## 2024-07-31 ENCOUNTER — Ambulatory Visit

## 2024-08-07 ENCOUNTER — Ambulatory Visit (HOSPITAL_COMMUNITY): Admitting: Internal Medicine
